# Patient Record
Sex: Female | Born: 1935 | Race: White | Hispanic: No | Smoking: Former smoker
Health system: Southern US, Community
[De-identification: ages and names within clinical notes are randomized; demographics above are authoritative.]

## PROBLEM LIST (undated history)

## (undated) DIAGNOSIS — E785 Hyperlipidemia, unspecified: Secondary | ICD-10-CM

## (undated) DIAGNOSIS — H269 Unspecified cataract: Secondary | ICD-10-CM

## (undated) DIAGNOSIS — N393 Stress incontinence (female) (male): Secondary | ICD-10-CM

## (undated) DIAGNOSIS — N3020 Other chronic cystitis without hematuria: Secondary | ICD-10-CM

## (undated) DIAGNOSIS — I4891 Unspecified atrial fibrillation: Secondary | ICD-10-CM

## (undated) DIAGNOSIS — K921 Melena: Secondary | ICD-10-CM

## (undated) DIAGNOSIS — I2510 Atherosclerotic heart disease of native coronary artery without angina pectoris: Secondary | ICD-10-CM

## (undated) DIAGNOSIS — N183 Chronic kidney disease, stage 3 unspecified: Secondary | ICD-10-CM

## (undated) DIAGNOSIS — K219 Gastro-esophageal reflux disease without esophagitis: Secondary | ICD-10-CM

## (undated) DIAGNOSIS — T8859XA Other complications of anesthesia, initial encounter: Secondary | ICD-10-CM

## (undated) DIAGNOSIS — I10 Essential (primary) hypertension: Secondary | ICD-10-CM

## (undated) DIAGNOSIS — I739 Peripheral vascular disease, unspecified: Secondary | ICD-10-CM

## (undated) DIAGNOSIS — K5520 Angiodysplasia of colon without hemorrhage: Secondary | ICD-10-CM

## (undated) DIAGNOSIS — D500 Iron deficiency anemia secondary to blood loss (chronic): Secondary | ICD-10-CM

## (undated) DIAGNOSIS — F329 Major depressive disorder, single episode, unspecified: Secondary | ICD-10-CM

## (undated) DIAGNOSIS — D509 Iron deficiency anemia, unspecified: Secondary | ICD-10-CM

## (undated) DIAGNOSIS — I779 Disorder of arteries and arterioles, unspecified: Secondary | ICD-10-CM

## (undated) DIAGNOSIS — E119 Type 2 diabetes mellitus without complications: Secondary | ICD-10-CM

## (undated) DIAGNOSIS — Z972 Presence of dental prosthetic device (complete) (partial): Secondary | ICD-10-CM

## (undated) DIAGNOSIS — F32A Depression, unspecified: Secondary | ICD-10-CM

## (undated) DIAGNOSIS — M1990 Unspecified osteoarthritis, unspecified site: Secondary | ICD-10-CM

## (undated) DIAGNOSIS — T4145XA Adverse effect of unspecified anesthetic, initial encounter: Secondary | ICD-10-CM

## (undated) HISTORY — DX: Hyperlipidemia, unspecified: E78.5

## (undated) HISTORY — DX: Angiodysplasia of colon without hemorrhage: K55.20

## (undated) HISTORY — DX: Disorder of arteries and arterioles, unspecified: I77.9

## (undated) HISTORY — DX: Essential (primary) hypertension: I10

## (undated) HISTORY — DX: Iron deficiency anemia, unspecified: D50.9

## (undated) HISTORY — DX: Melena: K92.1

## (undated) HISTORY — DX: Unspecified cataract: H26.9

## (undated) HISTORY — DX: Gastro-esophageal reflux disease without esophagitis: K21.9

## (undated) HISTORY — DX: Stress incontinence (female) (male): N39.3

## (undated) HISTORY — PX: CARPAL TUNNEL RELEASE: SHX101

## (undated) HISTORY — DX: Iron deficiency anemia secondary to blood loss (chronic): D50.0

## (undated) HISTORY — DX: Other chronic cystitis without hematuria: N30.20

## (undated) HISTORY — DX: Depression, unspecified: F32.A

## (undated) HISTORY — DX: Major depressive disorder, single episode, unspecified: F32.9

## (undated) HISTORY — DX: Type 2 diabetes mellitus without complications: E11.9

## (undated) HISTORY — DX: Unspecified osteoarthritis, unspecified site: M19.90

## (undated) HISTORY — DX: Atherosclerotic heart disease of native coronary artery without angina pectoris: I25.10

## (undated) HISTORY — DX: Peripheral vascular disease, unspecified: I73.9

## (undated) MED FILL — Ferumoxytol Inj 510 MG/17ML (30 MG/ML) (Elemental Fe): INTRAVENOUS | Qty: 17 | Status: AC

---
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
## 1970-11-19 HISTORY — PX: ABDOMINAL HYSTERECTOMY: SHX81

## 1990-11-19 HISTORY — PX: APPENDECTOMY: SHX54

## 1991-11-20 HISTORY — PX: CHOLECYSTECTOMY: SHX55

## 2001-02-17 HISTORY — PX: CARDIAC CATHETERIZATION: SHX172

## 2002-09-19 HISTORY — PX: CAROTID ARTERY ANGIOPLASTY: SHX1300

## 2004-12-13 ENCOUNTER — Ambulatory Visit: Payer: Self-pay | Admitting: Specialist

## 2005-01-10 ENCOUNTER — Ambulatory Visit: Payer: Self-pay | Admitting: Internal Medicine

## 2005-01-17 ENCOUNTER — Ambulatory Visit: Payer: Self-pay | Admitting: Internal Medicine

## 2005-02-17 ENCOUNTER — Ambulatory Visit: Payer: Self-pay | Admitting: Internal Medicine

## 2005-03-19 ENCOUNTER — Ambulatory Visit: Payer: Self-pay | Admitting: Internal Medicine

## 2005-06-18 ENCOUNTER — Emergency Department: Payer: Self-pay | Admitting: Emergency Medicine

## 2005-06-18 ENCOUNTER — Other Ambulatory Visit: Payer: Self-pay

## 2005-06-20 ENCOUNTER — Inpatient Hospital Stay: Payer: Self-pay | Admitting: Internal Medicine

## 2005-07-26 ENCOUNTER — Ambulatory Visit: Payer: Self-pay | Admitting: Internal Medicine

## 2005-08-03 ENCOUNTER — Other Ambulatory Visit: Payer: Self-pay

## 2005-08-04 ENCOUNTER — Inpatient Hospital Stay: Payer: Self-pay | Admitting: Internal Medicine

## 2005-09-20 ENCOUNTER — Ambulatory Visit: Payer: Self-pay | Admitting: Internal Medicine

## 2005-10-25 ENCOUNTER — Ambulatory Visit: Payer: Self-pay | Admitting: Internal Medicine

## 2005-10-31 ENCOUNTER — Ambulatory Visit: Payer: Self-pay | Admitting: Unknown Physician Specialty

## 2005-11-19 ENCOUNTER — Ambulatory Visit: Payer: Self-pay | Admitting: Internal Medicine

## 2005-12-27 ENCOUNTER — Ambulatory Visit: Payer: Self-pay | Admitting: Internal Medicine

## 2006-01-01 ENCOUNTER — Ambulatory Visit: Payer: Self-pay | Admitting: Internal Medicine

## 2006-01-17 ENCOUNTER — Ambulatory Visit: Payer: Self-pay | Admitting: Internal Medicine

## 2006-09-16 ENCOUNTER — Other Ambulatory Visit: Payer: Self-pay

## 2006-09-26 ENCOUNTER — Other Ambulatory Visit: Payer: Self-pay

## 2006-09-26 ENCOUNTER — Inpatient Hospital Stay: Payer: Self-pay | Admitting: Unknown Physician Specialty

## 2006-09-27 ENCOUNTER — Other Ambulatory Visit: Payer: Self-pay

## 2007-01-29 ENCOUNTER — Ambulatory Visit: Payer: Self-pay | Admitting: Internal Medicine

## 2007-06-20 HISTORY — PX: FOOT SURGERY: SHX648

## 2007-07-09 ENCOUNTER — Ambulatory Visit: Payer: Self-pay | Admitting: Podiatry

## 2008-04-08 ENCOUNTER — Ambulatory Visit: Payer: Self-pay | Admitting: Unknown Physician Specialty

## 2008-04-21 ENCOUNTER — Ambulatory Visit: Payer: Self-pay | Admitting: Internal Medicine

## 2008-05-12 ENCOUNTER — Ambulatory Visit: Payer: Self-pay | Admitting: Pain Medicine

## 2008-05-27 ENCOUNTER — Ambulatory Visit: Payer: Self-pay | Admitting: Pain Medicine

## 2008-06-10 ENCOUNTER — Ambulatory Visit: Payer: Self-pay | Admitting: Physician Assistant

## 2008-06-19 LAB — HM DIABETES FOOT EXAM

## 2008-07-29 ENCOUNTER — Ambulatory Visit: Payer: Self-pay | Admitting: Unknown Physician Specialty

## 2008-09-10 ENCOUNTER — Ambulatory Visit: Payer: Self-pay | Admitting: Unknown Physician Specialty

## 2008-09-20 ENCOUNTER — Ambulatory Visit: Payer: Self-pay | Admitting: Pain Medicine

## 2008-09-23 ENCOUNTER — Ambulatory Visit: Payer: Self-pay | Admitting: Pain Medicine

## 2008-10-20 ENCOUNTER — Ambulatory Visit: Payer: Self-pay | Admitting: Physician Assistant

## 2008-11-24 ENCOUNTER — Ambulatory Visit: Payer: Self-pay | Admitting: Internal Medicine

## 2008-12-16 ENCOUNTER — Ambulatory Visit: Payer: Self-pay | Admitting: Internal Medicine

## 2008-12-20 ENCOUNTER — Ambulatory Visit: Payer: Self-pay | Admitting: Internal Medicine

## 2009-01-11 ENCOUNTER — Ambulatory Visit: Payer: Self-pay | Admitting: Internal Medicine

## 2009-01-17 ENCOUNTER — Ambulatory Visit: Payer: Self-pay | Admitting: Internal Medicine

## 2009-02-22 ENCOUNTER — Ambulatory Visit: Payer: Self-pay | Admitting: Internal Medicine

## 2009-03-19 ENCOUNTER — Ambulatory Visit: Payer: Self-pay | Admitting: Internal Medicine

## 2009-04-19 ENCOUNTER — Ambulatory Visit: Payer: Self-pay | Admitting: Internal Medicine

## 2009-04-26 ENCOUNTER — Ambulatory Visit: Payer: Self-pay | Admitting: Urology

## 2009-05-05 ENCOUNTER — Ambulatory Visit: Payer: Self-pay | Admitting: Urology

## 2009-05-19 ENCOUNTER — Ambulatory Visit: Payer: Self-pay | Admitting: Urology

## 2009-06-09 ENCOUNTER — Ambulatory Visit: Payer: Self-pay | Admitting: Internal Medicine

## 2009-06-14 ENCOUNTER — Ambulatory Visit: Payer: Self-pay | Admitting: Pain Medicine

## 2009-06-17 ENCOUNTER — Ambulatory Visit: Payer: Self-pay | Admitting: Internal Medicine

## 2009-06-19 ENCOUNTER — Ambulatory Visit: Payer: Self-pay | Admitting: Internal Medicine

## 2009-06-28 ENCOUNTER — Ambulatory Visit: Payer: Self-pay | Admitting: Physician Assistant

## 2009-06-29 ENCOUNTER — Ambulatory Visit: Payer: Self-pay | Admitting: Surgery

## 2009-07-04 ENCOUNTER — Ambulatory Visit: Payer: Self-pay | Admitting: Internal Medicine

## 2009-07-20 ENCOUNTER — Ambulatory Visit: Payer: Self-pay | Admitting: Internal Medicine

## 2009-09-19 ENCOUNTER — Ambulatory Visit: Payer: Self-pay | Admitting: Internal Medicine

## 2009-10-04 ENCOUNTER — Ambulatory Visit: Payer: Self-pay | Admitting: Internal Medicine

## 2009-10-19 ENCOUNTER — Ambulatory Visit: Payer: Self-pay | Admitting: Internal Medicine

## 2009-11-22 ENCOUNTER — Ambulatory Visit: Payer: Self-pay | Admitting: Pain Medicine

## 2009-12-05 ENCOUNTER — Ambulatory Visit: Payer: Self-pay | Admitting: Pain Medicine

## 2009-12-20 ENCOUNTER — Ambulatory Visit: Payer: Self-pay | Admitting: Internal Medicine

## 2010-01-04 ENCOUNTER — Ambulatory Visit: Payer: Self-pay | Admitting: Internal Medicine

## 2010-01-17 ENCOUNTER — Ambulatory Visit: Payer: Self-pay | Admitting: Internal Medicine

## 2010-02-17 ENCOUNTER — Ambulatory Visit: Payer: Self-pay | Admitting: Internal Medicine

## 2010-03-07 ENCOUNTER — Ambulatory Visit: Payer: Self-pay | Admitting: Pain Medicine

## 2010-03-19 ENCOUNTER — Ambulatory Visit: Payer: Self-pay | Admitting: Internal Medicine

## 2010-04-06 ENCOUNTER — Ambulatory Visit: Payer: Self-pay | Admitting: Pain Medicine

## 2010-04-13 ENCOUNTER — Ambulatory Visit: Payer: Self-pay | Admitting: Pain Medicine

## 2010-04-20 ENCOUNTER — Ambulatory Visit: Payer: Self-pay | Admitting: Pain Medicine

## 2010-05-31 ENCOUNTER — Ambulatory Visit: Payer: Self-pay | Admitting: Unknown Physician Specialty

## 2010-06-01 ENCOUNTER — Ambulatory Visit: Payer: Self-pay | Admitting: Internal Medicine

## 2010-06-06 ENCOUNTER — Ambulatory Visit: Payer: Self-pay | Admitting: Unknown Physician Specialty

## 2010-06-08 ENCOUNTER — Inpatient Hospital Stay: Payer: Self-pay | Admitting: Unknown Physician Specialty

## 2010-06-08 HISTORY — PX: BACK SURGERY: SHX140

## 2010-07-28 ENCOUNTER — Ambulatory Visit: Payer: Self-pay | Admitting: Internal Medicine

## 2010-08-19 ENCOUNTER — Ambulatory Visit: Payer: Self-pay | Admitting: Internal Medicine

## 2010-09-28 ENCOUNTER — Ambulatory Visit: Payer: Self-pay | Admitting: Internal Medicine

## 2010-11-14 ENCOUNTER — Ambulatory Visit: Payer: Self-pay | Admitting: Internal Medicine

## 2010-11-19 ENCOUNTER — Ambulatory Visit: Payer: Self-pay | Admitting: Internal Medicine

## 2010-12-20 ENCOUNTER — Ambulatory Visit: Payer: Self-pay | Admitting: Internal Medicine

## 2010-12-29 ENCOUNTER — Ambulatory Visit: Payer: Self-pay | Admitting: Unknown Physician Specialty

## 2011-01-10 ENCOUNTER — Ambulatory Visit: Payer: Self-pay | Admitting: Internal Medicine

## 2011-01-18 ENCOUNTER — Ambulatory Visit: Payer: Self-pay | Admitting: Internal Medicine

## 2011-01-22 ENCOUNTER — Ambulatory Visit: Payer: Self-pay | Admitting: Unknown Physician Specialty

## 2011-01-22 LAB — HM COLONOSCOPY

## 2011-03-16 ENCOUNTER — Ambulatory Visit: Payer: Self-pay | Admitting: Internal Medicine

## 2011-03-20 ENCOUNTER — Ambulatory Visit: Payer: Self-pay | Admitting: Internal Medicine

## 2011-04-23 ENCOUNTER — Ambulatory Visit: Payer: Self-pay | Admitting: Internal Medicine

## 2011-04-26 ENCOUNTER — Ambulatory Visit: Payer: Self-pay | Admitting: Unknown Physician Specialty

## 2011-05-03 ENCOUNTER — Inpatient Hospital Stay: Payer: Self-pay | Admitting: Unknown Physician Specialty

## 2011-05-03 HISTORY — PX: TOTAL HIP ARTHROPLASTY: SHX124

## 2011-05-04 LAB — PATHOLOGY REPORT

## 2011-06-08 ENCOUNTER — Ambulatory Visit: Payer: Self-pay | Admitting: Internal Medicine

## 2011-06-20 ENCOUNTER — Ambulatory Visit: Payer: Self-pay | Admitting: Internal Medicine

## 2011-07-21 ENCOUNTER — Ambulatory Visit: Payer: Self-pay | Admitting: Internal Medicine

## 2011-08-31 ENCOUNTER — Ambulatory Visit: Payer: Self-pay | Admitting: Internal Medicine

## 2011-09-17 ENCOUNTER — Ambulatory Visit: Payer: Self-pay | Admitting: Internal Medicine

## 2011-09-20 ENCOUNTER — Ambulatory Visit: Payer: Self-pay | Admitting: Internal Medicine

## 2011-10-09 ENCOUNTER — Other Ambulatory Visit: Payer: Self-pay | Admitting: Vascular Surgery

## 2011-11-23 ENCOUNTER — Ambulatory Visit: Payer: Self-pay | Admitting: Internal Medicine

## 2011-11-23 LAB — CANCER CENTER HEMOGLOBIN: HGB: 11.7 g/dL — ABNORMAL LOW (ref 12.0–16.0)

## 2011-11-23 LAB — IRON AND TIBC
Iron Bind.Cap.(Total): 427 ug/dL (ref 250–450)
Iron Saturation: 13 %
Unbound Iron-Bind.Cap.: 372 ug/dL

## 2011-12-20 ENCOUNTER — Emergency Department: Payer: Self-pay | Admitting: Internal Medicine

## 2011-12-20 LAB — COMPREHENSIVE METABOLIC PANEL
Albumin: 3.9 g/dL (ref 3.4–5.0)
Anion Gap: 10 (ref 7–16)
BUN: 22 mg/dL — ABNORMAL HIGH (ref 7–18)
Chloride: 104 mmol/L (ref 98–107)
Creatinine: 1.05 mg/dL (ref 0.60–1.30)
EGFR (African American): >60
EGFR (Non-African Amer.): 54 — ABNORMAL LOW
Glucose: 130 mg/dL — ABNORMAL HIGH (ref 65–99)
Potassium: 4.1 mmol/L (ref 3.5–5.1)
Sodium: 143 mmol/L (ref 136–145)
Total Protein: 7.2 g/dL (ref 6.4–8.2)

## 2011-12-20 LAB — CBC
HCT: 37.1 % (ref 35.0–47.0)
HGB: 12.4 g/dL (ref 12.0–16.0)
Platelet: 259 10*3/uL (ref 150–440)
RDW: 16.7 % — ABNORMAL HIGH (ref 11.5–14.5)
WBC: 11 10*3/uL (ref 3.6–11.0)

## 2011-12-21 ENCOUNTER — Ambulatory Visit: Payer: Self-pay | Admitting: Internal Medicine

## 2012-02-20 ENCOUNTER — Ambulatory Visit: Payer: Self-pay | Admitting: Internal Medicine

## 2012-02-20 LAB — CBC CANCER CENTER
Basophil #: 0.1 x10 3/mm (ref 0.0–0.1)
Basophil %: 1 %
Eosinophil #: 0.3 x10 3/mm (ref 0.0–0.7)
Eosinophil %: 4.7 %
HCT: 33.2 % — ABNORMAL LOW (ref 35.0–47.0)
HGB: 11.3 g/dL — ABNORMAL LOW (ref 12.0–16.0)
Lymphocyte #: 2.1 x10 3/mm (ref 1.0–3.6)
MCH: 30.7 pg (ref 26.0–34.0)
MCHC: 34.1 g/dL (ref 32.0–36.0)
MCV: 90 fL (ref 80–100)
Monocyte %: 8.2 %
Neutrophil #: 3.2 x10 3/mm (ref 1.4–6.5)
Platelet: 214 x10 3/mm (ref 150–440)
RBC: 3.68 10*6/uL — ABNORMAL LOW (ref 3.80–5.20)
WBC: 6.2 x10 3/mm (ref 3.6–11.0)

## 2012-02-20 LAB — IRON AND TIBC
Iron Bind.Cap.(Total): 407 ug/dL (ref 250–450)
Iron Saturation: 12 %

## 2012-03-19 ENCOUNTER — Ambulatory Visit: Payer: Self-pay | Admitting: Internal Medicine

## 2012-04-19 ENCOUNTER — Ambulatory Visit: Payer: Self-pay | Admitting: Internal Medicine

## 2012-05-06 ENCOUNTER — Ambulatory Visit: Payer: Self-pay | Admitting: Vascular Surgery

## 2012-05-09 ENCOUNTER — Ambulatory Visit: Payer: Self-pay | Admitting: Internal Medicine

## 2012-05-23 ENCOUNTER — Ambulatory Visit: Payer: Self-pay | Admitting: Internal Medicine

## 2012-05-23 LAB — IRON AND TIBC: Iron: 89 ug/dL (ref 50–170)

## 2012-06-19 ENCOUNTER — Ambulatory Visit: Payer: Self-pay | Admitting: Internal Medicine

## 2012-08-15 ENCOUNTER — Ambulatory Visit: Payer: Self-pay | Admitting: Internal Medicine

## 2012-08-15 LAB — CBC CANCER CENTER
Basophil #: 0.1 x10 3/mm (ref 0.0–0.1)
Basophil %: 1.2 %
Eosinophil #: 0.3 x10 3/mm (ref 0.0–0.7)
HCT: 36.9 % (ref 35.0–47.0)
HGB: 12.1 g/dL (ref 12.0–16.0)
Lymphocyte %: 28.7 %
MCHC: 32.7 g/dL (ref 32.0–36.0)
Monocyte #: 0.5 x10 3/mm (ref 0.2–0.9)
Monocyte %: 6.1 %
Neutrophil #: 5.3 x10 3/mm (ref 1.4–6.5)
Neutrophil %: 61 %
RDW: 13 % (ref 11.5–14.5)
WBC: 8.6 x10 3/mm (ref 3.6–11.0)

## 2012-08-15 LAB — IRON AND TIBC
Iron Bind.Cap.(Total): 392 ug/dL (ref 250–450)
Iron: 65 ug/dL (ref 50–170)
Unbound Iron-Bind.Cap.: 327 ug/dL

## 2012-08-19 ENCOUNTER — Ambulatory Visit: Payer: Self-pay | Admitting: Internal Medicine

## 2012-08-21 LAB — HM DIABETES EYE EXAM

## 2012-09-04 ENCOUNTER — Encounter: Payer: Self-pay | Admitting: Internal Medicine

## 2012-09-04 ENCOUNTER — Ambulatory Visit (INDEPENDENT_AMBULATORY_CARE_PROVIDER_SITE_OTHER): Payer: 59 | Admitting: Internal Medicine

## 2012-09-04 VITALS — BP 140/80 | HR 73 | Temp 98.1°F | Resp 15 | Ht 60.0 in | Wt 142.0 lb

## 2012-09-04 DIAGNOSIS — I2510 Atherosclerotic heart disease of native coronary artery without angina pectoris: Secondary | ICD-10-CM

## 2012-09-04 DIAGNOSIS — I779 Disorder of arteries and arterioles, unspecified: Secondary | ICD-10-CM

## 2012-09-04 DIAGNOSIS — I10 Essential (primary) hypertension: Secondary | ICD-10-CM

## 2012-09-04 DIAGNOSIS — E7800 Pure hypercholesterolemia, unspecified: Secondary | ICD-10-CM

## 2012-09-04 DIAGNOSIS — D649 Anemia, unspecified: Secondary | ICD-10-CM

## 2012-09-04 DIAGNOSIS — E119 Type 2 diabetes mellitus without complications: Secondary | ICD-10-CM

## 2012-09-04 MED ORDER — RAMIPRIL 10 MG PO CAPS: 10.0000 mg | ORAL_CAPSULE | Freq: Every day | ORAL | Status: DC

## 2012-09-04 MED ORDER — ATORVASTATIN CALCIUM 40 MG PO TABS: 40.0000 mg | ORAL_TABLET | Freq: Every day | ORAL | Status: DC

## 2012-09-04 MED ORDER — HYDROCHLOROTHIAZIDE 12.5 MG PO CAPS: 12.5000 mg | ORAL_CAPSULE | Freq: Every day | ORAL | Status: DC

## 2012-09-04 NOTE — Patient Instructions (Signed)
It was nice seeing you today.  I am glad you are doing well.  We will schedule your labs and your follow up appt  - today.

## 2012-09-05 ENCOUNTER — Encounter: Payer: Self-pay | Admitting: Internal Medicine

## 2012-09-05 DIAGNOSIS — I25119 Atherosclerotic heart disease of native coronary artery with unspecified angina pectoris: Secondary | ICD-10-CM | POA: Insufficient documentation

## 2012-09-05 DIAGNOSIS — E119 Type 2 diabetes mellitus without complications: Secondary | ICD-10-CM | POA: Insufficient documentation

## 2012-09-05 DIAGNOSIS — E7800 Pure hypercholesterolemia, unspecified: Secondary | ICD-10-CM | POA: Insufficient documentation

## 2012-09-05 DIAGNOSIS — D649 Anemia, unspecified: Secondary | ICD-10-CM | POA: Insufficient documentation

## 2012-09-05 DIAGNOSIS — I2510 Atherosclerotic heart disease of native coronary artery without angina pectoris: Secondary | ICD-10-CM | POA: Insufficient documentation

## 2012-09-05 DIAGNOSIS — I10 Essential (primary) hypertension: Secondary | ICD-10-CM | POA: Insufficient documentation

## 2012-09-05 DIAGNOSIS — I779 Disorder of arteries and arterioles, unspecified: Secondary | ICD-10-CM | POA: Insufficient documentation

## 2012-09-05 MED ORDER — HYDROCHLOROTHIAZIDE 12.5 MG PO CAPS: 12.5000 mg | ORAL_CAPSULE | Freq: Every day | ORAL | Status: DC

## 2012-09-05 NOTE — Progress Notes (Signed)
  Subjective:    Patient ID: Jocelyn Elliott, female    DOB: 1932-09-18, 78 y.o.   MRN: [id number]  HPI 78 year old female with past history of CAD, carotid disease s/p carotid endarterectomy, hypertension, hypercholesterolemia and diabetes.  She comes in today for a scheduled follow up.  States she feels good and has been doing well.  She is seeing Dr Ma Hillock every three months.  He is following her hgb.  Receive IV iron every three months.  Due follow up 11/07/12.  She is also seeing Dr Ronalee Belts for her carotid disease.  He is following the right carotid now.  Has follow up planned 12/13. Back and hip are doing well.  Left hip may bother her a little when she is getting in and out of the car, but overall is doing well.  No significant pain.  Saw Dr Saralyn Pilar two months ago.  Doing well.  Her blood pressures have been averaging 120-130s/60.  Sugars (am) - 80s-120s.    Past Medical History  Diagnosis Date  . Arthritis   . Depression   . Diabetes mellitus   . Hypertension   . Hyperlipidemia   . Blood in stool   . CAD (coronary artery disease)   . Carotid arterial disease     Review of Systems Patient denies any headache, lightheadedness or dizziness.  No significant allergy or sinus symptoms.  No chest pain, tightness or palpatations.  No increased shortness of breath, cough or congestion.  No acid reflux, dysphagia or odynophagia.  No nausea or vomiting.  No abdominal pain or cramping.  No bowel change, such as diarrhea, constipation, BRBPR or melana.  No urine change.  Overall feels she is doing well.        Objective:   Physical Exam Filed Vitals:   09/04/12 1045  BP: 140/80  Pulse: 73  Temp: 98.1 F (36.7 C)  Resp: 15   Blood pressure recheck:  14/46  78 year old female in no acute distress.   HEENT:  Nares - clear.  OP- without lesions or erythema.  NECK:  Supple, nontender.  Right carotid bruit.    HEART:  Appears to be regular. LUNGS:  Without crackles or wheezing audible.   Respirations even and unlabored.   RADIAL PULSE:  Equal bilaterally.  ABDOMEN:  Soft, nontender.  No audible abdominal bruit.   EXTREMITIES:  No increased edema to be present.                    Assessment & Plan:  GI.  No further rectal bleeding.  Had a colonoscopy within the last couple of years.  Sees Dr Tiffany Kocher.  On Prilosec and doing well.    HEALTH MAINTENANCE.  Last physical 04/11/12.  Had a colonoscopy within the last two years.  (Dr Tiffany Kocher).  Per report - up to date with her mammogram.  Has already had her flu shot.

## 2012-09-05 NOTE — Assessment & Plan Note (Signed)
Blood pressure is under good control.  Same meds.  Check metabolic panel.     

## 2012-09-05 NOTE — Assessment & Plan Note (Signed)
Continue on Lipitor.  Low cholesterol diet and exercise.  Follow lipid panel and liver function.

## 2012-09-05 NOTE — Assessment & Plan Note (Signed)
Currently doing well.  Sees Dr Saralyn Pilar.  Continue risk factor modification.

## 2012-09-05 NOTE — Assessment & Plan Note (Signed)
Sugars have been under good control.  Low carb diet.  Continues on Metformin.  Check met b and a1c with next fasting labs.

## 2012-09-05 NOTE — Assessment & Plan Note (Signed)
Seeing Dr Delana Meyer.  Continue daily aspirin.  Continue risk factor modification.

## 2012-09-05 NOTE — Assessment & Plan Note (Signed)
Has a history of iron deficiency.  Sees Dr Ma Hillock.  Receives IV iron q 3 months.  He is following her hgb.

## 2012-09-18 ENCOUNTER — Other Ambulatory Visit (INDEPENDENT_AMBULATORY_CARE_PROVIDER_SITE_OTHER): Payer: 59

## 2012-09-18 DIAGNOSIS — I10 Essential (primary) hypertension: Secondary | ICD-10-CM

## 2012-09-18 DIAGNOSIS — E7800 Pure hypercholesterolemia, unspecified: Secondary | ICD-10-CM

## 2012-09-18 DIAGNOSIS — E119 Type 2 diabetes mellitus without complications: Secondary | ICD-10-CM

## 2012-09-18 LAB — HEPATIC FUNCTION PANEL
ALT: 28 U/L (ref 0–35)
Bilirubin, Direct: 0.1 mg/dL (ref 0.0–0.3)
Total Bilirubin: 0.3 mg/dL (ref 0.3–1.2)

## 2012-09-18 LAB — LIPID PANEL
Triglycerides: 211 mg/dL — ABNORMAL HIGH (ref 0.0–149.0)
VLDL: 42.2 mg/dL — ABNORMAL HIGH (ref 0.0–40.0)

## 2012-09-19 LAB — BASIC METABOLIC PANEL WITH GFR
Chloride: 106 mEq/L (ref 96–112)
GFR, Est African American: 54 mL/min — ABNORMAL LOW
GFR, Est Non African American: 47 mL/min — ABNORMAL LOW
Glucose, Bld: 130 mg/dL — ABNORMAL HIGH (ref 70–99)
Potassium: 4.5 mEq/L (ref 3.5–5.3)
Sodium: 139 mEq/L (ref 135–145)

## 2012-09-23 ENCOUNTER — Encounter: Payer: Self-pay | Admitting: *Deleted

## 2012-09-23 NOTE — Progress Notes (Signed)
Result letter mailed to patient's home address.

## 2012-10-21 ENCOUNTER — Ambulatory Visit: Payer: Self-pay | Admitting: Internal Medicine

## 2012-10-21 LAB — IRON AND TIBC
Iron Bind.Cap.(Total): 413 ug/dL (ref 250–450)
Iron: 37 ug/dL — ABNORMAL LOW (ref 50–170)
Unbound Iron-Bind.Cap.: 376 ug/dL

## 2012-10-21 LAB — CANCER CENTER HEMOGLOBIN: HGB: 10 g/dL — ABNORMAL LOW (ref 12.0–16.0)

## 2012-11-03 ENCOUNTER — Telehealth: Payer: Self-pay | Admitting: Internal Medicine

## 2012-11-03 NOTE — Telephone Encounter (Signed)
Refill - Pharmacy -  [facility] [address] [place] Fax # [phone] Drug Name-  Hydrochlorothiazide 12.5 mg Directions- take 1 capsule by mouth daily Quantity- 30

## 2012-11-04 ENCOUNTER — Other Ambulatory Visit: Payer: Self-pay | Admitting: *Deleted

## 2012-11-04 MED ORDER — HYDROCHLOROTHIAZIDE 12.5 MG PO CAPS: 12.5000 mg | ORAL_CAPSULE | Freq: Every day | ORAL | Status: DC

## 2012-11-04 NOTE — Progress Notes (Signed)
Filled script for microdize

## 2012-11-17 LAB — CANCER CENTER HEMOGLOBIN: HGB: 12.3 g/dL (ref 12.0–16.0)

## 2012-11-19 ENCOUNTER — Ambulatory Visit: Payer: Self-pay | Admitting: Internal Medicine

## 2012-11-25 ENCOUNTER — Ambulatory Visit: Payer: Self-pay | Admitting: Vascular Surgery

## 2012-12-16 ENCOUNTER — Telehealth: Payer: Self-pay | Admitting: Internal Medicine

## 2012-12-16 NOTE — Telephone Encounter (Signed)
Called patient back to find out how she takes her metformin.

## 2012-12-16 NOTE — Telephone Encounter (Signed)
Pt returning call from [place].  Pt states she will be home all day [phone], please call.

## 2012-12-31 ENCOUNTER — Ambulatory Visit: Payer: Self-pay | Admitting: Vascular Surgery

## 2012-12-31 LAB — BASIC METABOLIC PANEL
Anion Gap: 5 — ABNORMAL LOW (ref 7–16)
Calcium, Total: 9.4 mg/dL (ref 8.5–10.1)
Co2: 30 mmol/L (ref 21–32)
Creatinine: 1.03 mg/dL (ref 0.60–1.30)
Glucose: 100 mg/dL — ABNORMAL HIGH (ref 65–99)
Osmolality: 286 (ref 275–301)
Potassium: 3.9 mmol/L (ref 3.5–5.1)
Sodium: 139 mmol/L (ref 136–145)

## 2012-12-31 LAB — CBC
HCT: 36.1 % (ref 35.0–47.0)
HGB: 12.3 g/dL (ref 12.0–16.0)
MCH: 32.4 pg (ref 26.0–34.0)
MCHC: 34.2 g/dL (ref 32.0–36.0)
RDW: 16.8 % — ABNORMAL HIGH (ref 11.5–14.5)
WBC: 7.2 10*3/uL (ref 3.6–11.0)

## 2013-01-05 ENCOUNTER — Encounter: Payer: Self-pay | Admitting: Internal Medicine

## 2013-01-05 ENCOUNTER — Ambulatory Visit (INDEPENDENT_AMBULATORY_CARE_PROVIDER_SITE_OTHER): Payer: 59 | Admitting: Internal Medicine

## 2013-01-05 VITALS — BP 130/60 | HR 72 | Temp 97.5°F | Ht 60.0 in | Wt 144.5 lb

## 2013-01-05 DIAGNOSIS — E7800 Pure hypercholesterolemia, unspecified: Secondary | ICD-10-CM

## 2013-01-05 DIAGNOSIS — I779 Disorder of arteries and arterioles, unspecified: Secondary | ICD-10-CM

## 2013-01-05 DIAGNOSIS — I10 Essential (primary) hypertension: Secondary | ICD-10-CM

## 2013-01-05 DIAGNOSIS — E119 Type 2 diabetes mellitus without complications: Secondary | ICD-10-CM

## 2013-01-05 DIAGNOSIS — D649 Anemia, unspecified: Secondary | ICD-10-CM

## 2013-01-05 DIAGNOSIS — I2510 Atherosclerotic heart disease of native coronary artery without angina pectoris: Secondary | ICD-10-CM

## 2013-01-06 ENCOUNTER — Encounter: Payer: Self-pay | Admitting: Internal Medicine

## 2013-01-06 NOTE — Assessment & Plan Note (Signed)
Blood pressure under good control.  Continue same medication regimen.  Obtain recent labs.  Stay hydrated.  Will need close intra op and post op monitoring of her heart rate and blood pressure to avoid extremes.

## 2013-01-06 NOTE — Assessment & Plan Note (Signed)
Obtain recent labs to confirm stable (and at a good level) prior to surgery.

## 2013-01-06 NOTE — Assessment & Plan Note (Signed)
Seeing Dr Delana Meyer.  Planning for carotid surgery at the end of this week.

## 2013-01-06 NOTE — Assessment & Plan Note (Signed)
States she recently saw Dr Saralyn Pilar.  Will need to refer to his note for cardiac recommendations and clearance prior to surgery.

## 2013-01-06 NOTE — Progress Notes (Signed)
Subjective:    Patient ID: Jocelyn Elliott, female    DOB: 12-24-1931, 79 y.o.   MRN: [id number]  HPI 79 year old female with past history of CAD, carotid disease s/p carotid endarterectomy, hypertension, hypercholesterolemia and diabetes.  She comes in today for a scheduled follow up.  States she feels good and has been doing well.  She is seeing Dr Ma Hillock every three months.  He is following her hgb.  Receives IV iron.  States in November her hgb was 9.  Received four treatments.  Just had her blood count checked last week.  Unsure of the results.  She is also seeing Dr Ronalee Belts for her carotid disease.  Is planning to have carotid surgery 01/09/13.  States she had her pre op last week.  Blood drawn.  She also saw Dr Saralyn Pilar.  States he saw her for cardiology clearance.  Overall she feels she is doing well.  Her blood pressures have been doing well.  Sugars (am)  - 90s-120s.    Past Medical History  Diagnosis Date  . Arthritis   . Depression   . Diabetes mellitus   . Hypertension   . Hyperlipidemia   . Blood in stool   . CAD (coronary artery disease)   . Carotid arterial disease     Current Outpatient Prescriptions on File Prior to Visit  Medication Sig Dispense Refill  . atorvastatin (LIPITOR) 40 MG tablet Take 1 tablet (40 mg total) by mouth daily. 1 tablet and then alternate next day with 20mg  (1/2 tablet)  90 tablet  3  . calcium carbonate (OS-CAL) 600 MG TABS Take 600 mg by mouth daily.      . cetirizine (ZYRTEC) 10 MG tablet Take 10 mg by mouth daily.      . hydrochlorothiazide (MICROZIDE) 12.5 MG capsule Take 1 capsule (12.5 mg total) by mouth daily.  90 capsule  3  . metFORMIN (GLUCOPHAGE-XR) 500 MG 24 hr tablet Take 500 mg by mouth daily.       . Omega-3 Fatty Acids (FISH OIL) 1200 MG CAPS Take 1 capsule by mouth daily.      Marland Kitchen omeprazole (PRILOSEC) 20 MG capsule Take 20 mg by mouth daily.      . ramipril (ALTACE) 10 MG capsule Take 1 capsule (10 mg total) by mouth daily.  90  capsule  3  . TOPROL XL 100 MG 24 hr tablet Take 100 mg by mouth 2 (two) times daily.       . vitamin C (ASCORBIC ACID) 500 MG tablet Take 500 mg by mouth daily.      Marland Kitchen aspirin 325 MG tablet Take 325 mg by mouth daily.       No current facility-administered medications on file prior to visit.    Review of Systems Patient denies any headache, lightheadedness or dizziness.  No significant allergy or sinus symptoms.  No chest pain, tightness or palpitations.  No increased shortness of breath, cough or congestion.  No acid reflux, dysphagia or odynophagia.  No nausea or vomiting.  No abdominal pain or cramping.  No bowel change, such as diarrhea, constipation, BRBPR or melana.  No urine change.  Overall feels she is doing well.        Objective:   Physical Exam  Filed Vitals:   01/05/13 1005  BP: 130/60  Pulse: 72  Temp: 97.5 F (36.4 C)   Blood pressure recheck:  138/68, pulse 71  79 year old female in no acute distress.  HEENT:  Nares - clear.  OP- without lesions or erythema.  NECK:  Supple, nontender.  Right carotid bruit.    HEART:  Appears to be regular. LUNGS:  Without crackles or wheezing audible.  Respirations even and unlabored.   RADIAL PULSE:  Equal bilaterally.  ABDOMEN:  Soft, nontender.  No audible abdominal bruit.   EXTREMITIES:  No increased edema to be present.                    Assessment & Plan:  PRE OP.  Is planning for carotid surgery at the end of the week. Went for her pre op and had labs.  Obtain results to confirm hgb stable prior to surgery.  She reports she saw Dr Saralyn Pilar.  Review his notes for cardiac clearance.  Will need close intra op and post op monitoring of her heart rate and blood pressure to avoid extremes.  Will have her hold her metformin.    GI.  No further rectal bleeding.  Had a colonoscopy within the last couple of years.  Sees Dr Tiffany Kocher.  On Prilosec and doing well.    HEALTH MAINTENANCE.  Last physical 04/11/12.  Had a colonoscopy  within the last two years.  (Dr Tiffany Kocher).  Mammogram 05/09/12 - BiRADS II.   Had her flu shot.

## 2013-01-06 NOTE — Assessment & Plan Note (Signed)
Sugar under good control.  Will hold metformin for the procedure.  Stay hydrated.

## 2013-01-08 ENCOUNTER — Telehealth: Payer: Self-pay | Admitting: Internal Medicine

## 2013-01-08 NOTE — Telephone Encounter (Signed)
Put labs in folder on counter

## 2013-01-08 NOTE — Telephone Encounter (Signed)
Labs from [facility] in box

## 2013-01-09 ENCOUNTER — Inpatient Hospital Stay: Payer: Self-pay | Admitting: Vascular Surgery

## 2013-01-09 ENCOUNTER — Telehealth: Payer: Self-pay | Admitting: Internal Medicine

## 2013-01-09 HISTORY — PX: OTHER SURGICAL HISTORY: SHX169

## 2013-01-09 NOTE — Telephone Encounter (Signed)
Calling to let Dr. Nicki Reaper know that the pt had carotid artery surgery today and is doing well, breathing good.  Doctors do not think she will have any issues.  They will keep her at least one night in the CCU.

## 2013-01-09 NOTE — Telephone Encounter (Signed)
Thanks.  Keep [place] posted

## 2013-01-10 LAB — BASIC METABOLIC PANEL
Calcium, Total: 8.1 mg/dL — ABNORMAL LOW (ref 8.5–10.1)
Chloride: 109 mmol/L — ABNORMAL HIGH (ref 98–107)
Creatinine: 1.03 mg/dL (ref 0.60–1.30)
EGFR (African American): >60
EGFR (Non-African Amer.): 52 — ABNORMAL LOW
Osmolality: 287 (ref 275–301)
Sodium: 143 mmol/L (ref 136–145)

## 2013-01-10 LAB — CBC WITH DIFFERENTIAL/PLATELET
HCT: 34.7 % — ABNORMAL LOW (ref 35.0–47.0)
MCH: 31.7 pg (ref 26.0–34.0)
MCHC: 33 g/dL (ref 32.0–36.0)
Monocyte #: 0.5 x10 3/mm (ref 0.2–0.9)
Neutrophil #: 6.7 10*3/uL — ABNORMAL HIGH (ref 1.4–6.5)
Platelet: 150 10*3/uL (ref 150–440)
RDW: 15.7 % — ABNORMAL HIGH (ref 11.5–14.5)
WBC: 9.1 10*3/uL (ref 3.6–11.0)

## 2013-01-10 LAB — PROTIME-INR: INR: 1

## 2013-01-10 LAB — APTT: Activated PTT: 26.9 secs (ref 23.6–35.9)

## 2013-01-17 ENCOUNTER — Ambulatory Visit: Payer: Self-pay | Admitting: Internal Medicine

## 2013-01-30 LAB — CBC CANCER CENTER
Basophil %: 1.2 %
Eosinophil #: 0.3 x10 3/mm (ref 0.0–0.7)
Eosinophil %: 4.3 %
HCT: 35.9 % (ref 35.0–47.0)
HGB: 12.1 g/dL (ref 12.0–16.0)
Lymphocyte #: 2.4 x10 3/mm (ref 1.0–3.6)
Lymphocyte %: 31.1 %
MCH: 31.9 pg (ref 26.0–34.0)
Monocyte #: 0.5 x10 3/mm (ref 0.2–0.9)
Monocyte %: 6.3 %
Neutrophil %: 57.1 %
Platelet: 212 x10 3/mm (ref 150–440)

## 2013-01-30 LAB — IRON AND TIBC
Iron Saturation: 15 %
Iron: 58 ug/dL (ref 50–170)

## 2013-01-30 LAB — FERRITIN: Ferritin (ARMC): 35 ng/mL (ref 8–388)

## 2013-02-17 ENCOUNTER — Ambulatory Visit: Payer: Self-pay | Admitting: Internal Medicine

## 2013-03-20 ENCOUNTER — Other Ambulatory Visit: Payer: Self-pay | Admitting: *Deleted

## 2013-03-20 MED ORDER — METFORMIN HCL ER 500 MG PO TB24: 500.0000 mg | ORAL_TABLET | Freq: Every day | ORAL | Status: DC

## 2013-03-30 ENCOUNTER — Telehealth: Payer: Self-pay | Admitting: Internal Medicine

## 2013-04-06 ENCOUNTER — Ambulatory Visit (INDEPENDENT_AMBULATORY_CARE_PROVIDER_SITE_OTHER): Payer: 59 | Admitting: Internal Medicine

## 2013-04-06 ENCOUNTER — Ambulatory Visit: Payer: Self-pay | Admitting: Internal Medicine

## 2013-04-06 ENCOUNTER — Encounter: Payer: Self-pay | Admitting: Internal Medicine

## 2013-04-06 VITALS — BP 90/50 | HR 77 | Temp 98.6°F | Ht 60.0 in | Wt 146.5 lb

## 2013-04-06 DIAGNOSIS — R079 Chest pain, unspecified: Secondary | ICD-10-CM

## 2013-04-06 DIAGNOSIS — R209 Unspecified disturbances of skin sensation: Secondary | ICD-10-CM

## 2013-04-06 DIAGNOSIS — D649 Anemia, unspecified: Secondary | ICD-10-CM

## 2013-04-06 DIAGNOSIS — I10 Essential (primary) hypertension: Secondary | ICD-10-CM

## 2013-04-06 DIAGNOSIS — I2510 Atherosclerotic heart disease of native coronary artery without angina pectoris: Secondary | ICD-10-CM

## 2013-04-06 DIAGNOSIS — E7800 Pure hypercholesterolemia, unspecified: Secondary | ICD-10-CM

## 2013-04-06 DIAGNOSIS — I779 Disorder of arteries and arterioles, unspecified: Secondary | ICD-10-CM

## 2013-04-06 DIAGNOSIS — R200 Anesthesia of skin: Secondary | ICD-10-CM

## 2013-04-06 DIAGNOSIS — E119 Type 2 diabetes mellitus without complications: Secondary | ICD-10-CM

## 2013-04-06 LAB — CBC CANCER CENTER
Basophil #: 0.1 x10 3/mm (ref 0.0–0.1)
Basophil %: 1.3 %
Eosinophil #: 0.3 x10 3/mm (ref 0.0–0.7)
Eosinophil %: 4.5 %
HCT: 24.7 % — ABNORMAL LOW (ref 35.0–47.0)
Lymphocyte %: 27.5 %
MCH: 29.9 pg (ref 26.0–34.0)
Monocyte #: 0.5 x10 3/mm (ref 0.2–0.9)
RBC: 2.71 10*6/uL — ABNORMAL LOW (ref 3.80–5.20)
RDW: 16 % — ABNORMAL HIGH (ref 11.5–14.5)

## 2013-04-06 LAB — IRON AND TIBC
Iron Bind.Cap.(Total): 397 ug/dL (ref 250–450)
Unbound Iron-Bind.Cap.: 366 ug/dL

## 2013-04-06 LAB — FERRITIN: Ferritin (ARMC): 17 ng/mL (ref 8–388)

## 2013-04-06 NOTE — Patient Instructions (Signed)
You have an appt with Dr Ma Hillock at 11:15 tomorrow, 2:00 with Dr Ma Hillock.  Hold the hctz for now.  Monitor blood pressure.  We will add back as your blood pressure tolerates.

## 2013-04-07 ENCOUNTER — Encounter: Payer: Self-pay | Admitting: Internal Medicine

## 2013-04-07 ENCOUNTER — Telehealth: Payer: Self-pay | Admitting: Internal Medicine

## 2013-04-07 DIAGNOSIS — R200 Anesthesia of skin: Secondary | ICD-10-CM | POA: Insufficient documentation

## 2013-04-07 NOTE — Telephone Encounter (Signed)
I really need to recheck her before the weekend.  This would be a recheck/f/u appt from her visit yesterday.  I want to see her to determine how to adjust her meds.  Needs to come in Friday at 11:00 - (30 min).  I will not be here Thursday.  Thanks.

## 2013-04-07 NOTE — Telephone Encounter (Signed)
Please schedule pt a return appt on Friday 04/10/13 - 11:00 (block 66min).  Thanks.

## 2013-04-07 NOTE — Telephone Encounter (Signed)
Pt stated she could not come in Friday her grandson is getting married and she would be to busy on friday

## 2013-04-07 NOTE — Assessment & Plan Note (Signed)
Continue on Lipitor.  Low cholesterol diet and exercise.  Follow lipid panel and liver function.

## 2013-04-07 NOTE — Assessment & Plan Note (Signed)
Seeing Dr Delana Meyer.  S/p right carotid surgery.  Needs the left (per pt).  Follow.  Continue aspirin and plavix.

## 2013-04-07 NOTE — Assessment & Plan Note (Addendum)
Had the episode of acute leg numbness as outlined.  Lasted approximately five minutes.  Resolved.  No other neurological changes.  No reoccurrence.  Ortho evaluated and did not feel this was related to her back.  Possible TIA.  With the hgb being low, blood pressure lower than her normal and taking her blood pressure medications - consider the possibility of low flow state.   She also has the carotid artery disease.  Is s/p right carotid surgery.  Needs left.  I am going to continue her plavix and aspirin for now.  Plans to see GI and Dr Ma Hillock as outlined.  Will need to follow for increased bleeding on the aspirin and plavix.  Will hold on making any changes in her antiplatelet therapy.  She was informed that if she had any reoccurring symptoms or problems, she was to be evaluated immediately.  She declines admission.

## 2013-04-07 NOTE — Assessment & Plan Note (Signed)
Sugars have been under good control.

## 2013-04-07 NOTE — Progress Notes (Addendum)
Subjective:    Patient ID: Jocelyn Elliott, female    DOB: June 04, 1932, 79 y.o.   MRN: [id number]  HPI 79 year old female with past history of CAD, carotid disease s/p carotid endarterectomy, hypertension, hypercholesterolemia and diabetes.  She comes in today as a work in with concerns regarding some acute left leg numbness that occurred last week.  She reports that (last week) she was driving in her car.  Noticed her right leg went numb.  She got out of the car and walked around for a brief period.  The episode lasted approximately five minutes.  She got back in her car and drove home.  No other neurological changes.  Had some nausea.  No chest pain then.  She is also seeing Dr Ronalee Belts for her carotid disease.  Had carotid surgery 01/09/13 - on the right.  Needs surgery on the left (per pt).  Has had no other numbness and no weakness.  She reports that over the last few weeks, she has noticed being more fatigued.  Notices chest pain if she hurries or with increased exertion.  States she is eating and drinking well.  Increased fatigue.  Had her blood drawn at [facility] this am.  Called Dr Ma Hillock - Hgb now 8.1.  (was 12 - 3/14).  She also saw Reche Dixon this am and he is the one that referred her over here for evaluation of the leg numbness - to confirm not TIA.   She is taking Plavix and 81mg  aspirin.    Past Medical History  Diagnosis Date  . Arthritis   . Depression   . Diabetes mellitus   . Hypertension   . Hyperlipidemia   . Blood in stool   . CAD (coronary artery disease)   . Carotid arterial disease     Current Outpatient Prescriptions on File Prior to Visit  Medication Sig Dispense Refill  . aspirin 81 MG tablet Take 81 mg by mouth daily.      Marland Kitchen atorvastatin (LIPITOR) 40 MG tablet Take 1 tablet (40 mg total) by mouth daily. 1 tablet and then alternate next day with 20mg  (1/2 tablet)  90 tablet  3  . calcium carbonate (OS-CAL) 600 MG TABS Take 600 mg by mouth daily.      .  cetirizine (ZYRTEC) 10 MG tablet Take 10 mg by mouth daily.      . hydrochlorothiazide (MICROZIDE) 12.5 MG capsule Take 1 capsule (12.5 mg total) by mouth daily.  90 capsule  3  . metFORMIN (GLUCOPHAGE-XR) 500 MG 24 hr tablet Take 1 tablet (500 mg total) by mouth daily.  90 tablet  1  . Omega-3 Fatty Acids (FISH OIL) 1200 MG CAPS Take 1 capsule by mouth daily.      Marland Kitchen omeprazole (PRILOSEC) 20 MG capsule Take 20 mg by mouth daily.      . ramipril (ALTACE) 10 MG capsule Take 1 capsule (10 mg total) by mouth daily.  90 capsule  3  . TOPROL XL 100 MG 24 hr tablet Take 100 mg by mouth 2 (two) times daily.       . vitamin C (ASCORBIC ACID) 500 MG tablet Take 500 mg by mouth daily.      Marland Kitchen aspirin 325 MG tablet Take 325 mg by mouth daily.       No current facility-administered medications on file prior to visit.    Review of Systems Patient denies any headache, lightheadedness or dizziness.  No significant allergy or sinus  symptoms.  Does report chest pain with increased exertion.  Started 2-3 weeks ago and has been persistent.  Is relieved if she stops and rest.   No increased shortness of breath, cough or congestion.  No acid reflux, dysphagia or odynophagia.  Some previus nausea.  No vomiting.    No abdominal pain or cramping.  She did notice some black stool a couple of weeks ago.  States her stool now is back to its normal color.  No urine change.  Increased fatigue.  Leg numbness as outlined.  Lasted approximately five minutes.  Resolved.  No other neurological changes.  Has not reoccurred.        Objective:   Physical Exam  Filed Vitals:   04/06/13 1448  BP: 90/50  Pulse: 77  Temp: 98.6 F (37 C)   Blood pressure recheck:  132/64 standing, lying 41-53/81  79 year old female in no acute distress.   HEENT:  Nares - clear.  OP- without lesions or erythema.  NECK:  Supple, nontender.  Right carotid bruit.    HEART:  Appears to be regular. LUNGS:  Without crackles or wheezing audible.   Respirations even and unlabored.   RADIAL PULSE:  Equal bilaterally.  ABDOMEN:  Soft, nontender.  No audible abdominal bruit.   EXTREMITIES:  No increased edema to be present.  RECTAL:  Heme positive.  Stool brown.  No gross blood.  NEURO: no focal neuro deficits noted.                    Assessment & Plan:   GI.  Melena as outlined.  Now with heme positive stool.   Sees Dr Tiffany Kocher.  On Prilosec.  Upper symptoms controlled.  Hematology referring her back for evaluation.    HEALTH MAINTENANCE.  Last physical 04/11/12.  Had a colonoscopy within the last two years.  (Dr Tiffany Kocher).  Mammogram 05/09/12 - BiRADS II.     All of the above discussed in detail with the patient.  I discussed admission, especially given multiple issues - ie, previous melena, heme positive stool, decreasing hgb, chest pain and acute left leg numbness.  She declines at this time.  Instructed her that if she has any reoccurring symptoms or problems - she needs to be evaluated.    I spent over 45 minutes with the patient and more than 50% of the time was spent in consultation regarding the above.

## 2013-04-07 NOTE — Assessment & Plan Note (Signed)
Has known disease.  Now experiencing chest pain with increased activity.  New symptoms as of 2-3 weeks ago.  Pain resolves with rest.  EKG obtained and revealed SR with no acute ischemic changes.  Refer back to Dr Saralyn Pilar for evaluation.  Appointment scheduled for tomorrow.  Also, plans to see Dr Ma Hillock to start back on her iron treatments.  This should also improve perfusion to her heart.

## 2013-04-07 NOTE — Telephone Encounter (Signed)
Left message for pt to call office

## 2013-04-07 NOTE — Assessment & Plan Note (Signed)
Blood pressure a little lower than she normally runs.  Will hold the HCTZ for now.  Have her spot check her pressure.  Call in readings.  Will add back the HCTZ as her blood pressure allows.

## 2013-04-07 NOTE — Telephone Encounter (Signed)
Pt aware of appointment 5/23 @ 11

## 2013-04-07 NOTE — Assessment & Plan Note (Signed)
Sees Dr Ma Hillock.  I called his office.  Per their report, Hgb 12.1 in 3/14.  Now hgb 8.1.  Had the previous melena.  With heme positive stool now.  Dr Ma Hillock to see her tomorrow - to start iron treatments.  They are also planning to contact GI for referral back for evaluation.

## 2013-04-08 ENCOUNTER — Telehealth: Payer: Self-pay | Admitting: Internal Medicine

## 2013-04-08 NOTE — Telephone Encounter (Signed)
Since she had just walked across the room and then took her bp, have her recheck her blood pressure a little later and confirm not continuing to increase.  Have her sit and relax a few minutes before checking.  Let us know if a problem.  I agree with her calling in more readings on Friday - since we adjusted some of her medication.

## 2013-04-08 NOTE — Telephone Encounter (Signed)
Pt called stated her bp today left 146/64 pulse 85   Right arm  160/78  Pulse 76 Pt stated she just walked across the house and then took bp Yesterday at cancer center 112/56 Pt stated she is feeling some better  Pt stated she would not be able to come in friday. She has a wedding out of town and shes riding with someone else and can not be here Pt will call about her bp on Friday

## 2013-04-10 ENCOUNTER — Telehealth: Payer: Self-pay | Admitting: Internal Medicine

## 2013-04-10 ENCOUNTER — Ambulatory Visit: Payer: 59 | Admitting: Internal Medicine

## 2013-04-10 NOTE — Telephone Encounter (Signed)
Left detailed message & asked pt to return my call.

## 2013-04-10 NOTE — Telephone Encounter (Signed)
She will need to remain off the hctz.  Continue to monitor blood pressure.  Monitor for any bleeding.  Given the concern regarding the possible recent TIA and her recent carotid surgery - would not hold plavix unless instructed by Dr Tiffany Kocher or Dr Ma Hillock.  Make sure she kept cardiology appt the other day.  Did they discuss doing any further testing for her chest pain.  I will not be in the office tomorrow (Saturday or this weekend).  We will return to the office on Tuesday.  If feeling bad/worse - she can go to acute care/er over the weekend.

## 2013-04-10 NOTE — Telephone Encounter (Signed)
Pt called to give you her bp readings Pt is leaving today @ 12 to go to a wedding Pt stated that you spoke to her earlier in the week about going to the hospital she stated if you still want her to go she will go tomorrow to [facility].  Pt stated she is still feeling bad bp 108/48  Pulse 85 left arm    110/55 right arm  Pulse 80 Pt had carotid artery surgery in feb 2014 she is on plavix and Asprin  Pt stop her plavix.  Pt went to dr Tiffany Kocher wed.  He told her not to take plavix and aspirin at same time.  Pt stopped taken plavix wed on her own. Pt stated her hemoglobin dropped from 12.1 to 8.1 in 2 months time after she started taking plavix. Pt had iron infusion Tuesday.  Pt stated when she walks across room is she very tired Pt stated she could not miss wedding today. Please advise pt what you want her to do Best number to get a hold pt  [phone] until 11.  Cell phone [phone]

## 2013-04-14 ENCOUNTER — Telehealth: Payer: Self-pay | Admitting: Internal Medicine

## 2013-04-14 LAB — CBC CANCER CENTER
Eosinophil #: 0.2 x10 3/mm (ref 0.0–0.7)
HCT: 26.6 % — ABNORMAL LOW (ref 35.0–47.0)
HGB: 8.6 g/dL — ABNORMAL LOW (ref 12.0–16.0)
MCHC: 32.3 g/dL (ref 32.0–36.0)
Monocyte %: 7.2 %
Neutrophil %: 62.5 %
Platelet: 216 x10 3/mm (ref 150–440)
RBC: 2.81 10*6/uL — ABNORMAL LOW (ref 3.80–5.20)
RDW: 19.8 % — ABNORMAL HIGH (ref 11.5–14.5)
WBC: 6.5 x10 3/mm (ref 3.6–11.0)

## 2013-04-14 NOTE — Telephone Encounter (Signed)
I agree with evaluation today.  Sounds like she is going to the cancer center.  Keep [place] posted.  See other message

## 2013-04-14 NOTE — Telephone Encounter (Signed)
Pt called back stated she has appointment today with the cancer center

## 2013-04-14 NOTE — Telephone Encounter (Signed)
Patient Information:  Caller Name: [facility]  Phone: [phone]  Patient: Jocelyn Elliott, Jocelyn Elliott  Gender: Female  DOB: Feb 15, 1932  Age: 79 Years  PCP: Einar Pheasant  Office Follow Up:  Does the office need to follow up with this patient?: Yes  Instructions For The Office: Disposition ER/office now - no appts available, pt refuses ER, please call pt ASAP with advice.  Pt has also called cancer center with sxs and awaiting callback.   Symptoms  Reason For Call & Symptoms: On 04/06/13 cancer center stated hgb dropped from 12.1 to 8.1.   Received Iron treatment at cancer center on 04/07/13.   04/14/13 feels weak, must walk slower, bad headache, chest pain comes and goes with walking only. light headed at times.  Seen at cardiologist last week and ekg and workup was normal,  BS this am 93, 156/74 pulse 78.  Pt states she knows she just needs blood.  Advised pt if no call back from Dr Nicki Reaper within 30 minutes to go to ER, pt states she will not go to ER due to can't afford more medical bills.   Pt will call 911 with any constant chest pain, unable to stand to do weakness or lightheadedness.  Reviewed Health History In EMR: Yes  Reviewed Medications In EMR: Yes  Reviewed Allergies In EMR: Yes  Reviewed Surgeries / Procedures: Yes  Date of Onset of Symptoms: 04/14/2013  Guideline(s) Used:  Weakness (Generalized) and Fatigue  Chest Pain  Disposition Per Guideline:   Go to ED Now (or to Office with PCP Approval)  Reason For Disposition Reached:   Dizziness or lightheadedness  Advice Given:  N/A  Patient Refused Recommendation:  Patient Refused Care Advice  Pt refuses ER, Requests call back from Dr Nicki Reaper

## 2013-04-14 NOTE — Telephone Encounter (Signed)
Spoke with pt on 5/21 & informed her of Dr. Bary Leriche recommendations

## 2013-04-14 NOTE — Telephone Encounter (Signed)
Needs evaluation today.  See other message.  Pt apparently going to cancer center today.  Needs to keep [place] posted.

## 2013-04-19 ENCOUNTER — Ambulatory Visit: Payer: Self-pay | Admitting: Internal Medicine

## 2013-04-22 LAB — HEMOGLOBIN: HGB: 9.8 g/dL — ABNORMAL LOW (ref 12.0–16.0)

## 2013-05-07 ENCOUNTER — Encounter: Payer: 59 | Admitting: Internal Medicine

## 2013-05-19 ENCOUNTER — Ambulatory Visit: Payer: Self-pay | Admitting: Internal Medicine

## 2013-05-29 ENCOUNTER — Encounter: Payer: Self-pay | Admitting: Internal Medicine

## 2013-05-29 ENCOUNTER — Ambulatory Visit (INDEPENDENT_AMBULATORY_CARE_PROVIDER_SITE_OTHER): Payer: 59 | Admitting: Internal Medicine

## 2013-05-29 VITALS — BP 150/70 | HR 75 | Temp 98.0°F | Ht 58.5 in | Wt 147.5 lb

## 2013-05-29 DIAGNOSIS — R200 Anesthesia of skin: Secondary | ICD-10-CM

## 2013-05-29 DIAGNOSIS — I10 Essential (primary) hypertension: Secondary | ICD-10-CM

## 2013-05-29 DIAGNOSIS — E7800 Pure hypercholesterolemia, unspecified: Secondary | ICD-10-CM

## 2013-05-29 DIAGNOSIS — I2510 Atherosclerotic heart disease of native coronary artery without angina pectoris: Secondary | ICD-10-CM

## 2013-05-29 DIAGNOSIS — D649 Anemia, unspecified: Secondary | ICD-10-CM

## 2013-05-29 DIAGNOSIS — I779 Disorder of arteries and arterioles, unspecified: Secondary | ICD-10-CM

## 2013-05-29 DIAGNOSIS — E119 Type 2 diabetes mellitus without complications: Secondary | ICD-10-CM

## 2013-05-29 DIAGNOSIS — R209 Unspecified disturbances of skin sensation: Secondary | ICD-10-CM

## 2013-05-31 ENCOUNTER — Encounter: Payer: Self-pay | Admitting: Internal Medicine

## 2013-05-31 NOTE — Assessment & Plan Note (Signed)
Sugars have been under good control.  Follow metabolic panel and [id number].

## 2013-05-31 NOTE — Assessment & Plan Note (Signed)
Sees Dr Ma Hillock.  Receiving iron treatments.  Last documented hgb 11.6.  Improved.  Pt declines further GI w/up currently.  Follow.

## 2013-05-31 NOTE — Assessment & Plan Note (Signed)
Seeing Dr Delana Meyer.  S/p right carotid surgery.  Needs the left (per pt).  Follow.  Continue aspirin.  Plavix was stopped.

## 2013-05-31 NOTE — Assessment & Plan Note (Signed)
No reoccurrence.  Continues on aspirin daily. Plavix was stopped secondary to bleeding.  Desires not further testing currently.

## 2013-05-31 NOTE — Assessment & Plan Note (Signed)
Has known disease.  Sees Dr Saralyn Pilar.  Wanted to do chemical stress test.  Pt declines.  Currently doing well and desires no further testing.  Follow.

## 2013-05-31 NOTE — Assessment & Plan Note (Signed)
Blood pressure has been running high.  See her attached list.  Will add back hctz 12.5mg  q day.  Follow pressures.  Stay hydrated.  Get her back in soon to reassess.

## 2013-05-31 NOTE — Assessment & Plan Note (Signed)
Continue on Lipitor.  Low cholesterol diet and exercise.  Follow lipid panel and liver function.

## 2013-05-31 NOTE — Progress Notes (Signed)
Subjective:    Patient ID: Jocelyn Elliott, female    DOB: 29-Jan-1932, 79 y.o.   MRN: [id number]  HPI 79 year old female with past history of CAD, carotid disease s/p carotid endarterectomy, hypertension, hypercholesterolemia and diabetes.  She comes in today to follow up on these issues as well as for a complete physical exam.  Last visit she was having some chest discomfort and sob with exertion.  See last note for details.  She was found to have a hgb of 8.1.  Has been receiving IV iron treatments at Dr Candelaria Stagers.  Hgb back up to 11.6 now.  Feels better.  Feels back to her baseline.  Saw Dr Saralyn Pilar.  Wanted to do a chemical stress test.  Now that she is feeling better, she declines to have a chemical stress test.  GI wanted to do an EGD, but wanted her to have a stress test first.  She is going to hold on any further testing at this time.  Feels good.  Blood pressure has been elevated since stopping the HCTZ.  See attached list for details.     Past Medical History  Diagnosis Date  . Arthritis   . Depression   . Diabetes mellitus   . Hypertension   . Hyperlipidemia   . Blood in stool   . CAD (coronary artery disease)   . Carotid arterial disease     Current Outpatient Prescriptions on File Prior to Visit  Medication Sig Dispense Refill  . aspirin 81 MG tablet Take 81 mg by mouth daily.      Marland Kitchen atorvastatin (LIPITOR) 40 MG tablet Take 1 tablet (40 mg total) by mouth daily. 1 tablet and then alternate next day with 20mg  (1/2 tablet)  90 tablet  3  . calcium carbonate (OS-CAL) 600 MG TABS Take 600 mg by mouth daily.      . cetirizine (ZYRTEC) 10 MG tablet Take 10 mg by mouth daily.      . metFORMIN (GLUCOPHAGE-XR) 500 MG 24 hr tablet Take 1 tablet (500 mg total) by mouth daily.  90 tablet  1  . Omega-3 Fatty Acids (FISH OIL) 1200 MG CAPS Take 1 capsule by mouth daily.      Marland Kitchen omeprazole (PRILOSEC) 20 MG capsule Take 20 mg by mouth daily.      . ramipril (ALTACE) 10 MG capsule Take 1 capsule  (10 mg total) by mouth daily.  90 capsule  3  . TOPROL XL 100 MG 24 hr tablet Take 100 mg by mouth 2 (two) times daily.       . vitamin C (ASCORBIC ACID) 500 MG tablet Take 500 mg by mouth daily.       No current facility-administered medications on file prior to visit.    Review of Systems Patient denies any headache, lightheadedness or dizziness.  No sinus or allergy symptoms.  No chest pain, tightness or palpitations.  No increased shortness of breath, cough or congestion.  No nausea or vomiting.  No acid reflux.  No abdominal pain or cramping.  No bowel change, such as diarrhea, constipation, BRBPR or melana.  No urine change.   Feels back to her baseline.  Energy is better.         Objective:   Physical Exam  Filed Vitals:   05/29/13 1457  BP: 150/70  Pulse: 75  Temp: 98 F (36.7 C)   Blood pressure recheck:  132/68, pulse 66  79 year old female in no acute distress.  HEENT:  Nares - clear.  OP- without lesions or erythema.  NECK:  Supple, nontender.  Right carotid bruit.    HEART:  Appears to be regular. LUNGS:  Without crackles or wheezing audible.  Respirations even and unlabored.   RADIAL PULSE:  Equal bilaterally.  BREASTS:  No nipple discharge or nipple retraction present.  Could not appreciate any distinct nodules or axillary adenopathy to be present.    ABDOMEN:  Soft, nontender.  No audible abdominal bruit.   EXTREMITIES:  No increased edema to be present.  GU:  She deferred.   RECTAL:  She deferred.    NEURO: no focal neuro deficits noted.                    Assessment & Plan:   GI.   Sees Dr Tiffany Kocher.  On Prilosec.  Upper symptoms controlled.  GI wanted to do an EGD.  Wanted stress test first.  She declines stress test.  Wants to monitor for now.    HEALTH MAINTENANCE.  Physical today.  Had a colonoscopy within the last two years.  (Dr Tiffany Kocher).  Mammogram 05/09/12 - BiRADS II.  Schedule a follow up mammogram.

## 2013-06-03 ENCOUNTER — Telehealth: Payer: Self-pay | Admitting: Internal Medicine

## 2013-06-03 MED ORDER — METOPROLOL SUCCINATE ER 100 MG PO TB24: 100.0000 mg | ORAL_TABLET | Freq: Two times a day (BID) | ORAL | Status: DC

## 2013-06-03 NOTE — Telephone Encounter (Signed)
Pt called stated her pharmacy faxed order for refill on her toprol on 7/8.  Pt called her pharmcy and they have a ? Before they can refill her med   [phone]   [facility]  Pt is out of her med she would like 30 supply sent to [facility]   Pt has been out of her meds for 3 days Please advise

## 2013-06-03 NOTE — Telephone Encounter (Signed)
Contacted pharmacy-they stated that they received a fax back stating that provider is no longer at that office. I sent in a Rx electronically so that they would have all of Dr. Bary Leriche updated information. Pt notified also

## 2013-06-05 ENCOUNTER — Other Ambulatory Visit (INDEPENDENT_AMBULATORY_CARE_PROVIDER_SITE_OTHER): Payer: 59

## 2013-06-05 DIAGNOSIS — E119 Type 2 diabetes mellitus without complications: Secondary | ICD-10-CM

## 2013-06-05 DIAGNOSIS — E7800 Pure hypercholesterolemia, unspecified: Secondary | ICD-10-CM

## 2013-06-05 LAB — LIPID PANEL
Cholesterol: 144 mg/dL (ref 0–200)
HDL: 41 mg/dL
LDL Cholesterol: 72 mg/dL (ref 0–99)
Total CHOL/HDL Ratio: 4
Triglycerides: 156 mg/dL — ABNORMAL HIGH (ref 0.0–149.0)
VLDL: 31.2 mg/dL (ref 0.0–40.0)

## 2013-06-05 LAB — HEPATIC FUNCTION PANEL
Bilirubin, Direct: 0 mg/dL (ref 0.0–0.3)
Total Bilirubin: 0.5 mg/dL (ref 0.3–1.2)
Total Protein: 6.5 g/dL (ref 6.0–8.3)

## 2013-06-05 LAB — BASIC METABOLIC PANEL WITH GFR
BUN: 21 mg/dL (ref 6–23)
CO2: 27 meq/L (ref 19–32)
Calcium: 9.3 mg/dL (ref 8.4–10.5)
Chloride: 100 meq/L (ref 96–112)
Creatinine, Ser: 1.1 mg/dL (ref 0.4–1.2)
GFR: 53.15 mL/min — ABNORMAL LOW
Glucose, Bld: 132 mg/dL — ABNORMAL HIGH (ref 70–99)
Potassium: 4.2 meq/L (ref 3.5–5.1)
Sodium: 137 meq/L (ref 135–145)

## 2013-06-05 LAB — MICROALBUMIN / CREATININE URINE RATIO
Microalb Creat Ratio: 0.7 mg/g (ref 0.0–30.0)
Microalb, Ur: 0.5 mg/dL (ref 0.0–1.9)

## 2013-06-08 ENCOUNTER — Encounter: Payer: Self-pay | Admitting: *Deleted

## 2013-06-17 ENCOUNTER — Ambulatory Visit: Payer: Self-pay | Admitting: Internal Medicine

## 2013-06-22 ENCOUNTER — Ambulatory Visit: Payer: Self-pay | Admitting: Internal Medicine

## 2013-06-29 ENCOUNTER — Encounter: Payer: Self-pay | Admitting: Internal Medicine

## 2013-07-09 ENCOUNTER — Encounter: Payer: Self-pay | Admitting: Internal Medicine

## 2013-07-10 ENCOUNTER — Ambulatory Visit: Payer: 59 | Admitting: Internal Medicine

## 2013-07-15 ENCOUNTER — Encounter: Payer: Self-pay | Admitting: Internal Medicine

## 2013-07-15 ENCOUNTER — Ambulatory Visit (INDEPENDENT_AMBULATORY_CARE_PROVIDER_SITE_OTHER): Payer: 59 | Admitting: Internal Medicine

## 2013-07-15 VITALS — BP 130/70 | HR 78 | Temp 98.1°F | Ht 58.5 in | Wt 147.2 lb

## 2013-07-15 DIAGNOSIS — D649 Anemia, unspecified: Secondary | ICD-10-CM

## 2013-07-15 DIAGNOSIS — E7800 Pure hypercholesterolemia, unspecified: Secondary | ICD-10-CM

## 2013-07-15 DIAGNOSIS — I2510 Atherosclerotic heart disease of native coronary artery without angina pectoris: Secondary | ICD-10-CM

## 2013-07-15 DIAGNOSIS — I779 Disorder of arteries and arterioles, unspecified: Secondary | ICD-10-CM

## 2013-07-15 DIAGNOSIS — R200 Anesthesia of skin: Secondary | ICD-10-CM

## 2013-07-15 DIAGNOSIS — M25559 Pain in unspecified hip: Secondary | ICD-10-CM

## 2013-07-15 DIAGNOSIS — R209 Unspecified disturbances of skin sensation: Secondary | ICD-10-CM

## 2013-07-15 DIAGNOSIS — M25552 Pain in left hip: Secondary | ICD-10-CM

## 2013-07-15 DIAGNOSIS — M779 Enthesopathy, unspecified: Secondary | ICD-10-CM

## 2013-07-15 DIAGNOSIS — E119 Type 2 diabetes mellitus without complications: Secondary | ICD-10-CM

## 2013-07-15 DIAGNOSIS — I10 Essential (primary) hypertension: Secondary | ICD-10-CM

## 2013-07-19 ENCOUNTER — Encounter: Payer: Self-pay | Admitting: Internal Medicine

## 2013-07-19 DIAGNOSIS — M779 Enthesopathy, unspecified: Secondary | ICD-10-CM | POA: Insufficient documentation

## 2013-07-19 NOTE — Assessment & Plan Note (Signed)
Sugars have been under good control.  Follow metabolic panel and A999333.  Recent a1c 6.1.

## 2013-07-19 NOTE — Assessment & Plan Note (Signed)
Seeing Dr Delana Meyer.  S/p right carotid surgery.  Needs the left (per pt).  Follow.  Continue aspirin.  Plavix was stopped.

## 2013-07-19 NOTE — Assessment & Plan Note (Addendum)
No reoccurrence.  Continues on aspirin daily. Plavix was stopped secondary to bleeding.  Desires no further testing.

## 2013-07-19 NOTE — Assessment & Plan Note (Signed)
Sees Dr Ma Hillock.  Receiving iron treatments.  Hgb improved.  Pt declines further GI w/up currently.  Follow.

## 2013-07-19 NOTE — Assessment & Plan Note (Signed)
Has known disease.  Sees Dr Saralyn Pilar.  Wanted to do chemical stress test.  Pt declines.  Currently doing well and desires no further testing.  Follow.

## 2013-07-19 NOTE — Assessment & Plan Note (Signed)
Due to follow up with Dr Elvina Mattes today.  Follow.

## 2013-07-19 NOTE — Assessment & Plan Note (Signed)
Continue on Lipitor.  Low cholesterol diet and exercise.  Follow lipid panel and liver function.

## 2013-07-19 NOTE — Progress Notes (Signed)
Subjective:    Patient ID: Jocelyn Elliott, female    DOB: 1932-04-23, 79 y.o.   MRN: [id number]  HPI 79 year old female with past history of CAD, carotid disease s/p carotid endarterectomy, hypertension, hypercholesterolemia and diabetes.  She comes in today for a scheduled follow up. Has been receiving IV iron treatments at Dr Candelaria Stagers.  Hgb back up now.  Feels better.  Feels back to her baseline.  Saw Dr Saralyn Pilar.  Wanted to do a chemical stress test.  Now that she is feeling better, she declines to have a chemical stress test.  GI wanted to do an EGD, but wanted her to have a stress test first.  She is going to hold on any further testing at this time.  Feels good. Blood pressure was elevated.  We started her back on her HCTZ last visit.  Tolerating.  Blood pressures better.  Had tendonitis.  Saw Dr Elvina Mattes.  Was placed on prednisone taper.  Did not tolerate.  Blood pressure went up on prednisone.  As soon as she stopped it, blood pressure normalized.  Has follow up with Dr Elvina Mattes today.  Blood sugars averaging 120s(am).  Overall feels good.     Past Medical History  Diagnosis Date  . Arthritis   . Depression   . Diabetes mellitus   . Hypertension   . Hyperlipidemia   . Blood in stool   . CAD (coronary artery disease)   . Carotid arterial disease     Current Outpatient Prescriptions on File Prior to Visit  Medication Sig Dispense Refill  . aspirin 81 MG tablet Take 81 mg by mouth daily.      Marland Kitchen atorvastatin (LIPITOR) 40 MG tablet Take 1 tablet (40 mg total) by mouth daily. 1 tablet and then alternate next day with 20mg  (1/2 tablet)  90 tablet  3  . calcium carbonate (OS-CAL) 600 MG TABS Take 600 mg by mouth daily.      . cetirizine (ZYRTEC) 10 MG tablet Take 10 mg by mouth daily.      . metFORMIN (GLUCOPHAGE-XR) 500 MG 24 hr tablet Take 1 tablet (500 mg total) by mouth daily.  90 tablet  1  . metoprolol succinate (TOPROL XL) 100 MG 24 hr tablet Take 1 tablet (100 mg total) by mouth 2  (two) times daily.  180 tablet  1  . Omega-3 Fatty Acids (FISH OIL) 1200 MG CAPS Take 1 capsule by mouth daily.      Marland Kitchen omeprazole (PRILOSEC) 20 MG capsule Take 20 mg by mouth daily.      . ramipril (ALTACE) 10 MG capsule Take 1 capsule (10 mg total) by mouth daily.  90 capsule  3  . vitamin C (ASCORBIC ACID) 500 MG tablet Take 500 mg by mouth daily.       No current facility-administered medications on file prior to visit.    Review of Systems Patient denies any headache, lightheadedness or dizziness.  No sinus or allergy symptoms.  No chest pain, tightness or palpitations.  No increased shortness of breath, cough or congestion.  No nausea or vomiting.  No acid reflux.  No abdominal pain or cramping.  No bowel change, such as diarrhea, constipation, BRBPR or melana.  No urine change.   Feels back to her baseline.  Energy is better.   Sugars as outlined.  Blood pressures doing better.  Does report some persistent hip pain.  Request to see Dr Marry Guan.        Objective:  Physical Exam  Filed Vitals:   07/15/13 1011  BP: 130/70  Pulse: 78  Temp: 98.1 F (36.7 C)   Blood pressure recheck:  42/3  79 year old female in no acute distress.   HEENT:  Nares - clear.  OP- without lesions or erythema.  NECK:  Supple, nontender.  Right carotid bruit.    HEART:  Appears to be regular. LUNGS:  Without crackles or wheezing audible.  Respirations even and unlabored.   RADIAL PULSE:  Equal bilaterally.  ABDOMEN:  Soft, nontender.  No audible abdominal bruit.   EXTREMITIES:  No increased edema to be present.     NEURO: no focal neuro deficits noted.              FEET:  No lesions.         Assessment & Plan:  HIP PAIN.  Refer to Dr Marry Guan for evaluation.    GI.   Sees Dr Tiffany Kocher.  On Prilosec.  Upper symptoms controlled.  GI wanted to do an EGD.  Wanted stress test first.  She declines stress test.  Wants to monitor for now.    HEALTH MAINTENANCE.  Physical last visit.  Had a colonoscopy  within the last two years.  (Dr Tiffany Kocher).  Mammogram 05/09/12 - BiRADS II.  Mammogram 06/17/13 recommended f/u views.  Follow up views 06/22/13 - Birads II.

## 2013-07-19 NOTE — Assessment & Plan Note (Signed)
Back on hctz 12.5mg  q day.  Follow pressures.  Doing better.  Follow metabolic panel.

## 2013-07-25 ENCOUNTER — Other Ambulatory Visit: Payer: Self-pay | Admitting: Internal Medicine

## 2013-08-14 ENCOUNTER — Ambulatory Visit: Payer: Self-pay | Admitting: Internal Medicine

## 2013-08-14 LAB — IRON AND TIBC: Unbound Iron-Bind.Cap.: 445 ug/dL

## 2013-08-14 LAB — CBC CANCER CENTER
Eosinophil #: 0.3 x10 3/mm (ref 0.0–0.7)
Eosinophil %: 4.1 %
HCT: 28.4 % — ABNORMAL LOW (ref 35.0–47.0)
Lymphocyte #: 2.2 x10 3/mm (ref 1.0–3.6)
Lymphocyte %: 29.7 %
MCV: 84 fL (ref 80–100)
Monocyte %: 6.8 %
Neutrophil %: 58.8 %
Platelet: 271 x10 3/mm (ref 150–440)
RBC: 3.4 10*6/uL — ABNORMAL LOW (ref 3.80–5.20)
WBC: 7.6 x10 3/mm (ref 3.6–11.0)

## 2013-08-19 ENCOUNTER — Ambulatory Visit: Payer: Self-pay | Admitting: Internal Medicine

## 2013-09-16 ENCOUNTER — Other Ambulatory Visit: Payer: Self-pay | Admitting: Internal Medicine

## 2013-09-19 ENCOUNTER — Ambulatory Visit: Payer: Self-pay | Admitting: Internal Medicine

## 2013-10-06 LAB — HM DIABETES FOOT EXAM

## 2013-10-07 ENCOUNTER — Ambulatory Visit (INDEPENDENT_AMBULATORY_CARE_PROVIDER_SITE_OTHER): Payer: Medicare Other | Admitting: Internal Medicine

## 2013-10-07 ENCOUNTER — Encounter: Payer: Self-pay | Admitting: Internal Medicine

## 2013-10-07 VITALS — BP 120/70 | HR 75 | Temp 97.8°F | Ht 58.5 in | Wt 146.5 lb

## 2013-10-07 DIAGNOSIS — E7800 Pure hypercholesterolemia, unspecified: Secondary | ICD-10-CM

## 2013-10-07 DIAGNOSIS — M25552 Pain in left hip: Secondary | ICD-10-CM

## 2013-10-07 DIAGNOSIS — I779 Disorder of arteries and arterioles, unspecified: Secondary | ICD-10-CM

## 2013-10-07 DIAGNOSIS — E119 Type 2 diabetes mellitus without complications: Secondary | ICD-10-CM

## 2013-10-07 DIAGNOSIS — M25559 Pain in unspecified hip: Secondary | ICD-10-CM

## 2013-10-07 DIAGNOSIS — M779 Enthesopathy, unspecified: Secondary | ICD-10-CM

## 2013-10-07 DIAGNOSIS — I10 Essential (primary) hypertension: Secondary | ICD-10-CM

## 2013-10-07 DIAGNOSIS — Z23 Encounter for immunization: Secondary | ICD-10-CM

## 2013-10-07 DIAGNOSIS — I2510 Atherosclerotic heart disease of native coronary artery without angina pectoris: Secondary | ICD-10-CM

## 2013-10-07 DIAGNOSIS — D649 Anemia, unspecified: Secondary | ICD-10-CM

## 2013-10-07 NOTE — Progress Notes (Signed)
Subjective:    Patient ID: Jocelyn Elliott, female    DOB: Mar 30, 1932, 79 y.o.   MRN: [id number]  HPI 79 year old female with past history of CAD, carotid disease s/p carotid endarterectomy, hypertension, hypercholesterolemia and diabetes.  She comes in today for a scheduled follow up. Has been receiving IV iron treatments at Dr Candelaria Stagers.  Hgb back up now.  Feels better.  Feels back to her baseline.  Saw Dr Saralyn Pilar.  Wanted to do a chemical stress test.  Since feeling better, she declined to have a chemical stress test.  GI wanted to do an EGD, but wanted her to have a stress test first.  She wanted to hold on any further testing.  Feels good. Blood pressure was elevated.  We started her back on her HCTZ. She is tolerating.  Blood pressures better.  Had tendonitis.  Saw Dr Elvina Mattes.  Had a cast for two weeks.  Now doing well with tennis shoes.  She saw Dr Marry Guan for left hip pain.  He recommended therapy.  She is doing exercises at home. Xray ok.  States her blood pressure has been doing well.  Blood pressure averaging 116-130/60s.      Past Medical History  Diagnosis Date  . Arthritis   . Depression   . Diabetes mellitus   . Hypertension   . Hyperlipidemia   . Blood in stool   . CAD (coronary artery disease)   . Carotid arterial disease     Current Outpatient Prescriptions on File Prior to Visit  Medication Sig Dispense Refill  . aspirin 81 MG tablet Take 81 mg by mouth daily.      Marland Kitchen atorvastatin (LIPITOR) 40 MG tablet Take 1 tablet (40 mg total) by mouth daily. 1 tablet and then alternate next day with 20mg  (1/2 tablet)  90 tablet  3  . calcium carbonate (OS-CAL) 600 MG TABS Take 600 mg by mouth daily.      . cetirizine (ZYRTEC) 10 MG tablet Take 10 mg by mouth daily.      . metFORMIN (GLUCOPHAGE-XR) 500 MG 24 hr tablet TAKE 1 TABLET DAILY  90 tablet  1  . metoprolol succinate (TOPROL XL) 100 MG 24 hr tablet Take 1 tablet (100 mg total) by mouth 2 (two) times daily.  180 tablet  1  .  Omega-3 Fatty Acids (FISH OIL) 1200 MG CAPS Take 1 capsule by mouth daily.      Marland Kitchen omeprazole (PRILOSEC) 20 MG capsule Take 20 mg by mouth daily.      . ramipril (ALTACE) 10 MG capsule Take 1 capsule (10 mg total) by mouth daily.  90 capsule  3  . vitamin C (ASCORBIC ACID) 500 MG tablet Take 500 mg by mouth daily.       No current facility-administered medications on file prior to visit.    Review of Systems Patient denies any headache, lightheadedness or dizziness.  No sinus or allergy symptoms.  No chest pain, tightness or palpitations.  No increased shortness of breath, cough or congestion.  No nausea or vomiting.  No acid reflux.  No abdominal pain or cramping.  No bowel change, such as diarrhea, constipation, BRBPR or melana.  No urine change.   Feels back to her baseline.  Energy is better.   Sugars attached.   Blood pressures as outlined.  Hip doing better.  Seeing Dr Marry Guan.  Doing her exercises.  Improved.        Objective:   Physical Exam  Filed  Vitals:   10/07/13 0956  BP: 120/70  Pulse: 75  Temp: 97.8 F (36.6 C)   Blood pressure recheck:  130/78, pulse 25  79 year old female in no acute distress.   HEENT:  Nares - clear.  OP- without lesions or erythema.  NECK:  Supple, nontender.  Right carotid bruit.    HEART:  Appears to be regular. LUNGS:  Without crackles or wheezing audible.  Respirations even and unlabored.   RADIAL PULSE:  Equal bilaterally.  ABDOMEN:  Soft, nontender.  No audible abdominal bruit.   EXTREMITIES:  No increased edema to be present.              FEET:  No lesions.         Assessment & Plan:  GI.   Sees Dr Tiffany Kocher.  On Prilosec.  Upper symptoms controlled.  GI wanted to do an EGD.  Wanted stress test first.  She declines stress test.  Wants to monitor for now.    HEALTH MAINTENANCE.  Physical 05/29/13.   Had a colonoscopy within the last two years.  (Dr Tiffany Kocher).  Mammogram 05/09/12 - BiRADS II.  Mammogram 06/17/13 recommended f/u views.  Follow up  views 06/22/13 - Birads II.

## 2013-10-07 NOTE — Progress Notes (Signed)
Pre-visit discussion using our clinic review tool. No additional management support is needed unless otherwise documented below in the visit note.  

## 2013-10-11 ENCOUNTER — Encounter: Payer: Self-pay | Admitting: Internal Medicine

## 2013-10-11 DIAGNOSIS — M25559 Pain in unspecified hip: Secondary | ICD-10-CM | POA: Insufficient documentation

## 2013-10-11 NOTE — Assessment & Plan Note (Signed)
Sugars have been under good control.  Follow metabolic panel and A999333.  Last a1c 6.1.

## 2013-10-11 NOTE — Assessment & Plan Note (Signed)
Has known disease.  Sees Dr Saralyn Pilar.  Wanted to do chemical stress test.  Pt declines.  Currently doing well and desires no further testing.  Follow.

## 2013-10-11 NOTE — Assessment & Plan Note (Signed)
Doing well in her tennis shoes now.  Follow.

## 2013-10-11 NOTE — Assessment & Plan Note (Signed)
Saw Dr Marry Guan.  Xray ok.  Doing exercises at home.  Better.  Follow.

## 2013-10-11 NOTE — Assessment & Plan Note (Signed)
Continue on Lipitor.  Low cholesterol diet and exercise.  Follow lipid panel and liver function.

## 2013-10-11 NOTE — Assessment & Plan Note (Addendum)
Seeing Dr Delana Meyer.  S/p right carotid surgery.  Needs the left (per pt).  Follow.  Continue aspirin.  Plavix was stopped.

## 2013-10-11 NOTE — Assessment & Plan Note (Signed)
Sees Dr Ma Hillock.  Receiving iron treatments.  Hgb improved.  Pt declines further GI w/up currently.  Follow.

## 2013-10-11 NOTE — Assessment & Plan Note (Signed)
Back on hctz 12.5mg  q day.  Pressures doing well.  Follow metabolic panel.

## 2013-10-21 ENCOUNTER — Ambulatory Visit: Payer: Self-pay | Admitting: Internal Medicine

## 2013-10-21 LAB — IRON AND TIBC
Iron Bind.Cap.(Total): 368 ug/dL (ref 250–450)
Iron: 67 ug/dL (ref 50–170)
Unbound Iron-Bind.Cap.: 301 ug/dL

## 2013-10-21 LAB — CBC CANCER CENTER
Eosinophil #: 0.2 x10 3/mm (ref 0.0–0.7)
Eosinophil %: 3.1 %
HGB: 13 g/dL (ref 12.0–16.0)
Lymphocyte #: 2.7 x10 3/mm (ref 1.0–3.6)
Lymphocyte %: 33.5 %
MCH: 31.2 pg (ref 26.0–34.0)
MCV: 94 fL (ref 80–100)
Monocyte %: 6.5 %
Neutrophil #: 4.4 x10 3/mm (ref 1.4–6.5)
Platelet: 211 x10 3/mm (ref 150–440)
RBC: 4.16 10*6/uL (ref 3.80–5.20)
RDW: 18.1 % — ABNORMAL HIGH (ref 11.5–14.5)

## 2013-10-27 ENCOUNTER — Other Ambulatory Visit (INDEPENDENT_AMBULATORY_CARE_PROVIDER_SITE_OTHER): Payer: 59

## 2013-10-27 DIAGNOSIS — I10 Essential (primary) hypertension: Secondary | ICD-10-CM

## 2013-10-27 DIAGNOSIS — D649 Anemia, unspecified: Secondary | ICD-10-CM

## 2013-10-27 DIAGNOSIS — E7800 Pure hypercholesterolemia, unspecified: Secondary | ICD-10-CM

## 2013-10-27 DIAGNOSIS — E119 Type 2 diabetes mellitus without complications: Secondary | ICD-10-CM

## 2013-10-27 LAB — BASIC METABOLIC PANEL
CO2: 28 mEq/L (ref 19–32)
Chloride: 99 mEq/L (ref 96–112)
Glucose, Bld: 153 mg/dL — ABNORMAL HIGH (ref 70–99)
Potassium: 4.1 mEq/L (ref 3.5–5.1)
Sodium: 134 mEq/L — ABNORMAL LOW (ref 135–145)

## 2013-10-27 LAB — LIPID PANEL
HDL: 42.8 mg/dL (ref >39.00)
Total CHOL/HDL Ratio: 4
Triglycerides: 205 mg/dL — ABNORMAL HIGH (ref 0.0–149.0)
VLDL: 41 mg/dL — ABNORMAL HIGH (ref 0.0–40.0)

## 2013-10-27 LAB — HEMOGLOBIN A1C: Hgb A1c MFr Bld: 7.1 % — ABNORMAL HIGH (ref 4.6–6.5)

## 2013-10-27 LAB — LDL CHOLESTEROL, DIRECT: Direct LDL: 107.7 mg/dL

## 2013-10-27 LAB — TSH: TSH: 1.7 u[IU]/mL (ref 0.35–5.50)

## 2013-10-27 LAB — HEPATIC FUNCTION PANEL: Total Bilirubin: 0.6 mg/dL (ref 0.3–1.2)

## 2013-10-28 ENCOUNTER — Other Ambulatory Visit: Payer: Self-pay | Admitting: Internal Medicine

## 2013-10-28 DIAGNOSIS — R945 Abnormal results of liver function studies: Secondary | ICD-10-CM

## 2013-10-28 DIAGNOSIS — E871 Hypo-osmolality and hyponatremia: Secondary | ICD-10-CM

## 2013-10-28 NOTE — Progress Notes (Signed)
Orders placed for f/u labs.  

## 2013-11-19 ENCOUNTER — Ambulatory Visit: Payer: Self-pay | Admitting: Internal Medicine

## 2013-11-21 ENCOUNTER — Other Ambulatory Visit: Payer: Self-pay | Admitting: Internal Medicine

## 2013-11-22 ENCOUNTER — Other Ambulatory Visit: Payer: Self-pay | Admitting: Internal Medicine

## 2013-12-30 ENCOUNTER — Other Ambulatory Visit: Payer: Self-pay | Admitting: Internal Medicine

## 2014-01-02 ENCOUNTER — Emergency Department: Payer: Self-pay | Admitting: Emergency Medicine

## 2014-01-04 ENCOUNTER — Other Ambulatory Visit (INDEPENDENT_AMBULATORY_CARE_PROVIDER_SITE_OTHER): Payer: 59

## 2014-01-04 DIAGNOSIS — R7989 Other specified abnormal findings of blood chemistry: Secondary | ICD-10-CM

## 2014-01-04 DIAGNOSIS — R945 Abnormal results of liver function studies: Secondary | ICD-10-CM

## 2014-01-04 DIAGNOSIS — E871 Hypo-osmolality and hyponatremia: Secondary | ICD-10-CM

## 2014-01-04 LAB — HEPATIC FUNCTION PANEL
ALBUMIN: 3.7 g/dL (ref 3.5–5.2)
ALK PHOS: 59 U/L (ref 39–117)
ALT: 33 U/L (ref 0–35)
AST: 28 U/L (ref 0–37)
Bilirubin, Direct: 0.1 mg/dL (ref 0.0–0.3)
TOTAL PROTEIN: 7 g/dL (ref 6.0–8.3)
Total Bilirubin: 0.6 mg/dL (ref 0.3–1.2)

## 2014-01-04 LAB — SODIUM: Sodium: 138 mEq/L (ref 135–145)

## 2014-01-06 ENCOUNTER — Encounter: Payer: Self-pay | Admitting: *Deleted

## 2014-02-08 ENCOUNTER — Encounter: Payer: Self-pay | Admitting: Internal Medicine

## 2014-02-08 ENCOUNTER — Ambulatory Visit (INDEPENDENT_AMBULATORY_CARE_PROVIDER_SITE_OTHER): Payer: 59 | Admitting: Internal Medicine

## 2014-02-08 VITALS — BP 130/60 | HR 68 | Temp 98.1°F | Ht 58.5 in | Wt 143.0 lb

## 2014-02-08 DIAGNOSIS — E119 Type 2 diabetes mellitus without complications: Secondary | ICD-10-CM

## 2014-02-08 DIAGNOSIS — I10 Essential (primary) hypertension: Secondary | ICD-10-CM

## 2014-02-08 DIAGNOSIS — E7800 Pure hypercholesterolemia, unspecified: Secondary | ICD-10-CM

## 2014-02-08 DIAGNOSIS — I739 Peripheral vascular disease, unspecified: Secondary | ICD-10-CM

## 2014-02-08 DIAGNOSIS — I779 Disorder of arteries and arterioles, unspecified: Secondary | ICD-10-CM

## 2014-02-08 DIAGNOSIS — I2510 Atherosclerotic heart disease of native coronary artery without angina pectoris: Secondary | ICD-10-CM

## 2014-02-08 DIAGNOSIS — D649 Anemia, unspecified: Secondary | ICD-10-CM

## 2014-02-08 DIAGNOSIS — M25559 Pain in unspecified hip: Secondary | ICD-10-CM

## 2014-02-08 NOTE — Assessment & Plan Note (Signed)
Sugars have been under good control.  Follow metabolic panel and A999333.  Last a1c 7.1 (10/27/13).  Low carb diet.

## 2014-02-08 NOTE — Progress Notes (Signed)
Pre-visit discussion using our clinic review tool. No additional management support is needed unless otherwise documented below in the visit note.  

## 2014-02-08 NOTE — Assessment & Plan Note (Signed)
Back on hctz 12.5mg  q day.  Pressures doing well.  Follow metabolic panel.

## 2014-02-08 NOTE — Assessment & Plan Note (Signed)
Has known disease.  Sees Dr Saralyn Pilar.  Wanted to do chemical stress test.  Pt declines.  Currently doing well and desires no further testing.  Follow.

## 2014-02-08 NOTE — Assessment & Plan Note (Signed)
Continue on Lipitor.  Low cholesterol diet and exercise.  Follow lipid panel and liver function.

## 2014-02-08 NOTE — Assessment & Plan Note (Signed)
Sees Dr Ma Hillock.  Receiving iron treatments.  Hgb improved.  Pt declines further GI w/up currently.  Follow.   Last hgb 13 - 10/21/13.   Due f/u next week.

## 2014-02-08 NOTE — Progress Notes (Signed)
Subjective:    Patient ID: Jocelyn Elliott, female    DOB: 02-Jul-1932, 80 y.o.   MRN: [id number]  HPI 80 year old female with past history of CAD, carotid disease s/p carotid endarterectomy, hypertension, hypercholesterolemia and diabetes.  She comes in today for a scheduled follow up. Has been receiving IV iron treatments at Dr Candelaria Stagers.  Hgb back up now.  Feels better.  Feels back to her baseline.  Saw Dr Saralyn Pilar.  Wanted to do a chemical stress test.  Since feeling better, she declined to have a chemical stress test.  GI wanted to do an EGD, but wanted her to have a stress test first.  She wanted to hold on any further testing.  Feels good. Blood pressure was elevated.  We started her back on her HCTZ. She is tolerating.  Blood pressures better.  Had tendonitis.  Saw Dr Elvina Mattes.  Had a cast for two weeks.  Now doing well with tennis shoes.  She saw Dr Marry Guan for left hip pain.  He recommended therapy.  She is doing exercises at home. Xray ok.  States her blood pressure has been doing well.  Blood sugars averaging 116-140 fasting.  Increased stress taking care of a female friend.  Feels she is handling things relatively well.  Some fatigue.  Continues to f/u with Dr Ma Hillock regularly.  Last hgb 10/21/13 - 13.  Due f/u next week.     Past Medical History  Diagnosis Date  . Arthritis   . Depression   . Diabetes mellitus   . Hypertension   . Hyperlipidemia   . Blood in stool   . CAD (coronary artery disease)   . Carotid arterial disease     Current Outpatient Prescriptions on File Prior to Visit  Medication Sig Dispense Refill  . aspirin 81 MG tablet Take 81 mg by mouth daily.      Marland Kitchen atorvastatin (LIPITOR) 40 MG tablet TAKE 1 TABLET (40 MG) DAILY AND THEN ALTERNATE NEXT DAY WITH ONE-HALF (1/2) TABLET (20 MG)  90 tablet  2  . cetirizine (ZYRTEC) 10 MG tablet Take 10 mg by mouth daily.      . hydrochlorothiazide (MICROZIDE) 12.5 MG capsule TAKE 1 CAPSULE DAILY  90 capsule  2  . metFORMIN  (GLUCOPHAGE-XR) 500 MG 24 hr tablet TAKE 1 TABLET DAILY  90 tablet  1  . metoprolol succinate (TOPROL-XL) 100 MG 24 hr tablet TAKE 1 TABLET TWICE A DAY  180 tablet  1  . Omega-3 Fatty Acids (FISH OIL) 1200 MG CAPS Take 1 capsule by mouth daily.      Marland Kitchen omeprazole (PRILOSEC) 20 MG capsule Take 20 mg by mouth daily.      . ramipril (ALTACE) 10 MG capsule Take 1 capsule (10 mg total) by mouth daily.  90 capsule  3   No current facility-administered medications on file prior to visit.    Review of Systems Patient denies any headache, lightheadedness or dizziness.  No sinus or allergy symptoms.  No chest pain, tightness or palpitations.  No increased shortness of breath, cough or congestion.  No nausea or vomiting.  No acid reflux.  No abdominal pain or cramping.  No bowel change, such as diarrhea, constipation, BRBPR or melana.  No urine change.   Sugars as outlined.  Blood pressures doing well.  Still having some issues with her left hip.  Sees Dr Marry Guan.  Recommended continuing exercises.  Doing home exercises.      Objective:   Physical Exam  Filed Vitals:   02/08/14 1000  BP: 130/60  Pulse: 68  Temp: 98.1 F (36.7 C)   Blood pressure recheck:  43/74  80 year old female in no acute distress.   HEENT:  Nares - clear.  OP- without lesions or erythema.  NECK:  Supple, nontender.  Right carotid bruit.    HEART:  Appears to be regular - I-II/VI systolic murmur.   LUNGS:  Without crackles or wheezing audible.  Respirations even and unlabored.   RADIAL PULSE:  Equal bilaterally.  ABDOMEN:  Soft, nontender.  No audible abdominal bruit.   EXTREMITIES:  No increased edema to be present.              FEET:  No lesions.         Assessment & Plan:  GI.   Sees Dr Tiffany Kocher.  On Prilosec.  Upper symptoms controlled.  GI wanted to do an EGD.  Wanted stress test first.  She declines stress test.  Wants to monitor for now.    HEALTH MAINTENANCE.  Physical 05/29/13.   Had a colonoscopy within the last  two years.  (Dr Tiffany Kocher).  Mammogram 05/09/12 - BiRADS II.  Mammogram 06/17/13 recommended f/u views.  Follow up views 06/22/13 - Birads II.

## 2014-02-08 NOTE — Assessment & Plan Note (Signed)
Saw Dr Marry Guan.  Xray ok.  Doing exercises at home.  Follow.

## 2014-02-08 NOTE — Assessment & Plan Note (Signed)
Seeing Dr Delana Meyer.  S/p right carotid surgery.  Needs the left (per pt).  Follow.  Continue aspirin.  Plavix was stopped.  Just evaluated 12/15/13 - recommended 6 month f/u.

## 2014-02-18 ENCOUNTER — Ambulatory Visit: Payer: Self-pay | Admitting: Internal Medicine

## 2014-02-18 ENCOUNTER — Other Ambulatory Visit: Payer: 59

## 2014-02-19 LAB — IRON AND TIBC
IRON BIND. CAP.(TOTAL): 386 ug/dL (ref 250–450)
Iron Saturation: 11 %
Iron: 42 ug/dL — ABNORMAL LOW (ref 50–170)
Unbound Iron-Bind.Cap.: 344 ug/dL

## 2014-02-19 LAB — CANCER CENTER HEMOGLOBIN: HGB: 11.2 g/dL — ABNORMAL LOW (ref 12.0–16.0)

## 2014-02-19 LAB — FERRITIN: FERRITIN (ARMC): 12 ng/mL (ref 8–388)

## 2014-02-21 ENCOUNTER — Other Ambulatory Visit: Payer: Self-pay | Admitting: Internal Medicine

## 2014-02-24 ENCOUNTER — Other Ambulatory Visit (INDEPENDENT_AMBULATORY_CARE_PROVIDER_SITE_OTHER): Payer: 59

## 2014-02-24 DIAGNOSIS — E119 Type 2 diabetes mellitus without complications: Secondary | ICD-10-CM

## 2014-02-24 DIAGNOSIS — E7800 Pure hypercholesterolemia, unspecified: Secondary | ICD-10-CM

## 2014-02-24 DIAGNOSIS — I10 Essential (primary) hypertension: Secondary | ICD-10-CM

## 2014-02-24 LAB — HEPATIC FUNCTION PANEL
ALT: 26 U/L (ref 0–35)
AST: 31 U/L (ref 0–37)
Albumin: 3.5 g/dL (ref 3.5–5.2)
Alkaline Phosphatase: 48 U/L (ref 39–117)
BILIRUBIN TOTAL: 0.5 mg/dL (ref 0.3–1.2)
Bilirubin, Direct: 0 mg/dL (ref 0.0–0.3)
Total Protein: 6.3 g/dL (ref 6.0–8.3)

## 2014-02-24 LAB — LIPID PANEL
Cholesterol: 149 mg/dL (ref 0–200)
HDL: 38.4 mg/dL — AB (ref >39.00)
LDL Cholesterol: 69 mg/dL (ref 0–99)
Total CHOL/HDL Ratio: 4
Triglycerides: 207 mg/dL — ABNORMAL HIGH (ref 0.0–149.0)
VLDL: 41.4 mg/dL — AB (ref 0.0–40.0)

## 2014-02-24 LAB — BASIC METABOLIC PANEL
BUN: 25 mg/dL — ABNORMAL HIGH (ref 6–23)
CO2: 25 meq/L (ref 19–32)
Calcium: 9 mg/dL (ref 8.4–10.5)
Chloride: 102 mEq/L (ref 96–112)
Creatinine, Ser: 1 mg/dL (ref 0.4–1.2)
GFR: 55.46 mL/min — ABNORMAL LOW (ref >60.00)
GLUCOSE: 182 mg/dL — AB (ref 70–99)
POTASSIUM: 4.4 meq/L (ref 3.5–5.1)
SODIUM: 138 meq/L (ref 135–145)

## 2014-02-24 LAB — HEMOGLOBIN A1C: Hgb A1c MFr Bld: 7.4 % — ABNORMAL HIGH (ref 4.6–6.5)

## 2014-03-01 ENCOUNTER — Encounter: Payer: Self-pay | Admitting: *Deleted

## 2014-03-19 ENCOUNTER — Ambulatory Visit: Payer: Self-pay | Admitting: Internal Medicine

## 2014-05-19 ENCOUNTER — Encounter: Payer: Self-pay | Admitting: Internal Medicine

## 2014-05-19 ENCOUNTER — Ambulatory Visit (INDEPENDENT_AMBULATORY_CARE_PROVIDER_SITE_OTHER): Payer: Medicare Other | Admitting: Internal Medicine

## 2014-05-19 VITALS — BP 140/70 | HR 73 | Temp 98.0°F | Wt 144.5 lb

## 2014-05-19 DIAGNOSIS — D649 Anemia, unspecified: Secondary | ICD-10-CM

## 2014-05-19 DIAGNOSIS — I10 Essential (primary) hypertension: Secondary | ICD-10-CM

## 2014-05-19 DIAGNOSIS — R3915 Urgency of urination: Secondary | ICD-10-CM

## 2014-05-19 DIAGNOSIS — N3000 Acute cystitis without hematuria: Secondary | ICD-10-CM

## 2014-05-19 LAB — POCT URINALYSIS DIPSTICK
Bilirubin, UA: NEGATIVE
GLUCOSE UA: NEGATIVE
KETONES UA: NEGATIVE
Nitrite, UA: NEGATIVE
Protein, UA: NEGATIVE
SPEC GRAV UA: 1.01
Urobilinogen, UA: 0.2
pH, UA: 6.5

## 2014-05-19 MED ORDER — AMOXICILLIN 875 MG PO TABS: 875.0000 mg | ORAL_TABLET | Freq: Two times a day (BID) | ORAL | Status: DC

## 2014-05-19 MED ORDER — PENCICLOVIR 1 % EX CREA: 1.0000 "application " | TOPICAL_CREAM | CUTANEOUS | Status: DC

## 2014-05-19 NOTE — Progress Notes (Signed)
Pre visit review using our clinic review tool, if applicable. No additional management support is needed unless otherwise documented below in the visit note. 

## 2014-05-22 ENCOUNTER — Encounter: Payer: Self-pay | Admitting: Internal Medicine

## 2014-05-22 DIAGNOSIS — N390 Urinary tract infection, site not specified: Secondary | ICD-10-CM | POA: Insufficient documentation

## 2014-05-22 NOTE — Assessment & Plan Note (Signed)
Sees Dr Ma Hillock.  Receiving iron treatments.  Hgb improved.  Pt declines further GI w/up currently.  Follow.

## 2014-05-22 NOTE — Assessment & Plan Note (Signed)
Back on hctz 12.5mg  q day.  Pressures doing well.  Follow metabolic panel.

## 2014-05-22 NOTE — Assessment & Plan Note (Signed)
Had some intolerance to macrobid.  Reviewed culture results at [facility].  Urine dip c/w uti.  Send for culture.  Treat with amoxicillin 875mg  as directed.  Follow.  Notify me or be reevaluated if persistent symptoms.

## 2014-05-22 NOTE — Progress Notes (Signed)
Subjective:    Patient ID: Jocelyn Elliott, female    DOB: [birth year], 80 y.o.   MRN: [id number]  Urinary Tract Infection   80 year old female with past history of CAD, carotid disease s/p carotid endarterectomy, hypertension, hypercholesterolemia and diabetes.  She comes in today as a work in with concerns regarding persistent symptoms c/w uti.  States on 05/02/14 she was sick.  Had diarrhea.  Subsequently developed urinary urgency.  Increased fluid intake.  No fever.  Went to acute care.  Was diagnosed with a uti and given macrobid.  Finished this on 05/12/14.  Felt better.  States macrobid caused headache.  Headache resolved once she completed the macrobid.  Feels symptoms are returning now. No diarrhea.  No abdominal pain or cramping.  No back pain.  Eating and drinking well.     Past Medical History  Diagnosis Date  . Arthritis   . Depression   . Diabetes mellitus   . Hypertension   . Hyperlipidemia   . Blood in stool   . CAD (coronary artery disease)   . Carotid arterial disease     Current Outpatient Prescriptions on File Prior to Visit  Medication Sig Dispense Refill  . aspirin 81 MG tablet Take 81 mg by mouth daily.      Marland Kitchen atorvastatin (LIPITOR) 40 MG tablet TAKE 1 TABLET (40 MG) DAILY AND THEN ALTERNATE NEXT DAY WITH ONE-HALF (1/2) TABLET (20 MG)  90 tablet  2  . cetirizine (ZYRTEC) 10 MG tablet Take 10 mg by mouth daily.      . hydrochlorothiazide (MICROZIDE) 12.5 MG capsule TAKE 1 CAPSULE DAILY  90 capsule  2  . metFORMIN (GLUCOPHAGE-XR) 500 MG 24 hr tablet TAKE 1 TABLET DAILY  90 tablet  1  . metoprolol succinate (TOPROL-XL) 100 MG 24 hr tablet TAKE 1 TABLET TWICE A DAY  180 tablet  1  . Omega-3 Fatty Acids (FISH OIL) 1200 MG CAPS Take 1 capsule by mouth daily.      Marland Kitchen omeprazole (PRILOSEC) 20 MG capsule Take 20 mg by mouth daily.      . ramipril (ALTACE) 10 MG capsule Take 1 capsule (10 mg total) by mouth daily.  90 capsule  3   No current facility-administered medications  on file prior to visit.    Review of Systems Patient denies any headache, lightheadedness or dizziness.  No fever.  Eating and drinking well.  No nausea or vomiting.  No bowel change.  No further diarrhea.  Urinary urgency as outlined.  Some dysuria.       Objective:   Physical Exam  Filed Vitals:   05/19/14 0809  BP: 140/70  Pulse: 73  Temp: 98 F (50.55 C)   80 year old female in no acute distress.   HEART:  Appears to be regular - I-II/VI systolic murmur.   LUNGS:  Without crackles or wheezing audible.  Respirations even and unlabored.  ABDOMEN:  Soft, nontender.  BACK:  No CVA tenderness.  Non tender.        Assessment & Plan:  GI.   Sees Dr Tiffany Kocher.  On Prilosec.  Upper symptoms controlled.  GI wanted to do an EGD.  Wanted stress test first.  She declines stress test.  Wants to monitor for now.    HEALTH MAINTENANCE.  Physical 05/29/13.   Had a colonoscopy within the last two years.  (Dr Tiffany Kocher).  Mammogram 05/09/12 - BiRADS II.  Mammogram 06/17/13 recommended f/u views.  Follow up views 06/22/13 -  Birads II.

## 2014-06-08 ENCOUNTER — Encounter: Payer: Self-pay | Admitting: Internal Medicine

## 2014-06-08 ENCOUNTER — Ambulatory Visit (INDEPENDENT_AMBULATORY_CARE_PROVIDER_SITE_OTHER): Payer: Medicare Other | Admitting: Internal Medicine

## 2014-06-08 VITALS — BP 162/82 | HR 78 | Temp 97.6°F | Ht 58.5 in | Wt 143.5 lb

## 2014-06-08 DIAGNOSIS — E119 Type 2 diabetes mellitus without complications: Secondary | ICD-10-CM

## 2014-06-08 DIAGNOSIS — N390 Urinary tract infection, site not specified: Secondary | ICD-10-CM

## 2014-06-08 DIAGNOSIS — I779 Disorder of arteries and arterioles, unspecified: Secondary | ICD-10-CM

## 2014-06-08 DIAGNOSIS — I2510 Atherosclerotic heart disease of native coronary artery without angina pectoris: Secondary | ICD-10-CM

## 2014-06-08 DIAGNOSIS — N3000 Acute cystitis without hematuria: Secondary | ICD-10-CM

## 2014-06-08 DIAGNOSIS — Z1239 Encounter for other screening for malignant neoplasm of breast: Secondary | ICD-10-CM

## 2014-06-08 DIAGNOSIS — I10 Essential (primary) hypertension: Secondary | ICD-10-CM

## 2014-06-08 DIAGNOSIS — D649 Anemia, unspecified: Secondary | ICD-10-CM

## 2014-06-08 DIAGNOSIS — E7800 Pure hypercholesterolemia, unspecified: Secondary | ICD-10-CM

## 2014-06-08 DIAGNOSIS — I739 Peripheral vascular disease, unspecified: Secondary | ICD-10-CM

## 2014-06-08 LAB — URINALYSIS, ROUTINE W REFLEX MICROSCOPIC
Bilirubin Urine: NEGATIVE
Hgb urine dipstick: NEGATIVE
Ketones, ur: NEGATIVE
NITRITE: NEGATIVE
PH: 6.5 (ref 5.0–8.0)
Specific Gravity, Urine: <=1.005 — AB (ref 1.000–1.030)
TOTAL PROTEIN, URINE-UPE24: NEGATIVE
Urine Glucose: NEGATIVE
Urobilinogen, UA: 0.2 (ref 0.0–1.0)

## 2014-06-08 NOTE — Progress Notes (Signed)
Pre visit review using our clinic review tool, if applicable. No additional management support is needed unless otherwise documented below in the visit note. 

## 2014-06-10 LAB — CULTURE, URINE COMPREHENSIVE: Colony Count: 40000

## 2014-06-13 ENCOUNTER — Encounter: Payer: Self-pay | Admitting: Internal Medicine

## 2014-06-13 NOTE — Assessment & Plan Note (Signed)
Continue on Lipitor.  Low cholesterol diet and exercise.  Follow lipid panel and liver function.

## 2014-06-13 NOTE — Assessment & Plan Note (Signed)
Seeing Dr Delana Meyer.  S/p right carotid surgery.  Needs the left (per pt).  Follow.  Continue aspirin.  Plavix was stopped.  Last evaluated 12/15/13 - recommended 6 month f/u.  Is scheduled for f/u tomorrow.

## 2014-06-13 NOTE — Assessment & Plan Note (Signed)
Sees Dr Ma Hillock.  Receiving iron treatments.  Hgb improved.  Pt declines further GI w/up currently.  Follow.  Has f/u with Dr Ma Hillock 06/21/14.

## 2014-06-13 NOTE — Assessment & Plan Note (Signed)
Still with some urgency.  Will check urinalysis and culture and treat if needed.

## 2014-06-13 NOTE — Assessment & Plan Note (Addendum)
On hctz 12.5mg  q day.  Pressures as outlined.  Follow metabolic panel.  Overall blood pressure ok.  Treat uti.  See if improves with treatment.  Follow.  Will get her back in soon to follow up regarding her blood pressure.

## 2014-06-13 NOTE — Assessment & Plan Note (Signed)
Sugars have been under good control.  Follow metabolic panel and A999333.  Low carb diet.

## 2014-06-13 NOTE — Assessment & Plan Note (Signed)
Has known disease.  Sees Dr Saralyn Pilar.  Wanted to do chemical stress test.  Pt declines.  Currently doing well and desires no further testing.  Follow.

## 2014-06-13 NOTE — Progress Notes (Signed)
Subjective:    Patient ID: Jocelyn Elliott, female    DOB: 27-Aug-1932, 80 y.o.   MRN: [id number]  HPI 80 year old female with past history of CAD, carotid disease s/p carotid endarterectomy, hypertension, hypercholesterolemia and diabetes.  She comes in today to follow up on these issues as well as for a complete physical exam.  Has been receiving IV iron treatments at Dr Candelaria Stagers.  Hgb back up now.  Feels better.  Feels back to her baseline.  Saw Dr Saralyn Pilar.  Wanted to do a chemical stress test.  Since feeling better, she declined to have a chemical stress test.  GI wanted to do an EGD, but wanted her to have a stress test first.  She wanted to hold on any further testing.  Feels good.  Blood pressure previously elevated.  We started her back on her HCTZ.   She is tolerating.  Blood pressures better, but recently up some.  Feels related her recent UTI.  Was evaluated here recently and diagnosed with uti.  Treated.  Comes in today and states feels better.  Still with some urgency.    She saw Dr Marry Guan for left hip pain.  He recommended therapy.  She is doing exercises at home. Xray ok.  States her blood pressures are averaging [id number] systolic readings.  Blood sugars averaging 120-150.   Continues to f/u with Dr Ma Hillock regularly.  Due f/u 06/21/14.  Seeing Dr Delana Meyer tomorrow. Has noticed no blood in her stool.      Past Medical History  Diagnosis Date  . Arthritis   . Depression   . Diabetes mellitus   . Hypertension   . Hyperlipidemia   . Blood in stool   . CAD (coronary artery disease)   . Carotid arterial disease     Current Outpatient Prescriptions on File Prior to Visit  Medication Sig Dispense Refill  . aspirin 81 MG tablet Take 81 mg by mouth daily.      Marland Kitchen atorvastatin (LIPITOR) 40 MG tablet TAKE 1 TABLET (40 MG) DAILY AND THEN ALTERNATE NEXT DAY WITH ONE-HALF (1/2) TABLET (20 MG)  90 tablet  2  . cetirizine (ZYRTEC) 10 MG tablet Take 10 mg by mouth daily.      . hydrochlorothiazide  (MICROZIDE) 12.5 MG capsule TAKE 1 CAPSULE DAILY  90 capsule  2  . metFORMIN (GLUCOPHAGE-XR) 500 MG 24 hr tablet TAKE 1 TABLET DAILY  90 tablet  1  . metoprolol succinate (TOPROL-XL) 100 MG 24 hr tablet TAKE 1 TABLET TWICE A DAY  180 tablet  1  . Omega-3 Fatty Acids (FISH OIL) 1200 MG CAPS Take 1 capsule by mouth daily.      Marland Kitchen omeprazole (PRILOSEC) 20 MG capsule Take 20 mg by mouth daily.      Marland Kitchen penciclovir (DENAVIR) 1 % cream Apply 1 application topically every 2 (two) hours.  1.5 g  0  . ramipril (ALTACE) 10 MG capsule Take 1 capsule (10 mg total) by mouth daily.  90 capsule  3   No current facility-administered medications on file prior to visit.    Review of Systems Patient denies any headache, lightheadedness or dizziness.  No sinus or allergy symptoms.  No chest pain, tightness or palpitations.  No increased shortness of breath, cough or congestion.  No nausea or vomiting.  No acid reflux.  No abdominal pain or cramping.  No bowel change, such as diarrhea, constipation, BRBPR or melana.  Urinary urgency as outlined.  Sugars as outlined.  Blood pressures as outlined.  Due to f/u with Dr Ronalee Belts tomorrow.       Objective:   Physical Exam  Filed Vitals:   06/08/14 1330  BP: 162/82  Pulse: 78  Temp: 97.6 F (36.4 C)   Blood pressure recheck:  74/47  80 year old female in no acute distress.   HEENT:  Nares- clear.  Oropharynx - without lesions. NECK:  Supple.  Nontender.  No audible bruit.  HEART:  Appears to be regular. LUNGS:  No crackles or wheezing audible.  Respirations even and unlabored.  RADIAL PULSE:  Equal bilaterally.    BREASTS:  No nipple discharge or nipple retraction present.  Could not appreciate any distinct nodules or axillary adenopathy.  ABDOMEN:  Soft, nontender.  Bowel sounds present and normal.  No audible abdominal bruit.  GU:  Not performed.     EXTREMITIES:  No increased edema present.  DP pulses palpable and equal bilaterally.      SKIN:  No lesions.           Assessment & Plan:  GI.   Sees Dr Tiffany Kocher.  On Prilosec.  Upper symptoms controlled.  GI wanted to do an EGD.  Wanted stress test first.  She declines stress test.  Wants to monitor for now.    HEALTH MAINTENANCE.  Physical today.   Had a colonoscopy within the last two years.  (Dr Tiffany Kocher).  Mammogram 05/09/12 - BiRADS II.  Mammogram 06/17/13 recommended f/u views.  Follow up views 06/22/13 - Birads II.  Schedule f/u mammogram.    I spent 25 minutes with patient and more than 50% of the time was spent in consultation regarding the above.

## 2014-06-21 ENCOUNTER — Ambulatory Visit: Payer: Self-pay | Admitting: Internal Medicine

## 2014-06-21 LAB — IRON AND TIBC
IRON SATURATION: 22 %
Iron Bind.Cap.(Total): 371 ug/dL (ref 250–450)
Iron: 80 ug/dL (ref 50–170)
Unbound Iron-Bind.Cap.: 291 ug/dL

## 2014-06-21 LAB — FERRITIN: Ferritin (ARMC): 23 ng/mL (ref 8–388)

## 2014-06-21 LAB — CANCER CENTER HEMOGLOBIN: HGB: 12.3 g/dL (ref 12.0–16.0)

## 2014-06-28 ENCOUNTER — Other Ambulatory Visit: Payer: Self-pay | Admitting: Internal Medicine

## 2014-07-02 ENCOUNTER — Other Ambulatory Visit (INDEPENDENT_AMBULATORY_CARE_PROVIDER_SITE_OTHER): Payer: Medicare Other

## 2014-07-02 DIAGNOSIS — E7800 Pure hypercholesterolemia, unspecified: Secondary | ICD-10-CM

## 2014-07-02 DIAGNOSIS — E119 Type 2 diabetes mellitus without complications: Secondary | ICD-10-CM

## 2014-07-02 LAB — HEPATIC FUNCTION PANEL
ALT: 35 U/L (ref 0–35)
AST: 35 U/L (ref 0–37)
Albumin: 3.7 g/dL (ref 3.5–5.2)
Alkaline Phosphatase: 56 U/L (ref 39–117)
Bilirubin, Direct: 0.1 mg/dL (ref 0.0–0.3)
Total Bilirubin: 0.8 mg/dL (ref 0.2–1.2)
Total Protein: 6.6 g/dL (ref 6.0–8.3)

## 2014-07-02 LAB — BASIC METABOLIC PANEL
BUN: 21 mg/dL (ref 6–23)
CALCIUM: 10 mg/dL (ref 8.4–10.5)
CO2: 27 meq/L (ref 19–32)
CREATININE: 0.9 mg/dL (ref 0.4–1.2)
Chloride: 101 mEq/L (ref 96–112)
GFR: 61.65 mL/min (ref >60.00)
Glucose, Bld: 126 mg/dL — ABNORMAL HIGH (ref 70–99)
Potassium: 3.9 mEq/L (ref 3.5–5.1)
SODIUM: 136 meq/L (ref 135–145)

## 2014-07-02 LAB — LIPID PANEL
Cholesterol: 177 mg/dL (ref 0–200)
HDL: 44 mg/dL (ref >39.00)
LDL Cholesterol: 102 mg/dL — ABNORMAL HIGH (ref 0–99)
NonHDL: 133
Total CHOL/HDL Ratio: 4
Triglycerides: 156 mg/dL — ABNORMAL HIGH (ref 0.0–149.0)
VLDL: 31.2 mg/dL (ref 0.0–40.0)

## 2014-07-02 LAB — HEMOGLOBIN A1C: HEMOGLOBIN A1C: 7.6 % — AB (ref 4.6–6.5)

## 2014-07-05 ENCOUNTER — Encounter: Payer: Self-pay | Admitting: *Deleted

## 2014-07-12 ENCOUNTER — Telehealth: Payer: Self-pay | Admitting: *Deleted

## 2014-07-12 NOTE — Telephone Encounter (Signed)
Pt called states she has had diarrhea since Thursday, states she is taking Probiotics and liquid Imodium with little relief.  Please advise

## 2014-07-12 NOTE — Telephone Encounter (Signed)
With her GI history and persistent loose stool, she needs evaluation.  Acute care today - has history of GI bleed and has been unable to tell in the past if losing iron.  They can check cbc, etc and get her f/u with Dr Tiffany Kocher if needed.

## 2014-07-12 NOTE — Telephone Encounter (Signed)
Pt notified & states that she will head over to acute care now

## 2014-07-19 ENCOUNTER — Encounter: Payer: Medicare Other | Admitting: Adult Health

## 2014-07-20 ENCOUNTER — Ambulatory Visit: Payer: Self-pay | Admitting: Internal Medicine

## 2014-07-20 ENCOUNTER — Ambulatory Visit: Payer: Medicare Other | Admitting: Internal Medicine

## 2014-07-28 ENCOUNTER — Other Ambulatory Visit: Payer: Self-pay | Admitting: Internal Medicine

## 2014-07-29 ENCOUNTER — Ambulatory Visit (INDEPENDENT_AMBULATORY_CARE_PROVIDER_SITE_OTHER): Payer: Medicare Other | Admitting: Internal Medicine

## 2014-07-29 ENCOUNTER — Encounter: Payer: Self-pay | Admitting: Internal Medicine

## 2014-07-29 ENCOUNTER — Other Ambulatory Visit: Payer: Self-pay | Admitting: Internal Medicine

## 2014-07-29 VITALS — BP 142/80 | HR 78 | Temp 98.1°F | Ht 58.5 in | Wt 143.0 lb

## 2014-07-29 DIAGNOSIS — I2510 Atherosclerotic heart disease of native coronary artery without angina pectoris: Secondary | ICD-10-CM

## 2014-07-29 DIAGNOSIS — D649 Anemia, unspecified: Secondary | ICD-10-CM

## 2014-07-29 DIAGNOSIS — I779 Disorder of arteries and arterioles, unspecified: Secondary | ICD-10-CM

## 2014-07-29 DIAGNOSIS — I739 Peripheral vascular disease, unspecified: Secondary | ICD-10-CM

## 2014-07-29 DIAGNOSIS — I10 Essential (primary) hypertension: Secondary | ICD-10-CM

## 2014-07-29 DIAGNOSIS — E7800 Pure hypercholesterolemia, unspecified: Secondary | ICD-10-CM

## 2014-07-29 DIAGNOSIS — E119 Type 2 diabetes mellitus without complications: Secondary | ICD-10-CM

## 2014-07-29 DIAGNOSIS — M25552 Pain in left hip: Secondary | ICD-10-CM

## 2014-07-29 DIAGNOSIS — M25559 Pain in unspecified hip: Secondary | ICD-10-CM

## 2014-07-29 NOTE — Progress Notes (Signed)
Pre visit review using our clinic review tool, if applicable. No additional management support is needed unless otherwise documented below in the visit note. 

## 2014-08-01 ENCOUNTER — Encounter: Payer: Self-pay | Admitting: Internal Medicine

## 2014-08-01 NOTE — Assessment & Plan Note (Signed)
Sees Dr Ma Hillock.  Receiving iron treatments.  Hgb improved.  Pt declines further GI w/up.  Follow.

## 2014-08-01 NOTE — Assessment & Plan Note (Signed)
Continue on Lipitor.  Low cholesterol diet and exercise.  Follow lipid panel and liver function.

## 2014-08-01 NOTE — Assessment & Plan Note (Signed)
On hctz 12.5mg  q day.  Pressures as outlined.  Follow metabolic panel.

## 2014-08-01 NOTE — Assessment & Plan Note (Signed)
Has known disease.  Sees Dr Saralyn Pilar.  Wanted to do chemical stress test.  Pt declines.  Currently doing well and desires no further testing.  Follow.

## 2014-08-01 NOTE — Assessment & Plan Note (Signed)
Sugars have been under reasonable control.  Follow metabolic panel and [id number].  Low carb diet.  A1c 7.6 - 07/02/14.

## 2014-08-01 NOTE — Assessment & Plan Note (Signed)
Saw Dr Marry Guan.  Xray ok.  Doing exercises at home.  Follow.

## 2014-08-01 NOTE — Assessment & Plan Note (Signed)
Seeing Dr Delana Meyer.  S/p right carotid surgery.  Needs the left (per pt).  Follow.  Continue aspirin.  Plavix was stopped.  Last evaluated 06/09/14 - carotid artery duplex shows patent right ICA and 50-69% left carotid endarterectomy.  No significant change from 11/2013.

## 2014-08-01 NOTE — Progress Notes (Signed)
Subjective:    Patient ID: Jocelyn Elliott, female    DOB: 1932-04-16, 80 y.o.   MRN: [id number]  HPI 80 year old female with past history of CAD, carotid disease s/p carotid endarterectomy, hypertension, hypercholesterolemia and diabetes.  She comes in today for a scheduled follow up. Has been receiving IV iron treatments at Dr Candelaria Stagers.  Hgb back up now.  Feels better.  Feels back to her baseline.  Saw Dr Saralyn Pilar.  Wanted to do a chemical stress test.  Since feeling better, she declined to have a chemical stress test.  GI wanted to do an EGD, but wanted her to have a stress test first.  She wanted to hold on any further testing.  Feels good.  Blood pressure previously elevated. We started her back on her HCTZ.   Blood pressures better.   Was evaluated here recently and diagnosed with uti.  Treated. Has had a few courses of antibiotics recently.  Feels better now.   She saw Dr Marry Guan for left hip pain.  He recommended therapy.  She is doing exercises at home. Xray ok.  Blood sugars averaging 120-140 am.  A1c last checked 07/02/14 - 7.6.    Continues to f/u with Dr Ma Hillock regularly.       Past Medical History  Diagnosis Date  . Arthritis   . Depression   . Diabetes mellitus   . Hypertension   . Hyperlipidemia   . Blood in stool   . CAD (coronary artery disease)   . Carotid arterial disease     Current Outpatient Prescriptions on File Prior to Visit  Medication Sig Dispense Refill  . aspirin 81 MG tablet Take 81 mg by mouth daily.      Marland Kitchen atorvastatin (LIPITOR) 40 MG tablet TAKE 1 TABLET (40 MG) DAILY AND THEN ALTERNATE NEXT DAY WITH ONE-HALF (1/2) TABLET (20 MG)  90 tablet  1  . cetirizine (ZYRTEC) 10 MG tablet Take 10 mg by mouth daily.      . hydrochlorothiazide (MICROZIDE) 12.5 MG capsule TAKE 1 CAPSULE DAILY  90 capsule  1  . metoprolol succinate (TOPROL-XL) 100 MG 24 hr tablet TAKE 1 TABLET TWICE A DAY  180 tablet  1  . Omega-3 Fatty Acids (FISH OIL) 1200 MG CAPS Take 1 capsule by mouth  daily.      Marland Kitchen omeprazole (PRILOSEC) 20 MG capsule Take 20 mg by mouth daily.      . ramipril (ALTACE) 10 MG capsule Take 1 capsule (10 mg total) by mouth daily.  90 capsule  3   No current facility-administered medications on file prior to visit.    Review of Systems Patient denies any headache, lightheadedness or dizziness.  No sinus or allergy symptoms.  No chest pain, tightness or palpitations.  No increased shortness of breath, cough or congestion.  No nausea or vomiting.  No acid reflux.  No abdominal pain or cramping.  No bowel change, such as diarrhea, constipation, BRBPR or melana.  No urinary issues now.  Sugars as outlined.        Objective:   Physical Exam  Filed Vitals:   07/29/14 1114  BP: 142/80  Pulse: 78  Temp: 98.1 F (36.7 C)   Blood pressure recheck:  31/63  81 year old female in no acute distress.   HEENT:  Nares- clear.  Oropharynx - without lesions. NECK:  Supple.  Nontender.  No audible bruit.  HEART:  Appears to be regular. LUNGS:  No crackles or wheezing audible.  Respirations even and unlabored.  RADIAL PULSE:  Equal bilaterally.  ABDOMEN:  Soft, nontender.  Bowel sounds present and normal.  No audible abdominal bruit.     EXTREMITIES:  No increased edema present.  DP pulses palpable and equal bilaterally.      SKIN:  No lesions.          Assessment & Plan:  GI.   Sees Dr Tiffany Kocher.  On Prilosec.  Upper symptoms controlled.  GI wanted to do an EGD.  Wanted stress test first.  She declines stress test.  Wants to monitor for now.    HEALTH MAINTENANCE.  Physical 06/08/14.   Had a colonoscopy within the last two years.  (Dr Tiffany Kocher).  Mammogram 05/09/12 - BiRADS II.  Mammogram 06/17/13 recommended f/u views.  Follow up views 06/22/13 - Birads II.  Has mammogram scheduled.

## 2014-10-20 ENCOUNTER — Ambulatory Visit: Payer: Self-pay | Admitting: Internal Medicine

## 2014-10-20 LAB — CBC CANCER CENTER
Basophil #: 0.1 x10 3/mm (ref 0.0–0.1)
Basophil %: 1.3 %
EOS ABS: 0.2 x10 3/mm (ref 0.0–0.7)
EOS PCT: 3 %
HCT: 31 % — AB (ref 35.0–47.0)
HGB: 9.7 g/dL — AB (ref 12.0–16.0)
LYMPHS ABS: 2.1 x10 3/mm (ref 1.0–3.6)
Lymphocyte %: 26.5 %
MCH: 25.9 pg — ABNORMAL LOW (ref 26.0–34.0)
MCHC: 31.3 g/dL — AB (ref 32.0–36.0)
MCV: 83 fL (ref 80–100)
MONO ABS: 0.5 x10 3/mm (ref 0.2–0.9)
Monocyte %: 5.9 %
Neutrophil #: 5 x10 3/mm (ref 1.4–6.5)
Neutrophil %: 63.3 %
PLATELETS: 286 x10 3/mm (ref 150–440)
RBC: 3.75 10*6/uL — ABNORMAL LOW (ref 3.80–5.20)
RDW: 15.3 % — AB (ref 11.5–14.5)
WBC: 7.9 x10 3/mm (ref 3.6–11.0)

## 2014-10-20 LAB — IRON AND TIBC
IRON BIND. CAP.(TOTAL): 427 ug/dL (ref 250–450)
IRON: 31 ug/dL — AB (ref 50–170)
Iron Saturation: 7 %
Unbound Iron-Bind.Cap.: 396 ug/dL

## 2014-10-20 LAB — FERRITIN: FERRITIN (ARMC): 8 ng/mL (ref 8–388)

## 2014-10-25 ENCOUNTER — Ambulatory Visit: Payer: Self-pay | Admitting: Internal Medicine

## 2014-10-25 LAB — HM MAMMOGRAPHY: HM Mammogram: NEGATIVE

## 2014-10-26 ENCOUNTER — Encounter: Payer: Self-pay | Admitting: *Deleted

## 2014-10-27 ENCOUNTER — Other Ambulatory Visit: Payer: Self-pay | Admitting: Internal Medicine

## 2014-11-08 ENCOUNTER — Encounter: Payer: Self-pay | Admitting: Internal Medicine

## 2014-11-19 ENCOUNTER — Ambulatory Visit: Payer: Self-pay | Admitting: Internal Medicine

## 2014-12-02 ENCOUNTER — Other Ambulatory Visit: Payer: Self-pay | Admitting: Internal Medicine

## 2015-01-02 ENCOUNTER — Other Ambulatory Visit: Payer: Self-pay | Admitting: Internal Medicine

## 2015-01-14 ENCOUNTER — Ambulatory Visit: Payer: Self-pay | Admitting: Internal Medicine

## 2015-01-18 ENCOUNTER — Ambulatory Visit
Admit: 2015-01-18 | Disposition: A | Discharge status: Home / Self Care | Payer: Self-pay | Attending: Internal Medicine | Admitting: Internal Medicine

## 2015-01-23 ENCOUNTER — Other Ambulatory Visit: Payer: Self-pay | Admitting: Internal Medicine

## 2015-02-14 ENCOUNTER — Telehealth: Payer: Self-pay | Admitting: Internal Medicine

## 2015-02-14 NOTE — Telephone Encounter (Signed)
Left msg for pt to call the office toschedule appt 3/28.msn

## 2015-03-11 NOTE — Op Note (Signed)
PATIENT NAME:  Jocelyn Elliott, Jocelyn Elliott MR#:  [id number] DATE OF BIRTH:  07/25/1932  DATE OF PROCEDURE:  01/09/2013  PREOPERATIVE DIAGNOSES:  1.  Atherosclerotic occlusive disease bilateral carotid arteries.  2.  Right carotid artery kink.  3.  Hypercholesterolemia.  4.  Hypertension.   POSTOPERATIVE DIAGNOSES: 1.  Atherosclerotic occlusive disease bilateral carotid arteries.  2.  Right carotid artery kink.  3.  Hypercholesterolemia.  4.  Hypertension.   PROCEDURES PERFORMED: 1. Right carotid endarterectomy with xenograft CorMatrix patch angioplasty.  2. Repair of arterial defect with CorMatrix patch.  3. Repair of internal carotid artery kink with plication.   SURGEON: Hortencia Pilar, MD  FIRST ASSISTANT: Ms. Gillie Manners  ANESTHESIA: General by endotracheal intubation.   FLUIDS: Per anesthesia record.   ESTIMATED BLOOD LOSS: 75 mL.   SPECIMEN: Carotid plaque to pathology for permanent section.   INDICATIONS: Ms. Umfress is a 81 year old woman who presented to the office with known atherosclerotic occlusive disease of the carotid arteries. She had been followed with serial ultrasound exams and had demonstrated progression to the point of a critical stenosis. CT angiography confirmed this and also demonstrated a greater than 90 degree kink of the internal carotid artery. Given the anatomy of her arch, which was type III, as well as the kink within the internal, she was not a stent candidate and therefore she has been prepared for surgery. Risks and benefits as well as alternative therapies were reviewed, all questions answered, and the patient agrees to proceed.   DESCRIPTION OF PROCEDURE: The patient is taken to the operating room and placed in the supine position. After adequate general anesthesia is induced, appropriate invasive monitors placed, she is positioned with her neck extended slightly rotated to the left and then prepped and draped in a sterile fashion. Appropriate timeout is  called.   A curvilinear incision is created along the anterior margin of the sternocleidomastoid muscle and carried down through the soft tissues, platysma is transected. The sternocleidomastoid muscle is then reflected posterolaterally, common carotid artery is identified and dissected along its anterior margin, in a craniad direction. External carotid artery is looped with a silastic vessel loop as is the superior thyroid and the internal carotid artery is dissected to several centimeters above the kink to allow for straightening and plication. The digastric muscle is transected to allow for adequate visualization, in the superior margin.   7000 units of heparin is given and allowed to circulate for 4 minutes. Common followed by the external followed by internal carotid artery is clamped, arteriotomy is made with an 11 blade and extended with Potts scissors, and an indwelling Sundt shunt is placed without difficulty. Flow is reestablished to the brain.   Endarterectomy is then performed under direct visualization for the common bulb and internal carotid artery. External carotid artery is treated with eversion technique. It is then flushed with heparinized saline and resecured.   Using interrupted 7-0 Prolene, the internal carotid artery is plicated to reduce the kink. After straightening of the internal carotid artery is completed, approximately 6 interrupted sutures were utilized, CorMatrix patch is delivered onto the field, it is beveled to an appropriate shape and then applied to repair the arterial defect using running 6-0 Prolene in a four-quadrant technique. Appropriate flushing maneuvers are performed, the shunt is removed and the suture line is completed. Flow is then reestablished first to the external carotid artery and then the internal carotid artery to prevent distal embolization. Suture line is then inspected for hemostasis, a  single interrupted 6-0 Prolene suture is used to secure 1 site,  hemostasis is achieved, and the wound is then closed using 3-0 Vicryl, in a running fashion for platysma, followed by 4-0 Monocryl for     the skin, in a subcuticular, and then Dermabond. The patient tolerated the procedure well. There were no complications. She will be awakened in the operating room and then brought to the recovery room after moving all extremities and obeying simple commands.  ____________________________ Katha Cabal, MD ggs:sb D: 01/09/2013 09:53:35 ET T: 01/09/2013 13:31:22 ET JOB#: [id number]  cc: Katha Cabal, MD, <Dictator> Rodena Goldmann III, MD Einar Pheasant, MD Katha Cabal MD ELECTRONICALLY SIGNED 01/13/2013 11:24

## 2015-03-11 NOTE — Discharge Summary (Signed)
PATIENT NAME:  Jocelyn Elliott, Jocelyn Elliott MR#:  [id number] DATE OF BIRTH:  1931-11-30  DATE OF ADMISSION:  01/09/2013 DATE OF DISCHARGE:  01/10/2013  DISCHARGE DIAGNOSIS:  Critical stenosis of the right internal carotid artery associated with a greater than 90 degree kink of the internal carotid artery.   SECONDARY DIAGNOSES:  Diabetes, hypertension, hypercholesterolemia.   SURGICAL PROCEDURES:  Right carotid endarterectomy with plication and repair using CorMatrix patch, date 01/09/2013.   CONSULTATIONS:  None.   HISTORY OF PRESENT ILLNESS:  The patient has been followed in the office and has progressed to critical stenosis of the right internal carotid artery.  She is therefore undergoing repair.  Risks and benefits were reviewed.  All questions answered and the patient has agreed to proceed.   HOSPITAL COURSE:  On day of admission, the patient underwent successful repair of her critical stenosis of the right internal carotid artery.  She had a normal postoperative course and is discharged on postoperative day #1.    DISCHARGE INSTRUCTIONS AND MEDICATIONS:  As per the [facility] discharge note.     ____________________________ Katha Cabal, MD ggs:ea D: 01/10/2013 11:32:28 ET T: 01/11/2013 00:35:17 ET JOB#: [id number]  cc: Katha Cabal, MD, <Dictator> Katha Cabal MD ELECTRONICALLY SIGNED 01/13/2013 11:24

## 2015-04-01 ENCOUNTER — Other Ambulatory Visit: Payer: Self-pay | Admitting: Internal Medicine

## 2015-04-06 ENCOUNTER — Ambulatory Visit (INDEPENDENT_AMBULATORY_CARE_PROVIDER_SITE_OTHER): Payer: Medicare Other | Admitting: Nurse Practitioner

## 2015-04-06 ENCOUNTER — Encounter: Payer: Self-pay | Admitting: Nurse Practitioner

## 2015-04-06 ENCOUNTER — Telehealth: Payer: Self-pay | Admitting: Internal Medicine

## 2015-04-06 VITALS — BP 164/82 | HR 65 | Temp 97.9°F | Resp 12 | Wt 143.8 lb

## 2015-04-06 DIAGNOSIS — F411 Generalized anxiety disorder: Secondary | ICD-10-CM | POA: Diagnosis not present

## 2015-04-06 DIAGNOSIS — R002 Palpitations: Secondary | ICD-10-CM

## 2015-04-06 MED ORDER — ALPRAZOLAM 0.25 MG PO TABS: 0.2500 mg | ORAL_TABLET | Freq: Two times a day (BID) | ORAL | Status: DC | PRN

## 2015-04-06 NOTE — Progress Notes (Signed)
Pre visit review using our clinic review tool, if applicable. No additional management support is needed unless otherwise documented below in the visit note. 

## 2015-04-06 NOTE — Telephone Encounter (Signed)
Jocelyn Elliott this is your 430 appt. Today. Please advise.

## 2015-04-06 NOTE — Telephone Encounter (Signed)
[facility] Patient Name: Jocelyn Elliott DOB: July 13, 1932 Initial Comment Caller states bp high this morning, 201/90 left, 211/89 right, chest been hurting, feels very jittery Nurse Assessment Nurse: Markus Daft, RN, [place] Date/Time (Eastern Time): 04/06/2015 10:49:33 AM Confirm and document reason for call. If symptomatic, describe symptoms. ---Caller states BP high this morning, 201/90 left, 211/89 right as checked at 10:30 am. c/o chest feels jittery. Arms feel fine. Her boyfriend was buried on Monday after fight with [place]. She has been really said. She feels a little anxious and upset today. Denies HA, or SOB. Has the patient traveled out of the country within the last 30 days? ---Not Applicable Does the patient require triage? ---Yes Related visit to physician within the last 2 weeks? ---No Appt with Dr. Nicki Reaper scheduled in June. Does the PT have any chronic conditions? (i.e. diabetes, asthma, etc.) ---Yes List chronic conditions. ---HTN, NIDDM Guidelines Guideline Title Affirmed Question Affirmed Notes High Blood Pressure [1] BP # 200/120 AND [2] having NO cardiac or neurologic symptoms jittery feeling in last 2 days off/on; and now feels a little better after crying Final Disposition User See Physician within 4 Hours (or PCP triage) Markus Daft, RN, [place] Comments: Appt made with Diamantina Monks NP today at 4:30 pm (her PCP was not in office this afternoon).

## 2015-04-06 NOTE — Patient Instructions (Signed)
Take 2 as needed for anxiety. Follow up with Dr. Nicki Reaper in June.   Try at bedtime first to see how drowsy it will make you. Sorry for your loss.

## 2015-04-06 NOTE — Progress Notes (Signed)
Subjective:    Patient ID: Jocelyn Elliott, female    DOB: 1932-03-10, 81 y.o.   MRN: [id number]  HPI  [name] is a 81 yo female with a CC of BP, feels jittery, anxious and upset today. TH scheduled appointment.   1) 201/90 with pulse of 72 approx this morning on left arm,  211/89 on right with p 71   186/85 66 L later  Pt reports chest feels jittery, denies arm or jaw complaints, she just lost her boyfriend of several years on Monday. She is sad, anxious, and more upset today.   Denies trying anything to help with this. Has good support system of friends/family.   Review of Systems  Constitutional: Negative for fever, chills, diaphoresis and fatigue.  Eyes: Negative for visual disturbance.  Respiratory: Negative for chest tightness, shortness of breath and wheezing.   Cardiovascular: Positive for palpitations. Negative for chest pain and leg swelling.  Gastrointestinal: Negative for nausea, vomiting and diarrhea.  Skin: Negative for rash.  Neurological: Negative for dizziness, weakness, numbness and headaches.  Psychiatric/Behavioral: The patient is nervous/anxious.    Past Medical History  Diagnosis Date  . Arthritis   . Depression   . Diabetes mellitus   . Hypertension   . Hyperlipidemia   . Blood in stool   . CAD (coronary artery disease)   . Carotid arterial disease     History   Social History  . Marital Status: Widowed    Spouse Name: N/A  . Number of Children: 1  . Years of Education: 13   Occupational History  . Retired    Social History Main Topics  . Smoking status: Former Smoker    Quit date: 11/19/1989  . Smokeless tobacco: Never Used  . Alcohol Use: No  . Drug Use: No  . Sexual Activity: Not on file   Other Topics Concern  . Not on file   Social History Narrative   Regular exercise-no   Caffeine Use-yes    Past Surgical History  Procedure Laterality Date  . Cholecystectomy  1993  . Appendectomy  1992  . Abdominal hysterectomy   1972    ovaries left in place  . Total hip arthroplasty  05/03/2011    left 12, right 11/07  . Back surgery  06/08/2010    L3, L4, L5  . Foot surgery  06/2007    Left  . Carpal tunnel release  1991, 92    right then left   . Carotid artery angioplasty  11/03  . Right carotid artery surgery  01/09/13    Dr. Delana Meyer @ [facility]    Family History  Problem Relation Age of Onset  . Arthritis Other     parent  . Hyperlipidemia Father   . Heart disease Father     myocardial infarction  . Hypertension Father     Parent  . Diabetes Other     nephew  . Cervical cancer Sister   . Breast cancer Neg Hx     Allergies  Allergen Reactions  . Ciprofloxacin   . Levaquin [Levofloxacin]   . Tequin [Gatifloxacin] Hives and Rash    Current Outpatient Prescriptions on File Prior to Visit  Medication Sig Dispense Refill  . aspirin 81 MG tablet Take 81 mg by mouth daily.    Marland Kitchen atorvastatin (LIPITOR) 40 MG tablet TAKE 1 TABLET DAILY AND THEN ALTERNATE NEXT DAY WITH ONE-HALF (1/2) TABLET (20 MG) 90 tablet 0  . cetirizine (ZYRTEC) 10 MG tablet  Take 10 mg by mouth daily.    . hydrochlorothiazide (MICROZIDE) 12.5 MG capsule TAKE 1 CAPSULE DAILY 90 capsule 1  . metoprolol succinate (TOPROL-XL) 100 MG 24 hr tablet TAKE 1 TABLET TWICE A DAY 180 tablet 1  . Omega-3 Fatty Acids (FISH OIL) 1200 MG CAPS Take 1 capsule by mouth daily.    Marland Kitchen omeprazole (PRILOSEC) 20 MG capsule Take 20 mg by mouth daily.    . ramipril (ALTACE) 10 MG capsule Take 1 capsule (10 mg total) by mouth daily. 90 capsule 3   No current facility-administered medications on file prior to visit.       Objective:   Physical Exam  Constitutional: She is oriented to person, place, and time. She appears well-developed and well-nourished. No distress.  BP 164/82 mmHg  Pulse 65  Temp(Src) 97.9 F (36.6 C) (Oral)  Resp 12  Wt 143 lb 12.8 oz (65.227 kg)  SpO2 97%   HENT:  Head: Normocephalic and atraumatic.  Right Ear: External ear  normal.  Left Ear: External ear normal.  Cardiovascular: Normal rate, regular rhythm and normal heart sounds.  Exam reveals no gallop and no friction rub.   No murmur heard. Pulmonary/Chest: Effort normal and breath sounds normal. No respiratory distress. She has no wheezes. She has no rales. She exhibits no tenderness.  Neurological: She is alert and oriented to person, place, and time. No cranial nerve deficit. She exhibits normal muscle tone. Coordination normal.  Skin: Skin is warm and dry. No rash noted. She is not diaphoretic.  Psychiatric: She has a normal mood and affect. Her behavior is normal. Judgment and thought content normal.  Tearful when talking about recent loss of friend.       Assessment & Plan:

## 2015-04-22 ENCOUNTER — Other Ambulatory Visit: Payer: Self-pay | Admitting: Internal Medicine

## 2015-04-22 NOTE — Telephone Encounter (Signed)
Has up coming appt on 6.13.16

## 2015-04-23 DIAGNOSIS — F411 Generalized anxiety disorder: Secondary | ICD-10-CM | POA: Insufficient documentation

## 2015-04-23 NOTE — Assessment & Plan Note (Addendum)
Anxiety due to grief from recent loss. EKG showed no changes in T waves. Pt is visibly sad. Will try xanax 0.25 mg at night to help with jittery, anxious, and racing thoughts. Will follow up in 1 month with Dr. Nicki Reaper

## 2015-04-30 ENCOUNTER — Other Ambulatory Visit: Payer: Self-pay | Admitting: Nurse Practitioner

## 2015-05-02 ENCOUNTER — Encounter: Payer: Self-pay | Admitting: Internal Medicine

## 2015-05-02 ENCOUNTER — Ambulatory Visit (INDEPENDENT_AMBULATORY_CARE_PROVIDER_SITE_OTHER): Payer: Medicare Other | Admitting: Internal Medicine

## 2015-05-02 VITALS — BP 111/60 | HR 70 | Temp 97.7°F | Resp 18 | Ht 58.5 in | Wt 142.4 lb

## 2015-05-02 DIAGNOSIS — I10 Essential (primary) hypertension: Secondary | ICD-10-CM | POA: Diagnosis not present

## 2015-05-02 DIAGNOSIS — E7800 Pure hypercholesterolemia, unspecified: Secondary | ICD-10-CM

## 2015-05-02 DIAGNOSIS — I779 Disorder of arteries and arterioles, unspecified: Secondary | ICD-10-CM | POA: Diagnosis not present

## 2015-05-02 DIAGNOSIS — E119 Type 2 diabetes mellitus without complications: Secondary | ICD-10-CM

## 2015-05-02 DIAGNOSIS — I2510 Atherosclerotic heart disease of native coronary artery without angina pectoris: Secondary | ICD-10-CM | POA: Diagnosis not present

## 2015-05-02 DIAGNOSIS — I739 Peripheral vascular disease, unspecified: Secondary | ICD-10-CM

## 2015-05-02 DIAGNOSIS — D649 Anemia, unspecified: Secondary | ICD-10-CM

## 2015-05-02 DIAGNOSIS — F411 Generalized anxiety disorder: Secondary | ICD-10-CM

## 2015-05-02 MED ORDER — HYDROCHLOROTHIAZIDE 12.5 MG PO CAPS: 12.5000 mg | ORAL_CAPSULE | Freq: Every day | ORAL | Status: DC

## 2015-05-02 MED ORDER — ALPRAZOLAM 0.25 MG PO TABS: 0.2500 mg | ORAL_TABLET | Freq: Every day | ORAL | Status: DC | PRN

## 2015-05-02 NOTE — Progress Notes (Signed)
Pre visit review using our clinic review tool, if applicable. No additional management support is needed unless otherwise documented below in the visit note. 

## 2015-05-02 NOTE — Progress Notes (Signed)
Patient ID: Jocelyn Elliott, female   DOB: [birth year], 81 y.o.   MRN: [id number]   Subjective:    Patient ID: Jocelyn Elliott, female    DOB: [birth year], 81 y.o.   MRN: [id number]  HPI  Patient here for a scheduled follow up.  Increased stress recently.  Her female friend just passed away.  Has been taking xanax.  Saw Morey Hummingbird recently.  Blood pressure elevated.  She prescribed xanax.  Doing well with this.  Feels some better now.  Still needing the xanax and request a refill.  Breathing stable.  Eating and drinking well.  Bowels stable.  No blood.  Blood pressures have been averaging 118-122/60s.     Past Medical History  Diagnosis Date  . Arthritis   . Depression   . Diabetes mellitus   . Hypertension   . Hyperlipidemia   . Blood in stool   . CAD (coronary artery disease)   . Carotid arterial disease     Current Outpatient Prescriptions on File Prior to Visit  Medication Sig Dispense Refill  . aspirin 81 MG tablet Take 81 mg by mouth daily.    Marland Kitchen atorvastatin (LIPITOR) 40 MG tablet TAKE 1 TABLET DAILY AND THEN ALTERNATE NEXT DAY WITH ONE-HALF (1/2) TABLET (20 MG) 90 tablet 0  . cetirizine (ZYRTEC) 10 MG tablet Take 10 mg by mouth daily.    . metFORMIN (GLUCOPHAGE-XR) 500 MG 24 hr tablet TAKE 1 TABLET DAILY 90 tablet 0  . metoprolol succinate (TOPROL-XL) 100 MG 24 hr tablet TAKE 1 TABLET TWICE A DAY 180 tablet 1  . Omega-3 Fatty Acids (FISH OIL) 1200 MG CAPS Take 1 capsule by mouth daily.    Marland Kitchen omeprazole (PRILOSEC) 20 MG capsule Take 20 mg by mouth daily.    . ramipril (ALTACE) 10 MG capsule Take 1 capsule (10 mg total) by mouth daily. 90 capsule 3   No current facility-administered medications on file prior to visit.    Review of Systems  Constitutional: Negative for appetite change and unexpected weight change.  HENT: Negative for congestion and sinus pressure.   Respiratory: Negative for cough, chest tightness and shortness of breath.   Cardiovascular: Negative for chest pain,  palpitations and leg swelling.  Gastrointestinal: Negative for nausea, vomiting, abdominal pain and diarrhea.  Skin: Negative for color change and rash.  Neurological: Negative for dizziness, light-headedness and headaches.  Psychiatric/Behavioral:       Increased stress as outlined.         Objective:     Physical Exam  Constitutional: She appears well-developed and well-nourished. No distress.  HENT:  Nose: Nose normal.  Mouth/Throat: Oropharynx is clear and moist.  Neck: Neck supple. No thyromegaly present.  Cardiovascular: Normal rate and regular rhythm.   Pulmonary/Chest: Breath sounds normal. No respiratory distress. She has no wheezes.  Abdominal: Soft. Bowel sounds are normal. There is no tenderness.  Musculoskeletal: She exhibits no edema or tenderness.  Lymphadenopathy:    She has no cervical adenopathy.  Skin: No rash noted. No erythema.  Psychiatric: She has a normal mood and affect. Her behavior is normal.    BP 111/60 mmHg  Pulse 70  Temp(Src) 97.7 F (36.5 C) (Oral)  Resp 18  Ht 4' 10.5" (1.486 m)  Wt 142 lb 6 oz (64.581 kg)  BMI 29.25 kg/m2  SpO2 93% Wt Readings from Last 3 Encounters:  05/02/15 142 lb 6 oz (64.581 kg)  04/06/15 143 lb 12.8 oz (65.227 kg)  07/29/14 143 lb (  64.864 kg)     Lab Results  Component Value Date   WBC 7.9 10/20/2014   HGB 9.7* 10/20/2014   HCT 31.0* 10/20/2014   PLT 286 10/20/2014   GLUCOSE 126* 07/02/2014   CHOL 177 07/02/2014   TRIG 156.0* 07/02/2014   HDL 44.00 07/02/2014   LDLDIRECT 107.7 10/27/2013   LDLCALC 102* 07/02/2014   ALT 35 07/02/2014   AST 35 07/02/2014   NA 136 07/02/2014   K 3.9 07/02/2014   CL 101 07/02/2014   CREATININE 0.9 07/02/2014   BUN 21 07/02/2014   CO2 27 07/02/2014   TSH 1.70 10/27/2013   INR 1.0 01/10/2013   HGBA1C 7.6* 07/02/2014   MICROALBUR 0.5 06/05/2013       Assessment & Plan:   Problem List Items Addressed This Visit    Anemia    Followed by hematology.       CAD  (coronary artery disease) - Primary    Continue risk factor modification.  Does not want to pursue further cardiac testing.  See previous note.        Relevant Medications   hydrochlorothiazide (MICROZIDE) 12.5 MG capsule   Carotid arterial disease    Continue aspirin.  Seeing Dr Delana Meyer.        Relevant Medications   hydrochlorothiazide (MICROZIDE) 12.5 MG capsule   Diabetes mellitus    Sugars have been doing well.  Low carb diet.  Follow.  Check met b and a1c.  Due to see Dr Thomasene Ripple 05/17/15.        Generalized anxiety disorder    On xanax.  This is working well for her.  Refill.  Follow.        Hypercholesteremia    Low cholesterol diet and exercise.  On lipitor.  Follow lipid panel and liver function tests.       Relevant Medications   hydrochlorothiazide (MICROZIDE) 12.5 MG capsule   Hypertension    Blood pressure doing well now.  Follow pressures.  Follow metabolic panel.        Relevant Medications   hydrochlorothiazide (MICROZIDE) 12.5 MG capsule     I spent 25 minutes with the patient and more than 50% of the time was spent in consultation regarding the above.     Einar Pheasant, MD

## 2015-05-09 ENCOUNTER — Encounter: Payer: Self-pay | Admitting: Internal Medicine

## 2015-05-09 NOTE — Assessment & Plan Note (Signed)
Followed by hematology 

## 2015-05-09 NOTE — Assessment & Plan Note (Signed)
Continue aspirin.  Seeing Dr Delana Meyer.

## 2015-05-09 NOTE — Assessment & Plan Note (Signed)
On xanax.  This is working well for her.  Refill.  Follow.

## 2015-05-09 NOTE — Assessment & Plan Note (Signed)
Continue risk factor modification.  Does not want to pursue further cardiac testing.  See previous note.

## 2015-05-09 NOTE — Assessment & Plan Note (Signed)
Low cholesterol diet and exercise.  On lipitor.  Follow lipid panel and liver function tests.   

## 2015-05-09 NOTE — Assessment & Plan Note (Signed)
Blood pressure doing well now.  Follow pressures.  Follow metabolic panel.

## 2015-05-09 NOTE — Assessment & Plan Note (Signed)
Sugars have been doing well.  Low carb diet.  Follow.  Check met b and a1c.  Due to see Dr Thomasene Ripple 05/17/15.

## 2015-05-16 ENCOUNTER — Other Ambulatory Visit: Payer: Self-pay

## 2015-05-16 DIAGNOSIS — D509 Iron deficiency anemia, unspecified: Secondary | ICD-10-CM

## 2015-05-17 ENCOUNTER — Inpatient Hospital Stay: Payer: Medicare Other | Attending: Internal Medicine

## 2015-05-17 DIAGNOSIS — D649 Anemia, unspecified: Secondary | ICD-10-CM | POA: Insufficient documentation

## 2015-05-17 DIAGNOSIS — D509 Iron deficiency anemia, unspecified: Secondary | ICD-10-CM

## 2015-05-17 LAB — IRON AND TIBC
IRON: 85 ug/dL (ref 28–170)
Saturation Ratios: 21 % (ref 10.4–31.8)
TIBC: 409 ug/dL (ref 250–450)
UIBC: 324 ug/dL

## 2015-05-17 LAB — HEMOGLOBIN: Hemoglobin: 13.2 g/dL (ref 12.0–16.0)

## 2015-05-17 LAB — FERRITIN: Ferritin: 31 ng/mL (ref 11–307)

## 2015-05-18 ENCOUNTER — Other Ambulatory Visit: Payer: Self-pay | Admitting: Internal Medicine

## 2015-05-18 ENCOUNTER — Other Ambulatory Visit (INDEPENDENT_AMBULATORY_CARE_PROVIDER_SITE_OTHER): Payer: Medicare Other

## 2015-05-18 DIAGNOSIS — I10 Essential (primary) hypertension: Secondary | ICD-10-CM

## 2015-05-18 DIAGNOSIS — R7989 Other specified abnormal findings of blood chemistry: Secondary | ICD-10-CM

## 2015-05-18 DIAGNOSIS — E119 Type 2 diabetes mellitus without complications: Secondary | ICD-10-CM

## 2015-05-18 DIAGNOSIS — E7800 Pure hypercholesterolemia, unspecified: Secondary | ICD-10-CM

## 2015-05-18 DIAGNOSIS — R945 Abnormal results of liver function studies: Secondary | ICD-10-CM

## 2015-05-18 LAB — LIPID PANEL
Cholesterol: 173 mg/dL (ref 0–200)
HDL: 42 mg/dL (ref >39.00)
LDL Cholesterol: 91 mg/dL (ref 0–99)
NonHDL: 131
Total CHOL/HDL Ratio: 4
Triglycerides: 198 mg/dL — ABNORMAL HIGH (ref 0.0–149.0)
VLDL: 39.6 mg/dL (ref 0.0–40.0)

## 2015-05-18 LAB — MICROALBUMIN / CREATININE URINE RATIO
CREATININE, U: 37.1 mg/dL
MICROALB/CREAT RATIO: 1.9 mg/g (ref 0.0–30.0)
Microalb, Ur: <0.7 mg/dL (ref 0.0–1.9)

## 2015-05-18 LAB — HEMOGLOBIN A1C: Hgb A1c MFr Bld: 8 % — ABNORMAL HIGH (ref 4.6–6.5)

## 2015-05-18 LAB — HEPATIC FUNCTION PANEL
ALK PHOS: 61 U/L (ref 39–117)
ALT: 44 U/L — ABNORMAL HIGH (ref 0–35)
AST: 44 U/L — ABNORMAL HIGH (ref 0–37)
Albumin: 4.1 g/dL (ref 3.5–5.2)
BILIRUBIN DIRECT: 0.1 mg/dL (ref 0.0–0.3)
TOTAL PROTEIN: 7.1 g/dL (ref 6.0–8.3)
Total Bilirubin: 0.6 mg/dL (ref 0.2–1.2)

## 2015-05-18 LAB — BASIC METABOLIC PANEL
BUN: 17 mg/dL (ref 6–23)
CHLORIDE: 98 meq/L (ref 96–112)
CO2: 30 mEq/L (ref 19–32)
CREATININE: 1.04 mg/dL (ref 0.40–1.20)
Calcium: 9.6 mg/dL (ref 8.4–10.5)
GFR: 54.06 mL/min — AB (ref >60.00)
Glucose, Bld: 170 mg/dL — ABNORMAL HIGH (ref 70–99)
POTASSIUM: 4.2 meq/L (ref 3.5–5.1)
Sodium: 137 mEq/L (ref 135–145)

## 2015-05-18 LAB — TSH: TSH: 2.39 u[IU]/mL (ref 0.35–4.50)

## 2015-05-18 NOTE — Progress Notes (Signed)
Order placed for f/u liver panel.  

## 2015-05-19 ENCOUNTER — Encounter: Payer: Self-pay | Admitting: *Deleted

## 2015-05-26 ENCOUNTER — Other Ambulatory Visit (INDEPENDENT_AMBULATORY_CARE_PROVIDER_SITE_OTHER): Payer: Medicare Other

## 2015-05-26 DIAGNOSIS — R7989 Other specified abnormal findings of blood chemistry: Secondary | ICD-10-CM

## 2015-05-26 DIAGNOSIS — R945 Abnormal results of liver function studies: Secondary | ICD-10-CM

## 2015-05-26 LAB — HEPATIC FUNCTION PANEL
ALK PHOS: 55 U/L (ref 39–117)
ALT: 37 U/L — ABNORMAL HIGH (ref 0–35)
AST: 34 U/L (ref 0–37)
Albumin: 3.8 g/dL (ref 3.5–5.2)
BILIRUBIN TOTAL: 0.6 mg/dL (ref 0.2–1.2)
Bilirubin, Direct: 0.1 mg/dL (ref 0.0–0.3)
TOTAL PROTEIN: 6.8 g/dL (ref 6.0–8.3)

## 2015-05-27 ENCOUNTER — Other Ambulatory Visit: Payer: Self-pay | Admitting: Internal Medicine

## 2015-05-27 DIAGNOSIS — R7989 Other specified abnormal findings of blood chemistry: Secondary | ICD-10-CM

## 2015-05-27 DIAGNOSIS — R945 Abnormal results of liver function studies: Secondary | ICD-10-CM

## 2015-05-27 NOTE — Progress Notes (Signed)
Order placed for f/u liver test 

## 2015-05-29 ENCOUNTER — Other Ambulatory Visit: Payer: Self-pay | Admitting: Internal Medicine

## 2015-06-01 ENCOUNTER — Other Ambulatory Visit: Payer: Self-pay | Admitting: Internal Medicine

## 2015-06-01 NOTE — Telephone Encounter (Signed)
Last OV [phone], next OV 8,[phone]. Please advise refill

## 2015-06-01 NOTE — Telephone Encounter (Signed)
ok'd refill for xanax #30 with no refills.

## 2015-06-03 ENCOUNTER — Other Ambulatory Visit: Payer: Self-pay | Admitting: Internal Medicine

## 2015-06-06 NOTE — Telephone Encounter (Signed)
Rx faxed

## 2015-06-12 ENCOUNTER — Other Ambulatory Visit: Payer: Self-pay

## 2015-06-12 ENCOUNTER — Emergency Department: Payer: Medicare Other

## 2015-06-12 ENCOUNTER — Emergency Department
Admission: EM | Admit: 2015-06-12 | Discharge: 2015-06-12 | Disposition: A | Discharge status: Home / Self Care | Payer: Medicare Other | Attending: Emergency Medicine | Admitting: Emergency Medicine

## 2015-06-12 ENCOUNTER — Encounter: Payer: Self-pay | Admitting: Emergency Medicine

## 2015-06-12 DIAGNOSIS — N12 Tubulo-interstitial nephritis, not specified as acute or chronic: Secondary | ICD-10-CM | POA: Insufficient documentation

## 2015-06-12 DIAGNOSIS — Z7982 Long term (current) use of aspirin: Secondary | ICD-10-CM | POA: Insufficient documentation

## 2015-06-12 DIAGNOSIS — E119 Type 2 diabetes mellitus without complications: Secondary | ICD-10-CM | POA: Insufficient documentation

## 2015-06-12 DIAGNOSIS — I10 Essential (primary) hypertension: Secondary | ICD-10-CM | POA: Diagnosis not present

## 2015-06-12 DIAGNOSIS — Z79899 Other long term (current) drug therapy: Secondary | ICD-10-CM | POA: Diagnosis not present

## 2015-06-12 DIAGNOSIS — R109 Unspecified abdominal pain: Secondary | ICD-10-CM | POA: Diagnosis present

## 2015-06-12 DIAGNOSIS — R52 Pain, unspecified: Secondary | ICD-10-CM

## 2015-06-12 DIAGNOSIS — Z87891 Personal history of nicotine dependence: Secondary | ICD-10-CM | POA: Diagnosis not present

## 2015-06-12 LAB — CBC WITH DIFFERENTIAL/PLATELET
BASOS PCT: 1 %
Basophils Absolute: 0.1 10*3/uL (ref 0–0.1)
Eosinophils Absolute: 0.2 10*3/uL (ref 0–0.7)
Eosinophils Relative: 3 %
HEMATOCRIT: 38.3 % (ref 35.0–47.0)
Hemoglobin: 12.7 g/dL (ref 12.0–16.0)
LYMPHS PCT: 29 %
Lymphs Abs: 1.7 10*3/uL (ref 1.0–3.6)
MCH: 30.4 pg (ref 26.0–34.0)
MCHC: 33.3 g/dL (ref 32.0–36.0)
MCV: 91.5 fL (ref 80.0–100.0)
MONO ABS: 0.3 10*3/uL (ref 0.2–0.9)
Monocytes Relative: 5 %
NEUTROS ABS: 3.7 10*3/uL (ref 1.4–6.5)
NEUTROS PCT: 62 %
Platelets: 189 10*3/uL (ref 150–440)
RBC: 4.18 MIL/uL (ref 3.80–5.20)
RDW: 13.3 % (ref 11.5–14.5)
WBC: 6 10*3/uL (ref 3.6–11.0)

## 2015-06-12 LAB — URINALYSIS COMPLETE WITH MICROSCOPIC (ARMC ONLY)
Bilirubin Urine: NEGATIVE
Glucose, UA: NEGATIVE mg/dL
HGB URINE DIPSTICK: NEGATIVE
Ketones, ur: NEGATIVE mg/dL
Nitrite: NEGATIVE
PROTEIN: NEGATIVE mg/dL
SPECIFIC GRAVITY, URINE: 1.006 (ref 1.005–1.030)
SQUAMOUS EPITHELIAL / LPF: NONE SEEN
pH: 7 (ref 5.0–8.0)

## 2015-06-12 LAB — BASIC METABOLIC PANEL
Anion gap: 9 (ref 5–15)
BUN: 12 mg/dL (ref 6–20)
CALCIUM: 9.3 mg/dL (ref 8.9–10.3)
CO2: 26 mmol/L (ref 22–32)
Chloride: 102 mmol/L (ref 101–111)
Creatinine, Ser: 0.98 mg/dL (ref 0.44–1.00)
GFR, EST NON AFRICAN AMERICAN: 53 mL/min — AB (ref >60)
Glucose, Bld: 172 mg/dL — ABNORMAL HIGH (ref 65–99)
Potassium: 4.6 mmol/L (ref 3.5–5.1)
Sodium: 137 mmol/L (ref 135–145)

## 2015-06-12 MED ORDER — CEPHALEXIN 500 MG PO CAPS
1000.0000 mg | ORAL_CAPSULE | Freq: Once | ORAL | Status: AC
  Administered 2015-06-12: 1000 mg via ORAL
  Filled 2015-06-12: qty 2

## 2015-06-12 MED ORDER — ONDANSETRON HCL 4 MG/2ML IJ SOLN
4.0000 mg | Freq: Once | INTRAMUSCULAR | Status: AC
  Administered 2015-06-12: 4 mg via INTRAVENOUS
  Filled 2015-06-12: qty 2

## 2015-06-12 MED ORDER — MORPHINE SULFATE 2 MG/ML IJ SOLN: 2.0000 mg | Freq: Once | INTRAMUSCULAR | Status: DC

## 2015-06-12 MED ORDER — CEPHALEXIN 500 MG PO CAPS: 500.0000 mg | ORAL_CAPSULE | Freq: Four times a day (QID) | ORAL | Status: AC

## 2015-06-12 MED ORDER — OXYCODONE-ACETAMINOPHEN 5-325 MG PO TABS: 1.0000 | ORAL_TABLET | Freq: Four times a day (QID) | ORAL | Status: DC | PRN

## 2015-06-12 MED ORDER — MORPHINE SULFATE 2 MG/ML IJ SOLN
2.0000 mg | Freq: Once | INTRAMUSCULAR | Status: AC
  Administered 2015-06-12: 2 mg via INTRAVENOUS
  Filled 2015-06-12: qty 1

## 2015-06-12 NOTE — ED Notes (Signed)
Pt c/o R flank pain since Friday. She denies pain with urination. States she thinks its a kidney stone, though no history of this. Pt denes fevers.

## 2015-06-12 NOTE — ED Provider Notes (Signed)
[facility] Emergency Department Provider Note  ____________________________________________  Time seen: Approximately 1:19 PM  I have reviewed the triage vital signs and the nursing notes.   HISTORY  Chief Complaint Flank Pain    HPI Jocelyn Elliott is a 81 y.o. female patient reports for the last several days she's been having right sided CVA pain which has spread down towards the groin as a renal stone wouldn't up into the back a little bit as well. She has become somewhat nauseated but has not vomited. She is not having any diarrhea. She is not running a fever having any coughing or chills. Patient feels she might have a kidney stone although she has never had one in the past. The pain has been getting worse every day. Nothing really seems to make it much better or worse. Been drinking a lot of water in an attempt to make the "stone" past.   Past Medical History  Diagnosis Date  . Arthritis   . Depression   . Diabetes mellitus   . Hypertension   . Hyperlipidemia   . Blood in stool   . CAD (coronary artery disease)   . Carotid arterial disease     Patient Active Problem List   Diagnosis Date Noted  . Generalized anxiety disorder 04/23/2015  . UTI (urinary tract infection) 05/22/2014  . Hip pain 10/11/2013  . Tendonitis 07/19/2013  . Leg numbness 04/07/2013  . CAD (coronary artery disease) 09/05/2012  . Hypertension 09/05/2012  . Hypercholesteremia 09/05/2012  . Diabetes mellitus 09/05/2012  . Carotid arterial disease 09/05/2012  . Anemia 09/05/2012    Past Surgical History  Procedure Laterality Date  . Cholecystectomy  1993  . Appendectomy  1992  . Abdominal hysterectomy  1972    ovaries left in place  . Total hip arthroplasty  05/03/2011    left 12, right 11/07  . Back surgery  06/08/2010    L3, L4, L5  . Foot surgery  06/2007    Left  . Carpal tunnel release  1991, 92    right then left   . Carotid artery angioplasty  11/03  .  Right carotid artery surgery  01/09/13    Dr. Delana Meyer @ [facility]    Current Outpatient Rx  Name  Route  Sig  Dispense  Refill  . ALPRAZolam (XANAX) 0.25 MG tablet      TAKE 1 TABLET BY MOUTH EVERY DAY AS NEEDED FOR ANXIETY   30 tablet   0   . aspirin 81 MG tablet   Oral   Take 81 mg by mouth daily.         Marland Kitchen atorvastatin (LIPITOR) 40 MG tablet      TAKE 1 TABLET DAILY AND THEN ALTERNATE NEXT DAY WITH ONE-HALF (1/2) TABLET (20 MG)   90 tablet   0     Needs office visit for further refills.   . cephALEXin (KEFLEX) 500 MG capsule   Oral   Take 1 capsule (500 mg total) by mouth 4 (four) times daily.   40 capsule   0   . cetirizine (ZYRTEC) 10 MG tablet   Oral   Take 10 mg by mouth daily.         . hydrochlorothiazide (MICROZIDE) 12.5 MG capsule   Oral   Take 1 capsule (12.5 mg total) by mouth daily.   90 capsule   3   . metFORMIN (GLUCOPHAGE-XR) 500 MG 24 hr tablet      TAKE 1  TABLET DAILY   90 tablet   0   . metoprolol succinate (TOPROL-XL) 100 MG 24 hr tablet      TAKE 1 TABLET TWICE A DAY   180 tablet   1   . Omega-3 Fatty Acids (FISH OIL) 1200 MG CAPS   Oral   Take 1 capsule by mouth daily.         Marland Kitchen omeprazole (PRILOSEC) 20 MG capsule   Oral   Take 20 mg by mouth daily.         Marland Kitchen oxyCODONE-acetaminophen (ROXICET) 5-325 MG per tablet   Oral   Take 1 tablet by mouth every 6 (six) hours as needed for severe pain.   5 tablet   0   . ramipril (ALTACE) 10 MG capsule   Oral   Take 1 capsule (10 mg total) by mouth daily.   90 capsule   3     Allergies Ciprofloxacin; Levaquin; and Tequin  Family History  Problem Relation Age of Onset  . Arthritis Other     parent  . Hyperlipidemia Father   . Heart disease Father     myocardial infarction  . Hypertension Father     Parent  . Diabetes Other     nephew  . Cervical cancer Sister   . Breast cancer Neg Hx     Social History History  Substance Use Topics  . Smoking status: Former  Smoker    Quit date: 11/19/1989  . Smokeless tobacco: Never Used  . Alcohol Use: No    Review of Systems Constitutional: No fever/chills Eyes: No visual changes. ENT: No sore throat. Cardiovascular: Denies chest pain. Respiratory: Denies shortness of breath. Gastrointestinal: No abdominal pain.  No nausea, no vomiting.  No diarrhea.  No constipation. Genitourinary: Negative for dysuria. Musculoskeletal: Negative for back pain. Skin: Negative for rash. Neurological: Negative for headaches, focal weakness or numbness.  10-point ROS otherwise negative.  ____________________________________________   PHYSICAL EXAM:  VITAL SIGNS: ED Triage Vitals  Enc Vitals Group     BP 06/12/15 1033 167/93 mmHg     Pulse Rate 06/12/15 1033 63     Resp 06/12/15 1033 18     Temp 06/12/15 1033 98.2 F (36.8 C)     Temp Source 06/12/15 1033 Oral     SpO2 06/12/15 1033 94 %     Weight 06/12/15 1033 141 lb (63.957 kg)     Height 06/12/15 1033 4\' 11"  (1.499 m)     Head Cir --      Peak Flow --      Pain Score 06/12/15 1034 8     Pain Loc --      Pain Edu? --      Excl. in [place]? --    Constitutional: Alert and oriented. Well appearing and in no acute distress. Eyes: Conjunctivae are normal. PERRL. EOMI. Head: Atraumatic. Nose: No congestion/rhinnorhea. Mouth/Throat: Mucous membranes are moist.  Oropharynx non-erythematous. Neck: No stridor. Cardiovascular: Normal rate, regular rhythm. Grossly normal heart sounds.  Good peripheral circulation. Respiratory: Normal respiratory effort.  No retractions. Lungs CTAB. Gastrointestinal: Soft and nontender. No distention. No abdominal bruits. No CVA tenderness. Musculoskeletal: No lower extremity tenderness nor edema.  No joint effusions. Patient does complain of some pain in the just below the right CVA area there is little bit of tenderness to percussion there. Straight leg raise is negative. Neurologic:  Normal speech and language. No gross focal  neurologic deficits are appreciated. No gait instability. Skin:  Skin is warm, dry and intact. No rash noted. Psychiatric: Mood and affect are normal. Speech and behavior are normal.  ____________________________________________   LABS (all labs ordered are listed, but only abnormal results are displayed)  Labs Reviewed  URINALYSIS COMPLETEWITH MICROSCOPIC ([place]) - Abnormal; Notable for the following:    Color, Urine YELLOW (*)    APPearance CLEAR (*)    Leukocytes, UA 3+ (*)    Bacteria, UA MANY (*)    All other components within normal limits  BASIC METABOLIC PANEL - Abnormal; Notable for the following:    Glucose, Bld 172 (*)    GFR calc non Af Amer 53 (*)    All other components within normal limits  URINE CULTURE  CBC WITH DIFFERENTIAL/PLATELET  HEPATIC FUNCTION PANEL   ____________________________________________  EKG  EKG read and interpreted by me shows shows sinus bradycardia at a rate of 54 left axis. No acute changes. ____________________________________________  RADIOLOGY  CT shows no stone or hydronephrosis. ____________________________________________   PROCEDURES    ____________________________________________   INITIAL IMPRESSION / ASSESSMENT AND PLAN / ED COURSE  Pertinent labs & imaging results that were available during my care of the patient were reviewed by me and considered in my medical decision making (see chart for details).   ____________________________________________   FINAL CLINICAL IMPRESSION(S) / ED DIAGNOSES  Final diagnoses:  Pain  Pyelonephritis      Nena Polio, MD 06/12/15 1323

## 2015-06-14 LAB — URINE CULTURE
Culture: >=100000
Special Requests: NORMAL

## 2015-06-17 ENCOUNTER — Encounter: Payer: Self-pay | Admitting: *Deleted

## 2015-06-22 ENCOUNTER — Other Ambulatory Visit: Payer: Self-pay | Admitting: Internal Medicine

## 2015-06-22 ENCOUNTER — Other Ambulatory Visit (INDEPENDENT_AMBULATORY_CARE_PROVIDER_SITE_OTHER): Payer: Medicare Other

## 2015-06-22 ENCOUNTER — Encounter: Payer: Self-pay | Admitting: *Deleted

## 2015-06-22 DIAGNOSIS — N3000 Acute cystitis without hematuria: Secondary | ICD-10-CM

## 2015-06-22 DIAGNOSIS — R945 Abnormal results of liver function studies: Secondary | ICD-10-CM

## 2015-06-22 DIAGNOSIS — R7989 Other specified abnormal findings of blood chemistry: Secondary | ICD-10-CM | POA: Diagnosis not present

## 2015-06-22 DIAGNOSIS — Z1239 Encounter for other screening for malignant neoplasm of breast: Secondary | ICD-10-CM

## 2015-06-22 LAB — URINALYSIS, ROUTINE W REFLEX MICROSCOPIC
Bilirubin Urine: NEGATIVE
Hgb urine dipstick: NEGATIVE
KETONES UR: NEGATIVE
Leukocytes, UA: NEGATIVE
NITRITE: NEGATIVE
RBC / HPF: NONE SEEN (ref 0–?)
Specific Gravity, Urine: <=1.005 — AB (ref 1.000–1.030)
Total Protein, Urine: NEGATIVE
URINE GLUCOSE: NEGATIVE
Urobilinogen, UA: 0.2 (ref 0.0–1.0)
pH: 5.5 (ref 5.0–8.0)

## 2015-06-22 LAB — HEPATIC FUNCTION PANEL
ALBUMIN: 4.1 g/dL (ref 3.5–5.2)
ALT: 29 U/L (ref 0–35)
AST: 34 U/L (ref 0–37)
Alkaline Phosphatase: 58 U/L (ref 39–117)
BILIRUBIN DIRECT: 0.1 mg/dL (ref 0.0–0.3)
TOTAL PROTEIN: 6.5 g/dL (ref 6.0–8.3)
Total Bilirubin: 0.4 mg/dL (ref 0.2–1.2)

## 2015-06-22 NOTE — Progress Notes (Signed)
Order placed for f/u urine and culture.  

## 2015-06-23 LAB — CULTURE, URINE COMPREHENSIVE
Colony Count: NO GROWTH
ORGANISM ID, BACTERIA: NO GROWTH

## 2015-06-29 ENCOUNTER — Other Ambulatory Visit: Payer: Self-pay | Admitting: Internal Medicine

## 2015-07-18 ENCOUNTER — Ambulatory Visit (INDEPENDENT_AMBULATORY_CARE_PROVIDER_SITE_OTHER): Payer: Medicare Other | Admitting: Internal Medicine

## 2015-07-18 ENCOUNTER — Encounter: Payer: Self-pay | Admitting: Internal Medicine

## 2015-07-18 DIAGNOSIS — I10 Essential (primary) hypertension: Secondary | ICD-10-CM

## 2015-07-18 DIAGNOSIS — I2510 Atherosclerotic heart disease of native coronary artery without angina pectoris: Secondary | ICD-10-CM | POA: Diagnosis not present

## 2015-07-18 DIAGNOSIS — I739 Peripheral vascular disease, unspecified: Secondary | ICD-10-CM

## 2015-07-18 DIAGNOSIS — R160 Hepatomegaly, not elsewhere classified: Secondary | ICD-10-CM

## 2015-07-18 DIAGNOSIS — F411 Generalized anxiety disorder: Secondary | ICD-10-CM

## 2015-07-18 DIAGNOSIS — I779 Disorder of arteries and arterioles, unspecified: Secondary | ICD-10-CM | POA: Diagnosis not present

## 2015-07-18 DIAGNOSIS — D649 Anemia, unspecified: Secondary | ICD-10-CM

## 2015-07-18 DIAGNOSIS — Z23 Encounter for immunization: Secondary | ICD-10-CM

## 2015-07-18 DIAGNOSIS — Z1239 Encounter for other screening for malignant neoplasm of breast: Secondary | ICD-10-CM

## 2015-07-18 DIAGNOSIS — G479 Sleep disorder, unspecified: Secondary | ICD-10-CM

## 2015-07-18 DIAGNOSIS — E119 Type 2 diabetes mellitus without complications: Secondary | ICD-10-CM | POA: Diagnosis not present

## 2015-07-18 DIAGNOSIS — E7800 Pure hypercholesterolemia, unspecified: Secondary | ICD-10-CM

## 2015-07-18 MED ORDER — TRAZODONE HCL 50 MG PO TABS: 25.0000 mg | ORAL_TABLET | Freq: Every evening | ORAL | Status: DC | PRN

## 2015-07-18 NOTE — Progress Notes (Signed)
Patient ID: Jocelyn Elliott, female   DOB: 1932/08/30, 81 y.o.   MRN: [id number]   Subjective:    Patient ID: Jocelyn Elliott, female    DOB: 10/11/1932, 81 y.o.   MRN: [id number]  HPI  Patient here for a scheduled follow up.   She is still having problems sleeping.  Has been taking some xanax prn.  This helped to calm her down.  She feels she is doing better.  Just needs something to help her sleep.  Tries to stay active.  No cardiac symptoms with increased activity or exertion.  No sob.  Eating and drinking well.  Bowels stable.  Blood pressures and sugars reviewed.     Past Medical History  Diagnosis Date  . Arthritis   . Depression   . Diabetes mellitus   . Hypertension   . Hyperlipidemia   . Blood in stool   . CAD (coronary artery disease)   . Carotid arterial disease    Past Surgical History  Procedure Laterality Date  . Cholecystectomy  1993  . Appendectomy  1992  . Abdominal hysterectomy  1972    ovaries left in place  . Total hip arthroplasty  05/03/2011    left 12, right 11/07  . Back surgery  06/08/2010    L3, L4, L5  . Foot surgery  06/2007    Left  . Carpal tunnel release  1991, 92    right then left   . Carotid artery angioplasty  11/03  . Right carotid artery surgery  01/09/13    Dr. Delana Meyer @ [facility]   Family History  Problem Relation Age of Onset  . Arthritis Other     parent  . Hyperlipidemia Father   . Heart disease Father     myocardial infarction  . Hypertension Father     Parent  . Diabetes Other     nephew  . Cervical cancer Sister   . Breast cancer Neg Hx    Social History   Social History  . Marital Status: Widowed    Spouse Name: N/A  . Number of Children: 1  . Years of Education: 13   Occupational History  . Retired    Social History Main Topics  . Smoking status: Former Smoker    Quit date: 11/19/1989  . Smokeless tobacco: Never Used  . Alcohol Use: No  . Drug Use: No  . Sexual Activity: Not Asked   Other Topics Concern    . None   Social History Narrative   Regular exercise-no   Caffeine Use-yes    Outpatient Encounter Prescriptions as of 07/18/2015  Medication Sig  . ALPRAZolam (XANAX) 0.25 MG tablet TAKE 1 TABLET BY MOUTH EVERY DAY AS NEEDED FOR ANXIETY  . aspirin 81 MG tablet Take 81 mg by mouth daily.  Marland Kitchen atorvastatin (LIPITOR) 40 MG tablet TAKE 1 TABLET DAILY AND THEN ALTERNATE NEXT DAY WITH ONE-HALF (1/2) TABLET (NEED OFFICE VISIT FOR FURTHER REFILLS)  . cetirizine (ZYRTEC) 10 MG tablet Take 10 mg by mouth daily.  . hydrochlorothiazide (MICROZIDE) 12.5 MG capsule Take 1 capsule (12.5 mg total) by mouth daily.  . metoprolol succinate (TOPROL-XL) 100 MG 24 hr tablet TAKE 1 TABLET TWICE A DAY  . Omega-3 Fatty Acids (FISH OIL) 1200 MG CAPS Take 1 capsule by mouth daily.  Marland Kitchen omeprazole (PRILOSEC) 20 MG capsule Take 20 mg by mouth daily.  . ramipril (ALTACE) 10 MG capsule Take 1 capsule (10 mg total) by mouth daily.  . [  DISCONTINUED] metFORMIN (GLUCOPHAGE-XR) 500 MG 24 hr tablet TAKE 1 TABLET DAILY  . traZODone (DESYREL) 50 MG tablet Take 0.5-1 tablets (25-50 mg total) by mouth at bedtime as needed for sleep.  . [DISCONTINUED] oxyCODONE-acetaminophen (ROXICET) 5-325 MG per tablet Take 1 tablet by mouth every 6 (six) hours as needed for severe pain.   No facility-administered encounter medications on file as of 07/18/2015.    Review of Systems  Constitutional: Negative for activity change and unexpected weight change.  HENT: Negative for congestion and sinus pressure.   Eyes: Negative for discharge and visual disturbance.  Respiratory: Negative for cough, chest tightness and shortness of breath.   Cardiovascular: Negative for chest pain, palpitations and leg swelling.  Gastrointestinal: Negative for nausea, vomiting, abdominal pain and diarrhea.  Genitourinary: Negative for dysuria and difficulty urinating.  Skin: Negative for color change and rash.  Neurological: Negative for dizziness,  light-headedness and headaches.  Psychiatric/Behavioral: Positive for sleep disturbance. Negative for dysphoric mood and agitation.       Objective:    Physical Exam  Constitutional: She appears well-developed and well-nourished. No distress.  HENT:  Nose: Nose normal.  Mouth/Throat: Oropharynx is clear and moist.  Eyes: Conjunctivae are normal. Right eye exhibits no discharge. Left eye exhibits no discharge.  Neck: Neck supple. No thyromegaly present.  Cardiovascular: Normal rate and regular rhythm.   Pulmonary/Chest: Breath sounds normal. No respiratory distress. She has no wheezes.  Abdominal: Soft. Bowel sounds are normal. There is no tenderness.  Musculoskeletal: She exhibits no edema or tenderness.  Lymphadenopathy:    She has no cervical adenopathy.  Skin: No rash noted. No erythema.  Psychiatric: She has a normal mood and affect. Her behavior is normal.    BP 128/70 mmHg  Pulse 67  Temp(Src) 97.9 F (36.6 C) (Oral)  Ht 4' 10.5" (1.486 m)  Wt 141 lb 8 oz (64.184 kg)  BMI 29.07 kg/m2  SpO2 96% Wt Readings from Last 3 Encounters:  07/18/15 141 lb 8 oz (64.184 kg)  06/12/15 141 lb (63.957 kg)  05/02/15 142 lb 6 oz (64.581 kg)     Lab Results  Component Value Date   WBC 6.0 06/12/2015   HGB 12.7 06/12/2015   HCT 38.3 06/12/2015   PLT 189 06/12/2015   GLUCOSE 172* 06/12/2015   CHOL 173 05/18/2015   TRIG 198.0* 05/18/2015   HDL 42.00 05/18/2015   LDLDIRECT 107.7 10/27/2013   LDLCALC 91 05/18/2015   ALT 29 06/22/2015   AST 34 06/22/2015   NA 137 06/12/2015   K 4.6 06/12/2015   CL 102 06/12/2015   CREATININE 0.98 06/12/2015   BUN 12 06/12/2015   CO2 26 06/12/2015   TSH 2.39 05/18/2015   INR 1.0 01/10/2013   HGBA1C 8.0* 05/18/2015   MICROALBUR <0.7 05/18/2015    Ct Renal Stone Study  06/12/2015   CLINICAL DATA:  Right-sided flank pain.  EXAM: CT ABDOMEN AND PELVIS WITHOUT CONTRAST  TECHNIQUE: Multidetector CT imaging of the abdomen and pelvis was  performed following the standard protocol without IV contrast.  COMPARISON:  05/19/2009  FINDINGS: There is no evidence of urinary tract calculi or hydronephrosis. The bladder is decompressed. The liver shows increased steatosis since the prior study as well as overall more prominent enlargement. No overt evidence of cirrhosis. No focal lesions are identified by unenhanced CT.  The gallbladder has been removed. No biliary ductal dilatation is seen. The unenhanced appearance of the pancreas, spleen, adrenal glands and bowel are unremarkable. No abnormal  fluid collections identified. No masses or enlarged lymph nodes are seen. Diffuse calcified plaque is again noted throughout the abdominal aorta and iliac arteries without evidence of aneurysm. No hernias are identified. Artifact in the pelvis again demonstrated due to bilateral hip  Interval left hip arthroplasty and lumbar fusion at the L4-5 and L5-S1 levels. Gross is stable appearance of right hip arthroplasty. Visualized lung bases are unremarkable.  IMPRESSION: 1. No acute findings. No evidence of urinary tract calculi or hydronephrosis. 2. Progressive hepatic steatosis and hepatomegaly since the prior CT in 2010 without overt cirrhotic changes by unenhanced CT.   Electronically Signed   By: Aletta Edouard M.D.   On: 06/12/2015 12:43       Assessment & Plan:   Problem List Items Addressed This Visit    Anemia    Followed by hematology.       CAD (coronary artery disease)    Continue risk factor modification.  Desires no further cardiac testing.  Follow.        Carotid arterial disease    Sees Dr Delana Meyer.  Continue aspirin.  He follows with serial ultrasounds.        Diabetes mellitus    Low carb diet and exercise.  Follow met b and a1c.  Attached sugars reviewed.  Saw Dr Thomasene Ripple 04/2015.        Relevant Orders   Hemoglobin A1c   Generalized anxiety disorder    Xanax has been working well for her.  Takes prn.  Not sleeping.  Trial of  trazodone.  Follow.       Hepatomegaly    CT reviewed.  Question of need for further w/up.  Will d/w pt if desires further w/up.  Follow.  Check liver panel with next labs.  Recent liver panel wnl.        Hypercholesteremia    On lipitor.  Low cholesterol diet and exercise.  Follow lipid panel and liver function tests.       Relevant Orders   Lipid panel   Hepatic function panel   Hypertension    Blood pressure overall controlled as outlined.  Same medication regimen.  Follow pressures.  Follow metabolic panel.       Relevant Orders   Basic metabolic panel   Sleeping difficulty    Trial of trazodone.  Follow.        Other Visit Diagnoses    Screening breast examination        Relevant Orders    MM DIGITAL SCREENING BILATERAL    Encounter for immunization            Einar Pheasant, MD

## 2015-07-18 NOTE — Progress Notes (Signed)
Pre-visit discussion using our clinic review tool. No additional management support is needed unless otherwise documented below in the visit note.  

## 2015-07-22 ENCOUNTER — Other Ambulatory Visit: Payer: Self-pay | Admitting: Internal Medicine

## 2015-07-24 ENCOUNTER — Encounter: Payer: Self-pay | Admitting: Internal Medicine

## 2015-07-24 DIAGNOSIS — G479 Sleep disorder, unspecified: Secondary | ICD-10-CM | POA: Insufficient documentation

## 2015-07-24 DIAGNOSIS — R160 Hepatomegaly, not elsewhere classified: Secondary | ICD-10-CM | POA: Insufficient documentation

## 2015-07-24 NOTE — Assessment & Plan Note (Signed)
Blood pressure overall controlled as outlined.  Same medication regimen.  Follow pressures.  Follow metabolic panel.

## 2015-07-24 NOTE — Assessment & Plan Note (Signed)
Sees Dr Delana Meyer.  Continue aspirin.  He follows with serial ultrasounds.

## 2015-07-24 NOTE — Assessment & Plan Note (Addendum)
CT reviewed.  Question of need for further w/up.  Will d/w pt if desires further w/up.  Follow.  Check liver panel with next labs.  Recent liver panel wnl.

## 2015-07-24 NOTE — Assessment & Plan Note (Signed)
Low carb diet and exercise.  Follow met b and a1c.  Attached sugars reviewed.  Saw Dr Thomasene Ripple 04/2015.

## 2015-07-24 NOTE — Assessment & Plan Note (Signed)
On lipitor.  Low cholesterol diet and exercise.  Follow lipid panel and liver function tests.   

## 2015-07-24 NOTE — Assessment & Plan Note (Signed)
Trial of trazodone.  Follow.

## 2015-07-24 NOTE — Assessment & Plan Note (Signed)
Xanax has been working well for her.  Takes prn.  Not sleeping.  Trial of trazodone.  Follow.

## 2015-07-24 NOTE — Assessment & Plan Note (Signed)
Followed by hematology 

## 2015-07-24 NOTE — Assessment & Plan Note (Signed)
Continue risk factor modification.  Desires no further cardiac testing.  Follow.

## 2015-09-13 ENCOUNTER — Inpatient Hospital Stay: Payer: Medicare Other | Attending: Internal Medicine | Admitting: *Deleted

## 2015-09-13 DIAGNOSIS — D509 Iron deficiency anemia, unspecified: Secondary | ICD-10-CM | POA: Diagnosis not present

## 2015-09-13 DIAGNOSIS — Z79899 Other long term (current) drug therapy: Secondary | ICD-10-CM | POA: Insufficient documentation

## 2015-09-13 LAB — CBC WITH DIFFERENTIAL/PLATELET
BASOS PCT: 1 %
Basophils Absolute: 0.1 10*3/uL (ref 0–0.1)
Eosinophils Absolute: 0.2 10*3/uL (ref 0–0.7)
Eosinophils Relative: 3 %
HEMATOCRIT: 33 % — AB (ref 35.0–47.0)
Hemoglobin: 10.7 g/dL — ABNORMAL LOW (ref 12.0–16.0)
LYMPHS ABS: 1.9 10*3/uL (ref 1.0–3.6)
Lymphocytes Relative: 29 %
MCH: 27.1 pg (ref 26.0–34.0)
MCHC: 32.4 g/dL (ref 32.0–36.0)
MCV: 83.6 fL (ref 80.0–100.0)
MONO ABS: 0.4 10*3/uL (ref 0.2–0.9)
Monocytes Relative: 6 %
NEUTROS ABS: 3.9 10*3/uL (ref 1.4–6.5)
Neutrophils Relative %: 61 %
Platelets: 219 10*3/uL (ref 150–440)
RBC: 3.95 MIL/uL (ref 3.80–5.20)
RDW: 14.7 % — ABNORMAL HIGH (ref 11.5–14.5)
WBC: 6.4 10*3/uL (ref 3.6–11.0)

## 2015-09-13 LAB — IRON AND TIBC
Iron: 33 ug/dL (ref 28–170)
SATURATION RATIOS: 8 % — AB (ref 10.4–31.8)
TIBC: 433 ug/dL (ref 250–450)
UIBC: 400 ug/dL

## 2015-09-13 LAB — FERRITIN: Ferritin: 12 ng/mL (ref 11–307)

## 2015-09-15 ENCOUNTER — Other Ambulatory Visit (INDEPENDENT_AMBULATORY_CARE_PROVIDER_SITE_OTHER): Payer: Medicare Other

## 2015-09-15 DIAGNOSIS — E7800 Pure hypercholesterolemia, unspecified: Secondary | ICD-10-CM

## 2015-09-15 DIAGNOSIS — I10 Essential (primary) hypertension: Secondary | ICD-10-CM

## 2015-09-15 DIAGNOSIS — E119 Type 2 diabetes mellitus without complications: Secondary | ICD-10-CM

## 2015-09-15 LAB — BASIC METABOLIC PANEL WITH GFR
BUN: 16 mg/dL (ref 6–23)
CO2: 32 meq/L (ref 19–32)
Calcium: 9.7 mg/dL (ref 8.4–10.5)
Chloride: 101 meq/L (ref 96–112)
Creatinine, Ser: 1.03 mg/dL (ref 0.40–1.20)
GFR: 54.63 mL/min — ABNORMAL LOW
Glucose, Bld: 165 mg/dL — ABNORMAL HIGH (ref 70–99)
Potassium: 4.1 meq/L (ref 3.5–5.1)
Sodium: 140 meq/L (ref 135–145)

## 2015-09-15 LAB — LIPID PANEL
Cholesterol: 161 mg/dL (ref 0–200)
HDL: 41.2 mg/dL (ref >39.00)
LDL Cholesterol: 85 mg/dL (ref 0–99)
NONHDL: 120.24
TRIGLYCERIDES: 177 mg/dL — AB (ref 0.0–149.0)
Total CHOL/HDL Ratio: 4
VLDL: 35.4 mg/dL (ref 0.0–40.0)

## 2015-09-15 LAB — HEPATIC FUNCTION PANEL
ALT: 25 U/L (ref 0–35)
AST: 22 U/L (ref 0–37)
Albumin: 3.9 g/dL (ref 3.5–5.2)
Alkaline Phosphatase: 62 U/L (ref 39–117)
Bilirubin, Direct: 0.1 mg/dL (ref 0.0–0.3)
Total Bilirubin: 0.5 mg/dL (ref 0.2–1.2)
Total Protein: 6.8 g/dL (ref 6.0–8.3)

## 2015-09-15 LAB — HEMOGLOBIN A1C: Hgb A1c MFr Bld: 8 % — ABNORMAL HIGH (ref 4.6–6.5)

## 2015-09-20 ENCOUNTER — Encounter: Payer: Self-pay | Admitting: Internal Medicine

## 2015-09-20 ENCOUNTER — Ambulatory Visit (INDEPENDENT_AMBULATORY_CARE_PROVIDER_SITE_OTHER): Payer: Medicare Other | Admitting: Internal Medicine

## 2015-09-20 VITALS — BP 130/70 | HR 81 | Temp 97.8°F | Resp 17 | Ht <= 58 in | Wt 144.0 lb

## 2015-09-20 DIAGNOSIS — F411 Generalized anxiety disorder: Secondary | ICD-10-CM

## 2015-09-20 DIAGNOSIS — I739 Peripheral vascular disease, unspecified: Secondary | ICD-10-CM

## 2015-09-20 DIAGNOSIS — M25561 Pain in right knee: Secondary | ICD-10-CM

## 2015-09-20 DIAGNOSIS — E119 Type 2 diabetes mellitus without complications: Secondary | ICD-10-CM

## 2015-09-20 DIAGNOSIS — I779 Disorder of arteries and arterioles, unspecified: Secondary | ICD-10-CM | POA: Diagnosis not present

## 2015-09-20 DIAGNOSIS — I10 Essential (primary) hypertension: Secondary | ICD-10-CM

## 2015-09-20 DIAGNOSIS — G479 Sleep disorder, unspecified: Secondary | ICD-10-CM

## 2015-09-20 DIAGNOSIS — M25562 Pain in left knee: Secondary | ICD-10-CM

## 2015-09-20 DIAGNOSIS — R0602 Shortness of breath: Secondary | ICD-10-CM | POA: Diagnosis not present

## 2015-09-20 DIAGNOSIS — D649 Anemia, unspecified: Secondary | ICD-10-CM

## 2015-09-20 DIAGNOSIS — I2510 Atherosclerotic heart disease of native coronary artery without angina pectoris: Secondary | ICD-10-CM | POA: Diagnosis not present

## 2015-09-20 DIAGNOSIS — E7800 Pure hypercholesterolemia, unspecified: Secondary | ICD-10-CM

## 2015-09-20 MED ORDER — METFORMIN HCL ER 500 MG PO TB24: 500.0000 mg | ORAL_TABLET | Freq: Two times a day (BID) | ORAL | Status: DC

## 2015-09-20 MED ORDER — TRAZODONE HCL 50 MG PO TABS: 25.0000 mg | ORAL_TABLET | Freq: Every evening | ORAL | Status: DC | PRN

## 2015-09-20 NOTE — Progress Notes (Signed)
Pre-visit discussion using our clinic review tool. No additional management support is needed unless otherwise documented below in the visit note.  

## 2015-09-20 NOTE — Progress Notes (Signed)
Patient ID: Jocelyn Elliott, female   DOB: 02/06/1932, 81 y.o.   MRN: [id number]   Subjective:    Patient ID: Jocelyn Elliott, female    DOB: 06/02/1932, 81 y.o.   MRN: [id number]  HPI  Patient with past history of depression, diabetes, CAD, hypertension and hypercholesterolemia.  She comes in today to follow up on these issues as well as for a complete physical exam.  She is under increased stress.  Her sister's boyfriend just passed away last night.  She feels she is handling things well.  We discussed her sugars.  Reviewed her list of sugar readings.  We discussed diet and exercise. She saw hematology last week.  States her hgb was down to 10.7 (13.2 in 04/2015).  Some sob with exertion.  She relates to decreased hgb.  She request f/u with Dr Saralyn Pilar.  Desires not to have EKG today.   No increased cough and congestion.  No acid reflux.  No abdominal pain or cramping.  No blood in her stool.  Increased pain in her posterior knees and down leg.  Request f/u with Dr Marry Guan.     Past Medical History  Diagnosis Date  . Arthritis   . Depression   . Diabetes mellitus ([place])   . Hypertension   . Hyperlipidemia   . Blood in stool   . CAD (coronary artery disease)   . Carotid arterial disease [facility])    Past Surgical History  Procedure Laterality Date  . Cholecystectomy  1993  . Appendectomy  1992  . Abdominal hysterectomy  1972    ovaries left in place  . Total hip arthroplasty  05/03/2011    left 12, right 11/07  . Back surgery  06/08/2010    L3, L4, L5  . Foot surgery  06/2007    Left  . Carpal tunnel release  1991, 92    right then left   . Carotid artery angioplasty  11/03  . Right carotid artery surgery  01/09/13    Dr. Delana Meyer @ [facility]   Family History  Problem Relation Age of Onset  . Arthritis Other     parent  . Hyperlipidemia Father   . Heart disease Father     myocardial infarction  . Hypertension Father     Parent  . Diabetes Other     nephew  . Cervical cancer  Sister   . Breast cancer Neg Hx    Social History   Social History  . Marital Status: Widowed    Spouse Name: N/A  . Number of Children: 1  . Years of Education: 13   Occupational History  . Retired    Social History Main Topics  . Smoking status: Former Smoker    Quit date: 11/19/1989  . Smokeless tobacco: Never Used  . Alcohol Use: No  . Drug Use: No  . Sexual Activity: Not Asked   Other Topics Concern  . None   Social History Narrative   Regular exercise-no   Caffeine Use-yes    Outpatient Encounter Prescriptions as of 09/20/2015  Medication Sig  . aspirin 81 MG tablet Take 81 mg by mouth daily.  Marland Kitchen atorvastatin (LIPITOR) 40 MG tablet TAKE 1 TABLET DAILY AND THEN ALTERNATE NEXT DAY WITH ONE-HALF (1/2) TABLET (NEED OFFICE VISIT FOR FURTHER REFILLS)  . cetirizine (ZYRTEC) 10 MG tablet Take 10 mg by mouth daily.  . hydrochlorothiazide (MICROZIDE) 12.5 MG capsule Take 1 capsule (12.5 mg total) by mouth daily.  . metFORMIN (GLUCOPHAGE-XR)  500 MG 24 hr tablet Take 1 tablet (500 mg total) by mouth 2 (two) times daily.  . metoprolol succinate (TOPROL-XL) 100 MG 24 hr tablet TAKE 1 TABLET TWICE A DAY  . Omega-3 Fatty Acids (FISH OIL) 1200 MG CAPS Take 1 capsule by mouth daily.  Marland Kitchen omeprazole (PRILOSEC) 20 MG capsule Take 20 mg by mouth daily.  . ramipril (ALTACE) 10 MG capsule Take 1 capsule (10 mg total) by mouth daily.  . traZODone (DESYREL) 50 MG tablet Take 0.5-1 tablets (25-50 mg total) by mouth at bedtime as needed for sleep.  . [DISCONTINUED] metFORMIN (GLUCOPHAGE-XR) 500 MG 24 hr tablet TAKE 1 TABLET DAILY  . [DISCONTINUED] traZODone (DESYREL) 50 MG tablet Take 0.5-1 tablets (25-50 mg total) by mouth at bedtime as needed for sleep.  . [DISCONTINUED] ALPRAZolam (XANAX) 0.25 MG tablet TAKE 1 TABLET BY MOUTH EVERY DAY AS NEEDED FOR ANXIETY   No facility-administered encounter medications on file as of 09/20/2015.    Review of Systems  Constitutional: Negative for appetite  change and unexpected weight change.  HENT: Negative for congestion and sinus pressure.   Eyes: Negative for pain and visual disturbance.  Respiratory: Negative for cough and chest tightness.        SOB with exertion.    Cardiovascular: Negative for chest pain, palpitations and leg swelling.  Gastrointestinal: Negative for nausea, vomiting, abdominal pain and diarrhea.  Genitourinary: Negative for dysuria and difficulty urinating.  Musculoskeletal: Negative for back pain and joint swelling.  Skin: Negative for color change and rash.  Neurological: Negative for dizziness, light-headedness and headaches.  Hematological: Negative for adenopathy. Does not bruise/bleed easily.  Psychiatric/Behavioral: Negative for dysphoric mood and agitation.       Objective:     Blood pressure rechecked by me:  120/68  Physical Exam  Constitutional: She is oriented to person, place, and time. She appears well-developed and well-nourished. No distress.  HENT:  Nose: Nose normal.  Mouth/Throat: Oropharynx is clear and moist.  Eyes: Right eye exhibits no discharge. Left eye exhibits no discharge. No scleral icterus.  Neck: Neck supple. No thyromegaly present.  Cardiovascular: Normal rate and regular rhythm.   Pulmonary/Chest: Breath sounds normal. No accessory muscle usage. No tachypnea. No respiratory distress. She has no decreased breath sounds. She has no wheezes. She has no rhonchi. Right breast exhibits no inverted nipple, no mass, no nipple discharge and no tenderness (no axillary adenopathy). Left breast exhibits no inverted nipple, no mass, no nipple discharge and no tenderness (no axilarry adenopathy).  Abdominal: Soft. Bowel sounds are normal. There is no tenderness.  Musculoskeletal: She exhibits no edema or tenderness.  Lymphadenopathy:    She has no cervical adenopathy.  Neurological: She is alert and oriented to person, place, and time.  Skin: Skin is warm. No rash noted. No erythema.    Psychiatric: She has a normal mood and affect. Her behavior is normal.    BP 130/70 mmHg  Pulse 81  Temp(Src) 97.8 F (36.6 C) (Oral)  Resp 17  Ht 4' 10"  (1.473 m)  Wt 144 lb (65.318 kg)  BMI 30.10 kg/m2  SpO2 94% Wt Readings from Last 3 Encounters:  09/20/15 144 lb (65.318 kg)  07/18/15 141 lb 8 oz (64.184 kg)  06/12/15 141 lb (63.957 kg)     Lab Results  Component Value Date   WBC 6.4 09/13/2015   HGB 10.7* 09/13/2015   HCT 33.0* 09/13/2015   PLT 219 09/13/2015   GLUCOSE 165* 09/15/2015   CHOL  161 09/15/2015   TRIG 177.0* 09/15/2015   HDL 41.20 09/15/2015   LDLDIRECT 107.7 10/27/2013   LDLCALC 85 09/15/2015   ALT 25 09/15/2015   AST 22 09/15/2015   NA 140 09/15/2015   K 4.1 09/15/2015   CL 101 09/15/2015   CREATININE 1.03 09/15/2015   BUN 16 09/15/2015   CO2 32 09/15/2015   TSH 2.39 05/18/2015   INR 1.0 01/10/2013   HGBA1C 8.0* 09/15/2015   MICROALBUR <0.7 05/18/2015    Ct Renal Stone Study  06/12/2015  CLINICAL DATA:  Right-sided flank pain. EXAM: CT ABDOMEN AND PELVIS WITHOUT CONTRAST TECHNIQUE: Multidetector CT imaging of the abdomen and pelvis was performed following the standard protocol without IV contrast. COMPARISON:  05/19/2009 FINDINGS: There is no evidence of urinary tract calculi or hydronephrosis. The bladder is decompressed. The liver shows increased steatosis since the prior study as well as overall more prominent enlargement. No overt evidence of cirrhosis. No focal lesions are identified by unenhanced CT. The gallbladder has been removed. No biliary ductal dilatation is seen. The unenhanced appearance of the pancreas, spleen, adrenal glands and bowel are unremarkable. No abnormal fluid collections identified. No masses or enlarged lymph nodes are seen. Diffuse calcified plaque is again noted throughout the abdominal aorta and iliac arteries without evidence of aneurysm. No hernias are identified. Artifact in the pelvis again demonstrated due to  bilateral hip Interval left hip arthroplasty and lumbar fusion at the L4-5 and L5-S1 levels. Gross is stable appearance of right hip arthroplasty. Visualized lung bases are unremarkable. IMPRESSION: 1. No acute findings. No evidence of urinary tract calculi or hydronephrosis. 2. Progressive hepatic steatosis and hepatomegaly since the prior CT in 2010 without overt cirrhotic changes by unenhanced CT. Electronically Signed   By: Aletta Edouard M.D.   On: 06/12/2015 12:43       Assessment & Plan:   Problem List Items Addressed This Visit    Anemia    hgb decreased recently as outlined.  Followed by hematology.  She plans to f/u with them soon for recheck.       CAD (coronary artery disease)    Continue risk factor modification.  Does have the sob with exertion.  Just had hgb checked last week.  10.6.  May be contributing.  Offered EKG.  She declined.  Requested f/u with Dr Saralyn Pilar.  Order placed for referral.  She has declined further w/up previously.        Relevant Orders   Ambulatory referral to Cardiology   Carotid arterial disease ([place])    Followed by vascular surgery.  States recently evaluated.  Recommended f/u in one year.        Diabetes mellitus ([place])    Sugars reviewed and attached.  Low carb diet and exercise.  Follow met b and a1c.  Some elevation.  Will increase metformin to bid.  Follow.        Relevant Medications   metFORMIN (GLUCOPHAGE-XR) 500 MG 24 hr tablet   Other Relevant Orders   Hemoglobin A1c   Generalized anxiety disorder    Takes xanax prn.  On trazodone to help her sleep.  Working well.  Follow.        Hypercholesteremia    On lipitor.  Low cholesterol diet and exercise.  Follow lipid panel and liver function tests.   Lab Results  Component Value Date   CHOL 161 09/15/2015   HDL 41.20 09/15/2015   LDLCALC 85 09/15/2015   LDLDIRECT 107.7 10/27/2013  TRIG 177.0* 09/15/2015   CHOLHDL 4 09/15/2015        Relevant Orders   Hepatic function panel    Lipid panel   Hypertension    Blood pressure as outlined.  Follow pressures.  Follow metabolic panel.        Relevant Orders   Basic metabolic panel   Knee pain, bilateral    Pain in posterior knees and down legs as outlined.  Request referral to Dr Marry Guan.  Order placed for referral.       Relevant Orders   Ambulatory referral to Orthopedic Surgery   Sleeping difficulty    On trazodone and doing well.  Follow.        SOB (shortness of breath) on exertion - Primary    May be related to the decreased hgb.  Plans to f/up with hematology.  Refer to cardiology as outlined.  She declines EKG today.       Relevant Orders   Ambulatory referral to Cardiology       Einar Pheasant, MD

## 2015-09-22 DIAGNOSIS — I2510 Atherosclerotic heart disease of native coronary artery without angina pectoris: Secondary | ICD-10-CM | POA: Insufficient documentation

## 2015-09-23 ENCOUNTER — Other Ambulatory Visit: Payer: Self-pay | Admitting: Internal Medicine

## 2015-09-25 ENCOUNTER — Other Ambulatory Visit: Payer: Self-pay | Admitting: *Deleted

## 2015-09-25 ENCOUNTER — Encounter: Payer: Self-pay | Admitting: Internal Medicine

## 2015-09-25 DIAGNOSIS — D649 Anemia, unspecified: Secondary | ICD-10-CM

## 2015-09-25 NOTE — Assessment & Plan Note (Signed)
hgb decreased recently as outlined.  Followed by hematology.  She plans to f/u with them soon for recheck.

## 2015-09-25 NOTE — Assessment & Plan Note (Signed)
Sugars reviewed and attached.  Low carb diet and exercise.  Follow met b and a1c.  Some elevation.  Will increase metformin to bid.  Follow.

## 2015-09-25 NOTE — Assessment & Plan Note (Signed)
On trazodone and doing well.  Follow.

## 2015-09-25 NOTE — Assessment & Plan Note (Signed)
On lipitor.  Low cholesterol diet and exercise.  Follow lipid panel and liver function tests.   Lab Results  Component Value Date   CHOL 161 09/15/2015   HDL 41.20 09/15/2015   LDLCALC 85 09/15/2015   LDLDIRECT 107.7 10/27/2013   TRIG 177.0* 09/15/2015   CHOLHDL 4 09/15/2015

## 2015-09-25 NOTE — Assessment & Plan Note (Signed)
Blood pressure as outlined.  Follow pressures.  Follow metabolic panel.   

## 2015-09-25 NOTE — Assessment & Plan Note (Signed)
Takes xanax prn.  On trazodone to help her sleep.  Working well.  Follow.

## 2015-09-25 NOTE — Assessment & Plan Note (Signed)
Followed by vascular surgery.  States recently evaluated.  Recommended f/u in one year.

## 2015-09-25 NOTE — Assessment & Plan Note (Signed)
Continue risk factor modification.  Does have the sob with exertion.  Just had hgb checked last week.  10.6.  May be contributing.  Offered EKG.  She declined.  Requested f/u with Dr Saralyn Pilar.  Order placed for referral.  She has declined further w/up previously.

## 2015-09-25 NOTE — Assessment & Plan Note (Signed)
May be related to the decreased hgb.  Plans to f/up with hematology.  Refer to cardiology as outlined.  She declines EKG today.

## 2015-09-25 NOTE — Assessment & Plan Note (Signed)
Pain in posterior knees and down legs as outlined.  Request referral to Dr Marry Guan.  Order placed for referral.

## 2015-10-09 ENCOUNTER — Other Ambulatory Visit
Admission: RE | Admit: 2015-10-09 | Discharge: 2015-10-09 | Disposition: A | Discharge status: Home / Self Care | Payer: Medicare Other | Source: Ambulatory Visit | Attending: Nurse Practitioner | Admitting: Nurse Practitioner

## 2015-10-09 DIAGNOSIS — R197 Diarrhea, unspecified: Secondary | ICD-10-CM | POA: Insufficient documentation

## 2015-10-09 LAB — C DIFFICILE QUICK SCREEN W PCR REFLEX
C Diff antigen: NEGATIVE
C Diff interpretation: NEGATIVE
C Diff toxin: NEGATIVE

## 2015-10-11 ENCOUNTER — Inpatient Hospital Stay: Payer: Medicare Other | Attending: Internal Medicine

## 2015-10-11 VITALS — BP 100/61 | HR 77 | Temp 98.2°F

## 2015-10-11 DIAGNOSIS — D508 Other iron deficiency anemias: Secondary | ICD-10-CM

## 2015-10-11 DIAGNOSIS — Z79899 Other long term (current) drug therapy: Secondary | ICD-10-CM | POA: Diagnosis not present

## 2015-10-11 DIAGNOSIS — D509 Iron deficiency anemia, unspecified: Secondary | ICD-10-CM | POA: Insufficient documentation

## 2015-10-11 LAB — GIARDIA, EIA; OVA/PARASITE: Giardia Ag, Stl: NEGATIVE

## 2015-10-11 LAB — O&P RESULT

## 2015-10-11 MED ORDER — SODIUM CHLORIDE 0.9 % IV SOLN
510.0000 mg | Freq: Once | INTRAVENOUS | Status: AC
  Administered 2015-10-11: 510 mg via INTRAVENOUS
  Filled 2015-10-11: qty 17

## 2015-10-11 MED ORDER — SODIUM CHLORIDE 0.9 % IV SOLN
Freq: Once | INTRAVENOUS | Status: AC
  Administered 2015-10-11: 14:00:00 via INTRAVENOUS
  Filled 2015-10-11: qty 1000

## 2015-10-12 LAB — STOOL CULTURE

## 2015-10-18 ENCOUNTER — Inpatient Hospital Stay: Payer: Medicare Other

## 2015-10-18 VITALS — BP 143/74 | HR 63 | Temp 98.0°F

## 2015-10-18 DIAGNOSIS — D508 Other iron deficiency anemias: Secondary | ICD-10-CM

## 2015-10-18 DIAGNOSIS — D509 Iron deficiency anemia, unspecified: Secondary | ICD-10-CM | POA: Diagnosis not present

## 2015-10-18 MED ORDER — SODIUM CHLORIDE 0.9 % IV SOLN
510.0000 mg | Freq: Once | INTRAVENOUS | Status: AC
  Administered 2015-10-18: 510 mg via INTRAVENOUS
  Filled 2015-10-18: qty 17

## 2015-10-18 MED ORDER — SODIUM CHLORIDE 0.9 % IV SOLN
Freq: Once | INTRAVENOUS | Status: AC
Start: 2015-10-18 — End: 2015-10-18
  Administered 2015-10-18: 14:00:00 via INTRAVENOUS
  Filled 2015-10-18: qty 1000

## 2015-10-27 ENCOUNTER — Ambulatory Visit
Admission: RE | Admit: 2015-10-27 | Discharge: 2015-10-27 | Disposition: A | Discharge status: Home / Self Care | Payer: Medicare Other | Source: Ambulatory Visit | Attending: Internal Medicine | Admitting: Internal Medicine

## 2015-10-27 ENCOUNTER — Other Ambulatory Visit: Payer: Self-pay | Admitting: Internal Medicine

## 2015-10-27 DIAGNOSIS — Z1231 Encounter for screening mammogram for malignant neoplasm of breast: Secondary | ICD-10-CM | POA: Diagnosis present

## 2015-10-27 DIAGNOSIS — Z1239 Encounter for other screening for malignant neoplasm of breast: Secondary | ICD-10-CM

## 2015-10-28 ENCOUNTER — Encounter: Payer: Self-pay | Admitting: Internal Medicine

## 2015-10-28 DIAGNOSIS — Z0000 Encounter for general adult medical examination without abnormal findings: Secondary | ICD-10-CM | POA: Insufficient documentation

## 2015-10-28 LAB — HM DIABETES EYE EXAM

## 2015-11-02 ENCOUNTER — Encounter: Payer: Self-pay | Admitting: Internal Medicine

## 2015-11-24 ENCOUNTER — Other Ambulatory Visit: Payer: Self-pay | Admitting: Internal Medicine

## 2015-12-21 ENCOUNTER — Emergency Department
Admission: EM | Admit: 2015-12-21 | Discharge: 2015-12-21 | Disposition: A | Discharge status: Home / Self Care | Payer: Medicare Other | Attending: Emergency Medicine | Admitting: Emergency Medicine

## 2015-12-21 ENCOUNTER — Emergency Department: Payer: Medicare Other

## 2015-12-21 ENCOUNTER — Encounter: Payer: Self-pay | Admitting: Emergency Medicine

## 2015-12-21 DIAGNOSIS — I10 Essential (primary) hypertension: Secondary | ICD-10-CM | POA: Diagnosis not present

## 2015-12-21 DIAGNOSIS — Z87891 Personal history of nicotine dependence: Secondary | ICD-10-CM | POA: Insufficient documentation

## 2015-12-21 DIAGNOSIS — R197 Diarrhea, unspecified: Secondary | ICD-10-CM | POA: Diagnosis not present

## 2015-12-21 DIAGNOSIS — E119 Type 2 diabetes mellitus without complications: Secondary | ICD-10-CM | POA: Diagnosis not present

## 2015-12-21 LAB — CBC
HEMATOCRIT: 40.1 % (ref 35.0–47.0)
HEMOGLOBIN: 13.5 g/dL (ref 12.0–16.0)
MCH: 30.2 pg (ref 26.0–34.0)
MCHC: 33.8 g/dL (ref 32.0–36.0)
MCV: 89.4 fL (ref 80.0–100.0)
Platelets: 162 10*3/uL (ref 150–440)
RBC: 4.49 MIL/uL (ref 3.80–5.20)
RDW: 18.9 % — AB (ref 11.5–14.5)
WBC: 7.2 10*3/uL (ref 3.6–11.0)

## 2015-12-21 LAB — BASIC METABOLIC PANEL
Anion gap: 10 (ref 5–15)
BUN: 14 mg/dL (ref 6–20)
CHLORIDE: 104 mmol/L (ref 101–111)
CO2: 26 mmol/L (ref 22–32)
Calcium: 9.4 mg/dL (ref 8.9–10.3)
Creatinine, Ser: 0.9 mg/dL (ref 0.44–1.00)
GFR, EST NON AFRICAN AMERICAN: 58 mL/min — AB (ref >60)
Glucose, Bld: 149 mg/dL — ABNORMAL HIGH (ref 65–99)
POTASSIUM: 3.8 mmol/L (ref 3.5–5.1)
SODIUM: 140 mmol/L (ref 135–145)

## 2015-12-21 LAB — TROPONIN I

## 2015-12-21 MED ORDER — DIPHENOXYLATE-ATROPINE 2.5-0.025 MG PO TABS: 1.0000 | ORAL_TABLET | Freq: Four times a day (QID) | ORAL | Status: DC | PRN

## 2015-12-21 MED ORDER — HYDROCHLOROTHIAZIDE 25 MG PO TABS: 25.0000 mg | ORAL_TABLET | Freq: Every day | ORAL | Status: DC

## 2015-12-21 NOTE — Discharge Instructions (Signed)
Hypertension Hypertension, commonly called high blood pressure, is when the force of blood pumping through your arteries is too strong. Your arteries are the blood vessels that carry blood from your heart throughout your body. A blood pressure reading consists of a higher number over a lower number, such as 110/72. The higher number (systolic) is the pressure inside your arteries when your heart pumps. The lower number (diastolic) is the pressure inside your arteries when your heart relaxes. Ideally you want your blood pressure below 120/80. Hypertension forces your heart to work harder to pump blood. Your arteries may become narrow or stiff. Having untreated or uncontrolled hypertension can cause heart attack, stroke, kidney disease, and other problems. RISK FACTORS Some risk factors for high blood pressure are controllable. Others are not.  Risk factors you cannot control include:   Race. You may be at higher risk if you are African American.  Age. Risk increases with age.  Gender. Men are at higher risk than women before age 45 years. After age 65, women are at higher risk than men. Risk factors you can control include:  Not getting enough exercise or physical activity.  Being overweight.  Getting too much fat, sugar, calories, or salt in your diet.  Drinking too much alcohol. SIGNS AND SYMPTOMS Hypertension does not usually cause signs or symptoms. Extremely high blood pressure (hypertensive crisis) may cause headache, anxiety, shortness of breath, and nosebleed. DIAGNOSIS To check if you have hypertension, your health care provider will measure your blood pressure while you are seated, with your arm held at the level of your heart. It should be measured at least twice using the same arm. Certain conditions can cause a difference in blood pressure between your right and left arms. A blood pressure reading that is higher than normal on one occasion does not mean that you need treatment. If  it is not clear whether you have high blood pressure, you may be asked to return on a different day to have your blood pressure checked again. Or, you may be asked to monitor your blood pressure at home for 1 or more weeks. TREATMENT Treating high blood pressure includes making lifestyle changes and possibly taking medicine. Living a healthy lifestyle can help lower high blood pressure. You may need to change some of your habits. Lifestyle changes may include:  Following the DASH diet. This diet is high in fruits, vegetables, and whole grains. It is low in salt, red meat, and added sugars.  Keep your sodium intake below 2,300 mg per day.  Getting at least 30-45 minutes of aerobic exercise at least 4 times per week.  Losing weight if necessary.  Not smoking.  Limiting alcoholic beverages.  Learning ways to reduce stress. Your health care provider may prescribe medicine if lifestyle changes are not enough to get your blood pressure under control, and if one of the following is true:  You are 18-59 years of age and your systolic blood pressure is above 140.  You are 60 years of age or older, and your systolic blood pressure is above 150.  Your diastolic blood pressure is above 90.  You have diabetes, and your systolic blood pressure is over 140 or your diastolic blood pressure is over 90.  You have kidney disease and your blood pressure is above 140/90.  You have heart disease and your blood pressure is above 140/90. Your personal target blood pressure may vary depending on your medical conditions, your age, and other factors. HOME CARE INSTRUCTIONS    Have your blood pressure rechecked as directed by your health care provider.   Take medicines only as directed by your health care provider. Follow the directions carefully. Blood pressure medicines must be taken as prescribed. The medicine does not work as well when you skip doses. Skipping doses also puts you at risk for  problems.  Do not smoke.   Monitor your blood pressure at home as directed by your health care provider. SEEK MEDICAL CARE IF:   You think you are having a reaction to medicines taken.  You have recurrent headaches or feel dizzy.  You have swelling in your ankles.  You have trouble with your vision. SEEK IMMEDIATE MEDICAL CARE IF:  You develop a severe headache or confusion.  You have unusual weakness, numbness, or feel faint.  You have severe chest or abdominal pain.  You vomit repeatedly.  You have trouble breathing. MAKE SURE YOU:   Understand these instructions.  Will watch your condition.  Will get help right away if you are not doing well or get worse.   This information is not intended to replace advice given to you by your health care provider. Make sure you discuss any questions you have with your health care provider.   Document Released: 11/05/2005 Document Revised: 03/22/2015 Document Reviewed: 08/28/2013 Elsevier Interactive Patient Education 2016 [employer]  

## 2015-12-21 NOTE — ED Notes (Addendum)
Pt to ed with c/o HTN that started last night.  Pt reports "I felt jerky in my chest and I felt weak"  Pt states her blood pressure 234/101.  Pt denies chest pain. Does report last night she felt sob and weak and nervous.  Pt does not appear to be anxious at triage. Pt alert and oriented and skin warm and dry.

## 2015-12-21 NOTE — ED Provider Notes (Signed)
[facility] Emergency Department Provider Note     Time seen: ----------------------------------------- 12:50 PM on 12/21/2015 -----------------------------------------    I have reviewed the triage vital signs and the nursing notes.   HISTORY  Chief Complaint Hypertension    HPI Jocelyn Elliott is a 82 y.o. female who presents ER for high blood pressure. Patient had a headache but otherwise denied any symptoms. Patient states she monitors her blood pressure at home her blood pressures been elevated well into the 123456 systolic. She took her metoprolol early today, was concerned she may have a stroke. She denies any fevers, chills, chest pain, shortness of breath but has had recently some diarrhea.   Past Medical History  Diagnosis Date  . Arthritis   . Depression   . Diabetes mellitus ([place])   . Hypertension   . Hyperlipidemia   . Blood in stool   . CAD (coronary artery disease)   . Carotid arterial disease [facility])     Patient Active Problem List   Diagnosis Date Noted  . Health care maintenance 10/28/2015  . SOB (shortness of breath) on exertion 09/20/2015  . Knee pain, bilateral 09/20/2015  . Sleeping difficulty 07/24/2015  . Hepatomegaly 07/24/2015  . Generalized anxiety disorder 04/23/2015  . UTI (urinary tract infection) 05/22/2014  . Hip pain 10/11/2013  . Tendonitis 07/19/2013  . Leg numbness 04/07/2013  . CAD (coronary artery disease) 09/05/2012  . Hypertension 09/05/2012  . Hypercholesteremia 09/05/2012  . Diabetes mellitus ([place]) 09/05/2012  . Carotid arterial disease ([place]) 09/05/2012  . Anemia 09/05/2012    Past Surgical History  Procedure Laterality Date  . Cholecystectomy  1993  . Appendectomy  1992  . Abdominal hysterectomy  1972    ovaries left in place  . Total hip arthroplasty  05/03/2011    left 12, right 11/07  . Back surgery  06/08/2010    L3, L4, L5  . Foot surgery  06/2007    Left  . Carpal tunnel release   1991, 92    right then left   . Carotid artery angioplasty  11/03  . Right carotid artery surgery  01/09/13    Dr. Delana Meyer @ [facility]    Allergies Ciprofloxacin; Levaquin; and Tequin  Social History Social History  Substance Use Topics  . Smoking status: Former Smoker    Quit date: 11/19/1989  . Smokeless tobacco: Never Used  . Alcohol Use: No    Review of Systems Constitutional: Negative for fever. Eyes: Negative for visual changes. ENT: Negative for sore throat. Cardiovascular: Negative for chest pain. Respiratory: Negative for shortness of breath. Gastrointestinal: Negative for abdominal pain, vomiting. Positive for diarrhea Genitourinary: Negative for dysuria. Musculoskeletal: Negative for back pain. Skin: Negative for rash. Neurological: Negative for headaches, focal weakness or numbness.  10-point ROS otherwise negative.  ____________________________________________   PHYSICAL EXAM:  VITAL SIGNS: ED Triage Vitals  Enc Vitals Group     BP 12/21/15 0947 176/83 mmHg     Pulse Rate 12/21/15 0947 71     Resp 12/21/15 0947 18     Temp 12/21/15 0947 97.6 F (36.4 C)     Temp Source 12/21/15 0947 Oral     SpO2 12/21/15 0947 96 %     Weight 12/21/15 0947 140 lb (63.504 kg)     Height 12/21/15 0947 4\' 11"  (1.499 m)     Head Cir --      Peak Flow --      Pain Score 12/21/15 0950 0  Pain Loc --      Pain Edu? --      Excl. in [place]? --     Constitutional: Alert and oriented. Well appearing and in no distress. Eyes: Conjunctivae are normal. PERRL. Normal extraocular movements. ENT   Head: Normocephalic and atraumatic.   Nose: No congestion/rhinnorhea.   Mouth/Throat: Mucous membranes are moist.   Neck: No stridor. Cardiovascular: Normal rate, regular rhythm. Normal and symmetric distal pulses are present in all extremities. No murmurs, rubs, or gallops. Respiratory: Normal respiratory effort without tachypnea nor retractions. Breath sounds are clear  and equal bilaterally. No wheezes/rales/rhonchi. Gastrointestinal: Soft and nontender. No distention. No abdominal bruits.  Musculoskeletal: Nontender with normal range of motion in all extremities. No joint effusions.  No lower extremity tenderness nor edema. Neurologic:  Normal speech and language. No gross focal neurologic deficits are appreciated. Speech is normal. No gait instability. Skin:  Skin is warm, dry and intact. No rash noted. Psychiatric: Mood and affect are normal. Speech and behavior are normal. Patient exhibits appropriate insight and judgment. ____________________________________________  EKG: Interpreted by me. Normal sinus rhythm rate of 64 bpm, normal PR interval, normal QRS, normal QT interval. Normal axis.  ____________________________________________  ED COURSE:  Pertinent labs & imaging results that were available during my care of the patient were reviewed by me and considered in my medical decision making (see chart for details). Patient is in no acute distress, essentially a symptomatically hypertension. I may increase her HCTZ from 12.5 to 25 mg. I will check basic labs. ____________________________________________    LABS (pertinent positives/negatives)  Labs Reviewed  BASIC METABOLIC PANEL - Abnormal; Notable for the following:    Glucose, Bld 149 (*)    GFR calc non Af Amer 58 (*)    All other components within normal limits  CBC - Abnormal; Notable for the following:    RDW 18.9 (*)    All other components within normal limits  TROPONIN I   ____________________________________________  FINAL ASSESSMENT AND PLAN  Hypertension  Plan: Patient with labs and imaging as dictated above. Patient with unremarkable labs and workup here. EKG is unchanged since last year. I will increase her HCTZ and have her follow-up with her doctor in a week. She has also had diarrhea, I'll prescribe Lomotil. Should it continue she can follow-up with  gastroenterology.   Earleen Newport, MD   Earleen Newport, MD 12/21/15 [phone]

## 2015-12-21 NOTE — ED Notes (Signed)
Pt states she monitors her blood pressure at home. States her blood pressure has been elevated since last night. Pt states took metoprolol at 0330 today.

## 2015-12-24 ENCOUNTER — Other Ambulatory Visit: Payer: Self-pay | Admitting: Internal Medicine

## 2015-12-26 ENCOUNTER — Ambulatory Visit (INDEPENDENT_AMBULATORY_CARE_PROVIDER_SITE_OTHER): Payer: Medicare Other | Admitting: Internal Medicine

## 2015-12-26 ENCOUNTER — Encounter: Payer: Self-pay | Admitting: Internal Medicine

## 2015-12-26 VITALS — BP 130/70 | HR 71 | Temp 97.8°F | Resp 18 | Ht <= 58 in | Wt 140.4 lb

## 2015-12-26 DIAGNOSIS — E119 Type 2 diabetes mellitus without complications: Secondary | ICD-10-CM | POA: Diagnosis not present

## 2015-12-26 DIAGNOSIS — I2510 Atherosclerotic heart disease of native coronary artery without angina pectoris: Secondary | ICD-10-CM

## 2015-12-26 DIAGNOSIS — I779 Disorder of arteries and arterioles, unspecified: Secondary | ICD-10-CM | POA: Diagnosis not present

## 2015-12-26 DIAGNOSIS — I10 Essential (primary) hypertension: Secondary | ICD-10-CM

## 2015-12-26 DIAGNOSIS — F411 Generalized anxiety disorder: Secondary | ICD-10-CM

## 2015-12-26 DIAGNOSIS — D649 Anemia, unspecified: Secondary | ICD-10-CM

## 2015-12-26 DIAGNOSIS — I739 Peripheral vascular disease, unspecified: Secondary | ICD-10-CM

## 2015-12-26 NOTE — Progress Notes (Signed)
Patient ID: Jocelyn Elliott, female   DOB: 12-22-1931, 82 y.o.   MRN: [id number]   Subjective:    Patient ID: Jocelyn Elliott, female    DOB: 12-27-1931, 82 y.o.   MRN: [id number]  HPI  Patient with past history of hypercholesterolemia, diabetes, CAD and hypertension.  She comes in today for an ER follow up.  Was seen in ER 12/21/15 for elevated blood pressure.  HCTZ was increased to 57m q day.  Since her discharge from ER, she does feel better.  Blood pressures overall have been doing better.  She brought her cuff.  Appears to be reading 30+ points more than our cuffs.  States she feels jittery inside.  This occurs intermittently.  No chest pain.  Breathing overall stable.  No headache now. No dizziness.  No abdominal pain or cramping.  Bowels stable now.  No blood in her stool.  Had been having diarrhea.  Saw GI.  Concerned about metformin contributing.  She is only taking one metformin now.  Decreased her dose on 10/08/16.  Reports no diarrhea now.  Sugars reviewed and attached.  Overall ok.     Past Medical History  Diagnosis Date  . Arthritis   . Depression   . Diabetes mellitus ([place]   . Hypertension   . Hyperlipidemia   . Blood in stool   . CAD (coronary artery disease)   . Carotid arterial disease ([facility]    Past Surgical History  Procedure Laterality Date  . Cholecystectomy  1993  . Appendectomy  1992  . Abdominal hysterectomy  1972    ovaries left in place  . Total hip arthroplasty  05/03/2011    left 12, right 11/07  . Back surgery  06/08/2010    L3, L4, L5  . Foot surgery  06/2007    Left  . Carpal tunnel release  1991, 92    right then left   . Carotid artery angioplasty  11/03  . Right carotid artery surgery  01/09/13    Dr. SDelana Meyer@ [facility]   Family History  Problem Relation Age of Onset  . Arthritis Other     parent  . Diabetes Other     nephew  . Hyperlipidemia Father   . Heart disease Father     myocardial infarction  . Hypertension Father     Parent  .  Cervical cancer Sister   . Breast cancer Neg Hx    Social History   Social History  . Marital Status: Widowed    Spouse Name: N/A  . Number of Children: 1  . Years of Education: 13   Occupational History  . Retired    Social History Main Topics  . Smoking status: Former Smoker    Quit date: 11/19/1989  . Smokeless tobacco: Never Used  . Alcohol Use: No  . Drug Use: No  . Sexual Activity: Not Asked   Other Topics Concern  . None   Social History Narrative   Regular exercise-no   Caffeine Use-yes    Outpatient Encounter Prescriptions as of 12/26/2015  Medication Sig  . aspirin 81 MG tablet Take 81 mg by mouth daily.  . Biotin 5000 MCG CAPS Take 1 capsule by mouth daily.  . cetirizine (ZYRTEC) 10 MG tablet Take 10 mg by mouth daily.  . hydrochlorothiazide (HYDRODIURIL) 25 MG tablet Take 1 tablet (25 mg total) by mouth daily.  . metFORMIN (GLUCOPHAGE-XR) 500 MG 24 hr tablet Take 1 tablet (500 mg total) by mouth  2 (two) times daily. (Patient taking differently: Take 500 mg by mouth daily with breakfast. )  . metoprolol succinate (TOPROL-XL) 100 MG 24 hr tablet TAKE 1 TABLET TWICE A DAY  . Omega-3 Fatty Acids (FISH OIL) 1200 MG CAPS Take 1 capsule by mouth daily.  Marland Kitchen omeprazole (PRILOSEC) 20 MG capsule Take 20 mg by mouth daily.  . ramipril (ALTACE) 10 MG capsule Take 1 capsule (10 mg total) by mouth daily.  . traZODone (DESYREL) 50 MG tablet Take 0.5-1 tablets (25-50 mg total) by mouth at bedtime as needed for sleep.  . [DISCONTINUED] atorvastatin (LIPITOR) 40 MG tablet TAKE 1 TABLET DAILY AND THEN ALTERNATE NEXT DAY WITH ONE-HALF (1/2) TABLET (NEED OFFICE VISIT FOR FURTHER REFILLS)  . [DISCONTINUED] diphenoxylate-atropine (LOMOTIL) 2.5-0.025 MG tablet Take 1 tablet by mouth 4 (four) times daily as needed for diarrhea or loose stools.   No facility-administered encounter medications on file as of 12/26/2015.    Review of Systems  Constitutional: Negative for appetite change and  unexpected weight change.  HENT: Negative for congestion and sinus pressure.   Respiratory: Negative for cough, chest tightness and shortness of breath.   Cardiovascular: Negative for chest pain, palpitations and leg swelling.  Gastrointestinal: Positive for diarrhea (resolved now. ). Negative for nausea, vomiting and abdominal pain.  Genitourinary: Negative for dysuria and difficulty urinating.  Musculoskeletal: Negative for back pain and joint swelling.  Skin: Negative for color change and rash.  Neurological: Negative for dizziness, light-headedness and headaches.  Psychiatric/Behavioral: Negative for dysphoric mood and agitation.       Objective:    Physical Exam  Constitutional: She appears well-developed and well-nourished. No distress.  HENT:  Nose: Nose normal.  Mouth/Throat: Oropharynx is clear and moist.  Eyes: Conjunctivae are normal. Right eye exhibits no discharge. Left eye exhibits no discharge.  Neck: Neck supple. No thyromegaly present.  Cardiovascular: Normal rate and regular rhythm.   Pulmonary/Chest: Breath sounds normal. No respiratory distress. She has no wheezes.  Abdominal: Soft. Bowel sounds are normal. There is no tenderness.  Musculoskeletal: She exhibits no edema or tenderness.  Lymphadenopathy:    She has no cervical adenopathy.  Skin: No rash noted. No erythema.  Psychiatric: She has a normal mood and affect. Her behavior is normal.    BP 130/70 mmHg  Pulse 71  Temp(Src) 97.8 F (36.6 C) (Oral)  Resp 18  Ht 4' 10" (1.473 m)  Wt 140 lb 6 oz (63.674 kg)  BMI 29.35 kg/m2  SpO2 95% Wt Readings from Last 3 Encounters:  12/26/15 140 lb 6 oz (63.674 kg)  12/21/15 140 lb (63.504 kg)  09/20/15 144 lb (65.318 kg)     Lab Results  Component Value Date   WBC 7.2 12/21/2015   HGB 13.5 12/21/2015   HCT 40.1 12/21/2015   PLT 162 12/21/2015   GLUCOSE 149* 12/21/2015   CHOL 161 09/15/2015   TRIG 177.0* 09/15/2015   HDL 41.20 09/15/2015   LDLDIRECT  107.7 10/27/2013   LDLCALC 85 09/15/2015   ALT 25 09/15/2015   AST 22 09/15/2015   NA 140 12/21/2015   K 3.8 12/21/2015   CL 104 12/21/2015   CREATININE 0.90 12/21/2015   BUN 14 12/21/2015   CO2 26 12/21/2015   TSH 2.39 05/18/2015   INR 1.0 01/10/2013   HGBA1C 8.0* 09/15/2015   MICROALBUR <0.7 05/18/2015    Dg Chest 2 View  12/21/2015  CLINICAL DATA:  Headache since last night, elevated blood pressure this morning upon waking,  history diabetes mellitus, hypertension, coronary artery disease, former smoker EXAM: CHEST  2 VIEW COMPARISON:  01/02/2014 FINDINGS: Borderline enlargement of cardiac silhouette. Atherosclerotic calcification aorta. Mediastinal contours and pulmonary vascularity normal. Chronic peribronchial thickening. No infiltrate, pleural effusion or pneumothorax. Eventration RIGHT diaphragm again seen. Bones demineralized. Prior lumbar fusion. IMPRESSION: Bronchitic changes without infiltrate. Electronically Signed   By: Lavonia Dana M.D.   On: 12/21/2015 11:49       Assessment & Plan:   Problem List Items Addressed This Visit    Anemia    Hgb in ER wnl.  Follow.  No bleeding.        CAD (coronary artery disease) - Primary    Has known CAD.  Overdue f/u with cardiology.  Recently evaluated in ER for elevated blood pressure.  Has intermittent episodes where feel jittery.  Blood pressure better.  Schedule f/u with her cardiologist to determine no further cardiac w/up warranted.        Relevant Orders   Ambulatory referral to Cardiology   Carotid arterial disease ([place])    Followed by vascular surgery.  Recommended f/u in one year from last visit.        Diabetes mellitus ([place])    Only on one metformin daily now.  Sugars attached.  Continue low carb diet.  Follow met b and a1c.       Generalized anxiety disorder    On trazodone to help her sleep.  Follow.        Hypertension    Blood pressure elevated - to ER.  See ER note.  HCTZ increased.  Blood pressure  currently doing well.  Follow metabolic panel.  Her machine reads high.  Hold on making any further adjustments.            Einar Pheasant, MD

## 2015-12-26 NOTE — Progress Notes (Signed)
Pre-visit discussion using our clinic review tool. No additional management support is needed unless otherwise documented below in the visit note.  

## 2015-12-27 ENCOUNTER — Encounter: Payer: Self-pay | Admitting: Internal Medicine

## 2015-12-27 NOTE — Assessment & Plan Note (Signed)
Hgb in ER wnl.  Follow.  No bleeding.

## 2015-12-27 NOTE — Assessment & Plan Note (Signed)
Blood pressure elevated - to ER.  See ER note.  HCTZ increased.  Blood pressure currently doing well.  Follow metabolic panel.  Her machine reads high.  Hold on making any further adjustments.

## 2015-12-27 NOTE — Assessment & Plan Note (Addendum)
Followed by vascular surgery.  Recommended f/u in one year from last visit.

## 2015-12-27 NOTE — Assessment & Plan Note (Signed)
On trazodone to help her sleep.  Follow.

## 2015-12-27 NOTE — Assessment & Plan Note (Signed)
Has known CAD.  Overdue f/u with cardiology.  Recently evaluated in ER for elevated blood pressure.  Has intermittent episodes where feel jittery.  Blood pressure better.  Schedule f/u with her cardiologist to determine no further cardiac w/up warranted.

## 2015-12-27 NOTE — Assessment & Plan Note (Signed)
Only on one metformin daily now.  Sugars attached.  Continue low carb diet.  Follow met b and a1c.

## 2016-01-16 ENCOUNTER — Encounter: Payer: Self-pay | Admitting: *Deleted

## 2016-01-16 ENCOUNTER — Other Ambulatory Visit (INDEPENDENT_AMBULATORY_CARE_PROVIDER_SITE_OTHER): Payer: Medicare Other

## 2016-01-16 DIAGNOSIS — E7800 Pure hypercholesterolemia, unspecified: Secondary | ICD-10-CM

## 2016-01-16 DIAGNOSIS — I10 Essential (primary) hypertension: Secondary | ICD-10-CM | POA: Diagnosis not present

## 2016-01-16 DIAGNOSIS — E119 Type 2 diabetes mellitus without complications: Secondary | ICD-10-CM

## 2016-01-16 LAB — BASIC METABOLIC PANEL
BUN: 21 mg/dL (ref 6–23)
CHLORIDE: 98 meq/L (ref 96–112)
CO2: 29 meq/L (ref 19–32)
CREATININE: 0.93 mg/dL (ref 0.40–1.20)
Calcium: 9.7 mg/dL (ref 8.4–10.5)
GFR: 61.41 mL/min (ref >60.00)
Glucose, Bld: 174 mg/dL — ABNORMAL HIGH (ref 70–99)
POTASSIUM: 3.8 meq/L (ref 3.5–5.1)
SODIUM: 138 meq/L (ref 135–145)

## 2016-01-16 LAB — HEPATIC FUNCTION PANEL
ALBUMIN: 4.1 g/dL (ref 3.5–5.2)
ALT: 34 U/L (ref 0–35)
AST: 25 U/L (ref 0–37)
Alkaline Phosphatase: 57 U/L (ref 39–117)
BILIRUBIN TOTAL: 0.6 mg/dL (ref 0.2–1.2)
Bilirubin, Direct: 0.1 mg/dL (ref 0.0–0.3)
TOTAL PROTEIN: 7 g/dL (ref 6.0–8.3)

## 2016-01-16 LAB — LIPID PANEL
CHOLESTEROL: 190 mg/dL (ref 0–200)
HDL: 45.6 mg/dL (ref >39.00)
NonHDL: 144.16
TRIGLYCERIDES: 229 mg/dL — AB (ref 0.0–149.0)
Total CHOL/HDL Ratio: 4
VLDL: 45.8 mg/dL — ABNORMAL HIGH (ref 0.0–40.0)

## 2016-01-16 LAB — LDL CHOLESTEROL, DIRECT: Direct LDL: 107 mg/dL

## 2016-01-16 LAB — HEMOGLOBIN A1C: HEMOGLOBIN A1C: 7.9 % — AB (ref 4.6–6.5)

## 2016-01-17 ENCOUNTER — Other Ambulatory Visit: Payer: Self-pay

## 2016-01-17 ENCOUNTER — Ambulatory Visit: Payer: Self-pay

## 2016-01-17 ENCOUNTER — Ambulatory Visit: Payer: Self-pay | Admitting: Internal Medicine

## 2016-01-18 ENCOUNTER — Ambulatory Visit (INDEPENDENT_AMBULATORY_CARE_PROVIDER_SITE_OTHER): Payer: Medicare Other | Admitting: Internal Medicine

## 2016-01-18 ENCOUNTER — Inpatient Hospital Stay: Payer: Medicare Other | Attending: Internal Medicine

## 2016-01-18 ENCOUNTER — Inpatient Hospital Stay (HOSPITAL_BASED_OUTPATIENT_CLINIC_OR_DEPARTMENT_OTHER): Payer: Medicare Other | Admitting: Internal Medicine

## 2016-01-18 ENCOUNTER — Encounter: Payer: Self-pay | Admitting: Internal Medicine

## 2016-01-18 VITALS — BP 136/74 | HR 67 | Temp 98.4°F | Resp 18 | Ht <= 58 in | Wt 141.3 lb

## 2016-01-18 VITALS — BP 120/70 | HR 67 | Temp 98.1°F | Resp 18 | Ht <= 58 in | Wt 141.2 lb

## 2016-01-18 DIAGNOSIS — Z7982 Long term (current) use of aspirin: Secondary | ICD-10-CM | POA: Insufficient documentation

## 2016-01-18 DIAGNOSIS — Z7984 Long term (current) use of oral hypoglycemic drugs: Secondary | ICD-10-CM | POA: Diagnosis not present

## 2016-01-18 DIAGNOSIS — I2510 Atherosclerotic heart disease of native coronary artery without angina pectoris: Secondary | ICD-10-CM

## 2016-01-18 DIAGNOSIS — D649 Anemia, unspecified: Secondary | ICD-10-CM

## 2016-01-18 DIAGNOSIS — Z79899 Other long term (current) drug therapy: Secondary | ICD-10-CM | POA: Diagnosis not present

## 2016-01-18 DIAGNOSIS — F329 Major depressive disorder, single episode, unspecified: Secondary | ICD-10-CM | POA: Insufficient documentation

## 2016-01-18 DIAGNOSIS — I10 Essential (primary) hypertension: Secondary | ICD-10-CM | POA: Insufficient documentation

## 2016-01-18 DIAGNOSIS — D509 Iron deficiency anemia, unspecified: Secondary | ICD-10-CM | POA: Insufficient documentation

## 2016-01-18 DIAGNOSIS — E119 Type 2 diabetes mellitus without complications: Secondary | ICD-10-CM

## 2016-01-18 DIAGNOSIS — M1990 Unspecified osteoarthritis, unspecified site: Secondary | ICD-10-CM | POA: Insufficient documentation

## 2016-01-18 DIAGNOSIS — K219 Gastro-esophageal reflux disease without esophagitis: Secondary | ICD-10-CM | POA: Insufficient documentation

## 2016-01-18 DIAGNOSIS — E785 Hyperlipidemia, unspecified: Secondary | ICD-10-CM | POA: Diagnosis not present

## 2016-01-18 DIAGNOSIS — I779 Disorder of arteries and arterioles, unspecified: Secondary | ICD-10-CM | POA: Diagnosis not present

## 2016-01-18 DIAGNOSIS — Z87891 Personal history of nicotine dependence: Secondary | ICD-10-CM | POA: Diagnosis not present

## 2016-01-18 DIAGNOSIS — F411 Generalized anxiety disorder: Secondary | ICD-10-CM

## 2016-01-18 DIAGNOSIS — E7800 Pure hypercholesterolemia, unspecified: Secondary | ICD-10-CM

## 2016-01-18 DIAGNOSIS — I739 Peripheral vascular disease, unspecified: Secondary | ICD-10-CM

## 2016-01-18 LAB — CBC WITH DIFFERENTIAL/PLATELET
BASOS ABS: 0 10*3/uL (ref 0–0.1)
BASOS PCT: 0 %
EOS ABS: 0.2 10*3/uL (ref 0–0.7)
EOS PCT: 3 %
HCT: 41.9 % (ref 35.0–47.0)
Hemoglobin: 14.5 g/dL (ref 12.0–16.0)
LYMPHS PCT: 33 %
Lymphs Abs: 2.8 10*3/uL (ref 1.0–3.6)
MCH: 31.4 pg (ref 26.0–34.0)
MCHC: 34.6 g/dL (ref 32.0–36.0)
MCV: 90.9 fL (ref 80.0–100.0)
MONO ABS: 0.5 10*3/uL (ref 0.2–0.9)
Monocytes Relative: 6 %
Neutro Abs: 4.9 10*3/uL (ref 1.4–6.5)
Neutrophils Relative %: 58 %
PLATELETS: 180 10*3/uL (ref 150–440)
RBC: 4.61 MIL/uL (ref 3.80–5.20)
RDW: 14.2 % (ref 11.5–14.5)
WBC: 8.4 10*3/uL (ref 3.6–11.0)

## 2016-01-18 LAB — COMPREHENSIVE METABOLIC PANEL
ALBUMIN: 4.1 g/dL (ref 3.5–5.0)
ALT: 46 U/L (ref 14–54)
AST: 44 U/L — AB (ref 15–41)
Alkaline Phosphatase: 64 U/L (ref 38–126)
Anion gap: 7 (ref 5–15)
BUN: 20 mg/dL (ref 6–20)
CHLORIDE: 100 mmol/L — AB (ref 101–111)
CO2: 29 mmol/L (ref 22–32)
CREATININE: 1.02 mg/dL — AB (ref 0.44–1.00)
Calcium: 9.3 mg/dL (ref 8.9–10.3)
GFR calc Af Amer: 58 mL/min — ABNORMAL LOW (ref >60)
GFR, EST NON AFRICAN AMERICAN: 50 mL/min — AB (ref >60)
Glucose, Bld: 109 mg/dL — ABNORMAL HIGH (ref 65–99)
POTASSIUM: 3.7 mmol/L (ref 3.5–5.1)
SODIUM: 136 mmol/L (ref 135–145)
Total Bilirubin: 0.5 mg/dL (ref 0.3–1.2)
Total Protein: 7.6 g/dL (ref 6.5–8.1)

## 2016-01-18 LAB — IRON AND TIBC
Iron: 84 ug/dL (ref 28–170)
SATURATION RATIOS: 22 % (ref 10.4–31.8)
TIBC: 380 ug/dL (ref 250–450)
UIBC: 296 ug/dL

## 2016-01-18 LAB — FERRITIN: FERRITIN: 71 ng/mL (ref 11–307)

## 2016-01-18 MED ORDER — ROSUVASTATIN CALCIUM 20 MG PO TABS: 20.0000 mg | ORAL_TABLET | Freq: Every day | ORAL | Status: DC

## 2016-01-18 MED ORDER — TRAZODONE HCL 50 MG PO TABS: 25.0000 mg | ORAL_TABLET | Freq: Every evening | ORAL | Status: DC | PRN

## 2016-01-18 MED ORDER — HYDROCHLOROTHIAZIDE 25 MG PO TABS: 25.0000 mg | ORAL_TABLET | Freq: Every day | ORAL | Status: DC

## 2016-01-18 NOTE — Progress Notes (Signed)
Pre-visit discussion using our clinic review tool. No additional management support is needed unless otherwise documented below in the visit note.  

## 2016-01-18 NOTE — Progress Notes (Signed)
Pt doing good, no fatigue today. No blood in urine or stool.  Eating and drinking well.

## 2016-01-18 NOTE — Progress Notes (Signed)
[place] OFFICE PROGRESS NOTE  Patient Care Team: Einar Pheasant, MD as PCP - General (Internal Medicine) Einar Pheasant, MD (Internal Medicine) Leia Alf, MD as Referring Physician (Internal Medicine) Isaias Cowman, MD (Internal Medicine) Albesa Seen, MD (Unknown Physician Specialty) Philis Kendall, MD (Unknown Physician Specialty) Brendolyn Patty, MD (Specialist) Katha Cabal, MD (Vascular Surgery) Leia Alf, MD as Referring Physician (Internal Medicine) Isaias Cowman, MD (Internal Medicine) Albesa Seen, MD (Unknown Physician Specialty) Philis Kendall, MD (Unknown Physician Specialty) Brendolyn Patty, MD (Specialist) Katha Cabal, MD (Vascular Surgery)   SUMMARY OF HEMATOLOGIC HISTORY:  # IRON DEFICIENCY ANEMIA- recurrent [? GI blood loss; Dr.Elliot]; IV Ferrahem q 29m   INTERVAL HISTORY:  This is my first interaction with the patient since I joined the practice September 2016. I reviewed the patient's prior charts/pertinent labs in detail; findings are summarized above.    82 year old female patient with above history of recurrent iron deficiency anemia needing IV iron;  Last September 2015 is here for follow-up.  Patient's last IV iron was in November 2016.  Patient denies any blood in stools black colored stools. No weight loss. No chest pain or shortness of breath or cough. No swelling in the legs. Patient has poor tolerance to by mouth iron/nausea.   REVIEW OF SYSTEMS:  A complete 10 point review of system is done which is negative except mentioned above/history of present illness.   PAST MEDICAL HISTORY :  Past Medical History  Diagnosis Date  . Arthritis   . Depression   . Diabetes mellitus ([place])   . Hypertension   . Hyperlipidemia   . Blood in stool   . CAD (coronary artery disease)   . Carotid arterial disease ([place])   . IDA (iron deficiency anemia)   . Cataracts, bilateral   . Chronic cystitis   . Stress  incontinence   . GERD (gastroesophageal reflux disease)   . AV malformation of gastrointestinal tract   . Chronic blood loss anemia     PAST SURGICAL HISTORY :   Past Surgical History  Procedure Laterality Date  . Cholecystectomy  1993  . Appendectomy  1992  . Abdominal hysterectomy  1972    ovaries left in place  . Total hip arthroplasty  05/03/2011    left 12, right 11/07  . Back surgery  06/08/2010    L3, L4, L5  . Foot surgery  06/2007    Left  . Carpal tunnel release  1991, 92    right then left   . Carotid artery angioplasty  11/03  . Right carotid artery surgery  01/09/13    Dr. Delana Meyer @ [facility]    FAMILY HISTORY :   Family History  Problem Relation Age of Onset  . Arthritis Other     parent  . Diabetes Other     nephew  . Hyperlipidemia Father   . Heart disease Father     myocardial infarction  . Hypertension Father     Parent  . Cervical cancer Sister   . Breast cancer Neg Hx   . Rectal cancer Sister     SOCIAL HISTORY:   Social History  Substance Use Topics  . Smoking status: Former Smoker    Types: Cigarettes    Quit date: 11/19/1989  . Smokeless tobacco: Never Used  . Alcohol Use: No    ALLERGIES:  is allergic to ciprofloxacin; levaquin; and tequin.  MEDICATIONS:  Current Outpatient Prescriptions  Medication Sig Dispense Refill  . Biotin 5000  MCG CAPS Take 1 capsule by mouth daily.    . cetirizine (ZYRTEC) 10 MG tablet Take 10 mg by mouth daily.    . hydrochlorothiazide (HYDRODIURIL) 25 MG tablet Take 1 tablet (25 mg total) by mouth daily. 30 tablet 11  . metFORMIN (GLUCOPHAGE-XR) 500 MG 24 hr tablet Take 1 tablet (500 mg total) by mouth 2 (two) times daily. (Patient taking differently: Take 500 mg by mouth daily with breakfast. ) 180 tablet 1  . metoprolol succinate (TOPROL-XL) 100 MG 24 hr tablet TAKE 1 TABLET TWICE A DAY 180 tablet 0  . omeprazole (PRILOSEC) 20 MG capsule Take 20 mg by mouth daily.    . ramipril (ALTACE) 10 MG capsule Take  1 capsule (10 mg total) by mouth daily. 90 capsule 3  . rosuvastatin (CRESTOR) 20 MG tablet Take 1 tablet (20 mg total) by mouth daily. 30 tablet 1  . traZODone (DESYREL) 50 MG tablet Take 0.5-1 tablets (25-50 mg total) by mouth at bedtime as needed for sleep. 30 tablet 1  . aspirin 81 MG tablet Take 81 mg by mouth daily.    . Omega-3 Fatty Acids (FISH OIL) 1200 MG CAPS Take 1 capsule by mouth daily.     No current facility-administered medications for this visit.    PHYSICAL EXAMINATION:   BP 136/74 mmHg  Pulse 67  Temp(Src) 98.4 F (36.9 C) (Tympanic)  Resp 18  Ht 4\' 10"  (1.473 m)  Wt 141 lb 5 oz (64.1 kg)  BMI 29.54 kg/m2  Filed Weights   01/18/16 1448  Weight: 141 lb 5 oz (64.1 kg)    GENERAL: Well-nourished well-developed; Alert, no distress and comfortable.  Alone.  EYES: no pallor or icterus OROPHARYNX: no thrush or ulceration; good dentition  NECK: supple, no masses felt LYMPH:  no palpable lymphadenopathy in the cervical, axillary or inguinal regions LUNGS: clear to auscultation and  No wheeze or crackles HEART/CVS: regular rate & rhythm and no murmurs; No lower extremity edema ABDOMEN:abdomen soft, non-tender and normal bowel sounds Musculoskeletal:no cyanosis of digits and no clubbing  PSYCH: alert & oriented x 3 with fluent speech NEURO: no focal motor/sensory deficits SKIN:  no rashes or significant lesions  LABORATORY DATA:  I have reviewed the data as listed    Component Value Date/Time   NA 136 01/18/2016 1358   NA 143 01/10/2013 0314   K 3.7 01/18/2016 1358   K 4.0 01/10/2013 0314   CL 100* 01/18/2016 1358   CL 109* 01/10/2013 0314   CO2 29 01/18/2016 1358   CO2 27 01/10/2013 0314   GLUCOSE 109* 01/18/2016 1358   GLUCOSE 119* 01/10/2013 0314   BUN 20 01/18/2016 1358   BUN 15 01/10/2013 0314   CREATININE 1.02* 01/18/2016 1358   CREATININE 1.03 01/10/2013 0314   CREATININE 1.13* 09/18/2012 0848   CALCIUM 9.3 01/18/2016 1358   CALCIUM 8.1*  01/10/2013 0314   PROT 7.6 01/18/2016 1358   PROT 7.2 12/20/2011 1521   ALBUMIN 4.1 01/18/2016 1358   ALBUMIN 3.9 12/20/2011 1521   AST 44* 01/18/2016 1358   AST 31 12/20/2011 1521   ALT 46 01/18/2016 1358   ALT 37 12/20/2011 1521   ALKPHOS 64 01/18/2016 1358   ALKPHOS 57 12/20/2011 1521   BILITOT 0.5 01/18/2016 1358   BILITOT 0.5 12/20/2011 1521   GFRNONAA 50* 01/18/2016 1358   GFRNONAA 52* 01/10/2013 0314   GFRNONAA 47* 09/18/2012 0848   GFRNONAA 54* 12/20/2011 1521   GFRAA 58* 01/18/2016 1358  GFRAA >60 01/10/2013 0314   GFRAA 54* 09/18/2012 0848   GFRAA >60 12/20/2011 1521    No results found for: SPEP, UPEP  Lab Results  Component Value Date   WBC 8.4 01/18/2016   NEUTROABS 4.9 01/18/2016   HGB 14.5 01/18/2016   HCT 41.9 01/18/2016   MCV 90.9 01/18/2016   PLT 180 01/18/2016      Chemistry      Component Value Date/Time   NA 136 01/18/2016 1358   NA 143 01/10/2013 0314   K 3.7 01/18/2016 1358   K 4.0 01/10/2013 0314   CL 100* 01/18/2016 1358   CL 109* 01/10/2013 0314   CO2 29 01/18/2016 1358   CO2 27 01/10/2013 0314   BUN 20 01/18/2016 1358   BUN 15 01/10/2013 0314   CREATININE 1.02* 01/18/2016 1358   CREATININE 1.03 01/10/2013 0314   CREATININE 1.13* 09/18/2012 0848      Component Value Date/Time   CALCIUM 9.3 01/18/2016 1358   CALCIUM 8.1* 01/10/2013 0314   ALKPHOS 64 01/18/2016 1358   ALKPHOS 57 12/20/2011 1521   AST 44* 01/18/2016 1358   AST 31 12/20/2011 1521   ALT 46 01/18/2016 1358   ALT 37 12/20/2011 1521   BILITOT 0.5 01/18/2016 1358   BILITOT 0.5 12/20/2011 1521         ASSESSMENT & PLAN:   # - recurrent iron deficiency anemia- patient last received IV iron Feraheme end of November 2016.  Today hemoglobin is 13.5 MCV is 89. Ferritin iron studies pending. Clinically I do not think patient will need IV iron this time.  # patient will follow-up with labs/possible IV iron in 4 months; and again with me with labs possible IV iron in 8  months.  # 15 minutes face-to-face with the patient discussing the above plan of care; more than 50% of time spent on  counseling and coordination.      Cammie Sickle, MD 01/18/2016 2:58 PM

## 2016-01-18 NOTE — Progress Notes (Signed)
Patient ID: Jocelyn Elliott, female   DOB: [birth year], 82 y.o.   MRN: [id number]   Subjective:    Patient ID: Jocelyn Elliott, female    DOB: Jul 22, 1932, 82 y.o.   MRN: [id number]  HPI  Patient with past history of hypercholesterolemia, diabetes, CAD, carotid artery disease and hypertension.  She comes in today to follow up on these issues.  She feels she is doing well.  Increased sugar.  a1c 7.9.  States had some loose stool with increased metformin previously.  Now on extended release form.  No bowel issues now.  Not watching her diet.  Plans to get more serious with diet and exercise.  Cholesterol is still elevated above goal.  LDL 107. Has known CAD.  Discussed with her today.  She is in agreement to change to crestor.  Blood pressure has been doing better on the whole hctz.  No chest pain.  No tightness.  Breathing stable.  No nausea or vomiting.  Bowels stable.     Past Medical History  Diagnosis Date  . Arthritis   . Depression   . Diabetes mellitus ([place])   . Hypertension   . Hyperlipidemia   . Blood in stool   . CAD (coronary artery disease)   . Carotid arterial disease ([place])   . IDA (iron deficiency anemia)   . Cataracts, bilateral   . Chronic cystitis   . Stress incontinence   . GERD (gastroesophageal reflux disease)   . AV malformation of gastrointestinal tract   . Chronic blood loss anemia    Past Surgical History  Procedure Laterality Date  . Cholecystectomy  1993  . Appendectomy  1992  . Abdominal hysterectomy  1972    ovaries left in place  . Total hip arthroplasty  05/03/2011    left 12, right 11/07  . Back surgery  06/08/2010    L3, L4, L5  . Foot surgery  06/2007    Left  . Carpal tunnel release  1991, 92    right then left   . Carotid artery angioplasty  11/03  . Right carotid artery surgery  01/09/13    Dr. Delana Meyer @ [facility]   Family History  Problem Relation Age of Onset  . Arthritis Other     parent  . Diabetes Other     nephew  . Hyperlipidemia  Father   . Heart disease Father     myocardial infarction  . Hypertension Father     Parent  . Cervical cancer Sister   . Breast cancer Neg Hx   . Rectal cancer Sister    Social History   Social History  . Marital Status: Widowed    Spouse Name: N/A  . Number of Children: 1  . Years of Education: 13   Occupational History  . Retired    Social History Main Topics  . Smoking status: Former Smoker    Types: Cigarettes    Quit date: 11/19/1989  . Smokeless tobacco: Never Used  . Alcohol Use: No  . Drug Use: No  . Sexual Activity: Not Asked   Other Topics Concern  . None   Social History Narrative   Regular exercise-no   Caffeine Use-yes    Outpatient Encounter Prescriptions as of 01/18/2016  Medication Sig  . aspirin 81 MG tablet Take 81 mg by mouth daily.  . Biotin 5000 MCG CAPS Take 1 capsule by mouth daily.  . cetirizine (ZYRTEC) 10 MG tablet Take 10 mg by mouth daily.  Marland Kitchen  hydrochlorothiazide (HYDRODIURIL) 25 MG tablet Take 1 tablet (25 mg total) by mouth daily.  . metFORMIN (GLUCOPHAGE-XR) 500 MG 24 hr tablet Take 1 tablet (500 mg total) by mouth 2 (two) times daily. (Patient taking differently: Take 500 mg by mouth daily with breakfast. )  . metoprolol succinate (TOPROL-XL) 100 MG 24 hr tablet TAKE 1 TABLET TWICE A DAY  . Omega-3 Fatty Acids (FISH OIL) 1200 MG CAPS Take 1 capsule by mouth daily.  Marland Kitchen omeprazole (PRILOSEC) 20 MG capsule Take 20 mg by mouth daily.  . ramipril (ALTACE) 10 MG capsule Take 1 capsule (10 mg total) by mouth daily.  . traZODone (DESYREL) 50 MG tablet Take 0.5-1 tablets (25-50 mg total) by mouth at bedtime as needed for sleep.  . [DISCONTINUED] atorvastatin (LIPITOR) 40 MG tablet TAKE 1 TABLET DAILY AND THEN ALTERNATE NEXT DAY WITH ONE-HALF (1/2) TABLET (NEED OFFICE VISIT FOR FURTHER REFILLS)  . [DISCONTINUED] hydrochlorothiazide (HYDRODIURIL) 25 MG tablet Take 1 tablet (25 mg total) by mouth daily.  . [DISCONTINUED] traZODone (DESYREL) 50 MG  tablet Take 0.5-1 tablets (25-50 mg total) by mouth at bedtime as needed for sleep.  . rosuvastatin (CRESTOR) 20 MG tablet Take 1 tablet (20 mg total) by mouth daily.   No facility-administered encounter medications on file as of 01/18/2016.     Review of Systems  Constitutional: Negative for appetite change and unexpected weight change.  HENT: Negative for congestion and sinus pressure.   Respiratory: Negative for cough, chest tightness and shortness of breath.   Cardiovascular: Negative for chest pain, palpitations and leg swelling.  Gastrointestinal: Negative for nausea, vomiting, abdominal pain and diarrhea.  Genitourinary: Negative for dysuria and difficulty urinating.  Musculoskeletal: Negative for myalgias and joint swelling.  Skin: Negative for color change and rash.  Neurological: Negative for dizziness, light-headedness and headaches.  Psychiatric/Behavioral: Negative for dysphoric mood and agitation.       Objective:    Physical Exam  Constitutional: She appears well-developed and well-nourished. No distress.  HENT:  Nose: Nose normal.  Mouth/Throat: Oropharynx is clear and moist.  Neck: Neck supple. No thyromegaly present.  Cardiovascular: Normal rate and regular rhythm.   Pulmonary/Chest: Breath sounds normal. No respiratory distress. She has no wheezes.  Abdominal: Soft. Bowel sounds are normal. There is no tenderness.  Musculoskeletal: She exhibits no edema or tenderness.  Lymphadenopathy:    She has no cervical adenopathy.  Skin: No rash noted. No erythema.  Psychiatric: She has a normal mood and affect. Her behavior is normal.    BP 120/70 mmHg  Pulse 67  Temp(Src) 98.1 F (36.7 C) (Oral)  Resp 18  Ht 4\' 10"  (1.473 m)  Wt 141 lb 4 oz (64.071 kg)  BMI 29.53 kg/m2  SpO2 95% Wt Readings from Last 3 Encounters:  01/18/16 141 lb 5 oz (64.1 kg)  01/18/16 141 lb 4 oz (64.071 kg)  12/26/15 140 lb 6 oz (63.674 kg)     Lab Results  Component Value Date    WBC 8.4 01/18/2016   HGB 14.5 01/18/2016   HCT 41.9 01/18/2016   PLT 180 01/18/2016   GLUCOSE 109* 01/18/2016   CHOL 190 01/16/2016   TRIG 229.0* 01/16/2016   HDL 45.60 01/16/2016   LDLDIRECT 107.0 01/16/2016   LDLCALC 85 09/15/2015   ALT 46 01/18/2016   AST 44* 01/18/2016   NA 136 01/18/2016   K 3.7 01/18/2016   CL 100* 01/18/2016   CREATININE 1.02* 01/18/2016   BUN 20 01/18/2016   CO2  29 01/18/2016   TSH 2.39 05/18/2015   INR 1.0 01/10/2013   HGBA1C 7.9* 01/16/2016   MICROALBUR <0.7 05/18/2015    Dg Chest 2 View  12/21/2015  CLINICAL DATA:  Headache since last night, elevated blood pressure this morning upon waking, history diabetes mellitus, hypertension, coronary artery disease, former smoker EXAM: CHEST  2 VIEW COMPARISON:  01/02/2014 FINDINGS: Borderline enlargement of cardiac silhouette. Atherosclerotic calcification aorta. Mediastinal contours and pulmonary vascularity normal. Chronic peribronchial thickening. No infiltrate, pleural effusion or pneumothorax. Eventration RIGHT diaphragm again seen. Bones demineralized. Prior lumbar fusion. IMPRESSION: Bronchitic changes without infiltrate. Electronically Signed   By: Lavonia Dana M.D.   On: 12/21/2015 11:49       Assessment & Plan:   Problem List Items Addressed This Visit    Anemia    Followed by hematology.        CAD (coronary artery disease)    Saw cardiology.  Stable.        Relevant Medications   hydrochlorothiazide (HYDRODIURIL) 25 MG tablet   rosuvastatin (CRESTOR) 20 MG tablet   Carotid arterial disease ([facility])    Followed by vascular surgery.  Have her on a yearly schedule now.  Continue risk factor modification.        Relevant Medications   hydrochlorothiazide (HYDRODIURIL) 25 MG tablet   rosuvastatin (CRESTOR) 20 MG tablet   Diabetes mellitus ([place]) - Primary    a1c recently checked 7.9.  Wants to work on diet and exercise.  Hold on changing medication.  Follow.  Low carb diet discussed.         Relevant Medications   rosuvastatin (CRESTOR) 20 MG tablet   Generalized anxiety disorder    Doing better.  Follow.        Hypercholesteremia    Low cholesterol diet and exercise.  LDL 107.  Elevated above goal.  Change to crestor 20mg  q day.  Follow lipid panel and liver function tests.        Relevant Medications   hydrochlorothiazide (HYDRODIURIL) 25 MG tablet   rosuvastatin (CRESTOR) 20 MG tablet   Other Relevant Orders   Hepatic function panel   Hypertension    Blood pressure is doing much better.  Continue same medication regiment.  Follow pressures.  Follow metabolic panel.        Relevant Medications   hydrochlorothiazide (HYDRODIURIL) 25 MG tablet   rosuvastatin (CRESTOR) 20 MG tablet       Einar Pheasant, MD

## 2016-01-18 NOTE — Patient Instructions (Signed)
Stop lipitor.   Start crestor 20mg  per day.

## 2016-01-20 ENCOUNTER — Telehealth: Payer: Self-pay | Admitting: *Deleted

## 2016-01-20 NOTE — Telephone Encounter (Signed)
-----   Message from Cammie Sickle, MD sent at 01/19/2016  5:09 PM EST ----- Please inform pt that she does not need IV iron now; follow up as scheduled. Thx

## 2016-01-20 NOTE — Telephone Encounter (Signed)
Pt informed of lab results. I explained that no iv iron is needed at this time.  She will keep her f/u as scheduled. She expressed gratitude for the nursing call.

## 2016-01-23 ENCOUNTER — Encounter: Payer: Self-pay | Admitting: Internal Medicine

## 2016-01-23 NOTE — Assessment & Plan Note (Signed)
Saw cardiology.  Stable.

## 2016-01-23 NOTE — Assessment & Plan Note (Signed)
Low cholesterol diet and exercise.  LDL 107.  Elevated above goal.  Change to crestor 20mg  q day.  Follow lipid panel and liver function tests.

## 2016-01-23 NOTE — Assessment & Plan Note (Signed)
Blood pressure is doing much better.  Continue same medication regiment.  Follow pressures.  Follow metabolic panel.

## 2016-01-23 NOTE — Assessment & Plan Note (Signed)
Followed by vascular surgery.  Have her on a yearly schedule now.  Continue risk factor modification.

## 2016-01-23 NOTE — Assessment & Plan Note (Signed)
a1c recently checked 7.9.  Wants to work on diet and exercise.  Hold on changing medication.  Follow.  Low carb diet discussed.

## 2016-01-23 NOTE — Assessment & Plan Note (Signed)
Followed by hematology 

## 2016-01-23 NOTE — Assessment & Plan Note (Signed)
Doing better.  Follow.   

## 2016-02-20 ENCOUNTER — Other Ambulatory Visit: Payer: Self-pay | Admitting: Internal Medicine

## 2016-02-27 ENCOUNTER — Other Ambulatory Visit: Payer: Self-pay | Admitting: Internal Medicine

## 2016-02-29 ENCOUNTER — Other Ambulatory Visit (INDEPENDENT_AMBULATORY_CARE_PROVIDER_SITE_OTHER): Payer: Medicare Other

## 2016-02-29 DIAGNOSIS — E7800 Pure hypercholesterolemia, unspecified: Secondary | ICD-10-CM | POA: Diagnosis not present

## 2016-02-29 LAB — HEPATIC FUNCTION PANEL
ALBUMIN: 4.1 g/dL (ref 3.5–5.2)
ALK PHOS: 50 U/L (ref 39–117)
ALT: 37 U/L — ABNORMAL HIGH (ref 0–35)
AST: 29 U/L (ref 0–37)
BILIRUBIN TOTAL: 0.7 mg/dL (ref 0.2–1.2)
Bilirubin, Direct: 0.1 mg/dL (ref 0.0–0.3)
Total Protein: 7 g/dL (ref 6.0–8.3)

## 2016-03-01 ENCOUNTER — Encounter: Payer: Self-pay | Admitting: *Deleted

## 2016-03-01 ENCOUNTER — Other Ambulatory Visit: Payer: Self-pay | Admitting: Internal Medicine

## 2016-03-01 DIAGNOSIS — E7800 Pure hypercholesterolemia, unspecified: Secondary | ICD-10-CM

## 2016-03-01 DIAGNOSIS — I10 Essential (primary) hypertension: Secondary | ICD-10-CM

## 2016-03-01 DIAGNOSIS — E119 Type 2 diabetes mellitus without complications: Secondary | ICD-10-CM

## 2016-03-01 NOTE — Progress Notes (Signed)
Order placed for f/u labs.  

## 2016-03-12 ENCOUNTER — Telehealth: Payer: Self-pay | Admitting: *Deleted

## 2016-03-12 NOTE — Telephone Encounter (Signed)
Patient will need a prior authorization for her Crestor and she also questioned if she should take 25 mg or the 12.5 mg of hydrochlorothiazide. She would also like to know if the [facility]  Pt Contact [phone]

## 2016-03-14 ENCOUNTER — Other Ambulatory Visit: Payer: Self-pay

## 2016-03-14 MED ORDER — ROSUVASTATIN CALCIUM 20 MG PO TABS: 20.0000 mg | ORAL_TABLET | Freq: Every day | ORAL | Status: DC

## 2016-03-14 MED ORDER — HYDROCHLOROTHIAZIDE 25 MG PO TABS: 25.0000 mg | ORAL_TABLET | Freq: Every day | ORAL | Status: DC

## 2016-03-14 NOTE — Telephone Encounter (Signed)
Spoke with the patient, she needs refills on the crestor and HCTZ.  She will need one month of [facility] to [facility] and then a 90day supply to [facility] with a [facility].  Thanks

## 2016-03-15 ENCOUNTER — Other Ambulatory Visit: Payer: Self-pay | Admitting: Internal Medicine

## 2016-03-23 ENCOUNTER — Other Ambulatory Visit: Payer: Self-pay | Admitting: Internal Medicine

## 2016-04-05 ENCOUNTER — Telehealth: Payer: Self-pay | Admitting: Internal Medicine

## 2016-04-05 NOTE — Telephone Encounter (Signed)
That is fine to schedule

## 2016-04-05 NOTE — Telephone Encounter (Signed)
Pt called not feeling well no avail appt to sch pt on her pcp sch. Pt  Throat and body ache/started last friday/coughing/snezzing/congestion/ Can I sch pt on one of your same day appt? Call pt @ [phone]. Thank you!

## 2016-04-05 NOTE — Telephone Encounter (Signed)
Thank you so much pt was called and scheduled.

## 2016-04-05 NOTE — Telephone Encounter (Signed)
Disregard Tonya.

## 2016-04-06 ENCOUNTER — Encounter: Payer: Self-pay | Admitting: Family Medicine

## 2016-04-06 ENCOUNTER — Emergency Department
Admission: EM | Admit: 2016-04-06 | Discharge: 2016-04-06 | Disposition: A | Discharge status: Home / Self Care | Payer: Medicare Other | Attending: Emergency Medicine | Admitting: Emergency Medicine

## 2016-04-06 ENCOUNTER — Encounter: Payer: Self-pay | Admitting: Emergency Medicine

## 2016-04-06 ENCOUNTER — Emergency Department: Payer: Medicare Other

## 2016-04-06 ENCOUNTER — Ambulatory Visit (INDEPENDENT_AMBULATORY_CARE_PROVIDER_SITE_OTHER): Payer: Medicare Other | Admitting: Family Medicine

## 2016-04-06 VITALS — BP 84/48 | HR 69 | Temp 98.6°F | Ht <= 58 in | Wt 135.8 lb

## 2016-04-06 DIAGNOSIS — I2510 Atherosclerotic heart disease of native coronary artery without angina pectoris: Secondary | ICD-10-CM | POA: Insufficient documentation

## 2016-04-06 DIAGNOSIS — M1990 Unspecified osteoarthritis, unspecified site: Secondary | ICD-10-CM | POA: Diagnosis not present

## 2016-04-06 DIAGNOSIS — I952 Hypotension due to drugs: Secondary | ICD-10-CM | POA: Diagnosis not present

## 2016-04-06 DIAGNOSIS — Z7982 Long term (current) use of aspirin: Secondary | ICD-10-CM | POA: Insufficient documentation

## 2016-04-06 DIAGNOSIS — J988 Other specified respiratory disorders: Secondary | ICD-10-CM | POA: Diagnosis not present

## 2016-04-06 DIAGNOSIS — R55 Syncope and collapse: Secondary | ICD-10-CM | POA: Insufficient documentation

## 2016-04-06 DIAGNOSIS — Z7984 Long term (current) use of oral hypoglycemic drugs: Secondary | ICD-10-CM | POA: Insufficient documentation

## 2016-04-06 DIAGNOSIS — Z79899 Other long term (current) drug therapy: Secondary | ICD-10-CM | POA: Diagnosis not present

## 2016-04-06 DIAGNOSIS — I10 Essential (primary) hypertension: Secondary | ICD-10-CM | POA: Insufficient documentation

## 2016-04-06 DIAGNOSIS — E785 Hyperlipidemia, unspecified: Secondary | ICD-10-CM | POA: Diagnosis not present

## 2016-04-06 DIAGNOSIS — E119 Type 2 diabetes mellitus without complications: Secondary | ICD-10-CM | POA: Diagnosis not present

## 2016-04-06 DIAGNOSIS — E860 Dehydration: Secondary | ICD-10-CM | POA: Diagnosis not present

## 2016-04-06 DIAGNOSIS — Z87891 Personal history of nicotine dependence: Secondary | ICD-10-CM | POA: Insufficient documentation

## 2016-04-06 DIAGNOSIS — J40 Bronchitis, not specified as acute or chronic: Secondary | ICD-10-CM | POA: Diagnosis not present

## 2016-04-06 DIAGNOSIS — F329 Major depressive disorder, single episode, unspecified: Secondary | ICD-10-CM | POA: Diagnosis not present

## 2016-04-06 DIAGNOSIS — I959 Hypotension, unspecified: Secondary | ICD-10-CM | POA: Insufficient documentation

## 2016-04-06 LAB — CBC WITH DIFFERENTIAL/PLATELET
BASOS ABS: 0.1 10*3/uL (ref 0–0.1)
Eosinophils Absolute: 0.1 10*3/uL (ref 0–0.7)
Eosinophils Relative: 1 %
HEMATOCRIT: 35.6 % (ref 35.0–47.0)
HEMOGLOBIN: 12 g/dL (ref 12.0–16.0)
Lymphocytes Relative: 14 %
Lymphs Abs: 1.6 10*3/uL (ref 1.0–3.6)
MCH: 31.4 pg (ref 26.0–34.0)
MCHC: 33.8 g/dL (ref 32.0–36.0)
MCV: 93 fL (ref 80.0–100.0)
MONO ABS: 0.8 10*3/uL (ref 0.2–0.9)
Monocytes Relative: 7 %
NEUTROS ABS: 9.4 10*3/uL — AB (ref 1.4–6.5)
Platelets: 254 10*3/uL (ref 150–440)
RBC: 3.83 MIL/uL (ref 3.80–5.20)
RDW: 13.3 % (ref 11.5–14.5)
WBC: 12 10*3/uL — ABNORMAL HIGH (ref 3.6–11.0)

## 2016-04-06 LAB — COMPREHENSIVE METABOLIC PANEL
ALBUMIN: 3.1 g/dL — AB (ref 3.5–5.0)
ALK PHOS: 49 U/L (ref 38–126)
ALT: 40 U/L (ref 14–54)
AST: 67 U/L — ABNORMAL HIGH (ref 15–41)
Anion gap: 13 (ref 5–15)
BILIRUBIN TOTAL: 0.6 mg/dL (ref 0.3–1.2)
BUN: 34 mg/dL — AB (ref 6–20)
CALCIUM: 8.8 mg/dL — AB (ref 8.9–10.3)
CO2: 23 mmol/L (ref 22–32)
CREATININE: 1.8 mg/dL — AB (ref 0.44–1.00)
Chloride: 97 mmol/L — ABNORMAL LOW (ref 101–111)
GFR calc Af Amer: 29 mL/min — ABNORMAL LOW (ref >60)
GFR calc non Af Amer: 25 mL/min — ABNORMAL LOW (ref >60)
GLUCOSE: 136 mg/dL — AB (ref 65–99)
Potassium: 3.1 mmol/L — ABNORMAL LOW (ref 3.5–5.1)
SODIUM: 133 mmol/L — AB (ref 135–145)
Total Protein: 6.6 g/dL (ref 6.5–8.1)

## 2016-04-06 LAB — BASIC METABOLIC PANEL
Anion gap: 15 (ref 5–15)
BUN: 33 mg/dL — AB (ref 6–20)
CHLORIDE: 100 mmol/L — AB (ref 101–111)
CO2: 18 mmol/L — AB (ref 22–32)
CREATININE: 1.67 mg/dL — AB (ref 0.44–1.00)
Calcium: 8.5 mg/dL — ABNORMAL LOW (ref 8.9–10.3)
GFR calc Af Amer: 32 mL/min — ABNORMAL LOW (ref >60)
GFR calc non Af Amer: 28 mL/min — ABNORMAL LOW (ref >60)
GLUCOSE: 144 mg/dL — AB (ref 65–99)
Potassium: 3.8 mmol/L (ref 3.5–5.1)
Sodium: 133 mmol/L — ABNORMAL LOW (ref 135–145)

## 2016-04-06 LAB — TROPONIN I: Troponin I: 0.03 ng/mL (ref <0.031)

## 2016-04-06 LAB — BRAIN NATRIURETIC PEPTIDE: B NATRIURETIC PEPTIDE 5: 63 pg/mL (ref 0.0–100.0)

## 2016-04-06 MED ORDER — IPRATROPIUM-ALBUTEROL 0.5-2.5 (3) MG/3ML IN SOLN
3.0000 mL | Freq: Once | RESPIRATORY_TRACT | Status: AC
  Administered 2016-04-06: 3 mL via RESPIRATORY_TRACT
  Filled 2016-04-06: qty 3

## 2016-04-06 MED ORDER — HYDROCOD POLST-CPM POLST ER 10-8 MG/5ML PO SUER: 5.0000 mL | Freq: Two times a day (BID) | ORAL | Status: DC

## 2016-04-06 MED ORDER — SODIUM CHLORIDE 0.9 % IV SOLN
Freq: Once | INTRAVENOUS | Status: AC
  Administered 2016-04-06: 13:00:00 via INTRAVENOUS

## 2016-04-06 MED ORDER — AZITHROMYCIN 250 MG PO TABS: ORAL_TABLET | ORAL | Status: DC

## 2016-04-06 NOTE — ED Notes (Signed)
Patient ambulated to room commode with one-person assist.

## 2016-04-06 NOTE — Progress Notes (Signed)
Subjective:  Patient ID: Jocelyn Elliott, female    DOB: 1932-01-19  Age: 82 y.o. MRN: [id number]  CC: Cough/URI symptoms  HPI:  82 year old female with a past medical history of CAD, DM 2, hypertension, hyperlipidemia presents with the above complaint.  Patient states that she's not been feeling well for the past week. She states that she's been running a low-grade fever (100.2). She's been experiencing severe cough. Cough is productive. She's also had some drainage from her nose. Additionally, she's had sore throat. She reports decreased appetite and 5 and weight loss over this period of time. She has tried over-the-counter Mucinex and Delsym with no real improvement. No known exacerbating factors. She does have some associated shortness of breath but this is been noted in the problem was previously.  Additionally, her daughter notes that she's not her normal self. She's quite fatigued.  Social Hx   Social History   Social History  . Marital Status: Widowed    Spouse Name: N/A  . Number of Children: 1  . Years of Education: 13   Occupational History  . Retired    Social History Main Topics  . Smoking status: Former Smoker    Types: Cigarettes    Quit date: 11/19/1989  . Smokeless tobacco: Never Used  . Alcohol Use: No  . Drug Use: No  . Sexual Activity: Not Asked   Other Topics Concern  . None   Social History Narrative   Regular exercise-no   Caffeine Use-yes   Review of Systems  Constitutional: Positive for fever and fatigue.  HENT: Positive for sore throat.   Respiratory: Positive for cough.    Objective:  BP 84/48 mmHg  Pulse 69  Temp(Src) 98.6 F (37 C) (Oral)  Ht 4\' 10"  (1.473 m)  Wt 135 lb 12.8 oz (61.598 kg)  BMI 28.39 kg/m2  SpO2 93%  BP/Weight 04/06/2016 [id number]  Systolic BP 92 84 [id number]  Diastolic BP 47 48 74  Wt. (Lbs) 135 135.8 141.32  BMI 27.25 28.39 29.54   Physical Exam  Constitutional: She appears well-developed.  Appears  lethargic. She frequently close her eyes and drifts in the seat during history and exam.  HENT:  Mouth/Throat: Oropharynx is clear and moist.  Cardiovascular: Normal rate and regular rhythm.   2/6 systolic murmur.  Pulmonary/Chest: Effort normal and breath sounds normal. She has no wheezes. She has no rales.  Neurological:  Patient answers questions appropriately but is very lethargic.  Vitals reviewed.  Lab Results  Component Value Date   WBC 8.4 01/18/2016   HGB 14.5 01/18/2016   HCT 41.9 01/18/2016   PLT 180 01/18/2016   GLUCOSE 109* 01/18/2016   CHOL 190 01/16/2016   TRIG 229.0* 01/16/2016   HDL 45.60 01/16/2016   LDLDIRECT 107.0 01/16/2016   LDLCALC 85 09/15/2015   ALT 37* 02/29/2016   AST 29 02/29/2016   NA 136 01/18/2016   K 3.7 01/18/2016   CL 100* 01/18/2016   CREATININE 1.02* 01/18/2016   BUN 20 01/18/2016   CO2 29 01/18/2016   TSH 2.39 05/18/2015   INR 1.0 01/10/2013   HGBA1C 7.9* 01/16/2016   MICROALBUR <0.7 05/18/2015    Assessment & Plan:   Problem List Items Addressed This Visit    Respiratory infection    New problem. O2 sat 93 % on Room air. Lungs clear. Patient lethargic and hypotensive. Sent to ED.       Hypotension - Primary    New problem. Patient lethargic  and not well. Found to be hypotensive with a BP of 84/48.  Per the daughter her blood pressure medication was recently increased. Advised to stop HCTZ. Given the above, I recommended that she be evaluated in the ED as she needs laboratory studies & fluids. Patient and daughter in agreement. Travel by private vehicle was attempted but patient nearly fainted. EMS was called and patient was transported to the hospital.        Follow-up: PRN  [place]

## 2016-04-06 NOTE — Assessment & Plan Note (Addendum)
New problem. Patient lethargic and not well. Found to be hypotensive with a BP of 84/48.  Per the daughter her blood pressure medication was recently increased. Advised to stop HCTZ. Given the above, I recommended that she be evaluated in the ED as she needs laboratory studies & fluids. Patient and daughter in agreement. Travel by private vehicle was attempted but patient nearly fainted. EMS was called and patient was transported to the hospital.

## 2016-04-06 NOTE — Discharge Instructions (Signed)
Dehydration Dehydration is when you lose more fluids from the body than you take in. Vital organs such as the kidneys, brain, and heart cannot function without a proper amount of fluids and salt. Any loss of fluids from the body can cause dehydration.  Older adults are at a higher risk of dehydration than younger adults. As we age, our bodies are less able to conserve water and do not respond to temperature changes as well. Also, older adults do not become thirsty as easily or quickly. Because of this, older adults often do not realize they need to increase fluids to avoid dehydration.  CAUSES   Vomiting.  Diarrhea.  Excessive sweating.  Excessive urination.  Fever.  Certain medicines, such as blood pressure medicines called diuretics.  Poorly controlled blood sugars. SIGNS AND SYMPTOMS  Mild dehydration:  Thirst.  Dry lips.  Slightly dry mouth. Moderate dehydration:  Very dry mouth.  Sunken eyes.  Skin does not bounce back quickly when lightly pinched and released.  Dark urine and decreased urine production.  Decreased tear production.  Headache. Severe dehydration:  Very dry mouth.  Extreme thirst.  Rapid, weak pulse (more than 100 beats per minute at rest).  Cold hands and feet.  Not able to sweat in spite of heat.  Rapid breathing.  Blue lips.  Confusion and lethargy.  Difficulty being awakened.  Minimal urine production.  No tears. DIAGNOSIS  Your health care provider will diagnose dehydration based on your symptoms and your exam. Blood and urine tests will help confirm the diagnosis. The diagnostic evaluation should also identify the cause of dehydration. TREATMENT  Treatment of mild or moderate dehydration can often be done at home by increasing the amount of fluids that you drink. It is best to drink small amounts of fluid more often. Drinking too much at one time can make vomiting worse. Severe dehydration needs to be treated at the hospital.  You may be given IV fluids that contain water and electrolytes. HOME CARE INSTRUCTIONS   Ask your health care provider about specific rehydration instructions.  Drink enough fluids to keep your urine clear or pale yellow.  Drink small amounts frequently if you have nausea and vomiting.  Eat as you normally do.  Avoid:  Foods or drinks high in sugar.  Carbonated drinks.  Juice.  Extremely hot or cold fluids.  Drinks with caffeine.  Fatty, greasy foods.  Alcohol.  Tobacco.  Overeating.  Gelatin desserts.  Wash your hands well to avoid spreading bacteria and viruses.  Only take over-the-counter or prescription medicines for pain, discomfort, or fever as directed by your health care provider.  Ask your health care provider if you should continue all prescribed and over-the-counter medicines.  Keep all follow-up appointments with your health care provider. SEEK MEDICAL CARE IF:  You have abdominal pain, and it increases or stays in one area (localizes).  You have a rash, stiff neck, or severe headache.  You are irritable, sleepy, or difficult to awaken.  You are weak, dizzy, or extremely thirsty.  You have a fever. SEEK IMMEDIATE MEDICAL CARE IF:   You are unable to keep fluids down, or you get worse despite treatment.  You have frequent episodes of vomiting or diarrhea.  You have blood or green matter (bile) in your vomit.  You have blood in your stool, or your stool looks black and tarry.  You have not urinated in 6-8 hours, or you have only urinated a small amount of very dark urine.  You faint. MAKE SURE YOU:   Understand these instructions.  Will watch your condition.  Will get help right away if you are not doing well or get worse.   This information is not intended to replace advice given to you by your health care provider. Make sure you discuss any questions you have with your health care provider.   Document Released: 01/26/2004 Document  Revised: 11/10/2013 Document Reviewed: 07/13/2013 Elsevier Interactive Patient Education 2016 [employer].  Near-Syncope Near-syncope (commonly known as near fainting) is sudden weakness, dizziness, or feeling like you might pass out. During an episode of near-syncope, you may also develop pale skin, have tunnel vision, or feel sick to your stomach (nauseous). Near-syncope may occur when getting up after sitting or while standing for a long time. It is caused by a sudden decrease in blood flow to the brain. This decrease can result from various causes or triggers, most of which are not serious. However, because near-syncope can sometimes be a sign of something serious, a medical evaluation is required. The specific cause is often not determined. HOME CARE INSTRUCTIONS  Monitor your condition for any changes. The following actions may help to alleviate any discomfort you are experiencing:  Have someone stay with you until you feel stable.  Lie down right away and prop your feet up if you start feeling like you might faint. Breathe deeply and steadily. Wait until all the symptoms have passed. Most of these episodes last only a few minutes. You may feel tired for several hours.   Drink enough fluids to keep your urine clear or pale yellow.   If you are taking blood pressure or heart medicine, get up slowly when seated or lying down. Take several minutes to sit and then stand. This can reduce dizziness.  Follow up with your health care provider as directed. SEEK IMMEDIATE MEDICAL CARE IF:   You have a severe headache.   You have unusual pain in the chest, abdomen, or back.   You are bleeding from the mouth or rectum, or you have black or tarry stool.   You have an irregular or very fast heartbeat.   You have repeated fainting or have seizure-like jerking during an episode.   You faint when sitting or lying down.   You have confusion.   You have difficulty walking.   You  have severe weakness.   You have vision problems.  MAKE SURE YOU:   Understand these instructions.  Will watch your condition.  Will get help right away if you are not doing well or get worse.   This information is not intended to replace advice given to you by your health care provider. Make sure you discuss any questions you have with your health care provider.   Document Released: 11/05/2005 Document Revised: 11/10/2013 Document Reviewed: 04/10/2013 Elsevier Interactive Patient Education 2016 Elsevier Inc[employer] Acute Bronchitis Bronchitis is inflammation of the airways that extend from the windpipe into the lungs (bronchi). The inflammation often causes mucus to develop. This leads to a cough, which is the most common symptom of bronchitis.  In acute bronchitis, the condition usually develops suddenly and goes away over time, usually in a couple weeks. Smoking, allergies, and asthma can make bronchitis worse. Repeated episodes of bronchitis may cause further lung problems.  CAUSES Acute bronchitis is most often caused by the same virus that causes a cold. The virus can spread from person to person (contagious) through coughing, sneezing, and touching contaminated objects. SIGNS AND SYMPTOMS  Cough.   Fever.   Coughing up mucus.   Body aches.   Chest congestion.   Chills.   Shortness of breath.   Sore throat.  DIAGNOSIS  Acute bronchitis is usually diagnosed through a physical exam. Your health care provider will also ask you questions about your medical history. Tests, such as chest X-rays, are sometimes done to rule out other conditions.  TREATMENT  Acute bronchitis usually goes away in a couple weeks. Oftentimes, no medical treatment is necessary. Medicines are sometimes given for relief of fever or cough. Antibiotic medicines are usually not needed but may be prescribed in certain situations. In some cases, an inhaler may be recommended to help reduce shortness of  breath and control the cough. A cool mist vaporizer may also be used to help thin bronchial secretions and make it easier to clear the chest.  HOME CARE INSTRUCTIONS  Get plenty of rest.   Drink enough fluids to keep your urine clear or pale yellow (unless you have a medical condition that requires fluid restriction). Increasing fluids may help thin your respiratory secretions (sputum) and reduce chest congestion, and it will prevent dehydration.   Take medicines only as directed by your health care provider.  If you were prescribed an antibiotic medicine, finish it all even if you start to feel better.  Avoid smoking and secondhand smoke. Exposure to cigarette smoke or irritating chemicals will make bronchitis worse. If you are a smoker, consider using nicotine gum or skin patches to help control withdrawal symptoms. Quitting smoking will help your lungs heal faster.   Reduce the chances of another bout of acute bronchitis by washing your hands frequently, avoiding people with cold symptoms, and trying not to touch your hands to your mouth, nose, or eyes.   Keep all follow-up visits as directed by your health care provider.  SEEK MEDICAL CARE IF: Your symptoms do not improve after 1 week of treatment.  SEEK IMMEDIATE MEDICAL CARE IF:  You develop an increased fever or chills.   You have chest pain.   You have severe shortness of breath.  You have bloody sputum.   You develop dehydration.  You faint or repeatedly feel like you are going to pass out.  You develop repeated vomiting.  You develop a severe headache. MAKE SURE YOU:   Understand these instructions.  Will watch your condition.  Will get help right away if you are not doing well or get worse.   This information is not intended to replace advice given to you by your health care provider. Make sure you discuss any questions you have with your health care provider.   Document Released: 12/13/2004 Document  Revised: 11/26/2014 Document Reviewed: 04/28/2013 Elsevier Interactive Patient Education [employer].

## 2016-04-06 NOTE — Assessment & Plan Note (Signed)
New problem. O2 sat 93 % on Room air. Lungs clear. Patient lethargic and hypotensive. Sent to ED.

## 2016-04-06 NOTE — ED Provider Notes (Addendum)
[facility] Emergency Department Provider Note        Time seen: ----------------------------------------- 11:54 AM on 04/06/2016 -----------------------------------------    I have reviewed the triage vital signs and the nursing notes.   HISTORY  Chief Complaint Near Syncope and Weakness    HPI Jocelyn Elliott is a 82 y.o. female who presents to ER after a near syncopal event at her doctor's office. Patient states she has been sick since Friday with an upper respiratory infection. On arrival EMS was told that her blood pressure medicine was recently increased. She was hypotensive on EMS arrival around 80 systolic. Patient states she still feels weak.   Past Medical History  Diagnosis Date  . Arthritis   . Depression   . Diabetes mellitus ([place])   . Hypertension   . Hyperlipidemia   . Blood in stool   . CAD (coronary artery disease)   . Carotid arterial disease ([place])   . IDA (iron deficiency anemia)   . Cataracts, bilateral   . Chronic cystitis   . Stress incontinence   . GERD (gastroesophageal reflux disease)   . AV malformation of gastrointestinal tract   . Chronic blood loss anemia     Patient Active Problem List   Diagnosis Date Noted  . Health care maintenance 10/28/2015  . SOB (shortness of breath) on exertion 09/20/2015  . Knee pain, bilateral 09/20/2015  . Sleeping difficulty 07/24/2015  . Hepatomegaly 07/24/2015  . Generalized anxiety disorder 04/23/2015  . UTI (urinary tract infection) 05/22/2014  . Hip pain 10/11/2013  . Tendonitis 07/19/2013  . Leg numbness 04/07/2013  . CAD (coronary artery disease) 09/05/2012  . Hypertension 09/05/2012  . Hypercholesteremia 09/05/2012  . Diabetes mellitus ([place]) 09/05/2012  . Carotid arterial disease ([place]) 09/05/2012  . Anemia 09/05/2012    Past Surgical History  Procedure Laterality Date  . Cholecystectomy  1993  . Appendectomy  1992  . Abdominal hysterectomy  1972    ovaries  left in place  . Total hip arthroplasty  05/03/2011    left 12, right 11/07  . Back surgery  06/08/2010    L3, L4, L5  . Foot surgery  06/2007    Left  . Carpal tunnel release  1991, 92    right then left   . Carotid artery angioplasty  11/03  . Right carotid artery surgery  01/09/13    Dr. Delana Meyer @ [facility]    Allergies Ciprofloxacin; Levaquin; and Tequin  Social History Social History  Substance Use Topics  . Smoking status: Former Smoker    Types: Cigarettes    Quit date: 11/19/1989  . Smokeless tobacco: Never Used  . Alcohol Use: No    Review of Systems Constitutional: Negative for fever. Eyes: Negative for visual changes. ENT: Negative for sore throat. Cardiovascular: Negative for chest pain.Negative for palpitations Respiratory: Positive shortness of breath and cough Gastrointestinal: Negative for abdominal pain, vomiting and diarrhea. Genitourinary: Negative for dysuria. Musculoskeletal: Negative for back pain. Skin: Negative for rash. Neurological: Negative for headaches, Positive for weakness  10-point ROS otherwise negative.  ____________________________________________   PHYSICAL EXAM:  VITAL SIGNS: ED Triage Vitals  Enc Vitals Group     BP 04/06/16 1149 92/47 mmHg     Pulse Rate 04/06/16 1149 55     Resp 04/06/16 1149 15     Temp 04/06/16 1149 97.8 F (36.6 C)     Temp Source 04/06/16 1149 Oral     SpO2 04/06/16 1149 93 %  Weight 04/06/16 1138 135 lb (61.236 kg)     Height 04/06/16 1138 4\' 11"  (1.499 m)     Head Cir --      Peak Flow --      Pain Score 04/06/16 1139 0     Pain Loc --      Pain Edu? --      Excl. in [place]? --     Constitutional: Alert and oriented. Well appearing and in no distress. Eyes: Conjunctivae are normal. PERRL. Normal extraocular movements. ENT   Head: Normocephalic and atraumatic.   Nose: No congestion/rhinnorhea.   Mouth/Throat: Mucous membranes are moist.   Neck: No stridor. Cardiovascular:  Normal rate, regular rhythm. No murmurs, rubs, or gallops. Respiratory: Normal respiratory effort without tachypnea nor retractions. Breath sounds are clear and equal bilaterally. No wheezes/rales/rhonchi. Gastrointestinal: Soft and nontender. Normal bowel sounds Musculoskeletal: Nontender with normal range of motion in all extremities. No lower extremity tenderness nor edema. Neurologic:  Normal speech and language. No gross focal neurologic deficits are appreciated.  Skin:  Skin is warm, dry and intact. No rash noted. Psychiatric: Mood and affect are normal. Speech and behavior are normal.  ____________________________________________  EKG: Interpreted by me. Sinus rhythm with a rate of 59 bpm, normal PR interval, normal QRS, normal QT and will. Normal axis. No evidence acute infarction  ____________________________________________  ED COURSE:  Pertinent labs & imaging results that were available during my care of the patient were reviewed by me and considered in my medical decision making (see chart for details). Patient presents to ER with a near syncopal event. She'll receive IV fluids and we will assess for pneumonia. ____________________________________________    LABS (pertinent positives/negatives)  Labs Reviewed  CBC WITH DIFFERENTIAL/PLATELET - Abnormal; Notable for the following:    WBC 12.0 (*)    Neutro Abs 9.4 (*)    All other components within normal limits  COMPREHENSIVE METABOLIC PANEL - Abnormal; Notable for the following:    Sodium 133 (*)    Potassium 3.1 (*)    Chloride 97 (*)    Glucose, Bld 136 (*)    BUN 34 (*)    Creatinine, Ser 1.80 (*)    Calcium 8.8 (*)    Albumin 3.1 (*)    AST 67 (*)    GFR calc non Af Amer 25 (*)    GFR calc Af Amer 29 (*)    All other components within normal limits  BASIC METABOLIC PANEL - Abnormal; Notable for the following:    Sodium 133 (*)    Chloride 100 (*)    CO2 18 (*)    Glucose, Bld 144 (*)    BUN 33 (*)     Creatinine, Ser 1.67 (*)    Calcium 8.5 (*)    GFR calc non Af Amer 28 (*)    GFR calc Af Amer 32 (*)    All other components within normal limits  BRAIN NATRIURETIC PEPTIDE  TROPONIN I  CBC WITH DIFFERENTIAL/PLATELET    RADIOLOGY  Chest x-ray IMPRESSION: Emphysematous and bronchitic changes question COPD.  No acute abnormalities.  ____________________________________________  FINAL ASSESSMENT AND PLAN  Near syncope, bronchitis, dehydration  Plan: Patient with labs and imaging as dictated above. Patient presents to ER with near syncope and bronchitis symptoms. X-ray does not reveal any pneumonia. Her labs reflect dehydration and she was given a liter and a half of saline. Currently she stood up in the room without feeling dizzy. We have rechecked her kidney  function to ensure some improvement and does look somewhat better. Advised holding her HCTZ and continuing to increase her by mouth intake. She would prefer to go home at this time.   Earleen Newport, MD   Note: This dictation was prepared with Dragon dictation. Any transcriptional errors that result from this process are unintentional   Earleen Newport, MD 04/06/16 [place], MD 04/06/16 [phone]

## 2016-04-06 NOTE — ED Notes (Signed)
Pt arrived by EMS after having a near syncopal episode in the parking lot of [facility]. Pt states she has been "sick since last Friday with an upper respiratory infections". Upon arrival EMS was told that the pt's hydrochlorothyazide dose was recently increased. EMS states that the pt was hypotensive upon arrival with "80s/50s" BP. EMS states CBG taken at [facility] this morning and staff stated it was 130.

## 2016-04-06 NOTE — ED Notes (Signed)
Pt verbalized understanding of discharge instructions. NAD at this time. 

## 2016-04-06 NOTE — Progress Notes (Signed)
Pre visit review using our clinic review tool, if applicable. No additional management support is needed unless otherwise documented below in the visit note. 

## 2016-04-10 ENCOUNTER — Telehealth: Payer: Self-pay | Admitting: Internal Medicine

## 2016-04-10 NOTE — Telephone Encounter (Signed)
Have her remain off hctz and come in and see me at 11:00 on Thursday 04/12/16.  Thanks.

## 2016-04-10 NOTE — Telephone Encounter (Signed)
Patient is aware and will come to the appointment 04/12/16. Could you please create a 30 minute slot?

## 2016-04-10 NOTE — Telephone Encounter (Signed)
Need more information.  How is she feeling now?  How is her blood pressure doing?  Is she eating and drinking?

## 2016-04-10 NOTE — Telephone Encounter (Signed)
Patient states that she is feeling better, but she is tired.  She hasn't yet had a chance to have her blood pressure checked.  Her appetite is slowly coming back, but she has been eating and drinking.  She is not yet driving.  She denies any dizziness, lightheadedness, headaches, or pain.  Dr Jimmye Norman at the ER advised her to restart her HCTZ 04/13/16.  She would like to try HCTZ 12.5 mg instead of HCTZ 25 mg and then come the following week for a follow up visit.  Please advise.

## 2016-04-10 NOTE — Telephone Encounter (Signed)
Pt called to sch a follow up appt from the ED on 04/06/16. No appt sooner avail pt is sch for 04/19/16. Can pt wait for that appt and wait to start the HCTZ medication on 04/19/16? Call pt @ [phone]. Thank you!

## 2016-04-12 ENCOUNTER — Ambulatory Visit (INDEPENDENT_AMBULATORY_CARE_PROVIDER_SITE_OTHER): Payer: Medicare Other | Admitting: Internal Medicine

## 2016-04-12 ENCOUNTER — Encounter: Payer: Self-pay | Admitting: Internal Medicine

## 2016-04-12 VITALS — BP 120/62 | HR 74 | Temp 97.8°F | Ht 59.0 in | Wt 138.8 lb

## 2016-04-12 DIAGNOSIS — R7989 Other specified abnormal findings of blood chemistry: Secondary | ICD-10-CM

## 2016-04-12 DIAGNOSIS — I952 Hypotension due to drugs: Secondary | ICD-10-CM

## 2016-04-12 DIAGNOSIS — D649 Anemia, unspecified: Secondary | ICD-10-CM

## 2016-04-12 DIAGNOSIS — D72829 Elevated white blood cell count, unspecified: Secondary | ICD-10-CM

## 2016-04-12 DIAGNOSIS — J988 Other specified respiratory disorders: Secondary | ICD-10-CM | POA: Diagnosis not present

## 2016-04-12 DIAGNOSIS — R748 Abnormal levels of other serum enzymes: Secondary | ICD-10-CM

## 2016-04-12 LAB — BASIC METABOLIC PANEL
BUN: 21 mg/dL (ref 6–23)
CHLORIDE: 102 meq/L (ref 96–112)
CO2: 33 meq/L — AB (ref 19–32)
Calcium: 9.4 mg/dL (ref 8.4–10.5)
Creatinine, Ser: 1.02 mg/dL (ref 0.40–1.20)
GFR: 55.17 mL/min — ABNORMAL LOW (ref >60.00)
GLUCOSE: 111 mg/dL — AB (ref 70–99)
POTASSIUM: 4.5 meq/L (ref 3.5–5.1)
Sodium: 140 mEq/L (ref 135–145)

## 2016-04-12 LAB — CBC WITH DIFFERENTIAL/PLATELET
BASOS ABS: 0.1 10*3/uL (ref 0.0–0.1)
Basophils Relative: 0.6 % (ref 0.0–3.0)
EOS ABS: 0.2 10*3/uL (ref 0.0–0.7)
Eosinophils Relative: 2.5 % (ref 0.0–5.0)
HEMATOCRIT: 36 % (ref 36.0–46.0)
HEMOGLOBIN: 12 g/dL (ref 12.0–15.0)
LYMPHS PCT: 25.9 % (ref 12.0–46.0)
Lymphs Abs: 2.3 10*3/uL (ref 0.7–4.0)
MCHC: 33.4 g/dL (ref 30.0–36.0)
MCV: 92.5 fl (ref 78.0–100.0)
MONOS PCT: 5.8 % (ref 3.0–12.0)
Monocytes Absolute: 0.5 10*3/uL (ref 0.1–1.0)
Neutro Abs: 5.7 10*3/uL (ref 1.4–7.7)
Neutrophils Relative %: 65.2 % (ref 43.0–77.0)
Platelets: 278 10*3/uL (ref 150.0–400.0)
RBC: 3.89 Mil/uL (ref 3.87–5.11)
RDW: 14 % (ref 11.5–15.5)
WBC: 8.8 10*3/uL (ref 4.0–10.5)

## 2016-04-12 NOTE — Progress Notes (Signed)
Patient ID: Jocelyn Elliott, female   DOB: Nov 03, 1932, 82 y.o.   MRN: [id number]   Subjective:    Patient ID: Jocelyn Elliott, female    DOB: [birth year], 82 y.o.   MRN: [id number]  HPI  Patient here for an ER follow up.  She was seen in the ER on 04/06/16 for near syncopal episode.  She had been sick with URI.  Came here for evaluation.  See Dr Jonathon Jordan note.  Blood pressure low.  EMS called.  Blood pressure - systolic 80.  See ER note.  Was given fluids and oral abx.  Was told to hold her hctz.  She remains off the hctz.  Is eating better.  No nausea or vomiting.  Bowels stable.  Energy gradually improving.  Decreased cough and congestion now.     Past Medical History  Diagnosis Date  . Arthritis   . Depression   . Diabetes mellitus ([place])   . Hypertension   . Hyperlipidemia   . Blood in stool   . CAD (coronary artery disease)   . Carotid arterial disease ([place])   . IDA (iron deficiency anemia)   . Cataracts, bilateral   . Chronic cystitis   . Stress incontinence   . GERD (gastroesophageal reflux disease)   . AV malformation of gastrointestinal tract   . Chronic blood loss anemia    Past Surgical History  Procedure Laterality Date  . Cholecystectomy  1993  . Appendectomy  1992  . Abdominal hysterectomy  1972    ovaries left in place  . Total hip arthroplasty  05/03/2011    left 12, right 11/07  . Back surgery  06/08/2010    L3, L4, L5  . Foot surgery  06/2007    Left  . Carpal tunnel release  1991, 92    right then left   . Carotid artery angioplasty  11/03  . Right carotid artery surgery  01/09/13    Dr. Delana Meyer @ [facility]   Family History  Problem Relation Age of Onset  . Arthritis Other     parent  . Diabetes Other     nephew  . Hyperlipidemia Father   . Heart disease Father     myocardial infarction  . Hypertension Father     Parent  . Cervical cancer Sister   . Breast cancer Neg Hx   . Rectal cancer Sister    Social History   Social History  . Marital  Status: Widowed    Spouse Name: N/A  . Number of Children: 1  . Years of Education: 13   Occupational History  . Retired    Social History Main Topics  . Smoking status: Former Smoker    Types: Cigarettes    Quit date: 11/19/1989  . Smokeless tobacco: Never Used  . Alcohol Use: No  . Drug Use: No  . Sexual Activity: Not Asked   Other Topics Concern  . None   Social History Narrative   Regular exercise-no   Caffeine Use-yes    Outpatient Encounter Prescriptions as of 04/12/2016  Medication Sig  . aspirin 81 MG tablet Take 81 mg by mouth daily.  . Biotin 5000 MCG CAPS Take 1 capsule by mouth daily.  . cetirizine (ZYRTEC) 10 MG tablet Take 10 mg by mouth daily.  . metFORMIN (GLUCOPHAGE-XR) 500 MG 24 hr tablet TAKE 1 TABLET TWICE A DAY (Patient taking differently: TAKE 1 TABLET QD)  . metoprolol succinate (TOPROL-XL) 100 MG 24 hr tablet TAKE 1  TABLET TWICE A DAY  . Omega-3 Fatty Acids (FISH OIL) 1200 MG CAPS Take 1 capsule by mouth daily.  Marland Kitchen omeprazole (PRILOSEC) 20 MG capsule Take 20 mg by mouth daily.  . ramipril (ALTACE) 10 MG capsule Take 1 capsule (10 mg total) by mouth daily.  . rosuvastatin (CRESTOR) 20 MG tablet Take 1 tablet (20 mg total) by mouth daily.  . traZODone (DESYREL) 50 MG tablet Take 0.5-1 tablets (25-50 mg total) by mouth at bedtime as needed for sleep.  . chlorpheniramine-HYDROcodone (TUSSIONEX PENNKINETIC ER) 10-8 MG/5ML SUER Take 5 mLs by mouth 2 (two) times daily. (Patient not taking: Reported on 04/12/2016)  . hydrochlorothiazide (HYDRODIURIL) 25 MG tablet Take 1 tablet (25 mg total) by mouth daily. (Patient not taking: Reported on 04/12/2016)  . [DISCONTINUED] azithromycin (ZITHROMAX Z-PAK) 250 MG tablet Take 2 tablets (500 mg) on  Day 1,  followed by 1 tablet (250 mg) once daily on Days 2 through 5.   No facility-administered encounter medications on file as of 04/12/2016.    Review of Systems  Constitutional: Negative for fever.       Decreased  appetite.    HENT: Positive for congestion. Negative for sinus pressure.   Respiratory: Negative for cough (better now. ), chest tightness and shortness of breath.   Cardiovascular: Negative for chest pain, palpitations and leg swelling.  Gastrointestinal: Negative for nausea, vomiting, abdominal pain and diarrhea.  Genitourinary: Negative for dysuria and difficulty urinating.  Musculoskeletal: Negative for myalgias and joint swelling.  Skin: Negative for color change and rash.  Neurological: Positive for weakness. Negative for dizziness, light-headedness and headaches.  Hematological: Positive for adenopathy.  Psychiatric/Behavioral: Negative for dysphoric mood and agitation.       Objective:     Blood pressure rechecked by me:  128/72  Physical Exam  Constitutional: She appears well-developed and well-nourished. No distress.  HENT:  Nose: Nose normal.  Mouth/Throat: Oropharynx is clear and moist.  Neck: Neck supple. No thyromegaly present.  Cardiovascular: Normal rate and regular rhythm.   Pulmonary/Chest: Breath sounds normal. No respiratory distress. She has no wheezes.  Abdominal: Soft. Bowel sounds are normal. There is no tenderness.  Musculoskeletal: She exhibits no edema or tenderness.  Lymphadenopathy:    She has no cervical adenopathy.  Skin: No rash noted. No erythema.  Psychiatric: She has a normal mood and affect. Her behavior is normal.    BP 120/62 mmHg  Pulse 74  Temp(Src) 97.8 F (36.6 C) (Oral)  Ht 4\' 11"  (1.499 m)  Wt 138 lb 12.8 oz (62.959 kg)  BMI 28.02 kg/m2  SpO2 95% Wt Readings from Last 3 Encounters:  04/12/16 138 lb 12.8 oz (62.959 kg)  04/06/16 135 lb (61.236 kg)  04/06/16 135 lb 12.8 oz (61.598 kg)     Lab Results  Component Value Date   WBC 8.8 04/12/2016   HGB 12.0 04/12/2016   HCT 36.0 04/12/2016   PLT 278.0 04/12/2016   GLUCOSE 111* 04/12/2016   CHOL 190 01/16/2016   TRIG 229.0* 01/16/2016   HDL 45.60 01/16/2016   LDLDIRECT  107.0 01/16/2016   LDLCALC 85 09/15/2015   ALT 40 04/06/2016   AST 67* 04/06/2016   NA 140 04/12/2016   K 4.5 04/12/2016   CL 102 04/12/2016   CREATININE 1.02 04/12/2016   BUN 21 04/12/2016   CO2 33* 04/12/2016   TSH 2.39 05/18/2015   INR 1.0 01/10/2013   HGBA1C 7.9* 01/16/2016   MICROALBUR <0.7 05/18/2015    Dg Chest  2 View  04/06/2016  CLINICAL DATA:  Productive cough with green sputum, fever, nausea and weakness today, treated for URI last week, diabetes mellitus, hypertension, coronary artery disease, former smoker EXAM: CHEST  2 VIEW COMPARISON:  12/21/2015 FINDINGS: Normal heart size, mediastinal contours, and pulmonary vascularity. Atherosclerotic calcification aorta. Bronchitic and emphysematous changes question representing COPD. Lungs clear. No definite infiltrate, pleural effusion or pneumothorax. Bones demineralized. IMPRESSION: Emphysematous and bronchitic changes question COPD. No acute abnormalities. Electronically Signed   By: Lavonia Dana M.D.   On: 04/06/2016 12:18       Assessment & Plan:   Problem List Items Addressed This Visit    Anemia    Recheck cbc today.       Hypotension    Blood pressure low previously.  Better now.  Eating and drinking better now.  Remain off hctz.  Blood pressure doing well.  Follow.  Recheck metabolic panel.        Respiratory infection    Evaluated in ER.  CXR as outlined.  Was treated with zpak.  Better.         Other Visit Diagnoses    Leukocytosis    -  Primary    Relevant Orders    CBC with Differential/Platelet (Completed)    Elevated serum creatinine        Relevant Orders    Basic metabolic panel (Completed)        Einar Pheasant, MD

## 2016-04-12 NOTE — Progress Notes (Signed)
Pre visit review using our clinic review tool, if applicable. No additional management support is needed unless otherwise documented below in the visit note. 

## 2016-04-13 ENCOUNTER — Encounter: Payer: Self-pay | Admitting: *Deleted

## 2016-04-13 ENCOUNTER — Encounter: Payer: Self-pay | Admitting: Internal Medicine

## 2016-04-13 ENCOUNTER — Other Ambulatory Visit: Payer: Medicare Other

## 2016-04-13 NOTE — Assessment & Plan Note (Signed)
Recheck cbc today.   

## 2016-04-13 NOTE — Assessment & Plan Note (Signed)
Blood pressure low previously.  Better now.  Eating and drinking better now.  Remain off hctz.  Blood pressure doing well.  Follow.  Recheck metabolic panel.

## 2016-04-13 NOTE — Assessment & Plan Note (Signed)
Evaluated in ER.  CXR as outlined.  Was treated with zpak.  Better.

## 2016-04-19 ENCOUNTER — Encounter: Payer: Self-pay | Admitting: Internal Medicine

## 2016-04-19 ENCOUNTER — Ambulatory Visit (INDEPENDENT_AMBULATORY_CARE_PROVIDER_SITE_OTHER): Payer: Medicare Other | Admitting: Internal Medicine

## 2016-04-19 VITALS — BP 132/60 | HR 75 | Temp 97.8°F | Resp 17 | Ht 59.0 in | Wt 141.0 lb

## 2016-04-19 DIAGNOSIS — E119 Type 2 diabetes mellitus without complications: Secondary | ICD-10-CM

## 2016-04-19 DIAGNOSIS — I10 Essential (primary) hypertension: Secondary | ICD-10-CM

## 2016-04-19 DIAGNOSIS — D649 Anemia, unspecified: Secondary | ICD-10-CM

## 2016-04-19 DIAGNOSIS — I779 Disorder of arteries and arterioles, unspecified: Secondary | ICD-10-CM

## 2016-04-19 DIAGNOSIS — E7800 Pure hypercholesterolemia, unspecified: Secondary | ICD-10-CM

## 2016-04-19 DIAGNOSIS — I739 Peripheral vascular disease, unspecified: Secondary | ICD-10-CM

## 2016-04-19 NOTE — Assessment & Plan Note (Signed)
Low cholesterol diet and exercise.  On crestor.  Follow lipid panel and liver function tests.  

## 2016-04-19 NOTE — Assessment & Plan Note (Signed)
Sugars as outlined and attached.  She is adjusting her diet.  Follow met b and a1c.

## 2016-04-19 NOTE — Assessment & Plan Note (Signed)
Recent cbc wnl.  

## 2016-04-19 NOTE — Assessment & Plan Note (Signed)
Due to f/u with vascular surgery in July.  Continue risk factor modification.  Continue daily aspirin.

## 2016-04-19 NOTE — Assessment & Plan Note (Signed)
Blood pressure doing well off hctz.  Remain off.  Recent sodium wnl.  Kidney function better.  Follow.

## 2016-04-19 NOTE — Progress Notes (Signed)
Pre-visit discussion using our clinic review tool. No additional management support is needed unless otherwise documented below in the visit note.  

## 2016-04-19 NOTE — Progress Notes (Signed)
Patient ID: Jocelyn Elliott, female   DOB: 07-Dec-1931, 82 y.o.   MRN: [id number]   Subjective:    Patient ID: Jocelyn Elliott, female    DOB: 1932/08/02, 82 y.o.   MRN: [id number]  HPI  Patient here for scheduled follow up.  Was seen in ER 04/06/16 for near syncopal episode.  See last note for details.  Off hctz.  Blood pressure doing better.  Sodium better.  Kidney function better.  She is feeling better.  Eating and drinking.  Sugars doing well.  Recent fasting sugars averaging 120-140.  No chest pain.  Breathing doing well.  No abdominal pain or cramping.  Bowels stable.  Energy better.     Past Medical History  Diagnosis Date  . Arthritis   . Depression   . Diabetes mellitus ([place])   . Hypertension   . Hyperlipidemia   . Blood in stool   . CAD (coronary artery disease)   . Carotid arterial disease ([place])   . IDA (iron deficiency anemia)   . Cataracts, bilateral   . Chronic cystitis   . Stress incontinence   . GERD (gastroesophageal reflux disease)   . AV malformation of gastrointestinal tract   . Chronic blood loss anemia    Past Surgical History  Procedure Laterality Date  . Cholecystectomy  1993  . Appendectomy  1992  . Abdominal hysterectomy  1972    ovaries left in place  . Total hip arthroplasty  05/03/2011    left 12, right 11/07  . Back surgery  06/08/2010    L3, L4, L5  . Foot surgery  06/2007    Left  . Carpal tunnel release  1991, 92    right then left   . Carotid artery angioplasty  11/03  . Right carotid artery surgery  01/09/13    Dr. Delana Meyer @ [facility]   Family History  Problem Relation Age of Onset  . Arthritis Other     parent  . Diabetes Other     nephew  . Hyperlipidemia Father   . Heart disease Father     myocardial infarction  . Hypertension Father     Parent  . Cervical cancer Sister   . Breast cancer Neg Hx   . Rectal cancer Sister    Social History   Social History  . Marital Status: Widowed    Spouse Name: N/A  . Number of  Children: 1  . Years of Education: 13   Occupational History  . Retired    Social History Main Topics  . Smoking status: Former Smoker    Types: Cigarettes    Quit date: 11/19/1989  . Smokeless tobacco: Never Used  . Alcohol Use: No  . Drug Use: No  . Sexual Activity: Not Asked   Other Topics Concern  . None   Social History Narrative   Regular exercise-no   Caffeine Use-yes    Outpatient Encounter Prescriptions as of 04/19/2016  Medication Sig  . aspirin 81 MG tablet Take 81 mg by mouth daily.  . Biotin 5000 MCG CAPS Take 1 capsule by mouth daily.  . cetirizine (ZYRTEC) 10 MG tablet Take 10 mg by mouth daily.  . metFORMIN (GLUCOPHAGE-XR) 500 MG 24 hr tablet TAKE 1 TABLET TWICE A DAY (Patient taking differently: TAKE 1 TABLET QD)  . metoprolol succinate (TOPROL-XL) 100 MG 24 hr tablet TAKE 1 TABLET TWICE A DAY  . Omega-3 Fatty Acids (FISH OIL) 1200 MG CAPS Take 1 capsule by mouth daily.  Marland Kitchen  omeprazole (PRILOSEC) 20 MG capsule Take 20 mg by mouth daily.  . ramipril (ALTACE) 10 MG capsule Take 1 capsule (10 mg total) by mouth daily.  . rosuvastatin (CRESTOR) 20 MG tablet Take 1 tablet (20 mg total) by mouth daily.  . traZODone (DESYREL) 50 MG tablet Take 0.5-1 tablets (25-50 mg total) by mouth at bedtime as needed for sleep.  . [DISCONTINUED] chlorpheniramine-HYDROcodone (TUSSIONEX PENNKINETIC ER) 10-8 MG/5ML SUER Take 5 mLs by mouth 2 (two) times daily. (Patient not taking: Reported on 04/12/2016)  . [DISCONTINUED] hydrochlorothiazide (HYDRODIURIL) 25 MG tablet Take 1 tablet (25 mg total) by mouth daily. (Patient not taking: Reported on 04/12/2016)   No facility-administered encounter medications on file as of 04/19/2016.    Review of Systems  Constitutional: Negative for appetite change and unexpected weight change.  HENT: Negative for congestion and sinus pressure.   Respiratory: Negative for cough, chest tightness and shortness of breath.   Cardiovascular: Negative for chest  pain, palpitations and leg swelling.  Gastrointestinal: Negative for nausea, vomiting, abdominal pain and diarrhea.  Genitourinary: Negative for dysuria and difficulty urinating.  Musculoskeletal: Negative for myalgias and joint swelling.  Skin: Negative for color change and rash.  Neurological: Negative for dizziness, light-headedness and headaches.  Psychiatric/Behavioral: Negative for dysphoric mood and agitation.       Objective:     Blood pressure rechecked by me:  130/68  Physical Exam  Constitutional: She appears well-developed and well-nourished. No distress.  HENT:  Nose: Nose normal.  Mouth/Throat: Oropharynx is clear and moist.  Neck: Neck supple. No thyromegaly present.  Cardiovascular: Normal rate and regular rhythm.   2/6 systolic murmur.  Bilateral carotid bruit.   Pulmonary/Chest: Breath sounds normal. No respiratory distress. She has no wheezes.  Abdominal: Soft. Bowel sounds are normal. There is no tenderness.  Musculoskeletal: She exhibits no edema or tenderness.  Lymphadenopathy:    She has no cervical adenopathy.  Skin: No rash noted. No erythema.  Psychiatric: She has a normal mood and affect. Her behavior is normal.    BP 132/60 mmHg  Pulse 75  Temp(Src) 97.8 F (36.6 C) (Oral)  Resp 17  Ht _0  (1.499 m)  Wt 141 lb (63.957 kg)  BMI 28.46 kg/m2  SpO2 93% Wt Readings from Last 3 Encounters:  04/19/16 141 lb (63.957 kg)  04/12/16 138 lb 12.8 oz (62.959 kg)  04/06/16 135 lb (61.236 kg)     Lab Results  Component Value Date   WBC 8.8 04/12/2016   HGB 12.0 04/12/2016   HCT 36.0 04/12/2016   PLT 278.0 04/12/2016   GLUCOSE 111* 04/12/2016   CHOL 190 01/16/2016   TRIG 229.0* 01/16/2016   HDL 45.60 01/16/2016   LDLDIRECT 107.0 01/16/2016   LDLCALC 85 09/15/2015   ALT 40 04/06/2016   AST 67* 04/06/2016   NA 140 04/12/2016   K 4.5 04/12/2016   CL 102 04/12/2016   CREATININE 1.02 04/12/2016   BUN 21 04/12/2016   CO2 33* 04/12/2016   TSH  2.39 05/18/2015   INR 1.0 01/10/2013   HGBA1C 7.9* 01/16/2016   MICROALBUR <0.7 05/18/2015    Dg Chest 2 View  04/06/2016  CLINICAL DATA:  Productive cough with green sputum, fever, nausea and weakness today, treated for URI last week, diabetes mellitus, hypertension, coronary artery disease, former smoker EXAM: CHEST  2 VIEW COMPARISON:  12/21/2015 FINDINGS: Normal heart size, mediastinal contours, and pulmonary vascularity. Atherosclerotic calcification aorta. Bronchitic and emphysematous changes question representing COPD. Lungs clear. No  definite infiltrate, pleural effusion or pneumothorax. Bones demineralized. IMPRESSION: Emphysematous and bronchitic changes question COPD. No acute abnormalities. Electronically Signed   By: Lavonia Dana M.D.   On: 04/06/2016 12:18       Assessment & Plan:   Problem List Items Addressed This Visit    Anemia - Primary    Recent cbc wnl.        Carotid arterial disease ([facility])    Due to f/u with vascular surgery in July.  Continue risk factor modification.  Continue daily aspirin.       Diabetes mellitus ([place])    Sugars as outlined and attached.  She is adjusting her diet.  Follow met b and a1c.       Relevant Orders   Hemoglobin K2V   Basic metabolic panel   Microalbumin / creatinine urine ratio   Hypercholesteremia    Low cholesterol diet and exercise.  On crestor.  Follow lipid panel and liver function tests.        Relevant Orders   Lipid panel   Hepatic function panel   Hypertension    Blood pressure doing well off hctz.  Remain off.  Recent sodium wnl.  Kidney function better.  Follow.       Relevant Orders   TSH       Einar Pheasant, MD

## 2016-05-23 ENCOUNTER — Telehealth: Payer: Self-pay

## 2016-05-23 ENCOUNTER — Inpatient Hospital Stay: Payer: Medicare Other | Attending: Internal Medicine

## 2016-05-23 DIAGNOSIS — D509 Iron deficiency anemia, unspecified: Secondary | ICD-10-CM | POA: Diagnosis not present

## 2016-05-23 DIAGNOSIS — Z79899 Other long term (current) drug therapy: Secondary | ICD-10-CM | POA: Insufficient documentation

## 2016-05-23 LAB — CBC WITH DIFFERENTIAL/PLATELET
BASOS ABS: 0.1 10*3/uL (ref 0–0.1)
Basophils Relative: 1 %
EOS ABS: 0.2 10*3/uL (ref 0–0.7)
Eosinophils Relative: 4 %
HCT: 37.8 % (ref 35.0–47.0)
HEMOGLOBIN: 12.8 g/dL (ref 12.0–16.0)
LYMPHS ABS: 1.7 10*3/uL (ref 1.0–3.6)
LYMPHS PCT: 32 %
MCH: 30.8 pg (ref 26.0–34.0)
MCHC: 33.8 g/dL (ref 32.0–36.0)
MCV: 91 fL (ref 80.0–100.0)
Monocytes Absolute: 0.3 10*3/uL (ref 0.2–0.9)
Monocytes Relative: 5 %
NEUTROS PCT: 58 %
Neutro Abs: 3.2 10*3/uL (ref 1.4–6.5)
Platelets: 171 10*3/uL (ref 150–440)
RBC: 4.16 MIL/uL (ref 3.80–5.20)
RDW: 13.8 % (ref 11.5–14.5)
WBC: 5.5 10*3/uL (ref 3.6–11.0)

## 2016-05-23 LAB — IRON AND TIBC
Iron: 53 ug/dL (ref 28–170)
SATURATION RATIOS: 13 % (ref 10.4–31.8)
TIBC: 413 ug/dL (ref 250–450)
UIBC: 360 ug/dL

## 2016-05-23 LAB — FERRITIN: FERRITIN: 18 ng/mL (ref 11–307)

## 2016-05-23 NOTE — Telephone Encounter (Signed)
Patient called to inquire whether she would need her iron infusion for 05/24/16.  Dr. Rogue Bussing wants to proceed with infusion.  Patient notified and provided her results.

## 2016-05-24 ENCOUNTER — Inpatient Hospital Stay: Payer: Medicare Other

## 2016-05-24 VITALS — BP 120/70 | HR 61 | Temp 96.0°F | Resp 18

## 2016-05-24 DIAGNOSIS — D508 Other iron deficiency anemias: Secondary | ICD-10-CM

## 2016-05-24 DIAGNOSIS — D509 Iron deficiency anemia, unspecified: Secondary | ICD-10-CM | POA: Diagnosis not present

## 2016-05-24 MED ORDER — SODIUM CHLORIDE 0.9 % IV SOLN
Freq: Once | INTRAVENOUS | Status: AC
  Administered 2016-05-24: 14:00:00 via INTRAVENOUS
  Filled 2016-05-24: qty 1000

## 2016-05-24 MED ORDER — SODIUM CHLORIDE 0.9 % IV SOLN
510.0000 mg | Freq: Once | INTRAVENOUS | Status: AC
  Administered 2016-05-24: 510 mg via INTRAVENOUS
  Filled 2016-05-24: qty 17

## 2016-05-25 NOTE — Telephone Encounter (Signed)
error 

## 2016-06-17 ENCOUNTER — Other Ambulatory Visit: Payer: Self-pay | Admitting: Internal Medicine

## 2016-06-18 NOTE — Telephone Encounter (Signed)
Received refill request electronically Last refill 01/18/16 #30/1 Last office visit 04/19/16 Okay to refill?

## 2016-06-25 ENCOUNTER — Other Ambulatory Visit (INDEPENDENT_AMBULATORY_CARE_PROVIDER_SITE_OTHER): Payer: Medicare Other

## 2016-06-25 DIAGNOSIS — E119 Type 2 diabetes mellitus without complications: Secondary | ICD-10-CM

## 2016-06-25 DIAGNOSIS — I10 Essential (primary) hypertension: Secondary | ICD-10-CM | POA: Diagnosis not present

## 2016-06-25 DIAGNOSIS — E7800 Pure hypercholesterolemia, unspecified: Secondary | ICD-10-CM

## 2016-06-25 LAB — HEPATIC FUNCTION PANEL
ALT: 31 U/L (ref 0–35)
AST: 30 U/L (ref 0–37)
Albumin: 4.1 g/dL (ref 3.5–5.2)
Alkaline Phosphatase: 49 U/L (ref 39–117)
BILIRUBIN TOTAL: 0.5 mg/dL (ref 0.2–1.2)
Bilirubin, Direct: 0.1 mg/dL (ref 0.0–0.3)
TOTAL PROTEIN: 6.9 g/dL (ref 6.0–8.3)

## 2016-06-25 LAB — BASIC METABOLIC PANEL
BUN: 16 mg/dL (ref 6–23)
CALCIUM: 9.7 mg/dL (ref 8.4–10.5)
CO2: 29 meq/L (ref 19–32)
CREATININE: 0.97 mg/dL (ref 0.40–1.20)
Chloride: 102 mEq/L (ref 96–112)
GFR: 58.43 mL/min — AB (ref >60.00)
Glucose, Bld: 143 mg/dL — ABNORMAL HIGH (ref 70–99)
Potassium: 4.3 mEq/L (ref 3.5–5.1)
Sodium: 140 mEq/L (ref 135–145)

## 2016-06-25 LAB — LIPID PANEL
CHOLESTEROL: 177 mg/dL (ref 0–200)
HDL: 50.8 mg/dL (ref >39.00)
NonHDL: 126.29
TRIGLYCERIDES: 227 mg/dL — AB (ref 0.0–149.0)
Total CHOL/HDL Ratio: 3
VLDL: 45.4 mg/dL — ABNORMAL HIGH (ref 0.0–40.0)

## 2016-06-25 LAB — LDL CHOLESTEROL, DIRECT: Direct LDL: 94 mg/dL

## 2016-06-25 LAB — MICROALBUMIN / CREATININE URINE RATIO
Creatinine,U: 52.2 mg/dL
MICROALB UR: 5.7 mg/dL — AB (ref 0.0–1.9)
Microalb Creat Ratio: 10.9 mg/g (ref 0.0–30.0)

## 2016-06-25 LAB — HEMOGLOBIN A1C: HEMOGLOBIN A1C: 6.7 % — AB (ref 4.6–6.5)

## 2016-06-25 LAB — TSH: TSH: 2.05 u[IU]/mL (ref 0.35–4.50)

## 2016-06-28 ENCOUNTER — Ambulatory Visit (INDEPENDENT_AMBULATORY_CARE_PROVIDER_SITE_OTHER): Payer: Medicare Other | Admitting: Internal Medicine

## 2016-06-28 ENCOUNTER — Encounter: Payer: Self-pay | Admitting: Internal Medicine

## 2016-06-28 DIAGNOSIS — I10 Essential (primary) hypertension: Secondary | ICD-10-CM

## 2016-06-28 DIAGNOSIS — I739 Peripheral vascular disease, unspecified: Secondary | ICD-10-CM

## 2016-06-28 DIAGNOSIS — F411 Generalized anxiety disorder: Secondary | ICD-10-CM

## 2016-06-28 DIAGNOSIS — E7800 Pure hypercholesterolemia, unspecified: Secondary | ICD-10-CM

## 2016-06-28 DIAGNOSIS — I779 Disorder of arteries and arterioles, unspecified: Secondary | ICD-10-CM | POA: Diagnosis not present

## 2016-06-28 DIAGNOSIS — E119 Type 2 diabetes mellitus without complications: Secondary | ICD-10-CM | POA: Diagnosis not present

## 2016-06-28 DIAGNOSIS — D649 Anemia, unspecified: Secondary | ICD-10-CM

## 2016-06-28 DIAGNOSIS — I2510 Atherosclerotic heart disease of native coronary artery without angina pectoris: Secondary | ICD-10-CM

## 2016-06-28 NOTE — Progress Notes (Signed)
Patient ID: Jocelyn Elliott, female   DOB: 1931/11/24, 82 y.o.   MRN: [id number]   Subjective:    Patient ID: Jocelyn Elliott, female    DOB: 1932-07-24, 82 y.o.   MRN: [id number]  HPI  Patient here for a scheduled follow up.  She is doing well.  Feels good.  Her sugars are doing better.  Brought in outside readings and her sugars are mostly averaging 120-160s.  No chest pain.  No sob.  No acid reflux.  No abdominal pain or cramping.  Bowels stable.  Unable to check her blood pressure.  Her machine broke.  Just saw Dr Elvina Mattes.  See note.  Doing well.  Overall feels good.    Past Medical History:  Diagnosis Date  . Arthritis   . AV malformation of gastrointestinal tract   . Blood in stool   . CAD (coronary artery disease)   . Carotid arterial disease ([place])   . Cataracts, bilateral   . Chronic blood loss anemia   . Chronic cystitis   . Depression   . Diabetes mellitus ([place])   . GERD (gastroesophageal reflux disease)   . Hyperlipidemia   . Hypertension   . IDA (iron deficiency anemia)   . Stress incontinence    Past Surgical History:  Procedure Laterality Date  . ABDOMINAL HYSTERECTOMY  1972   ovaries left in place  . APPENDECTOMY  1992  . BACK SURGERY  06/08/2010   L3, L4, L5  . CAROTID ARTERY ANGIOPLASTY  11/03  . [place], 92   right then left   . CHOLECYSTECTOMY  1993  . FOOT SURGERY  06/2007   Left  . right carotid artery surgery  01/09/13   Dr. Delana Meyer @ [facility]  . TOTAL HIP ARTHROPLASTY  05/03/2011   left 12, right 11/07   Family History  Problem Relation Age of Onset  . Hyperlipidemia Father   . Heart disease Father     myocardial infarction  . Hypertension Father     Parent  . Arthritis Other     parent  . Diabetes Other     nephew  . Cervical cancer Sister   . Rectal cancer Sister   . Breast cancer Neg Hx    Social History   Social History  . Marital status: Widowed    Spouse name: N/A  . Number of children: 1  . Years of  education: 7   Occupational History  . Retired    Social History Main Topics  . Smoking status: Former Smoker    Types: Cigarettes    Quit date: 11/19/1989  . Smokeless tobacco: Never Used  . Alcohol use No  . Drug use: No  . Sexual activity: Not Asked   Other Topics Concern  . None   Social History Narrative   Regular exercise-no   Caffeine Use-yes    Outpatient Encounter Prescriptions as of 06/28/2016  Medication Sig  . aspirin 81 MG tablet Take 81 mg by mouth daily.  . Biotin 5000 MCG CAPS Take 1 capsule by mouth daily.  . cetirizine (ZYRTEC) 10 MG tablet Take 10 mg by mouth daily.  . metFORMIN (GLUCOPHAGE-XR) 500 MG 24 hr tablet TAKE 1 TABLET TWICE A DAY (Patient taking differently: TAKE 1 TABLET QD)  . metoprolol succinate (TOPROL-XL) 100 MG 24 hr tablet TAKE 1 TABLET TWICE A DAY  . Omega-3 Fatty Acids (FISH OIL) 1200 MG CAPS Take 1 capsule by mouth daily.  Marland Kitchen omeprazole (PRILOSEC)  20 MG capsule Take 20 mg by mouth daily.  . ramipril (ALTACE) 10 MG capsule Take 1 capsule (10 mg total) by mouth daily.  . rosuvastatin (CRESTOR) 20 MG tablet Take 1 tablet (20 mg total) by mouth daily.  . traZODone (DESYREL) 50 MG tablet TAKE 0.5-1 TABLETS (25-50 MG TOTAL) BY MOUTH AT BEDTIME AS NEEDED FOR SLEEP.   No facility-administered encounter medications on file as of 06/28/2016.     Review of Systems  Constitutional: Negative for appetite change and unexpected weight change.  HENT: Negative for congestion and sinus pressure.   Respiratory: Negative for cough, chest tightness and shortness of breath.   Cardiovascular: Negative for chest pain, palpitations and leg swelling.  Gastrointestinal: Negative for abdominal pain, diarrhea, nausea and vomiting.  Genitourinary: Negative for difficulty urinating and dysuria.  Musculoskeletal: Negative for back pain and joint swelling.  Skin: Negative for color change and rash.  Neurological: Negative for dizziness, light-headedness and  headaches.  Psychiatric/Behavioral: Negative for agitation and dysphoric mood.       Objective:     Blood pressure rechecked by me:  136/78  Physical Exam  Constitutional: She appears well-developed and well-nourished. No distress.  HENT:  Nose: Nose normal.  Mouth/Throat: Oropharynx is clear and moist.  Neck: Neck supple. No thyromegaly present.  Cardiovascular: Normal rate and regular rhythm.   Pulmonary/Chest: Breath sounds normal. No respiratory distress. She has no wheezes.  Abdominal: Soft. Bowel sounds are normal. There is no tenderness.  Musculoskeletal: She exhibits no edema or tenderness.  Lymphadenopathy:    She has no cervical adenopathy.  Skin: No rash noted. No erythema.  Psychiatric: She has a normal mood and affect. Her behavior is normal.    BP 136/78   Pulse 70   Temp 98.4 F (36.9 C) (Oral)   Ht _0  (1.499 m)   Wt 139 lb 3.2 oz (63.1 kg)   SpO2 94%   BMI 28.11 kg/m  Wt Readings from Last 3 Encounters:  06/28/16 139 lb 3.2 oz (63.1 kg)  04/19/16 141 lb (64 kg)  04/12/16 138 lb 12.8 oz (63 kg)     Lab Results  Component Value Date   WBC 5.5 05/23/2016   HGB 12.8 05/23/2016   HCT 37.8 05/23/2016   PLT 171 05/23/2016   GLUCOSE 143 (H) 06/25/2016   CHOL 177 06/25/2016   TRIG 227.0 (H) 06/25/2016   HDL 50.80 06/25/2016   LDLDIRECT 94.0 06/25/2016   LDLCALC 85 09/15/2015   ALT 31 06/25/2016   AST 30 06/25/2016   NA 140 06/25/2016   K 4.3 06/25/2016   CL 102 06/25/2016   CREATININE 0.97 06/25/2016   BUN 16 06/25/2016   CO2 29 06/25/2016   TSH 2.05 06/25/2016   INR 1.0 01/10/2013   HGBA1C 6.7 (H) 06/25/2016   MICROALBUR 5.7 (H) 06/25/2016    Dg Chest 2 View  Result Date: 04/06/2016 CLINICAL DATA:  Productive cough with green sputum, fever, nausea and weakness today, treated for URI last week, diabetes mellitus, hypertension, coronary artery disease, former smoker EXAM: CHEST  2 VIEW COMPARISON:  12/21/2015 FINDINGS: Normal heart size,  mediastinal contours, and pulmonary vascularity. Atherosclerotic calcification aorta. Bronchitic and emphysematous changes question representing COPD. Lungs clear. No definite infiltrate, pleural effusion or pneumothorax. Bones demineralized. IMPRESSION: Emphysematous and bronchitic changes question COPD. No acute abnormalities. Electronically Signed   By: Lavonia Dana M.D.   On: 04/06/2016 12:18       Assessment & Plan:   Problem List  Items Addressed This Visit    Anemia    Followed by hematology.  Last hgb 12.8.        CAD (coronary artery disease)    Followed by cardiology.  Stable.  Continue risk factor modification.       Carotid arterial disease ([place])    Sees vascular surgery.  Continue risk factor modification.  Continue daily aspirin.       Diabetes mellitus ([place])    Sugars as outlined.  Doing better.  Continue current medication regimen.  Follow sugars.  Low carb diet.  Follow met b and a1c.       Relevant Orders   Hemoglobin X8P   Basic metabolic panel   Generalized anxiety disorder    Doing well.  Follow.       Hypercholesteremia    On crestor.  Low cholesterol diet and exercise.  Follow lipid panel and liver function tests.   Lab Results  Component Value Date   CHOL 177 06/25/2016   HDL 50.80 06/25/2016   LDLCALC 85 09/15/2015   LDLDIRECT 94.0 06/25/2016   TRIG 227.0 (H) 06/25/2016   CHOLHDL 3 06/25/2016        Relevant Orders   Lipid panel   Hepatic function panel   Hypertension    Blood pressure improved on my check.  Has been doing well.  Follow pressures.  Continue same medication regimen.  Follow.        Other Visit Diagnoses   None.      Einar Pheasant, MD

## 2016-06-28 NOTE — Progress Notes (Signed)
Pre-visit discussion using our clinic review tool. No additional management support is needed unless otherwise documented below in the visit note.  

## 2016-07-01 ENCOUNTER — Encounter: Payer: Self-pay | Admitting: Internal Medicine

## 2016-07-01 NOTE — Assessment & Plan Note (Signed)
On crestor.  Low cholesterol diet and exercise.  Follow lipid panel and liver function tests.   Lab Results  Component Value Date   CHOL 177 06/25/2016   HDL 50.80 06/25/2016   LDLCALC 85 09/15/2015   LDLDIRECT 94.0 06/25/2016   TRIG 227.0 (H) 06/25/2016   CHOLHDL 3 06/25/2016

## 2016-07-01 NOTE — Assessment & Plan Note (Signed)
Doing well.  Follow.  

## 2016-07-01 NOTE — Assessment & Plan Note (Signed)
Blood pressure improved on my check.  Has been doing well.  Follow pressures.  Continue same medication regimen.  Follow.

## 2016-07-01 NOTE — Assessment & Plan Note (Signed)
Followed by cardiology.  Stable.  Continue risk factor modification.   

## 2016-07-01 NOTE — Assessment & Plan Note (Signed)
Followed by hematology.  Last hgb 12.8.

## 2016-07-01 NOTE — Assessment & Plan Note (Signed)
Sugars as outlined.  Doing better.  Continue current medication regimen.  Follow sugars.  Low carb diet.  Follow met b and a1c.

## 2016-07-01 NOTE — Assessment & Plan Note (Signed)
Sees vascular surgery.  Continue risk factor modification.  Continue daily aspirin.

## 2016-07-02 IMAGING — MG MM DIGITAL SCREENING BILAT W/ TOMO W/ CAD
9 of 16 series · 9 of 36 positions shown · non-contrast
Comparison: Previous exam(s).

CLINICAL DATA: Screening.

EXAM:
DIGITAL SCREENING BILATERAL MAMMOGRAM WITH 3D TOMO WITH CAD

[R MLO]
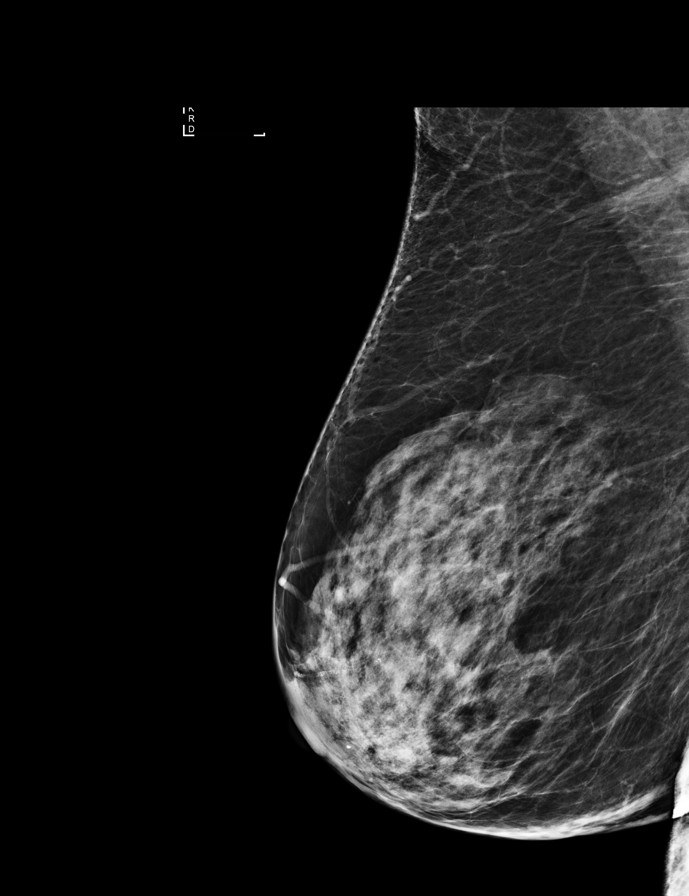

[R XCCL]
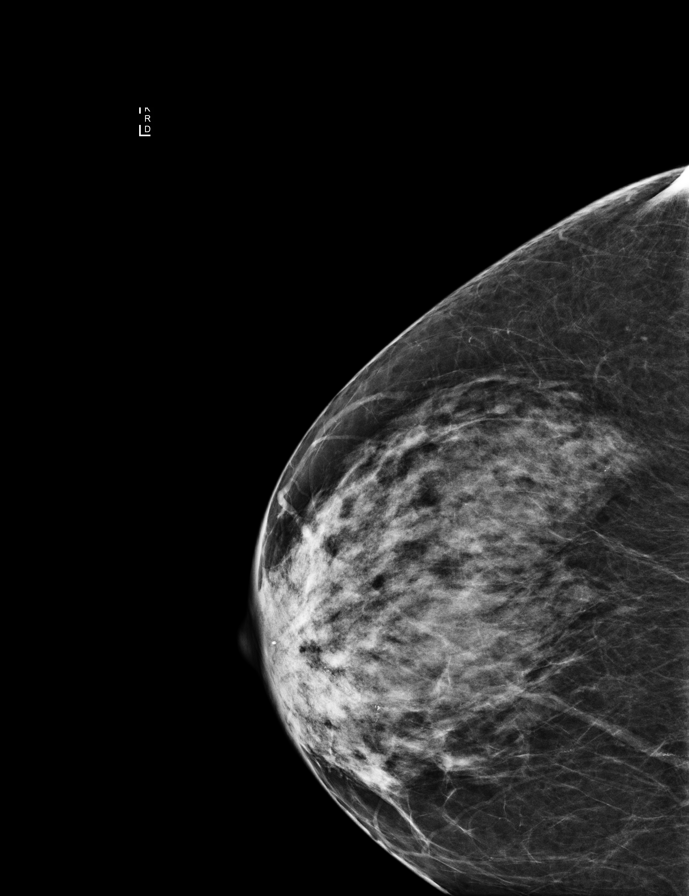

[L CC]
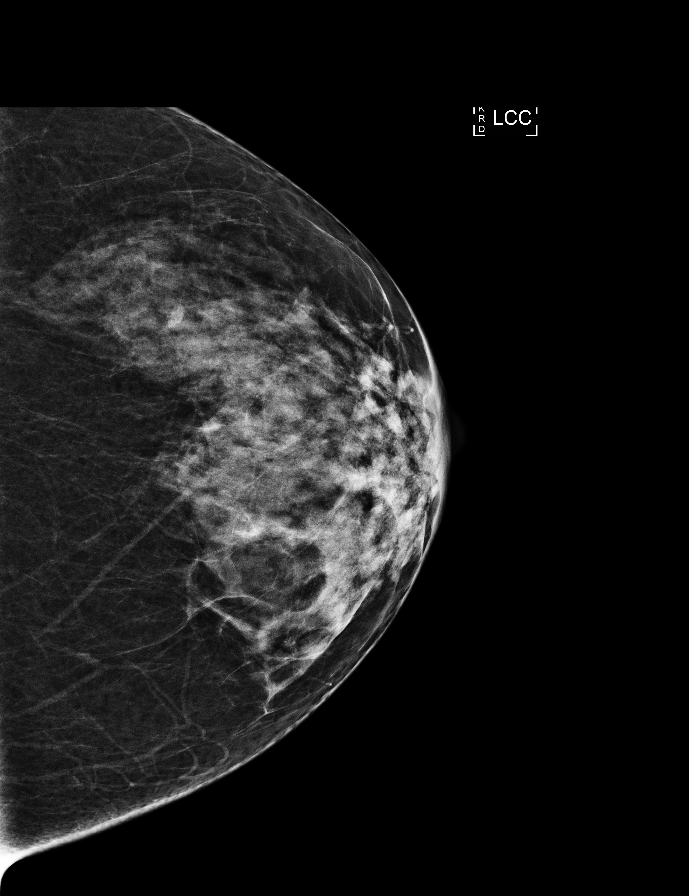

[R CC]
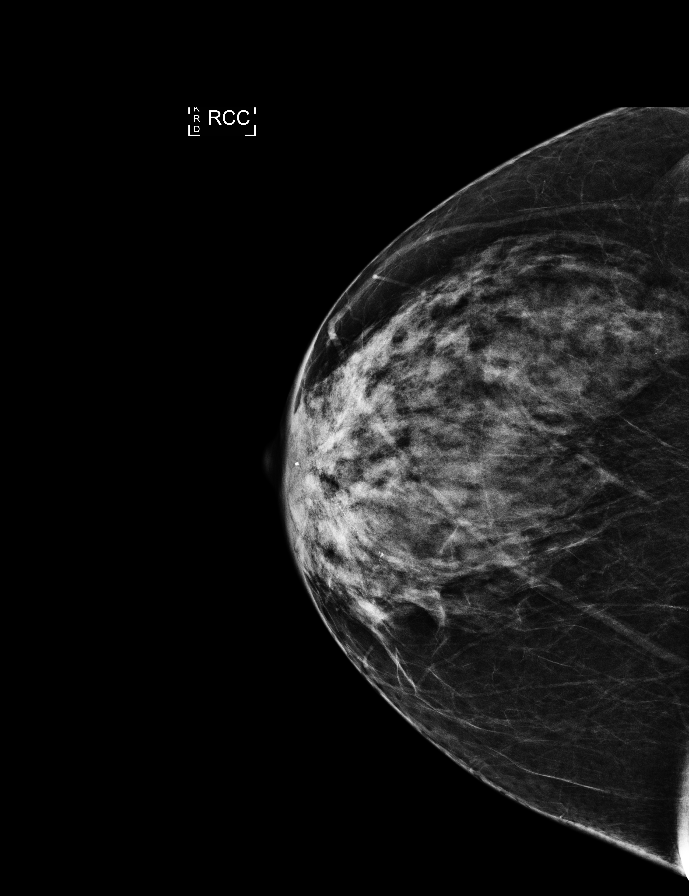

[R MLO synth-2D]
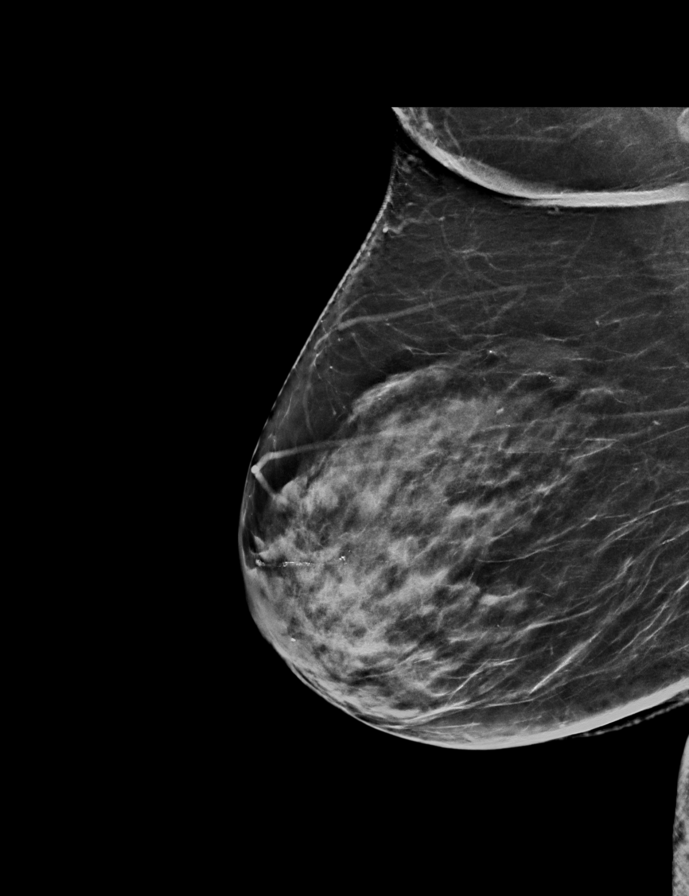

[R XCCL synth-2D]
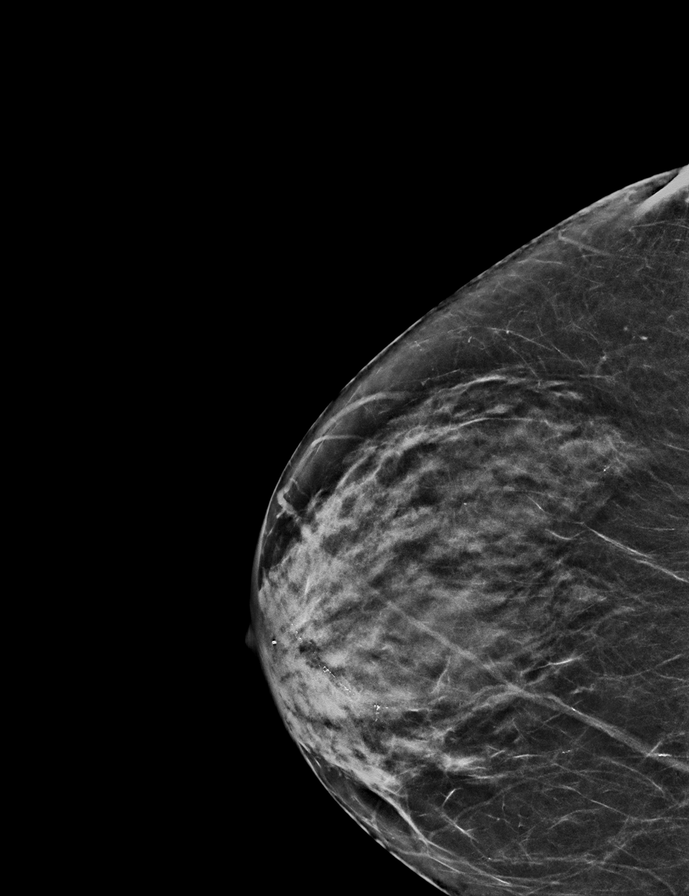

[R CC synth-2D]
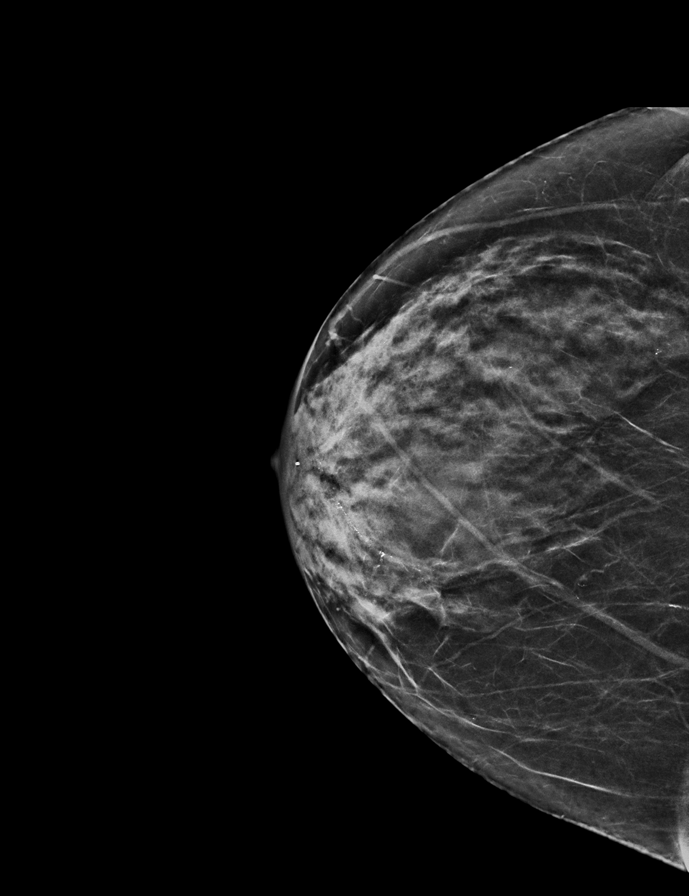

[L MLO synth-2D]
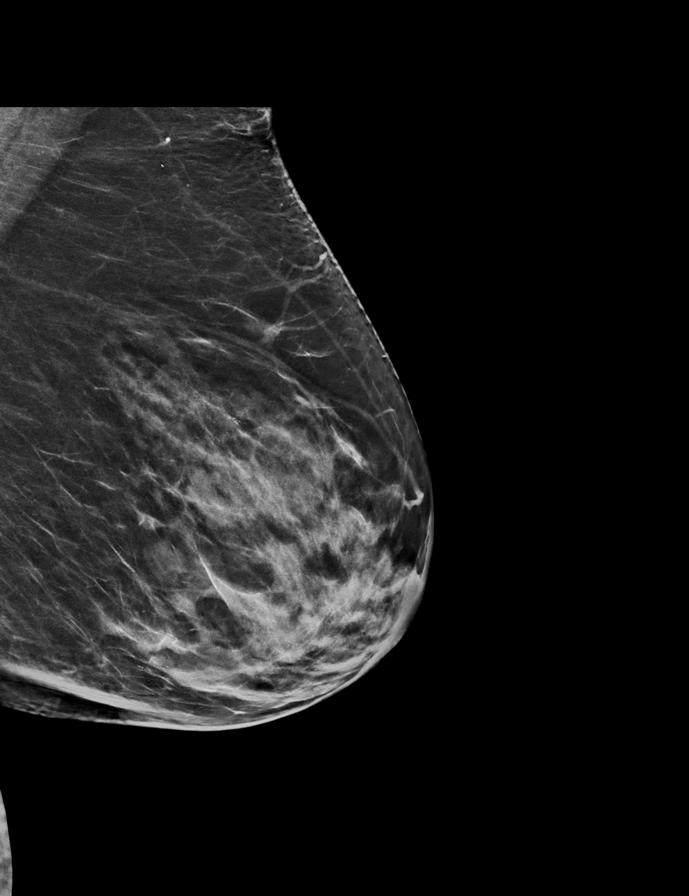

[L CC synth-2D]
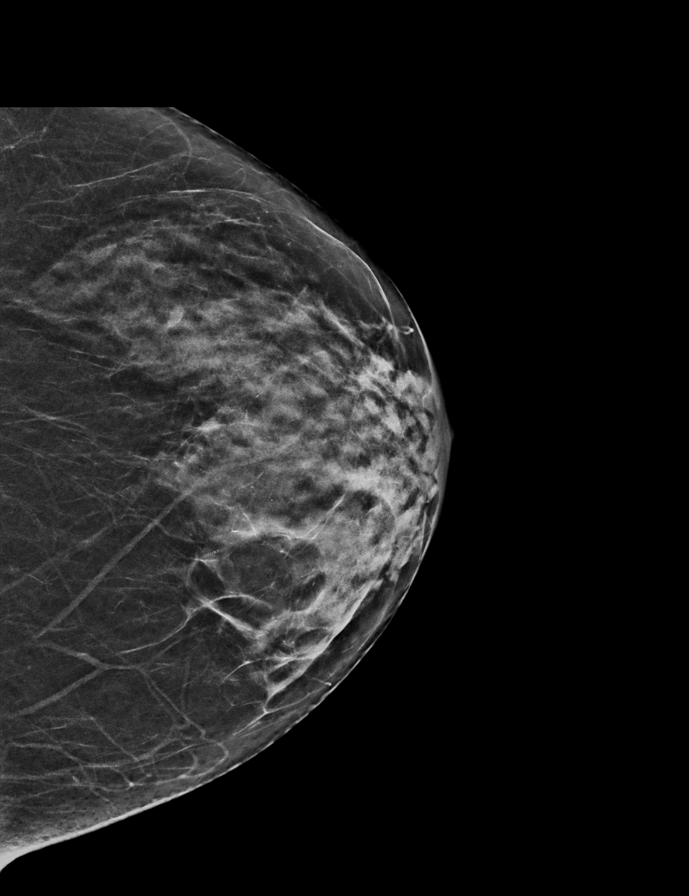

[9 of 36 positions shown; findings below may reference images not displayed]

ACR Breast Density Category c: The breast tissue is heterogeneously
dense, which may obscure small masses.
FINDINGS: There are no findings suspicious for malignancy. Images were
processed with CAD.
IMPRESSION: No mammographic evidence of malignancy. A result letter of this
screening mammogram will be mailed directly to the patient.

RECOMMENDATION:
Screening mammogram in one year. (Code:[id number])

BI-RADS CATEGORY  1: Negative.

## 2016-07-27 ENCOUNTER — Telehealth: Payer: Self-pay | Admitting: Internal Medicine

## 2016-07-27 NOTE — Telephone Encounter (Signed)
Form placed in blue folder.

## 2016-07-27 NOTE — Telephone Encounter (Signed)
Pt dropped off handicapp renewal for to be completed by Dr. Nicki Reaper. Paper is up front in colored folder.

## 2016-07-30 NOTE — Telephone Encounter (Signed)
Patient notified form is ready

## 2016-08-21 ENCOUNTER — Other Ambulatory Visit: Payer: Self-pay

## 2016-08-21 MED ORDER — TRAZODONE HCL 50 MG PO TABS: 25.0000 mg | ORAL_TABLET | Freq: Every evening | ORAL | 0 refills | Status: DC | PRN

## 2016-08-21 NOTE — Progress Notes (Signed)
Rx request for 90 days, approved

## 2016-09-11 ENCOUNTER — Other Ambulatory Visit: Payer: Self-pay | Admitting: Internal Medicine

## 2016-09-19 ENCOUNTER — Inpatient Hospital Stay: Payer: Medicare Other

## 2016-09-19 DIAGNOSIS — I10 Essential (primary) hypertension: Secondary | ICD-10-CM | POA: Diagnosis not present

## 2016-09-19 DIAGNOSIS — Z79899 Other long term (current) drug therapy: Secondary | ICD-10-CM | POA: Diagnosis not present

## 2016-09-19 DIAGNOSIS — K921 Melena: Secondary | ICD-10-CM | POA: Diagnosis not present

## 2016-09-19 DIAGNOSIS — E119 Type 2 diabetes mellitus without complications: Secondary | ICD-10-CM | POA: Diagnosis not present

## 2016-09-19 DIAGNOSIS — I2510 Atherosclerotic heart disease of native coronary artery without angina pectoris: Secondary | ICD-10-CM | POA: Insufficient documentation

## 2016-09-19 DIAGNOSIS — K219 Gastro-esophageal reflux disease without esophagitis: Secondary | ICD-10-CM | POA: Diagnosis not present

## 2016-09-19 DIAGNOSIS — M1990 Unspecified osteoarthritis, unspecified site: Secondary | ICD-10-CM | POA: Diagnosis not present

## 2016-09-19 DIAGNOSIS — Q2733 Arteriovenous malformation of digestive system vessel: Secondary | ICD-10-CM | POA: Insufficient documentation

## 2016-09-19 DIAGNOSIS — D509 Iron deficiency anemia, unspecified: Secondary | ICD-10-CM

## 2016-09-19 DIAGNOSIS — D500 Iron deficiency anemia secondary to blood loss (chronic): Secondary | ICD-10-CM | POA: Diagnosis present

## 2016-09-19 DIAGNOSIS — Z87891 Personal history of nicotine dependence: Secondary | ICD-10-CM | POA: Insufficient documentation

## 2016-09-19 LAB — CBC WITH DIFFERENTIAL/PLATELET
Basophils Absolute: 0.1 10*3/uL (ref 0–0.1)
Basophils Relative: 1 %
EOS ABS: 0.2 10*3/uL (ref 0–0.7)
EOS PCT: 4 %
HCT: 38.4 % (ref 35.0–47.0)
Hemoglobin: 13 g/dL (ref 12.0–16.0)
LYMPHS ABS: 1.9 10*3/uL (ref 1.0–3.6)
LYMPHS PCT: 31 %
MCH: 31 pg (ref 26.0–34.0)
MCHC: 33.8 g/dL (ref 32.0–36.0)
MCV: 91.7 fL (ref 80.0–100.0)
MONOS PCT: 5 %
Monocytes Absolute: 0.3 10*3/uL (ref 0.2–0.9)
Neutro Abs: 3.6 10*3/uL (ref 1.4–6.5)
Neutrophils Relative %: 59 %
PLATELETS: 167 10*3/uL (ref 150–440)
RBC: 4.19 MIL/uL (ref 3.80–5.20)
RDW: 13.5 % (ref 11.5–14.5)
WBC: 6.1 10*3/uL (ref 3.6–11.0)

## 2016-09-19 LAB — FERRITIN: FERRITIN: 46 ng/mL (ref 11–307)

## 2016-09-19 LAB — IRON AND TIBC
Iron: 62 ug/dL (ref 28–170)
Saturation Ratios: 17 % (ref 10.4–31.8)
TIBC: 363 ug/dL (ref 250–450)
UIBC: 301 ug/dL

## 2016-09-20 ENCOUNTER — Inpatient Hospital Stay: Payer: Medicare Other

## 2016-09-20 ENCOUNTER — Inpatient Hospital Stay: Payer: Medicare Other | Attending: Internal Medicine | Admitting: Internal Medicine

## 2016-09-20 DIAGNOSIS — D500 Iron deficiency anemia secondary to blood loss (chronic): Secondary | ICD-10-CM | POA: Diagnosis not present

## 2016-09-20 DIAGNOSIS — Z79899 Other long term (current) drug therapy: Secondary | ICD-10-CM

## 2016-09-20 DIAGNOSIS — Q2733 Arteriovenous malformation of digestive system vessel: Secondary | ICD-10-CM | POA: Diagnosis not present

## 2016-09-20 DIAGNOSIS — K921 Melena: Secondary | ICD-10-CM

## 2016-09-20 DIAGNOSIS — I10 Essential (primary) hypertension: Secondary | ICD-10-CM

## 2016-09-20 DIAGNOSIS — D509 Iron deficiency anemia, unspecified: Secondary | ICD-10-CM | POA: Insufficient documentation

## 2016-09-20 NOTE — Progress Notes (Signed)
Patient is here for follow up, she has no complaints

## 2016-09-20 NOTE — Progress Notes (Signed)
[facility] OFFICE PROGRESS NOTE  Patient Care Team: Einar Pheasant, MD as PCP - General (Internal Medicine) Einar Pheasant, MD (Internal Medicine) Leia Alf, MD (Inactive) as Referring Physician (Internal Medicine) Isaias Cowman, MD (Internal Medicine) Albesa Seen, MD (Unknown Physician Specialty) Philis Kendall, MD (Unknown Physician Specialty) Brendolyn Patty, MD (Specialist) Katha Cabal, MD (Vascular Surgery) Leia Alf, MD (Inactive) as Referring Physician (Internal Medicine) Isaias Cowman, MD (Internal Medicine) Albesa Seen, MD (Unknown Physician Specialty) Philis Kendall, MD (Unknown Physician Specialty) Brendolyn Patty, MD (Specialist) Katha Cabal, MD (Vascular Surgery)   SUMMARY OF HEMATOLOGIC HISTORY:  # IRON DEFICIENCY ANEMIA- recurrent [? GI blood loss; Dr.Elliot]; IV Ferrahem q 57m   INTERVAL HISTORY:   82 year old female patient with above history of recurrent iron deficiency anemia needing IV iron;  Last September 2015 is here for follow-up.  Patient's last IV iron was in July 2017.   Denies any significant fatigue. Admits to elevated blood pressure the last few weeks. Patient denies any blood in stools black colored stools. No weight loss. No chest pain or shortness of breath or cough. No swelling in the legs. Patient has poor tolerance to by mouth iron/nausea.   REVIEW OF SYSTEMS:  A complete 10 point review of system is done which is negative except mentioned above/history of present illness.   PAST MEDICAL HISTORY :  Past Medical History:  Diagnosis Date  . Arthritis   . AV malformation of gastrointestinal tract   . Blood in stool   . CAD (coronary artery disease)   . Carotid arterial disease ([place])   . Cataracts, bilateral   . Chronic blood loss anemia   . Chronic cystitis   . Depression   . Diabetes mellitus ([place])   . GERD (gastroesophageal reflux disease)   . Hyperlipidemia   . Hypertension   . IDA (iron  deficiency anemia)   . Stress incontinence     PAST SURGICAL HISTORY :   Past Surgical History:  Procedure Laterality Date  . ABDOMINAL HYSTERECTOMY  1972   ovaries left in place  . APPENDECTOMY  1992  . BACK SURGERY  06/08/2010   L3, L4, L5  . CAROTID ARTERY ANGIOPLASTY  11/03  . [place], 92   right then left   . CHOLECYSTECTOMY  1993  . FOOT SURGERY  06/2007   Left  . right carotid artery surgery  01/09/13   Dr. Delana Meyer @ [facility]  . TOTAL HIP ARTHROPLASTY  05/03/2011   left 12, right 11/07    FAMILY HISTORY :   Family History  Problem Relation Age of Onset  . Hyperlipidemia Father   . Heart disease Father     myocardial infarction  . Hypertension Father     Parent  . Arthritis Other     parent  . Diabetes Other     nephew  . Cervical cancer Sister   . Rectal cancer Sister   . Breast cancer Neg Hx     SOCIAL HISTORY:   Social History  Substance Use Topics  . Smoking status: Former Smoker    Types: Cigarettes    Quit date: 11/19/1989  . Smokeless tobacco: Never Used  . Alcohol use No    ALLERGIES:  is allergic to ciprofloxacin; levaquin [levofloxacin]; and tequin [gatifloxacin].  MEDICATIONS:  Current Outpatient Prescriptions  Medication Sig Dispense Refill  . aspirin 81 MG tablet Take 81 mg by mouth daily.    . Biotin 5000 MCG CAPS Take 1  capsule by mouth daily.    . cetirizine (ZYRTEC) 10 MG tablet Take 10 mg by mouth daily.    . metFORMIN (GLUCOPHAGE-XR) 500 MG 24 hr tablet TAKE 1 TABLET TWICE A DAY (Patient taking differently: TAKE 1 TABLET QD) 180 tablet 3  . metoprolol succinate (TOPROL-XL) 100 MG 24 hr tablet TAKE 1 TABLET TWICE A DAY 180 tablet 3  . Omega-3 Fatty Acids (FISH OIL) 1200 MG CAPS Take 1 capsule by mouth daily.    Marland Kitchen omeprazole (PRILOSEC) 20 MG capsule Take 20 mg by mouth daily.    . ramipril (ALTACE) 10 MG capsule Take 1 capsule (10 mg total) by mouth daily. 90 capsule 3  . rosuvastatin (CRESTOR) 20 MG tablet TAKE 1  TABLET (20 MG TOTAL) BY MOUTH DAILY. 90 tablet 1  . traZODone (DESYREL) 50 MG tablet Take 0.5-1 tablets (25-50 mg total) by mouth at bedtime as needed for sleep. 90 tablet 0   No current facility-administered medications for this visit.     PHYSICAL EXAMINATION:   BP (!) 175/77 (BP Location: Left Arm, Patient Position: Sitting)   Pulse 71   Temp (!) 96.4 F (35.8 C) (Tympanic)   Resp 18   Wt 140 lb (63.5 kg)   BMI 28.28 kg/m   Filed Weights   09/20/16 1419  Weight: 140 lb (63.5 kg)    GENERAL: Well-nourished well-developed; Alert, no distress and comfortable.  Alone.  EYES: no pallor or icterus OROPHARYNX: no thrush or ulceration; good dentition  NECK: supple, no masses felt LYMPH:  no palpable lymphadenopathy in the cervical, axillary or inguinal regions LUNGS: clear to auscultation and  No wheeze or crackles HEART/CVS: regular rate & rhythm and no murmurs; No lower extremity edema ABDOMEN:abdomen soft, non-tender and normal bowel sounds Musculoskeletal:no cyanosis of digits and no clubbing  PSYCH: alert & oriented x 3 with fluent speech NEURO: no focal motor/sensory deficits SKIN:  no rashes or significant lesions  LABORATORY DATA:  I have reviewed the data as listed    Component Value Date/Time   NA 140 06/25/2016 0945   NA 143 01/10/2013 0314   K 4.3 06/25/2016 0945   K 4.0 01/10/2013 0314   CL 102 06/25/2016 0945   CL 109 (H) 01/10/2013 0314   CO2 29 06/25/2016 0945   CO2 27 01/10/2013 0314   GLUCOSE 143 (H) 06/25/2016 0945   GLUCOSE 119 (H) 01/10/2013 0314   BUN 16 06/25/2016 0945   BUN 15 01/10/2013 0314   CREATININE 0.97 06/25/2016 0945   CREATININE 1.03 01/10/2013 0314   CREATININE 1.13 (H) 09/18/2012 0848   CALCIUM 9.7 06/25/2016 0945   CALCIUM 8.1 (L) 01/10/2013 0314   PROT 6.9 06/25/2016 0945   PROT 7.2 12/20/2011 1521   ALBUMIN 4.1 06/25/2016 0945   ALBUMIN 3.9 12/20/2011 1521   AST 30 06/25/2016 0945   AST 31 12/20/2011 1521   ALT 31  06/25/2016 0945   ALT 37 12/20/2011 1521   ALKPHOS 49 06/25/2016 0945   ALKPHOS 57 12/20/2011 1521   BILITOT 0.5 06/25/2016 0945   BILITOT 0.5 12/20/2011 1521   GFRNONAA 28 (L) 04/06/2016 1526   GFRNONAA 52 (L) 01/10/2013 0314   GFRNONAA 47 (L) 09/18/2012 0848   GFRAA 32 (L) 04/06/2016 1526   GFRAA >60 01/10/2013 0314   GFRAA 54 (L) 09/18/2012 0848    No results found for: SPEP, UPEP  Lab Results  Component Value Date   WBC 6.1 09/19/2016   NEUTROABS 3.6 09/19/2016   HGB  13.0 09/19/2016   HCT 38.4 09/19/2016   MCV 91.7 09/19/2016   PLT 167 09/19/2016      Chemistry      Component Value Date/Time   NA 140 06/25/2016 0945   NA 143 01/10/2013 0314   K 4.3 06/25/2016 0945   K 4.0 01/10/2013 0314   CL 102 06/25/2016 0945   CL 109 (H) 01/10/2013 0314   CO2 29 06/25/2016 0945   CO2 27 01/10/2013 0314   BUN 16 06/25/2016 0945   BUN 15 01/10/2013 0314   CREATININE 0.97 06/25/2016 0945   CREATININE 1.03 01/10/2013 0314   CREATININE 1.13 (H) 09/18/2012 0848      Component Value Date/Time   CALCIUM 9.7 06/25/2016 0945   CALCIUM 8.1 (L) 01/10/2013 0314   ALKPHOS 49 06/25/2016 0945   ALKPHOS 57 12/20/2011 1521   AST 30 06/25/2016 0945   AST 31 12/20/2011 1521   ALT 31 06/25/2016 0945   ALT 37 12/20/2011 1521   BILITOT 0.5 06/25/2016 0945   BILITOT 0.5 12/20/2011 1521         ASSESSMENT & PLAN:   Iron deficiency anemia due to chronic blood loss # -Recurrent iron deficiency anemia- patient last received IV iron Feraheme end of July 2017. Today hemoglobin is 13.5 MCV is 89. Ferritin iron studies- Normal.  # Elevated Blood pressure- recommend checking blood pressure at home; and then report to PCP if elevated.   # patient will follow-up with labs/possible IV iron in 6 months; and again with me with labs possible IV iron 6 months.      Cammie Sickle, MD 09/20/2016 5:29 PM

## 2016-09-20 NOTE — Assessment & Plan Note (Addendum)
# -  Recurrent iron deficiency anemia- patient last received IV iron Feraheme end of July 2017. Today hemoglobin is 13.5 MCV is 89. Ferritin iron studies- Normal.  # Elevated Blood pressure- recommend checking blood pressure at home; and then report to PCP if elevated.   # patient will follow-up with labs/possible IV iron in 6 months; and again with me with labs possible IV iron 6 months.

## 2016-09-25 ENCOUNTER — Other Ambulatory Visit: Payer: Self-pay | Admitting: Internal Medicine

## 2016-09-25 DIAGNOSIS — Z1231 Encounter for screening mammogram for malignant neoplasm of breast: Secondary | ICD-10-CM

## 2016-10-29 ENCOUNTER — Ambulatory Visit
Admission: RE | Admit: 2016-10-29 | Discharge: 2016-10-29 | Disposition: A | Discharge status: Home / Self Care | Payer: Medicare Other | Source: Ambulatory Visit | Attending: Internal Medicine | Admitting: Internal Medicine

## 2016-10-29 DIAGNOSIS — Z1231 Encounter for screening mammogram for malignant neoplasm of breast: Secondary | ICD-10-CM

## 2016-11-05 ENCOUNTER — Other Ambulatory Visit (INDEPENDENT_AMBULATORY_CARE_PROVIDER_SITE_OTHER): Payer: Medicare Other

## 2016-11-05 DIAGNOSIS — E119 Type 2 diabetes mellitus without complications: Secondary | ICD-10-CM | POA: Diagnosis not present

## 2016-11-05 DIAGNOSIS — E7800 Pure hypercholesterolemia, unspecified: Secondary | ICD-10-CM

## 2016-11-05 LAB — HEMOGLOBIN A1C: Hgb A1c MFr Bld: 7.3 % — ABNORMAL HIGH (ref 4.6–6.5)

## 2016-11-05 LAB — BASIC METABOLIC PANEL
BUN: 17 mg/dL (ref 6–23)
CALCIUM: 9.5 mg/dL (ref 8.4–10.5)
CO2: 31 mEq/L (ref 19–32)
Chloride: 103 mEq/L (ref 96–112)
Creatinine, Ser: 0.95 mg/dL (ref 0.40–1.20)
GFR: 59.8 mL/min — AB (ref >60.00)
GLUCOSE: 141 mg/dL — AB (ref 70–99)
POTASSIUM: 4.5 meq/L (ref 3.5–5.1)
SODIUM: 141 meq/L (ref 135–145)

## 2016-11-05 LAB — HEPATIC FUNCTION PANEL
ALBUMIN: 4.1 g/dL (ref 3.5–5.2)
ALK PHOS: 62 U/L (ref 39–117)
ALT: 31 U/L (ref 0–35)
AST: 30 U/L (ref 0–37)
BILIRUBIN DIRECT: 0.1 mg/dL (ref 0.0–0.3)
TOTAL PROTEIN: 6.6 g/dL (ref 6.0–8.3)
Total Bilirubin: 0.6 mg/dL (ref 0.2–1.2)

## 2016-11-05 LAB — LDL CHOLESTEROL, DIRECT: LDL DIRECT: 90 mg/dL

## 2016-11-05 LAB — LIPID PANEL
CHOL/HDL RATIO: 4
CHOLESTEROL: 172 mg/dL (ref 0–200)
HDL: 49.1 mg/dL (ref >39.00)
NonHDL: 123.15
TRIGLYCERIDES: 209 mg/dL — AB (ref 0.0–149.0)
VLDL: 41.8 mg/dL — AB (ref 0.0–40.0)

## 2016-11-08 ENCOUNTER — Encounter: Payer: Self-pay | Admitting: Internal Medicine

## 2016-11-08 ENCOUNTER — Ambulatory Visit (INDEPENDENT_AMBULATORY_CARE_PROVIDER_SITE_OTHER): Payer: Medicare Other | Admitting: Internal Medicine

## 2016-11-08 ENCOUNTER — Ambulatory Visit: Payer: Medicare Other

## 2016-11-08 VITALS — BP 122/78 | HR 83 | Temp 97.5°F | Ht <= 58 in | Wt 141.2 lb

## 2016-11-08 DIAGNOSIS — I779 Disorder of arteries and arterioles, unspecified: Secondary | ICD-10-CM

## 2016-11-08 DIAGNOSIS — I10 Essential (primary) hypertension: Secondary | ICD-10-CM

## 2016-11-08 DIAGNOSIS — Z0000 Encounter for general adult medical examination without abnormal findings: Secondary | ICD-10-CM

## 2016-11-08 DIAGNOSIS — E7800 Pure hypercholesterolemia, unspecified: Secondary | ICD-10-CM

## 2016-11-08 DIAGNOSIS — E119 Type 2 diabetes mellitus without complications: Secondary | ICD-10-CM

## 2016-11-08 DIAGNOSIS — Z23 Encounter for immunization: Secondary | ICD-10-CM

## 2016-11-08 DIAGNOSIS — I2510 Atherosclerotic heart disease of native coronary artery without angina pectoris: Secondary | ICD-10-CM

## 2016-11-08 DIAGNOSIS — I739 Peripheral vascular disease, unspecified: Secondary | ICD-10-CM

## 2016-11-08 DIAGNOSIS — D500 Iron deficiency anemia secondary to blood loss (chronic): Secondary | ICD-10-CM

## 2016-11-08 NOTE — Patient Instructions (Addendum)
  Ms. Joos , Thank you for taking time to come for your Medicare Wellness Visit. I appreciate your ongoing commitment to your health goals. Please review the following plan we discussed and let me know if I can assist you in the future.   Merry Christmas and Happy New Year!  These are the goals we discussed: Goals    . Increase physical activity       This is a list of the screening recommended for you and due dates:  Health Maintenance  Topic Date Due  . Tetanus Vaccine  06/12/1951  . Shingles Vaccine  06/12/1994  . DEXA scan (bone density measurement)  06/13/1999  . Pneumonia vaccines (2 of 2 - PCV13) 09/23/2008  . Complete foot exam   10/06/2014  . Eye exam for diabetics  10/27/2016  . Hemoglobin A1C  05/06/2017  . Mammogram  10/29/2017  . Flu Shot  Completed

## 2016-11-08 NOTE — Progress Notes (Signed)
Pre visit review using our clinic review tool, if applicable. No additional management support is needed unless otherwise documented below in the visit note. 

## 2016-11-08 NOTE — Progress Notes (Addendum)
Subjective:   Jocelyn Elliott is a 82 y.o. female who presents for an Initial Medicare Annual Wellness Visit.  Review of Systems    No ROS.  Medicare Wellness Visit.  Cardiac Risk Factors include: advanced age (>58men, >18 women);obesity (BMI >30kg/m2);hypertension;diabetes mellitus     Objective:    Today's Vitals   11/08/16 1031  BP: (!) 154/82  Pulse: 83  Temp: 97.5 F (36.4 C)  TempSrc: Oral  SpO2: 94%  Weight: 141 lb 3.2 oz (64 kg)  Height: 4' 9.5" (1.461 m)   Body mass index is 30.03 kg/m.   Current Medications (verified) Outpatient Encounter Prescriptions as of 11/08/2016  Medication Sig  . aspirin 81 MG tablet Take 81 mg by mouth daily.  . Biotin 5000 MCG CAPS Take 1 capsule by mouth daily.  . cetirizine (ZYRTEC) 10 MG tablet Take 10 mg by mouth daily.  . metFORMIN (GLUCOPHAGE-XR) 500 MG 24 hr tablet TAKE 1 TABLET TWICE A DAY (Patient taking differently: TAKE 1 TABLET QD)  . metoprolol succinate (TOPROL-XL) 100 MG 24 hr tablet TAKE 1 TABLET TWICE A DAY  . Omega-3 Fatty Acids (FISH OIL) 1200 MG CAPS Take 1 capsule by mouth daily.  Marland Kitchen omeprazole (PRILOSEC) 20 MG capsule Take 20 mg by mouth daily.  . ramipril (ALTACE) 10 MG capsule Take 1 capsule (10 mg total) by mouth daily.  . rosuvastatin (CRESTOR) 20 MG tablet TAKE 1 TABLET (20 MG TOTAL) BY MOUTH DAILY.  . traZODone (DESYREL) 50 MG tablet Take 0.5-1 tablets (25-50 mg total) by mouth at bedtime as needed for sleep.   No facility-administered encounter medications on file as of 11/08/2016.     Allergies (verified) Ciprofloxacin; Levaquin [levofloxacin]; and Tequin [gatifloxacin]   History: Past Medical History:  Diagnosis Date  . Arthritis   . AV malformation of gastrointestinal tract   . Blood in stool   . CAD (coronary artery disease)   . Carotid arterial disease ([place])   . Cataracts, bilateral   . Chronic blood loss anemia   . Chronic cystitis   . Depression   . Diabetes mellitus ([place])   . GERD  (gastroesophageal reflux disease)   . Hyperlipidemia   . Hypertension   . IDA (iron deficiency anemia)   . Stress incontinence    Past Surgical History:  Procedure Laterality Date  . ABDOMINAL HYSTERECTOMY  1972   ovaries left in place  . APPENDECTOMY  1992  . BACK SURGERY  06/08/2010   L3, L4, L5  . CAROTID ARTERY ANGIOPLASTY  11/03  . [name], 92   right then left   . CHOLECYSTECTOMY  1993  . FOOT SURGERY  06/2007   Left  . right carotid artery surgery  01/09/13   Dr. Delana Meyer @ [facility]  . TOTAL HIP ARTHROPLASTY  05/03/2011   left 12, right 11/07   Family History  Problem Relation Age of Onset  . Hyperlipidemia Father   . Heart disease Father     myocardial infarction  . Hypertension Father     Parent  . Arthritis Other     parent  . Diabetes Other     nephew  . Cervical cancer Sister   . Rectal cancer Sister   . Breast cancer Neg Hx    Social History   Occupational History  . Retired    Social History Main Topics  . Smoking status: Former Smoker    Types: Cigarettes    Quit date: 11/19/1989  .  Smokeless tobacco: Never Used  . Alcohol use No  . Drug use: No  . Sexual activity: Not on file    Tobacco Counseling Counseling given: Not Answered   Activities of Daily Living In your present state of health, do you have any difficulty performing the following activities: 11/08/2016  Hearing? N  Vision? N  Difficulty concentrating or making decisions? N  Walking or climbing stairs? Y  Dressing or bathing? N  Doing errands, shopping? N  Preparing Food and eating ? N  Using the Toilet? N  In the past six months, have you accidently leaked urine? Y  Do you have problems with loss of bowel control? N  Managing your Medications? N  Managing your Finances? N  Housekeeping or managing your Housekeeping? N  Some recent data might be hidden    Immunizations and Health Maintenance Immunization History  Administered Date(s) Administered  .  Influenza, High Dose Seasonal PF 11/08/2016  . Influenza,inj,Quad PF,36+ Mos 10/07/2013, 07/18/2015  . Pneumococcal Polysaccharide-23 09/24/2007   Health Maintenance Due  Topic Date Due  . TETANUS/TDAP  06/12/1951  . ZOSTAVAX  06/12/1994  . DEXA SCAN  06/13/1999  . PNA vac Low Risk Adult (2 of 2 - PCV13) 09/23/2008  . FOOT EXAM  10/06/2014  . OPHTHALMOLOGY EXAM  10/27/2016    Patient Care Team: Einar Pheasant, MD as PCP - General (Internal Medicine) Einar Pheasant, MD (Internal Medicine) Leia Alf, MD (Inactive) as Referring Physician (Internal Medicine) Isaias Cowman, MD (Internal Medicine) Albesa Seen, MD (Unknown Physician Specialty) Philis Kendall, MD (Unknown Physician Specialty) Brendolyn Patty, MD (Specialist) Katha Cabal, MD (Vascular Surgery) Leia Alf, MD (Inactive) as Referring Physician (Internal Medicine) Isaias Cowman, MD (Internal Medicine) Albesa Seen, MD (Unknown Physician Specialty) Philis Kendall, MD (Unknown Physician Specialty) Brendolyn Patty, MD (Specialist) Katha Cabal, MD (Vascular Surgery)  Indicate any recent Medical Services you may have received from other than [facility] providers in the past year (date may be approximate).     Assessment:   This is a routine wellness examination for [place]. The goal of the wellness visit is to assist the patient how to close the gaps in care and create a preventative care plan for the patient.   Osteoporosis risk reviewed.  Medications reviewed; taking without issues or barriers.  Safety issues reviewed; lives alone.  Smoke detectors in the home. No firearms in the home. Wears seatbelts when driving or riding with others. No violence in the home.  No identified risk were noted; The patient was oriented x 3; appropriate in dress and manner and no objective failures at ADL's or IADL's.   BMI; discussed the importance of a healthy diet, water intake and exercise. Educational  material provided.  HTN; followed by PCP.  High dose influenza administered L deltoid, tolerated well.  Educational material provided.  Patient Concerns: None at this time. Follow up with PCP as needed.  Hearing/Vision screen Hearing Screening Comments: Passes the whisper test Vision Screening Comments: Follow[facility]  Wears glasses Last OV 04/2016 No retinopathy reported  Dietary issues and exercise activities discussed: Current Exercise Habits: Home exercise routine (Chair exercises), Type of exercise: stretching, Frequency (Times/Week): 7, Intensity: Mild  Goals    . Increase physical activity      Depression Screen PHQ 2/9 Scores 11/08/2016 09/20/2015 06/08/2014 05/31/2013  PHQ - 2 Score 0 0 0 0    Fall Risk Fall Risk  11/08/2016 09/20/2015 06/08/2014  Falls in the past year? No No  No    Cognitive Function:     6CIT Screen 11/08/2016  What Year? 0 points  What month? 0 points  What time? 0 points  Count back from 20 0 points  Months in reverse 0 points    Screening Tests Health Maintenance  Topic Date Due  . TETANUS/TDAP  06/12/1951  . ZOSTAVAX  06/12/1994  . DEXA SCAN  06/13/1999  . PNA vac Low Risk Adult (2 of 2 - PCV13) 09/23/2008  . FOOT EXAM  10/06/2014  . OPHTHALMOLOGY EXAM  10/27/2016  . HEMOGLOBIN A1C  05/06/2017  . MAMMOGRAM  10/29/2017  . INFLUENZA VACCINE  Completed      Plan:    End of life planning; Advance aging; Advanced directives discussed. Copy of current HCPOA/Living Will requested.  Medicare Attestation I have personally reviewed: The patient's medical and social history Their use of alcohol, tobacco or illicit drugs Their current medications and supplements The patient's functional ability including ADLs,fall risks, home safety risks, cognitive, and hearing and visual impairment Diet and physical activities Evidence for depression   The patient's weight, height, BMI, and visual acuity have been recorded in  the chart.  I have made referrals and provided education to the patient based on review of the above and I have provided the patient with a written personalized care plan for preventive services.    During the course of the visit, Abri was educated and counseled about the following appropriate screening and preventive services:   Vaccines to include Pneumoccal, Influenza, Hepatitis B, Td, Zostavax, HCV  Electrocardiogram  Cardiovascular disease screening  Colorectal cancer screening  Bone density screening  Diabetes screening  Glaucoma screening  Mammography/PAP  Nutrition counseling  Smoking cessation counseling  Patient Instructions (the written plan) were given to the patient.    Varney Biles, LPN   21/22/4825    Reviewed above information.  Agree with plan.  Dr Nicki Reaper

## 2016-11-08 NOTE — Progress Notes (Signed)
Patient ID: Jocelyn Elliott, female   DOB: [birth year], 82 y.o.   MRN: [id number]   Subjective:    Patient ID: Jocelyn Elliott, female    DOB: [birth year], 82 y.o.   MRN: [id number]  HPI  Patient here for her physical exam.  She is followed by hematology for her anemia.  Receives IV iron.  Last hgb 13.5.  Recommended f/u in 6 months.  She is also followed by vascular surgery (Dr Delana Meyer) and cardiology.  States she is doing well.  Tries to stay active.  Discussed diet and exercise.  No chest pain.  No sob.  No acid reflux. No abdominal pain or cramping.  No rectal bleeding.  Just had eyes checked.  New glasses.     Past Medical History:  Diagnosis Date  . Arthritis   . AV malformation of gastrointestinal tract   . Blood in stool   . CAD (coronary artery disease)   . Carotid arterial disease ([place])   . Cataracts, bilateral   . Chronic blood loss anemia   . Chronic cystitis   . Depression   . Diabetes mellitus ([place])   . GERD (gastroesophageal reflux disease)   . Hyperlipidemia   . Hypertension   . IDA (iron deficiency anemia)   . Stress incontinence    Past Surgical History:  Procedure Laterality Date  . ABDOMINAL HYSTERECTOMY  1972   ovaries left in place  . APPENDECTOMY  1992  . BACK SURGERY  06/08/2010   L3, L4, L5  . CAROTID ARTERY ANGIOPLASTY  11/03  . [place], 92   right then left   . CHOLECYSTECTOMY  1993  . FOOT SURGERY  06/2007   Left  . right carotid artery surgery  01/09/13   Dr. Delana Meyer @ [facility]  . TOTAL HIP ARTHROPLASTY  05/03/2011   left 12, right 11/07   Family History  Problem Relation Age of Onset  . Hyperlipidemia Father   . Heart disease Father     myocardial infarction  . Hypertension Father     Parent  . Arthritis Other     parent  . Diabetes Other     nephew  . Cervical cancer Sister   . Rectal cancer Sister   . Breast cancer Neg Hx    Social History   Social History  . Marital status: Widowed    Spouse name: N/A  .  Number of children: 1  . Years of education: 57   Occupational History  . Retired    Social History Main Topics  . Smoking status: Former Smoker    Types: Cigarettes    Quit date: 11/19/1989  . Smokeless tobacco: Never Used  . Alcohol use No  . Drug use: No  . Sexual activity: Not Asked   Other Topics Concern  . None   Social History Narrative   Regular exercise-no   Caffeine Use-yes    Outpatient Encounter Prescriptions as of 11/08/2016  Medication Sig  . aspirin 81 MG tablet Take 81 mg by mouth daily.  . Biotin 5000 MCG CAPS Take 1 capsule by mouth daily.  . cetirizine (ZYRTEC) 10 MG tablet Take 10 mg by mouth daily.  . metFORMIN (GLUCOPHAGE-XR) 500 MG 24 hr tablet TAKE 1 TABLET TWICE A DAY (Patient taking differently: TAKE 1 TABLET QD)  . metoprolol succinate (TOPROL-XL) 100 MG 24 hr tablet TAKE 1 TABLET TWICE A DAY  . Omega-3 Fatty Acids (FISH OIL) 1200 MG CAPS Take 1 capsule  by mouth daily.  Marland Kitchen omeprazole (PRILOSEC) 20 MG capsule Take 20 mg by mouth daily.  . ramipril (ALTACE) 10 MG capsule Take 1 capsule (10 mg total) by mouth daily.  . rosuvastatin (CRESTOR) 20 MG tablet TAKE 1 TABLET (20 MG TOTAL) BY MOUTH DAILY.  . traZODone (DESYREL) 50 MG tablet Take 0.5-1 tablets (25-50 mg total) by mouth at bedtime as needed for sleep.   No facility-administered encounter medications on file as of 11/08/2016.     Review of Systems  Constitutional: Negative for appetite change and unexpected weight change.  HENT: Negative for congestion, sinus pressure and sore throat.   Eyes: Negative for pain and visual disturbance.  Respiratory: Negative for cough, chest tightness and shortness of breath.   Cardiovascular: Negative for chest pain, palpitations and leg swelling.  Gastrointestinal: Negative for abdominal pain, diarrhea, nausea and vomiting.  Genitourinary: Negative for difficulty urinating and dysuria.  Musculoskeletal: Negative for back pain and joint swelling.  Skin:  Negative for color change and rash.  Neurological: Negative for dizziness, light-headedness and headaches.  Hematological: Negative for adenopathy. Does not bruise/bleed easily.  Psychiatric/Behavioral: Negative for agitation and dysphoric mood.       Objective:     Blood pressure rechecked by me:  122/78  Physical Exam  Constitutional: She is oriented to person, place, and time. She appears well-developed and well-nourished. No distress.  HENT:  Nose: Nose normal.  Mouth/Throat: Oropharynx is clear and moist.  Eyes: Right eye exhibits no discharge. Left eye exhibits no discharge. No scleral icterus.  Neck: Neck supple. No thyromegaly present.  Cardiovascular: Normal rate and regular rhythm.   Pulmonary/Chest: Breath sounds normal. No accessory muscle usage. No tachypnea. No respiratory distress. She has no decreased breath sounds. She has no wheezes. She has no rhonchi. Right breast exhibits no inverted nipple, no mass, no nipple discharge and no tenderness (no axillary adenopathy). Left breast exhibits no inverted nipple, no mass, no nipple discharge and no tenderness (no axilarry adenopathy).  Abdominal: Soft. Bowel sounds are normal. There is no tenderness.  Musculoskeletal: She exhibits no edema or tenderness.  Lymphadenopathy:    She has no cervical adenopathy.  Neurological: She is alert and oriented to person, place, and time.  Skin: Skin is warm. No rash noted. No erythema.  Psychiatric: She has a normal mood and affect. Her behavior is normal.    BP 122/78   Pulse 83   Temp 97.5 F (36.4 C) (Oral)   Ht 4' 9.5" (1.461 m)   Wt 141 lb 3.2 oz (64 kg)   SpO2 94%   BMI 30.03 kg/m  Wt Readings from Last 3 Encounters:  11/08/16 141 lb 3.2 oz (64 kg)  09/20/16 140 lb (63.5 kg)  06/28/16 139 lb 3.2 oz (63.1 kg)     Lab Results  Component Value Date   WBC 6.1 09/19/2016   HGB 13.0 09/19/2016   HCT 38.4 09/19/2016   PLT 167 09/19/2016   GLUCOSE 141 (H) 11/05/2016    CHOL 172 11/05/2016   TRIG 209.0 (H) 11/05/2016   HDL 49.10 11/05/2016   LDLDIRECT 90.0 11/05/2016   LDLCALC 85 09/15/2015   ALT 31 11/05/2016   AST 30 11/05/2016   NA 141 11/05/2016   K 4.5 11/05/2016   CL 103 11/05/2016   CREATININE 0.95 11/05/2016   BUN 17 11/05/2016   CO2 31 11/05/2016   TSH 2.05 06/25/2016   INR 1.0 01/10/2013   HGBA1C 7.3 (H) 11/05/2016   MICROALBUR 5.7 (  H) 06/25/2016    Mm Screening Breast Tomo Bilateral  Result Date: 10/30/2016 CLINICAL DATA:  Screening. EXAM: 2D DIGITAL SCREENING BILATERAL MAMMOGRAM WITH CAD AND ADJUNCT TOMO COMPARISON:  Previous exam(s). ACR Breast Density Category d: The breast tissue is extremely dense, which lowers the sensitivity of mammography. FINDINGS: There are no findings suspicious for malignancy. Images were processed with CAD. IMPRESSION: No mammographic evidence of malignancy. A result letter of this screening mammogram will be mailed directly to the patient. RECOMMENDATION: Screening mammogram in one year. (Code:SM-B-01Y) BI-RADS CATEGORY  1: Negative. Electronically Signed   By: Ammie Ferrier M.D.   On: 10/30/2016 10:04       Assessment & Plan:   Problem List Items Addressed This Visit    CAD (coronary artery disease)    Followed by cardiology.  Stable.       Carotid arterial disease ([place])    Sees vascular surgery.  Has f/u planned.  Continue risk factor modification.       Diabetes mellitus ([place])    Discussed recently lab results.  Slight increased a1c when compared to previous.  Low carb diet and exercise.  Just had her eyes checked.  Follow met b and a1c.        Health care maintenance    Physical today 11/08/16.  Mammogram 10/30/16 - Birads I.        Hypercholesteremia    On crestor.  Low cholesterol diet and exercise.  Follow  Lipid panel and liver function tests.        Hypertension    Blood pressure on recheck improved.  Same medication regimen.  Follow pressures.        Iron deficiency anemia  due to chronic blood loss    Recurrent iron deficiency anemia - receiving IV iron.  Last hgb 13.5.         Other Visit Diagnoses    Encounter for Medicare annual wellness exam    -  Primary   Need for prophylactic vaccination and inoculation against influenza       Relevant Orders   Flu vaccine HIGH DOSE PF (Fluzone High dose) (Completed)       Einar Pheasant, MD

## 2016-11-09 ENCOUNTER — Encounter: Payer: Self-pay | Admitting: Internal Medicine

## 2016-11-09 NOTE — Assessment & Plan Note (Signed)
Discussed recently lab results.  Slight increased a1c when compared to previous.  Low carb diet and exercise.  Just had her eyes checked.  Follow met b and a1c.

## 2016-11-09 NOTE — Assessment & Plan Note (Signed)
On crestor.  Low cholesterol diet and exercise.  Follow  Lipid panel and liver function tests.   

## 2016-11-09 NOTE — Assessment & Plan Note (Signed)
Physical today 11/08/16.  Mammogram 10/30/16 - Birads I.

## 2016-11-09 NOTE — Assessment & Plan Note (Signed)
Blood pressure on recheck improved.  Same medication regimen.  Follow pressures.

## 2016-11-09 NOTE — Assessment & Plan Note (Signed)
Followed by cardiology. Stable.   

## 2016-11-09 NOTE — Assessment & Plan Note (Signed)
Recurrent iron deficiency anemia - receiving IV iron.  Last hgb 13.5.

## 2016-11-09 NOTE — Assessment & Plan Note (Signed)
Sees vascular surgery.  Has f/u planned.  Continue risk factor modification.

## 2016-11-18 ENCOUNTER — Ambulatory Visit
Admission: EM | Admit: 2016-11-18 | Discharge: 2016-11-18 | Disposition: A | Discharge status: Home / Self Care | Payer: Medicare Other | Attending: Family Medicine | Admitting: Family Medicine

## 2016-11-18 ENCOUNTER — Encounter: Payer: Self-pay | Admitting: Emergency Medicine

## 2016-11-18 DIAGNOSIS — J209 Acute bronchitis, unspecified: Secondary | ICD-10-CM | POA: Diagnosis not present

## 2016-11-18 MED ORDER — AZITHROMYCIN 250 MG PO TABS: ORAL_TABLET | ORAL | 0 refills | Status: DC

## 2016-11-18 MED ORDER — HYDROCOD POLST-CPM POLST ER 10-8 MG/5ML PO SUER: 2.5000 mL | Freq: Two times a day (BID) | ORAL | 0 refills | Status: DC | PRN

## 2016-11-18 NOTE — ED Provider Notes (Signed)
[facility]    CSN: [id number] Arrival date & time: 11/18/16  1143     History   Chief Complaint Chief Complaint  Patient presents with  . Cough  . Nasal Congestion    HPI Jocelyn Elliott is a 82 y.o. female.   Patient is an 82 year old white female history of coughing since 2 days before Christmas or now to go. She reports that she'll get was bronchitis she was trying over-the-counter medications for the last 9 days and not doing better. Last night she had to sleep in recliner so she finally came in to be seen to be evaluated. She reports coughing nasal significant nasal congestion but she feels a little bit lightheaded as well. Not coughing and congestion. She had a flu shot about 2 weeks ago. She does not think his flu she is not running any fever she states. Past medical history is extensive she's had coronary artery disease cataract removal chronic blood loss depression diabetes GERD hyperlipidemia and hypertension. She is allergic to the quinolones and Tequin. She denies any significant wheezing during the day and she is a former smoker. No stiff and past family medical history relevant to today's visit   The history is provided by the patient.  Cough  Cough characteristics:  Productive Sputum characteristics:  Yellow Onset quality:  Unable to specify Duration:  9 days Timing:  Constant Chronicity:  New Smoker: Former smoker.   Context: upper respiratory infection   Context: not smoke exposure   Relieved by:  Nothing Worsened by:  Nothing Ineffective treatments:  None tried Associated symptoms: shortness of breath     Past Medical History:  Diagnosis Date  . Arthritis   . AV malformation of gastrointestinal tract   . Blood in stool   . CAD (coronary artery disease)   . Carotid arterial disease ([place])   . Cataracts, bilateral   . Chronic blood loss anemia   . Chronic cystitis   . Depression   . Diabetes mellitus ([place])   . GERD (gastroesophageal  reflux disease)   . Hyperlipidemia   . Hypertension   . IDA (iron deficiency anemia)   . Stress incontinence     Patient Active Problem List   Diagnosis Date Noted  . Iron deficiency anemia due to chronic blood loss 09/20/2016  . Hypotension 04/06/2016  . Respiratory infection 04/06/2016  . Health care maintenance 10/28/2015  . SOB (shortness of breath) on exertion 09/20/2015  . Knee pain, bilateral 09/20/2015  . Sleeping difficulty 07/24/2015  . Hepatomegaly 07/24/2015  . Generalized anxiety disorder 04/23/2015  . UTI (urinary tract infection) 05/22/2014  . Hip pain 10/11/2013  . Tendonitis 07/19/2013  . Leg numbness 04/07/2013  . CAD (coronary artery disease) 09/05/2012  . Hypertension 09/05/2012  . Hypercholesteremia 09/05/2012  . Diabetes mellitus ([place]) 09/05/2012  . Carotid arterial disease ([place]) 09/05/2012  . Anemia 09/05/2012    Past Surgical History:  Procedure Laterality Date  . ABDOMINAL HYSTERECTOMY  1972   ovaries left in place  . APPENDECTOMY  1992  . BACK SURGERY  06/08/2010   L3, L4, L5  . CAROTID ARTERY ANGIOPLASTY  11/03  . [place], 92   right then left   . CHOLECYSTECTOMY  1993  . FOOT SURGERY  06/2007   Left  . right carotid artery surgery  01/09/13   Dr. Delana Meyer @ [facility]  . TOTAL HIP ARTHROPLASTY  05/03/2011   left 12, right 11/07    OB History  No data available       Home Medications    Prior to Admission medications   Medication Sig Start Date End Date Taking? Authorizing Provider  aspirin 81 MG tablet Take 81 mg by mouth daily.   Yes Historical Provider, MD  Biotin 5000 MCG CAPS Take 1 capsule by mouth daily.   Yes Historical Provider, MD  cetirizine (ZYRTEC) 10 MG tablet Take 10 mg by mouth daily.   Yes Historical Provider, MD  metFORMIN (GLUCOPHAGE-XR) 500 MG 24 hr tablet TAKE 1 TABLET TWICE A DAY Patient taking differently: TAKE 1 TABLET QD 02/27/16  Yes Einar Pheasant, MD  metoprolol succinate (TOPROL-XL)  100 MG 24 hr tablet TAKE 1 TABLET TWICE A DAY 02/20/16  Yes Einar Pheasant, MD  Omega-3 Fatty Acids (FISH OIL) 1200 MG CAPS Take 1 capsule by mouth daily.   Yes Historical Provider, MD  omeprazole (PRILOSEC) 20 MG capsule Take 20 mg by mouth daily.   Yes Historical Provider, MD  ramipril (ALTACE) 10 MG capsule Take 1 capsule (10 mg total) by mouth daily. 09/04/12  Yes Einar Pheasant, MD  rosuvastatin (CRESTOR) 20 MG tablet TAKE 1 TABLET (20 MG TOTAL) BY MOUTH DAILY. 09/11/16  Yes Einar Pheasant, MD  traZODone (DESYREL) 50 MG tablet Take 0.5-1 tablets (25-50 mg total) by mouth at bedtime as needed for sleep. 08/21/16  Yes Einar Pheasant, MD  azithromycin (ZITHROMAX Z-PAK) 250 MG tablet Take 2 tablets first day and then 1 po a day for 4 days 11/18/16   Frederich Cha, MD  chlorpheniramine-HYDROcodone [facility]) 10-8 MG/5ML SUER Take 2.5-5 mLs by mouth every 12 (twelve) hours as needed for cough. 11/18/16   Frederich Cha, MD    Family History Family History  Problem Relation Age of Onset  . Hyperlipidemia Father   . Heart disease Father     myocardial infarction  . Hypertension Father     Parent  . Arthritis Other     parent  . Diabetes Other     nephew  . Cervical cancer Sister   . Rectal cancer Sister   . Breast cancer Neg Hx     Social History Social History  Substance Use Topics  . Smoking status: Former Smoker    Types: Cigarettes    Quit date: 11/19/1989  . Smokeless tobacco: Never Used  . Alcohol use No     Allergies   Ciprofloxacin; Levaquin [levofloxacin]; and Tequin [gatifloxacin]   Review of Systems Review of Systems  Respiratory: Positive for cough and shortness of breath.   All other systems reviewed and are negative.    Physical Exam Triage Vital Signs ED Triage Vitals  Enc Vitals Group     BP 11/18/16 1228 (!) 122/55     Pulse Rate 11/18/16 1228 77     Resp --      Temp 11/18/16 1228 97.9 F (36.6 C)     Temp Source 11/18/16 1228 Tympanic      SpO2 11/18/16 1228 95 %     Weight 11/18/16 1231 141 lb (64 kg)     Height 11/18/16 1231 4' 9.5" (1.461 m)     Head Circumference --      Peak Flow --      Pain Score --      Pain Loc --      Pain Edu? --      Excl. in [place]? --    No data found.   Updated Vital Signs BP (!) 122/55 (BP Location:  Left Arm)   Pulse 77   Temp 97.9 F (36.6 C) (Tympanic)   Ht 4' 9.5" (1.461 m)   Wt 141 lb (64 kg)   SpO2 95%   BMI 29.98 kg/m   Visual Acuity Right Eye Distance:   Left Eye Distance:   Bilateral Distance:    Right Eye Near:   Left Eye Near:    Bilateral Near:     Physical Exam  Constitutional: She is oriented to person, place, and time. Vital signs are normal. She appears well-developed and well-nourished.  Elderly white female  HENT:  Head: Normocephalic and atraumatic.  Right Ear: External ear normal.  Left Ear: External ear normal.  Mouth/Throat: Oropharynx is clear and moist.  Eyes: Pupils are equal, round, and reactive to light.  Neck: Normal range of motion. Neck supple.  Cardiovascular: Normal rate and regular rhythm.   Pulmonary/Chest: Effort normal and breath sounds normal.  Musculoskeletal: Normal range of motion.  Neurological: She is alert and oriented to person, place, and time.  Skin: Skin is warm.  Psychiatric: She has a normal mood and affect.  Vitals reviewed.    UC Treatments / Results  Labs (all labs ordered are listed, but only abnormal results are displayed) Labs Reviewed - No data to display  EKG  EKG Interpretation None       Radiology No results found.  Procedures Procedures (including critical care time)  Medications Ordered in UC Medications - No data to display   Initial Impression / Assessment and Plan / UC Course  I have reviewed the triage vital signs and the nursing notes.  Pertinent labs & imaging results that were available during my care of the patient were reviewed by me and considered in my medical decision  making (see chart for details).  Clinical Course     Zithromax worked well for her before we'll place on Zithromax because the bowel coughing she is doing will go with Tussionex because of her age recommending only half a teaspoon once a twice a day just suppress cough if that does not work she go to a full teaspoon but be careful with test next visit can cause sedation and respiratory does have a time release narcotic past. Follow-up with Dr. Nicki Reaper not better in about 3-5 days.  Final Clinical Impressions(s) / UC Diagnoses   Final diagnoses:  Acute bronchitis, unspecified organism    New Prescriptions Discharge Medication List as of 11/18/2016  1:22 PM    START taking these medications   Details  chlorpheniramine-HYDROcodone (TUSSIONEX PENNKINETIC ER) 10-8 MG/5ML SUER Take 2.5-5 mLs by mouth every 12 (twelve) hours as needed for cough., Starting Sun 11/18/2016, Normal    azithromycin (ZITHROMAX Z-PAK) 250 MG tablet Take 2 tablets first day and then 1 po a day for 4 days, Normal         Note: This dictation was prepared with Dragon dictation along with smaller phrase technology. Any transcriptional errors that result from this process are unintentional.   Frederich Cha, MD 11/18/16 1415

## 2016-11-18 NOTE — ED Triage Notes (Signed)
Patient stated she has cough, congestion and weakness for 1 week. Has taken Mucinex,, Delsym and Tylenol with no releif

## 2016-12-10 ENCOUNTER — Telehealth: Payer: Self-pay | Admitting: *Deleted

## 2016-12-10 NOTE — Telephone Encounter (Signed)
Pt requested to have her [facility]

## 2016-12-12 NOTE — Telephone Encounter (Signed)
Pt called back. Requesting her rosuvastatin to be sent to [facility] and not to [facility]. Please cb pt once it has been sent to them. [phone]

## 2016-12-13 ENCOUNTER — Other Ambulatory Visit: Payer: Self-pay

## 2016-12-13 MED ORDER — ROSUVASTATIN CALCIUM 20 MG PO TABS: 20.0000 mg | ORAL_TABLET | Freq: Every day | ORAL | 2 refills | Status: DC

## 2016-12-13 NOTE — Telephone Encounter (Signed)
Patient advised script sent

## 2017-02-07 ENCOUNTER — Ambulatory Visit: Payer: Medicare Other | Admitting: Internal Medicine

## 2017-02-14 ENCOUNTER — Other Ambulatory Visit: Payer: Self-pay | Admitting: Internal Medicine

## 2017-03-20 ENCOUNTER — Inpatient Hospital Stay: Payer: Medicare Other | Attending: Internal Medicine

## 2017-03-20 DIAGNOSIS — I10 Essential (primary) hypertension: Secondary | ICD-10-CM | POA: Diagnosis not present

## 2017-03-20 DIAGNOSIS — Z8719 Personal history of other diseases of the digestive system: Secondary | ICD-10-CM | POA: Diagnosis not present

## 2017-03-20 DIAGNOSIS — D500 Iron deficiency anemia secondary to blood loss (chronic): Secondary | ICD-10-CM | POA: Diagnosis not present

## 2017-03-20 DIAGNOSIS — Z7984 Long term (current) use of oral hypoglycemic drugs: Secondary | ICD-10-CM | POA: Insufficient documentation

## 2017-03-20 DIAGNOSIS — I2510 Atherosclerotic heart disease of native coronary artery without angina pectoris: Secondary | ICD-10-CM | POA: Insufficient documentation

## 2017-03-20 DIAGNOSIS — K219 Gastro-esophageal reflux disease without esophagitis: Secondary | ICD-10-CM | POA: Diagnosis not present

## 2017-03-20 DIAGNOSIS — K5900 Constipation, unspecified: Secondary | ICD-10-CM | POA: Insufficient documentation

## 2017-03-20 DIAGNOSIS — Z7982 Long term (current) use of aspirin: Secondary | ICD-10-CM | POA: Insufficient documentation

## 2017-03-20 DIAGNOSIS — E785 Hyperlipidemia, unspecified: Secondary | ICD-10-CM | POA: Diagnosis not present

## 2017-03-20 DIAGNOSIS — Z87891 Personal history of nicotine dependence: Secondary | ICD-10-CM | POA: Insufficient documentation

## 2017-03-20 DIAGNOSIS — E119 Type 2 diabetes mellitus without complications: Secondary | ICD-10-CM | POA: Diagnosis not present

## 2017-03-20 LAB — CBC WITH DIFFERENTIAL/PLATELET
Basophils Absolute: 0.2 10*3/uL — ABNORMAL HIGH (ref 0–0.1)
Basophils Relative: 2 %
EOS ABS: 0.2 10*3/uL (ref 0–0.7)
Eosinophils Relative: 3 %
HCT: 32.4 % — ABNORMAL LOW (ref 35.0–47.0)
Hemoglobin: 10.6 g/dL — ABNORMAL LOW (ref 12.0–16.0)
LYMPHS ABS: 2.2 10*3/uL (ref 1.0–3.6)
LYMPHS PCT: 32 %
MCH: 27 pg (ref 26.0–34.0)
MCHC: 32.7 g/dL (ref 32.0–36.0)
MCV: 82.5 fL (ref 80.0–100.0)
MONO ABS: 0.4 10*3/uL (ref 0.2–0.9)
MONOS PCT: 7 %
NEUTROS ABS: 3.9 10*3/uL (ref 1.4–6.5)
Neutrophils Relative %: 56 %
PLATELETS: 237 10*3/uL (ref 150–440)
RBC: 3.93 MIL/uL (ref 3.80–5.20)
RDW: 15.4 % — AB (ref 11.5–14.5)
WBC: 6.9 10*3/uL (ref 3.6–11.0)

## 2017-03-20 LAB — BASIC METABOLIC PANEL
Anion gap: 7 (ref 5–15)
BUN: 19 mg/dL (ref 6–20)
CALCIUM: 9.2 mg/dL (ref 8.9–10.3)
CO2: 25 mmol/L (ref 22–32)
CREATININE: 1.01 mg/dL — AB (ref 0.44–1.00)
Chloride: 104 mmol/L (ref 101–111)
GFR, EST AFRICAN AMERICAN: 58 mL/min — AB (ref >60)
GFR, EST NON AFRICAN AMERICAN: 50 mL/min — AB (ref >60)
Glucose, Bld: 170 mg/dL — ABNORMAL HIGH (ref 65–99)
Potassium: 4.1 mmol/L (ref 3.5–5.1)
SODIUM: 136 mmol/L (ref 135–145)

## 2017-03-20 LAB — IRON AND TIBC
IRON: 28 ug/dL (ref 28–170)
Saturation Ratios: 6 % — ABNORMAL LOW (ref 10.4–31.8)
TIBC: 436 ug/dL (ref 250–450)
UIBC: 408 ug/dL

## 2017-03-20 LAB — FERRITIN: Ferritin: 10 ng/mL — ABNORMAL LOW (ref 11–307)

## 2017-03-27 ENCOUNTER — Inpatient Hospital Stay: Payer: Medicare Other

## 2017-03-27 ENCOUNTER — Inpatient Hospital Stay (HOSPITAL_BASED_OUTPATIENT_CLINIC_OR_DEPARTMENT_OTHER): Payer: Medicare Other | Admitting: Internal Medicine

## 2017-03-27 VITALS — BP 124/65 | HR 69 | Temp 97.2°F | Resp 16 | Wt 140.2 lb

## 2017-03-27 DIAGNOSIS — Z79899 Other long term (current) drug therapy: Secondary | ICD-10-CM | POA: Diagnosis not present

## 2017-03-27 DIAGNOSIS — D500 Iron deficiency anemia secondary to blood loss (chronic): Secondary | ICD-10-CM | POA: Diagnosis not present

## 2017-03-27 DIAGNOSIS — Z8719 Personal history of other diseases of the digestive system: Secondary | ICD-10-CM

## 2017-03-27 DIAGNOSIS — D508 Other iron deficiency anemias: Secondary | ICD-10-CM

## 2017-03-27 MED ORDER — SODIUM CHLORIDE 0.9 % IV SOLN
510.0000 mg | Freq: Once | INTRAVENOUS | Status: AC
  Administered 2017-03-27: 510 mg via INTRAVENOUS
  Filled 2017-03-27: qty 17

## 2017-03-27 MED ORDER — SODIUM CHLORIDE 0.9 % IV SOLN
Freq: Once | INTRAVENOUS | Status: AC
  Administered 2017-03-27: 14:00:00 via INTRAVENOUS
  Filled 2017-03-27: qty 1000

## 2017-03-27 NOTE — Progress Notes (Signed)
Patient here today for follow up.  Patient states no new concerns today  

## 2017-03-27 NOTE — Progress Notes (Signed)
[facility] OFFICE PROGRESS NOTE  Patient Care Team: Einar Pheasant, MD as PCP - General (Internal Medicine) Einar Pheasant, MD (Internal Medicine) Leia Alf, MD (Inactive) as Referring Physician (Internal Medicine) Isaias Cowman, MD (Internal Medicine) Albesa Seen, MD (Unknown Physician Specialty) Philis Kendall, MD (Unknown Physician Specialty) Brendolyn Patty, MD (Specialist) Schnier, Dolores Lory, MD (Vascular Surgery) Leia Alf, MD (Inactive) as Referring Physician (Internal Medicine) Isaias Cowman, MD (Internal Medicine) Albesa Seen, MD (Unknown Physician Specialty) Philis Kendall, MD (Unknown Physician Specialty) Brendolyn Patty, MD (Specialist) Schnier, Dolores Lory, MD (Vascular Surgery)   SUMMARY OF HEMATOLOGIC HISTORY:  # IRON DEFICIENCY ANEMIA- recurrent [? GI blood loss; Dr.Elliot]; IV Ferrahem q 89m   INTERVAL HISTORY:   83 year old female patient with above history of recurrent iron deficiency anemia needing IV iron; Patient's last IV iron was in July 2017.   Patient complains of fatigue especially exertion. Patient denies any blood in stools black colored stools. No weight loss. No chest pain or shortness of breath or cough. No swelling in the legs. Patient has poor tolerance to by mouth iron/nausea/constipation.   REVIEW OF SYSTEMS:  A complete 10 point review of system is done which is negative except mentioned above/history of present illness.   PAST MEDICAL HISTORY :  Past Medical History:  Diagnosis Date  . Arthritis   . AV malformation of gastrointestinal tract   . Blood in stool   . CAD (coronary artery disease)   . Carotid arterial disease ([place])   . Cataracts, bilateral   . Chronic blood loss anemia   . Chronic cystitis   . Depression   . Diabetes mellitus ([place])   . GERD (gastroesophageal reflux disease)   . Hyperlipidemia   . Hypertension   . IDA (iron deficiency anemia)   . Stress incontinence     PAST  SURGICAL HISTORY :   Past Surgical History:  Procedure Laterality Date  . ABDOMINAL HYSTERECTOMY  1972   ovaries left in place  . APPENDECTOMY  1992  . BACK SURGERY  06/08/2010   L3, L4, L5  . CAROTID ARTERY ANGIOPLASTY  11/03  . [place], 92   right then left   . CHOLECYSTECTOMY  1993  . FOOT SURGERY  06/2007   Left  . right carotid artery surgery  01/09/13   Dr. Delana Meyer @ [facility]  . TOTAL HIP ARTHROPLASTY  05/03/2011   left 12, right 11/07    FAMILY HISTORY :   Family History  Problem Relation Age of Onset  . Hyperlipidemia Father   . Heart disease Father     myocardial infarction  . Hypertension Father     Parent  . Arthritis Other     parent  . Diabetes Other     nephew  . Cervical cancer Sister   . Rectal cancer Sister   . Breast cancer Neg Hx     SOCIAL HISTORY:   Social History  Substance Use Topics  . Smoking status: Former Smoker    Types: Cigarettes    Quit date: 11/19/1989  . Smokeless tobacco: Never Used  . Alcohol use No    ALLERGIES:  is allergic to ciprofloxacin; levaquin [levofloxacin]; and tequin [gatifloxacin].  MEDICATIONS:  Current Outpatient Prescriptions  Medication Sig Dispense Refill  . aspirin 81 MG tablet Take 81 mg by mouth daily.    . Biotin 5000 MCG CAPS Take 1 capsule by mouth daily.    . cetirizine (ZYRTEC) 10 MG tablet Take 10 mg  by mouth daily.    . metFORMIN (GLUCOPHAGE-XR) 500 MG 24 hr tablet TAKE 1 TABLET TWICE A DAY (Patient taking differently: TAKE 1 TABLET QD) 180 tablet 3  . metoprolol succinate (TOPROL-XL) 100 MG 24 hr tablet TAKE 1 TABLET TWICE A DAY 180 tablet 1  . Omega-3 Fatty Acids (FISH OIL) 1200 MG CAPS Take 1 capsule by mouth daily.    Marland Kitchen omeprazole (PRILOSEC) 20 MG capsule Take 20 mg by mouth daily.    . ramipril (ALTACE) 10 MG capsule Take 1 capsule (10 mg total) by mouth daily. 90 capsule 3  . rosuvastatin (CRESTOR) 20 MG tablet Take 1 tablet (20 mg total) by mouth daily. 90 tablet 2  .  traZODone (DESYREL) 50 MG tablet Take 0.5-1 tablets (25-50 mg total) by mouth at bedtime as needed for sleep. 90 tablet 0   No current facility-administered medications for this visit.     PHYSICAL EXAMINATION:   BP 124/65 (BP Location: Right Arm, Patient Position: Sitting)   Pulse 69   Temp 97.2 F (36.2 C) (Tympanic)   Resp 16   Wt 140 lb 4 oz (63.6 kg)   BMI 29.82 kg/m   Filed Weights   03/27/17 1353  Weight: 140 lb 4 oz (63.6 kg)    GENERAL: Well-nourished well-developed; Alert, no distress and comfortable.  Alone.  EYES: no pallor or icterus OROPHARYNX: no thrush or ulceration; good dentition  NECK: supple, no masses felt LYMPH:  no palpable lymphadenopathy in the cervical, axillary or inguinal regions LUNGS: clear to auscultation and  No wheeze or crackles HEART/CVS: regular rate & rhythm and Positive for murmurs; No lower extremity edema ABDOMEN:abdomen soft, non-tender and normal bowel sounds Musculoskeletal:no cyanosis of digits and no clubbing  PSYCH: alert & oriented x 3 with fluent speech NEURO: no focal motor/sensory deficits SKIN:  no rashes or significant lesions  LABORATORY DATA:  I have reviewed the data as listed    Component Value Date/Time   NA 136 03/20/2017 1350   NA 143 01/10/2013 0314   K 4.1 03/20/2017 1350   K 4.0 01/10/2013 0314   CL 104 03/20/2017 1350   CL 109 (H) 01/10/2013 0314   CO2 25 03/20/2017 1350   CO2 27 01/10/2013 0314   GLUCOSE 170 (H) 03/20/2017 1350   GLUCOSE 119 (H) 01/10/2013 0314   BUN 19 03/20/2017 1350   BUN 15 01/10/2013 0314   CREATININE 1.01 (H) 03/20/2017 1350   CREATININE 1.03 01/10/2013 0314   CREATININE 1.13 (H) 09/18/2012 0848   CALCIUM 9.2 03/20/2017 1350   CALCIUM 8.1 (L) 01/10/2013 0314   PROT 6.6 11/05/2016 0818   PROT 7.2 12/20/2011 1521   ALBUMIN 4.1 11/05/2016 0818   ALBUMIN 3.9 12/20/2011 1521   AST 30 11/05/2016 0818   AST 31 12/20/2011 1521   ALT 31 11/05/2016 0818   ALT 37 12/20/2011 1521    ALKPHOS 62 11/05/2016 0818   ALKPHOS 57 12/20/2011 1521   BILITOT 0.6 11/05/2016 0818   BILITOT 0.5 12/20/2011 1521   GFRNONAA 50 (L) 03/20/2017 1350   GFRNONAA 52 (L) 01/10/2013 0314   GFRNONAA 47 (L) 09/18/2012 0848   GFRAA 58 (L) 03/20/2017 1350   GFRAA >60 01/10/2013 0314   GFRAA 54 (L) 09/18/2012 0848    No results found for: SPEP, UPEP  Lab Results  Component Value Date   WBC 6.9 03/20/2017   NEUTROABS 3.9 03/20/2017   HGB 10.6 (L) 03/20/2017   HCT 32.4 (L) 03/20/2017   MCV  82.5 03/20/2017   PLT 237 03/20/2017      Chemistry      Component Value Date/Time   NA 136 03/20/2017 1350   NA 143 01/10/2013 0314   K 4.1 03/20/2017 1350   K 4.0 01/10/2013 0314   CL 104 03/20/2017 1350   CL 109 (H) 01/10/2013 0314   CO2 25 03/20/2017 1350   CO2 27 01/10/2013 0314   BUN 19 03/20/2017 1350   BUN 15 01/10/2013 0314   CREATININE 1.01 (H) 03/20/2017 1350   CREATININE 1.03 01/10/2013 0314   CREATININE 1.13 (H) 09/18/2012 0848      Component Value Date/Time   CALCIUM 9.2 03/20/2017 1350   CALCIUM 8.1 (L) 01/10/2013 0314   ALKPHOS 62 11/05/2016 0818   ALKPHOS 57 12/20/2011 1521   AST 30 11/05/2016 0818   AST 31 12/20/2011 1521   ALT 31 11/05/2016 0818   ALT 37 12/20/2011 1521   BILITOT 0.6 11/05/2016 0818   BILITOT 0.5 12/20/2011 1521         ASSESSMENT & PLAN:   Iron deficiency anemia due to chronic blood loss # -Recurrent iron deficiency anemia- patient last received IV iron Feraheme end of July 2017. Today hemoglobin is 10.6  MCV is 89. Ferritin-10%; sat 6%. Recommend IV ferrahem today.   # patient will follow-up with labs/possible IV iron in 6 months; and again with me with labs possible IV iron 6 months.      Cammie Sickle, MD 03/27/2017 2:13 PM

## 2017-03-27 NOTE — Assessment & Plan Note (Addendum)
# -  Recurrent iron deficiency anemia- patient last received IV iron Feraheme end of July 2017. Today hemoglobin is 10.6  MCV is 89. Ferritin-10%; sat 6%. Recommend IV ferrahem today.   # patient will follow-up with labs/possible IV iron in 6 months; and again with me with labs possible IV iron 6 months.

## 2017-04-12 ENCOUNTER — Other Ambulatory Visit: Payer: Self-pay | Admitting: Internal Medicine

## 2017-05-17 DIAGNOSIS — M7062 Trochanteric bursitis, left hip: Secondary | ICD-10-CM | POA: Insufficient documentation

## 2017-05-17 DIAGNOSIS — Z96642 Presence of left artificial hip joint: Secondary | ICD-10-CM | POA: Insufficient documentation

## 2017-05-20 ENCOUNTER — Ambulatory Visit (INDEPENDENT_AMBULATORY_CARE_PROVIDER_SITE_OTHER): Payer: Medicare Other | Admitting: Internal Medicine

## 2017-05-20 ENCOUNTER — Encounter: Payer: Self-pay | Admitting: Internal Medicine

## 2017-05-20 VITALS — BP 144/76 | HR 66 | Temp 98.6°F | Resp 12 | Ht <= 58 in | Wt 138.4 lb

## 2017-05-20 DIAGNOSIS — I779 Disorder of arteries and arterioles, unspecified: Secondary | ICD-10-CM | POA: Diagnosis not present

## 2017-05-20 DIAGNOSIS — E7800 Pure hypercholesterolemia, unspecified: Secondary | ICD-10-CM | POA: Diagnosis not present

## 2017-05-20 DIAGNOSIS — E119 Type 2 diabetes mellitus without complications: Secondary | ICD-10-CM | POA: Diagnosis not present

## 2017-05-20 DIAGNOSIS — R42 Dizziness and giddiness: Secondary | ICD-10-CM

## 2017-05-20 DIAGNOSIS — I2510 Atherosclerotic heart disease of native coronary artery without angina pectoris: Secondary | ICD-10-CM | POA: Diagnosis not present

## 2017-05-20 DIAGNOSIS — D649 Anemia, unspecified: Secondary | ICD-10-CM | POA: Diagnosis not present

## 2017-05-20 DIAGNOSIS — I10 Essential (primary) hypertension: Secondary | ICD-10-CM

## 2017-05-20 DIAGNOSIS — I739 Peripheral vascular disease, unspecified: Secondary | ICD-10-CM

## 2017-05-20 LAB — CBC WITH DIFFERENTIAL/PLATELET
Basophils Absolute: 0.1 10*3/uL (ref 0.0–0.1)
Basophils Relative: 1 % (ref 0.0–3.0)
EOS ABS: 0.2 10*3/uL (ref 0.0–0.7)
EOS PCT: 2.1 % (ref 0.0–5.0)
HCT: 41.2 % (ref 36.0–46.0)
HEMOGLOBIN: 13.5 g/dL (ref 12.0–15.0)
Lymphocytes Relative: 27.3 % (ref 12.0–46.0)
Lymphs Abs: 2.6 10*3/uL (ref 0.7–4.0)
MCHC: 32.9 g/dL (ref 30.0–36.0)
MCV: 89.4 fl (ref 78.0–100.0)
MONO ABS: 0.5 10*3/uL (ref 0.1–1.0)
Monocytes Relative: 5.1 % (ref 3.0–12.0)
Neutro Abs: 6.1 10*3/uL (ref 1.4–7.7)
Neutrophils Relative %: 64.5 % (ref 43.0–77.0)
Platelets: 215 10*3/uL (ref 150.0–400.0)
RBC: 4.6 Mil/uL (ref 3.87–5.11)
RDW: 20.8 % — ABNORMAL HIGH (ref 11.5–15.5)
WBC: 9.5 10*3/uL (ref 4.0–10.5)

## 2017-05-20 LAB — HEPATIC FUNCTION PANEL
ALK PHOS: 65 U/L (ref 39–117)
ALT: 42 U/L — ABNORMAL HIGH (ref 0–35)
AST: 42 U/L — AB (ref 0–37)
Albumin: 4.5 g/dL (ref 3.5–5.2)
BILIRUBIN DIRECT: 0.1 mg/dL (ref 0.0–0.3)
BILIRUBIN TOTAL: 0.5 mg/dL (ref 0.2–1.2)
Total Protein: 7.7 g/dL (ref 6.0–8.3)

## 2017-05-20 LAB — BASIC METABOLIC PANEL
BUN: 23 mg/dL (ref 6–23)
CALCIUM: 10.2 mg/dL (ref 8.4–10.5)
CO2: 31 meq/L (ref 19–32)
Chloride: 100 mEq/L (ref 96–112)
Creatinine, Ser: 1.08 mg/dL (ref 0.40–1.20)
GFR: 51.5 mL/min — ABNORMAL LOW (ref >60.00)
GLUCOSE: 102 mg/dL — AB (ref 70–99)
Potassium: 4.4 mEq/L (ref 3.5–5.1)
Sodium: 138 mEq/L (ref 135–145)

## 2017-05-20 LAB — FERRITIN: FERRITIN: 32.8 ng/mL (ref 10.0–291.0)

## 2017-05-20 LAB — HEMOGLOBIN A1C: HEMOGLOBIN A1C: 7.5 % — AB (ref 4.6–6.5)

## 2017-05-20 MED ORDER — METFORMIN HCL 500 MG PO TABS: 500.0000 mg | ORAL_TABLET | Freq: Two times a day (BID) | ORAL | 3 refills | Status: DC

## 2017-05-20 MED ORDER — PANTOPRAZOLE SODIUM 40 MG PO TBEC: 40.0000 mg | DELAYED_RELEASE_TABLET | Freq: Two times a day (BID) | ORAL | 2 refills | Status: DC

## 2017-05-20 MED ORDER — METFORMIN HCL 500 MG PO TABS: 500.0000 mg | ORAL_TABLET | Freq: Every day | ORAL | 2 refills | Status: DC

## 2017-05-20 NOTE — Progress Notes (Signed)
Patient ID: Jocelyn Elliott, female   DOB: [birth year], 83 y.o.   MRN: [id number]   Subjective:    Patient ID: Jocelyn Elliott, female    DOB: [birth year], 83 y.o.   MRN: [id number]  HPI  Patient here for a scheduled follow up.  She is seeing Dr Rogue Bussing.  Receiving IV iron infusions.  Last hgb was decreased.  She reports she has noticed some dizziness recently.  Intermittent.  Started drinking more fluids.  Does feel better.  Some previous minimal headache.  No severe headache.  No vision change.  No chest pain.  Breathing stable.  No acid reflux.  Eating.  No nausea or vomiting.  Bowels  Moving. Has noticed her stool is dark one time per week.  States this has been going on for years.  No change.  Declined rectal exam today.  States blood pressure has been up some recently.  Noticed a couple of readings 329/92 and 426 systolic.     Past Medical History:  Diagnosis Date  . Arthritis   . AV malformation of gastrointestinal tract   . Blood in stool   . CAD (coronary artery disease)   . Carotid arterial disease ([place])   . Cataracts, bilateral   . Chronic blood loss anemia   . Chronic cystitis   . Depression   . Diabetes mellitus ([place])   . GERD (gastroesophageal reflux disease)   . Hyperlipidemia   . Hypertension   . IDA (iron deficiency anemia)   . Stress incontinence    Past Surgical History:  Procedure Laterality Date  . ABDOMINAL HYSTERECTOMY  1972   ovaries left in place  . APPENDECTOMY  1992  . BACK SURGERY  06/08/2010   L3, L4, L5  . CAROTID ARTERY ANGIOPLASTY  11/03  . [place], 92   right then left   . CHOLECYSTECTOMY  1993  . FOOT SURGERY  06/2007   Left  . right carotid artery surgery  01/09/13   Dr. Delana Meyer @ [facility]  . TOTAL HIP ARTHROPLASTY  05/03/2011   left 12, right 11/07   Family History  Problem Relation Age of Onset  . Hyperlipidemia Father   . Heart disease Father        myocardial infarction  . Hypertension Father        Parent  .  Arthritis Other        parent  . Diabetes Other        nephew  . Cervical cancer Sister   . Rectal cancer Sister   . Breast cancer Neg Hx    Social History   Social History  . Marital status: Widowed    Spouse name: N/A  . Number of children: 1  . Years of education: 52   Occupational History  . Retired    Social History Main Topics  . Smoking status: Former Smoker    Types: Cigarettes    Quit date: 11/19/1989  . Smokeless tobacco: Never Used  . Alcohol use No  . Drug use: No  . Sexual activity: Not Asked   Other Topics Concern  . None   Social History Narrative   Regular exercise-no   Caffeine Use-yes    Outpatient Encounter Prescriptions as of 05/20/2017  Medication Sig  . aspirin 81 MG tablet Take 81 mg by mouth daily.  . Biotin 5000 MCG CAPS Take 1 capsule by mouth daily.  . cetirizine (ZYRTEC) 10 MG tablet Take 10 mg by mouth daily.  Marland Kitchen  metFORMIN (GLUCOPHAGE-XR) 500 MG 24 hr tablet TAKE 1 TABLET TWICE A DAY (Patient taking differently: TAKE 1 TABLET QD)  . metoprolol succinate (TOPROL-XL) 100 MG 24 hr tablet TAKE 1 TABLET TWICE A DAY  . Omega-3 Fatty Acids (FISH OIL) 1200 MG CAPS Take 1 capsule by mouth daily.  Marland Kitchen omeprazole (PRILOSEC) 20 MG capsule Take 20 mg by mouth daily.  . ramipril (ALTACE) 10 MG capsule Take 1 capsule (10 mg total) by mouth daily.  . rosuvastatin (CRESTOR) 20 MG tablet Take 1 tablet (20 mg total) by mouth daily.  . traZODone (DESYREL) 50 MG tablet TAKE 0.5-1 TABLETS (25-50 MG TOTAL) BY MOUTH AT BEDTIME AS NEEDED FOR SLEEP.  . metFORMIN (GLUCOPHAGE) 500 MG tablet Take 1 tablet (500 mg total) by mouth daily with breakfast.  . metFORMIN (GLUCOPHAGE) 500 MG tablet Take 1 tablet (500 mg total) by mouth 2 (two) times daily with a meal.  . [DISCONTINUED] pantoprazole (PROTONIX) 40 MG tablet Take 1 tablet (40 mg total) by mouth 2 (two) times daily before a meal.   No facility-administered encounter medications on file as of 05/20/2017.     Review of  Systems  Constitutional: Negative for appetite change and unexpected weight change.  HENT: Negative for congestion and sinus pressure.   Respiratory: Negative for cough, chest tightness and shortness of breath.   Cardiovascular: Negative for chest pain, palpitations and leg swelling.  Gastrointestinal: Negative for abdominal pain, diarrhea, nausea and vomiting.  Genitourinary: Negative for difficulty urinating and dysuria.  Musculoskeletal: Negative for joint swelling and myalgias.  Skin: Negative for color change and rash.  Neurological: Positive for dizziness and light-headedness. Negative for headaches.  Psychiatric/Behavioral: Negative for agitation and dysphoric mood.       Objective:    Physical Exam  Constitutional: She appears well-developed and well-nourished. No distress.  HENT:  Nose: Nose normal.  Mouth/Throat: Oropharynx is clear and moist.  Neck: Neck supple. No thyromegaly present.  Cardiovascular: Normal rate and regular rhythm.   Pulmonary/Chest: Breath sounds normal. No respiratory distress. She has no wheezes.  Abdominal: Soft. Bowel sounds are normal. There is no tenderness.  Musculoskeletal: She exhibits no edema or tenderness.  Lymphadenopathy:    She has no cervical adenopathy.  Skin: No rash noted. No erythema.  Psychiatric: She has a normal mood and affect. Her behavior is normal.    BP (!) 144/76 (BP Location: Left Arm, Patient Position: Sitting, Cuff Size: Normal)   Pulse 66   Temp 98.6 F (37 C) (Oral)   Resp 12   Ht 4' 10"  (1.473 m)   Wt 138 lb 6.4 oz (62.8 kg)   SpO2 95%   BMI 28.93 kg/m  Wt Readings from Last 3 Encounters:  05/20/17 138 lb 6.4 oz (62.8 kg)  03/27/17 140 lb 4 oz (63.6 kg)  11/18/16 141 lb (64 kg)     Lab Results  Component Value Date   WBC 9.5 05/20/2017   HGB 13.5 05/20/2017   HCT 41.2 05/20/2017   PLT 215.0 05/20/2017   GLUCOSE 102 (H) 05/20/2017   CHOL 172 11/05/2016   TRIG 209.0 (H) 11/05/2016   HDL 49.10  11/05/2016   LDLDIRECT 90.0 11/05/2016   LDLCALC 85 09/15/2015   ALT 42 (H) 05/20/2017   AST 42 (H) 05/20/2017   NA 138 05/20/2017   K 4.4 05/20/2017   CL 100 05/20/2017   CREATININE 1.08 05/20/2017   BUN 23 05/20/2017   CO2 31 05/20/2017   TSH 2.05 06/25/2016  INR 1.0 01/10/2013   HGBA1C 7.5 (H) 05/20/2017   MICROALBUR 5.7 (H) 06/25/2016       Assessment & Plan:   Problem List Items Addressed This Visit    Anemia    Most recent check revealed decreased hgb.  Followed by hematology.  Receiving IV iron infusions.  Recheck cbc today.        Relevant Orders   Ferritin (Completed)   CBC with Differential/Platelet (Completed)   CAD (coronary artery disease)    Followed by cardiology.  Stable.        Carotid arterial disease ([place])    Followed by vascular surgery.  Has been stable.        Diabetes mellitus ([place])    Low carb diet and exercise.  Sugars reviewed.  Sugars averaging 120-140s.  Follow met b and a1c.        Relevant Medications   metFORMIN (GLUCOPHAGE) 500 MG tablet   metFORMIN (GLUCOPHAGE) 500 MG tablet   Other Relevant Orders   Hemoglobin A1c (Completed)   Dizziness    Recent dizziness.  Is better with drinking more fluids.  Blood pressures on checks today as outlined.  Follow pressures.  Follow metabolic panel.  Check cbc and electrolytes.  Hold on further w/up.  Follow.  No chest pain or tightness.        Hypercholesteremia    On crestor.  Low cholesterol diet and exercise.  Follow lipid panel and liver function tests.        Relevant Orders   Hepatic function panel (Completed)   Hypertension - Primary    Blood pressures on checks today 786V systolic.  Hold on making changes in her medications.  Follow pressures.  Follow metabolic panel.        Relevant Orders   Basic metabolic panel (Completed)       Einar Pheasant, MD

## 2017-05-20 NOTE — Progress Notes (Signed)
Pre-visit discussion using our clinic review tool. No additional management support is needed unless otherwise documented below in the visit note.  

## 2017-05-21 ENCOUNTER — Other Ambulatory Visit: Payer: Self-pay | Admitting: Internal Medicine

## 2017-05-21 DIAGNOSIS — R945 Abnormal results of liver function studies: Secondary | ICD-10-CM

## 2017-05-21 DIAGNOSIS — E7800 Pure hypercholesterolemia, unspecified: Secondary | ICD-10-CM

## 2017-05-21 DIAGNOSIS — R7989 Other specified abnormal findings of blood chemistry: Secondary | ICD-10-CM

## 2017-05-21 NOTE — Progress Notes (Signed)
F/u labs ordered.   

## 2017-05-22 ENCOUNTER — Encounter: Payer: Self-pay | Admitting: Internal Medicine

## 2017-05-22 DIAGNOSIS — R42 Dizziness and giddiness: Secondary | ICD-10-CM | POA: Insufficient documentation

## 2017-05-22 NOTE — Assessment & Plan Note (Signed)
Followed by cardiology. Stable.   

## 2017-05-22 NOTE — Assessment & Plan Note (Signed)
On crestor.  Low cholesterol diet and exercise.  Follow lipid panel and liver function tests.   

## 2017-05-22 NOTE — Assessment & Plan Note (Signed)
Blood pressures on checks today 248L systolic.  Hold on making changes in her medications.  Follow pressures.  Follow metabolic panel.

## 2017-05-22 NOTE — Assessment & Plan Note (Signed)
Recent dizziness.  Is better with drinking more fluids.  Blood pressures on checks today as outlined.  Follow pressures.  Follow metabolic panel.  Check cbc and electrolytes.  Hold on further w/up.  Follow.  No chest pain or tightness.

## 2017-05-22 NOTE — Assessment & Plan Note (Signed)
Low carb diet and exercise.  Sugars reviewed.  Sugars averaging 120-140s.  Follow met b and a1c.

## 2017-05-22 NOTE — Assessment & Plan Note (Signed)
Followed by vascular surgery.  Has been stable.

## 2017-05-22 NOTE — Assessment & Plan Note (Signed)
Most recent check revealed decreased hgb.  Followed by hematology.  Receiving IV iron infusions.  Recheck cbc today.

## 2017-05-27 ENCOUNTER — Other Ambulatory Visit: Payer: Self-pay

## 2017-05-28 ENCOUNTER — Other Ambulatory Visit (INDEPENDENT_AMBULATORY_CARE_PROVIDER_SITE_OTHER): Payer: Medicare Other

## 2017-05-28 ENCOUNTER — Telehealth: Payer: Self-pay | Admitting: Internal Medicine

## 2017-05-28 DIAGNOSIS — R7989 Other specified abnormal findings of blood chemistry: Secondary | ICD-10-CM

## 2017-05-28 DIAGNOSIS — E7800 Pure hypercholesterolemia, unspecified: Secondary | ICD-10-CM | POA: Diagnosis not present

## 2017-05-28 DIAGNOSIS — R945 Abnormal results of liver function studies: Secondary | ICD-10-CM

## 2017-05-28 LAB — HEPATIC FUNCTION PANEL
ALT: 30 U/L (ref 0–35)
AST: 25 U/L (ref 0–37)
Albumin: 4.3 g/dL (ref 3.5–5.2)
Alkaline Phosphatase: 68 U/L (ref 39–117)
BILIRUBIN DIRECT: 0.1 mg/dL (ref 0.0–0.3)
BILIRUBIN TOTAL: 0.5 mg/dL (ref 0.2–1.2)
Total Protein: 7.3 g/dL (ref 6.0–8.3)

## 2017-05-28 LAB — LIPID PANEL
CHOL/HDL RATIO: 3
CHOLESTEROL: 168 mg/dL (ref 0–200)
HDL: 67 mg/dL (ref >39.00)
LDL CALC: 72 mg/dL (ref 0–99)
NonHDL: 100.8
TRIGLYCERIDES: 143 mg/dL (ref 0.0–149.0)
VLDL: 28.6 mg/dL (ref 0.0–40.0)

## 2017-05-28 NOTE — Telephone Encounter (Signed)
I have ppw in my yellow folder l/m to call office back need to verify how patient is taking Metformin.

## 2017-05-28 NOTE — Telephone Encounter (Signed)
[facility] has a couple question about pt metFORMIN (GLUCOPHAGE) 500 MG tablet refill. Please advise

## 2017-05-29 NOTE — Telephone Encounter (Signed)
Signed and placed in box.   

## 2017-05-29 NOTE — Telephone Encounter (Signed)
Faxed

## 2017-05-29 NOTE — Telephone Encounter (Signed)
Was able to reach patient she is taking one tab at breakfast only form in you blue folder.

## 2017-06-25 ENCOUNTER — Encounter: Payer: Self-pay | Admitting: Internal Medicine

## 2017-06-25 ENCOUNTER — Ambulatory Visit (INDEPENDENT_AMBULATORY_CARE_PROVIDER_SITE_OTHER): Payer: Medicare Other | Admitting: Internal Medicine

## 2017-06-25 DIAGNOSIS — I739 Peripheral vascular disease, unspecified: Secondary | ICD-10-CM

## 2017-06-25 DIAGNOSIS — I779 Disorder of arteries and arterioles, unspecified: Secondary | ICD-10-CM

## 2017-06-25 DIAGNOSIS — I10 Essential (primary) hypertension: Secondary | ICD-10-CM

## 2017-06-25 DIAGNOSIS — E7800 Pure hypercholesterolemia, unspecified: Secondary | ICD-10-CM

## 2017-06-25 DIAGNOSIS — I2510 Atherosclerotic heart disease of native coronary artery without angina pectoris: Secondary | ICD-10-CM | POA: Diagnosis not present

## 2017-06-25 DIAGNOSIS — M25552 Pain in left hip: Secondary | ICD-10-CM

## 2017-06-25 DIAGNOSIS — G479 Sleep disorder, unspecified: Secondary | ICD-10-CM | POA: Diagnosis not present

## 2017-06-25 DIAGNOSIS — E119 Type 2 diabetes mellitus without complications: Secondary | ICD-10-CM | POA: Diagnosis not present

## 2017-06-25 DIAGNOSIS — D500 Iron deficiency anemia secondary to blood loss (chronic): Secondary | ICD-10-CM | POA: Diagnosis not present

## 2017-06-25 NOTE — Progress Notes (Signed)
Patient ID: Jocelyn Elliott, female   DOB: 1932/07/04, 83 y.o.   MRN: [id number]   Subjective:    Patient ID: Jocelyn Elliott, female    DOB: [birth year], 83 y.o.   MRN: [id number]  HPI  Patient here for a scheduled follow up.  She brings in her blood sugar readings.  AM blood sugars averaging 110-130s.   She feels better.  Saw Dr Marry Guan for hip pain.  S/p injection.  Going to therapy.  Hip is better.  Increased rom.  No chest pain.  Breathing stable.  No headache or dizziness.  Last hgb was wnl.  Has f/u with Dr Rogue Bussing 09/2017.  Has f/u with vascular surgery next month.  Blood pressure has been doing well.     Past Medical History:  Diagnosis Date  . Arthritis   . AV malformation of gastrointestinal tract   . Blood in stool   . CAD (coronary artery disease)   . Carotid arterial disease ([place])   . Cataracts, bilateral   . Chronic blood loss anemia   . Chronic cystitis   . Depression   . Diabetes mellitus ([place])   . GERD (gastroesophageal reflux disease)   . Hyperlipidemia   . Hypertension   . IDA (iron deficiency anemia)   . Stress incontinence    Past Surgical History:  Procedure Laterality Date  . ABDOMINAL HYSTERECTOMY  1972   ovaries left in place  . APPENDECTOMY  1992  . BACK SURGERY  06/08/2010   L3, L4, L5  . CAROTID ARTERY ANGIOPLASTY  11/03  . [place], 89   right then left   . CHOLECYSTECTOMY  1993  . FOOT SURGERY  06/2007   Left  . right carotid artery surgery  01/09/13   Dr. Delana Meyer @ [facility]  . TOTAL HIP ARTHROPLASTY  05/03/2011   left 12, right 11/07   Family History  Problem Relation Age of Onset  . Hyperlipidemia Father   . Heart disease Father        myocardial infarction  . Hypertension Father        Parent  . Arthritis Other        parent  . Diabetes Other        nephew  . Cervical cancer Sister   . Rectal cancer Sister   . Breast cancer Neg Hx    Social History   Social History  . Marital status: Widowed    Spouse  name: N/A  . Number of children: 1  . Years of education: 78   Occupational History  . Retired    Social History Main Topics  . Smoking status: Former Smoker    Types: Cigarettes    Quit date: 11/19/1989  . Smokeless tobacco: Never Used  . Alcohol use No  . Drug use: No  . Sexual activity: Not Asked   Other Topics Concern  . None   Social History Narrative   Regular exercise-no   Caffeine Use-yes    Outpatient Encounter Prescriptions as of 06/25/2017  Medication Sig  . aspirin 81 MG tablet Take 81 mg by mouth daily.  . Biotin 5000 MCG CAPS Take 1 capsule by mouth daily.  . cetirizine (ZYRTEC) 10 MG tablet Take 10 mg by mouth daily.  . metFORMIN (GLUCOPHAGE-XR) 500 MG 24 hr tablet TAKE 1 TABLET TWICE A DAY  . metoprolol succinate (TOPROL-XL) 100 MG 24 hr tablet TAKE 1 TABLET TWICE A DAY  . Omega-3 Fatty Acids (FISH OIL)  1200 MG CAPS Take 1 capsule by mouth daily.  Marland Kitchen omeprazole (PRILOSEC) 20 MG capsule Take 20 mg by mouth daily.  . ramipril (ALTACE) 10 MG capsule Take 1 capsule (10 mg total) by mouth daily.  . rosuvastatin (CRESTOR) 20 MG tablet Take 1 tablet (20 mg total) by mouth daily.  . traZODone (DESYREL) 50 MG tablet TAKE 0.5-1 TABLETS (25-50 MG TOTAL) BY MOUTH AT BEDTIME AS NEEDED FOR SLEEP.  . [DISCONTINUED] metFORMIN (GLUCOPHAGE) 500 MG tablet Take 1 tablet (500 mg total) by mouth daily with breakfast.  . [DISCONTINUED] metFORMIN (GLUCOPHAGE) 500 MG tablet Take 1 tablet (500 mg total) by mouth 2 (two) times daily with a meal.   No facility-administered encounter medications on file as of 06/25/2017.     Review of Systems  Constitutional: Negative for appetite change and unexpected weight change.  HENT: Negative for congestion and sinus pressure.   Respiratory: Negative for cough, chest tightness and shortness of breath.   Cardiovascular: Negative for chest pain, palpitations and leg swelling.  Gastrointestinal: Negative for abdominal pain, diarrhea, nausea and  vomiting.  Genitourinary: Negative for difficulty urinating and dysuria.  Musculoskeletal: Negative for myalgias.       Hip is better.    Skin: Negative for color change and rash.  Neurological: Negative for dizziness, light-headedness and headaches.  Psychiatric/Behavioral: Negative for agitation and dysphoric mood.       Objective:     Blood pressure rechecked by me:  132/68  Physical Exam  Constitutional: She appears well-developed and well-nourished. No distress.  HENT:  Nose: Nose normal.  Mouth/Throat: Oropharynx is clear and moist.  Neck: Neck supple. No thyromegaly present.  Cardiovascular: Normal rate and regular rhythm.   Pulmonary/Chest: Breath sounds normal. No respiratory distress. She has no wheezes.  Abdominal: Soft. Bowel sounds are normal. There is no tenderness.  Musculoskeletal: She exhibits no edema or tenderness.  Lymphadenopathy:    She has no cervical adenopathy.  Skin: No rash noted. No erythema.  Psychiatric: She has a normal mood and affect. Her behavior is normal.    BP 120/72 (BP Location: Left Arm, Patient Position: Sitting, Cuff Size: Normal)   Pulse 72   Temp 98.5 F (36.9 C) (Oral)   Resp 12   Ht _0  (1.473 m)   Wt 140 lb 3.2 oz (63.6 kg)   SpO2 95%   BMI 29.30 kg/m  Wt Readings from Last 3 Encounters:  06/25/17 140 lb 3.2 oz (63.6 kg)  05/20/17 138 lb 6.4 oz (62.8 kg)  03/27/17 140 lb 4 oz (63.6 kg)     Lab Results  Component Value Date   WBC 9.5 05/20/2017   HGB 13.5 05/20/2017   HCT 41.2 05/20/2017   PLT 215.0 05/20/2017   GLUCOSE 102 (H) 05/20/2017   CHOL 168 05/28/2017   TRIG 143.0 05/28/2017   HDL 67.00 05/28/2017   LDLDIRECT 90.0 11/05/2016   LDLCALC 72 05/28/2017   ALT 30 05/28/2017   AST 25 05/28/2017   NA 138 05/20/2017   K 4.4 05/20/2017   CL 100 05/20/2017   CREATININE 1.08 05/20/2017   BUN 23 05/20/2017   CO2 31 05/20/2017   TSH 2.05 06/25/2016   INR 1.0 01/10/2013   HGBA1C 7.5 (H) 05/20/2017    MICROALBUR 5.7 (H) 06/25/2016       Assessment & Plan:   Problem List Items Addressed This Visit    CAD (coronary artery disease)    Followed by cardiology.  Stable.  Carotid arterial disease ([place])    Followed by vascular surgery.  Has been stable.  Has f/u planned next month.        Diabetes mellitus ([place])    Low carb diet and exercise.  Sugars improved.  Follow met b and a1c.        Relevant Orders   Hemoglobin A1c   Microalbumin / creatinine urine ratio   Hip pain    Saw Dr Marry Guan.  S/p injection.  Physical therapy.  Doing better.  Follow.       Hypercholesteremia    On crestor.  Low cholesterol diet and exercise.  Follow lipid panel and liver function tests.        Relevant Orders   Hepatic function panel   Lipid panel   Hypertension    Blood pressure under good control.  Continue same medication regimen.  Follow pressures.  Follow metabolic panel.        Relevant Orders   Basic metabolic panel   TSH   Iron deficiency anemia due to chronic blood loss    Followed by Dr Rogue Bussing.  Last hgb 13.5.  Doing well.  Feels better.  Has f/u planned in 09/2017.        Sleeping difficulty    Trazodone working well.  Follow.            Einar Pheasant, MD

## 2017-06-26 ENCOUNTER — Encounter: Payer: Self-pay | Admitting: Internal Medicine

## 2017-06-26 NOTE — Assessment & Plan Note (Signed)
Followed by vascular surgery.  Has been stable.  Has f/u planned next month.

## 2017-06-26 NOTE — Assessment & Plan Note (Signed)
On crestor.  Low cholesterol diet and exercise.  Follow lipid panel and liver function tests.   

## 2017-06-26 NOTE — Assessment & Plan Note (Signed)
Saw Dr Marry Guan.  S/p injection.  Physical therapy.  Doing better.  Follow.

## 2017-06-26 NOTE — Assessment & Plan Note (Signed)
Trazodone working well.  Follow.

## 2017-06-26 NOTE — Assessment & Plan Note (Signed)
Followed by cardiology. Stable.   

## 2017-06-26 NOTE — Assessment & Plan Note (Signed)
Low carb diet and exercise.  Sugars improved.  Follow met b and a1c.

## 2017-06-26 NOTE — Assessment & Plan Note (Signed)
Followed by Dr Rogue Bussing.  Last hgb 13.5.  Doing well.  Feels better.  Has f/u planned in 09/2017.

## 2017-06-26 NOTE — Assessment & Plan Note (Signed)
Blood pressure under good control.  Continue same medication regimen.  Follow pressures.  Follow metabolic panel.   

## 2017-07-23 ENCOUNTER — Other Ambulatory Visit (INDEPENDENT_AMBULATORY_CARE_PROVIDER_SITE_OTHER): Payer: Self-pay | Admitting: Vascular Surgery

## 2017-07-23 DIAGNOSIS — I739 Peripheral vascular disease, unspecified: Principal | ICD-10-CM

## 2017-07-23 DIAGNOSIS — I779 Disorder of arteries and arterioles, unspecified: Secondary | ICD-10-CM

## 2017-07-25 ENCOUNTER — Ambulatory Visit (INDEPENDENT_AMBULATORY_CARE_PROVIDER_SITE_OTHER): Payer: Medicare Other

## 2017-07-25 ENCOUNTER — Ambulatory Visit (INDEPENDENT_AMBULATORY_CARE_PROVIDER_SITE_OTHER): Payer: Self-pay | Admitting: Vascular Surgery

## 2017-07-25 DIAGNOSIS — I739 Peripheral vascular disease, unspecified: Principal | ICD-10-CM

## 2017-07-25 DIAGNOSIS — I779 Disorder of arteries and arterioles, unspecified: Secondary | ICD-10-CM

## 2017-07-26 ENCOUNTER — Encounter (INDEPENDENT_AMBULATORY_CARE_PROVIDER_SITE_OTHER): Payer: Self-pay | Admitting: Vascular Surgery

## 2017-08-13 ENCOUNTER — Other Ambulatory Visit: Payer: Self-pay | Admitting: Internal Medicine

## 2017-09-05 LAB — HM DIABETES EYE EXAM

## 2017-09-08 ENCOUNTER — Other Ambulatory Visit: Payer: Self-pay | Admitting: Internal Medicine

## 2017-09-19 ENCOUNTER — Other Ambulatory Visit: Payer: Self-pay | Admitting: Internal Medicine

## 2017-09-19 DIAGNOSIS — Z1231 Encounter for screening mammogram for malignant neoplasm of breast: Secondary | ICD-10-CM

## 2017-09-23 ENCOUNTER — Other Ambulatory Visit (INDEPENDENT_AMBULATORY_CARE_PROVIDER_SITE_OTHER): Payer: Medicare Other

## 2017-09-23 DIAGNOSIS — E119 Type 2 diabetes mellitus without complications: Secondary | ICD-10-CM

## 2017-09-23 DIAGNOSIS — I10 Essential (primary) hypertension: Secondary | ICD-10-CM | POA: Diagnosis not present

## 2017-09-23 DIAGNOSIS — E7800 Pure hypercholesterolemia, unspecified: Secondary | ICD-10-CM

## 2017-09-23 LAB — LIPID PANEL
CHOL/HDL RATIO: 3
Cholesterol: 157 mg/dL (ref 0–200)
HDL: 52 mg/dL (ref >39.00)
NONHDL: 104.5
Triglycerides: 202 mg/dL — ABNORMAL HIGH (ref 0.0–149.0)
VLDL: 40.4 mg/dL — ABNORMAL HIGH (ref 0.0–40.0)

## 2017-09-23 LAB — MICROALBUMIN / CREATININE URINE RATIO
Creatinine,U: 30.7 mg/dL
MICROALB UR: 9.2 mg/dL — AB (ref 0.0–1.9)
Microalb Creat Ratio: 30 mg/g (ref 0.0–30.0)

## 2017-09-23 LAB — BASIC METABOLIC PANEL
BUN: 19 mg/dL (ref 6–23)
CHLORIDE: 101 meq/L (ref 96–112)
CO2: 29 mEq/L (ref 19–32)
CREATININE: 1.02 mg/dL (ref 0.40–1.20)
Calcium: 9.7 mg/dL (ref 8.4–10.5)
GFR: 54.97 mL/min — ABNORMAL LOW (ref >60.00)
GLUCOSE: 165 mg/dL — AB (ref 70–99)
Potassium: 4.6 mEq/L (ref 3.5–5.1)
SODIUM: 139 meq/L (ref 135–145)

## 2017-09-23 LAB — LDL CHOLESTEROL, DIRECT: Direct LDL: 80 mg/dL

## 2017-09-23 LAB — HEPATIC FUNCTION PANEL
ALK PHOS: 61 U/L (ref 39–117)
ALT: 21 U/L (ref 0–35)
AST: 20 U/L (ref 0–37)
Albumin: 4 g/dL (ref 3.5–5.2)
BILIRUBIN DIRECT: 0.1 mg/dL (ref 0.0–0.3)
BILIRUBIN TOTAL: 0.5 mg/dL (ref 0.2–1.2)
TOTAL PROTEIN: 6.4 g/dL (ref 6.0–8.3)

## 2017-09-23 LAB — HEMOGLOBIN A1C: Hgb A1c MFr Bld: 7.5 % — ABNORMAL HIGH (ref 4.6–6.5)

## 2017-09-23 LAB — TSH: TSH: 2.25 u[IU]/mL (ref 0.35–4.50)

## 2017-09-25 ENCOUNTER — Inpatient Hospital Stay: Payer: Medicare Other | Attending: Internal Medicine

## 2017-09-25 DIAGNOSIS — K219 Gastro-esophageal reflux disease without esophagitis: Secondary | ICD-10-CM | POA: Insufficient documentation

## 2017-09-25 DIAGNOSIS — I2510 Atherosclerotic heart disease of native coronary artery without angina pectoris: Secondary | ICD-10-CM | POA: Diagnosis not present

## 2017-09-25 DIAGNOSIS — E785 Hyperlipidemia, unspecified: Secondary | ICD-10-CM | POA: Insufficient documentation

## 2017-09-25 DIAGNOSIS — D500 Iron deficiency anemia secondary to blood loss (chronic): Secondary | ICD-10-CM | POA: Insufficient documentation

## 2017-09-25 DIAGNOSIS — Z87891 Personal history of nicotine dependence: Secondary | ICD-10-CM | POA: Insufficient documentation

## 2017-09-25 DIAGNOSIS — F329 Major depressive disorder, single episode, unspecified: Secondary | ICD-10-CM | POA: Diagnosis not present

## 2017-09-25 DIAGNOSIS — Z7984 Long term (current) use of oral hypoglycemic drugs: Secondary | ICD-10-CM | POA: Diagnosis not present

## 2017-09-25 DIAGNOSIS — I10 Essential (primary) hypertension: Secondary | ICD-10-CM | POA: Insufficient documentation

## 2017-09-25 DIAGNOSIS — E119 Type 2 diabetes mellitus without complications: Secondary | ICD-10-CM | POA: Insufficient documentation

## 2017-09-25 DIAGNOSIS — K922 Gastrointestinal hemorrhage, unspecified: Secondary | ICD-10-CM | POA: Insufficient documentation

## 2017-09-25 DIAGNOSIS — Z79899 Other long term (current) drug therapy: Secondary | ICD-10-CM | POA: Diagnosis not present

## 2017-09-25 DIAGNOSIS — Z7982 Long term (current) use of aspirin: Secondary | ICD-10-CM | POA: Insufficient documentation

## 2017-09-25 LAB — CBC WITH DIFFERENTIAL/PLATELET
BASOS ABS: 0.1 10*3/uL (ref 0–0.1)
BASOS PCT: 1 %
Eosinophils Absolute: 0.3 10*3/uL (ref 0–0.7)
Eosinophils Relative: 3 %
HEMATOCRIT: 35.1 % (ref 35.0–47.0)
Hemoglobin: 11.6 g/dL — ABNORMAL LOW (ref 12.0–16.0)
LYMPHS PCT: 29 %
Lymphs Abs: 2.3 10*3/uL (ref 1.0–3.6)
MCH: 29.4 pg (ref 26.0–34.0)
MCHC: 32.9 g/dL (ref 32.0–36.0)
MCV: 89.2 fL (ref 80.0–100.0)
MONO ABS: 0.5 10*3/uL (ref 0.2–0.9)
Monocytes Relative: 7 %
NEUTROS ABS: 4.8 10*3/uL (ref 1.4–6.5)
NEUTROS PCT: 60 %
Platelets: 216 10*3/uL (ref 150–440)
RBC: 3.94 MIL/uL (ref 3.80–5.20)
RDW: 14.5 % (ref 11.5–14.5)
WBC: 8 10*3/uL (ref 3.6–11.0)

## 2017-09-25 LAB — BASIC METABOLIC PANEL
Anion gap: 9 (ref 5–15)
BUN: 22 mg/dL — AB (ref 6–20)
CALCIUM: 9.2 mg/dL (ref 8.9–10.3)
CO2: 24 mmol/L (ref 22–32)
CREATININE: 1.35 mg/dL — AB (ref 0.44–1.00)
Chloride: 104 mmol/L (ref 101–111)
GFR calc Af Amer: 41 mL/min — ABNORMAL LOW (ref >60)
GFR calc non Af Amer: 35 mL/min — ABNORMAL LOW (ref >60)
GLUCOSE: 109 mg/dL — AB (ref 65–99)
Potassium: 4.2 mmol/L (ref 3.5–5.1)
Sodium: 137 mmol/L (ref 135–145)

## 2017-09-25 LAB — IRON AND TIBC
IRON: 48 ug/dL (ref 28–170)
SATURATION RATIOS: 12 % (ref 10.4–31.8)
TIBC: 409 ug/dL (ref 250–450)
UIBC: 361 ug/dL

## 2017-09-25 LAB — FERRITIN: Ferritin: 12 ng/mL (ref 11–307)

## 2017-09-26 ENCOUNTER — Ambulatory Visit: Payer: Medicare Other | Admitting: Internal Medicine

## 2017-09-26 ENCOUNTER — Encounter: Payer: Self-pay | Admitting: Internal Medicine

## 2017-09-26 DIAGNOSIS — E119 Type 2 diabetes mellitus without complications: Secondary | ICD-10-CM

## 2017-09-26 DIAGNOSIS — I2510 Atherosclerotic heart disease of native coronary artery without angina pectoris: Secondary | ICD-10-CM | POA: Diagnosis not present

## 2017-09-26 DIAGNOSIS — I779 Disorder of arteries and arterioles, unspecified: Secondary | ICD-10-CM

## 2017-09-26 DIAGNOSIS — E7800 Pure hypercholesterolemia, unspecified: Secondary | ICD-10-CM

## 2017-09-26 DIAGNOSIS — I10 Essential (primary) hypertension: Secondary | ICD-10-CM

## 2017-09-26 DIAGNOSIS — Z23 Encounter for immunization: Secondary | ICD-10-CM

## 2017-09-26 DIAGNOSIS — I739 Peripheral vascular disease, unspecified: Secondary | ICD-10-CM

## 2017-09-26 DIAGNOSIS — D500 Iron deficiency anemia secondary to blood loss (chronic): Secondary | ICD-10-CM

## 2017-09-26 LAB — HM DIABETES FOOT EXAM

## 2017-09-26 NOTE — Progress Notes (Signed)
Patient ID: Jocelyn Elliott, female   DOB: Nov 06, 1932, 83 y.o.   MRN: [id number]   Subjective:    Patient ID: Jocelyn Elliott, female    DOB: 01/14/1932, 83 y.o.   MRN: [id number]  HPI  Patient here for a scheduled follow up.  She reports she is doing well.  Feels good.  Tries to stay active.  No chest pain.  No sob.  No acid reflux.  No abdominal pain. No GI bleeding.  Sees hematology for iron deficient anemia.  S/p fall in April.  Went to therapy.  Improved.  Walking without significant pain.  Overall feels good.     Past Medical History:  Diagnosis Date  . Arthritis   . AV malformation of gastrointestinal tract   . Blood in stool   . CAD (coronary artery disease)   . Carotid arterial disease ([place])   . Cataracts, bilateral   . Chronic blood loss anemia   . Chronic cystitis   . Depression   . Diabetes mellitus ([place])   . GERD (gastroesophageal reflux disease)   . Hyperlipidemia   . Hypertension   . IDA (iron deficiency anemia)   . Stress incontinence    Past Surgical History:  Procedure Laterality Date  . ABDOMINAL HYSTERECTOMY  1972   ovaries left in place  . APPENDECTOMY  1992  . BACK SURGERY  06/08/2010   L3, L4, L5  . CAROTID ARTERY ANGIOPLASTY  11/03  . [place], 92   right then left   . CHOLECYSTECTOMY  1993  . FOOT SURGERY  06/2007   Left  . right carotid artery surgery  01/09/13   Dr. Delana Meyer @ [facility]  . TOTAL HIP ARTHROPLASTY  05/03/2011   left 12, right 11/07   Family History  Problem Relation Age of Onset  . Hyperlipidemia Father   . Heart disease Father        myocardial infarction  . Hypertension Father        Parent  . Arthritis Other        parent  . Diabetes Other        nephew  . Cervical cancer Sister   . Rectal cancer Sister   . Breast cancer Neg Hx    Social History   Socioeconomic History  . Marital status: Widowed    Spouse name: None  . Number of children: 1  . Years of education: 36  . Highest education  level: None  Social Needs  . Financial resource strain: None  . Food insecurity - worry: None  . Food insecurity - inability: None  . Transportation needs - medical: None  . Transportation needs - non-medical: None  Occupational History  . Occupation: Retired  Tobacco Use  . Smoking status: Former Smoker    Types: Cigarettes    Last attempt to quit: 11/19/1989    Years since quitting: 27.8  . Smokeless tobacco: Never Used  Substance and Sexual Activity  . Alcohol use: No    Alcohol/week: 0.0 oz  . Drug use: No  . Sexual activity: None  Other Topics Concern  . None  Social History Narrative   Regular exercise-no   Caffeine Use-yes    Outpatient Encounter Medications as of 09/26/2017  Medication Sig  . aspirin 81 MG tablet Take 81 mg by mouth daily.  . Biotin 5000 MCG CAPS Take 1 capsule by mouth daily.  . cetirizine (ZYRTEC) 10 MG tablet Take 10 mg by mouth daily.  Marland Kitchen  metFORMIN (GLUCOPHAGE-XR) 500 MG 24 hr tablet TAKE 1 TABLET TWICE A DAY (Patient taking differently: TAKE 1 TABLET DAILY)  . metoprolol succinate (TOPROL-XL) 100 MG 24 hr tablet TAKE 1 TABLET TWICE A DAY  . Omega-3 Fatty Acids (FISH OIL) 1200 MG CAPS Take 1 capsule by mouth daily.  Marland Kitchen omeprazole (PRILOSEC) 20 MG capsule Take 20 mg by mouth daily.  . ramipril (ALTACE) 10 MG capsule Take 1 capsule (10 mg total) by mouth daily.  . rosuvastatin (CRESTOR) 20 MG tablet TAKE 1 TABLET DAILY  . traZODone (DESYREL) 50 MG tablet TAKE 0.5-1 TABLETS (25-50 MG TOTAL) BY MOUTH AT BEDTIME AS NEEDED FOR SLEEP.   No facility-administered encounter medications on file as of 09/26/2017.     Review of Systems  Constitutional: Negative for appetite change and unexpected weight change.  HENT: Negative for congestion and sinus pressure.   Respiratory: Negative for cough, chest tightness and shortness of breath.   Cardiovascular: Negative for chest pain, palpitations and leg swelling.  Gastrointestinal: Negative for abdominal pain,  diarrhea, nausea and vomiting.  Genitourinary: Negative for difficulty urinating and dysuria.  Musculoskeletal: Negative for joint swelling and myalgias.  Skin: Negative for color change and rash.  Neurological: Negative for dizziness, light-headedness and headaches.  Psychiatric/Behavioral: Negative for agitation and dysphoric mood.       Objective:     Blood pressure rechecked by me:  130/68  Physical Exam  Constitutional: She appears well-developed and well-nourished. No distress.  HENT:  Nose: Nose normal.  Mouth/Throat: Oropharynx is clear and moist.  Neck: Neck supple. No thyromegaly present.  Cardiovascular: Normal rate and regular rhythm.  Pulmonary/Chest: Breath sounds normal. No respiratory distress. She has no wheezes.  Abdominal: Soft. Bowel sounds are normal. There is no tenderness.  Musculoskeletal: She exhibits no edema or tenderness.  Feet:  Able to sense light touch.  Some increased sensation to pin prick - starting at ankle.    Lymphadenopathy:    She has no cervical adenopathy.  Skin: No rash noted. No erythema.  Psychiatric: She has a normal mood and affect. Her behavior is normal.    BP 130/68   Pulse 71   Temp (!) 97.5 F (36.4 C) (Oral)   Resp 17   Ht 4' 9.87" (1.47 m)   Wt 142 lb (64.4 kg)   SpO2 95%   BMI 29.81 kg/m  Wt Readings from Last 3 Encounters:  09/27/17 141 lb (64 kg)  09/26/17 142 lb (64.4 kg)  06/25/17 140 lb 3.2 oz (63.6 kg)     Lab Results  Component Value Date   WBC 8.0 09/25/2017   HGB 11.6 (L) 09/25/2017   HCT 35.1 09/25/2017   PLT 216 09/25/2017   GLUCOSE 109 (H) 09/25/2017   CHOL 157 09/23/2017   TRIG 202.0 (H) 09/23/2017   HDL 52.00 09/23/2017   LDLDIRECT 80.0 09/23/2017   LDLCALC 72 05/28/2017   ALT 21 09/23/2017   AST 20 09/23/2017   NA 137 09/25/2017   K 4.2 09/25/2017   CL 104 09/25/2017   CREATININE 1.35 (H) 09/25/2017   BUN 22 (H) 09/25/2017   CO2 24 09/25/2017   TSH 2.25 09/23/2017   INR 1.0  01/10/2013   HGBA1C 7.5 (H) 09/23/2017   MICROALBUR 9.2 (H) 09/23/2017       Assessment & Plan:   Problem List Items Addressed This Visit    CAD (coronary artery disease)    Followed by cardiology.  Stable.  Carotid arterial disease ([place])    Followed by vascular surgery.        Diabetes mellitus ([place])    Low carb diet and exercise.  Follow met b and a1c.  Sugars attached.  Overall doing well.        Relevant Orders   Hemoglobin M0N   Basic metabolic panel   Hypercholesteremia    On crestor.  Low cholesterol diet and exercise.  Follow lipid panel and liver function tests.        Relevant Orders   Hepatic function panel   Lipid panel   Hypertension    Blood pressure under good control.  Continue same medication regimen.  Follow pressures.  Follow metabolic panel.        Iron deficiency anemia due to chronic blood loss    Seeing hematology.  Receives intermittent IV iron infusion.         Other Visit Diagnoses    Encounter for immunization       Relevant Orders   Flu vaccine HIGH DOSE PF (Completed)       Einar Pheasant, MD

## 2017-09-27 ENCOUNTER — Inpatient Hospital Stay (HOSPITAL_BASED_OUTPATIENT_CLINIC_OR_DEPARTMENT_OTHER): Payer: Medicare Other | Admitting: Internal Medicine

## 2017-09-27 ENCOUNTER — Other Ambulatory Visit: Payer: Self-pay

## 2017-09-27 ENCOUNTER — Inpatient Hospital Stay: Payer: Medicare Other

## 2017-09-27 VITALS — BP 136/75 | HR 68 | Temp 97.8°F | Resp 20 | Ht <= 58 in | Wt 141.0 lb

## 2017-09-27 DIAGNOSIS — D500 Iron deficiency anemia secondary to blood loss (chronic): Secondary | ICD-10-CM

## 2017-09-27 DIAGNOSIS — Z79899 Other long term (current) drug therapy: Secondary | ICD-10-CM | POA: Diagnosis not present

## 2017-09-27 DIAGNOSIS — D508 Other iron deficiency anemias: Secondary | ICD-10-CM

## 2017-09-27 MED ORDER — SODIUM CHLORIDE 0.9 % IV SOLN
Freq: Once | INTRAVENOUS | Status: AC
  Administered 2017-09-27: 15:00:00 via INTRAVENOUS
  Filled 2017-09-27: qty 1000

## 2017-09-27 MED ORDER — SODIUM CHLORIDE 0.9 % IV SOLN
510.0000 mg | Freq: Once | INTRAVENOUS | Status: AC
  Administered 2017-09-27: 510 mg via INTRAVENOUS
  Filled 2017-09-27: qty 17

## 2017-09-27 NOTE — Progress Notes (Signed)
[facility] OFFICE PROGRESS NOTE  Patient Care Team: Einar Pheasant, MD as PCP - General (Internal Medicine) Einar Pheasant, MD (Internal Medicine) Leia Alf, MD (Inactive) as Referring Physician (Internal Medicine) Isaias Cowman, MD (Internal Medicine) Albesa Seen, MD (Unknown Physician Specialty) Philis Kendall, MD (Unknown Physician Specialty) Brendolyn Patty, MD (Specialist) Schnier, Dolores Lory, MD (Vascular Surgery) Leia Alf, MD (Inactive) as Referring Physician (Internal Medicine) Isaias Cowman, MD (Internal Medicine) Albesa Seen, MD (Unknown Physician Specialty) Philis Kendall, MD (Unknown Physician Specialty) Brendolyn Patty, MD (Specialist) Schnier, Dolores Lory, MD (Vascular Surgery)   SUMMARY OF HEMATOLOGIC HISTORY:  # IRON DEFICIENCY ANEMIA- recurrent [? GI blood loss; Dr.Elliot]; IV Ferrahem q 74m   INTERVAL HISTORY:   83 -year-old female patient with above history of recurrent iron deficiency anemia needing IV iron.   Patient complains of fatigue especially exertion. Patient denies any blood in stools black colored stools. No weight loss. No chest pain or shortness of breath or cough. No swelling in the legs.  Poor tolerance to p.o. iron.  REVIEW OF SYSTEMS:  A complete 10 point review of system is done which is negative except mentioned above/history of present illness.   PAST MEDICAL HISTORY :  Past Medical History:  Diagnosis Date  . Arthritis   . AV malformation of gastrointestinal tract   . Blood in stool   . CAD (coronary artery disease)   . Carotid arterial disease ([place])   . Cataracts, bilateral   . Chronic blood loss anemia   . Chronic cystitis   . Depression   . Diabetes mellitus ([place])   . GERD (gastroesophageal reflux disease)   . Hyperlipidemia   . Hypertension   . IDA (iron deficiency anemia)   . Stress incontinence     PAST SURGICAL HISTORY :   Past Surgical History:  Procedure Laterality Date  .  ABDOMINAL HYSTERECTOMY  1972   ovaries left in place  . APPENDECTOMY  1992  . BACK SURGERY  06/08/2010   L3, L4, L5  . CAROTID ARTERY ANGIOPLASTY  11/03  . [place], 92   right then left   . CHOLECYSTECTOMY  1993  . FOOT SURGERY  06/2007   Left  . right carotid artery surgery  01/09/13   Dr. Delana Meyer @ [facility]  . TOTAL HIP ARTHROPLASTY  05/03/2011   left 12, right 11/07    FAMILY HISTORY :   Family History  Problem Relation Age of Onset  . Hyperlipidemia Father   . Heart disease Father        myocardial infarction  . Hypertension Father        Parent  . Arthritis Other        parent  . Diabetes Other        nephew  . Cervical cancer Sister   . Rectal cancer Sister   . Breast cancer Neg Hx     SOCIAL HISTORY:   Social History   Tobacco Use  . Smoking status: Former Smoker    Types: Cigarettes    Last attempt to quit: 11/19/1989    Years since quitting: 27.8  . Smokeless tobacco: Never Used  Substance Use Topics  . Alcohol use: No    Alcohol/week: 0.0 oz  . Drug use: No    ALLERGIES:  is allergic to ciprofloxacin; levaquin [levofloxacin]; and tequin [gatifloxacin].  MEDICATIONS:  Current Outpatient Medications  Medication Sig Dispense Refill  . aspirin 81 MG tablet Take 81 mg by mouth daily.    Marland Kitchen  Biotin 5000 MCG CAPS Take 1 capsule by mouth daily.    . cetirizine (ZYRTEC) 10 MG tablet Take 10 mg by mouth daily.    . metFORMIN (GLUCOPHAGE-XR) 500 MG 24 hr tablet TAKE 1 TABLET TWICE A DAY (Patient taking differently: TAKE 1 TABLET DAILY) 180 tablet 3  . metoprolol succinate (TOPROL-XL) 100 MG 24 hr tablet TAKE 1 TABLET TWICE A DAY 180 tablet 1  . Omega-3 Fatty Acids (FISH OIL) 1200 MG CAPS Take 1 capsule by mouth daily.    Marland Kitchen omeprazole (PRILOSEC) 20 MG capsule Take 20 mg by mouth daily.    . ramipril (ALTACE) 10 MG capsule Take 1 capsule (10 mg total) by mouth daily. 90 capsule 3  . rosuvastatin (CRESTOR) 20 MG tablet TAKE 1 TABLET DAILY 90  tablet 2  . traZODone (DESYREL) 50 MG tablet TAKE 0.5-1 TABLETS (25-50 MG TOTAL) BY MOUTH AT BEDTIME AS NEEDED FOR SLEEP. 90 tablet 1   No current facility-administered medications for this visit.     PHYSICAL EXAMINATION:   BP 136/75 (BP Location: Right Arm, Patient Position: Sitting)   Pulse 68   Temp 97.8 F (36.6 C) (Tympanic)   Resp 20   Ht 4' 9.87" (1.47 m)   Wt 141 lb (64 kg)   BMI 29.60 kg/m   Filed Weights   09/27/17 1356  Weight: 141 lb (64 kg)    GENERAL: Well-nourished well-developed; Alert, no distress and comfortable.  Alone.  EYES: no pallor or icterus OROPHARYNX: no thrush or ulceration; good dentition  NECK: supple, no masses felt LYMPH:  no palpable lymphadenopathy in the cervical, axillary or inguinal regions LUNGS: clear to auscultation and  No wheeze or crackles HEART/CVS: regular rate & rhythm and Positive for murmurs; No lower extremity edema ABDOMEN:abdomen soft, non-tender and normal bowel sounds Musculoskeletal:no cyanosis of digits and no clubbing  PSYCH: alert & oriented x 3 with fluent speech NEURO: no focal motor/sensory deficits SKIN:  no rashes or significant lesions  LABORATORY DATA:  I have reviewed the data as listed    Component Value Date/Time   NA 137 09/25/2017 1410   NA 143 01/10/2013 0314   K 4.2 09/25/2017 1410   K 4.0 01/10/2013 0314   CL 104 09/25/2017 1410   CL 109 (H) 01/10/2013 0314   CO2 24 09/25/2017 1410   CO2 27 01/10/2013 0314   GLUCOSE 109 (H) 09/25/2017 1410   GLUCOSE 119 (H) 01/10/2013 0314   BUN 22 (H) 09/25/2017 1410   BUN 15 01/10/2013 0314   CREATININE 1.35 (H) 09/25/2017 1410   CREATININE 1.03 01/10/2013 0314   CREATININE 1.13 (H) 09/18/2012 0848   CALCIUM 9.2 09/25/2017 1410   CALCIUM 8.1 (L) 01/10/2013 0314   PROT 6.4 09/23/2017 0925   PROT 7.2 12/20/2011 1521   ALBUMIN 4.0 09/23/2017 0925   ALBUMIN 3.9 12/20/2011 1521   AST 20 09/23/2017 0925   AST 31 12/20/2011 1521   ALT 21 09/23/2017 0925    ALT 37 12/20/2011 1521   ALKPHOS 61 09/23/2017 0925   ALKPHOS 57 12/20/2011 1521   BILITOT 0.5 09/23/2017 0925   BILITOT 0.5 12/20/2011 1521   GFRNONAA 35 (L) 09/25/2017 1410   GFRNONAA 52 (L) 01/10/2013 0314   GFRNONAA 47 (L) 09/18/2012 0848   GFRAA 41 (L) 09/25/2017 1410   GFRAA >60 01/10/2013 0314   GFRAA 54 (L) 09/18/2012 0848    No results found for: SPEP, UPEP  Lab Results  Component Value Date   WBC 8.0  09/25/2017   NEUTROABS 4.8 09/25/2017   HGB 11.6 (L) 09/25/2017   HCT 35.1 09/25/2017   MCV 89.2 09/25/2017   PLT 216 09/25/2017      Chemistry      Component Value Date/Time   NA 137 09/25/2017 1410   NA 143 01/10/2013 0314   K 4.2 09/25/2017 1410   K 4.0 01/10/2013 0314   CL 104 09/25/2017 1410   CL 109 (H) 01/10/2013 0314   CO2 24 09/25/2017 1410   CO2 27 01/10/2013 0314   BUN 22 (H) 09/25/2017 1410   BUN 15 01/10/2013 0314   CREATININE 1.35 (H) 09/25/2017 1410   CREATININE 1.03 01/10/2013 0314   CREATININE 1.13 (H) 09/18/2012 0848      Component Value Date/Time   CALCIUM 9.2 09/25/2017 1410   CALCIUM 8.1 (L) 01/10/2013 0314   ALKPHOS 61 09/23/2017 0925   ALKPHOS 57 12/20/2011 1521   AST 20 09/23/2017 0925   AST 31 12/20/2011 1521   ALT 21 09/23/2017 0925   ALT 37 12/20/2011 1521   BILITOT 0.5 09/23/2017 0925   BILITOT 0.5 12/20/2011 1521         ASSESSMENT & PLAN:   Iron deficiency anemia due to chronic blood loss # -Recurrent iron deficiency anemia- Today hemoglobin is 11.6  MCV is 89. Sat-12%. Recommend IV ferrahem today.   # patient will follow-up with labs/possible IV iron in 6 months; and again with me with labs possible IV iron 6 months.      Cammie Sickle, MD 10/01/2017 6:59 PM

## 2017-09-27 NOTE — Assessment & Plan Note (Signed)
# -  Recurrent iron deficiency anemia- Today hemoglobin is 11.6  MCV is 89. Sat-12%. Recommend IV ferrahem today.   # patient will follow-up with labs/possible IV iron in 6 months; and again with me with labs possible IV iron 6 months.

## 2017-09-29 ENCOUNTER — Encounter: Payer: Self-pay | Admitting: Internal Medicine

## 2017-09-29 NOTE — Assessment & Plan Note (Signed)
Seeing hematology.  Receives intermittent IV iron infusion.

## 2017-09-29 NOTE — Assessment & Plan Note (Addendum)
Low carb diet and exercise.  Follow met b and a1c.  Sugars attached.  Overall doing well.

## 2017-09-29 NOTE — Assessment & Plan Note (Signed)
Followed by vascular surgery. 

## 2017-09-29 NOTE — Assessment & Plan Note (Signed)
Followed by cardiology. Stable.   

## 2017-09-29 NOTE — Assessment & Plan Note (Signed)
On crestor.  Low cholesterol diet and exercise.  Follow lipid panel and liver function tests.   

## 2017-09-29 NOTE — Assessment & Plan Note (Signed)
Blood pressure under good control.  Continue same medication regimen.  Follow pressures.  Follow metabolic panel.   

## 2017-10-04 ENCOUNTER — Other Ambulatory Visit: Payer: Self-pay | Admitting: Internal Medicine

## 2017-11-08 ENCOUNTER — Ambulatory Visit: Payer: Medicare Other

## 2018-01-07 DIAGNOSIS — M533 Sacrococcygeal disorders, not elsewhere classified: Secondary | ICD-10-CM | POA: Insufficient documentation

## 2018-01-27 ENCOUNTER — Other Ambulatory Visit (INDEPENDENT_AMBULATORY_CARE_PROVIDER_SITE_OTHER): Payer: Medicare Other

## 2018-01-27 DIAGNOSIS — E7800 Pure hypercholesterolemia, unspecified: Secondary | ICD-10-CM | POA: Diagnosis not present

## 2018-01-27 DIAGNOSIS — E119 Type 2 diabetes mellitus without complications: Secondary | ICD-10-CM | POA: Diagnosis not present

## 2018-01-27 LAB — LIPID PANEL
CHOL/HDL RATIO: 3
CHOLESTEROL: 146 mg/dL (ref 0–200)
HDL: 43.9 mg/dL (ref >39.00)
NonHDL: 102.54
Triglycerides: 225 mg/dL — ABNORMAL HIGH (ref 0.0–149.0)
VLDL: 45 mg/dL — ABNORMAL HIGH (ref 0.0–40.0)

## 2018-01-27 LAB — BASIC METABOLIC PANEL
BUN: 22 mg/dL (ref 6–23)
CHLORIDE: 102 meq/L (ref 96–112)
CO2: 28 mEq/L (ref 19–32)
CREATININE: 1.08 mg/dL (ref 0.40–1.20)
Calcium: 9.7 mg/dL (ref 8.4–10.5)
GFR: 51.42 mL/min — ABNORMAL LOW (ref >60.00)
GLUCOSE: 153 mg/dL — AB (ref 70–99)
POTASSIUM: 4.3 meq/L (ref 3.5–5.1)
Sodium: 138 mEq/L (ref 135–145)

## 2018-01-27 LAB — LDL CHOLESTEROL, DIRECT: LDL DIRECT: 70 mg/dL

## 2018-01-27 LAB — HEPATIC FUNCTION PANEL
ALBUMIN: 4 g/dL (ref 3.5–5.2)
ALT: 46 U/L — AB (ref 0–35)
AST: 39 U/L — AB (ref 0–37)
Alkaline Phosphatase: 60 U/L (ref 39–117)
BILIRUBIN TOTAL: 0.5 mg/dL (ref 0.2–1.2)
Bilirubin, Direct: 0.1 mg/dL (ref 0.0–0.3)
Total Protein: 7.1 g/dL (ref 6.0–8.3)

## 2018-01-27 LAB — HEMOGLOBIN A1C: HEMOGLOBIN A1C: 8.3 % — AB (ref 4.6–6.5)

## 2018-01-29 ENCOUNTER — Ambulatory Visit: Payer: Medicare Other | Admitting: Internal Medicine

## 2018-01-29 ENCOUNTER — Encounter: Payer: Self-pay | Admitting: Internal Medicine

## 2018-01-29 VITALS — BP 132/70 | HR 73 | Temp 97.5°F | Resp 20 | Ht <= 58 in | Wt 143.6 lb

## 2018-01-29 DIAGNOSIS — I779 Disorder of arteries and arterioles, unspecified: Secondary | ICD-10-CM | POA: Diagnosis not present

## 2018-01-29 DIAGNOSIS — M25552 Pain in left hip: Secondary | ICD-10-CM | POA: Diagnosis not present

## 2018-01-29 DIAGNOSIS — I739 Peripheral vascular disease, unspecified: Secondary | ICD-10-CM

## 2018-01-29 DIAGNOSIS — I10 Essential (primary) hypertension: Secondary | ICD-10-CM

## 2018-01-29 DIAGNOSIS — E7800 Pure hypercholesterolemia, unspecified: Secondary | ICD-10-CM

## 2018-01-29 DIAGNOSIS — E119 Type 2 diabetes mellitus without complications: Secondary | ICD-10-CM | POA: Diagnosis not present

## 2018-01-29 DIAGNOSIS — R945 Abnormal results of liver function studies: Secondary | ICD-10-CM | POA: Diagnosis not present

## 2018-01-29 DIAGNOSIS — I2510 Atherosclerotic heart disease of native coronary artery without angina pectoris: Secondary | ICD-10-CM | POA: Diagnosis not present

## 2018-01-29 DIAGNOSIS — Z0000 Encounter for general adult medical examination without abnormal findings: Secondary | ICD-10-CM

## 2018-01-29 DIAGNOSIS — D649 Anemia, unspecified: Secondary | ICD-10-CM | POA: Diagnosis not present

## 2018-01-29 DIAGNOSIS — R7989 Other specified abnormal findings of blood chemistry: Secondary | ICD-10-CM

## 2018-01-29 LAB — HEPATIC FUNCTION PANEL
ALBUMIN: 4.1 g/dL (ref 3.5–5.2)
ALT: 40 U/L — ABNORMAL HIGH (ref 0–35)
AST: 35 U/L (ref 0–37)
Alkaline Phosphatase: 64 U/L (ref 39–117)
BILIRUBIN DIRECT: 0.1 mg/dL (ref 0.0–0.3)
Total Bilirubin: 0.5 mg/dL (ref 0.2–1.2)
Total Protein: 7 g/dL (ref 6.0–8.3)

## 2018-01-29 MED ORDER — NYSTATIN 100000 UNIT/GM EX CREA: 1.0000 "application " | TOPICAL_CREAM | Freq: Two times a day (BID) | CUTANEOUS | 0 refills | Status: DC

## 2018-01-29 NOTE — Assessment & Plan Note (Signed)
Physical today 01/29/18.  Schedule mammogram.  Missed her appt in 10/2017.

## 2018-01-29 NOTE — Progress Notes (Signed)
Patient ID: Jocelyn Elliott, female   DOB: [birth year], 84 y.o.   MRN: [id number]   Subjective:    Patient ID: Jocelyn Elliott, female    DOB: 1932/06/06, 84 y.o.   MRN: [id number]  HPI  Patient here for her physical exam.  She had been having problems with left hip pain.  Saw orthoh.  Was given meloxicam. Doing better now.  Not taking meloxicam.  No chest pain.  No sob.  No acid reflux.  No abdominal pain.  Bowels moving.  Saw cardiology 10/2017.  Stable.  Recommended f/u in 4 months.  Receives IV iron prn.  Discussed recent labs.  Discussed diet and exercise.  States she has been eating more sweets.     Past Medical History:  Diagnosis Date  . Arthritis   . AV malformation of gastrointestinal tract   . Blood in stool   . CAD (coronary artery disease)   . Carotid arterial disease ([place])   . Cataracts, bilateral   . Chronic blood loss anemia   . Chronic cystitis   . Depression   . Diabetes mellitus ([place])   . GERD (gastroesophageal reflux disease)   . Hyperlipidemia   . Hypertension   . IDA (iron deficiency anemia)   . Stress incontinence    Past Surgical History:  Procedure Laterality Date  . ABDOMINAL HYSTERECTOMY  1972   ovaries left in place  . APPENDECTOMY  1992  . BACK SURGERY  06/08/2010   L3, L4, L5  . CAROTID ARTERY ANGIOPLASTY  11/03  . [place], 92   right then left   . CHOLECYSTECTOMY  1993  . FOOT SURGERY  06/2007   Left  . right carotid artery surgery  01/09/13   Dr. Delana Meyer @ [facility]  . TOTAL HIP ARTHROPLASTY  05/03/2011   left 12, right 11/07   Family History  Problem Relation Age of Onset  . Hyperlipidemia Father   . Heart disease Father        myocardial infarction  . Hypertension Father        Parent  . Arthritis Other        parent  . Diabetes Other        nephew  . Cervical cancer Sister   . Rectal cancer Sister   . Breast cancer Neg Hx    Social History   Socioeconomic History  . Marital status: Widowed    Spouse  name: None  . Number of children: 1  . Years of education: 27  . Highest education level: None  Social Needs  . Financial resource strain: None  . Food insecurity - worry: None  . Food insecurity - inability: None  . Transportation needs - medical: None  . Transportation needs - non-medical: None  Occupational History  . Occupation: Retired  Tobacco Use  . Smoking status: Former Smoker    Types: Cigarettes    Last attempt to quit: 11/19/1989    Years since quitting: 28.2  . Smokeless tobacco: Never Used  Substance and Sexual Activity  . Alcohol use: No    Alcohol/week: 0.0 oz  . Drug use: No  . Sexual activity: None  Other Topics Concern  . None  Social History Narrative   Regular exercise-no   Caffeine Use-yes    Outpatient Encounter Medications as of 01/29/2018  Medication Sig  . aspirin 81 MG tablet Take 81 mg by mouth daily.  . Biotin 5000 MCG CAPS Take 1 capsule by mouth  daily.  . cetirizine (ZYRTEC) 10 MG tablet Take 10 mg by mouth daily.  . metFORMIN (GLUCOPHAGE-XR) 500 MG 24 hr tablet TAKE 1 TABLET TWICE A DAY (Patient taking differently: TAKE 1 TABLET DAILY)  . metoprolol succinate (TOPROL-XL) 100 MG 24 hr tablet TAKE 1 TABLET TWICE A DAY  . nystatin cream (MYCOSTATIN) Apply 1 application topically 2 (two) times daily.  . Omega-3 Fatty Acids (FISH OIL) 1200 MG CAPS Take 1 capsule by mouth daily.  Marland Kitchen omeprazole (PRILOSEC) 20 MG capsule Take 20 mg by mouth daily.  . ramipril (ALTACE) 10 MG capsule Take 1 capsule (10 mg total) by mouth daily.  . rosuvastatin (CRESTOR) 20 MG tablet TAKE 1 TABLET DAILY  . traZODone (DESYREL) 50 MG tablet TAKE 0.5-1 TABLETS (25-50 MG TOTAL) BY MOUTH AT BEDTIME AS NEEDED FOR SLEEP.   No facility-administered encounter medications on file as of 01/29/2018.     Review of Systems  Constitutional: Negative for appetite change and unexpected weight change.  HENT: Negative for congestion and sinus pressure.   Eyes: Negative for pain and  visual disturbance.  Respiratory: Negative for cough, chest tightness and shortness of breath.   Cardiovascular: Negative for chest pain, palpitations and leg swelling.  Gastrointestinal: Negative for abdominal pain, diarrhea, nausea and vomiting.  Genitourinary: Negative for difficulty urinating and dysuria.  Musculoskeletal: Negative for joint swelling and myalgias.  Skin: Negative for color change and rash.  Neurological: Negative for dizziness, light-headedness and headaches.  Hematological: Negative for adenopathy. Does not bruise/bleed easily.  Psychiatric/Behavioral: Negative for agitation and dysphoric mood.       Objective:     Blood pressure rechecked by me:  132/70  Physical Exam  Constitutional: She is oriented to person, place, and time. She appears well-developed and well-nourished. No distress.  HENT:  Nose: Nose normal.  Mouth/Throat: Oropharynx is clear and moist.  Eyes: Right eye exhibits no discharge. Left eye exhibits no discharge. No scleral icterus.  Neck: Neck supple. No thyromegaly present.  Cardiovascular: Normal rate and regular rhythm.  Pulmonary/Chest: Breath sounds normal. No accessory muscle usage. No tachypnea. No respiratory distress. She has no decreased breath sounds. She has no wheezes. She has no rhonchi. Right breast exhibits no inverted nipple, no mass, no nipple discharge and no tenderness (no axillary adenopathy). Left breast exhibits no inverted nipple, no mass, no nipple discharge and no tenderness (no axilarry adenopathy).  Abdominal: Soft. Bowel sounds are normal. There is no tenderness.  Musculoskeletal: She exhibits no edema or tenderness.  Lymphadenopathy:    She has no cervical adenopathy.  Neurological: She is alert and oriented to person, place, and time.  Skin: Skin is warm. No rash noted. No erythema.  Psychiatric: She has a normal mood and affect. Her behavior is normal.    BP 132/70   Pulse 73   Temp (!) 97.5 F (36.4 C)  (Oral)   Resp 20   Ht 4\' 10"  (1.473 m)   Wt 143 lb 9.6 oz (65.1 kg)   SpO2 95%   BMI 30.01 kg/m  Wt Readings from Last 3 Encounters:  01/29/18 143 lb 9.6 oz (65.1 kg)  09/27/17 141 lb (64 kg)  09/26/17 142 lb (64.4 kg)     Lab Results  Component Value Date   WBC 8.0 09/25/2017   HGB 11.6 (L) 09/25/2017   HCT 35.1 09/25/2017   PLT 216 09/25/2017   GLUCOSE 153 (H) 01/27/2018   CHOL 146 01/27/2018   TRIG 225.0 (H) 01/27/2018  HDL 43.90 01/27/2018   LDLDIRECT 70.0 01/27/2018   LDLCALC 72 05/28/2017   ALT 40 (H) 01/29/2018   AST 35 01/29/2018   NA 138 01/27/2018   K 4.3 01/27/2018   CL 102 01/27/2018   CREATININE 1.08 01/27/2018   BUN 22 01/27/2018   CO2 28 01/27/2018   TSH 2.25 09/23/2017   INR 1.0 01/10/2013   HGBA1C 8.3 (H) 01/27/2018   MICROALBUR 9.2 (H) 09/23/2017       Assessment & Plan:   Problem List Items Addressed This Visit    Anemia    Followed by hematology.  They are following her cbc.       CAD (coronary artery disease)    Followed by cardiology.  Stable.        Carotid arterial disease ([place])    Followed by vascular surgery.        Diabetes mellitus ([place])    Low carb diet and exercise.  Follow.        Health care maintenance    Physical today 01/29/18.  Schedule mammogram.  Missed her appt in 10/2017.        Hip pain    Saw ortho.  Doing better.  Follow.        Hypercholesteremia    On crestor.   Low cholesterol diet and exercise.  Follow lipid panel and liver function tests.        Hypertension    Blood pressure on recheck looks good.  Continue same medications.  Follow.         Other Visit Diagnoses    Routine general medical examination at a health care facility    -  Primary   Abnormal liver function tests       Relevant Orders   Hepatic function panel (Completed)       Einar Pheasant, MD

## 2018-01-30 ENCOUNTER — Other Ambulatory Visit: Payer: Self-pay | Admitting: Internal Medicine

## 2018-01-30 DIAGNOSIS — R945 Abnormal results of liver function studies: Secondary | ICD-10-CM

## 2018-01-30 DIAGNOSIS — R7989 Other specified abnormal findings of blood chemistry: Secondary | ICD-10-CM

## 2018-01-30 NOTE — Progress Notes (Signed)
Order placed for f/u liver panel.  

## 2018-02-01 ENCOUNTER — Encounter: Payer: Self-pay | Admitting: Internal Medicine

## 2018-02-01 NOTE — Assessment & Plan Note (Signed)
Blood pressure on recheck looks good.  Continue same medications.  Follow.

## 2018-02-01 NOTE — Assessment & Plan Note (Signed)
On crestor.  Low cholesterol diet and exercise.  Follow lipid panel and liver function tests.   

## 2018-02-01 NOTE — Assessment & Plan Note (Signed)
Followed by hematology.  They are following her cbc.

## 2018-02-01 NOTE — Assessment & Plan Note (Signed)
Low carb diet and exercise.  Follow.  

## 2018-02-01 NOTE — Assessment & Plan Note (Signed)
Followed by vascular surgery. 

## 2018-02-01 NOTE — Assessment & Plan Note (Signed)
Saw ortho.  Doing better.  Follow.

## 2018-02-01 NOTE — Assessment & Plan Note (Signed)
Followed by cardiology. Stable.   

## 2018-02-03 ENCOUNTER — Encounter: Payer: Self-pay | Admitting: *Deleted

## 2018-02-09 ENCOUNTER — Other Ambulatory Visit: Payer: Self-pay | Admitting: Internal Medicine

## 2018-02-14 ENCOUNTER — Ambulatory Visit
Admission: RE | Admit: 2018-02-14 | Discharge: 2018-02-14 | Disposition: A | Discharge status: Home / Self Care | Payer: Medicare Other | Source: Ambulatory Visit | Attending: Internal Medicine | Admitting: Internal Medicine

## 2018-02-14 DIAGNOSIS — Z1231 Encounter for screening mammogram for malignant neoplasm of breast: Secondary | ICD-10-CM | POA: Insufficient documentation

## 2018-03-05 ENCOUNTER — Other Ambulatory Visit (INDEPENDENT_AMBULATORY_CARE_PROVIDER_SITE_OTHER): Payer: Medicare Other

## 2018-03-05 DIAGNOSIS — R945 Abnormal results of liver function studies: Secondary | ICD-10-CM

## 2018-03-05 DIAGNOSIS — R7989 Other specified abnormal findings of blood chemistry: Secondary | ICD-10-CM

## 2018-03-05 LAB — HEPATIC FUNCTION PANEL
ALT: 28 U/L (ref 0–35)
AST: 29 U/L (ref 0–37)
Albumin: 3.9 g/dL (ref 3.5–5.2)
Alkaline Phosphatase: 60 U/L (ref 39–117)
BILIRUBIN TOTAL: 0.5 mg/dL (ref 0.2–1.2)
Bilirubin, Direct: 0.1 mg/dL (ref 0.0–0.3)
TOTAL PROTEIN: 6.9 g/dL (ref 6.0–8.3)

## 2018-03-26 ENCOUNTER — Inpatient Hospital Stay: Payer: Medicare Other | Attending: Internal Medicine

## 2018-03-26 DIAGNOSIS — I10 Essential (primary) hypertension: Secondary | ICD-10-CM | POA: Diagnosis not present

## 2018-03-26 DIAGNOSIS — D500 Iron deficiency anemia secondary to blood loss (chronic): Secondary | ICD-10-CM

## 2018-03-26 DIAGNOSIS — E119 Type 2 diabetes mellitus without complications: Secondary | ICD-10-CM | POA: Diagnosis not present

## 2018-03-26 DIAGNOSIS — D509 Iron deficiency anemia, unspecified: Secondary | ICD-10-CM | POA: Insufficient documentation

## 2018-03-26 LAB — CBC WITH DIFFERENTIAL/PLATELET
BASOS ABS: 0.1 10*3/uL (ref 0–0.1)
BASOS PCT: 1 %
EOS ABS: 0.2 10*3/uL (ref 0–0.7)
EOS PCT: 3 %
HCT: 33.1 % — ABNORMAL LOW (ref 35.0–47.0)
Hemoglobin: 10.9 g/dL — ABNORMAL LOW (ref 12.0–16.0)
Lymphocytes Relative: 28 %
Lymphs Abs: 2.2 10*3/uL (ref 1.0–3.6)
MCH: 28.7 pg (ref 26.0–34.0)
MCHC: 33 g/dL (ref 32.0–36.0)
MCV: 86.9 fL (ref 80.0–100.0)
MONOS PCT: 6 %
Monocytes Absolute: 0.5 10*3/uL (ref 0.2–0.9)
Neutro Abs: 5.1 10*3/uL (ref 1.4–6.5)
Neutrophils Relative %: 62 %
PLATELETS: 268 10*3/uL (ref 150–440)
RBC: 3.81 MIL/uL (ref 3.80–5.20)
RDW: 14.7 % — AB (ref 11.5–14.5)
WBC: 8.1 10*3/uL (ref 3.6–11.0)

## 2018-03-26 LAB — BASIC METABOLIC PANEL
Anion gap: 9 (ref 5–15)
BUN: 24 mg/dL — ABNORMAL HIGH (ref 6–20)
CO2: 24 mmol/L (ref 22–32)
Calcium: 9.2 mg/dL (ref 8.9–10.3)
Chloride: 105 mmol/L (ref 101–111)
Creatinine, Ser: 1.22 mg/dL — ABNORMAL HIGH (ref 0.44–1.00)
GFR calc Af Amer: 46 mL/min — ABNORMAL LOW (ref >60)
GFR calc non Af Amer: 40 mL/min — ABNORMAL LOW (ref >60)
Glucose, Bld: 145 mg/dL — ABNORMAL HIGH (ref 65–99)
Potassium: 4.6 mmol/L (ref 3.5–5.1)
Sodium: 138 mmol/L (ref 135–145)

## 2018-03-26 LAB — IRON AND TIBC
Iron: 31 ug/dL (ref 28–170)
SATURATION RATIOS: 7 % — AB (ref 10.4–31.8)
TIBC: 456 ug/dL — AB (ref 250–450)
UIBC: 425 ug/dL

## 2018-03-26 LAB — FERRITIN: FERRITIN: 8 ng/mL — AB (ref 11–307)

## 2018-03-28 ENCOUNTER — Inpatient Hospital Stay (HOSPITAL_BASED_OUTPATIENT_CLINIC_OR_DEPARTMENT_OTHER): Payer: Medicare Other | Admitting: Internal Medicine

## 2018-03-28 ENCOUNTER — Inpatient Hospital Stay: Payer: Medicare Other

## 2018-03-28 VITALS — BP 132/68 | HR 64 | Temp 97.8°F | Resp 16 | Wt 141.6 lb

## 2018-03-28 VITALS — BP 147/71 | HR 67 | Temp 96.5°F | Resp 17

## 2018-03-28 DIAGNOSIS — I10 Essential (primary) hypertension: Secondary | ICD-10-CM | POA: Diagnosis not present

## 2018-03-28 DIAGNOSIS — D509 Iron deficiency anemia, unspecified: Secondary | ICD-10-CM

## 2018-03-28 DIAGNOSIS — D500 Iron deficiency anemia secondary to blood loss (chronic): Secondary | ICD-10-CM

## 2018-03-28 DIAGNOSIS — E119 Type 2 diabetes mellitus without complications: Secondary | ICD-10-CM

## 2018-03-28 DIAGNOSIS — D508 Other iron deficiency anemias: Secondary | ICD-10-CM

## 2018-03-28 MED ORDER — SODIUM CHLORIDE 0.9 % IV SOLN
Freq: Once | INTRAVENOUS | Status: AC
  Administered 2018-03-28: 12:00:00 via INTRAVENOUS
  Filled 2018-03-28: qty 1000

## 2018-03-28 MED ORDER — SODIUM CHLORIDE 0.9 % IV SOLN
510.0000 mg | Freq: Once | INTRAVENOUS | Status: AC
  Administered 2018-03-28: 510 mg via INTRAVENOUS
  Filled 2018-03-28: qty 17

## 2018-03-28 NOTE — Progress Notes (Signed)
[facility] OFFICE PROGRESS NOTE  Patient Care Team: Einar Pheasant, MD as PCP - General (Internal Medicine) Einar Pheasant, MD (Internal Medicine) Leia Alf, MD (Inactive) as Referring Physician (Internal Medicine) Isaias Cowman, MD (Internal Medicine) Albesa Seen, MD (Unknown Physician Specialty) Philis Kendall, MD (Unknown Physician Specialty) Brendolyn Patty, MD (Specialist) Schnier, Dolores Lory, MD (Vascular Surgery) Leia Alf, MD (Inactive) as Referring Physician (Internal Medicine) Isaias Cowman, MD (Internal Medicine) Albesa Seen, MD (Unknown Physician Specialty) Philis Kendall, MD (Unknown Physician Specialty) Brendolyn Patty, MD (Specialist) Schnier, Dolores Lory, MD (Vascular Surgery)   SUMMARY OF HEMATOLOGIC HISTORY:  # IRON DEFICIENCY ANEMIA- recurrent [? GI blood loss; Dr.Elliot; AVM-capsule]; IV Ferrahem q 62m   INTERVAL HISTORY:   84 -year-old female patient with above history of recurrent iron deficiency anemia needing IV iron.   Continues to complain of chronic fatigue.  Complains of shortness of breath on exertion.  No cough.  No swelling in the legs. Patient is not on iron secondary tolerance.  Review of Systems  Constitutional: Positive for malaise/fatigue. Negative for chills, diaphoresis, fever and weight loss.  HENT: Negative for nosebleeds and sore throat.   Eyes: Negative for double vision.  Respiratory: Positive for shortness of breath. Negative for cough, hemoptysis, sputum production and wheezing.   Cardiovascular: Negative for chest pain, palpitations, orthopnea and leg swelling.  Gastrointestinal: Negative for abdominal pain, blood in stool, constipation, diarrhea, heartburn, melena, nausea and vomiting.  Genitourinary: Negative for dysuria, frequency and urgency.  Musculoskeletal: Negative for back pain and joint pain.  Skin: Negative.  Negative for itching and rash.  Neurological: Negative for dizziness,  tingling, focal weakness, weakness and headaches.  Endo/Heme/Allergies: Does not bruise/bleed easily.  Psychiatric/Behavioral: Negative for depression. The patient is not nervous/anxious and does not have insomnia.      PAST MEDICAL HISTORY :  Past Medical History:  Diagnosis Date  . Arthritis   . AV malformation of gastrointestinal tract   . Blood in stool   . CAD (coronary artery disease)   . Carotid arterial disease ([place])   . Cataracts, bilateral   . Chronic blood loss anemia   . Chronic cystitis   . Depression   . Diabetes mellitus ([place])   . GERD (gastroesophageal reflux disease)   . Hyperlipidemia   . Hypertension   . IDA (iron deficiency anemia)   . Stress incontinence     PAST SURGICAL HISTORY :   Past Surgical History:  Procedure Laterality Date  . ABDOMINAL HYSTERECTOMY  1972   ovaries left in place  . APPENDECTOMY  1992  . BACK SURGERY  06/08/2010   L3, L4, L5  . CAROTID ARTERY ANGIOPLASTY  11/03  . [place], 92   right then left   . CHOLECYSTECTOMY  1993  . FOOT SURGERY  06/2007   Left  . right carotid artery surgery  01/09/13   Dr. Delana Meyer @ [facility]  . TOTAL HIP ARTHROPLASTY  05/03/2011   left 12, right 11/07    FAMILY HISTORY :   Family History  Problem Relation Age of Onset  . Hyperlipidemia Father   . Heart disease Father        myocardial infarction  . Hypertension Father        Parent  . Arthritis Other        parent  . Diabetes Other        nephew  . Cervical cancer Sister   . Rectal cancer Sister   . Breast  cancer Neg Hx     SOCIAL HISTORY:   Social History   Tobacco Use  . Smoking status: Former Smoker    Types: Cigarettes    Last attempt to quit: 11/19/1989    Years since quitting: 28.3  . Smokeless tobacco: Never Used  Substance Use Topics  . Alcohol use: No    Alcohol/week: 0.0 oz  . Drug use: No    ALLERGIES:  is allergic to ciprofloxacin; levaquin [levofloxacin]; and tequin  [gatifloxacin].  MEDICATIONS:  Current Outpatient Medications  Medication Sig Dispense Refill  . aspirin 81 MG tablet Take 81 mg by mouth daily.    . Biotin 5000 MCG CAPS Take 1 capsule by mouth daily.    . cetirizine (ZYRTEC) 10 MG tablet Take 10 mg by mouth daily.    . metFORMIN (GLUCOPHAGE-XR) 500 MG 24 hr tablet TAKE 1 TABLET TWICE A DAY (Patient taking differently: TAKE 1 TABLET DAILY) 180 tablet 3  . metoprolol succinate (TOPROL-XL) 100 MG 24 hr tablet TAKE 1 TABLET TWICE A DAY 180 tablet 1  . nystatin cream (MYCOSTATIN) Apply 1 application topically 2 (two) times daily. 30 g 0  . Omega-3 Fatty Acids (FISH OIL) 1200 MG CAPS Take 1 capsule by mouth daily.    Marland Kitchen omeprazole (PRILOSEC) 20 MG capsule Take 20 mg by mouth daily.    . ramipril (ALTACE) 10 MG capsule Take 1 capsule (10 mg total) by mouth daily. 90 capsule 3  . rosuvastatin (CRESTOR) 20 MG tablet TAKE 1 TABLET DAILY 90 tablet 2  . traZODone (DESYREL) 50 MG tablet TAKE 0.5-1 TABLETS (25-50 MG TOTAL) BY MOUTH AT BEDTIME AS NEEDED FOR SLEEP. 90 tablet 1   No current facility-administered medications for this visit.     PHYSICAL EXAMINATION:   BP 132/68 (BP Location: Left Arm, Patient Position: Sitting)   Pulse 64   Temp 97.8 F (36.6 C) (Tympanic)   Resp 16   Wt 141 lb 9.6 oz (64.2 kg)   BMI 29.59 kg/m   Filed Weights   03/28/18 1111 03/28/18 1112  Weight: 141 lb 9.6 oz (64.2 kg) 141 lb 9.6 oz (64.2 kg)    GENERAL: Well-nourished well-developed; Alert, no distress and comfortable.  Alone.  EYES: no pallor or icterus OROPHARYNX: no thrush or ulceration; good dentition  NECK: supple, no masses felt LYMPH:  no palpable lymphadenopathy in the cervical, axillary or inguinal regions LUNGS: clear to auscultation and  No wheeze or crackles HEART/CVS: regular rate & rhythm and Positive for murmurs; No lower extremity edema ABDOMEN:abdomen soft, non-tender and normal bowel sounds Musculoskeletal:no cyanosis of digits and  no clubbing  PSYCH: alert & oriented x 3 with fluent speech NEURO: no focal motor/sensory deficits SKIN:  no rashes or significant lesions  LABORATORY DATA:  I have reviewed the data as listed    Component Value Date/Time   NA 138 03/26/2018 1318   NA 143 01/10/2013 0314   K 4.6 03/26/2018 1318   K 4.0 01/10/2013 0314   CL 105 03/26/2018 1318   CL 109 (H) 01/10/2013 0314   CO2 24 03/26/2018 1318   CO2 27 01/10/2013 0314   GLUCOSE 145 (H) 03/26/2018 1318   GLUCOSE 119 (H) 01/10/2013 0314   BUN 24 (H) 03/26/2018 1318   BUN 15 01/10/2013 0314   CREATININE 1.22 (H) 03/26/2018 1318   CREATININE 1.03 01/10/2013 0314   CREATININE 1.13 (H) 09/18/2012 0848   CALCIUM 9.2 03/26/2018 1318   CALCIUM 8.1 (L) 01/10/2013 8466  PROT 6.9 03/05/2018 1044   PROT 7.2 12/20/2011 1521   ALBUMIN 3.9 03/05/2018 1044   ALBUMIN 3.9 12/20/2011 1521   AST 29 03/05/2018 1044   AST 31 12/20/2011 1521   ALT 28 03/05/2018 1044   ALT 37 12/20/2011 1521   ALKPHOS 60 03/05/2018 1044   ALKPHOS 57 12/20/2011 1521   BILITOT 0.5 03/05/2018 1044   BILITOT 0.5 12/20/2011 1521   GFRNONAA 40 (L) 03/26/2018 1318   GFRNONAA 52 (L) 01/10/2013 0314   GFRNONAA 47 (L) 09/18/2012 0848   GFRAA 46 (L) 03/26/2018 1318   GFRAA >60 01/10/2013 0314   GFRAA 54 (L) 09/18/2012 0848    No results found for: SPEP, UPEP  Lab Results  Component Value Date   WBC 8.1 03/26/2018   NEUTROABS 5.1 03/26/2018   HGB 10.9 (L) 03/26/2018   HCT 33.1 (L) 03/26/2018   MCV 86.9 03/26/2018   PLT 268 03/26/2018      Chemistry      Component Value Date/Time   NA 138 03/26/2018 1318   NA 143 01/10/2013 0314   K 4.6 03/26/2018 1318   K 4.0 01/10/2013 0314   CL 105 03/26/2018 1318   CL 109 (H) 01/10/2013 0314   CO2 24 03/26/2018 1318   CO2 27 01/10/2013 0314   BUN 24 (H) 03/26/2018 1318   BUN 15 01/10/2013 0314   CREATININE 1.22 (H) 03/26/2018 1318   CREATININE 1.03 01/10/2013 0314   CREATININE 1.13 (H) 09/18/2012 0848       Component Value Date/Time   CALCIUM 9.2 03/26/2018 1318   CALCIUM 8.1 (L) 01/10/2013 0314   ALKPHOS 60 03/05/2018 1044   ALKPHOS 57 12/20/2011 1521   AST 29 03/05/2018 1044   AST 31 12/20/2011 1521   ALT 28 03/05/2018 1044   ALT 37 12/20/2011 1521   BILITOT 0.5 03/05/2018 1044   BILITOT 0.5 12/20/2011 1521         ASSESSMENT & PLAN:   Iron deficiency anemia due to chronic blood loss # Recurrent iron deficiency anemia/question AVM-CKD: Today hemoglobin is 10.9/ferritin 8 saturation 7%.  Proceed with IV Feraheme.  #Long discussion regarding taking p.o. iron every other day; also recommend iron rich foods.  # patient will follow-up with labs/possible IV iron in 6 months; and again with me with labs possible IV iron 6 months.      Cammie Sickle, MD 03/28/2018 7:57 PM

## 2018-03-28 NOTE — Progress Notes (Signed)
Pt and VS stable at discharge. 

## 2018-03-28 NOTE — Assessment & Plan Note (Addendum)
#   Recurrent iron deficiency anemia/question AVM-CKD: Today hemoglobin is 10.9/ferritin 8 saturation 7%.  Proceed with IV Feraheme.  #Long discussion regarding taking p.o. iron every other day; also recommend iron rich foods.  # patient will follow-up with labs/possible IV iron in 6 months; and again with me with labs possible IV iron 6 months.

## 2018-04-03 ENCOUNTER — Telehealth: Payer: Self-pay | Admitting: Internal Medicine

## 2018-04-03 ENCOUNTER — Other Ambulatory Visit: Payer: Self-pay

## 2018-04-03 MED ORDER — TRAZODONE HCL 50 MG PO TABS: 25.0000 mg | ORAL_TABLET | Freq: Every evening | ORAL | 1 refills | Status: DC | PRN

## 2018-04-03 NOTE — Telephone Encounter (Signed)
rx sent in and left message for patient.

## 2018-04-03 NOTE — Telephone Encounter (Signed)
Copied from [place] [id number]. Topic: Quick Communication - Rx Refill/Question >> Apr 03, 2018 10:24 AM Robina Ade, Helene Kelp D wrote: Medication: traZODone (DESYREL) 50 MG tablet Has the patient contacted their pharmacy? Yes (Agent: If no, request that the patient contact the pharmacy for the refill.) Preferred Pharmacy (with phone number or street name): [id number] - [place], [address] Agent: Please be advised that RX refills may take up to 3 business days. We ask that you follow-up with your pharmacy.

## 2018-04-03 NOTE — Telephone Encounter (Signed)
Routing to correct office

## 2018-04-03 NOTE — Telephone Encounter (Signed)
Request for sleeping med, Trazodone 50 MG LR 10/04/17 for  #90 tabs and 1 refill  LOV  01/29/18 NOV  06/12/18   [id number]  Phillip Heal, [place]

## 2018-04-03 NOTE — Telephone Encounter (Signed)
Please advise 

## 2018-04-07 ENCOUNTER — Other Ambulatory Visit: Payer: Self-pay | Admitting: Internal Medicine

## 2018-06-06 ENCOUNTER — Telehealth: Payer: Self-pay | Admitting: Radiology

## 2018-06-06 DIAGNOSIS — E119 Type 2 diabetes mellitus without complications: Secondary | ICD-10-CM

## 2018-06-06 DIAGNOSIS — E7800 Pure hypercholesterolemia, unspecified: Secondary | ICD-10-CM

## 2018-06-06 DIAGNOSIS — I10 Essential (primary) hypertension: Secondary | ICD-10-CM

## 2018-06-06 NOTE — Telephone Encounter (Signed)
Pt coming in for labs Monday, please place future orders. Thank you 

## 2018-06-06 NOTE — Telephone Encounter (Signed)
Orders placed for f/u labs.  

## 2018-06-08 ENCOUNTER — Other Ambulatory Visit: Payer: Self-pay | Admitting: Internal Medicine

## 2018-06-09 ENCOUNTER — Other Ambulatory Visit (INDEPENDENT_AMBULATORY_CARE_PROVIDER_SITE_OTHER): Payer: Medicare Other

## 2018-06-09 DIAGNOSIS — E7800 Pure hypercholesterolemia, unspecified: Secondary | ICD-10-CM

## 2018-06-09 DIAGNOSIS — E119 Type 2 diabetes mellitus without complications: Secondary | ICD-10-CM

## 2018-06-09 LAB — HEPATIC FUNCTION PANEL
ALT: 29 U/L (ref 0–35)
AST: 35 U/L (ref 0–37)
Albumin: 3.9 g/dL (ref 3.5–5.2)
Alkaline Phosphatase: 57 U/L (ref 39–117)
BILIRUBIN DIRECT: 0.1 mg/dL (ref 0.0–0.3)
Total Bilirubin: 0.4 mg/dL (ref 0.2–1.2)
Total Protein: 7 g/dL (ref 6.0–8.3)

## 2018-06-09 LAB — LIPID PANEL
CHOL/HDL RATIO: 4
Cholesterol: 179 mg/dL (ref 0–200)
HDL: 48 mg/dL (ref >39.00)
NonHDL: 130.58
TRIGLYCERIDES: 214 mg/dL — AB (ref 0.0–149.0)
VLDL: 42.8 mg/dL — ABNORMAL HIGH (ref 0.0–40.0)

## 2018-06-09 LAB — HEMOGLOBIN A1C: Hgb A1c MFr Bld: 7.7 % — ABNORMAL HIGH (ref 4.6–6.5)

## 2018-06-09 LAB — BASIC METABOLIC PANEL
BUN: 23 mg/dL (ref 6–23)
CHLORIDE: 103 meq/L (ref 96–112)
CO2: 27 meq/L (ref 19–32)
Calcium: 9.2 mg/dL (ref 8.4–10.5)
Creatinine, Ser: 1.18 mg/dL (ref 0.40–1.20)
GFR: 46.38 mL/min — ABNORMAL LOW (ref >60.00)
Glucose, Bld: 137 mg/dL — ABNORMAL HIGH (ref 70–99)
Potassium: 4.3 mEq/L (ref 3.5–5.1)
Sodium: 139 mEq/L (ref 135–145)

## 2018-06-09 LAB — LDL CHOLESTEROL, DIRECT: Direct LDL: 104 mg/dL

## 2018-06-12 ENCOUNTER — Encounter: Payer: Self-pay | Admitting: Internal Medicine

## 2018-06-12 ENCOUNTER — Ambulatory Visit: Payer: Medicare Other | Admitting: Internal Medicine

## 2018-06-12 DIAGNOSIS — I779 Disorder of arteries and arterioles, unspecified: Secondary | ICD-10-CM | POA: Diagnosis not present

## 2018-06-12 DIAGNOSIS — R21 Rash and other nonspecific skin eruption: Secondary | ICD-10-CM

## 2018-06-12 DIAGNOSIS — I739 Peripheral vascular disease, unspecified: Secondary | ICD-10-CM

## 2018-06-12 DIAGNOSIS — I10 Essential (primary) hypertension: Secondary | ICD-10-CM

## 2018-06-12 DIAGNOSIS — E119 Type 2 diabetes mellitus without complications: Secondary | ICD-10-CM | POA: Diagnosis not present

## 2018-06-12 DIAGNOSIS — D649 Anemia, unspecified: Secondary | ICD-10-CM | POA: Diagnosis not present

## 2018-06-12 DIAGNOSIS — I2510 Atherosclerotic heart disease of native coronary artery without angina pectoris: Secondary | ICD-10-CM

## 2018-06-12 DIAGNOSIS — M25552 Pain in left hip: Secondary | ICD-10-CM

## 2018-06-12 DIAGNOSIS — E7800 Pure hypercholesterolemia, unspecified: Secondary | ICD-10-CM

## 2018-06-12 MED ORDER — ROSUVASTATIN CALCIUM 40 MG PO TABS: 40.0000 mg | ORAL_TABLET | Freq: Every day | ORAL | 3 refills | Status: DC

## 2018-06-12 NOTE — Progress Notes (Signed)
Patient ID: Jocelyn Elliott, female   DOB: July 15, 1932, 84 y.o.   MRN: [id number]   Subjective:    Patient ID: Jocelyn Elliott, female    DOB: Aug 28, 1932, 84 y.o.   MRN: [id number]  HPI  Patient here for a scheduled follow up.  Is followed by Dr Rogue Bussing for iron deficient anemia.  Last seen 03/28/18.  Receives IV iron infusions prn.  Recommended f/u in 6 months.  She reports she is doing well.  Feels good.  Stays active.  No chest pain.  Breathing stable.  No acid reflux.  No abdominal pain. Bowels moving.  No blood in her stool.  Still with some hip issues.  She felt therapy made it worse. Overall she feels is stable and desires no further intervention at this time.  Persistent rash - lower abdomen/groin.  Has been using nystatin cream and powder and trying to keep dry.  Still a persistent problem.  Discussed labs.  Discussed increasing cholesterol medication.    Past Medical History:  Diagnosis Date  . Arthritis   . AV malformation of gastrointestinal tract   . Blood in stool   . CAD (coronary artery disease)   . Carotid arterial disease ([place])   . Cataracts, bilateral   . Chronic blood loss anemia   . Chronic cystitis   . Depression   . Diabetes mellitus ([place])   . GERD (gastroesophageal reflux disease)   . Hyperlipidemia   . Hypertension   . IDA (iron deficiency anemia)   . Stress incontinence    Past Surgical History:  Procedure Laterality Date  . ABDOMINAL HYSTERECTOMY  1972   ovaries left in place  . APPENDECTOMY  1992  . BACK SURGERY  06/08/2010   L3, L4, L5  . CAROTID ARTERY ANGIOPLASTY  11/03  . [place], 92   right then left   . CHOLECYSTECTOMY  1993  . FOOT SURGERY  06/2007   Left  . right carotid artery surgery  01/09/13   Dr. Delana Meyer @ [facility]  . TOTAL HIP ARTHROPLASTY  05/03/2011   left 12, right 11/07   Family History  Problem Relation Age of Onset  . Hyperlipidemia Father   . Heart disease Father        myocardial infarction  .  Hypertension Father        Parent  . Arthritis Other        parent  . Diabetes Other        nephew  . Cervical cancer Sister   . Rectal cancer Sister   . Breast cancer Neg Hx    Social History   Socioeconomic History  . Marital status: Widowed    Spouse name: Not on file  . Number of children: 1  . Years of education: 84  . Highest education level: Not on file  Occupational History  . Occupation: Retired  Scientific laboratory technician  . Financial resource strain: Not on file  . Food insecurity:    Worry: Not on file    Inability: Not on file  . Transportation needs:    Medical: Not on file    Non-medical: Not on file  Tobacco Use  . Smoking status: Former Smoker    Types: Cigarettes    Last attempt to quit: 11/19/1989    Years since quitting: 28.5  . Smokeless tobacco: Never Used  Substance and Sexual Activity  . Alcohol use: No    Alcohol/week: 0.0 oz  . Drug use: No  .  Sexual activity: Not on file  Lifestyle  . Physical activity:    Days per week: Not on file    Minutes per session: Not on file  . Stress: Not on file  Relationships  . Social connections:    Talks on phone: Not on file    Gets together: Not on file    Attends religious service: Not on file    Active member of club or organization: Not on file    Attends meetings of clubs or organizations: Not on file    Relationship status: Not on file  Other Topics Concern  . Not on file  Social History Narrative   Regular exercise-no   Caffeine Use-yes    Outpatient Encounter Medications as of 06/12/2018  Medication Sig  . aspirin 81 MG tablet Take 81 mg by mouth daily.  . Biotin 5000 MCG CAPS Take 1 capsule by mouth daily.  . cetirizine (ZYRTEC) 10 MG tablet Take 10 mg by mouth daily.  . metFORMIN (GLUCOPHAGE) 500 MG tablet TAKE 1 TABLET DAILY WITH BREAKFAST  . metoprolol succinate (TOPROL-XL) 100 MG 24 hr tablet TAKE 1 TABLET TWICE A DAY  . nystatin cream (MYCOSTATIN) Apply 1 application topically 2 (two) times  daily.  . Omega-3 Fatty Acids (FISH OIL) 1200 MG CAPS Take 1 capsule by mouth daily.  Marland Kitchen omeprazole (PRILOSEC) 20 MG capsule Take 20 mg by mouth daily.  . ramipril (ALTACE) 10 MG capsule Take 1 capsule (10 mg total) by mouth daily.  . rosuvastatin (CRESTOR) 40 MG tablet Take 1 tablet (40 mg total) by mouth daily.  . traZODone (DESYREL) 50 MG tablet Take 0.5-1 tablets (25-50 mg total) by mouth at bedtime as needed for sleep.  . [DISCONTINUED] metFORMIN (GLUCOPHAGE-XR) 500 MG 24 hr tablet TAKE 1 TABLET TWICE A DAY (Patient taking differently: TAKE 1 TABLET DAILY)  . [DISCONTINUED] rosuvastatin (CRESTOR) 20 MG tablet TAKE 1 TABLET DAILY   No facility-administered encounter medications on file as of 06/12/2018.     Review of Systems  Constitutional: Negative for appetite change and unexpected weight change.  HENT: Negative for congestion and sinus pressure.   Respiratory: Negative for cough, chest tightness and shortness of breath.   Cardiovascular: Negative for chest pain, palpitations and leg swelling.  Gastrointestinal: Negative for abdominal pain, diarrhea, nausea and vomiting.  Genitourinary: Negative for difficulty urinating and dysuria.  Musculoskeletal: Negative for joint swelling and myalgias.       Hip discomfort as outlined.    Skin: Negative for color change and rash.  Neurological: Negative for dizziness, light-headedness and headaches.  Psychiatric/Behavioral: Negative for agitation and dysphoric mood.       Objective:    Physical Exam  Constitutional: She appears well-developed and well-nourished. No distress.  HENT:  Nose: Nose normal.  Mouth/Throat: Oropharynx is clear and moist.  Neck: Neck supple. No thyromegaly present.  Cardiovascular: Normal rate and regular rhythm.  Pulmonary/Chest: Breath sounds normal. No respiratory distress. She has no wheezes.  Abdominal: Soft. Bowel sounds are normal. There is no tenderness.  Musculoskeletal: She exhibits no edema or  tenderness.  Lymphadenopathy:    She has no cervical adenopathy.  Skin: No rash noted. No erythema.  Psychiatric: She has a normal mood and affect. Her behavior is normal.    BP 138/80 (BP Location: Left Arm, Patient Position: Sitting, Cuff Size: Normal)   Pulse 70   Temp 97.8 F (36.6 C) (Oral)   Resp 16   Wt 142 lb 9.6 oz (64.7 kg)  SpO2 96%   BMI 29.80 kg/m  Wt Readings from Last 3 Encounters:  06/12/18 142 lb 9.6 oz (64.7 kg)  03/28/18 141 lb 9.6 oz (64.2 kg)  01/29/18 143 lb 9.6 oz (65.1 kg)     Lab Results  Component Value Date   WBC 8.1 03/26/2018   HGB 10.9 (L) 03/26/2018   HCT 33.1 (L) 03/26/2018   PLT 268 03/26/2018   GLUCOSE 137 (H) 06/09/2018   CHOL 179 06/09/2018   TRIG 214.0 (H) 06/09/2018   HDL 48.00 06/09/2018   LDLDIRECT 104.0 06/09/2018   LDLCALC 72 05/28/2017   ALT 29 06/09/2018   AST 35 06/09/2018   NA 139 06/09/2018   K 4.3 06/09/2018   CL 103 06/09/2018   CREATININE 1.18 06/09/2018   BUN 23 06/09/2018   CO2 27 06/09/2018   TSH 2.25 09/23/2017   INR 1.0 01/10/2013   HGBA1C 7.7 (H) 06/09/2018   MICROALBUR 9.2 (H) 09/23/2017    Mm Screening Breast Tomo Bilateral  Result Date: 02/14/2018 CLINICAL DATA:  Screening. EXAM: DIGITAL SCREENING BILATERAL MAMMOGRAM WITH TOMO AND CAD COMPARISON:  Previous exam(s). ACR Breast Density Category c: The breast tissue is heterogeneously dense, which may obscure small masses. FINDINGS: There are no findings suspicious for malignancy. Images were processed with CAD. IMPRESSION: No mammographic evidence of malignancy. A result letter of this screening mammogram will be mailed directly to the patient. RECOMMENDATION: Screening mammogram in one year. (Code:SM-B-01Y) BI-RADS CATEGORY  1: Negative. Electronically Signed   By: Lovey Newcomer M.D.   On: 02/14/2018 12:37       Assessment & Plan:   Problem List Items Addressed This Visit    Anemia    Followed by hematology.  Receiving iron infusions prn.  Follow cbc.         CAD (coronary artery disease)    Followed by cardiology.  Stable.        Relevant Medications   rosuvastatin (CRESTOR) 40 MG tablet   Carotid arterial disease ([place])    Followed by vascular surgery.        Relevant Medications   rosuvastatin (CRESTOR) 40 MG tablet   Diabetes mellitus ([facility])    Low carb diet and exercise.  Follow met b and a1c. a1c just checked improved - 7.7.        Relevant Medications   rosuvastatin (CRESTOR) 40 MG tablet   Other Relevant Orders   Hemoglobin X7L   Basic metabolic panel   Microalbumin / creatinine urine ratio   Hip pain    S/p physical therapy.  Desires no further intervention.  Follow.        Hypercholesteremia    On crestor.  Discussed labs.  Increase crestor to '40mg'$  q day.  Follow lipid panel and liver function tests.        Relevant Medications   rosuvastatin (CRESTOR) 40 MG tablet   Other Relevant Orders   Hepatic function panel   Lipid panel   Hypertension    Blood pressure under good control.  Continue same medication regimen.  Follow pressures.  Follow metabolic panel.        Relevant Medications   rosuvastatin (CRESTOR) 40 MG tablet   Other Relevant Orders   TSH   Rash    Persistent rash as outlined. Has tried keeping the area dry and has used nystatin cream and powder.  Persistent.  Refer to dermatology.        Relevant Orders   Ambulatory referral to Dermatology  Einar Pheasant, MD

## 2018-06-14 ENCOUNTER — Encounter: Payer: Self-pay | Admitting: Internal Medicine

## 2018-06-14 DIAGNOSIS — R21 Rash and other nonspecific skin eruption: Secondary | ICD-10-CM | POA: Insufficient documentation

## 2018-06-14 NOTE — Assessment & Plan Note (Signed)
Low carb diet and exercise.  Follow met b and a1c. a1c just checked improved - 7.7.

## 2018-06-14 NOTE — Assessment & Plan Note (Signed)
On crestor.  Discussed labs.  Increase crestor to 40mg  q day.  Follow lipid panel and liver function tests.

## 2018-06-14 NOTE — Assessment & Plan Note (Signed)
Followed by hematology.  Receiving iron infusions prn.  Follow cbc.

## 2018-06-14 NOTE — Assessment & Plan Note (Signed)
Followed by vascular surgery. 

## 2018-06-14 NOTE — Assessment & Plan Note (Signed)
Followed by cardiology. Stable.   

## 2018-06-14 NOTE — Assessment & Plan Note (Signed)
Persistent rash as outlined. Has tried keeping the area dry and has used nystatin cream and powder.  Persistent.  Refer to dermatology.

## 2018-06-14 NOTE — Assessment & Plan Note (Signed)
Blood pressure under good control.  Continue same medication regimen.  Follow pressures.  Follow metabolic panel.   

## 2018-06-14 NOTE — Assessment & Plan Note (Signed)
S/p physical therapy.  Desires no further intervention.  Follow.

## 2018-07-08 ENCOUNTER — Ambulatory Visit: Payer: Self-pay

## 2018-07-08 NOTE — Telephone Encounter (Signed)
FYI patient is seeing you on thursday

## 2018-07-08 NOTE — Telephone Encounter (Signed)
fyi

## 2018-07-08 NOTE — Telephone Encounter (Signed)
Pt. Reports started having diarrhea last Monday - 06/30/18. Has watery stools after eating. 2-3 per day. Some cramping with this - upper abdomen. Has lost 6 pounds.Denies any vomiting. No one else in the family sick. No signs of dehydration other than feels "a little weak." States she has a history of "losing blood through my small intestine." Denies any blood in stool. Appointment made for Thursday, due to transportation. No availability with Dr. Nicki Reaper. Will go to ED if symptoms worsen.  Reason for Disposition . [1] MILD diarrhea (e.g., 1-3 or more stools than normal in past 24 hours) without known cause AND [2] present >  7 days  Answer Assessment - Initial Assessment Questions 1. DIARRHEA SEVERITY: "How bad is the diarrhea?" "How many extra stools have you had in the past 24 hours than normal?"    - NO DIARRHEA (SCALE 0)   - MILD (SCALE 1-3): Few loose or mushy BMs; increase of 1-3 stools over normal daily number of stools; mild increase in ostomy output.   -  MODERATE (SCALE 4-7): Increase of 4-6 stools daily over normal; moderate increase in ostomy output. * SEVERE (SCALE 8-10; OR 'WORST POSSIBLE'): Increase of 7 or more stools daily over normal; moderate increase in ostomy output; incontinence.     After eating. 2-3 times a day 2. ONSET: "When did the diarrhea begin?"      Last Monday 8/12 3. BM CONSISTENCY: "How loose or watery is the diarrhea?"      Watery 4. VOMITING: "Are you also vomiting?" If so, ask: "How many times in the past 24 hours?"      No 5. ABDOMINAL PAIN: "Are you having any abdominal pain?" If yes: "What does it feel like?" (e.g., crampy, dull, intermittent, constant)      Upper stomach 6. ABDOMINAL PAIN SEVERITY: If present, ask: "How bad is the pain?"  (e.g., Scale 1-10; mild, moderate, or severe)   - MILD (1-3): doesn't interfere with normal activities, abdomen soft and not tender to touch    - MODERATE (4-7): interferes with normal activities or awakens from sleep,  tender to touch    - SEVERE (8-10): excruciating pain, doubled over, unable to do any normal activities       No 7. ORAL INTAKE: If vomiting, "Have you been able to drink liquids?" "How much fluids have you had in the past 24 hours?"     Yes 8. HYDRATION: "Any signs of dehydration?" (e.g., dry mouth [not just dry lips], too weak to stand, dizziness, new weight loss) "When did you last urinate?"     Has lost about 6 pounds 9. EXPOSURE: "Have you traveled to a foreign country recently?" "Have you been exposed to anyone with diarrhea?" "Could you have eaten any food that was spoiled?"     No 10. ANTIBIOTIC USE: "Are you taking antibiotics now or have you taken antibiotics in the past 2 months?"       No 11. OTHER SYMPTOMS: "Do you have any other symptoms?" (e.g., fever, blood in stool)       No 12. PREGNANCY: "Is there any chance you are pregnant?" "When was your last menstrual period?"       n/a  Protocols used: DIARRHEA-A-AH

## 2018-07-10 ENCOUNTER — Ambulatory Visit: Payer: Medicare Other | Admitting: Family Medicine

## 2018-07-10 ENCOUNTER — Encounter: Payer: Self-pay | Admitting: Family Medicine

## 2018-07-10 VITALS — BP 122/60 | HR 72 | Temp 97.9°F | Resp 16 | Wt 139.1 lb

## 2018-07-10 DIAGNOSIS — R1010 Upper abdominal pain, unspecified: Secondary | ICD-10-CM

## 2018-07-10 DIAGNOSIS — R110 Nausea: Secondary | ICD-10-CM

## 2018-07-10 DIAGNOSIS — K219 Gastro-esophageal reflux disease without esophagitis: Secondary | ICD-10-CM | POA: Diagnosis not present

## 2018-07-10 DIAGNOSIS — R197 Diarrhea, unspecified: Secondary | ICD-10-CM | POA: Diagnosis not present

## 2018-07-10 MED ORDER — RANITIDINE HCL 150 MG PO CAPS: 150.0000 mg | ORAL_CAPSULE | Freq: Every evening | ORAL | 1 refills | Status: DC

## 2018-07-10 MED ORDER — ONDANSETRON HCL 4 MG PO TABS: 4.0000 mg | ORAL_TABLET | Freq: Three times a day (TID) | ORAL | 0 refills | Status: DC | PRN

## 2018-07-10 NOTE — Progress Notes (Signed)
Subjective:    Patient ID: Jocelyn Elliott, female    DOB: 1932-11-03, 84 y.o.   MRN: [id number]  HPI  Patient presents to clinic due to diarrhea for the past 2 weeks.  States her stomach initially felt off after eating a cheeseburger from [facility].  States now everything she eats seems to run through her like water.  Also has some nausea and upper abdominal fullness.  She has a history of acid reflux for which she takes omeprazole 20 mg daily and also reports a history of GI bleed.  Patient has been drinking water, and one [employer]eps[employer] daily.  She has been eating chicken soup with rice, baked potatoes and had chicken nuggets for dinner last night.  She denies traveling anywhere new or eating any new foods.  Denies any known sick contacts.  No fever or chills.  Patient Active Problem List   Diagnosis Date Noted  . Rash 06/14/2018  . Dizziness 05/22/2017  . Iron deficiency anemia due to chronic blood loss 09/20/2016  . Hypotension 04/06/2016  . Respiratory infection 04/06/2016  . Health care maintenance 10/28/2015  . SOB (shortness of breath) on exertion 09/20/2015  . Knee pain, bilateral 09/20/2015  . Sleeping difficulty 07/24/2015  . Hepatomegaly 07/24/2015  . Generalized anxiety disorder 04/23/2015  . UTI (urinary tract infection) 05/22/2014  . Hip pain 10/11/2013  . Tendonitis 07/19/2013  . Leg numbness 04/07/2013  . CAD (coronary artery disease) 09/05/2012  . Hypertension 09/05/2012  . Hypercholesteremia 09/05/2012  . Diabetes mellitus ([place]) 09/05/2012  . Carotid arterial disease ([place]) 09/05/2012  . Anemia 09/05/2012   Social History   Tobacco Use  . Smoking status: Former Smoker    Types: Cigarettes    Last attempt to quit: 11/19/1989    Years since quitting: 28.6  . Smokeless tobacco: Never Used  Substance Use Topics  . Alcohol use: No    Alcohol/week: 0.0 standard drinks   Review of Systems  Constitutional: Negative for chills, fatigue and fever.  HENT: Negative  for congestion, ear pain, sinus pain and sore throat.   Eyes: Negative.   Respiratory: Negative for cough, shortness of breath and wheezing.   Cardiovascular: Negative for chest pain, palpitations and leg swelling.  Gastrointestinal: Postive for abdominal pain, diarrhea, nausea. No vomiting.  Genitourinary: Negative for dysuria, frequency and urgency.  Musculoskeletal: Negative for arthralgias and myalgias.  Skin: Negative for color change, pallor and rash.  Neurological: Negative for syncope, light-headedness and headaches.  Psychiatric/Behavioral: The patient is not nervous/anxious.    Objective:   Physical Exam  Constitutional: She appears well-developed and well-nourished. No distress.  HENT:  Head: Normocephalic and atraumatic.  Eyes: EOM are normal. No scleral icterus.  Neck: Neck supple. No tracheal deviation present.  Cardiovascular: Normal rate and regular rhythm.  Pulmonary/Chest: Effort normal and breath sounds normal. No respiratory distress.  Abdominal: Soft. Bowel sounds are normal. She exhibits no distension. There is tenderness. There is no rebound and no guarding.    Red circle on diagram indicates area of tenderness. No LLQ or RLQ pain  Skin: Skin is warm and dry. She is not diaphoretic. No pallor.  Psychiatric: She has a normal mood and affect. Her behavior is normal.  Nursing note and vitals reviewed.   Vitals:   07/10/18 1522  BP: 122/60  Pulse: 72  Resp: 16  Temp: 97.9 F (36.6 C)  SpO2: 96%      Assessment & Plan:    Upper abdominal pain/acid reflux - I  wonder if nausea or abdominal pain is related to increased stomach acid.  She will continue omeprazole 20 mg daily and we will add zantac 150 mg as well.  CBC and basic metabolic panel ordered to look for anemia, check electrolytes.  Advised to keep self well-hydrated with things like water, Gatorade or Pedialyte.  Advised to eat bland foods such as plain soups, plain toast, white rice and slowly  advance diet as tolerated  Nausea-  patient will use Zofran as needed to control nausea.    Diarrhea - patient has used a few doses of Imodium at home to control diarrhea.  Patient advised she may use Imodium as needed for loose stools.  Stool sample applies to collect stool sample and bring to lab for testing.   Keep regularly scheduled appt on 10/14/2018

## 2018-07-10 NOTE — Patient Instructions (Signed)
Great to meet you! 

## 2018-07-11 ENCOUNTER — Other Ambulatory Visit
Admission: RE | Admit: 2018-07-11 | Discharge: 2018-07-11 | Disposition: A | Discharge status: Home / Self Care | Payer: Medicare Other | Source: Ambulatory Visit | Attending: Family Medicine | Admitting: Family Medicine

## 2018-07-11 ENCOUNTER — Encounter: Payer: Self-pay | Admitting: Lab

## 2018-07-11 ENCOUNTER — Telehealth: Payer: Self-pay

## 2018-07-11 DIAGNOSIS — R197 Diarrhea, unspecified: Secondary | ICD-10-CM | POA: Diagnosis present

## 2018-07-11 LAB — GASTROINTESTINAL PANEL BY PCR, STOOL (REPLACES STOOL CULTURE)

## 2018-07-11 LAB — C DIFFICILE QUICK SCREEN W PCR REFLEX
C DIFFICILE (CDIFF) TOXIN: NEGATIVE
C Diff antigen: NEGATIVE
C Diff interpretation: NOT DETECTED

## 2018-07-11 LAB — CBC
HCT: 35.2 % — ABNORMAL LOW (ref 36.0–46.0)
Hemoglobin: 11.6 g/dL — ABNORMAL LOW (ref 12.0–15.0)
MCHC: 33 g/dL (ref 30.0–36.0)
MCV: 86.3 fl (ref 78.0–100.0)
PLATELETS: 186 10*3/uL (ref 150.0–400.0)
RBC: 4.08 Mil/uL (ref 3.87–5.11)
RDW: 16.5 % — ABNORMAL HIGH (ref 11.5–15.5)
WBC: 8.8 10*3/uL (ref 4.0–10.5)

## 2018-07-11 LAB — BASIC METABOLIC PANEL
BUN: 13 mg/dL (ref 6–23)
CHLORIDE: 104 meq/L (ref 96–112)
CO2: 26 mEq/L (ref 19–32)
Calcium: 9.3 mg/dL (ref 8.4–10.5)
Creatinine, Ser: 1.54 mg/dL — ABNORMAL HIGH (ref 0.40–1.20)
GFR: 34.1 mL/min — ABNORMAL LOW (ref >60.00)
Glucose, Bld: 110 mg/dL — ABNORMAL HIGH (ref 70–99)
POTASSIUM: 3.7 meq/L (ref 3.5–5.1)
SODIUM: 138 meq/L (ref 135–145)

## 2018-07-11 NOTE — Telephone Encounter (Signed)
Copied from [place] [id number]. Topic: Quick Communication - See Telephone Encounter >> Jul 11, 2018  2:06 PM Gardiner Ramus wrote: CRM for notification. See Telephone encounter for: 07/11/18. Pt called and stated that she received a call from Ali Chukson. She would like jill to call back. Please advise >> Jul 11, 2018  3:20 PM Sheran Luz wrote: Pt called again asking for a call back from [place].

## 2018-07-15 ENCOUNTER — Encounter: Payer: Self-pay | Admitting: Lab

## 2018-07-25 ENCOUNTER — Other Ambulatory Visit: Payer: Self-pay

## 2018-07-25 DIAGNOSIS — R110 Nausea: Secondary | ICD-10-CM

## 2018-07-25 MED ORDER — RANITIDINE HCL 150 MG PO CAPS: 150.0000 mg | ORAL_CAPSULE | Freq: Every evening | ORAL | 1 refills | Status: DC

## 2018-07-28 ENCOUNTER — Other Ambulatory Visit (INDEPENDENT_AMBULATORY_CARE_PROVIDER_SITE_OTHER): Payer: Self-pay | Admitting: Vascular Surgery

## 2018-07-28 ENCOUNTER — Ambulatory Visit (INDEPENDENT_AMBULATORY_CARE_PROVIDER_SITE_OTHER): Payer: Medicare Other | Admitting: Vascular Surgery

## 2018-07-28 ENCOUNTER — Ambulatory Visit (INDEPENDENT_AMBULATORY_CARE_PROVIDER_SITE_OTHER): Payer: Medicare Other

## 2018-07-28 ENCOUNTER — Encounter (INDEPENDENT_AMBULATORY_CARE_PROVIDER_SITE_OTHER): Payer: Self-pay | Admitting: Vascular Surgery

## 2018-07-28 VITALS — BP 152/69 | HR 69 | Resp 12 | Ht <= 58 in | Wt 138.0 lb

## 2018-07-28 DIAGNOSIS — I6523 Occlusion and stenosis of bilateral carotid arteries: Secondary | ICD-10-CM

## 2018-07-28 DIAGNOSIS — E119 Type 2 diabetes mellitus without complications: Secondary | ICD-10-CM | POA: Diagnosis not present

## 2018-07-28 DIAGNOSIS — I2510 Atherosclerotic heart disease of native coronary artery without angina pectoris: Secondary | ICD-10-CM

## 2018-07-28 DIAGNOSIS — I10 Essential (primary) hypertension: Secondary | ICD-10-CM

## 2018-07-28 DIAGNOSIS — I779 Disorder of arteries and arterioles, unspecified: Secondary | ICD-10-CM | POA: Diagnosis not present

## 2018-07-28 DIAGNOSIS — I739 Peripheral vascular disease, unspecified: Principal | ICD-10-CM

## 2018-07-28 NOTE — Progress Notes (Signed)
MRN : [id number]  Jocelyn Elliott is a 84 y.o. (23-Mar-1932) female who presents with chief complaint of No chief complaint on file. Marland Kitchen  History of Present Illness:   The patient is seen for follow up evaluation of carotid stenosis. The carotid stenosis followed by ultrasound.   The patient denies amaurosis fugax. There is no recent history of TIA symptoms or focal motor deficits. There is no prior documented CVA.  The patient is taking enteric-coated aspirin 81 mg daily.  There is no history of migraine headaches. There is no history of seizures.  The patient has a history of coronary artery disease, no recent episodes of angina or shortness of breath. The patient denies PAD or claudication symptoms. There is a history of hyperlipidemia which is being treated with a statin.    Carotid Duplex done today shows patent right CEA and 69% LICA stenosis.  No change compared to last study in 07/25/2017  No outpatient medications have been marked as taking for the 07/28/18 encounter (Appointment) with Delana Meyer, Dolores Lory, MD.    Past Medical History:  Diagnosis Date  . Arthritis   . AV malformation of gastrointestinal tract   . Blood in stool   . CAD (coronary artery disease)   . Carotid arterial disease ([place])   . Cataracts, bilateral   . Chronic blood loss anemia   . Chronic cystitis   . Depression   . Diabetes mellitus ([place])   . GERD (gastroesophageal reflux disease)   . Hyperlipidemia   . Hypertension   . IDA (iron deficiency anemia)   . Stress incontinence     Past Surgical History:  Procedure Laterality Date  . ABDOMINAL HYSTERECTOMY  1972   ovaries left in place  . APPENDECTOMY  1992  . BACK SURGERY  06/08/2010   L3, L4, L5  . CAROTID ARTERY ANGIOPLASTY  11/03  . [place], 92   right then left   . CHOLECYSTECTOMY  1993  . FOOT SURGERY  06/2007   Left  . right carotid artery surgery  01/09/13   Dr. Delana Meyer @ [facility]  . TOTAL HIP ARTHROPLASTY   05/03/2011   left 12, right 11/07    Social History Social History   Tobacco Use  . Smoking status: Former Smoker    Types: Cigarettes    Last attempt to quit: 11/19/1989    Years since quitting: 28.7  . Smokeless tobacco: Never Used  Substance Use Topics  . Alcohol use: No    Alcohol/week: 0.0 standard drinks  . Drug use: No    Family History Family History  Problem Relation Age of Onset  . Hyperlipidemia Father   . Heart disease Father        myocardial infarction  . Hypertension Father        Parent  . Arthritis Other        parent  . Diabetes Other        nephew  . Cervical cancer Sister   . Rectal cancer Sister   . Breast cancer Neg Hx     Allergies  Allergen Reactions  . Ciprofloxacin   . Levaquin [Levofloxacin]   . Tequin [Gatifloxacin] Hives and Rash     REVIEW OF SYSTEMS (Negative unless checked)  Constitutional: [] Weight loss  [] Fever  [] Chills Cardiac: [] Chest pain   [] Chest pressure   [] Palpitations   [] Shortness of breath when laying flat   [] Shortness of breath with exertion. Vascular:  [x] Pain in legs  with walking   [] Pain in legs at rest  [] History of DVT   [] Phlebitis   [] Swelling in legs   [] Varicose veins   [] Non-healing ulcers Pulmonary:   [] Uses home oxygen   [] Productive cough   [] Hemoptysis   [] Wheeze  [] COPD   [] Asthma Neurologic:  [x] Dizziness   [] Seizures   [] History of stroke   [x] History of TIA  [] Aphasia   [] Vissual changes   [] Weakness or numbness in arm   [] Weakness or numbness in leg Musculoskeletal:   [] Joint swelling   [] Joint pain   [] Low back pain Hematologic:  [] Easy bruising  [] Easy bleeding   [] Hypercoagulable state   [] Anemic Gastrointestinal:  [] Diarrhea   [] Vomiting  [] Gastroesophageal reflux/heartburn   [] Difficulty swallowing. Genitourinary:  [] Chronic kidney disease   [] Difficult urination  [] Frequent urination   [] Blood in urine Skin:  [] Rashes   [] Ulcers  Psychological:  [] History of anxiety   []  History of major  depression.  Physical Examination  There were no vitals filed for this visit. There is no height or weight on file to calculate BMI. Gen: WD/WN, NAD Head: [place]/AT, No temporalis wasting.  Ear/Nose/Throat: Hearing grossly intact, nares w/o erythema or drainage Eyes: PER, EOMI, sclera nonicteric.  Neck: Supple, no large masses.   Pulmonary:  Good air movement, no audible wheezing bilaterally, no use of accessory muscles.  Cardiac: RRR, no JVD Vascular: Well-healed right carotid endarterectomy incisional scar; positive left carotid bruit Vessel Right Left  Radial Palpable Palpable  Brachial Palpable Palpable  Carotid Palpable Palpable  PT Not Palpable Not Palpable  DP Not Palpable Not Palpable  Gastrointestinal: Non-distended. No guarding/no peritoneal signs.  Musculoskeletal: M/S 5/5 throughout.  No deformity or atrophy.  Neurologic: CN 2-12 intact. Symmetrical.  Speech is fluent. Motor exam as listed above. Psychiatric: Judgment intact, Mood & affect appropriate for pt's clinical situation. Dermatologic: No rashes or ulcers noted.  No changes consistent with cellulitis. Lymph : No lichenification or skin changes of chronic lymphedema.  CBC Lab Results  Component Value Date   WBC 8.8 07/10/2018   HGB 11.6 (L) 07/10/2018   HCT 35.2 (L) 07/10/2018   MCV 86.3 07/10/2018   PLT 186.0 07/10/2018    BMET    Component Value Date/Time   NA 138 07/10/2018 1555   NA 143 01/10/2013 0314   K 3.7 07/10/2018 1555   K 4.0 01/10/2013 0314   CL 104 07/10/2018 1555   CL 109 (H) 01/10/2013 0314   CO2 26 07/10/2018 1555   CO2 27 01/10/2013 0314   GLUCOSE 110 (H) 07/10/2018 1555   GLUCOSE 119 (H) 01/10/2013 0314   BUN 13 07/10/2018 1555   BUN 15 01/10/2013 0314   CREATININE 1.54 (H) 07/10/2018 1555   CREATININE 1.03 01/10/2013 0314   CREATININE 1.13 (H) 09/18/2012 0848   CALCIUM 9.3 07/10/2018 1555   CALCIUM 8.1 (L) 01/10/2013 0314   GFRNONAA 40 (L) 03/26/2018 1318   GFRNONAA 52 (L)  01/10/2013 0314   GFRNONAA 47 (L) 09/18/2012 0848   GFRAA 46 (L) 03/26/2018 1318   GFRAA >60 01/10/2013 0314   GFRAA 54 (L) 09/18/2012 0848   CrCl cannot be calculated (Unknown ideal weight.).  COAG Lab Results  Component Value Date   INR 1.0 01/10/2013    Radiology No results found.   Assessment/Plan 1. Carotid artery disease, unspecified laterality, unspecified type ([place]) Recommend:  Given the patient's asymptomatic subcritical stenosis no further invasive testing or surgery at this time.  Duplex ultrasound shows <50% stenosis bilaterally.  Continue antiplatelet therapy as prescribed Continue management of CAD, HTN and Hyperlipidemia Healthy heart diet,  encouraged exercise at least 4 times per week  Follow up in 12 months with duplex ultrasound and physical exam   2. Coronary artery disease involving native coronary artery of native heart without angina pectoris Continue cardiac and antihypertensive medications as already ordered and reviewed, no changes at this time.  Continue statin as ordered and reviewed, no changes at this time  Nitrates PRN for chest pain   3. Essential hypertension Continue antihypertensive medications as already ordered, these medications have been reviewed and there are no changes at this time.   4. Type 2 diabetes mellitus without complication, without long-term current use of insulin (HCC) Continue hypoglycemic medications as already ordered, these medications have been reviewed and there are no changes at this time.  Hgb A1C to be monitored as already arranged by primary service     Hortencia Pilar, MD  07/28/2018 12:59 PM

## 2018-08-08 ENCOUNTER — Encounter (INDEPENDENT_AMBULATORY_CARE_PROVIDER_SITE_OTHER): Payer: Self-pay | Admitting: Vascular Surgery

## 2018-08-10 ENCOUNTER — Other Ambulatory Visit: Payer: Self-pay | Admitting: Internal Medicine

## 2018-09-22 ENCOUNTER — Other Ambulatory Visit: Payer: Self-pay | Admitting: Internal Medicine

## 2018-09-22 DIAGNOSIS — D500 Iron deficiency anemia secondary to blood loss (chronic): Secondary | ICD-10-CM

## 2018-09-26 ENCOUNTER — Inpatient Hospital Stay: Payer: Medicare Other | Attending: Internal Medicine

## 2018-09-26 DIAGNOSIS — D500 Iron deficiency anemia secondary to blood loss (chronic): Secondary | ICD-10-CM

## 2018-09-26 DIAGNOSIS — D509 Iron deficiency anemia, unspecified: Secondary | ICD-10-CM | POA: Diagnosis present

## 2018-09-26 DIAGNOSIS — N179 Acute kidney failure, unspecified: Secondary | ICD-10-CM | POA: Diagnosis not present

## 2018-09-26 LAB — CBC WITH DIFFERENTIAL/PLATELET
ABS IMMATURE GRANULOCYTES: 0.02 10*3/uL (ref 0.00–0.07)
BASOS ABS: 0.1 10*3/uL (ref 0.0–0.1)
BASOS PCT: 1 %
Eosinophils Absolute: 0.2 10*3/uL (ref 0.0–0.5)
Eosinophils Relative: 3 %
HCT: 25.8 % — ABNORMAL LOW (ref 36.0–46.0)
HEMOGLOBIN: 7.8 g/dL — AB (ref 12.0–15.0)
Immature Granulocytes: 0 %
LYMPHS PCT: 26 %
Lymphs Abs: 1.9 10*3/uL (ref 0.7–4.0)
MCH: 26.2 pg (ref 26.0–34.0)
MCHC: 30.2 g/dL (ref 30.0–36.0)
MCV: 86.6 fL (ref 80.0–100.0)
MONO ABS: 0.5 10*3/uL (ref 0.1–1.0)
Monocytes Relative: 7 %
NEUTROS ABS: 4.6 10*3/uL (ref 1.7–7.7)
NRBC: 0 % (ref 0.0–0.2)
Neutrophils Relative %: 63 %
PLATELETS: 235 10*3/uL (ref 150–400)
RBC: 2.98 MIL/uL — AB (ref 3.87–5.11)
RDW: 15.9 % — ABNORMAL HIGH (ref 11.5–15.5)
WBC: 7.3 10*3/uL (ref 4.0–10.5)

## 2018-09-26 LAB — COMPREHENSIVE METABOLIC PANEL
ALT: 26 U/L (ref 0–44)
AST: 39 U/L (ref 15–41)
Albumin: 3.5 g/dL (ref 3.5–5.0)
Alkaline Phosphatase: 53 U/L (ref 38–126)
Anion gap: 8 (ref 5–15)
BILIRUBIN TOTAL: 0.4 mg/dL (ref 0.3–1.2)
BUN: 32 mg/dL — ABNORMAL HIGH (ref 8–23)
CHLORIDE: 106 mmol/L (ref 98–111)
CO2: 22 mmol/L (ref 22–32)
Calcium: 9.2 mg/dL (ref 8.9–10.3)
Creatinine, Ser: 1.89 mg/dL — ABNORMAL HIGH (ref 0.44–1.00)
GFR, EST AFRICAN AMERICAN: 27 mL/min — AB (ref >60)
GFR, EST NON AFRICAN AMERICAN: 23 mL/min — AB (ref >60)
Glucose, Bld: 152 mg/dL — ABNORMAL HIGH (ref 70–99)
POTASSIUM: 4.5 mmol/L (ref 3.5–5.1)
Sodium: 136 mmol/L (ref 135–145)
TOTAL PROTEIN: 6.9 g/dL (ref 6.5–8.1)

## 2018-09-26 LAB — IRON AND TIBC
IRON: 30 ug/dL (ref 28–170)
Saturation Ratios: 7 % — ABNORMAL LOW (ref 10.4–31.8)
TIBC: 444 ug/dL (ref 250–450)
UIBC: 414 ug/dL

## 2018-09-26 LAB — FERRITIN: FERRITIN: 8 ng/mL — AB (ref 11–307)

## 2018-09-29 ENCOUNTER — Encounter: Payer: Self-pay | Admitting: Internal Medicine

## 2018-09-29 ENCOUNTER — Inpatient Hospital Stay: Payer: Medicare Other

## 2018-09-29 ENCOUNTER — Other Ambulatory Visit: Payer: Self-pay

## 2018-09-29 ENCOUNTER — Inpatient Hospital Stay (HOSPITAL_BASED_OUTPATIENT_CLINIC_OR_DEPARTMENT_OTHER): Payer: Medicare Other | Admitting: Internal Medicine

## 2018-09-29 ENCOUNTER — Telehealth: Payer: Self-pay | Admitting: Internal Medicine

## 2018-09-29 VITALS — BP 133/69 | HR 73 | Temp 98.2°F | Resp 18 | Ht <= 58 in | Wt 140.0 lb

## 2018-09-29 DIAGNOSIS — D500 Iron deficiency anemia secondary to blood loss (chronic): Secondary | ICD-10-CM

## 2018-09-29 DIAGNOSIS — N179 Acute kidney failure, unspecified: Secondary | ICD-10-CM

## 2018-09-29 DIAGNOSIS — D508 Other iron deficiency anemias: Secondary | ICD-10-CM

## 2018-09-29 DIAGNOSIS — D509 Iron deficiency anemia, unspecified: Secondary | ICD-10-CM | POA: Diagnosis not present

## 2018-09-29 LAB — URINALYSIS, COMPLETE (UACMP) WITH MICROSCOPIC
BILIRUBIN URINE: NEGATIVE
Glucose, UA: NEGATIVE mg/dL
Ketones, ur: NEGATIVE mg/dL
Nitrite: NEGATIVE
PH: 5 (ref 5.0–8.0)
Protein, ur: 100 mg/dL — AB
SPECIFIC GRAVITY, URINE: 1.015 (ref 1.005–1.030)
WBC, UA: >50 WBC/hpf — ABNORMAL HIGH (ref 0–5)

## 2018-09-29 MED ORDER — SODIUM CHLORIDE 0.9 % IV SOLN
510.0000 mg | Freq: Once | INTRAVENOUS | Status: AC
  Administered 2018-09-29: 510 mg via INTRAVENOUS
  Filled 2018-09-29: qty 17

## 2018-09-29 MED ORDER — SODIUM CHLORIDE 0.9 % IV SOLN
Freq: Once | INTRAVENOUS | Status: AC
  Administered 2018-09-29: 15:00:00 via INTRAVENOUS
  Filled 2018-09-29: qty 250

## 2018-09-29 NOTE — Patient Instructions (Signed)
#  Recommend holding Altace/blood pressure medication.  #

## 2018-09-29 NOTE — Progress Notes (Signed)
[facility] OFFICE PROGRESS NOTE  Patient Care Team: Einar Pheasant, MD as PCP - General (Internal Medicine) Einar Pheasant, MD (Internal Medicine) Leia Alf, MD (Inactive) as Referring Physician (Internal Medicine) Isaias Cowman, MD (Internal Medicine) Albesa Seen, MD (Unknown Physician Specialty) Philis Kendall, MD (Unknown Physician Specialty) Brendolyn Patty, MD (Specialist) Schnier, Dolores Lory, MD (Vascular Surgery) Leia Alf, MD (Inactive) as Referring Physician (Internal Medicine) Isaias Cowman, MD (Internal Medicine) Albesa Seen, MD (Unknown Physician Specialty) Philis Kendall, MD (Unknown Physician Specialty) Brendolyn Patty, MD (Specialist) Schnier, Dolores Lory, MD (Vascular Surgery)   SUMMARY OF HEMATOLOGIC HISTORY:  # IRON DEFICIENCY ANEMIA- recurrent [? GI blood loss; Dr.Elliot; AVM-capsule]; IV Ferrahem q 38m; last colo- 2012/march; EGD- [id number].   INTERVAL HISTORY:   84 -year-old female patient with above history of recurrent iron deficiency anemia needing IV iron.   Patient complains of worsening fatigue.  She also complains of worsening shortness of breath with exertion.  No blood in stools.  She is not on iron pills.   Review of Systems  Constitutional: Positive for malaise/fatigue. Negative for chills, diaphoresis, fever and weight loss.  HENT: Negative for nosebleeds and sore throat.   Eyes: Negative for double vision.  Respiratory: Positive for shortness of breath. Negative for cough, hemoptysis, sputum production and wheezing.   Cardiovascular: Negative for chest pain, palpitations, orthopnea and leg swelling.  Gastrointestinal: Negative for abdominal pain, blood in stool, constipation, diarrhea, heartburn, melena, nausea and vomiting.  Genitourinary: Negative for dysuria, frequency and urgency.  Musculoskeletal: Negative for back pain and joint pain.  Skin: Negative.  Negative for itching and rash.  Neurological:  Negative for dizziness, tingling, focal weakness, weakness and headaches.  Endo/Heme/Allergies: Does not bruise/bleed easily.  Psychiatric/Behavioral: Negative for depression. The patient is not nervous/anxious and does not have insomnia.      PAST MEDICAL HISTORY :  Past Medical History:  Diagnosis Date  . Arthritis   . AV malformation of gastrointestinal tract   . Blood in stool   . CAD (coronary artery disease)   . Carotid arterial disease ([place])   . Cataracts, bilateral   . Chronic blood loss anemia   . Chronic cystitis   . Depression   . Diabetes mellitus ([place])   . GERD (gastroesophageal reflux disease)   . Hyperlipidemia   . Hypertension   . IDA (iron deficiency anemia)   . Stress incontinence     PAST SURGICAL HISTORY :   Past Surgical History:  Procedure Laterality Date  . ABDOMINAL HYSTERECTOMY  1972   ovaries left in place  . APPENDECTOMY  1992  . BACK SURGERY  06/08/2010   L3, L4, L5  . CAROTID ARTERY ANGIOPLASTY  11/03  . [place], 92   right then left   . CHOLECYSTECTOMY  1993  . FOOT SURGERY  06/2007   Left  . right carotid artery surgery  01/09/13   Dr. Delana Meyer @ [facility]  . TOTAL HIP ARTHROPLASTY  05/03/2011   left 12, right 11/07    FAMILY HISTORY :   Family History  Problem Relation Age of Onset  . Hyperlipidemia Father   . Heart disease Father        myocardial infarction  . Hypertension Father        Parent  . Arthritis Other        parent  . Diabetes Other        nephew  . Cervical cancer Sister   . Rectal cancer Sister   .  Breast cancer Neg Hx     SOCIAL HISTORY:   Social History   Tobacco Use  . Smoking status: Former Smoker    Types: Cigarettes    Last attempt to quit: 11/19/1989    Years since quitting: 28.8  . Smokeless tobacco: Never Used  Substance Use Topics  . Alcohol use: No    Alcohol/week: 0.0 standard drinks  . Drug use: No    ALLERGIES:  is allergic to ciprofloxacin; levaquin [levofloxacin];  and tequin [gatifloxacin].  MEDICATIONS:  Current Outpatient Medications  Medication Sig Dispense Refill  . aspirin 81 MG tablet Take 81 mg by mouth daily.    . Biotin 5000 MCG CAPS Take 1 capsule by mouth daily.    . cetirizine (ZYRTEC) 10 MG tablet Take 10 mg by mouth daily.    . meloxicam (MOBIC) 15 MG tablet TAKE 1/2 TABLET (7.5 MG TOTAL) BY MOUTH 2 (TWO) TIMES DAILY AFTER MEALS    . metFORMIN (GLUCOPHAGE) 500 MG tablet TAKE 1 TABLET DAILY WITH BREAKFAST 90 tablet 2  . metoprolol succinate (TOPROL-XL) 100 MG 24 hr tablet TAKE 1 TABLET TWICE A DAY 180 tablet 4  . Omega-3 Fatty Acids (FISH OIL) 1200 MG CAPS Take 1 capsule by mouth daily.    Marland Kitchen omeprazole (PRILOSEC) 20 MG capsule Take 20 mg by mouth daily.    . ramipril (ALTACE) 10 MG capsule Take 1 capsule (10 mg total) by mouth daily. 90 capsule 3  . ranitidine (ZANTAC) 150 MG capsule Take 1 capsule (150 mg total) by mouth every evening. 90 capsule 1  . rosuvastatin (CRESTOR) 40 MG tablet Take 1 tablet (40 mg total) by mouth daily. 90 tablet 3  . ondansetron (ZOFRAN) 4 MG tablet Take 1 tablet (4 mg total) by mouth every 8 (eight) hours as needed for nausea or vomiting. (Patient not taking: Reported on 09/29/2018) 20 tablet 0  . traZODone (DESYREL) 50 MG tablet Take 0.5-1 tablets (25-50 mg total) by mouth at bedtime as needed for sleep. (Patient not taking: Reported on 09/29/2018) 90 tablet 1   No current facility-administered medications for this visit.     PHYSICAL EXAMINATION:   BP 133/69 (BP Location: Left Arm, Patient Position: Sitting)   Pulse 73   Temp 98.2 F (36.8 C)   Resp 18   Ht 4\' 10"  (1.473 m)   Wt 140 lb (63.5 kg)   BMI 29.26 kg/m   Filed Weights   09/29/18 1341  Weight: 140 lb (63.5 kg)    Physical Exam  Constitutional: She is oriented to person, place, and time and well-developed, well-nourished, and in no distress.  HENT:  Head: Normocephalic and atraumatic.  Mouth/Throat: Oropharynx is clear and moist.  No oropharyngeal exudate.  Eyes: Pupils are equal, round, and reactive to light.  Positive for pallor.  Neck: Normal range of motion. Neck supple.  Cardiovascular: Normal rate and regular rhythm.  Pulmonary/Chest: No respiratory distress. She has no wheezes.  Abdominal: Soft. Bowel sounds are normal. She exhibits no distension and no mass. There is no tenderness. There is no rebound and no guarding.  Musculoskeletal: Normal range of motion. She exhibits no edema or tenderness.  Neurological: She is alert and oriented to person, place, and time.  Skin: Skin is warm.  Psychiatric: Affect normal.    LABORATORY DATA:  I have reviewed the data as listed    Component Value Date/Time   NA 136 09/26/2018 1147   NA 143 01/10/2013 0314   K 4.5 09/26/2018 1147  K 4.0 01/10/2013 0314   CL 106 09/26/2018 1147   CL 109 (H) 01/10/2013 0314   CO2 22 09/26/2018 1147   CO2 27 01/10/2013 0314   GLUCOSE 152 (H) 09/26/2018 1147   GLUCOSE 119 (H) 01/10/2013 0314   BUN 32 (H) 09/26/2018 1147   BUN 15 01/10/2013 0314   CREATININE 1.89 (H) 09/26/2018 1147   CREATININE 1.03 01/10/2013 0314   CREATININE 1.13 (H) 09/18/2012 0848   CALCIUM 9.2 09/26/2018 1147   CALCIUM 8.1 (L) 01/10/2013 0314   PROT 6.9 09/26/2018 1147   PROT 7.2 12/20/2011 1521   ALBUMIN 3.5 09/26/2018 1147   ALBUMIN 3.9 12/20/2011 1521   AST 39 09/26/2018 1147   AST 31 12/20/2011 1521   ALT 26 09/26/2018 1147   ALT 37 12/20/2011 1521   ALKPHOS 53 09/26/2018 1147   ALKPHOS 57 12/20/2011 1521   BILITOT 0.4 09/26/2018 1147   BILITOT 0.5 12/20/2011 1521   GFRNONAA 23 (L) 09/26/2018 1147   GFRNONAA 52 (L) 01/10/2013 0314   GFRNONAA 47 (L) 09/18/2012 0848   GFRAA 27 (L) 09/26/2018 1147   GFRAA >60 01/10/2013 0314   GFRAA 54 (L) 09/18/2012 0848    No results found for: SPEP, UPEP  Lab Results  Component Value Date   WBC 7.3 09/26/2018   NEUTROABS 4.6 09/26/2018   HGB 7.8 (L) 09/26/2018   HCT 25.8 (L) 09/26/2018   MCV  86.6 09/26/2018   PLT 235 09/26/2018      Chemistry      Component Value Date/Time   NA 136 09/26/2018 1147   NA 143 01/10/2013 0314   K 4.5 09/26/2018 1147   K 4.0 01/10/2013 0314   CL 106 09/26/2018 1147   CL 109 (H) 01/10/2013 0314   CO2 22 09/26/2018 1147   CO2 27 01/10/2013 0314   BUN 32 (H) 09/26/2018 1147   BUN 15 01/10/2013 0314   CREATININE 1.89 (H) 09/26/2018 1147   CREATININE 1.03 01/10/2013 0314   CREATININE 1.13 (H) 09/18/2012 0848      Component Value Date/Time   CALCIUM 9.2 09/26/2018 1147   CALCIUM 8.1 (L) 01/10/2013 0314   ALKPHOS 53 09/26/2018 1147   ALKPHOS 57 12/20/2011 1521   AST 39 09/26/2018 1147   AST 31 12/20/2011 1521   ALT 26 09/26/2018 1147   ALT 37 12/20/2011 1521   BILITOT 0.4 09/26/2018 1147   BILITOT 0.5 12/20/2011 1521         ASSESSMENT & PLAN:   Iron deficiency anemia due to chronic blood loss # Recurrent iron deficiency anemia/question AVM-CKD: Patient quite symptomatic.  Today hemoglobin is 7.8/ ferritin 8 saturation 7%. Proceed with IV Feraheme today.  #Worsening iron deficiency-question chronic versus acute process.  Check stool occult blood.  Would recommend further work-up with GI; patient had last EGD/colonoscopy/capsule about 7 years ago.  Recommend a referral to Dr. Vira Agar.  #Acute renal failure -Elevated Creatinine- 1.89/ ? Etiology; baseline 1.2-1.3.  STOP Ramipril.  Check UA; kidney ultrasound ASAP.  Recommend increased fluid intake.  Patient not on NSAIDs.  # DISPOSITION:  # Ferrahem today; check UA today; Kidney US ASAP; stool card x3.  # Ferraheme/ cbc/bmp-HOLD tube in 1 week. # Follow up with MD/labs-cbc/bmp-possible ferrahme in 2 months-Dr.B  Cc; Dr.Scott.      Cammie Sickle, MD 09/29/2018 5:05 PM

## 2018-09-29 NOTE — Assessment & Plan Note (Addendum)
#   Recurrent iron deficiency anemia/question AVM-CKD: Patient quite symptomatic.  Today hemoglobin is 7.8/ ferritin 8 saturation 7%. Proceed with IV Feraheme today.  #Worsening iron deficiency-question chronic versus acute process.  Check stool occult blood.  Would recommend further work-up with GI; patient had last EGD/colonoscopy/capsule about 7 years ago.  Recommend a referral to Dr. Vira Agar.  #Acute renal failure -Elevated Creatinine- 1.89/ ? Etiology; baseline 1.2-1.3.  STOP Ramipril.  Check UA; kidney ultrasound ASAP.  Recommend increased fluid intake.  Patient not on NSAIDs.  # DISPOSITION:  # Ferrahem today; check UA today; Kidney US ASAP; stool card x3.  # Ferraheme/ cbc/bmp-HOLD tube in 1 week. # Follow up with MD/labs-cbc/bmp-possible ferrahme in 2 months-Dr.B  Cc; Dr.Scott.

## 2018-09-29 NOTE — Telephone Encounter (Signed)
Brooke-please make a referral to GI/Dr. Vira Agar regarding iron deficiency anemia. [Patient has been previous patient of Dr. Elliott].  Also please inform patient of my recommendation. Thanks, Dr.B

## 2018-09-29 NOTE — Progress Notes (Signed)
No new changes noted today 

## 2018-09-30 ENCOUNTER — Telehealth: Payer: Self-pay | Admitting: Internal Medicine

## 2018-09-30 ENCOUNTER — Other Ambulatory Visit: Payer: Self-pay | Admitting: Internal Medicine

## 2018-09-30 DIAGNOSIS — D500 Iron deficiency anemia secondary to blood loss (chronic): Secondary | ICD-10-CM

## 2018-09-30 MED ORDER — NITROFURANTOIN MONOHYD MACRO 100 MG PO CAPS: 100.0000 mg | ORAL_CAPSULE | Freq: Two times a day (BID) | ORAL | 0 refills | Status: DC

## 2018-09-30 NOTE — Telephone Encounter (Signed)
Per Josh in the lab, the urine sample will be too old to send out for a culture now. I notified patient of Dr. Sharmaine Base recommendations, and notified her of the Rx that was sent to the pharmacy, and patient verbalized understanding. I advised patient that if UTI sx do not improve, to call us and let us know. Thanks!

## 2018-09-30 NOTE — Telephone Encounter (Signed)
Referral has been submitted and patient has been notified and verbalized understanding.

## 2018-09-30 NOTE — Telephone Encounter (Signed)
Brooke- Please check with lab- if we can send urine culture of the sample from yesterday.   #Please inform patient that she has a UTI-recommend Macrobid; and I sent prescription to pharmacy.  Thx  Dr.Scott- Juluis Rainier

## 2018-10-03 ENCOUNTER — Ambulatory Visit
Admission: RE | Admit: 2018-10-03 | Discharge: 2018-10-03 | Disposition: A | Discharge status: Home / Self Care | Payer: Medicare Other | Source: Ambulatory Visit | Attending: Internal Medicine | Admitting: Internal Medicine

## 2018-10-03 DIAGNOSIS — D500 Iron deficiency anemia secondary to blood loss (chronic): Secondary | ICD-10-CM | POA: Diagnosis not present

## 2018-10-03 DIAGNOSIS — N179 Acute kidney failure, unspecified: Secondary | ICD-10-CM | POA: Diagnosis not present

## 2018-10-05 DIAGNOSIS — D509 Iron deficiency anemia, unspecified: Secondary | ICD-10-CM | POA: Diagnosis not present

## 2018-10-06 ENCOUNTER — Other Ambulatory Visit: Payer: Self-pay

## 2018-10-06 ENCOUNTER — Inpatient Hospital Stay: Payer: Medicare Other

## 2018-10-06 ENCOUNTER — Other Ambulatory Visit: Payer: Self-pay | Admitting: *Deleted

## 2018-10-06 VITALS — BP 108/76 | HR 64 | Temp 97.7°F | Resp 18

## 2018-10-06 DIAGNOSIS — D508 Other iron deficiency anemias: Secondary | ICD-10-CM

## 2018-10-06 DIAGNOSIS — D509 Iron deficiency anemia, unspecified: Secondary | ICD-10-CM | POA: Diagnosis not present

## 2018-10-06 DIAGNOSIS — D500 Iron deficiency anemia secondary to blood loss (chronic): Secondary | ICD-10-CM

## 2018-10-06 DIAGNOSIS — N179 Acute kidney failure, unspecified: Secondary | ICD-10-CM

## 2018-10-06 LAB — CBC WITH DIFFERENTIAL/PLATELET
ABS IMMATURE GRANULOCYTES: 0.04 10*3/uL (ref 0.00–0.07)
Basophils Absolute: 0.1 10*3/uL (ref 0.0–0.1)
Basophils Relative: 1 %
Eosinophils Absolute: 0.2 10*3/uL (ref 0.0–0.5)
Eosinophils Relative: 2 %
HEMATOCRIT: 27.3 % — AB (ref 36.0–46.0)
HEMOGLOBIN: 8.4 g/dL — AB (ref 12.0–15.0)
IMMATURE GRANULOCYTES: 0 %
LYMPHS ABS: 2.2 10*3/uL (ref 0.7–4.0)
Lymphocytes Relative: 23 %
MCH: 27.7 pg (ref 26.0–34.0)
MCHC: 30.8 g/dL (ref 30.0–36.0)
MCV: 90.1 fL (ref 80.0–100.0)
MONO ABS: 0.6 10*3/uL (ref 0.1–1.0)
MONOS PCT: 6 %
NEUTROS ABS: 6.4 10*3/uL (ref 1.7–7.7)
Neutrophils Relative %: 68 %
Platelets: 238 10*3/uL (ref 150–400)
RBC: 3.03 MIL/uL — ABNORMAL LOW (ref 3.87–5.11)
RDW: 18.3 % — ABNORMAL HIGH (ref 11.5–15.5)
WBC: 9.5 10*3/uL (ref 4.0–10.5)
nRBC: 0 % (ref 0.0–0.2)

## 2018-10-06 LAB — BASIC METABOLIC PANEL
Anion gap: 9 (ref 5–15)
BUN: 27 mg/dL — AB (ref 8–23)
CO2: 24 mmol/L (ref 22–32)
Calcium: 9.4 mg/dL (ref 8.9–10.3)
Chloride: 105 mmol/L (ref 98–111)
Creatinine, Ser: 1.58 mg/dL — ABNORMAL HIGH (ref 0.44–1.00)
GFR calc Af Amer: 34 mL/min — ABNORMAL LOW (ref >60)
GFR calc non Af Amer: 29 mL/min — ABNORMAL LOW (ref >60)
GLUCOSE: 168 mg/dL — AB (ref 70–99)
POTASSIUM: 4.2 mmol/L (ref 3.5–5.1)
Sodium: 138 mmol/L (ref 135–145)

## 2018-10-06 LAB — SAMPLE TO BLOOD BANK

## 2018-10-06 LAB — OCCULT BLOOD X 1 CARD TO LAB, STOOL
FECAL OCCULT BLD: POSITIVE — AB
FECAL OCCULT BLD: NEGATIVE
FECAL OCCULT BLD: NEGATIVE

## 2018-10-06 MED ORDER — SODIUM CHLORIDE 0.9 % IV SOLN
Freq: Once | INTRAVENOUS | Status: AC
  Administered 2018-10-06: 15:00:00 via INTRAVENOUS
  Filled 2018-10-06: qty 250

## 2018-10-06 MED ORDER — SODIUM CHLORIDE 0.9 % IV SOLN
510.0000 mg | Freq: Once | INTRAVENOUS | Status: AC
  Administered 2018-10-06: 510 mg via INTRAVENOUS
  Filled 2018-10-06: qty 17

## 2018-10-07 ENCOUNTER — Other Ambulatory Visit: Payer: Self-pay | Admitting: Internal Medicine

## 2018-10-07 ENCOUNTER — Telehealth: Payer: Self-pay

## 2018-10-07 DIAGNOSIS — D649 Anemia, unspecified: Secondary | ICD-10-CM

## 2018-10-07 NOTE — Telephone Encounter (Signed)
There are fasting labs already ordered.  The labs ordered 06/14/18 need to be drawn.  I have added cbc and ferritin.  Please make sure lab knows that the cbc and ferritin and all the fasting labs from 06/14/18 need to be drawn.

## 2018-10-07 NOTE — Telephone Encounter (Signed)
Copied from [place] [phone]. Topic: General - Other >> Oct 07, 2018  2:26 PM Burchel, Abbi R wrote: Pt requesting to have hemoglobin checked on Monday when she comes in for her labs.

## 2018-10-07 NOTE — Telephone Encounter (Signed)
Copied from [place] [phone]. Topic: Quick Communication - Rx Refill/Question >> Oct 07, 2018  2:26 PM Burchel, Abbi R wrote: Medication: traZODone (DESYREL) 50 MG tablet   Preferred Pharmacy: [id number] - [place], [place] [address] [place] [address] Phone: [phone] Fax: [phone]    Pt was advised that RX refills may take up to 3 business days. We ask that you follow-up with your pharmacy.

## 2018-10-07 NOTE — Progress Notes (Signed)
Order placed for cbc and ferritin.

## 2018-10-07 NOTE — Telephone Encounter (Signed)
Patient had labs done yesterday ordered by oncology and hemoglobin was 8.4. She has a lab appt on Monday and an appt with you on 11/26. Just wanted to be sure that we would be doing another CBC before calling the patient to let her know.

## 2018-10-08 ENCOUNTER — Telehealth: Payer: Self-pay | Admitting: Internal Medicine

## 2018-10-08 MED ORDER — TRAZODONE HCL 50 MG PO TABS: 25.0000 mg | ORAL_TABLET | Freq: Every evening | ORAL | 0 refills | Status: DC | PRN

## 2018-10-08 NOTE — Telephone Encounter (Signed)
Noted on lab schedule as well

## 2018-10-08 NOTE — Telephone Encounter (Signed)
FYI -Dr. Nicki Reaper.    I discussed with the patient at length the issues below. # recent UTI [no cultures done]- s/p bactrim; 5 days-if still symptomatic.  Recommend repeat UA and culture; patient awaiting visit with PCP next week.  Defer to PCP next week  # discussed re: stool positive card; awaiting evaluation with Dr. Vira Agar on December 5  #Creatinine 1.8; repeat 1.5.  Ultrasound kidneys no obstruction.  Recommend increase fluids  #Follow-up with me as planned.

## 2018-10-08 NOTE — Telephone Encounter (Signed)
Patient is aware 

## 2018-10-13 ENCOUNTER — Other Ambulatory Visit (INDEPENDENT_AMBULATORY_CARE_PROVIDER_SITE_OTHER): Payer: Medicare Other

## 2018-10-13 DIAGNOSIS — I10 Essential (primary) hypertension: Secondary | ICD-10-CM

## 2018-10-13 DIAGNOSIS — R8290 Unspecified abnormal findings in urine: Secondary | ICD-10-CM | POA: Diagnosis not present

## 2018-10-13 DIAGNOSIS — E119 Type 2 diabetes mellitus without complications: Secondary | ICD-10-CM | POA: Diagnosis not present

## 2018-10-13 DIAGNOSIS — E7800 Pure hypercholesterolemia, unspecified: Secondary | ICD-10-CM | POA: Diagnosis not present

## 2018-10-13 DIAGNOSIS — D649 Anemia, unspecified: Secondary | ICD-10-CM | POA: Diagnosis not present

## 2018-10-13 LAB — POCT URINALYSIS DIP (MANUAL ENTRY)
BILIRUBIN UA: NEGATIVE
GLUCOSE UA: NEGATIVE mg/dL
Ketones, POC UA: NEGATIVE mg/dL
NITRITE UA: NEGATIVE
PH UA: 5 (ref 5.0–8.0)
Protein Ur, POC: 100 mg/dL — AB
Spec Grav, UA: <=1.005 — AB (ref 1.010–1.025)
Urobilinogen, UA: 0.2 E.U./dL

## 2018-10-13 LAB — BASIC METABOLIC PANEL
BUN: 21 mg/dL (ref 6–23)
CO2: 24 meq/L (ref 19–32)
CREATININE: 1.65 mg/dL — AB (ref 0.40–1.20)
Calcium: 9.1 mg/dL (ref 8.4–10.5)
Chloride: 107 mEq/L (ref 96–112)
GFR: 31.47 mL/min — ABNORMAL LOW (ref >60.00)
Glucose, Bld: 122 mg/dL — ABNORMAL HIGH (ref 70–99)
Potassium: 4.2 mEq/L (ref 3.5–5.1)
Sodium: 140 mEq/L (ref 135–145)

## 2018-10-13 LAB — CBC WITH DIFFERENTIAL/PLATELET
BASOS ABS: 0.1 10*3/uL (ref 0.0–0.1)
BASOS PCT: 1.1 % (ref 0.0–3.0)
EOS ABS: 0.2 10*3/uL (ref 0.0–0.7)
Eosinophils Relative: 3 % (ref 0.0–5.0)
HCT: 28.3 % — ABNORMAL LOW (ref 36.0–46.0)
Hemoglobin: 9.2 g/dL — ABNORMAL LOW (ref 12.0–15.0)
LYMPHS ABS: 1.5 10*3/uL (ref 0.7–4.0)
Lymphocytes Relative: 24.6 % (ref 12.0–46.0)
MCHC: 32.3 g/dL (ref 30.0–36.0)
MCV: 91.1 fl (ref 78.0–100.0)
Monocytes Absolute: 0.3 10*3/uL (ref 0.1–1.0)
Monocytes Relative: 5.2 % (ref 3.0–12.0)
NEUTROS ABS: 4.1 10*3/uL (ref 1.4–7.7)
Neutrophils Relative %: 66.1 % (ref 43.0–77.0)
PLATELETS: 165 10*3/uL (ref 150.0–400.0)
RBC: 3.11 Mil/uL — ABNORMAL LOW (ref 3.87–5.11)
RDW: 25.6 % — AB (ref 11.5–15.5)
WBC: 6.1 10*3/uL (ref 4.0–10.5)

## 2018-10-13 LAB — MICROALBUMIN / CREATININE URINE RATIO
CREATININE, U: 27.1 mg/dL
MICROALB/CREAT RATIO: 34.5 mg/g — AB (ref 0.0–30.0)
Microalb, Ur: 9.3 mg/dL — ABNORMAL HIGH (ref 0.0–1.9)

## 2018-10-13 LAB — LIPID PANEL
Cholesterol: 127 mg/dL (ref 0–200)
HDL: 47 mg/dL (ref >39.00)
LDL Cholesterol: 47 mg/dL (ref 0–99)
NONHDL: 80.43
Total CHOL/HDL Ratio: 3
Triglycerides: 166 mg/dL — ABNORMAL HIGH (ref 0.0–149.0)
VLDL: 33.2 mg/dL (ref 0.0–40.0)

## 2018-10-13 LAB — HEPATIC FUNCTION PANEL
ALBUMIN: 3.8 g/dL (ref 3.5–5.2)
ALK PHOS: 50 U/L (ref 39–117)
ALT: 21 U/L (ref 0–35)
AST: 24 U/L (ref 0–37)
Bilirubin, Direct: 0.1 mg/dL (ref 0.0–0.3)
Total Bilirubin: 0.4 mg/dL (ref 0.2–1.2)
Total Protein: 6.6 g/dL (ref 6.0–8.3)

## 2018-10-13 LAB — URINALYSIS, MICROSCOPIC ONLY

## 2018-10-13 LAB — HEMOGLOBIN A1C: HEMOGLOBIN A1C: 6.2 % (ref 4.6–6.5)

## 2018-10-13 LAB — FERRITIN: Ferritin: 331.1 ng/mL — ABNORMAL HIGH (ref 10.0–291.0)

## 2018-10-13 LAB — TSH: TSH: 1.6 u[IU]/mL (ref 0.35–4.50)

## 2018-10-13 NOTE — Addendum Note (Signed)
Addended by: Leeanne Rio on: 10/13/2018 11:16 AM   Modules accepted: Orders

## 2018-10-14 ENCOUNTER — Ambulatory Visit: Payer: Medicare Other | Admitting: Internal Medicine

## 2018-10-14 DIAGNOSIS — N183 Chronic kidney disease, stage 3 unspecified: Secondary | ICD-10-CM

## 2018-10-14 DIAGNOSIS — I739 Peripheral vascular disease, unspecified: Secondary | ICD-10-CM

## 2018-10-14 DIAGNOSIS — E119 Type 2 diabetes mellitus without complications: Secondary | ICD-10-CM | POA: Diagnosis not present

## 2018-10-14 DIAGNOSIS — Z23 Encounter for immunization: Secondary | ICD-10-CM | POA: Diagnosis not present

## 2018-10-14 DIAGNOSIS — I10 Essential (primary) hypertension: Secondary | ICD-10-CM

## 2018-10-14 DIAGNOSIS — D649 Anemia, unspecified: Secondary | ICD-10-CM

## 2018-10-14 DIAGNOSIS — I779 Disorder of arteries and arterioles, unspecified: Secondary | ICD-10-CM

## 2018-10-14 DIAGNOSIS — I2510 Atherosclerotic heart disease of native coronary artery without angina pectoris: Secondary | ICD-10-CM | POA: Diagnosis not present

## 2018-10-14 DIAGNOSIS — E7800 Pure hypercholesterolemia, unspecified: Secondary | ICD-10-CM

## 2018-10-14 LAB — HM DIABETES FOOT EXAM

## 2018-10-14 NOTE — Patient Instructions (Signed)
Stop metformin 

## 2018-10-14 NOTE — Progress Notes (Signed)
Patient ID: Jocelyn Elliott, female   DOB: Dec 01, 1931, 84 y.o.   MRN: [id number]   Subjective:    Patient ID: Jocelyn Elliott, female    DOB: [birth year], 84 y.o.   MRN: [id number]  HPI  Patient here for a scheduled follow up.  She reports that last month, she was having epigastric pain.  Some loose stool previously.  States this lasted for a few weeks.  Increased fatigue.  Felt bad.  Saw Dr Rogue Bussing.  hgb 8.4.  Heme positive stool test.  Referred back to Dr Tiffany Kocher.  Has appt 10/23/18.  States feeling better.  No epigastric pain now.  No abdominal pain.  Eating.  Energy better.  Breathing stable.  No chest pain.  Bowels stable.  Discussed worsening renal function.  a1c improved.  Will stop metformin.  She is watching her diet.     Past Medical History:  Diagnosis Date  . Arthritis   . AV malformation of gastrointestinal tract   . Blood in stool   . CAD (coronary artery disease)   . Carotid arterial disease ([place])   . Cataracts, bilateral   . Chronic blood loss anemia   . Chronic cystitis   . Depression   . Diabetes mellitus ([place])   . GERD (gastroesophageal reflux disease)   . Hyperlipidemia   . Hypertension   . IDA (iron deficiency anemia)   . Stress incontinence    Past Surgical History:  Procedure Laterality Date  . ABDOMINAL HYSTERECTOMY  1972   ovaries left in place  . APPENDECTOMY  1992  . BACK SURGERY  06/08/2010   L3, L4, L5  . CAROTID ARTERY ANGIOPLASTY  11/03  . [place], 92   right then left   . CHOLECYSTECTOMY  1993  . FOOT SURGERY  06/2007   Left  . right carotid artery surgery  01/09/13   Dr. Delana Meyer @ [facility]  . TOTAL HIP ARTHROPLASTY  05/03/2011   left 12, right 11/07   Family History  Problem Relation Age of Onset  . Hyperlipidemia Father   . Heart disease Father        myocardial infarction  . Hypertension Father        Parent  . Arthritis Other        parent  . Diabetes Other        nephew  . Cervical cancer Sister   . Rectal  cancer Sister   . Breast cancer Neg Hx    Social History   Socioeconomic History  . Marital status: Widowed    Spouse name: Not on file  . Number of children: 1  . Years of education: 70  . Highest education level: Not on file  Occupational History  . Occupation: Retired  Scientific laboratory technician  . Financial resource strain: Not on file  . Food insecurity:    Worry: Not on file    Inability: Not on file  . Transportation needs:    Medical: Not on file    Non-medical: Not on file  Tobacco Use  . Smoking status: Former Smoker    Types: Cigarettes    Last attempt to quit: 11/19/1989    Years since quitting: 28.9  . Smokeless tobacco: Never Used  Substance and Sexual Activity  . Alcohol use: No    Alcohol/week: 0.0 standard drinks  . Drug use: No  . Sexual activity: Not on file  Lifestyle  . Physical activity:    Days per week: Not on  file    Minutes per session: Not on file  . Stress: Not on file  Relationships  . Social connections:    Talks on phone: Not on file    Gets together: Not on file    Attends religious service: Not on file    Active member of club or organization: Not on file    Attends meetings of clubs or organizations: Not on file    Relationship status: Not on file  Other Topics Concern  . Not on file  Social History Narrative   Regular exercise-no   Caffeine Use-yes    Outpatient Encounter Medications as of 10/14/2018  Medication Sig  . aspirin 81 MG tablet Take 81 mg by mouth daily.  . Biotin 5000 MCG CAPS Take 1 capsule by mouth daily.  . cetirizine (ZYRTEC) 10 MG tablet Take 10 mg by mouth daily.  . metoprolol succinate (TOPROL-XL) 100 MG 24 hr tablet TAKE 1 TABLET TWICE A DAY  . Omega-3 Fatty Acids (FISH OIL) 1200 MG CAPS Take 1 capsule by mouth daily.  Marland Kitchen omeprazole (PRILOSEC) 20 MG capsule Take 20 mg by mouth daily.  . ondansetron (ZOFRAN) 4 MG tablet Take 1 tablet (4 mg total) by mouth every 8 (eight) hours as needed for nausea or vomiting. (Patient  not taking: Reported on 09/29/2018)  . ramipril (ALTACE) 10 MG capsule Take 1 capsule (10 mg total) by mouth daily.  . rosuvastatin (CRESTOR) 40 MG tablet Take 1 tablet (40 mg total) by mouth daily.  . traZODone (DESYREL) 50 MG tablet Take 0.5-1 tablets (25-50 mg total) by mouth at bedtime as needed for sleep.  . [DISCONTINUED] meloxicam (MOBIC) 15 MG tablet TAKE 1/2 TABLET (7.5 MG TOTAL) BY MOUTH 2 (TWO) TIMES DAILY AFTER MEALS  . [DISCONTINUED] metFORMIN (GLUCOPHAGE) 500 MG tablet TAKE 1 TABLET DAILY WITH BREAKFAST  . [DISCONTINUED] nitrofurantoin, macrocrystal-monohydrate, (MACROBID) 100 MG capsule Take 1 capsule (100 mg total) by mouth 2 (two) times daily.  . [DISCONTINUED] ranitidine (ZANTAC) 150 MG capsule Take 1 capsule (150 mg total) by mouth every evening.   No facility-administered encounter medications on file as of 10/14/2018.     Review of Systems  Constitutional: Negative for unexpected weight change.       Eating.  Energy better.    HENT: Negative for congestion and sinus pressure.   Respiratory: Negative for cough, chest tightness and shortness of breath.   Cardiovascular: Negative for chest pain, palpitations and leg swelling.  Gastrointestinal: Negative for abdominal pain, nausea and vomiting.       No loose stool now.    Genitourinary: Negative for difficulty urinating and dysuria.  Musculoskeletal: Negative for joint swelling and myalgias.  Skin: Negative for color change and rash.  Neurological: Negative for dizziness, light-headedness and headaches.  Psychiatric/Behavioral: Negative for agitation and dysphoric mood.       Objective:    Physical Exam  Constitutional: She appears well-developed and well-nourished. No distress.  HENT:  Nose: Nose normal.  Mouth/Throat: Oropharynx is clear and moist.  Neck: Neck supple. No thyromegaly present.  Cardiovascular: Normal rate and regular rhythm.  Pulmonary/Chest: Breath sounds normal. No respiratory distress. She  has no wheezes.  Abdominal: Soft. Bowel sounds are normal. There is no tenderness.  Musculoskeletal: She exhibits no edema or tenderness.  Feet:  No lesions.  DP pulses palpable and equal bilaterally.  Intact to light touch and pin prick.    Lymphadenopathy:    She has no cervical adenopathy.  Skin: No rash  noted. No erythema.  Psychiatric: She has a normal mood and affect. Her behavior is normal.    BP 134/72 (BP Location: Left Arm, Patient Position: Sitting, Cuff Size: Normal)   Pulse 81   Temp 98.2 F (36.8 C) (Oral)   Resp 18   Wt 138 lb 6.4 oz (62.8 kg)   SpO2 95%   BMI 28.93 kg/m  Wt Readings from Last 3 Encounters:  10/14/18 138 lb 6.4 oz (62.8 kg)  09/29/18 140 lb (63.5 kg)  07/28/18 138 lb (62.6 kg)     Lab Results  Component Value Date   WBC 6.1 10/13/2018   HGB 9.2 (L) 10/13/2018   HCT 28.3 (L) 10/13/2018   PLT 165.0 10/13/2018   GLUCOSE 122 (H) 10/13/2018   CHOL 127 10/13/2018   TRIG 166.0 (H) 10/13/2018   HDL 47.00 10/13/2018   LDLDIRECT 104.0 06/09/2018   LDLCALC 47 10/13/2018   ALT 21 10/13/2018   AST 24 10/13/2018   NA 140 10/13/2018   K 4.2 10/13/2018   CL 107 10/13/2018   CREATININE 1.65 (H) 10/13/2018   BUN 21 10/13/2018   CO2 24 10/13/2018   TSH 1.60 10/13/2018   INR 1.0 01/10/2013   HGBA1C 6.2 10/13/2018   MICROALBUR 9.3 (H) 10/13/2018    US Renal  Result Date: 10/04/2018 CLINICAL DATA:  Acute renal failure EXAM: RENAL / URINARY TRACT ULTRASOUND COMPLETE COMPARISON:  CT 06/12/2015 FINDINGS: Right Kidney: Renal measurements: 10.1 cm length by 3.7 cm height by 3.3 cm wide = volume: 63.8 mL . Echogenicity within normal limits. No mass or hydronephrosis visualized. Left Kidney: Renal measurements: 9.4 cm length by 4.1 cm height by 3.5 cm wide = volume: 71 mL. Echogenicity within normal limits. No mass or hydronephrosis visualized. Bladder: Appears normal for degree of bladder distention. IMPRESSION: Negative renal ultrasound Electronically  Signed   By: Donavan Foil M.D.   On: 10/04/2018 19:53       Assessment & Plan:   Problem List Items Addressed This Visit    Anemia    Has been followed by hematology.  Received recent iron infusions.  hgb decreased to 8.4.  Recent epigastric pain and black stool.  No pain now.  Bowels back to her baseline.  Has f/u with GI next week.  Continue f/u with hematology.  Recheck cbc today.        CAD (coronary artery disease)    Followed by cardiology.  Stable.        Carotid arterial disease ([place])    Followed by vascular surgery.        CKD (chronic kidney disease) stage 3, GFR 30-59 ml/min (HCC)    Recent decrease GFR.  Avoid antiinflammatories.  Stop metformin.        Diabetes mellitus ([place])    Low carb diet and exercise.  Recent a1c improved.  Stop metformin.  Follow met b and a1c.       Relevant Orders   Hemoglobin P5K   Basic metabolic panel   Hypercholesteremia    On crestor.  Follow lipid panel and liver function tests.        Relevant Orders   Lipid panel   Hepatic function panel   Hypertension    Blood pressure under good control.  Continue same medication regimen.  Follow pressures.  Follow metabolic panel.         Other Visit Diagnoses    Encounter for immunization       Relevant Orders   Flu  vaccine HIGH DOSE PF (Completed)       Einar Pheasant, MD

## 2018-10-15 ENCOUNTER — Ambulatory Visit: Payer: Self-pay | Admitting: *Deleted

## 2018-10-15 ENCOUNTER — Other Ambulatory Visit: Payer: Self-pay

## 2018-10-15 LAB — URINE CULTURE
MICRO NUMBER:: 91418265
SPECIMEN QUALITY: ADEQUATE

## 2018-10-15 MED ORDER — CEFDINIR 300 MG PO CAPS: 300.0000 mg | ORAL_CAPSULE | Freq: Two times a day (BID) | ORAL | 0 refills | Status: DC

## 2018-10-15 NOTE — Telephone Encounter (Signed)
Pt called with wanting to know how to take a pro biotic. Per my reference, she should try to take it 20 mins after eating, or first thing in the morning or at night before bedtime. So the gut will have time to absorb it. Suggested over the counter probiotic or Activa yogurt. Pt voiced understanding

## 2018-10-18 ENCOUNTER — Encounter: Payer: Self-pay | Admitting: Internal Medicine

## 2018-10-18 DIAGNOSIS — N1832 Chronic kidney disease, stage 3b: Secondary | ICD-10-CM | POA: Insufficient documentation

## 2018-10-18 DIAGNOSIS — N183 Chronic kidney disease, stage 3 unspecified: Secondary | ICD-10-CM | POA: Insufficient documentation

## 2018-10-18 NOTE — Assessment & Plan Note (Signed)
On crestor.  Follow lipid panel and liver function tests.   

## 2018-10-18 NOTE — Assessment & Plan Note (Signed)
Followed by vascular surgery. 

## 2018-10-18 NOTE — Assessment & Plan Note (Signed)
Low carb diet and exercise.  Recent a1c improved.  Stop metformin.  Follow met b and a1c.

## 2018-10-18 NOTE — Assessment & Plan Note (Signed)
Blood pressure under good control.  Continue same medication regimen.  Follow pressures.  Follow metabolic panel.   

## 2018-10-18 NOTE — Assessment & Plan Note (Signed)
Has been followed by hematology.  Received recent iron infusions.  hgb decreased to 8.4.  Recent epigastric pain and black stool.  No pain now.  Bowels back to her baseline.  Has f/u with GI next week.  Continue f/u with hematology.  Recheck cbc today.

## 2018-10-18 NOTE — Assessment & Plan Note (Signed)
Followed by cardiology. Stable.   

## 2018-10-18 NOTE — Assessment & Plan Note (Signed)
Recent decrease GFR.  Avoid antiinflammatories.  Stop metformin.

## 2018-10-22 LAB — HM DIABETES EYE EXAM

## 2018-11-28 ENCOUNTER — Other Ambulatory Visit: Payer: Self-pay

## 2018-11-28 DIAGNOSIS — D500 Iron deficiency anemia secondary to blood loss (chronic): Secondary | ICD-10-CM

## 2018-12-01 ENCOUNTER — Ambulatory Visit: Payer: Self-pay

## 2018-12-01 ENCOUNTER — Other Ambulatory Visit: Payer: Self-pay

## 2018-12-01 ENCOUNTER — Telehealth (INDEPENDENT_AMBULATORY_CARE_PROVIDER_SITE_OTHER): Payer: Self-pay

## 2018-12-01 ENCOUNTER — Emergency Department
Admission: EM | Admit: 2018-12-01 | Discharge: 2018-12-01 | Disposition: A | Discharge status: Home / Self Care | Payer: Medicare Other | Attending: Student in an Organized Health Care Education/Training Program | Admitting: Student in an Organized Health Care Education/Training Program

## 2018-12-01 ENCOUNTER — Emergency Department: Payer: Medicare Other

## 2018-12-01 DIAGNOSIS — Z79899 Other long term (current) drug therapy: Secondary | ICD-10-CM | POA: Diagnosis not present

## 2018-12-01 DIAGNOSIS — I2510 Atherosclerotic heart disease of native coronary artery without angina pectoris: Secondary | ICD-10-CM | POA: Insufficient documentation

## 2018-12-01 DIAGNOSIS — R519 Headache, unspecified: Secondary | ICD-10-CM

## 2018-12-01 DIAGNOSIS — E119 Type 2 diabetes mellitus without complications: Secondary | ICD-10-CM | POA: Diagnosis not present

## 2018-12-01 DIAGNOSIS — R51 Headache: Secondary | ICD-10-CM | POA: Insufficient documentation

## 2018-12-01 DIAGNOSIS — N183 Chronic kidney disease, stage 3 (moderate): Secondary | ICD-10-CM | POA: Insufficient documentation

## 2018-12-01 DIAGNOSIS — I129 Hypertensive chronic kidney disease with stage 1 through stage 4 chronic kidney disease, or unspecified chronic kidney disease: Secondary | ICD-10-CM | POA: Diagnosis not present

## 2018-12-01 DIAGNOSIS — Z7982 Long term (current) use of aspirin: Secondary | ICD-10-CM | POA: Diagnosis not present

## 2018-12-01 DIAGNOSIS — Z87891 Personal history of nicotine dependence: Secondary | ICD-10-CM | POA: Insufficient documentation

## 2018-12-01 NOTE — ED Triage Notes (Addendum)
Pt c/o headache to the left side of her head to her ear from the top for the past week. States she has a hx of Bl carotid artery surgery. States she gets relief with tylenol and a warm compress

## 2018-12-01 NOTE — Telephone Encounter (Signed)
Patient called and left a message on the triage line and stated that she feels she needs an ultrasound because she is having pain in her neck near the incision of her surgery done by Dr. Ronalee Belts. She also stated she has a bad headache and she feels it is all related. Please advise

## 2018-12-01 NOTE — ED Provider Notes (Signed)
[facility] Emergency Department Provider Note    First MD Initiated Contact with Patient 12/01/18 1751     (approximate)  I have reviewed the triage vital signs and the nursing notes.   HISTORY  Chief Complaint Headache    HPI Jocelyn Elliott is a 85 y.o. female   the below listed past medical history presents the ER for 1 week of episodic left posterior headache.  States that happens 2-3 times daily.  States that there is one point that is typically tender.  Lasts roughly 15 to 30 minutes.  She can put warm compress on that side and close her eyes and it resolves.  Denies any numbness or tingling.  States that intermittently she will have some pain in the left ear but denies any fevers.  No sore throat.  No blurry vision.  No chest pain or shortness of breath.   Past Medical History:  Diagnosis Date  . Arthritis   . AV malformation of gastrointestinal tract   . Blood in stool   . CAD (coronary artery disease)   . Carotid arterial disease ([place])   . Cataracts, bilateral   . Chronic blood loss anemia   . Chronic cystitis   . Depression   . Diabetes mellitus ([place])   . GERD (gastroesophageal reflux disease)   . Hyperlipidemia   . Hypertension   . IDA (iron deficiency anemia)   . Stress incontinence    Family History  Problem Relation Age of Onset  . Hyperlipidemia Father   . Heart disease Father        myocardial infarction  . Hypertension Father        Parent  . Arthritis Other        parent  . Diabetes Other        nephew  . Cervical cancer Sister   . Rectal cancer Sister   . Breast cancer Neg Hx    Past Surgical History:  Procedure Laterality Date  . ABDOMINAL HYSTERECTOMY  1972   ovaries left in place  . APPENDECTOMY  1992  . BACK SURGERY  06/08/2010   L3, L4, L5  . CAROTID ARTERY ANGIOPLASTY  11/03  . [place], 92   right then left   . CHOLECYSTECTOMY  1993  . FOOT SURGERY  06/2007   Left  . right  carotid artery surgery  01/09/13   Dr. Delana Meyer @ [facility]  . TOTAL HIP ARTHROPLASTY  05/03/2011   left 12, right 11/07   Patient Active Problem List   Diagnosis Date Noted  . CKD (chronic kidney disease) stage 3, GFR 30-59 ml/min (HCC) 10/18/2018  . Rash 06/14/2018  . Dizziness 05/22/2017  . Iron deficiency anemia due to chronic blood loss 09/20/2016  . Hypotension 04/06/2016  . Respiratory infection 04/06/2016  . Health care maintenance 10/28/2015  . SOB (shortness of breath) on exertion 09/20/2015  . Knee pain, bilateral 09/20/2015  . Sleeping difficulty 07/24/2015  . Hepatomegaly 07/24/2015  . Generalized anxiety disorder 04/23/2015  . UTI (urinary tract infection) 05/22/2014  . Hip pain 10/11/2013  . Tendonitis 07/19/2013  . Leg numbness 04/07/2013  . CAD (coronary artery disease) 09/05/2012  . Hypertension 09/05/2012  . Hypercholesteremia 09/05/2012  . Diabetes mellitus ([place]) 09/05/2012  . Carotid arterial disease ([place]) 09/05/2012  . Anemia 09/05/2012      Prior to Admission medications   Medication Sig Start Date End Date Taking? Authorizing Provider  aspirin 81 MG tablet Take  81 mg by mouth daily.    [provider]  Biotin 5000 MCG CAPS Take 1 capsule by mouth daily.    [provider]  cefdinir (OMNICEF) 300 MG capsule Take 1 capsule (300 mg total) by mouth 2 (two) times daily. 10/15/18   Einar Pheasant, MD  cetirizine (ZYRTEC) 10 MG tablet Take 10 mg by mouth daily.    [provider]  metoprolol succinate (TOPROL-XL) 100 MG 24 hr tablet TAKE 1 TABLET TWICE A DAY 08/11/18   Einar Pheasant, MD  Omega-3 Fatty Acids (FISH OIL) 1200 MG CAPS Take 1 capsule by mouth daily.    [provider]  omeprazole (PRILOSEC) 20 MG capsule Take 20 mg by mouth daily.    [provider]  ondansetron (ZOFRAN) 4 MG tablet Take 1 tablet (4 mg total) by mouth every 8 (eight) hours as needed for nausea or vomiting. Patient not taking: Reported on  09/29/2018 07/10/18   Jodelle Green, FNP  ramipril (ALTACE) 10 MG capsule Take 1 capsule (10 mg total) by mouth daily. 09/04/12   Einar Pheasant, MD  rosuvastatin (CRESTOR) 40 MG tablet Take 1 tablet (40 mg total) by mouth daily. 06/12/18   Einar Pheasant, MD  traZODone (DESYREL) 50 MG tablet Take 0.5-1 tablets (25-50 mg total) by mouth at bedtime as needed for sleep. 10/08/18   Einar Pheasant, MD    Allergies Ciprofloxacin; Levaquin [levofloxacin]; and Tequin [gatifloxacin]    Social History Social History   Tobacco Use  . Smoking status: Former Smoker    Types: Cigarettes    Last attempt to quit: 11/19/1989    Years since quitting: 29.0  . Smokeless tobacco: Never Used  Substance Use Topics  . Alcohol use: No    Alcohol/week: 0.0 standard drinks  . Drug use: No    Review of Systems Patient denies headaches, rhinorrhea, blurry vision, numbness, shortness of breath, chest pain, edema, cough, abdominal pain, nausea, vomiting, diarrhea, dysuria, fevers, rashes or hallucinations unless otherwise stated above in HPI. ____________________________________________   PHYSICAL EXAM:  VITAL SIGNS: Vitals:   12/01/18 1628 12/01/18 1753  BP: (!) 112/53 (!) 156/82  Pulse: 63 60  Resp: 17 20  Temp: 98.2 F (36.8 C)   SpO2: 96% 98%    Constitutional: Alert and oriented.  Eyes: Conjunctivae are normal.  Head: Atraumatic. Nose: No congestion/rhinnorhea. Mouth/Throat: Mucous membranes are moist.   Neck: No stridor. Painless ROM.  Cardiovascular: Normal rate, regular rhythm. Grossly normal heart sounds.  Good peripheral circulation. Respiratory: Normal respiratory effort.  No retractions. Lungs CTAB. Gastrointestinal: Soft and nontender. No distention. No abdominal bruits. No CVA tenderness. Genitourinary:  Musculoskeletal: No lower extremity tenderness nor edema.  No joint effusions. Neurologic:  CN- intact.  No facial droop, Normal FNF.  Normal heel to shin.  Sensation intact  bilaterally. Normal speech and language. No gross focal neurologic deficits are appreciated. No gait instability. Skin:  Skin is warm, dry and intact. No rash noted. Psychiatric: Mood and affect are normal. Speech and behavior are normal.  ____________________________________________   LABS (all labs ordered are listed, but only abnormal results are displayed)  No results found for this or any previous visit (from the past 24 hour(s)). ____________________________________________ ____________________________________________  ZJIRCVELF  I personally reviewed all radiographic images ordered to evaluate for the above acute complaints and reviewed radiology reports and findings.  These findings were personally discussed with the patient.  Please see medical record for radiology report.  ____________________________________________   PROCEDURES  Procedure(s)  performed:  Procedures    Critical Care performed: no ____________________________________________   INITIAL IMPRESSION / ASSESSMENT AND PLAN / ED COURSE  Pertinent labs & imaging results that were available during my care of the patient were reviewed by me and considered in my medical decision making (see chart for details).   DDX: tension, mass, sah, iph, neuralgia, gca, mastoiditis, aom  RAYSHAWN VISCONTI is a 85 y.o. who presents to the ED with headache as described above.  Patient is AFVSS in ED. Exam as above. Given current presentation have considered the above differential.  Neuro exam is non focal and without any deficits.  No bruits on neck auscultation.  Ct head shows no aica.  Not consistent with stroke.  temporal arteries nontender no evidence of mastoiditis or AOM.  Patient pain-free.  At this point do believe she stable and appropriate for referral to neurology as an outpatient..        As part of my medical decision making, I reviewed the following data within the [place] notes  reviewed and incorporated, Labs reviewed, notes from prior ED visits.   ____________________________________________   FINAL CLINICAL IMPRESSION(S) / ED DIAGNOSES  Final diagnoses:  Bad headache      NEW MEDICATIONS STARTED DURING THIS VISIT:  New Prescriptions   No medications on file     Note:  This document was prepared using Dragon voice recognition software and may include unintentional dictation errors.    Merlyn Lot, MD 12/01/18 [phone]

## 2018-12-01 NOTE — Telephone Encounter (Signed)
Incoming call from Patient with complaint of a headache on the top of her head radiating down behind her left ear.  To  Her left side of neck.   Pain was rated an 8.  Patient was requesting an appointment with Dr.  Nicki Reaper.  Onset was about a week ago.  Patient states When it occurs that she can not move her head at all,has to put a warm compress on it. Patient  States that pain is constant unless she takes a tylenol  but it comes right back. Rates it moderate to severe.  This is  recurrent  Pain.   Denies Head injury.  occasional eye pain.   Reviewed  Protocol with Patient .  Protocol recommended that Patient  Be evaluate at ED or Urgent Care.  Patient state that she would go to Urgent Care.  Reviewed Care Advice.  Voiced understanding.  Patient states she will wait for her daughter to return home shortly ant will have her daughter  Take her to Urgent care.  Will inform Dr. Nicki Reaper.   Reason for Disposition . [1] MODERATE headache (e.g., interferes with normal activities) AND [2] present > 24 hours AND [3] unexplained  (Exceptions: analgesics not tried, typical migraine, or headache part of viral illness)  Answer Assessment - Initial Assessment Questions 1. LOCATION: "Where does it hurt?"      *No Answer* 2. ONSET: "When did the headache start?" (Minutes, hours or days)      A week ago.  3. PATTERN: "Does the pain come and go, or has it been constant since it started?"     Constant, mostly  Unless I take a tylenol  relieves then comes back 4. SEVERITY: "How bad is the pain?" and "What does it keep you from doing?"  (e.g., Scale 1-10; mild, moderate, or severe)   - MILD (1-3): doesn't interfere with normal activities    - MODERATE (4-7): interferes with normal activities or awakens from sleep    - SEVERE (8-10): excruciating pain, unable to do any normal activities        Moderate  5. RECURRENT SYMPTOM: "Have you ever had headaches before?" If so, ask: "When was the last time?" and "What happened that  time?"      yes 6. CAUSE: "What do you think is causing the headache?"     Unknown caroddid surgery 7. MIGRAINE: "Have you been diagnosed with migraine headaches?" If so, ask: "Is this headache similar?"      Never had a migraine  headed 8. HEAD INJURY: "Has there been any recent injury to the head?"      denies 9. OTHER SYMPTOMS: "Do you have any other symptoms?" (fever, stiff neck, eye pain, sore throat, cold symptoms)     denies 10. PREGNANCY: "Is there any chance you are pregnant?" "When was your last menstrual period?"        na  Protocols used: HEADACHE-A-AH

## 2018-12-01 NOTE — Telephone Encounter (Signed)
Ms. Seeney recently had an ultrasound in 07/2018 with showed her surgical site was doing well. It is unlikely that something has greatly changed in 4 months.  Her surgery was approximately 5 years ago.  Many people have pain and headache in the months immediately following the surgery, not 5 years later.  We would be happy to bring her in for an ultrasound, however there is the strong possibility that the location of the pain is coincidental and we may not find a cause that we can treat as a vascular surgery practice.    Also, has she contacted her PCP regarding this? Her PCP may be able to run different tests and determine whether this is a possible vascular issue or something totally unrelated.

## 2018-12-01 NOTE — Telephone Encounter (Signed)
Called and spoke to patient and relayed to her what Arna Medici, NP stated. She said the pain is not from the surgery 5 years ago but from the surgery in 2004. I again reiterated to her what was stated that the pain is normally months after not multiple years. Advised her to reach out to her primary care because here only vascular issues are treated. I told her if she would still like to schedule here she can but she stated she would contact her primary care and move forward with her recommendations. No other questions or concerns.

## 2018-12-01 NOTE — Discharge Instructions (Addendum)

## 2018-12-02 NOTE — Telephone Encounter (Signed)
Pt was evaluated in ED yesterday

## 2018-12-02 NOTE — Telephone Encounter (Signed)
Please confirm pt doing ok and if f/u with neurology was arranged.

## 2018-12-03 ENCOUNTER — Inpatient Hospital Stay (HOSPITAL_BASED_OUTPATIENT_CLINIC_OR_DEPARTMENT_OTHER): Payer: Medicare Other | Admitting: Internal Medicine

## 2018-12-03 ENCOUNTER — Other Ambulatory Visit: Payer: Self-pay

## 2018-12-03 ENCOUNTER — Encounter: Payer: Self-pay | Admitting: Internal Medicine

## 2018-12-03 ENCOUNTER — Other Ambulatory Visit: Payer: Self-pay | Admitting: *Deleted

## 2018-12-03 ENCOUNTER — Inpatient Hospital Stay: Payer: Medicare Other

## 2018-12-03 ENCOUNTER — Inpatient Hospital Stay: Payer: Medicare Other | Attending: Internal Medicine

## 2018-12-03 VITALS — BP 160/78 | HR 80 | Resp 18

## 2018-12-03 DIAGNOSIS — I129 Hypertensive chronic kidney disease with stage 1 through stage 4 chronic kidney disease, or unspecified chronic kidney disease: Secondary | ICD-10-CM | POA: Diagnosis not present

## 2018-12-03 DIAGNOSIS — N184 Chronic kidney disease, stage 4 (severe): Secondary | ICD-10-CM

## 2018-12-03 DIAGNOSIS — D500 Iron deficiency anemia secondary to blood loss (chronic): Secondary | ICD-10-CM

## 2018-12-03 DIAGNOSIS — D508 Other iron deficiency anemias: Secondary | ICD-10-CM

## 2018-12-03 DIAGNOSIS — D509 Iron deficiency anemia, unspecified: Secondary | ICD-10-CM

## 2018-12-03 DIAGNOSIS — K922 Gastrointestinal hemorrhage, unspecified: Secondary | ICD-10-CM | POA: Insufficient documentation

## 2018-12-03 DIAGNOSIS — D649 Anemia, unspecified: Secondary | ICD-10-CM

## 2018-12-03 LAB — IRON AND TIBC
Iron: 41 ug/dL (ref 28–170)
Saturation Ratios: 12 % (ref 10.4–31.8)
TIBC: 358 ug/dL (ref 250–450)
UIBC: 317 ug/dL

## 2018-12-03 LAB — BASIC METABOLIC PANEL
ANION GAP: 8 (ref 5–15)
BUN: 25 mg/dL — ABNORMAL HIGH (ref 8–23)
CALCIUM: 9.2 mg/dL (ref 8.9–10.3)
CO2: 25 mmol/L (ref 22–32)
Chloride: 105 mmol/L (ref 98–111)
Creatinine, Ser: 1.63 mg/dL — ABNORMAL HIGH (ref 0.44–1.00)
GFR calc Af Amer: 33 mL/min — ABNORMAL LOW (ref >60)
GFR, EST NON AFRICAN AMERICAN: 29 mL/min — AB (ref >60)
Glucose, Bld: 261 mg/dL — ABNORMAL HIGH (ref 70–99)
Potassium: 4.2 mmol/L (ref 3.5–5.1)
Sodium: 138 mmol/L (ref 135–145)

## 2018-12-03 LAB — CBC WITH DIFFERENTIAL/PLATELET
Abs Immature Granulocytes: 0.02 10*3/uL (ref 0.00–0.07)
Basophils Absolute: 0.1 10*3/uL (ref 0.0–0.1)
Basophils Relative: 1 %
EOS ABS: 0.2 10*3/uL (ref 0.0–0.5)
Eosinophils Relative: 3 %
HCT: 28.4 % — ABNORMAL LOW (ref 36.0–46.0)
Hemoglobin: 8.9 g/dL — ABNORMAL LOW (ref 12.0–15.0)
Immature Granulocytes: 0 %
Lymphocytes Relative: 29 %
Lymphs Abs: 2.1 10*3/uL (ref 0.7–4.0)
MCH: 30.8 pg (ref 26.0–34.0)
MCHC: 31.3 g/dL (ref 30.0–36.0)
MCV: 98.3 fL (ref 80.0–100.0)
Monocytes Absolute: 0.4 10*3/uL (ref 0.1–1.0)
Monocytes Relative: 5 %
Neutro Abs: 4.5 10*3/uL (ref 1.7–7.7)
Neutrophils Relative %: 62 %
Platelets: 236 10*3/uL (ref 150–400)
RBC: 2.89 MIL/uL — ABNORMAL LOW (ref 3.87–5.11)
RDW: 15.3 % (ref 11.5–15.5)
WBC: 7.3 10*3/uL (ref 4.0–10.5)
nRBC: 0 % (ref 0.0–0.2)

## 2018-12-03 LAB — FERRITIN: Ferritin: 22 ng/mL (ref 11–307)

## 2018-12-03 MED ORDER — SODIUM CHLORIDE 0.9 % IV SOLN
510.0000 mg | Freq: Once | INTRAVENOUS | Status: AC
  Administered 2018-12-03: 510 mg via INTRAVENOUS
  Filled 2018-12-03: qty 17

## 2018-12-03 MED ORDER — SODIUM CHLORIDE 0.9 % IV SOLN
Freq: Once | INTRAVENOUS | Status: AC
  Administered 2018-12-03: 14:00:00 via INTRAVENOUS
  Filled 2018-12-03: qty 250

## 2018-12-03 NOTE — Progress Notes (Signed)
[place] OFFICE PROGRESS NOTE  Patient Care Team: Einar Pheasant, MD as PCP - General (Internal Medicine) Einar Pheasant, MD (Internal Medicine) Leia Alf, MD (Inactive) as Referring Physician (Internal Medicine) Isaias Cowman, MD (Internal Medicine) Albesa Seen, MD (Unknown Physician Specialty) Philis Kendall, MD (Unknown Physician Specialty) Brendolyn Patty, MD (Specialist) Schnier, Dolores Lory, MD (Vascular Surgery) Leia Alf, MD (Inactive) as Referring Physician (Internal Medicine) Isaias Cowman, MD (Internal Medicine) Albesa Seen, MD (Unknown Physician Specialty) Philis Kendall, MD (Unknown Physician Specialty) Brendolyn Patty, MD (Specialist) Schnier, Dolores Lory, MD (Vascular Surgery)   SUMMARY OF HEMATOLOGIC HISTORY:  # IRON DEFICIENCY ANEMIA- recurrent [? GI blood loss; Dr.Elliot; AVM-capsule]; IV Ferrahem q 14m; last colo- 2012/march; EGD- [id number].   # CKD stage III-IV-  INTERVAL HISTORY:   85 -year-old female patient with above history of recurrent iron deficiency anemia unclear etiology is here for follow-up  Patient was recently evaluated by GI-felt high risk for endoscopic procedures given her age.  She recommended PPI sucralfate.  Patient's energy levels have improved since received IV iron few months ago.  However continues to have shortness of breath on exertion.  No blood in stools.   Review of Systems  Constitutional: Positive for malaise/fatigue. Negative for chills, diaphoresis, fever and weight loss.  HENT: Negative for nosebleeds and sore throat.   Eyes: Negative for double vision.  Respiratory: Positive for shortness of breath. Negative for cough, hemoptysis, sputum production and wheezing.   Cardiovascular: Negative for chest pain, palpitations, orthopnea and leg swelling.  Gastrointestinal: Negative for abdominal pain, blood in stool, constipation, diarrhea, heartburn, melena, nausea and vomiting.   Genitourinary: Negative for dysuria, frequency and urgency.  Musculoskeletal: Negative for back pain and joint pain.  Skin: Negative.  Negative for itching and rash.  Neurological: Negative for dizziness, tingling, focal weakness, weakness and headaches.  Endo/Heme/Allergies: Does not bruise/bleed easily.  Psychiatric/Behavioral: Negative for depression. The patient is not nervous/anxious and does not have insomnia.      PAST MEDICAL HISTORY :  Past Medical History:  Diagnosis Date  . Arthritis   . AV malformation of gastrointestinal tract   . Blood in stool   . CAD (coronary artery disease)   . Carotid arterial disease ([place])   . Cataracts, bilateral   . Chronic blood loss anemia   . Chronic cystitis   . Depression   . Diabetes mellitus ([place])   . GERD (gastroesophageal reflux disease)   . Hyperlipidemia   . Hypertension   . IDA (iron deficiency anemia)   . Stress incontinence     PAST SURGICAL HISTORY :   Past Surgical History:  Procedure Laterality Date  . ABDOMINAL HYSTERECTOMY  1972   ovaries left in place  . APPENDECTOMY  1992  . BACK SURGERY  06/08/2010   L3, L4, L5  . CAROTID ARTERY ANGIOPLASTY  11/03  . [place], 92   right then left   . CHOLECYSTECTOMY  1993  . FOOT SURGERY  06/2007   Left  . right carotid artery surgery  01/09/13   Dr. Delana Meyer @ [facility]  . TOTAL HIP ARTHROPLASTY  05/03/2011   left 12, right 11/07    FAMILY HISTORY :   Family History  Problem Relation Age of Onset  . Hyperlipidemia Father   . Heart disease Father        myocardial infarction  . Hypertension Father        Parent  . Arthritis Other  parent  . Diabetes Other        nephew  . Cervical cancer Sister   . Rectal cancer Sister   . Breast cancer Neg Hx     SOCIAL HISTORY:   Social History   Tobacco Use  . Smoking status: Former Smoker    Types: Cigarettes    Last attempt to quit: 11/19/1989    Years since quitting: 29.0  . Smokeless  tobacco: Never Used  Substance Use Topics  . Alcohol use: No    Alcohol/week: 0.0 standard drinks  . Drug use: No    ALLERGIES:  is allergic to ciprofloxacin; levaquin [levofloxacin]; and tequin [gatifloxacin].  MEDICATIONS:  Current Outpatient Medications  Medication Sig Dispense Refill  . aspirin 81 MG tablet Take 81 mg by mouth daily.    . Biotin 5000 MCG CAPS Take 1 capsule by mouth daily.    . cefdinir (OMNICEF) 300 MG capsule Take 1 capsule (300 mg total) by mouth 2 (two) times daily. 10 capsule 0  . cetirizine (ZYRTEC) 10 MG tablet Take 10 mg by mouth daily.    . metoprolol succinate (TOPROL-XL) 100 MG 24 hr tablet TAKE 1 TABLET TWICE A DAY 180 tablet 4  . Omega-3 Fatty Acids (FISH OIL) 1200 MG CAPS Take 1 capsule by mouth daily.    Marland Kitchen omeprazole (PRILOSEC) 20 MG capsule Take 20 mg by mouth daily.    . ondansetron (ZOFRAN) 4 MG tablet Take 1 tablet (4 mg total) by mouth every 8 (eight) hours as needed for nausea or vomiting. 20 tablet 0  . ramipril (ALTACE) 10 MG capsule Take 1 capsule (10 mg total) by mouth daily. 90 capsule 3  . rosuvastatin (CRESTOR) 40 MG tablet Take 1 tablet (40 mg total) by mouth daily. 90 tablet 3  . traZODone (DESYREL) 50 MG tablet Take 0.5-1 tablets (25-50 mg total) by mouth at bedtime as needed for sleep. 90 tablet 0   No current facility-administered medications for this visit.     PHYSICAL EXAMINATION:   BP 120/69 (BP Location: Left Arm, Patient Position: Sitting, Cuff Size: Normal)   Pulse 76   Resp 16   Wt 139 lb 3.2 oz (63.1 kg)   BMI 28.11 kg/m   Filed Weights   12/03/18 1317  Weight: 139 lb 3.2 oz (63.1 kg)    Physical Exam  Constitutional: She is oriented to person, place, and time and well-developed, well-nourished, and in no distress.  HENT:  Head: Normocephalic and atraumatic.  Mouth/Throat: Oropharynx is clear and moist. No oropharyngeal exudate.  Eyes: Pupils are equal, round, and reactive to light.  Positive for pallor.   Neck: Normal range of motion. Neck supple.  Cardiovascular: Normal rate and regular rhythm.  Pulmonary/Chest: No respiratory distress. She has no wheezes.  Abdominal: Soft. Bowel sounds are normal. She exhibits no distension and no mass. There is no abdominal tenderness. There is no rebound and no guarding.  Musculoskeletal: Normal range of motion.        General: No tenderness or edema.  Neurological: She is alert and oriented to person, place, and time.  Skin: Skin is warm.  Psychiatric: Affect normal.    LABORATORY DATA:  I have reviewed the data as listed    Component Value Date/Time   NA 138 12/03/2018 1300   NA 143 01/10/2013 0314   K 4.2 12/03/2018 1300   K 4.0 01/10/2013 0314   CL 105 12/03/2018 1300   CL 109 (H) 01/10/2013 0314   CO2  25 12/03/2018 1300   CO2 27 01/10/2013 0314   GLUCOSE 261 (H) 12/03/2018 1300   GLUCOSE 119 (H) 01/10/2013 0314   BUN 25 (H) 12/03/2018 1300   BUN 15 01/10/2013 0314   CREATININE 1.63 (H) 12/03/2018 1300   CREATININE 1.03 01/10/2013 0314   CREATININE 1.13 (H) 09/18/2012 0848   CALCIUM 9.2 12/03/2018 1300   CALCIUM 8.1 (L) 01/10/2013 0314   PROT 6.6 10/13/2018 0950   PROT 7.2 12/20/2011 1521   ALBUMIN 3.8 10/13/2018 0950   ALBUMIN 3.9 12/20/2011 1521   AST 24 10/13/2018 0950   AST 31 12/20/2011 1521   ALT 21 10/13/2018 0950   ALT 37 12/20/2011 1521   ALKPHOS 50 10/13/2018 0950   ALKPHOS 57 12/20/2011 1521   BILITOT 0.4 10/13/2018 0950   BILITOT 0.5 12/20/2011 1521   GFRNONAA 29 (L) 12/03/2018 1300   GFRNONAA 52 (L) 01/10/2013 0314   GFRNONAA 47 (L) 09/18/2012 0848   GFRAA 33 (L) 12/03/2018 1300   GFRAA >60 01/10/2013 0314   GFRAA 54 (L) 09/18/2012 0848    No results found for: SPEP, UPEP  Lab Results  Component Value Date   WBC 7.3 12/03/2018   NEUTROABS 4.5 12/03/2018   HGB 8.9 (L) 12/03/2018   HCT 28.4 (L) 12/03/2018   MCV 98.3 12/03/2018   PLT 236 12/03/2018      Chemistry      Component Value Date/Time   NA  138 12/03/2018 1300   NA 143 01/10/2013 0314   K 4.2 12/03/2018 1300   K 4.0 01/10/2013 0314   CL 105 12/03/2018 1300   CL 109 (H) 01/10/2013 0314   CO2 25 12/03/2018 1300   CO2 27 01/10/2013 0314   BUN 25 (H) 12/03/2018 1300   BUN 15 01/10/2013 0314   CREATININE 1.63 (H) 12/03/2018 1300   CREATININE 1.03 01/10/2013 0314   CREATININE 1.13 (H) 09/18/2012 0848      Component Value Date/Time   CALCIUM 9.2 12/03/2018 1300   CALCIUM 8.1 (L) 01/10/2013 0314   ALKPHOS 50 10/13/2018 0950   ALKPHOS 57 12/20/2011 1521   AST 24 10/13/2018 0950   AST 31 12/20/2011 1521   ALT 21 10/13/2018 0950   ALT 37 12/20/2011 1521   BILITOT 0.4 10/13/2018 0950   BILITOT 0.5 12/20/2011 1521         ASSESSMENT & PLAN:   Iron deficiency anemia due to chronic blood loss # Recurrent iron deficiency anemia/question AVM-CKD: Patient quite symptomatic.  Status post IV Feraheme November 2019-today Jan 2020-hemoglobin is 8.9.  Proceed with IV Feraheme today.  #Discussed the role of Aranesp in conjunction with IV iron to help improve her hemoglobin.  Discussed that would not recommend Aranesp with hemoglobin is above 10/also discussed the small risk of strokes and heart attacks with elevated hematocrit.  # CKD-stage III/stage IV-creatinine 1.6/ultrasound negative for any obstruction.  Defer to PCP  # DISPOSITION: add iron studies/ferritin today # Ferrahem today. # Follow up in 4 months with MD/labs-cbc/bmp-possible ferrahme- Dr.B  Cc; Dr.Scott.      Cammie Sickle, MD 12/03/2018 1:41 PM

## 2018-12-03 NOTE — Telephone Encounter (Signed)
Noted.  Did she need earlier f/u with me.   I also sent you a message on this pt regarding her labs from Dr Rogue Bussing.

## 2018-12-03 NOTE — Telephone Encounter (Signed)
LMTCB

## 2018-12-03 NOTE — Telephone Encounter (Signed)
Patient is doing ok, she got another iron infusion today at the cancer center but she does not have a follow up with neurology. She says they saw some fluid behind her ear drums and she should call Dr. Tami Ribas if her ears start bothering her

## 2018-12-03 NOTE — Assessment & Plan Note (Addendum)
#   Recurrent iron deficiency anemia/question AVM-CKD: Patient quite symptomatic.  Status post IV Feraheme November 2019-today Jan 2020-hemoglobin is 8.9.  Proceed with IV Feraheme today.  #Discussed the role of Aranesp in conjunction with IV iron to help improve her hemoglobin.  Discussed that would not recommend Aranesp with hemoglobin is above 10/also discussed the small risk of strokes and heart attacks with elevated hematocrit.  # CKD-stage III/stage IV-creatinine 1.6/ultrasound negative for any obstruction.  Defer to PCP  # DISPOSITION: add iron studies/ferritin today # Ferrahem today. # Follow up in 4 months with MD/labs-cbc/bmp-possible ferrahme- Dr.B  Cc; Dr.Scott.

## 2018-12-05 LAB — SAMPLE TO BLOOD BANK

## 2019-01-02 ENCOUNTER — Other Ambulatory Visit: Payer: Self-pay | Admitting: Internal Medicine

## 2019-01-07 ENCOUNTER — Other Ambulatory Visit: Payer: Self-pay | Admitting: Internal Medicine

## 2019-01-07 DIAGNOSIS — Z1231 Encounter for screening mammogram for malignant neoplasm of breast: Secondary | ICD-10-CM

## 2019-02-02 ENCOUNTER — Encounter: Payer: Self-pay | Admitting: Pulmonary Disease

## 2019-02-02 ENCOUNTER — Encounter
Admission: RE | Disposition: A | Discharge status: Home Health | Payer: Self-pay | Source: Home / Self Care | Attending: Internal Medicine

## 2019-02-02 ENCOUNTER — Ambulatory Visit: Payer: Medicare Other | Admitting: Certified Registered Nurse Anesthetist

## 2019-02-02 ENCOUNTER — Inpatient Hospital Stay
Admission: RE | Admit: 2019-02-02 | Discharge: 2019-02-04 | DRG: 309 | Disposition: A | Discharge status: Home Health | Payer: Medicare Other | Attending: Internal Medicine | Admitting: Internal Medicine

## 2019-02-02 ENCOUNTER — Other Ambulatory Visit: Payer: Self-pay

## 2019-02-02 ENCOUNTER — Encounter: Payer: Self-pay | Admitting: Certified Registered Nurse Anesthetist

## 2019-02-02 DIAGNOSIS — Z7984 Long term (current) use of oral hypoglycemic drugs: Secondary | ICD-10-CM

## 2019-02-02 DIAGNOSIS — Z7982 Long term (current) use of aspirin: Secondary | ICD-10-CM | POA: Diagnosis not present

## 2019-02-02 DIAGNOSIS — Z96643 Presence of artificial hip joint, bilateral: Secondary | ICD-10-CM | POA: Diagnosis present

## 2019-02-02 DIAGNOSIS — Z8349 Family history of other endocrine, nutritional and metabolic diseases: Secondary | ICD-10-CM

## 2019-02-02 DIAGNOSIS — D509 Iron deficiency anemia, unspecified: Secondary | ICD-10-CM | POA: Diagnosis present

## 2019-02-02 DIAGNOSIS — Z9049 Acquired absence of other specified parts of digestive tract: Secondary | ICD-10-CM

## 2019-02-02 DIAGNOSIS — I4891 Unspecified atrial fibrillation: Secondary | ICD-10-CM | POA: Diagnosis present

## 2019-02-02 DIAGNOSIS — I2510 Atherosclerotic heart disease of native coronary artery without angina pectoris: Secondary | ICD-10-CM | POA: Diagnosis present

## 2019-02-02 DIAGNOSIS — I959 Hypotension, unspecified: Secondary | ICD-10-CM | POA: Diagnosis present

## 2019-02-02 DIAGNOSIS — I480 Paroxysmal atrial fibrillation: Secondary | ICD-10-CM | POA: Diagnosis present

## 2019-02-02 DIAGNOSIS — N184 Chronic kidney disease, stage 4 (severe): Secondary | ICD-10-CM | POA: Diagnosis present

## 2019-02-02 DIAGNOSIS — Z87891 Personal history of nicotine dependence: Secondary | ICD-10-CM

## 2019-02-02 DIAGNOSIS — N179 Acute kidney failure, unspecified: Secondary | ICD-10-CM | POA: Diagnosis present

## 2019-02-02 DIAGNOSIS — N3020 Other chronic cystitis without hematuria: Secondary | ICD-10-CM | POA: Diagnosis present

## 2019-02-02 DIAGNOSIS — T447X5A Adverse effect of beta-adrenoreceptor antagonists, initial encounter: Secondary | ICD-10-CM | POA: Diagnosis present

## 2019-02-02 DIAGNOSIS — Z881 Allergy status to other antibiotic agents status: Secondary | ICD-10-CM

## 2019-02-02 DIAGNOSIS — E860 Dehydration: Secondary | ICD-10-CM | POA: Diagnosis present

## 2019-02-02 DIAGNOSIS — I129 Hypertensive chronic kidney disease with stage 1 through stage 4 chronic kidney disease, or unspecified chronic kidney disease: Secondary | ICD-10-CM | POA: Diagnosis present

## 2019-02-02 DIAGNOSIS — E785 Hyperlipidemia, unspecified: Secondary | ICD-10-CM | POA: Diagnosis present

## 2019-02-02 DIAGNOSIS — Z90710 Acquired absence of both cervix and uterus: Secondary | ICD-10-CM

## 2019-02-02 DIAGNOSIS — K219 Gastro-esophageal reflux disease without esophagitis: Secondary | ICD-10-CM | POA: Diagnosis present

## 2019-02-02 DIAGNOSIS — Z8249 Family history of ischemic heart disease and other diseases of the circulatory system: Secondary | ICD-10-CM

## 2019-02-02 DIAGNOSIS — Z833 Family history of diabetes mellitus: Secondary | ICD-10-CM

## 2019-02-02 DIAGNOSIS — Z79899 Other long term (current) drug therapy: Secondary | ICD-10-CM

## 2019-02-02 DIAGNOSIS — E1122 Type 2 diabetes mellitus with diabetic chronic kidney disease: Secondary | ICD-10-CM | POA: Diagnosis present

## 2019-02-02 DIAGNOSIS — I442 Atrioventricular block, complete: Principal | ICD-10-CM | POA: Diagnosis present

## 2019-02-02 DIAGNOSIS — J9600 Acute respiratory failure, unspecified whether with hypoxia or hypercapnia: Secondary | ICD-10-CM

## 2019-02-02 DIAGNOSIS — R001 Bradycardia, unspecified: Secondary | ICD-10-CM | POA: Diagnosis present

## 2019-02-02 DIAGNOSIS — N393 Stress incontinence (female) (male): Secondary | ICD-10-CM | POA: Diagnosis present

## 2019-02-02 DIAGNOSIS — R9431 Abnormal electrocardiogram [ECG] [EKG]: Secondary | ICD-10-CM

## 2019-02-02 HISTORY — PX: COLONOSCOPY WITH PROPOFOL: SHX5780

## 2019-02-02 HISTORY — PX: ESOPHAGOGASTRODUODENOSCOPY: SHX5428

## 2019-02-02 LAB — COMPREHENSIVE METABOLIC PANEL
ALT: 24 U/L (ref 0–44)
AST: 42 U/L — ABNORMAL HIGH (ref 15–41)
Albumin: 3.1 g/dL — ABNORMAL LOW (ref 3.5–5.0)
Alkaline Phosphatase: 46 U/L (ref 38–126)
Anion gap: 10 (ref 5–15)
BUN: 16 mg/dL (ref 8–23)
CO2: 20 mmol/L — ABNORMAL LOW (ref 22–32)
Calcium: 8.7 mg/dL — ABNORMAL LOW (ref 8.9–10.3)
Chloride: 111 mmol/L (ref 98–111)
Creatinine, Ser: 1.79 mg/dL — ABNORMAL HIGH (ref 0.44–1.00)
GFR calc non Af Amer: 26 mL/min — ABNORMAL LOW (ref >60)
GFR, EST AFRICAN AMERICAN: 30 mL/min — AB (ref >60)
Glucose, Bld: 158 mg/dL — ABNORMAL HIGH (ref 70–99)
Potassium: 4.1 mmol/L (ref 3.5–5.1)
Sodium: 141 mmol/L (ref 135–145)
Total Bilirubin: 0.4 mg/dL (ref 0.3–1.2)
Total Protein: 5.7 g/dL — ABNORMAL LOW (ref 6.5–8.1)

## 2019-02-02 LAB — CBC
HCT: 24.2 % — ABNORMAL LOW (ref 36.0–46.0)
Hemoglobin: 7.5 g/dL — ABNORMAL LOW (ref 12.0–15.0)
MCH: 30.1 pg (ref 26.0–34.0)
MCHC: 31 g/dL (ref 30.0–36.0)
MCV: 97.2 fL (ref 80.0–100.0)
Platelets: 191 10*3/uL (ref 150–400)
RBC: 2.49 MIL/uL — ABNORMAL LOW (ref 3.87–5.11)
RDW: 13.9 % (ref 11.5–15.5)
WBC: 7.1 10*3/uL (ref 4.0–10.5)
nRBC: 0 % (ref 0.0–0.2)

## 2019-02-02 LAB — PREPARE RBC (CROSSMATCH)

## 2019-02-02 LAB — ABO/RH: ABO/RH(D): A POS

## 2019-02-02 LAB — GLUCOSE, CAPILLARY
Glucose-Capillary: 130 mg/dL — ABNORMAL HIGH (ref 70–99)
Glucose-Capillary: 124 mg/dL — ABNORMAL HIGH (ref 70–99)

## 2019-02-02 LAB — HEMOGLOBIN AND HEMATOCRIT, BLOOD
HCT: 24.7 % — ABNORMAL LOW (ref 36.0–46.0)
Hemoglobin: 7.5 g/dL — ABNORMAL LOW (ref 12.0–15.0)

## 2019-02-02 LAB — CKMB (ARMC ONLY): CK, MB: 17 ng/mL — ABNORMAL HIGH (ref 0.5–5.0)

## 2019-02-02 LAB — TROPONIN I: Troponin I: 0.67 ng/mL (ref <0.03)

## 2019-02-02 SURGERY — EGD (ESOPHAGOGASTRODUODENOSCOPY)
Anesthesia: General

## 2019-02-02 MED ORDER — VASOPRESSIN 20 UNIT/ML IV SOLN
INTRAVENOUS | Status: DC | PRN
  Administered 2019-02-02: 1 [IU] via INTRAVENOUS
  Administered 2019-02-02: 2 [IU] via INTRAVENOUS
  Administered 2019-02-02 (×4): 1 [IU] via INTRAVENOUS

## 2019-02-02 MED ORDER — IPRATROPIUM-ALBUTEROL 0.5-2.5 (3) MG/3ML IN SOLN
3.0000 mL | RESPIRATORY_TRACT | Status: DC | PRN
  Administered 2019-02-02 – 2019-02-03 (×2): 3 mL via RESPIRATORY_TRACT
  Filled 2019-02-02 (×2): qty 3

## 2019-02-02 MED ORDER — BIOTIN 5000 MCG PO CAPS: 1.0000 | ORAL_CAPSULE | Freq: Every day | ORAL | Status: DC

## 2019-02-02 MED ORDER — SODIUM CHLORIDE 0.9 % IV SOLN
INTRAVENOUS | Status: DC
  Administered 2019-02-02: 1000 mL via INTRAVENOUS

## 2019-02-02 MED ORDER — PROPOFOL 10 MG/ML IV BOLUS
INTRAVENOUS | Status: DC | PRN
  Administered 2019-02-02: 80 mg via INTRAVENOUS

## 2019-02-02 MED ORDER — GLYCOPYRROLATE 0.2 MG/ML IJ SOLN
INTRAMUSCULAR | Status: DC | PRN
  Administered 2019-02-02: 0.2 mg via INTRAVENOUS

## 2019-02-02 MED ORDER — ONDANSETRON HCL 4 MG PO TABS: 4.0000 mg | ORAL_TABLET | Freq: Four times a day (QID) | ORAL | Status: DC | PRN

## 2019-02-02 MED ORDER — ROSUVASTATIN CALCIUM 10 MG PO TABS
40.0000 mg | ORAL_TABLET | Freq: Every day | ORAL | Status: DC
  Administered 2019-02-02: 40 mg via ORAL
  Filled 2019-02-02 (×2): qty 4

## 2019-02-02 MED ORDER — ACETAMINOPHEN 325 MG PO TABS: 650.0000 mg | ORAL_TABLET | Freq: Four times a day (QID) | ORAL | Status: DC | PRN

## 2019-02-02 MED ORDER — SODIUM CHLORIDE 0.9 % IV SOLN
INTRAVENOUS | Status: DC
  Administered 2019-02-02: via INTRAVENOUS

## 2019-02-02 MED ORDER — SODIUM CHLORIDE 0.9 % IV SOLN: INTRAVENOUS | Status: DC

## 2019-02-02 MED ORDER — PIPERACILLIN-TAZOBACTAM 3.375 G IVPB
INTRAVENOUS | Status: AC
  Filled 2019-02-02: qty 50

## 2019-02-02 MED ORDER — IPRATROPIUM-ALBUTEROL 0.5-2.5 (3) MG/3ML IN SOLN
RESPIRATORY_TRACT | Status: AC
  Administered 2019-02-02: 3 mL
  Filled 2019-02-02: qty 3

## 2019-02-02 MED ORDER — PROPOFOL 500 MG/50ML IV EMUL
INTRAVENOUS | Status: AC
  Filled 2019-02-02: qty 50

## 2019-02-02 MED ORDER — VASOPRESSIN 20 UNIT/ML IV SOLN
INTRAVENOUS | Status: AC
  Filled 2019-02-02: qty 1

## 2019-02-02 MED ORDER — PIPERACILLIN-TAZOBACTAM 3.375 G IVPB
3.3750 g | Freq: Once | INTRAVENOUS | Status: AC
  Administered 2019-02-02: 3.375 g via INTRAVENOUS

## 2019-02-02 MED ORDER — SODIUM CHLORIDE 0.9% IV SOLUTION
Freq: Once | INTRAVENOUS | Status: AC
  Administered 2019-02-02: 17:00:00 via INTRAVENOUS

## 2019-02-02 MED ORDER — ORAL CARE MOUTH RINSE: 15.0000 mL | Freq: Two times a day (BID) | OROMUCOSAL | Status: DC

## 2019-02-02 MED ORDER — GLYCOPYRROLATE 0.2 MG/ML IJ SOLN
INTRAMUSCULAR | Status: AC
  Filled 2019-02-02: qty 1

## 2019-02-02 MED ORDER — LIDOCAINE HCL (CARDIAC) PF 100 MG/5ML IV SOSY
PREFILLED_SYRINGE | INTRAVENOUS | Status: DC | PRN
  Administered 2019-02-02: 50 mg via INTRAVENOUS

## 2019-02-02 MED ORDER — PHENYLEPHRINE HCL 10 MG/ML IJ SOLN
INTRAMUSCULAR | Status: DC | PRN
  Administered 2019-02-02 (×2): 200 ug via INTRAVENOUS

## 2019-02-02 MED ORDER — PHENYLEPHRINE HCL 10 MG/ML IJ SOLN
INTRAMUSCULAR | Status: AC
  Filled 2019-02-02: qty 1

## 2019-02-02 MED ORDER — OMEGA-3-ACID ETHYL ESTERS 1 G PO CAPS
1.0000 | ORAL_CAPSULE | Freq: Every day | ORAL | Status: DC
  Administered 2019-02-02 – 2019-02-04 (×3): 1 g via ORAL
  Filled 2019-02-02 (×3): qty 1

## 2019-02-02 MED ORDER — PANTOPRAZOLE SODIUM 40 MG PO TBEC
40.0000 mg | DELAYED_RELEASE_TABLET | Freq: Every day | ORAL | Status: DC
  Administered 2019-02-02 – 2019-02-04 (×3): 40 mg via ORAL
  Filled 2019-02-02 (×3): qty 1

## 2019-02-02 MED ORDER — ACETAMINOPHEN 650 MG RE SUPP
650.0000 mg | Freq: Four times a day (QID) | RECTAL | Status: DC | PRN
  Filled 2019-02-02: qty 1

## 2019-02-02 MED ORDER — ONDANSETRON HCL 4 MG/2ML IJ SOLN: 4.0000 mg | Freq: Four times a day (QID) | INTRAMUSCULAR | Status: DC | PRN

## 2019-02-02 MED ORDER — ALBUTEROL SULFATE (2.5 MG/3ML) 0.083% IN NEBU
2.5000 mg | INHALATION_SOLUTION | Freq: Once | RESPIRATORY_TRACT | Status: DC
  Filled 2019-02-02: qty 3

## 2019-02-02 MED ORDER — LIDOCAINE HCL (PF) 2 % IJ SOLN
INTRAMUSCULAR | Status: AC
  Filled 2019-02-02: qty 10

## 2019-02-02 NOTE — Addendum Note (Signed)
Addendum  created 02/02/19 1222 by Durenda Hurt, MD   Order list changed

## 2019-02-02 NOTE — Consult Note (Signed)
Patient went into complete heart block for several seconds and then came out of it after several seconds.  BP restored.  Procedures were aborted and patient will need to be admitted to hospital with cardiology consult.  Dr. Josefa Half is her cardiologist.

## 2019-02-02 NOTE — Anesthesia Post-op Follow-up Note (Signed)
Anesthesia QCDR form completed.        

## 2019-02-02 NOTE — Anesthesia Postprocedure Evaluation (Signed)
Anesthesia Post Note  Patient: Jocelyn Elliott  Procedure(s) Performed: ESOPHAGOGASTRODUODENOSCOPY (EGD) (N/A ) COLONOSCOPY WITH PROPOFOL (N/A )  Patient location during evaluation: PACU Anesthesia Type: General Level of consciousness: awake and alert Pain management: pain level controlled Vital Signs Assessment: vitals unstable Respiratory status: spontaneous breathing, nonlabored ventilation, respiratory function stable and patient connected to nasal cannula oxygen Postop Assessment: no apparent nausea or vomiting Comments:   After receiving initial propofol bolus, EKG showed long sinus pause associated with hypotension requiring vasopressor treatment.  Rhythm noted to be Afib.  Case canceled, 12-lead EKG obtained, cardiology and admitting hospitalist consulted, labs sent.  Pt experienced two additional sinus pauses in PACU and required several boluses of vasopressor.  Care handed off to cardiologist and hospitalist at bedside.  Pt will be admitted to telemetry.  Defib/pacing pads in place.     Last Vitals:  Vitals:   02/02/19 0932 02/02/19 0942  BP: (!) 93/55 (!) 97/53  Pulse: 70 76  Resp: 11 20  Temp:    SpO2: 100% 100%    Last Pain:  Vitals:   02/02/19 0942  TempSrc:   PainSc: 0-No pain                 Durenda Hurt

## 2019-02-02 NOTE — Progress Notes (Addendum)
Patients blood pressure 80s/40s. Notified Dr. Ola Spurr and she administered Vasopressin. Patients blood pressure responded positively. Dr. Ola Spurr also called the physician taking over her care to discuss the patient going to CCU instead of a telemetry bed. Patient now going to CCU

## 2019-02-02 NOTE — Addendum Note (Signed)
Addendum  created 02/02/19 1235 by Durenda Hurt, MD   Order list changed

## 2019-02-02 NOTE — H&P (Signed)
[facility] NAME: Jocelyn Elliott    MR#:  [id number]  DATE OF BIRTH:  1932/03/29  DATE OF ADMISSION:  02/02/2019  PRIMARY CARE PHYSICIAN: Einar Pheasant, MD   REQUESTING/REFERRING PHYSICIAN: Dr.  Laurel Dimmer COMPLAINT: Sinus pauses  No chief complaint on file.   HISTORY OF PRESENT ILLNESS:  Jocelyn Elliott  is a 85 y.o. female with a known history of depression, diabetes mellitus type 2, chronic blood loss anemia, iron deficiency anemia, has been having black stool, given to have elective endoscopy and colonoscopy but noted to have new onset atrial fibrillation with sinus pauses.  Patient had drug induction with propofol, developed 8-second pause at that time patient felt very dizzy and was unresponsive at that time as per family, registered nurse taking care of the patient. Endoscopy was canceled, patient found to have atrial fibrillation, sinus pauses, we are admitting the patient to telemetry, Dr. Ubaldo Glassing recommended hold the beta-blockers and also monitor on telemetry.  Patient hemoglobin 7.5, will transfuse her 1 unit of packed RBC, we are admitting the patient to telemetry. PAST MEDICAL HISTORY:   Past Medical History:  Diagnosis Date  . Arthritis   . AV malformation of gastrointestinal tract   . Blood in stool   . CAD (coronary artery disease)   . Carotid arterial disease ([place])   . Cataracts, bilateral   . Chronic blood loss anemia   . Chronic cystitis   . Depression   . Diabetes mellitus ([place])   . GERD (gastroesophageal reflux disease)   . Hyperlipidemia   . Hypertension   . IDA (iron deficiency anemia)   . Stress incontinence     PAST SURGICAL HISTOIRY:   Past Surgical History:  Procedure Laterality Date  . ABDOMINAL HYSTERECTOMY  1972   ovaries left in place  . APPENDECTOMY  1992  . BACK SURGERY  06/08/2010   L3, L4, L5  . CAROTID ARTERY ANGIOPLASTY  11/03  . [place], 92   right then  left   . CHOLECYSTECTOMY  1993  . FOOT SURGERY  06/2007   Left  . right carotid artery surgery  01/09/13   Dr. Delana Meyer @ [facility]  . TOTAL HIP ARTHROPLASTY  05/03/2011   left 12, right 11/07    SOCIAL HISTORY:   Social History   Tobacco Use  . Smoking status: Former Smoker    Types: Cigarettes    Last attempt to quit: 11/19/1989    Years since quitting: 29.2  . Smokeless tobacco: Never Used  Substance Use Topics  . Alcohol use: No    Alcohol/week: 0.0 standard drinks    FAMILY HISTORY:   Family History  Problem Relation Age of Onset  . Hyperlipidemia Father   . Heart disease Father        myocardial infarction  . Hypertension Father        Parent  . Arthritis Other        parent  . Diabetes Other        nephew  . Cervical cancer Sister   . Rectal cancer Sister   . Breast cancer Neg Hx     DRUG ALLERGIES:   Allergies  Allergen Reactions  . Ciprofloxacin   . Levaquin [Levofloxacin]   . Tequin [Gatifloxacin] Hives and Rash    REVIEW OF SYSTEMS:  CONSTITUTIONAL: No fever, fatigue or weakness.  Patient has poor appetite, black stools going on for long time \, scheduled to  have elective EGD, colonoscopy. EYES: No blurred or double vision.  EARS, NOSE, AND THROAT: No tinnitus or ear pain.  RESPIRATORY: No cough, shortness of breath, wheezing or hemoptysis.  CARDIOVASCULAR: No chest pain, orthopnea, edema.  GASTROINTESTINAL: No nausea, vomiting, diarrhea or abdominal pain.  GENITOURINARY: No dysuria, hematuria.  ENDOCRINE: No polyuria, nocturia,  HEMATOLOGY: No anemia, easy bruising or bleeding SKIN: No rash or lesion. MUSCULOSKELETAL: No joint pain or arthritis.   NEUROLOGIC: No tingling, numbness, weakness.  PSYCHIATRY: No anxiety or depression.   MEDICATIONS AT HOME:   Prior to Admission medications   Medication Sig Start Date End Date Taking? Authorizing Provider  aspirin 81 MG tablet Take 81 mg by mouth daily.   Yes [provider]  Biotin 5000  MCG CAPS Take 1 capsule by mouth daily.   Yes [provider]  cefdinir (OMNICEF) 300 MG capsule Take 1 capsule (300 mg total) by mouth 2 (two) times daily. 10/15/18  Yes Einar Pheasant, MD  cetirizine (ZYRTEC) 10 MG tablet Take 10 mg by mouth daily.   Yes [provider]  metFORMIN (GLUCOPHAGE) 500 MG tablet TAKE 1 TABLET DAILY WITH BREAKFAST 01/02/19  Yes Einar Pheasant, MD  metoprolol succinate (TOPROL-XL) 100 MG 24 hr tablet TAKE 1 TABLET TWICE A DAY 08/11/18  Yes Einar Pheasant, MD  Omega-3 Fatty Acids (FISH OIL) 1200 MG CAPS Take 1 capsule by mouth daily.   Yes [provider]  omeprazole (PRILOSEC) 20 MG capsule Take 20 mg by mouth daily.   Yes [provider]  ondansetron (ZOFRAN) 4 MG tablet Take 1 tablet (4 mg total) by mouth every 8 (eight) hours as needed for nausea or vomiting. 07/10/18  Yes Guse, Jacquelynn Cree, FNP  ramipril (ALTACE) 10 MG capsule Take 1 capsule (10 mg total) by mouth daily. 09/04/12  Yes Einar Pheasant, MD  rosuvastatin (CRESTOR) 40 MG tablet Take 1 tablet (40 mg total) by mouth daily. 06/12/18  Yes Einar Pheasant, MD  traZODone (DESYREL) 50 MG tablet Take 0.5-1 tablets (25-50 mg total) by mouth at bedtime as needed for sleep. 10/08/18  Yes Einar Pheasant, MD      VITAL SIGNS:  Blood pressure (!) 97/53, pulse 76, temperature 98 F (36.7 C), temperature source Tympanic, resp. rate 20, height 4\' 11"  (1.499 m), weight 62.6 kg, SpO2 100 %.  PHYSICAL EXAMINATION:  GENERAL:  85 y.o.-year-old patient lying in the bed with no acute distress.  Patient appears pale EYES: Pupils equal, round, reactive to light and accommodation. No scleral icterus. Extraocular muscles intact.  HEENT: Head atraumatic, normocephalic. Oropharynx and nasopharynx clear.  NECK:  Supple, no jugular venous distention. No thyroid enlargement, no tenderness.  LUNGS: Normal breath sounds bilaterally, no wheezing, rales,rhonchi or crepitation. No use of accessory  muscles of respiration.  CARDIOVASCULAR: S1, S2 normal. No murmurs, rubs, or gallops.  ABDOMEN: Soft, nontender, nondistended. Bowel sounds present. No organomegaly or mass.  EXTREMITIES: No pedal edema, cyanosis, or clubbing.  NEUROLOGIC: Cranial nerves II through XII are intact. Muscle strength 5/5 in all extremities. Sensation intact. Gait not checked.  PSYCHIATRIC: The patient is alert and oriented x 3.  SKIN: No obvious rash, lesion, or ulcer.   LABORATORY PANEL:   CBC Recent Labs  Lab 02/02/19 0943  WBC 7.1  HGB 7.5*  HCT 24.2*  PLT 191   ------------------------------------------------------------------------------------------------------------------  Chemistries  Recent Labs  Lab 02/02/19 0943  NA 141  K 4.1  CL 111  CO2 20*  GLUCOSE 158*  BUN 16  CREATININE 1.79*  CALCIUM 8.7*  AST 42*  ALT 24  ALKPHOS 46  BILITOT 0.4   ------------------------------------------------------------------------------------------------------------------  Cardiac Enzymes Recent Labs  Lab 02/02/19 0943  TROPONINI 0.67*   ------------------------------------------------------------------------------------------------------------------  RADIOLOGY:  No results found.  EKG:   Orders placed or performed during the hospital encounter of 02/02/19  . EKG 12-Lead  . EKG 12-Lead  EKG done at the time of event showed atrial fibrillation with controlled ventricular response, after that patient found to have 8second pause.  IMPRESSION AND PLAN:   85 year old female patient with history of CAD status post cardiac cath in 2002 comes in to have elective EGD, colonoscopy for black stool and found to have A. fib with sinus pauses. 1.  New onset atrial fibrillation with sinus pauses, patient developed atrial fibrillation followed by 8-second pause and then converted to sinus rhythm, discussed with Dr. Ubaldo Glassing recommended admission to telemetry, discontinue beta-blockers at this time, use of  transcutaneous pacemaker if needed if patient develops prolonged sinus pause again. 2.  Symptomatic anemia with hemoglobin 7.5 transfuse 1 unit of PRBC, discussed with patient daughter, she is agreeable for this. 3.  Hypotension likely due to dehydration, continue gentle hydration 4.  Acute on chronic kidney injury CKD stage III, patient is unaware of kidney problem but looking at her labs her baseline creatinine is around 1.63.  Today it is 1.79 with a BUN of 16. #5. diabetes mellitus type 2, hold metformin secondary to chronic renal insufficiency. #6. history of black stool, going on for a long time as per the patient iron deficiency anemia, patient has CKD stage III-IV, gets iron shots at cancer center.  Continue PPIs. All the records are reviewed and case discussed with ED provider. Management plans discussed with the patient, family and they are in agreement.  CODE STATUS: Full code  TOTAL TIME TAKING CARE OF THIS PATIENT: 55 minutes.  Discussed with Dr. Ubaldo Glassing, Dr. Dr. Ola Spurr from anesthesia, patient's daughter. Epifanio Lesches M.D on 02/02/2019 at 11:18 AM  Between 7am to 6pm - Pager - [phone]  After 6pm go to [email or web address] - password [email or web address] [facility]  Office  [phone]  CC: Primary care physician; Einar Pheasant, MD  Note: This dictation was prepared with Dragon dictation along with smaller phrase technology. Any transcriptional errors that result from this process are unintentional.

## 2019-02-02 NOTE — Progress Notes (Signed)
Patient complained of having a hard time breathing notified Dr. Ola Spurr and she ordered Duoneb. Patient respond quickly. Patient stated she felt better.

## 2019-02-02 NOTE — Consult Note (Addendum)
Cardiology Consultation Note    Patient ID: Jocelyn Elliott, MRN: [id number], DOB/AGE: 1932-01-22 85 y.o. Admit date: 02/02/2019   Date of Consult: 02/02/2019 Primary Physician: Einar Pheasant, MD Primary Cardiologist: Dr. Saralyn Pilar  Chief Complaint: transient chb Reason for Consultation: tansient chb Requesting MD: Dr. Kayleen Memos  HPI: Jocelyn Elliott is a 85 y.o. female with history of coronary artery disease status post cardiac cath in 2002 revealing an occluded RCA with no high-grade disease elsewhere, history of hypertension, hyperlipidemia, diabetes, status post right carotid endarterectomy who had no history of syncope or presyncope.  She presented after prep for colonoscopy and endoscopy.  During induction of propofol patient developed an 8-second pause.  It appears she had on monitor conversion to atrial fibrillation followed by delayed sinus node recovery.  Is of note the patient is on ramipril, rosuvastatin, metoprolol succinate 100 mg twice daily as an outpatient.  She has been compliant with this medication.  Per the patient and her daughter she is again had no syncope or lightheadedness.  She had a brief episode of chest pain on occasion felt to be secondary to anxiety.  Electrocardiogram done at the time of this event showed probable atrial fibrillation with controlled ventricular response.  There are no ischemic changes.  Electrolytes are pending.  Past Medical History:  Diagnosis Date  . Arthritis   . AV malformation of gastrointestinal tract   . Blood in stool   . CAD (coronary artery disease)   . Carotid arterial disease ([place])   . Cataracts, bilateral   . Chronic blood loss anemia   . Chronic cystitis   . Depression   . Diabetes mellitus ([place])   . GERD (gastroesophageal reflux disease)   . Hyperlipidemia   . Hypertension   . IDA (iron deficiency anemia)   . Stress incontinence       Surgical History:  Past Surgical History:  Procedure Laterality Date  .  ABDOMINAL HYSTERECTOMY  1972   ovaries left in place  . APPENDECTOMY  1992  . BACK SURGERY  06/08/2010   L3, L4, L5  . CAROTID ARTERY ANGIOPLASTY  11/03  . [name], 92   right then left   . CHOLECYSTECTOMY  1993  . FOOT SURGERY  06/2007   Left  . right carotid artery surgery  01/09/13   Dr. Delana Meyer @ [facility]  . TOTAL HIP ARTHROPLASTY  05/03/2011   left 12, right 11/07     Home Meds: Prior to Admission medications   Medication Sig Start Date End Date Taking? Authorizing Provider  aspirin 81 MG tablet Take 81 mg by mouth daily.   Yes [provider]  Biotin 5000 MCG CAPS Take 1 capsule by mouth daily.   Yes [provider]  cefdinir (OMNICEF) 300 MG capsule Take 1 capsule (300 mg total) by mouth 2 (two) times daily. 10/15/18  Yes Einar Pheasant, MD  cetirizine (ZYRTEC) 10 MG tablet Take 10 mg by mouth daily.   Yes [provider]  metFORMIN (GLUCOPHAGE) 500 MG tablet TAKE 1 TABLET DAILY WITH BREAKFAST 01/02/19  Yes Einar Pheasant, MD  metoprolol succinate (TOPROL-XL) 100 MG 24 hr tablet TAKE 1 TABLET TWICE A DAY 08/11/18  Yes Einar Pheasant, MD  Omega-3 Fatty Acids (FISH OIL) 1200 MG CAPS Take 1 capsule by mouth daily.   Yes [provider]  omeprazole (PRILOSEC) 20 MG capsule Take 20 mg by mouth daily.   Yes [provider]  ondansetron (ZOFRAN) 4  MG tablet Take 1 tablet (4 mg total) by mouth every 8 (eight) hours as needed for nausea or vomiting. 07/10/18  Yes Guse, Jacquelynn Cree, FNP  ramipril (ALTACE) 10 MG capsule Take 1 capsule (10 mg total) by mouth daily. 09/04/12  Yes Einar Pheasant, MD  rosuvastatin (CRESTOR) 40 MG tablet Take 1 tablet (40 mg total) by mouth daily. 06/12/18  Yes Einar Pheasant, MD  traZODone (DESYREL) 50 MG tablet Take 0.5-1 tablets (25-50 mg total) by mouth at bedtime as needed for sleep. 10/08/18  Yes Einar Pheasant, MD    Inpatient Medications:   . sodium chloride 1,000 mL (02/02/19 0807)  .  sodium chloride    . piperacillin-tazobactam    . piperacillin-tazobactam 3.375 g (02/02/19 0810)    Allergies:  Allergies  Allergen Reactions  . Ciprofloxacin   . Levaquin [Levofloxacin]   . Tequin [Gatifloxacin] Hives and Rash    Social History   Socioeconomic History  . Marital status: Widowed    Spouse name: Not on file  . Number of children: 1  . Years of education: 45  . Highest education level: Not on file  Occupational History  . Occupation: Retired  Scientific laboratory technician  . Financial resource strain: Not on file  . Food insecurity:    Worry: Not on file    Inability: Not on file  . Transportation needs:    Medical: Not on file    Non-medical: Not on file  Tobacco Use  . Smoking status: Former Smoker    Types: Cigarettes    Last attempt to quit: 11/19/1989    Years since quitting: 29.2  . Smokeless tobacco: Never Used  Substance and Sexual Activity  . Alcohol use: No    Alcohol/week: 0.0 standard drinks  . Drug use: No  . Sexual activity: Not on file  Lifestyle  . Physical activity:    Days per week: Not on file    Minutes per session: Not on file  . Stress: Not on file  Relationships  . Social connections:    Talks on phone: Not on file    Gets together: Not on file    Attends religious service: Not on file    Active member of club or organization: Not on file    Attends meetings of clubs or organizations: Not on file    Relationship status: Not on file  . Intimate partner violence:    Fear of current or ex partner: Not on file    Emotionally abused: Not on file    Physically abused: Not on file    Forced sexual activity: Not on file  Other Topics Concern  . Not on file  Social History Narrative   Regular exercise-no   Caffeine Use-yes     Family History  Problem Relation Age of Onset  . Hyperlipidemia Father   . Heart disease Father        myocardial infarction  . Hypertension Father        Parent  . Arthritis Other        parent  . Diabetes  Other        nephew  . Cervical cancer Sister   . Rectal cancer Sister   . Breast cancer Neg Hx      Review of Systems: A 12-system review of systems was performed and is negative except as noted in the HPI.  Labs: No results for input(s): CKTOTAL, CKMB, TROPONINI in the last 72 hours. Lab Results  Component Value Date  WBC 7.1 02/02/2019   HGB 7.5 (L) 02/02/2019   HCT 24.2 (L) 02/02/2019   MCV 97.2 02/02/2019   PLT 191 02/02/2019   No results for input(s): NA, K, CL, CO2, BUN, CREATININE, CALCIUM, PROT, BILITOT, ALKPHOS, ALT, AST, GLUCOSE in the last 168 hours.  Invalid input(s): LABALBU Lab Results  Component Value Date   CHOL 127 10/13/2018   HDL 47.00 10/13/2018   LDLCALC 47 10/13/2018   TRIG 166.0 (H) 10/13/2018   No results found for: DDIMER  Radiology/Studies:  No results found.  Wt Readings from Last 3 Encounters:  02/02/19 62.6 kg  12/03/18 63.1 kg  12/01/18 63 kg    EKG: Atrial fibrillation with variable ventricular response  Physical Exam:  Blood pressure (!) 97/53, pulse 76, temperature 98 F (36.7 C), temperature source Tympanic, resp. rate 20, height 4\' 11"  (1.499 m), weight 62.6 kg, SpO2 100 %. Body mass index is 27.87 kg/m. General: Well developed, well nourished, in no acute distress. Head: Normocephalic, atraumatic, sclera non-icteric, no xanthomas, nares are without discharge.  Neck: Negative for carotid bruits. JVD not elevated. Lungs: Clear bilaterally to auscultation without wheezes, rales, or rhonchi. Breathing is unlabored. Heart: RRR with S1 S2. No murmurs, rubs, or gallops appreciated. Abdomen: Soft, non-tender, non-distended with normoactive bowel sounds. No hepatomegaly. No rebound/guarding. No obvious abdominal masses. Msk:  Strength and tone appear normal for age. Extremities: No clubbing or cyanosis. No edema.  Distal pedal pulses are 2+ and equal bilaterally. Neuro: Alert and oriented X 3. No facial asymmetry. No focal deficit.  Moves all extremities spontaneously. Psych:  Responds to questions appropriately with a normal affect.     Assessment and Plan  Patient is an 85 year old female with history of coronary disease status post cardiac cath in 2002 treated medically.  She has been on beta-blockers for hypertension.  During induction of propofol for endoscopy/colonoscopy today she developed what appeared to be atrial fibrillation followed by an 8-second pause.  She then converted back to sinus rhythm.  She had one other episode of this in the recovery area after waking up from propofol.  Etiology of the event is unclear.  May have been related to the prep/propofol.  Patient also appears to have sick sinus syndrome with long pauses after converting from A. fib back to sinus rhythm.  Will hold metoprolol and follow heart rate.  We will also recommend admission with telemetry to follow for any further events.  If she has further events, consideration for backup pacing could be warranted.  Patient does not appear to require temporary pacemaker at present.  Would agree with placing pads for external pacing but would not turn on the pacemaker.  Laboratories revealed hemoglobin 7.5.  Patient developed further chest pain.  Electrocardiogram revealed sinus rhythm with nonspecific ST-T wave changes.  Patient not a candidate for urgent cardiac cath given melena as well as anemia.  Would transfuse packed red blood cells 1 to 2 units of soon as possible.  Will follow for further arrhythmia or ischemia.  Do to transient episodes of hypotension, requiring intermitent use of pressors, will change admision to stepdown for closer monitoring. Pt will need transfusion of prbcs asap.   Signed, Teodoro Spray MD 02/02/2019, 10:21 AM Pager: [phone] \

## 2019-02-02 NOTE — Progress Notes (Signed)
Patients blood pressure dropped notified CRNA and she administered another dose of  Vasopressin. Patient responded positively again.

## 2019-02-02 NOTE — Anesthesia Preprocedure Evaluation (Addendum)
Anesthesia Evaluation  Patient identified by MRN, date of birth, ID band Patient awake    Reviewed: Allergy & Precautions, H&P , NPO status , Patient's Chart, lab work & pertinent test results  Airway Mallampati: II  TM Distance: >3 FB     Dental  (+) Upper Dentures, Lower Dentures   Pulmonary neg COPD, neg recent URI, former smoker,           Cardiovascular hypertension, (-) angina+ CAD  (-) Past MI, (-) Cardiac Stents and (-) CABG (-) dysrhythmias      Neuro/Psych PSYCHIATRIC DISORDERS Anxiety Depression negative neurological ROS     GI/Hepatic Neg liver ROS, GERD  ,  Endo/Other  diabetes  Renal/GU CRFRenal disease  negative genitourinary   Musculoskeletal   Abdominal   Peds  Hematology  (+) Blood dyscrasia, anemia ,   Anesthesia Other Findings Past Medical History: No date: Arthritis No date: AV malformation of gastrointestinal tract No date: Blood in stool No date: CAD (coronary artery disease) No date: Carotid arterial disease ([facility]) No date: Cataracts, bilateral No date: Chronic blood loss anemia No date: Chronic cystitis No date: Depression No date: Diabetes mellitus ([facility]) No date: GERD (gastroesophageal reflux disease) No date: Hyperlipidemia No date: Hypertension No date: IDA (iron deficiency anemia) No date: Stress incontinence  Past Surgical History: 1972: ABDOMINAL HYSTERECTOMY     Comment:  ovaries left in place 50: APPENDECTOMY 06/08/2010: BACK SURGERY     Comment:  L3, L4, L5 11/03: CAROTID ARTERY ANGIOPLASTY 1991, 92: CARPAL TUNNEL RELEASE     Comment:  right then left  1993: CHOLECYSTECTOMY 06/2007: FOOT SURGERY     Comment:  Left 01/09/13: right carotid artery surgery     Comment:  Dr. Delana Meyer @ [facility] 05/03/2011: TOTAL HIP ARTHROPLASTY     Comment:  left 12, right 11/07  BMI    Body Mass Index:  27.87 kg/m      Reproductive/Obstetrics negative OB ROS                             Anesthesia Physical Anesthesia Plan  ASA: III  Anesthesia Plan: General   Post-op Pain Management:    Induction:   PONV Risk Score and Plan: Propofol infusion and TIVA  Airway Management Planned: Nasal Cannula  Additional Equipment:   Intra-op Plan:   Post-operative Plan:   Informed Consent: I have reviewed the patients History and Physical, chart, labs and discussed the procedure including the risks, benefits and alternatives for the proposed anesthesia with the patient or authorized representative who has indicated his/her understanding and acceptance.     Dental Advisory Given  Plan Discussed with: Anesthesiologist and CRNA  Anesthesia Plan Comments:         Anesthesia Quick Evaluation

## 2019-02-02 NOTE — Progress Notes (Signed)
PHARMACIST - PHYSICIAN ORDER COMMUNICATION  CONCERNING: P&T Medication Policy on Herbal Medications  DESCRIPTION:  This patient's order for:  Biotin  has been noted.  This product(s) is classified as an "herbal" or natural product. Due to a lack of definitive safety studies or FDA approval, nonstandard manufacturing practices, plus the potential risk of unknown drug-drug interactions while on inpatient medications, the Pharmacy and Therapeutics Committee does not permit the use of "herbal" or natural products of this type within [facility].   ACTION TAKEN: The pharmacy department is unable to verify this order at this time.

## 2019-02-02 NOTE — H&P (Signed)
Primary Care Physician:  Einar Pheasant, MD Primary Gastroenterologist:  Dr. Vira Agar  Pre-Procedure History & Physical: HPI:  Jocelyn Elliott is a 85 y.o. female is here for an endoscopy and colonoscopy.   Past Medical History:  Diagnosis Date  . Arthritis   . AV malformation of gastrointestinal tract   . Blood in stool   . CAD (coronary artery disease)   . Carotid arterial disease ([place])   . Cataracts, bilateral   . Chronic blood loss anemia   . Chronic cystitis   . Depression   . Diabetes mellitus ([place])   . GERD (gastroesophageal reflux disease)   . Hyperlipidemia   . Hypertension   . IDA (iron deficiency anemia)   . Stress incontinence     Past Surgical History:  Procedure Laterality Date  . ABDOMINAL HYSTERECTOMY  1972   ovaries left in place  . APPENDECTOMY  1992  . BACK SURGERY  06/08/2010   L3, L4, L5  . CAROTID ARTERY ANGIOPLASTY  11/03  . [place], 92   right then left   . CHOLECYSTECTOMY  1993  . FOOT SURGERY  06/2007   Left  . right carotid artery surgery  01/09/13   Dr. Delana Meyer @ [facility]  . TOTAL HIP ARTHROPLASTY  05/03/2011   left 12, right 11/07    Prior to Admission medications   Medication Sig Start Date End Date Taking? Authorizing Provider  aspirin 81 MG tablet Take 81 mg by mouth daily.   Yes [provider]  Biotin 5000 MCG CAPS Take 1 capsule by mouth daily.   Yes [provider]  cefdinir (OMNICEF) 300 MG capsule Take 1 capsule (300 mg total) by mouth 2 (two) times daily. 10/15/18  Yes Einar Pheasant, MD  cetirizine (ZYRTEC) 10 MG tablet Take 10 mg by mouth daily.   Yes [provider]  metFORMIN (GLUCOPHAGE) 500 MG tablet TAKE 1 TABLET DAILY WITH BREAKFAST 01/02/19  Yes Einar Pheasant, MD  metoprolol succinate (TOPROL-XL) 100 MG 24 hr tablet TAKE 1 TABLET TWICE A DAY 08/11/18  Yes Einar Pheasant, MD  Omega-3 Fatty Acids (FISH OIL) 1200 MG CAPS Take 1 capsule by mouth daily.   Yes [provider]  omeprazole (PRILOSEC) 20 MG capsule Take 20 mg by mouth daily.   Yes [provider]  ondansetron (ZOFRAN) 4 MG tablet Take 1 tablet (4 mg total) by mouth every 8 (eight) hours as needed for nausea or vomiting. 07/10/18  Yes Guse, Jacquelynn Cree, FNP  ramipril (ALTACE) 10 MG capsule Take 1 capsule (10 mg total) by mouth daily. 09/04/12  Yes Einar Pheasant, MD  rosuvastatin (CRESTOR) 40 MG tablet Take 1 tablet (40 mg total) by mouth daily. 06/12/18  Yes Einar Pheasant, MD  traZODone (DESYREL) 50 MG tablet Take 0.5-1 tablets (25-50 mg total) by mouth at bedtime as needed for sleep. 10/08/18  Yes Einar Pheasant, MD    Allergies as of 12/05/2018 - Review Complete 12/03/2018  Allergen Reaction Noted  . Ciprofloxacin  09/04/2012  . Levaquin [levofloxacin]  09/04/2012  . Tequin [gatifloxacin] Hives and Rash 05/29/2013    Family History  Problem Relation Age of Onset  . Hyperlipidemia Father   . Heart disease Father        myocardial infarction  . Hypertension Father        Parent  . Arthritis Other        parent  . Diabetes Other  nephew  . Cervical cancer Sister   . Rectal cancer Sister   . Breast cancer Neg Hx     Social History   Socioeconomic History  . Marital status: Widowed    Spouse name: Not on file  . Number of children: 1  . Years of education: 23  . Highest education level: Not on file  Occupational History  . Occupation: Retired  Scientific laboratory technician  . Financial resource strain: Not on file  . Food insecurity:    Worry: Not on file    Inability: Not on file  . Transportation needs:    Medical: Not on file    Non-medical: Not on file  Tobacco Use  . Smoking status: Former Smoker    Types: Cigarettes    Last attempt to quit: 11/19/1989    Years since quitting: 29.2  . Smokeless tobacco: Never Used  Substance and Sexual Activity  . Alcohol use: No    Alcohol/week: 0.0 standard drinks  . Drug use: No  . Sexual activity: Not on file   Lifestyle  . Physical activity:    Days per week: Not on file    Minutes per session: Not on file  . Stress: Not on file  Relationships  . Social connections:    Talks on phone: Not on file    Gets together: Not on file    Attends religious service: Not on file    Active member of club or organization: Not on file    Attends meetings of clubs or organizations: Not on file    Relationship status: Not on file  . Intimate partner violence:    Fear of current or ex partner: Not on file    Emotionally abused: Not on file    Physically abused: Not on file    Forced sexual activity: Not on file  Other Topics Concern  . Not on file  Social History Narrative   Regular exercise-no   Caffeine Use-yes    Review of Systems: See HPI, otherwise negative ROS  Physical Exam: BP (!) 110/99   Pulse (!) 40   Temp 98.3 F (36.8 C) (Tympanic)   Resp 18   Ht 4\' 11"  (1.499 m)   Wt 62.6 kg   SpO2 98%   BMI 27.87 kg/m  General:   Alert,  pleasant and cooperative in NAD Head:  Normocephalic and atraumatic. Neck:  Supple; no masses or thyromegaly. Lungs:  Clear throughout to auscultation.    Heart:  Regular rate and rhythm.  1/6 SEM. Abdomen:  Soft, nontender and nondistended. Normal bowel sounds, without guarding, and without rebound.   Neurologic:  Alert and  oriented x4;  grossly normal neurologically.  Impression/Plan: Jocelyn Elliott is here for an endoscopy and colonoscopy to be performed for Iron def anemia and heme positive stool  Risks, benefits, limitations, and alternatives regarding  endoscopy and colonoscopy have been reviewed with the patient.  Questions have been answered.  All parties agreeable.   Gaylyn Cheers, MD  02/02/2019, 8:16 AM

## 2019-02-02 NOTE — Transfer of Care (Signed)
Immediate Anesthesia Transfer of Care Note  Patient: WRENLEY SAYED  Procedure(s) Performed: ESOPHAGOGASTRODUODENOSCOPY (EGD) (N/A ) COLONOSCOPY WITH PROPOFOL (N/A )  Patient Location: PACU and Endoscopy Unit  Anesthesia Type:General  Level of Consciousness: awake, alert , oriented and patient cooperative  Airway & Oxygen Therapy: Patient Spontanous Breathing and Patient connected to nasal cannula oxygen  Post-op Assessment: Report given to RN and Post -op Vital signs reviewed and stable  Post vital signs: Reviewed and stable  Last Vitals:  Vitals Value Taken Time  BP 113/57 02/02/2019  8:42 AM  Temp 36.7 C 02/02/2019  8:42 AM  Pulse 56 02/02/2019  8:52 AM  Resp 13 02/02/2019  8:52 AM  SpO2 100 % 02/02/2019  8:52 AM  Vitals shown include unvalidated device data.  Last Pain:  Vitals:   02/02/19 0842  TempSrc: Tympanic  PainSc: 0-No pain         Complications: No apparent anesthesia complications

## 2019-02-03 ENCOUNTER — Inpatient Hospital Stay: Payer: Medicare Other

## 2019-02-03 LAB — CBC
HEMATOCRIT: 36.2 % (ref 36.0–46.0)
Hemoglobin: 11.3 g/dL — ABNORMAL LOW (ref 12.0–15.0)
MCH: 30.2 pg (ref 26.0–34.0)
MCHC: 31.2 g/dL (ref 30.0–36.0)
MCV: 96.8 fL (ref 80.0–100.0)
Platelets: 179 10*3/uL (ref 150–400)
RBC: 3.74 MIL/uL — ABNORMAL LOW (ref 3.87–5.11)
RDW: 14.3 % (ref 11.5–15.5)
WBC: 9.5 10*3/uL (ref 4.0–10.5)
nRBC: 0 % (ref 0.0–0.2)

## 2019-02-03 LAB — GLUCOSE, CAPILLARY
Glucose-Capillary: 143 mg/dL — ABNORMAL HIGH (ref 70–99)
Glucose-Capillary: 117 mg/dL — ABNORMAL HIGH (ref 70–99)
Glucose-Capillary: 174 mg/dL — ABNORMAL HIGH (ref 70–99)

## 2019-02-03 LAB — BRAIN NATRIURETIC PEPTIDE: B Natriuretic Peptide: 727 pg/mL — ABNORMAL HIGH (ref 0.0–100.0)

## 2019-02-03 LAB — BASIC METABOLIC PANEL
Anion gap: 8 (ref 5–15)
BUN: 14 mg/dL (ref 8–23)
CO2: 20 mmol/L — ABNORMAL LOW (ref 22–32)
Calcium: 8.4 mg/dL — ABNORMAL LOW (ref 8.9–10.3)
Chloride: 113 mmol/L — ABNORMAL HIGH (ref 98–111)
Creatinine, Ser: 1.44 mg/dL — ABNORMAL HIGH (ref 0.44–1.00)
GFR calc Af Amer: 39 mL/min — ABNORMAL LOW (ref >60)
GFR calc non Af Amer: 33 mL/min — ABNORMAL LOW (ref >60)
Glucose, Bld: 157 mg/dL — ABNORMAL HIGH (ref 70–99)
Potassium: 3.6 mmol/L (ref 3.5–5.1)
Sodium: 141 mmol/L (ref 135–145)

## 2019-02-03 MED ORDER — ALPRAZOLAM 0.5 MG PO TABS
0.5000 mg | ORAL_TABLET | Freq: Two times a day (BID) | ORAL | Status: DC | PRN
  Administered 2019-02-03: 0.5 mg via ORAL
  Filled 2019-02-03: qty 1

## 2019-02-03 MED ORDER — ROSUVASTATIN CALCIUM 10 MG PO TABS
40.0000 mg | ORAL_TABLET | Freq: Every day | ORAL | Status: DC
  Administered 2019-02-03: 40 mg via ORAL
  Filled 2019-02-03: qty 4

## 2019-02-03 MED ORDER — INSULIN ASPART 100 UNIT/ML ~~LOC~~ SOLN
0.0000 [IU] | Freq: Three times a day (TID) | SUBCUTANEOUS | Status: DC
  Filled 2019-02-03: qty 1

## 2019-02-03 MED ORDER — FUROSEMIDE 10 MG/ML IJ SOLN
40.0000 mg | Freq: Once | INTRAMUSCULAR | Status: AC
  Administered 2019-02-03: 40 mg via INTRAVENOUS
  Filled 2019-02-03: qty 4

## 2019-02-03 NOTE — Evaluation (Signed)
Physical Therapy Evaluation Patient Details Name: Jocelyn Elliott MRN: [id number] DOB: 1932-04-16 Today's Date: 02/03/2019   History of Present Illness  85 y/o female who was to have colonoscopy and had bradycardia issue and was admitted to CCU for cardiac monitoring.   Clinical Impression  Pt eager to work with PT and see how well she can do on a longer walker.  She was able to walk ~200 ft with [facility] w/ 1 small stagger step but ultimately did well.  PT adjusted her cane to more appropriate height with good results, however still recommending that she use a walker initially on going home. She did have some fatigue with the effort with O2 dropping to mid/upper 80s and HR to 110s, but these quickly returned to her pre-ambulation levels on sitting.  Pt is slower, weaker and less steady than baseline and needs HHPT, but should be safe to return home once medically cleared.    Follow Up Recommendations Home health PT    Equipment Recommendations  None recommended by PT    Recommendations for Other Services       Precautions / Restrictions Precautions Precautions: Fall Restrictions Weight Bearing Restrictions: No      Mobility  Bed Mobility               General bed mobility comments: In recliner pre and post session, per nursing she got up w/o assist earlier today  Transfers Overall transfer level: Independent Equipment used: Straight cane             General transfer comment: Pt confident in getting to standing, no assist needed, use of SPC to maintain steady balance  Ambulation/Gait Ambulation/Gait assistance: Min guard Gait Distance (Feet): 200 Feet Assistive device: Straight cane       General Gait Details: Pt able to maintain consistent but slow cadence around the nurses' station.  She had one issues with stagger step with stopping, later stopped and maintained balance w/o issue.  PT did drop SPC 2" and she looked better and reported feeling more stable.  Pt's  O2 dropped from mid 90s to mid/upper 80s on room air, and HR increased from 80s to 110s with some fatigue.    Stairs            Wheelchair Mobility    Modified Rankin (Stroke Patients Only)       Balance Overall balance assessment: Modified Independent                                           Pertinent Vitals/Pain Pain Assessment: No/denies pain    Home Living Family/patient expects to be discharged to:: Private residence Living Arrangements: Alone Available Help at Discharge: Family(daughter and son-in-law live next door)                  Prior Function Level of Independence: Independent with assistive device(s)         Comments: Pt uses SPC outside the home, has RW, at times uses no AD in home     Hand Dominance        Extremity/Trunk Assessment   Upper Extremity Assessment Upper Extremity Assessment: Overall WFL for tasks assessed(age appropriate limitations)    Lower Extremity Assessment Lower Extremity Assessment: Overall WFL for tasks assessed(L hip grossly 3+/5, otherwise age approp limitations only)       Communication   Communication:  No difficulties  Cognition Arousal/Alertness: Awake/alert Behavior During Therapy: WFL for tasks assessed/performed Overall Cognitive Status: Within Functional Limits for tasks assessed                                        General Comments      Exercises     Assessment/Plan    PT Assessment Patient needs continued PT services  PT Problem List Decreased strength;Decreased balance;Decreased range of motion;Decreased activity tolerance;Decreased mobility;Decreased knowledge of use of DME;Decreased safety awareness;Cardiopulmonary status limiting activity       PT Treatment Interventions DME instruction;Gait training;Stair training;Functional mobility training;Therapeutic activities;Therapeutic exercise;Balance training;Neuromuscular re-education;Patient/family  education    PT Goals (Current goals can be found in the Care Plan section)  Acute Rehab PT Goals Patient Stated Goal: go home PT Goal Formulation: With patient/family Time For Goal Achievement: 02/17/19 Potential to Achieve Goals: Good    Frequency Min 2X/week   Barriers to discharge        Co-evaluation               AM-PAC PT "6 Clicks" Mobility  Outcome Measure Help needed turning from your back to your side while in a flat bed without using bedrails?: None Help needed moving from lying on your back to sitting on the side of a flat bed without using bedrails?: None Help needed moving to and from a bed to a chair (including a wheelchair)?: None Help needed standing up from a chair using your arms (e.g., wheelchair or bedside chair)?: None Help needed to walk in hospital room?: None Help needed climbing 3-5 steps with a railing? : None 6 Click Score: 24    End of Session Equipment Utilized During Treatment: Gait belt Activity Tolerance: Patient tolerated treatment well Patient left: in chair;with call bell/phone within reach;with family/visitor present Nurse Communication: Mobility status(vitals during ambulation) PT Visit Diagnosis: Muscle weakness (generalized) (M62.81);Difficulty in walking, not elsewhere classified (R26.2)    Time: 6160-7371 PT Time Calculation (min) (ACUTE ONLY): 25 min   Charges:   PT Evaluation $PT Eval Low Complexity: 1 Low          Kreg Shropshire, DPT 02/03/2019, 5:39 PM

## 2019-02-03 NOTE — TOC Initial Note (Signed)
Transition of Care [facility]) - Initial/Assessment Note    Patient Details  Name: Jocelyn Elliott MRN: [id number] Date of Birth: [birth year]  Transition of Care [facility]) CM/SW Contact:    Shelbie Hutching, RN Phone Number: 02/03/2019, 2:14 PM  Clinical Narrative:                 Patient is admitted for bradycardia.  Patient was scheduled for an EDG and colonoscopy but the procedure has to be postponed after the patient became bradycardic and unresponsive in the operative area.  Patient is currently in the ICU waiting on a telemetry bed.  Patient reports she feels very weak.  Daughter is at the bedside.  Patient is from home and lives alone.  Patient is independent in ADL's and drives, she does use a cane.  Patient has been to short term rehab in the past at [facility], her daughter works in the business office there, and home health services with [facility].  Goal is for patient to be able to go home but if recommended she will go to SNF - wants [facility].  Patient is also okay with home health services being set up if she goes home and would like [facility] or [facility].    Expected Discharge Plan: [place]     Patient Goals and CMS Choice Patient states their goals for this hospitalization and ongoing recovery are:: Find out what is going on causing me to feel so bad  CMS Medicare.gov Compare Post Acute Care list provided to:: Patient Choice offered to / list presented to : Patient, Adult Children  Expected Discharge Plan and Services Expected Discharge Plan: [place] Discharge Planning Services: CM Consult Post Acute Care Choice: [place] arrangements for the past 2 months: Single Family Home                          Prior Living Arrangements/Services Living arrangements for the past 2 months: Single Family Home Lives with:: Self   Do you feel safe going back to the place where you live?: Yes      Need for Family  Participation in Patient Care: Yes (Comment)(daughter) Care giver support system in place?: Yes (comment)(daughter)      Activities of Daily Living      Permission Sought/Granted                  Emotional Assessment Appearance:: Appears stated age Attitude/Demeanor/Rapport: Engaged Affect (typically observed): Accepting Orientation: : Oriented to Self, Oriented to Place, Oriented to  Time, Oriented to Situation Alcohol / Substance Use: Not Applicable Psych Involvement: No (comment)  Admission diagnosis:  Bradycardia [R00.1] Patient Active Problem List   Diagnosis Date Noted  . Bradycardia 02/02/2019  . Afib ([place]) 02/02/2019  . CKD (chronic kidney disease) stage 3, GFR 30-59 ml/min (HCC) 10/18/2018  . Rash 06/14/2018  . Dizziness 05/22/2017  . Iron deficiency anemia due to chronic blood loss 09/20/2016  . Hypotension 04/06/2016  . Respiratory infection 04/06/2016  . Health care maintenance 10/28/2015  . SOB (shortness of breath) on exertion 09/20/2015  . Knee pain, bilateral 09/20/2015  . Sleeping difficulty 07/24/2015  . Hepatomegaly 07/24/2015  . Generalized anxiety disorder 04/23/2015  . UTI (urinary tract infection) 05/22/2014  . Hip pain 10/11/2013  . Tendonitis 07/19/2013  . Leg numbness 04/07/2013  . CAD (coronary artery disease) 09/05/2012  . Hypertension 09/05/2012  . Hypercholesteremia 09/05/2012  .  Diabetes mellitus ([place]) 09/05/2012  . Carotid arterial disease ([place]) 09/05/2012  . Anemia 09/05/2012   PCP:  Einar Pheasant, MD Pharmacy:   [id number] [place], [address] [place] [address] Phone: [phone] Fax: [phone]  [facility] [place], [place] [address] [place] [address] Phone: [phone] Fax: [phone]     Social Determinants of Health (SDOH) Interventions    Readmission Risk Interventions 30 Day Unplanned Readmission Risk Score      Admission (Current) from 02/02/2019 in [facility] ICU/CCU  30 Day Unplanned Readmission Risk Score (%)  13 Filed at 02/03/2019 1200     This score is the patient's risk of an unplanned readmission within 30 days of being discharged (0 -100%). The score is based on dignosis, age, lab data, medications, orders, and past utilization.   Low:  0-14.9   Medium: 15-21.9   High: 22-29.9   Extreme: 30 and above       No flowsheet data found.

## 2019-02-03 NOTE — Progress Notes (Signed)
Patient Name: Jocelyn Elliott Date of Encounter: 02/03/2019  Hospital Problem List     Active Problems:   Bradycardia   Afib [facility])    Patient Profile     Patient with history of anemia and melena admitted after developing atrial fibrillation with intermittent high-grade heart block during GI procedure.  Currently in sinus rhythm and stable with no dysrhythmia.  Asymptomatic post transfusion.  Subjective   Sitting up eating breakfast with no chest pain or shortness of breath.  Inpatient Medications    . insulin aspart  0-9 Units Subcutaneous TID WC  . mouth rinse  15 mL Mouth Rinse BID  . omega-3 acid ethyl esters  1 capsule Oral Daily  . pantoprazole  40 mg Oral Daily  . rosuvastatin  40 mg Oral QHS    Vital Signs    Vitals:   02/03/19 0500 02/03/19 0600 02/03/19 0700 02/03/19 0800  BP: (!) 123/45 (!) 129/47 115/67   Pulse: 91 80 76   Resp: (!) 21 (!) 21 18   Temp:    98 F (36.7 C)  TempSrc:    Oral  SpO2: 94% 94% 93%   Weight:      Height:        Intake/Output Summary (Last 24 hours) at 02/03/2019 1241 Last data filed at 02/03/2019 0959 Gross per 24 hour  Intake 1464.61 ml  Output 1830 ml  Net -365.39 ml   Filed Weights   02/02/19 0750 02/03/19 0416  Weight: 62.6 kg 64.1 kg    Physical Exam    GEN: Well nourished, well developed, in no acute distress.  HEENT: normal.  Neck: Supple, no JVD, carotid bruits, or masses. Cardiac: RRR, no murmurs, rubs, or gallops. No clubbing, cyanosis, edema.  Radials/DP/PT 2+ and equal bilaterally.  Respiratory:  Respirations regular and unlabored, clear to auscultation bilaterally. GI: Soft, nontender, nondistended, BS + x 4. MS: no deformity or atrophy. Skin: warm and dry, no rash. Neuro:  Strength and sensation are intact. Psych: Normal affect.  Labs    CBC Recent Labs    02/02/19 0943 02/02/19 1401 02/03/19 0443  WBC 7.1  --  9.5  HGB 7.5* 7.5* 11.3*  HCT 24.2* 24.7* 36.2  MCV 97.2  --  96.8  PLT 191   --  841   Basic Metabolic Panel Recent Labs    02/02/19 0943 02/03/19 0443  NA 141 141  K 4.1 3.6  CL 111 113*  CO2 20* 20*  GLUCOSE 158* 157*  BUN 16 14  CREATININE 1.79* 1.44*  CALCIUM 8.7* 8.4*   Liver Function Tests Recent Labs    02/02/19 0943  AST 42*  ALT 24  ALKPHOS 46  BILITOT 0.4  PROT 5.7*  ALBUMIN 3.1*   No results for input(s): LIPASE, AMYLASE in the last 72 hours. Cardiac Enzymes Recent Labs    02/02/19 0943  CKMB 17.0*  TROPONINI 0.67*   BNP Recent Labs    02/03/19 0443  BNP 727.0*   D-Dimer No results for input(s): DDIMER in the last 72 hours. Hemoglobin A1C No results for input(s): HGBA1C in the last 72 hours. Fasting Lipid Panel No results for input(s): CHOL, HDL, LDLCALC, TRIG, CHOLHDL, LDLDIRECT in the last 72 hours. Thyroid Function Tests No results for input(s): TSH, T4TOTAL, T3FREE, THYROIDAB in the last 72 hours.  Invalid input(s): FREET3  Telemetry    Currently in sinus rhythm.  May need to resume metoprolol at some point although stable at present.  ECG  Radiology    Dg Chest Port 1 View  Result Date: 02/03/2019 CLINICAL DATA:  Acute respiratory failure EXAM: PORTABLE CHEST 1 VIEW COMPARISON:  04/06/2016 FINDINGS: Diffuse interstitial opacity with Kerley lines. Mild cardiomegaly and trace pleural effusions. IMPRESSION: CHF. Electronically Signed   By: Monte Fantasia M.D.   On: 02/03/2019 04:26    Assessment & Plan    Atrial fibrillation-occurred during propofol and post prep for colonoscopy.  No further dysrhythmia off metoprolol.  We will continue with telemetry and follow but may need to add back metoprolol at a lower dose.  Was on metoprolol succinate 100 mg twice daily.    Hypotension has improved with hydration and blood.  Acute on chronic kidney disease-improved with hydration.  Signed, Javier Docker Jamiracle Avants MD 02/03/2019, 12:41 PM  Pager: [phone]

## 2019-02-03 NOTE — Progress Notes (Signed)
[place] [facility] [place] NAME: Jocelyn Elliott    MR#:  [id number]  DATE OF BIRTH:  1932/08/05  SUBJECTIVE:   patient seen earlier this am No acute issues overnight Heart rate stable No pauses nooted  REVIEW OF SYSTEMS:    Review of Systems  Constitutional: Negative for fever, chills weight loss HENT: Negative for ear pain, nosebleeds, congestion, facial swelling, rhinorrhea, neck pain, neck stiffness and ear discharge.   Respiratory: Negative for cough, shortness of breath, wheezing  Cardiovascular: Negative for chest pain, palpitations and leg swelling.  Gastrointestinal: Negative for heartburn, abdominal pain, vomiting, diarrhea or consitpation Genitourinary: Negative for dysuria, urgency, frequency, hematuria Musculoskeletal: Negative for back pain or joint pain Neurological: Negative for dizziness, seizures, syncope, focal weakness,  numbness and headaches.  Hematological: Does not bruise/bleed easily.  Psychiatric/Behavioral: Negative for hallucinations, confusion, dysphoric mood    Tolerating Diet: yes      DRUG ALLERGIES:   Allergies  Allergen Reactions  . Ciprofloxacin   . Levaquin [Levofloxacin]   . Tequin [Gatifloxacin] Hives and Rash    VITALS:  Blood pressure 115/67, pulse 76, temperature 98 F (36.7 C), temperature source Oral, resp. rate 18, height 4\' 11"  (1.499 m), weight 64.1 kg, SpO2 93 %.  PHYSICAL EXAMINATION:  Constitutional: Appears well-developed and well-nourished. No distress. HENT: Normocephalic. Marland Kitchen Oropharynx is clear and moist.  Eyes: Conjunctivae and EOM are normal. PERRLA, no scleral icterus.  Neck: Normal ROM. Neck supple. No JVD. No tracheal deviation. CVS: RRR, S1/S2 +, no murmurs, no gallops, no carotid bruit.  Pulmonary: Effort and breath sounds normal, no stridor, rhonchi, wheezes, rales.  Abdominal: Soft. BS +,  no distension, tenderness, rebound or guarding.  Musculoskeletal: Normal range  of motion. No edema and no tenderness.  Neuro: Alert. CN 2-12 grossly intact. No focal deficits. Skin: Skin is warm and dry. No rash noted. Psychiatric: Normal mood and affect.      LABORATORY PANEL:   CBC Recent Labs  Lab 02/03/19 0443  WBC 9.5  HGB 11.3*  HCT 36.2  PLT 179   ------------------------------------------------------------------------------------------------------------------  Chemistries  Recent Labs  Lab 02/02/19 0943 02/03/19 0443  NA 141 141  K 4.1 3.6  CL 111 113*  CO2 20* 20*  GLUCOSE 158* 157*  BUN 16 14  CREATININE 1.79* 1.44*  CALCIUM 8.7* 8.4*  AST 42*  --   ALT 24  --   ALKPHOS 46  --   BILITOT 0.4  --    ------------------------------------------------------------------------------------------------------------------  Cardiac Enzymes Recent Labs  Lab 02/02/19 0943  TROPONINI 0.67*   ------------------------------------------------------------------------------------------------------------------  RADIOLOGY:  Dg Chest Port 1 View  Result Date: 02/03/2019 CLINICAL DATA:  Acute respiratory failure EXAM: PORTABLE CHEST 1 VIEW COMPARISON:  04/06/2016 FINDINGS: Diffuse interstitial opacity with Kerley lines. Mild cardiomegaly and trace pleural effusions. IMPRESSION: CHF. Electronically Signed   By: Monte Fantasia M.D.   On: 02/03/2019 04:26     ASSESSMENT AND PLAN:   85 year old female with a history of CAD, hypertension and right carotid endarterectomy who was admitted after colonoscopy due to 8-second pause and atrial fibrillation.   1.  Atrial fibrillation followed by 8-second pause (complete heart block) x2: Patient has been in normal sinus rhythm This may have been related to propofol. Continue to hold metoprolol Continue telemetry Follow-up on cardiology recommendations  2.  Acute on chronic anemia status post 1 unit PRBC: Patient will need to follow-up with GI as an outpatient once cleared by cardiology  3.  Hypotension:  Patient's blood pressure has improved  4.  Acute on chronic kidney injury stage III: Creatinine has improved  5.  Diabetes: Continue sliding scale   Management plans discussed with the patient and family and they are in agreement.  CODE STATUS:  full  TOTAL TIME TAKING CARE OF THIS PATIENT: 30 minutes.     POSSIBLE D/C tomorrow, DEPENDING ON CLINICAL CONDITION.   Bettey Costa M.D on 02/03/2019 at 12:11 PM  Between 7am to 6pm - Pager - [phone] After 6pm go to [email or web address] - password [email or web address] [place] [facility]  Office  [phone]  CC: Primary care physician; Einar Pheasant, MD  Note: This dictation was prepared with Dragon dictation along with smaller phrase technology. Any transcriptional errors that result from this process are unintentional.

## 2019-02-04 ENCOUNTER — Inpatient Hospital Stay: Payer: Medicare Other

## 2019-02-04 LAB — CBC
HCT: 29.1 % — ABNORMAL LOW (ref 36.0–46.0)
Hemoglobin: 9.2 g/dL — ABNORMAL LOW (ref 12.0–15.0)
MCH: 30 pg (ref 26.0–34.0)
MCHC: 31.6 g/dL (ref 30.0–36.0)
MCV: 94.8 fL (ref 80.0–100.0)
NRBC: 0 % (ref 0.0–0.2)
Platelets: 195 10*3/uL (ref 150–400)
RBC: 3.07 MIL/uL — ABNORMAL LOW (ref 3.87–5.11)
RDW: 14.1 % (ref 11.5–15.5)
WBC: 8.8 10*3/uL (ref 4.0–10.5)

## 2019-02-04 LAB — GLUCOSE, CAPILLARY
Glucose-Capillary: 124 mg/dL — ABNORMAL HIGH (ref 70–99)
Glucose-Capillary: 220 mg/dL — ABNORMAL HIGH (ref 70–99)

## 2019-02-04 LAB — BRAIN NATRIURETIC PEPTIDE: B Natriuretic Peptide: 305 pg/mL — ABNORMAL HIGH (ref 0.0–100.0)

## 2019-02-04 NOTE — Progress Notes (Signed)
Patient Name: Jocelyn Elliott Date of Encounter: 02/04/2019  Hospital Problem List     Active Problems:   Bradycardia   Afib [facility])    Patient Profile     Patient admitted after developing A. fib with transient asystole during propofol induction for colonoscopy.  Patient was also at the time severely anemic with a hemoglobin of 7.5.  Patient had no previous history of A. fib.  She was transfused with a hemoglobin of 9.2 this morning.  She is in sinus rhythm.  She was on metoprolol succinate at 100 mg twice daily as an outpatient.  This is been held.  Her blood pressure and heart rate have been controlled and she is asymptomatic at present  Subjective   I feel good.  I would like to go home  Inpatient Medications    . insulin aspart  0-9 Units Subcutaneous TID WC  . mouth rinse  15 mL Mouth Rinse BID  . omega-3 acid ethyl esters  1 capsule Oral Daily  . pantoprazole  40 mg Oral Daily  . rosuvastatin  40 mg Oral QHS    Vital Signs    Vitals:   02/03/19 1645 02/03/19 2013 02/04/19 0421 02/04/19 0741  BP:  (!) 105/55 (!) 101/51 (!) 119/59  Pulse: 86 88 96 90  Resp: (!) 24  20 20   Temp:  99 F (37.2 C) 99.5 F (37.5 C) 99.1 F (37.3 C)  TempSrc:  Oral Oral Oral  SpO2: 93% 91% 93% 90%  Weight:   62.4 kg   Height:        Intake/Output Summary (Last 24 hours) at 02/04/2019 0933 Last data filed at 02/04/2019 0424 Gross per 24 hour  Intake 240 ml  Output 1650 ml  Net -1410 ml   Filed Weights   02/03/19 0416 02/03/19 1638 02/04/19 0421  Weight: 64.1 kg 62.8 kg 62.4 kg    Physical Exam    GEN: Well nourished, well developed, in no acute distress.  HEENT: normal.  Neck: Supple, no JVD, carotid bruits, or masses. Cardiac: RRR, no murmurs, rubs, or gallops. No clubbing, cyanosis, edema.  Radials/DP/PT 2+ and equal bilaterally.  Respiratory:  Respirations regular and unlabored, clear to auscultation bilaterally. GI: Soft, nontender, nondistended, BS + x 4. MS: no  deformity or atrophy. Skin: warm and dry, no rash. Neuro:  Strength and sensation are intact. Psych: Normal affect.  Labs    CBC Recent Labs    02/03/19 0443 02/04/19 0403  WBC 9.5 8.8  HGB 11.3* 9.2*  HCT 36.2 29.1*  MCV 96.8 94.8  PLT 179 846   Basic Metabolic Panel Recent Labs    02/02/19 0943 02/03/19 0443  NA 141 141  K 4.1 3.6  CL 111 113*  CO2 20* 20*  GLUCOSE 158* 157*  BUN 16 14  CREATININE 1.79* 1.44*  CALCIUM 8.7* 8.4*   Liver Function Tests Recent Labs    02/02/19 0943  AST 42*  ALT 24  ALKPHOS 46  BILITOT 0.4  PROT 5.7*  ALBUMIN 3.1*   No results for input(s): LIPASE, AMYLASE in the last 72 hours. Cardiac Enzymes Recent Labs    02/02/19 0943  CKMB 17.0*  TROPONINI 0.67*   BNP Recent Labs    02/03/19 0443 02/04/19 0403  BNP 727.0* 305.0*   D-Dimer No results for input(s): DDIMER in the last 72 hours. Hemoglobin A1C No results for input(s): HGBA1C in the last 72 hours. Fasting Lipid Panel No results for input(s): CHOL,  HDL, LDLCALC, TRIG, CHOLHDL, LDLDIRECT in the last 72 hours. Thyroid Function Tests No results for input(s): TSH, T4TOTAL, T3FREE, THYROIDAB in the last 72 hours.  Invalid input(s): FREET3  Telemetry    Sinus rhythm  ECG    Sinus rhythm with no ischemia  Radiology    Dg Chest Port 1 View  Result Date: 02/04/2019 CLINICAL DATA:  Acute respiratory failure EXAM: PORTABLE CHEST 1 VIEW COMPARISON:  02/03/2019 FINDINGS: Normal heart size and stable mediastinal contours. Chronic hyperinflation with diaphragm flattening. There is a trace left pleural effusion. Resolved interstitial opacity. No air bronchogram or pneumothorax. IMPRESSION: Resolved pulmonary edema. Electronically Signed   By: Monte Fantasia M.D.   On: 02/04/2019 06:03   Dg Chest Port 1 View  Result Date: 02/03/2019 CLINICAL DATA:  Acute respiratory failure EXAM: PORTABLE CHEST 1 VIEW COMPARISON:  04/06/2016 FINDINGS: Diffuse interstitial opacity  with Awanda Mink lines. Mild cardiomegaly and trace pleural effusions. IMPRESSION: CHF. Electronically Signed   By: Monte Fantasia M.D.   On: 02/03/2019 04:26    Assessment & Plan    Patient with history of transient asystole likely occurring due to sick sinus syndrome after developing transient A. fib.  She was transfused for hemoglobin of 7.5.  Colonoscopy was suspended.  Would ambulate today and if hemoglobin remains stable would discharge from a cardiac standpoint.  After discharge if it occurs during business hours, would advise the patient to come to [facility] to have a 72-hour Holter monitor placed.  If not present as an outpatient in the next day or 2 for Holter  Signed, Chrissie Noa A. Skylene Deremer MD 02/04/2019, 9:33 AM  Pager: [phone]

## 2019-02-04 NOTE — Discharge Summary (Signed)
[facility] [place] NAME: Jocelyn Elliott    MR#:  [id number]  DATE OF BIRTH:  Aug 13, 1932  DATE OF ADMISSION:  02/02/2019 ADMITTING PHYSICIAN: Bettey Costa, MD  DATE OF DISCHARGE: 02/04/2019  PRIMARY CARE PHYSICIAN: Einar Pheasant, MD    ADMISSION DIAGNOSIS:  Bradycardia [R00.1]  DISCHARGE DIAGNOSIS:  Active Problems:   Bradycardia   Afib ([place])   SECONDARY DIAGNOSIS:   Past Medical History:  Diagnosis Date  . Arthritis   . AV malformation of gastrointestinal tract   . Blood in stool   . CAD (coronary artery disease)   . Carotid arterial disease ([place])   . Cataracts, bilateral   . Chronic blood loss anemia   . Chronic cystitis   . Depression   . Diabetes mellitus ([place])   . GERD (gastroesophageal reflux disease)   . Hyperlipidemia   . Hypertension   . IDA (iron deficiency anemia)   . Stress incontinence     HOSPITAL COURSE:  85 year old female with a history of CAD, hypertension and right carotid endarterectomy who was admitted after colonoscopy due to 8-second pause and atrial fibrillation.   1.  Atrial fibrillation followed by 8-second pause (complete heart block) x2: Patient has been in normal sinus rhythm Atrial fibrillation and 8-second pause related to metoprolol and propofol.   Metoprolol has been discontinued.  She will follow-up with cardiology today for a Holter monitor.    2.  Acute on chronic anemia status post 1 unit PRBC: Her hemoglobin has remained stable.  Patient can follow-up with GI as an outpatient.  3.  Hypotension: Patient's blood pressure has improved She will continue ACE inhibitor.  She is instructed to monitor her blood pressure every day in the morning at the same time and log this on for her PCP to make adjustments if needed.   4.  Acute on chronic kidney injury stage III: Creatinine has improved  5.  Diabetes: Patient may resume ADA diet   DISCHARGE CONDITIONS AND DIET:   Stable for  discharge on heart healthy diabetic diet  CONSULTS OBTAINED:  Treatment Team:  Teodoro Spray, MD  DRUG ALLERGIES:   Allergies  Allergen Reactions  . Ciprofloxacin   . Levaquin [Levofloxacin]   . Tequin [Gatifloxacin] Hives and Rash    DISCHARGE MEDICATIONS:   Allergies as of 02/04/2019      Reactions   Ciprofloxacin    Levaquin [levofloxacin]    Tequin [gatifloxacin] Hives, Rash      Medication List    STOP taking these medications   metoprolol succinate 100 MG 24 hr tablet Commonly known as:  TOPROL-XL     TAKE these medications   aspirin 81 MG tablet Take 81 mg by mouth daily.   Biotin 5000 MCG Caps Take 1 capsule by mouth daily.   cetirizine 10 MG tablet Commonly known as:  ZYRTEC Take 10 mg by mouth daily.   Fish Oil 1200 MG Caps Take 1 capsule by mouth daily.   omeprazole 20 MG capsule Commonly known as:  PRILOSEC Take 20 mg by mouth 2 (two) times daily before a meal.   ramipril 10 MG capsule Commonly known as:  ALTACE Take 1 capsule (10 mg total) by mouth daily.   rosuvastatin 40 MG tablet Commonly known as:  Crestor Take 1 tablet (40 mg total) by mouth daily.   traZODone 50 MG tablet Commonly known as:  DESYREL Take 0.5-1 tablets (25-50 mg total) by mouth at bedtime as  needed for sleep.         Today   CHIEF COMPLAINT:  Patient is doing well this morning.  Daughter is at bedside.  No acute events on telemetry.   VITAL SIGNS:  Blood pressure (!) 119/59, pulse 90, temperature 99.1 F (37.3 C), temperature source Oral, resp. rate 20, height 4\' 11"  (1.499 m), weight 62.4 kg, SpO2 90 %.   REVIEW OF SYSTEMS:  Review of Systems  Constitutional: Negative.  Negative for chills, fever and malaise/fatigue.  HENT: Negative.  Negative for ear discharge, ear pain, hearing loss, nosebleeds and sore throat.   Eyes: Negative.  Negative for blurred vision and pain.  Respiratory: Negative.  Negative for cough, hemoptysis, shortness of breath and  wheezing.   Cardiovascular: Negative.  Negative for chest pain, palpitations and leg swelling.  Gastrointestinal: Negative.  Negative for abdominal pain, blood in stool, diarrhea, nausea and vomiting.  Genitourinary: Negative.  Negative for dysuria.  Musculoskeletal: Negative.  Negative for back pain.  Skin: Negative.   Neurological: Negative for dizziness, tremors, speech change, focal weakness, seizures and headaches.  Endo/Heme/Allergies: Negative.  Does not bruise/bleed easily.  Psychiatric/Behavioral: Negative.  Negative for depression, hallucinations and suicidal ideas.     PHYSICAL EXAMINATION:  GENERAL:  85 y.o.-year-old patient lying in the bed with no acute distress.  NECK:  Supple, no jugular venous distention. No thyroid enlargement, no tenderness.  LUNGS: Normal breath sounds bilaterally, no wheezing, rales,rhonchi  No use of accessory muscles of respiration.  CARDIOVASCULAR: S1, S2 normal. No murmurs, rubs, or gallops.  ABDOMEN: Soft, non-tender, non-distended. Bowel sounds present. No organomegaly or mass.  EXTREMITIES: No pedal edema, cyanosis, or clubbing.  PSYCHIATRIC: The patient is alert and oriented x 3.  SKIN: No obvious rash, lesion, or ulcer.   DATA REVIEW:   CBC Recent Labs  Lab 02/04/19 0403  WBC 8.8  HGB 9.2*  HCT 29.1*  PLT 195    Chemistries  Recent Labs  Lab 02/02/19 0943 02/03/19 0443  NA 141 141  K 4.1 3.6  CL 111 113*  CO2 20* 20*  GLUCOSE 158* 157*  BUN 16 14  CREATININE 1.79* 1.44*  CALCIUM 8.7* 8.4*  AST 42*  --   ALT 24  --   ALKPHOS 46  --   BILITOT 0.4  --     Cardiac Enzymes Recent Labs  Lab 02/02/19 0943  TROPONINI 0.67*    Microbiology Results  @MICRORSLT48 @  RADIOLOGY:  Dg Chest Port 1 View  Result Date: 02/04/2019 CLINICAL DATA:  Acute respiratory failure EXAM: PORTABLE CHEST 1 VIEW COMPARISON:  02/03/2019 FINDINGS: Normal heart size and stable mediastinal contours. Chronic hyperinflation with diaphragm  flattening. There is a trace left pleural effusion. Resolved interstitial opacity. No air bronchogram or pneumothorax. IMPRESSION: Resolved pulmonary edema. Electronically Signed   By: Monte Fantasia M.D.   On: 02/04/2019 06:03   Dg Chest Port 1 View  Result Date: 02/03/2019 CLINICAL DATA:  Acute respiratory failure EXAM: PORTABLE CHEST 1 VIEW COMPARISON:  04/06/2016 FINDINGS: Diffuse interstitial opacity with Awanda Mink lines. Mild cardiomegaly and trace pleural effusions. IMPRESSION: CHF. Electronically Signed   By: Monte Fantasia M.D.   On: 02/03/2019 04:26      Allergies as of 02/04/2019      Reactions   Ciprofloxacin    Levaquin [levofloxacin]    Tequin [gatifloxacin] Hives, Rash      Medication List    STOP taking these medications   metoprolol succinate 100 MG 24 hr tablet Commonly  known as:  TOPROL-XL     TAKE these medications   aspirin 81 MG tablet Take 81 mg by mouth daily.   Biotin 5000 MCG Caps Take 1 capsule by mouth daily.   cetirizine 10 MG tablet Commonly known as:  ZYRTEC Take 10 mg by mouth daily.   Fish Oil 1200 MG Caps Take 1 capsule by mouth daily.   omeprazole 20 MG capsule Commonly known as:  PRILOSEC Take 20 mg by mouth 2 (two) times daily before a meal.   ramipril 10 MG capsule Commonly known as:  ALTACE Take 1 capsule (10 mg total) by mouth daily.   rosuvastatin 40 MG tablet Commonly known as:  Crestor Take 1 tablet (40 mg total) by mouth daily.   traZODone 50 MG tablet Commonly known as:  DESYREL Take 0.5-1 tablets (25-50 mg total) by mouth at bedtime as needed for sleep.          Management plans discussed with the patient and she is in agreement. Stable for discharge home with [facility]  Patient should follow up with cardiology  CODE STATUS:     Code Status Orders  (From admission, onward)         Start     Ordered   02/02/19 1046  Full code  Continuous     02/02/19 1048        Code Status History    This patient has a  current code status but no historical code status.    Advance Directive Documentation     Most Recent Value  Type of Advance Directive  Living will  Pre-existing out of facility DNR order (yellow form or pink MOST form)  -  "MOST" Form in Place?  -      TOTAL TIME TAKING CARE OF THIS PATIENT: 38 minutes.    Note: This dictation was prepared with Dragon dictation along with smaller phrase technology. Any transcriptional errors that result from this process are unintentional.  Bettey Costa M.D on 02/04/2019 at 8:59 AM  Between 7am to 6pm - Pager - [phone] After 6pm go to [email or web address] - password [email or web address]  Office  [phone]  CC: Primary care physician; Einar Pheasant, MD

## 2019-02-04 NOTE — Progress Notes (Signed)
Pt family member states she found multiple ants on the window sill. This nurse spoke to family and pt about moving to a different room but family declined to move at this time. Will continue to monitor.

## 2019-02-04 NOTE — Progress Notes (Signed)
Patient discharged to home. Tele and IV d/c'd. Patient verbalizes understanding of discharge instructions. 

## 2019-02-04 NOTE — TOC Transition Note (Signed)
Transition of Care [facility]) - CM/SW Discharge Note   Patient Details  Name: Jocelyn Elliott MRN: [id number] Date of Birth: [birth year]  [facility] CM/SW Contact:  Katrina Stack, RN Phone Number: 02/04/2019, 12:26 PM   Clinical Narrative:    For discharge home today.  Daughter will transports home.  Referral to [facility] for RN PT Aide.    Final next level of care: [place] Barriers to Discharge: No Barriers Identified   Patient Goals and CMS Choice Patient states their goals for this hospitalization and ongoing recovery are:: Find out what is going on causing me to feel so bad  CMS Medicare.gov Compare Post Acute Care list provided to:: Patient Choice offered to / list presented to : Patient, Adult Children  Discharge Placement                       Discharge Plan and Services Discharge Planning Services: CM Consult Post Acute Care Choice: Home Health                    Social Determinants of Health (SDOH) Interventions     Readmission Risk Interventions No flowsheet data found.

## 2019-02-05 ENCOUNTER — Telehealth: Payer: Self-pay

## 2019-02-05 LAB — TYPE AND SCREEN
ABO/RH(D): A POS
Antibody Screen: POSITIVE
Donor AG Type: NEGATIVE
PT AG Type: POSITIVE
Unit division: 0
Unit division: 0
Unit division: 0

## 2019-02-05 LAB — BPAM RBC
BLOOD PRODUCT EXPIRATION DATE: 202004122359
Blood Product Expiration Date: 202003222359
Blood Product Expiration Date: 202004122359
ISSUE DATE / TIME: 202003161713
Unit Type and Rh: 5100
Unit Type and Rh: 6200
Unit Type and Rh: 6200

## 2019-02-05 NOTE — Telephone Encounter (Signed)
Transition Care Management Follow-up Telephone Call   Date discharged? 02/04/19   How have you been since you were released from the hospital? Patient states, "I feel little weak but I am feeling better."  Monitoring fluctuating blood pressure via wrist averaging high 136/55, P54 (yesterday) and 109/51-117/66, P93 (today). Drinking plenty of water. Denies chest pain, dizziness, shortness of breath, headache. Food doesn't seem to taste as good.  Fasting blood sugars 134 today. Bowels and bladder are moving appropriately.    Do you understand why you were in the hospital? Yes   Do you understand the discharge instructions? Yes, increase activity as tolerated, drink plenty of water. Follow up with cardiology.    Where were you discharged to? Home.    Items Reviewed:  Medications reviewed: Yes, she is taking scheduled medications as directed at discharge.  Allergies reviewed: Yes, no changes  Dietary changes reviewed: Yes, heart healthy ADA diet  Referrals reviewed:Yes. Appointment scheduled with cardiology.    Functional Questionnaire:   Activities of Daily Living (ADLs):   She states they are independent in the following: Independent in all ADLs.  States they require assistance with the following: Ambulates with walker or cane as needed.    Any transportation issues/concerns?: None. Son in law will drive her to appointments.    Any patient concerns? None at this time.   Confirmed importance and date/time of follow-up visits scheduled Yes, scheduled 02/12/19 at 1030.  Provider Appointment booked with Dr. Nicki Reaper, pcp.   Confirmed with patient if condition begins to worsen call PCP or go to the ER.  Patient was given the office number and encouraged to call back with question or concerns.  : Yes

## 2019-02-12 ENCOUNTER — Encounter: Payer: Self-pay | Admitting: Internal Medicine

## 2019-02-12 ENCOUNTER — Other Ambulatory Visit: Payer: Self-pay

## 2019-02-12 ENCOUNTER — Ambulatory Visit: Payer: Medicare Other | Admitting: Internal Medicine

## 2019-02-12 VITALS — BP 126/68 | HR 98 | Temp 98.0°F | Resp 16 | Wt 138.6 lb

## 2019-02-12 DIAGNOSIS — D649 Anemia, unspecified: Secondary | ICD-10-CM | POA: Diagnosis not present

## 2019-02-12 DIAGNOSIS — I739 Peripheral vascular disease, unspecified: Secondary | ICD-10-CM

## 2019-02-12 DIAGNOSIS — I4891 Unspecified atrial fibrillation: Secondary | ICD-10-CM | POA: Diagnosis not present

## 2019-02-12 DIAGNOSIS — D500 Iron deficiency anemia secondary to blood loss (chronic): Secondary | ICD-10-CM

## 2019-02-12 DIAGNOSIS — I779 Disorder of arteries and arterioles, unspecified: Secondary | ICD-10-CM

## 2019-02-12 DIAGNOSIS — E119 Type 2 diabetes mellitus without complications: Secondary | ICD-10-CM

## 2019-02-12 DIAGNOSIS — N183 Chronic kidney disease, stage 3 unspecified: Secondary | ICD-10-CM

## 2019-02-12 DIAGNOSIS — I10 Essential (primary) hypertension: Secondary | ICD-10-CM

## 2019-02-12 LAB — BASIC METABOLIC PANEL
BUN: 22 mg/dL (ref 6–23)
CO2: 25 mEq/L (ref 19–32)
Calcium: 9.1 mg/dL (ref 8.4–10.5)
Chloride: 104 mEq/L (ref 96–112)
Creatinine, Ser: 1.52 mg/dL — ABNORMAL HIGH (ref 0.40–1.20)
GFR: 32.53 mL/min — ABNORMAL LOW (ref >60.00)
Glucose, Bld: 196 mg/dL — ABNORMAL HIGH (ref 70–99)
Potassium: 3.8 mEq/L (ref 3.5–5.1)
Sodium: 139 mEq/L (ref 135–145)

## 2019-02-12 LAB — CBC WITH DIFFERENTIAL/PLATELET
Basophils Absolute: 0.1 10*3/uL (ref 0.0–0.1)
Basophils Relative: 1 % (ref 0.0–3.0)
EOS PCT: 3.8 % (ref 0.0–5.0)
Eosinophils Absolute: 0.3 10*3/uL (ref 0.0–0.7)
HCT: 29.1 % — ABNORMAL LOW (ref 36.0–46.0)
Hemoglobin: 9.8 g/dL — ABNORMAL LOW (ref 12.0–15.0)
Lymphocytes Relative: 24.5 % (ref 12.0–46.0)
Lymphs Abs: 2 10*3/uL (ref 0.7–4.0)
MCHC: 33.7 g/dL (ref 30.0–36.0)
MCV: 90.4 fl (ref 78.0–100.0)
MONOS PCT: 7.1 % (ref 3.0–12.0)
Monocytes Absolute: 0.6 10*3/uL (ref 0.1–1.0)
Neutro Abs: 5.2 10*3/uL (ref 1.4–7.7)
Neutrophils Relative %: 63.6 % (ref 43.0–77.0)
Platelets: 272 10*3/uL (ref 150.0–400.0)
RBC: 3.22 Mil/uL — AB (ref 3.87–5.11)
RDW: 15 % (ref 11.5–15.5)
WBC: 8.2 10*3/uL (ref 4.0–10.5)

## 2019-02-12 LAB — FERRITIN: Ferritin: 12.6 ng/mL (ref 10.0–291.0)

## 2019-02-12 MED ORDER — TRAZODONE HCL 50 MG PO TABS: 25.0000 mg | ORAL_TABLET | Freq: Every evening | ORAL | 0 refills | Status: DC | PRN

## 2019-02-12 NOTE — Progress Notes (Signed)
Patient ID: Jocelyn Elliott, female   DOB: 06/06/1932, 85 y.o.   MRN: [id number]   Subjective:    Patient ID: Jocelyn Elliott, female    DOB: 01/23/1932, 85 y.o.   MRN: [id number]  HPI  Patient here for hospital follow up.  Was admitted 02/02/19 who was admitted after colonoscopy due to 8 second pause and atrial fib.  Felt to be related to metoprolol and propofol.  Metoprolol was stopped.  Was monitored in hospital.  Noted to have decreased hgb.  Was transfused.  Discharged 02/04/19.  hgb on discharge 9.2.  Since her discharge, she has felt better.  No chest pain.  Breathing stable.  No acid reflux.  No abdominal pain.  Eating.  Stool is black.  States this is unchanged.  Energy is better.  Has f/u planned with Dr Saralyn Pilar 02/16/19.  Per hospital discharge, f/u holter.  Since her discharge, she was having issues with decreased blood pressure.  Ramipril was decreased to 2.'5mg'$  q day.  Blood pressure doing better now.  No headache.  No dizziness or light headedness.     Past Medical History:  Diagnosis Date  . Arthritis   . AV malformation of gastrointestinal tract   . Blood in stool   . CAD (coronary artery disease)   . Carotid arterial disease ([place])   . Cataracts, bilateral   . Chronic blood loss anemia   . Chronic cystitis   . Depression   . Diabetes mellitus ([place])   . GERD (gastroesophageal reflux disease)   . Hyperlipidemia   . Hypertension   . IDA (iron deficiency anemia)   . Stress incontinence    Past Surgical History:  Procedure Laterality Date  . ABDOMINAL HYSTERECTOMY  1972   ovaries left in place  . APPENDECTOMY  1992  . BACK SURGERY  06/08/2010   L3, L4, L5  . CAROTID ARTERY ANGIOPLASTY  11/03  . [place], 89   right then left   . CHOLECYSTECTOMY  1993  . COLONOSCOPY WITH PROPOFOL N/A 02/02/2019   Procedure: COLONOSCOPY WITH PROPOFOL;  Surgeon: Manya Silvas, MD;  Location: [facility] ENDOSCOPY;  Service: Endoscopy;  Laterality: N/A;  .  ESOPHAGOGASTRODUODENOSCOPY N/A 02/02/2019   Procedure: ESOPHAGOGASTRODUODENOSCOPY (EGD);  Surgeon: Manya Silvas, MD;  Location: [facility] ENDOSCOPY;  Service: Endoscopy;  Laterality: N/A;  . FOOT SURGERY  06/2007   Left  . right carotid artery surgery  01/09/13   Dr. Delana Meyer @ [facility]  . TOTAL HIP ARTHROPLASTY  05/03/2011   left 12, right 11/07   Family History  Problem Relation Age of Onset  . Hyperlipidemia Father   . Heart disease Father        myocardial infarction  . Hypertension Father        Parent  . Arthritis Other        parent  . Diabetes Other        nephew  . Cervical cancer Sister   . Rectal cancer Sister   . Breast cancer Neg Hx    Social History   Socioeconomic History  . Marital status: Widowed    Spouse name: Not on file  . Number of children: 1  . Years of education: 67  . Highest education level: Not on file  Occupational History  . Occupation: Retired  Scientific laboratory technician  . Financial resource strain: Not on file  . Food insecurity:    Worry: Not on file    Inability: Not on file  .  Transportation needs:    Medical: Not on file    Non-medical: Not on file  Tobacco Use  . Smoking status: Former Smoker    Types: Cigarettes    Last attempt to quit: 11/19/1989    Years since quitting: 29.2  . Smokeless tobacco: Never Used  Substance and Sexual Activity  . Alcohol use: No    Alcohol/week: 0.0 standard drinks  . Drug use: No  . Sexual activity: Not on file  Lifestyle  . Physical activity:    Days per week: Not on file    Minutes per session: Not on file  . Stress: Not on file  Relationships  . Social connections:    Talks on phone: Not on file    Gets together: Not on file    Attends religious service: Not on file    Active member of club or organization: Not on file    Attends meetings of clubs or organizations: Not on file    Relationship status: Not on file  Other Topics Concern  . Not on file  Social History Narrative   Regular exercise-no    Caffeine Use-yes    Outpatient Encounter Medications as of 02/12/2019  Medication Sig  . aspirin 81 MG tablet Take 81 mg by mouth daily.  . Biotin 5000 MCG CAPS Take 1 capsule by mouth daily.  . cetirizine (ZYRTEC) 10 MG tablet Take 10 mg by mouth daily.  . Omega-3 Fatty Acids (FISH OIL) 1200 MG CAPS Take 1 capsule by mouth daily.  Marland Kitchen omeprazole (PRILOSEC) 20 MG capsule Take 20 mg by mouth 2 (two) times daily before a meal.   . ramipril (ALTACE) 5 MG capsule Take 1 capsule by mouth daily.  . rosuvastatin (CRESTOR) 40 MG tablet Take 1 tablet (40 mg total) by mouth daily.  . traZODone (DESYREL) 50 MG tablet Take 0.5-1 tablets (25-50 mg total) by mouth at bedtime as needed for sleep.  . [DISCONTINUED] ramipril (ALTACE) 10 MG capsule Take 1 capsule (10 mg total) by mouth daily.  . [DISCONTINUED] traZODone (DESYREL) 50 MG tablet Take 0.5-1 tablets (25-50 mg total) by mouth at bedtime as needed for sleep.   No facility-administered encounter medications on file as of 02/12/2019.     Review of Systems  Constitutional: Negative for appetite change and unexpected weight change.  HENT: Negative for congestion and sinus pressure.   Respiratory: Negative for cough, chest tightness and shortness of breath.   Cardiovascular: Negative for chest pain, palpitations and leg swelling.  Gastrointestinal: Negative for abdominal pain, diarrhea, nausea and vomiting.  Genitourinary: Negative for difficulty urinating and dysuria.  Musculoskeletal: Negative for joint swelling and myalgias.  Skin: Negative for color change and rash.  Neurological: Negative for dizziness, light-headedness and headaches.  Psychiatric/Behavioral: Negative for agitation and dysphoric mood.       Objective:    Physical Exam Constitutional:      General: She is not in acute distress.    Appearance: Normal appearance.  HENT:     Nose: Nose normal. No congestion.     Mouth/Throat:     Pharynx: No oropharyngeal exudate or  posterior oropharyngeal erythema.  Neck:     Musculoskeletal: Neck supple. No muscular tenderness.     Thyroid: No thyromegaly.  Cardiovascular:     Rate and Rhythm: Normal rate and regular rhythm.  Pulmonary:     Effort: No respiratory distress.     Breath sounds: Normal breath sounds. No wheezing.  Abdominal:     General:  Bowel sounds are normal.     Palpations: Abdomen is soft.     Tenderness: There is no abdominal tenderness.  Musculoskeletal:        General: No swelling or tenderness.  Lymphadenopathy:     Cervical: No cervical adenopathy.  Skin:    Findings: No erythema or rash.  Neurological:     Mental Status: She is alert.  Psychiatric:        Mood and Affect: Mood normal.        Behavior: Behavior normal.     BP 126/68   Pulse 98   Temp 98 F (36.7 C) (Oral)   Resp 16   Wt 138 lb 9.6 oz (62.9 kg)   SpO2 97%   BMI 27.99 kg/m  Wt Readings from Last 3 Encounters:  02/12/19 138 lb 9.6 oz (62.9 kg)  02/04/19 137 lb 8 oz (62.4 kg)  12/03/18 139 lb 3.2 oz (63.1 kg)     Lab Results  Component Value Date   WBC 8.2 02/12/2019   HGB 9.8 (L) 02/12/2019   HCT 29.1 (L) 02/12/2019   PLT 272.0 02/12/2019   GLUCOSE 196 (H) 02/12/2019   CHOL 127 10/13/2018   TRIG 166.0 (H) 10/13/2018   HDL 47.00 10/13/2018   LDLDIRECT 104.0 06/09/2018   LDLCALC 47 10/13/2018   ALT 24 02/02/2019   AST 42 (H) 02/02/2019   NA 139 02/12/2019   K 3.8 02/12/2019   CL 104 02/12/2019   CREATININE 1.52 (H) 02/12/2019   BUN 22 02/12/2019   CO2 25 02/12/2019   TSH 1.60 10/13/2018   INR 1.0 01/10/2013   HGBA1C 6.2 10/13/2018   MICROALBUR 9.3 (H) 10/13/2018    Dg Chest Port 1 View  Result Date: 02/03/2019 CLINICAL DATA:  Acute respiratory failure EXAM: PORTABLE CHEST 1 VIEW COMPARISON:  04/06/2016 FINDINGS: Diffuse interstitial opacity with Kerley lines. Mild cardiomegaly and trace pleural effusions. IMPRESSION: CHF. Electronically Signed   By: Monte Fantasia M.D.   On: 02/03/2019  04:26       Assessment & Plan:   Problem List Items Addressed This Visit    Afib [facility])    After hospital event and short period of afib, was documented to be in SR.  Has f/u planned with cardiology next week.  Appears to be in SR today.        Relevant Medications   ramipril (ALTACE) 5 MG capsule   Anemia - Primary    Has been followed by hematology.  Has previously been receiving iron infusions.  Was recently hospitalized.  hgb decreased.  Transfused while in hospital.  Discharge hgb 9.2.  Recheck cbc today.        Relevant Orders   CBC with Differential/Platelet (Completed)   Ferritin (Completed)   Carotid arterial disease (HCC)    Followed by [facility].       Relevant Medications   ramipril (ALTACE) 5 MG capsule   CKD (chronic kidney disease) stage 3, GFR 30-59 ml/min (HCC)    Creatinine - 1.79 in hospital.  Improved with transfusion, etc.  Recheck metabolic panel today.        Diabetes mellitus ([place])    Low carb diet and exercise.  Follow met b and a1c.        Relevant Medications   ramipril (ALTACE) 5 MG capsule   Hypertension    Blood pressure doing better on lower dose of ramipril.  Continue current medication regimen.  Follow pressures.  Follow metabolic panel.  Relevant Medications   ramipril (ALTACE) 5 MG capsule   Other Relevant Orders   Basic metabolic panel (Completed)   Iron deficiency anemia due to chronic blood loss    Has been followed by iron infusions.  Recent hgb decreased with admission - 7.5.  Transfused while in the hospital.  Discharge hgb 9.2.  Recheck cbc today.  Discussed f/u with hematology.           Einar Pheasant, MD

## 2019-02-13 ENCOUNTER — Telehealth: Payer: Self-pay | Admitting: Internal Medicine

## 2019-02-13 NOTE — Telephone Encounter (Signed)
See result note.  

## 2019-02-13 NOTE — Telephone Encounter (Signed)
Copied from [place] [phone]. Topic: Quick Communication - See Telephone Encounter >> Feb 13, 2019  3:45 PM Sheran Luz wrote: CRM for notification. See Telephone encounter for: 02/13/19.  Patient calling to check status of results from 3/27. Please advise.

## 2019-02-14 ENCOUNTER — Encounter: Payer: Self-pay | Admitting: Internal Medicine

## 2019-02-14 NOTE — Assessment & Plan Note (Signed)
After hospital event and short period of afib, was documented to be in SR.  Has f/u planned with cardiology next week.  Appears to be in SR today.

## 2019-02-14 NOTE — Assessment & Plan Note (Signed)
Followed by [facility].   

## 2019-02-14 NOTE — Assessment & Plan Note (Signed)
Low carb diet and exercise.  Follow met b and a1c.   

## 2019-02-14 NOTE — Assessment & Plan Note (Signed)
Creatinine - 1.79 in hospital.  Improved with transfusion, etc.  Recheck metabolic panel today.

## 2019-02-14 NOTE — Assessment & Plan Note (Signed)
Blood pressure doing better on lower dose of ramipril.  Continue current medication regimen.  Follow pressures.  Follow metabolic panel.

## 2019-02-14 NOTE — Assessment & Plan Note (Signed)
Has been followed by iron infusions.  Recent hgb decreased with admission - 7.5.  Transfused while in the hospital.  Discharge hgb 9.2.  Recheck cbc today.  Discussed f/u with hematology.

## 2019-02-14 NOTE — Assessment & Plan Note (Signed)
Has been followed by hematology.  Has previously been receiving iron infusions.  Was recently hospitalized.  hgb decreased.  Transfused while in hospital.  Discharge hgb 9.2.  Recheck cbc today.

## 2019-02-16 ENCOUNTER — Telehealth: Payer: Self-pay | Admitting: Internal Medicine

## 2019-02-16 DIAGNOSIS — D500 Iron deficiency anemia secondary to blood loss (chronic): Secondary | ICD-10-CM

## 2019-02-16 NOTE — Addendum Note (Signed)
Addended by: Sandria Bales B on: 02/16/2019 08:55 AM   Modules accepted: Orders

## 2019-02-16 NOTE — Telephone Encounter (Signed)
Colette- please have the patient come sometime this week-for the labs/and IV Feraheme  Please order the following labs-CBC/hold tube/multiple myeloma panel kappa lambda light chain ratio/LDH haptoglobin/BMP.   Schedule tele-visit 1 to 2 days after the labs are drawn.  Thanks GB  Thanks Dr.Scott. FYI-

## 2019-02-18 ENCOUNTER — Other Ambulatory Visit: Payer: Self-pay

## 2019-02-19 ENCOUNTER — Other Ambulatory Visit: Payer: Self-pay

## 2019-02-19 ENCOUNTER — Inpatient Hospital Stay: Payer: Medicare Other

## 2019-02-19 ENCOUNTER — Inpatient Hospital Stay: Payer: Medicare Other | Attending: Internal Medicine

## 2019-02-19 VITALS — BP 110/68 | HR 75 | Temp 97.2°F | Resp 18

## 2019-02-19 DIAGNOSIS — D631 Anemia in chronic kidney disease: Secondary | ICD-10-CM | POA: Diagnosis present

## 2019-02-19 DIAGNOSIS — N183 Chronic kidney disease, stage 3 (moderate): Secondary | ICD-10-CM | POA: Insufficient documentation

## 2019-02-19 DIAGNOSIS — D500 Iron deficiency anemia secondary to blood loss (chronic): Secondary | ICD-10-CM

## 2019-02-19 DIAGNOSIS — E611 Iron deficiency: Secondary | ICD-10-CM | POA: Insufficient documentation

## 2019-02-19 DIAGNOSIS — D508 Other iron deficiency anemias: Secondary | ICD-10-CM

## 2019-02-19 LAB — CBC WITH DIFFERENTIAL/PLATELET
Abs Immature Granulocytes: 0.03 10*3/uL (ref 0.00–0.07)
Basophils Absolute: 0.1 10*3/uL (ref 0.0–0.1)
Basophils Relative: 1 %
Eosinophils Absolute: 0.2 10*3/uL (ref 0.0–0.5)
Eosinophils Relative: 3 %
HCT: 27.8 % — ABNORMAL LOW (ref 36.0–46.0)
Hemoglobin: 8.8 g/dL — ABNORMAL LOW (ref 12.0–15.0)
Immature Granulocytes: 0 %
Lymphocytes Relative: 24 %
Lymphs Abs: 1.7 10*3/uL (ref 0.7–4.0)
MCH: 29.1 pg (ref 26.0–34.0)
MCHC: 31.7 g/dL (ref 30.0–36.0)
MCV: 92.1 fL (ref 80.0–100.0)
Monocytes Absolute: 0.4 10*3/uL (ref 0.1–1.0)
Monocytes Relative: 5 %
Neutro Abs: 4.9 10*3/uL (ref 1.7–7.7)
Neutrophils Relative %: 67 %
Platelets: 324 10*3/uL (ref 150–400)
RBC: 3.02 MIL/uL — ABNORMAL LOW (ref 3.87–5.11)
RDW: 14.6 % (ref 11.5–15.5)
WBC: 7.4 10*3/uL (ref 4.0–10.5)
nRBC: 0 % (ref 0.0–0.2)

## 2019-02-19 LAB — BASIC METABOLIC PANEL
Anion gap: 7 (ref 5–15)
BUN: 19 mg/dL (ref 8–23)
CO2: 21 mmol/L — ABNORMAL LOW (ref 22–32)
Calcium: 8.7 mg/dL — ABNORMAL LOW (ref 8.9–10.3)
Chloride: 108 mmol/L (ref 98–111)
Creatinine, Ser: 1.64 mg/dL — ABNORMAL HIGH (ref 0.44–1.00)
GFR calc Af Amer: 33 mL/min — ABNORMAL LOW (ref >60)
GFR calc non Af Amer: 28 mL/min — ABNORMAL LOW (ref >60)
Glucose, Bld: 198 mg/dL — ABNORMAL HIGH (ref 70–99)
Potassium: 3.6 mmol/L (ref 3.5–5.1)
Sodium: 136 mmol/L (ref 135–145)

## 2019-02-19 LAB — LACTATE DEHYDROGENASE: LDH: 145 U/L (ref 98–192)

## 2019-02-19 LAB — SAMPLE TO BLOOD BANK

## 2019-02-19 MED ORDER — SODIUM CHLORIDE 0.9 % IV SOLN
510.0000 mg | Freq: Once | INTRAVENOUS | Status: AC
  Administered 2019-02-19: 12:00:00 510 mg via INTRAVENOUS
  Filled 2019-02-19: qty 17

## 2019-02-19 MED ORDER — SODIUM CHLORIDE 0.9 % IV SOLN
Freq: Once | INTRAVENOUS | Status: AC
  Administered 2019-02-19: 12:00:00 via INTRAVENOUS
  Filled 2019-02-19: qty 250

## 2019-02-20 LAB — MULTIPLE MYELOMA PANEL, SERUM
Albumin SerPl Elph-Mcnc: 3.4 g/dL (ref 2.9–4.4)
Albumin/Glob SerPl: 1.2 (ref 0.7–1.7)
Alpha 1: 0.3 g/dL (ref 0.0–0.4)
Alpha2 Glob SerPl Elph-Mcnc: 1.1 g/dL — ABNORMAL HIGH (ref 0.4–1.0)
B-Globulin SerPl Elph-Mcnc: 1.1 g/dL (ref 0.7–1.3)
Gamma Glob SerPl Elph-Mcnc: 0.5 g/dL (ref 0.4–1.8)
Globulin, Total: 3 g/dL (ref 2.2–3.9)
IgA: 273 mg/dL (ref 64–422)
IgG (Immunoglobin G), Serum: 765 mg/dL (ref 586–1602)
IgM (Immunoglobulin M), Srm: 53 mg/dL (ref 26–217)
Total Protein ELP: 6.4 g/dL (ref 6.0–8.5)

## 2019-02-20 LAB — KAPPA/LAMBDA LIGHT CHAINS
Kappa free light chain: 46 mg/L — ABNORMAL HIGH (ref 3.3–19.4)
Kappa, lambda light chain ratio: 1.75 — ABNORMAL HIGH (ref 0.26–1.65)
Lambda free light chains: 26.3 mg/L (ref 5.7–26.3)

## 2019-02-20 LAB — HAPTOGLOBIN: Haptoglobin: 285 mg/dL (ref 41–333)

## 2019-02-23 ENCOUNTER — Other Ambulatory Visit: Payer: Medicare Other

## 2019-02-25 ENCOUNTER — Ambulatory Visit: Payer: Medicare Other | Admitting: Internal Medicine

## 2019-03-31 ENCOUNTER — Other Ambulatory Visit: Payer: Self-pay | Admitting: *Deleted

## 2019-03-31 DIAGNOSIS — D500 Iron deficiency anemia secondary to blood loss (chronic): Secondary | ICD-10-CM

## 2019-04-01 ENCOUNTER — Other Ambulatory Visit: Payer: Self-pay

## 2019-04-01 ENCOUNTER — Inpatient Hospital Stay: Payer: Medicare Other

## 2019-04-01 ENCOUNTER — Inpatient Hospital Stay: Payer: Medicare Other | Attending: Internal Medicine | Admitting: Internal Medicine

## 2019-04-01 VITALS — BP 101/54 | HR 79 | Temp 96.0°F | Resp 20

## 2019-04-01 VITALS — BP 126/51 | HR 72 | Temp 96.3°F | Resp 18 | Ht 59.0 in | Wt 137.0 lb

## 2019-04-01 DIAGNOSIS — I129 Hypertensive chronic kidney disease with stage 1 through stage 4 chronic kidney disease, or unspecified chronic kidney disease: Secondary | ICD-10-CM | POA: Diagnosis not present

## 2019-04-01 DIAGNOSIS — Z8261 Family history of arthritis: Secondary | ICD-10-CM | POA: Diagnosis not present

## 2019-04-01 DIAGNOSIS — Z8249 Family history of ischemic heart disease and other diseases of the circulatory system: Secondary | ICD-10-CM | POA: Diagnosis not present

## 2019-04-01 DIAGNOSIS — D631 Anemia in chronic kidney disease: Secondary | ICD-10-CM | POA: Insufficient documentation

## 2019-04-01 DIAGNOSIS — E1122 Type 2 diabetes mellitus with diabetic chronic kidney disease: Secondary | ICD-10-CM

## 2019-04-01 DIAGNOSIS — R0602 Shortness of breath: Secondary | ICD-10-CM | POA: Insufficient documentation

## 2019-04-01 DIAGNOSIS — I2510 Atherosclerotic heart disease of native coronary artery without angina pectoris: Secondary | ICD-10-CM | POA: Insufficient documentation

## 2019-04-01 DIAGNOSIS — N183 Chronic kidney disease, stage 3 (moderate): Secondary | ICD-10-CM | POA: Insufficient documentation

## 2019-04-01 DIAGNOSIS — R0789 Other chest pain: Secondary | ICD-10-CM | POA: Insufficient documentation

## 2019-04-01 DIAGNOSIS — Z833 Family history of diabetes mellitus: Secondary | ICD-10-CM | POA: Diagnosis not present

## 2019-04-01 DIAGNOSIS — Z881 Allergy status to other antibiotic agents status: Secondary | ICD-10-CM | POA: Diagnosis not present

## 2019-04-01 DIAGNOSIS — Z8349 Family history of other endocrine, nutritional and metabolic diseases: Secondary | ICD-10-CM | POA: Diagnosis not present

## 2019-04-01 DIAGNOSIS — Z79899 Other long term (current) drug therapy: Secondary | ICD-10-CM | POA: Diagnosis not present

## 2019-04-01 DIAGNOSIS — M1990 Unspecified osteoarthritis, unspecified site: Secondary | ICD-10-CM | POA: Insufficient documentation

## 2019-04-01 DIAGNOSIS — D508 Other iron deficiency anemias: Secondary | ICD-10-CM

## 2019-04-01 DIAGNOSIS — D500 Iron deficiency anemia secondary to blood loss (chronic): Secondary | ICD-10-CM

## 2019-04-01 DIAGNOSIS — Z87891 Personal history of nicotine dependence: Secondary | ICD-10-CM

## 2019-04-01 DIAGNOSIS — E611 Iron deficiency: Secondary | ICD-10-CM | POA: Insufficient documentation

## 2019-04-01 LAB — CBC WITH DIFFERENTIAL/PLATELET
Abs Immature Granulocytes: 0.01 10*3/uL (ref 0.00–0.07)
Basophils Absolute: 0.1 10*3/uL (ref 0.0–0.1)
Basophils Relative: 1 %
Eosinophils Absolute: 0.2 10*3/uL (ref 0.0–0.5)
Eosinophils Relative: 2 %
HCT: 23.8 % — ABNORMAL LOW (ref 36.0–46.0)
Hemoglobin: 7.3 g/dL — ABNORMAL LOW (ref 12.0–15.0)
Immature Granulocytes: 0 %
Lymphocytes Relative: 26 %
Lymphs Abs: 1.9 10*3/uL (ref 0.7–4.0)
MCH: 29.2 pg (ref 26.0–34.0)
MCHC: 30.7 g/dL (ref 30.0–36.0)
MCV: 95.2 fL (ref 80.0–100.0)
Monocytes Absolute: 0.4 10*3/uL (ref 0.1–1.0)
Monocytes Relative: 6 %
Neutro Abs: 4.9 10*3/uL (ref 1.7–7.7)
Neutrophils Relative %: 65 %
Platelets: 239 10*3/uL (ref 150–400)
RBC: 2.5 MIL/uL — ABNORMAL LOW (ref 3.87–5.11)
RDW: 16.6 % — ABNORMAL HIGH (ref 11.5–15.5)
WBC: 7.4 10*3/uL (ref 4.0–10.5)
nRBC: 0 % (ref 0.0–0.2)

## 2019-04-01 LAB — BASIC METABOLIC PANEL
Anion gap: 7 (ref 5–15)
BUN: 27 mg/dL — ABNORMAL HIGH (ref 8–23)
CO2: 21 mmol/L — ABNORMAL LOW (ref 22–32)
Calcium: 8.9 mg/dL (ref 8.9–10.3)
Chloride: 109 mmol/L (ref 98–111)
Creatinine, Ser: 1.8 mg/dL — ABNORMAL HIGH (ref 0.44–1.00)
GFR calc Af Amer: 29 mL/min — ABNORMAL LOW (ref >60)
GFR calc non Af Amer: 25 mL/min — ABNORMAL LOW (ref >60)
Glucose, Bld: 145 mg/dL — ABNORMAL HIGH (ref 70–99)
Potassium: 3.6 mmol/L (ref 3.5–5.1)
Sodium: 137 mmol/L (ref 135–145)

## 2019-04-01 LAB — LACTATE DEHYDROGENASE: LDH: 122 U/L (ref 98–192)

## 2019-04-01 MED ORDER — SODIUM CHLORIDE 0.9 % IV SOLN
Freq: Once | INTRAVENOUS | Status: AC
  Administered 2019-04-01: 15:00:00 via INTRAVENOUS
  Filled 2019-04-01: qty 250

## 2019-04-01 MED ORDER — SODIUM CHLORIDE 0.9 % IV SOLN
510.0000 mg | Freq: Once | INTRAVENOUS | Status: AC
  Administered 2019-04-01: 510 mg via INTRAVENOUS
  Filled 2019-04-01: qty 17

## 2019-04-01 NOTE — Progress Notes (Signed)
[place] OFFICE PROGRESS NOTE  Patient Care Team: Einar Pheasant, MD as PCP - General (Internal Medicine) Einar Pheasant, MD (Internal Medicine) Leia Alf, MD (Inactive) as Referring Physician (Internal Medicine) Isaias Cowman, MD (Internal Medicine) Albesa Seen, MD (Unknown Physician Specialty) Philis Kendall, MD (Unknown Physician Specialty) Brendolyn Patty, MD (Specialist) Schnier, Dolores Lory, MD (Vascular Surgery) Leia Alf, MD (Inactive) as Referring Physician (Internal Medicine) Isaias Cowman, MD (Internal Medicine) Albesa Seen, MD (Unknown Physician Specialty) Philis Kendall, MD (Unknown Physician Specialty) Brendolyn Patty, MD (Specialist) Schnier, Dolores Lory, MD (Vascular Surgery)   SUMMARY OF HEMATOLOGIC HISTORY:  # IRON DEFICIENCY ANEMIA- recurrent [? GI blood loss; Dr.Elliot; AVM-capsule]; IV Ferrahem q 37m last colo- 2012/march; EGD-- [id number]   #Colonoscopy-April 2020 aborted because of cardiac pause.  # CKD stage III-IV; CAD-Dr. PSaralyn Pilar INTERVAL HISTORY:   85-year-old female patient with above history of recurrent iron deficiency anemia unclear etiology is here for follow-up  Patient had attempted colonoscopy/however had a cardiac pause; and hence the procedure was aborted.  Patient had couple of episodes of chest pain sharp that last for few minutes for the last couple of nights.  She does not have any chest pressure otherwise on exertion.  Complains of worsening fatigue/shortness of breath on exertion.  She complains of worsening fatigue.  Denies any blood in stools or black or stools.  Shortness of breath on exertion  Review of Systems  Constitutional: Positive for malaise/fatigue. Negative for chills, diaphoresis, fever and weight loss.  HENT: Negative for nosebleeds and sore throat.   Eyes: Negative for double vision.  Respiratory: Positive for shortness of breath. Negative for cough, hemoptysis, sputum  production and wheezing.   Cardiovascular: Positive for chest pain. Negative for palpitations, orthopnea and leg swelling.  Gastrointestinal: Negative for abdominal pain, blood in stool, constipation, diarrhea, heartburn, melena, nausea and vomiting.  Genitourinary: Negative for dysuria, frequency and urgency.  Musculoskeletal: Negative for back pain and joint pain.  Skin: Negative.  Negative for itching and rash.  Neurological: Negative for dizziness, tingling, focal weakness, weakness and headaches.  Endo/Heme/Allergies: Does not bruise/bleed easily.  Psychiatric/Behavioral: Negative for depression. The patient is not nervous/anxious and does not have insomnia.      PAST MEDICAL HISTORY :  Past Medical History:  Diagnosis Date  . Arthritis   . AV malformation of gastrointestinal tract   . Blood in stool   . CAD (coronary artery disease)   . Carotid arterial disease ([place]   . Cataracts, bilateral   . Chronic blood loss anemia   . Chronic cystitis   . Depression   . Diabetes mellitus ([place]   . GERD (gastroesophageal reflux disease)   . Hyperlipidemia   . Hypertension   . IDA (iron deficiency anemia)   . Stress incontinence     PAST SURGICAL HISTORY :   Past Surgical History:  Procedure Laterality Date  . ABDOMINAL HYSTERECTOMY  1972   ovaries left in place  . APPENDECTOMY  1992  . BACK SURGERY  06/08/2010   L3, L4, L5  . CAR[place]  [place]  [place]   [place] then [place]   . CHOLECYSTECTOMY  1993  . COLONOSCOPY WITH PROPOFOL N/A 02/02/2019   Procedure: COLONOSCOPY WITH PROPOFOL;  Surgeon: EManya Silvas MD;  [facility];  Service: Endoscopy;  Laterality: N/A;  . ESOPHAGOGASTRODUODENOSCOPY N/A 02/02/2019   Procedure: ESOPHAGOGASTRODUODENOSCOPY (EGD);  Surgeon: EManya Silvas MD;  [facility];  Service:  Endoscopy;  Laterality: N/A;  . FOOT SURGERY  06/2007   Left  . right carotid artery surgery   01/09/13   Dr. Delana Meyer @ [facility]  . TOTAL HIP ARTHROPLASTY  05/03/2011   left 12, right 11/07    FAMILY HISTORY :   Family History  Problem Relation Age of Onset  . Hyperlipidemia Father   . Heart disease Father        myocardial infarction  . Hypertension Father        Parent  . Arthritis Other        parent  . Diabetes Other        nephew  . Cervical cancer Sister   . Rectal cancer Sister   . Breast cancer Neg Hx     SOCIAL HISTORY:   Social History   Tobacco Use  . Smoking status: Former Smoker    Types: Cigarettes    Last attempt to quit: 11/19/1989    Years since quitting: 29.3  . Smokeless tobacco: Never Used  Substance Use Topics  . Alcohol use: No    Alcohol/week: 0.0 standard drinks  . Drug use: No    ALLERGIES:  is allergic to ciprofloxacin; levaquin [levofloxacin]; propofol; and tequin [gatifloxacin].  MEDICATIONS:  Current Outpatient Medications  Medication Sig Dispense Refill  . aspirin 81 MG tablet Take 81 mg by mouth daily.    . Biotin 5000 MCG CAPS Take 1 capsule by mouth daily.    . cetirizine (ZYRTEC) 10 MG tablet Take 10 mg by mouth daily.    . Omega-3 Fatty Acids (FISH OIL) 1200 MG CAPS Take 1 capsule by mouth daily.    . ramipril (ALTACE) 5 MG capsule Take 1 capsule by mouth daily.    . rosuvastatin (CRESTOR) 40 MG tablet Take 1 tablet (40 mg total) by mouth daily. 90 tablet 3  . sucralfate (CARAFATE) 1 g tablet Take 1 tablet by mouth 3 (three) times daily.    . traZODone (DESYREL) 50 MG tablet Take 0.5-1 tablets (25-50 mg total) by mouth at bedtime as needed for sleep. 90 tablet 0   No current facility-administered medications for this visit.     PHYSICAL EXAMINATION:   BP (!) 126/51   Pulse 72   Temp (!) 96.3 F (35.7 C) (Tympanic)   Resp 18   Ht 4' 11"  (1.499 m)   Wt 137 lb (62.1 kg)   BMI 27.67 kg/m   Filed Weights   04/01/19 1345  Weight: 137 lb (62.1 kg)    Physical Exam  Constitutional: She is oriented to person, place,  and time and well-developed, well-nourished, and in no distress.  HENT:  Head: Normocephalic and atraumatic.  Mouth/Throat: Oropharynx is clear and moist. No oropharyngeal exudate.  Eyes: Pupils are equal, round, and reactive to light.  Positive for pallor.  Neck: Normal range of motion. Neck supple.  Cardiovascular: Normal rate and regular rhythm.  Pulmonary/Chest: No respiratory distress. She has no wheezes.  Abdominal: Soft. Bowel sounds are normal. She exhibits no distension and no mass. There is no abdominal tenderness. There is no rebound and no guarding.  Musculoskeletal: Normal range of motion.        General: No tenderness or edema.  Neurological: She is alert and oriented to person, place, and time.  Skin: Skin is warm.  Psychiatric: Affect normal.    LABORATORY DATA:  I have reviewed the data as listed    Component Value Date/Time   NA 137 04/01/2019 1248  NA 143 01/10/2013 0314   K 3.6 04/01/2019 1248   K 4.0 01/10/2013 0314   CL 109 04/01/2019 1248   CL 109 (H) 01/10/2013 0314   CO2 21 (L) 04/01/2019 1248   CO2 27 01/10/2013 0314   GLUCOSE 145 (H) 04/01/2019 1248   GLUCOSE 119 (H) 01/10/2013 0314   BUN 27 (H) 04/01/2019 1248   BUN 15 01/10/2013 0314   CREATININE 1.80 (H) 04/01/2019 1248   CREATININE 1.03 01/10/2013 0314   CREATININE 1.13 (H) 09/18/2012 0848   CALCIUM 8.9 04/01/2019 1248   CALCIUM 8.1 (L) 01/10/2013 0314   PROT 5.7 (L) 02/02/2019 0943   PROT 7.2 12/20/2011 1521   ALBUMIN 3.1 (L) 02/02/2019 0943   ALBUMIN 3.9 12/20/2011 1521   AST 42 (H) 02/02/2019 0943   AST 31 12/20/2011 1521   ALT 24 02/02/2019 0943   ALT 37 12/20/2011 1521   ALKPHOS 46 02/02/2019 0943   ALKPHOS 57 12/20/2011 1521   BILITOT 0.4 02/02/2019 0943   BILITOT 0.5 12/20/2011 1521   GFRNONAA 25 (L) 04/01/2019 1248   GFRNONAA 52 (L) 01/10/2013 0314   GFRNONAA 47 (L) 09/18/2012 0848   GFRAA 29 (L) 04/01/2019 1248   GFRAA >60 01/10/2013 0314   GFRAA 54 (L) 09/18/2012 0848     No results found for: SPEP, UPEP  Lab Results  Component Value Date   WBC 7.4 04/01/2019   NEUTROABS 4.9 04/01/2019   HGB 7.3 (L) 04/01/2019   HCT 23.8 (L) 04/01/2019   MCV 95.2 04/01/2019   PLT 239 04/01/2019      Chemistry      Component Value Date/Time   NA 137 04/01/2019 1248   NA 143 01/10/2013 0314   K 3.6 04/01/2019 1248   K 4.0 01/10/2013 0314   CL 109 04/01/2019 1248   CL 109 (H) 01/10/2013 0314   CO2 21 (L) 04/01/2019 1248   CO2 27 01/10/2013 0314   BUN 27 (H) 04/01/2019 1248   BUN 15 01/10/2013 0314   CREATININE 1.80 (H) 04/01/2019 1248   CREATININE 1.03 01/10/2013 0314   CREATININE 1.13 (H) 09/18/2012 0848      Component Value Date/Time   CALCIUM 8.9 04/01/2019 1248   CALCIUM 8.1 (L) 01/10/2013 0314   ALKPHOS 46 02/02/2019 0943   ALKPHOS 57 12/20/2011 1521   AST 42 (H) 02/02/2019 0943   AST 31 12/20/2011 1521   ALT 24 02/02/2019 0943   ALT 37 12/20/2011 1521   BILITOT 0.4 02/02/2019 0943   BILITOT 0.5 12/20/2011 1521         ASSESSMENT & PLAN:   Iron deficiency anemia due to chronic blood loss # Recurrent iron deficiency anemia/question AVM-CKD: Symptomatic. Hemoglobin is 7.3.  Proceed with Feraheme today.  #Discussed with the daughter that etiology is likely chronic kidney disease.  However, a bone marrow biopsy would be needed to rule out other causes like MDS.  She declines.  #Given the adequate iron supplementation-I would recommend addition of Aranesp every 2 weeks. Discussed use of erythropoietin stimulating agents like Aranesp to stimulate the bone marrow.  Discussed the potential issues with erythropoietin estimating agents-given the risk of stroke thromboembolic events/elevated blood pressure.  However, most of the serious events did not happen when the goal hematocrit is 33/hemoglobin 30.   # CKD stage III-IV- 1.8- Stable.   # Atypical chest pain question etiology/borderline low blood pressure [couple of times a week]; defer to  cardiology. Paraschoes  #Discussed with the patient's daughter in detail.  #  DISPOSITION:  # Ferrahem today. # H&H/hold tube/ Aranesp SQ in 1 w.  # cbc/Ferrahem in 3 weeks. # in week of June 15th- cbc/bmp/hold tube; possible aranesp-Dr.B  Cc; Dr.Scott.      Cammie Sickle, MD 04/01/2019 4:44 PM

## 2019-04-01 NOTE — Assessment & Plan Note (Addendum)
#  Recurrent iron deficiency anemia/question AVM-CKD: Symptomatic. Hemoglobin is 7.3.  Proceed with Feraheme today.  #Discussed with the daughter that etiology is likely chronic kidney disease.  However, a bone marrow biopsy would be needed to rule out other causes like MDS.  She declines.  #Given the adequate iron supplementation-I would recommend addition of Aranesp every 2 weeks. Discussed use of erythropoietin stimulating agents like Aranesp to stimulate the bone marrow.  Discussed the potential issues with erythropoietin estimating agents-given the risk of stroke thromboembolic events/elevated blood pressure.  However, most of the serious events did not happen when the goal hematocrit is 33/hemoglobin 30.   # CKD stage III-IV- 1.8- Stable.   # Atypical chest pain question etiology/borderline low blood pressure [couple of times a week]; defer to cardiology. Paraschoes  #Discussed with the patient's daughter in detail.  # DISPOSITION:  # Ferrahem today. # H&H/hold tube/ Aranesp SQ in 1 w.  # cbc/Ferrahem in 3 weeks. # in week of June 15th- cbc/bmp/hold tube; possible aranesp-Dr.B  Cc; Dr.Scott.

## 2019-04-02 ENCOUNTER — Other Ambulatory Visit: Payer: Self-pay | Admitting: Internal Medicine

## 2019-04-02 LAB — MULTIPLE MYELOMA PANEL, SERUM
Albumin SerPl Elph-Mcnc: 3.6 g/dL (ref 2.9–4.4)
Albumin/Glob SerPl: 1.4 (ref 0.7–1.7)
Alpha 1: 0.2 g/dL (ref 0.0–0.4)
Alpha2 Glob SerPl Elph-Mcnc: 0.9 g/dL (ref 0.4–1.0)
B-Globulin SerPl Elph-Mcnc: 1 g/dL (ref 0.7–1.3)
Gamma Glob SerPl Elph-Mcnc: 0.5 g/dL (ref 0.4–1.8)
Globulin, Total: 2.7 g/dL (ref 2.2–3.9)
IgA: 237 mg/dL (ref 64–422)
IgG (Immunoglobin G), Serum: 667 mg/dL (ref 586–1602)
IgM (Immunoglobulin M), Srm: 45 mg/dL (ref 26–217)
Total Protein ELP: 6.3 g/dL (ref 6.0–8.5)

## 2019-04-02 LAB — KAPPA/LAMBDA LIGHT CHAINS
Kappa free light chain: 41.6 mg/L — ABNORMAL HIGH (ref 3.3–19.4)
Kappa, lambda light chain ratio: 1.9 — ABNORMAL HIGH (ref 0.26–1.65)
Lambda free light chains: 21.9 mg/L (ref 5.7–26.3)

## 2019-04-02 LAB — SAMPLE TO BLOOD BANK

## 2019-04-02 LAB — HAPTOGLOBIN: Haptoglobin: 181 mg/dL (ref 41–333)

## 2019-04-03 ENCOUNTER — Other Ambulatory Visit: Payer: Self-pay

## 2019-04-03 DIAGNOSIS — D500 Iron deficiency anemia secondary to blood loss (chronic): Secondary | ICD-10-CM

## 2019-04-07 ENCOUNTER — Other Ambulatory Visit: Payer: Self-pay

## 2019-04-08 ENCOUNTER — Inpatient Hospital Stay: Payer: Medicare Other

## 2019-04-08 ENCOUNTER — Inpatient Hospital Stay: Payer: Medicare Other | Admitting: *Deleted

## 2019-04-08 ENCOUNTER — Other Ambulatory Visit: Payer: Self-pay

## 2019-04-08 VITALS — BP 116/64 | HR 89

## 2019-04-08 DIAGNOSIS — D631 Anemia in chronic kidney disease: Secondary | ICD-10-CM

## 2019-04-08 DIAGNOSIS — D500 Iron deficiency anemia secondary to blood loss (chronic): Secondary | ICD-10-CM

## 2019-04-08 DIAGNOSIS — I129 Hypertensive chronic kidney disease with stage 1 through stage 4 chronic kidney disease, or unspecified chronic kidney disease: Secondary | ICD-10-CM | POA: Diagnosis not present

## 2019-04-08 LAB — CBC WITH DIFFERENTIAL/PLATELET
Abs Immature Granulocytes: 0.02 10*3/uL (ref 0.00–0.07)
Basophils Absolute: 0.1 10*3/uL (ref 0.0–0.1)
Basophils Relative: 1 %
Eosinophils Absolute: 0.2 10*3/uL (ref 0.0–0.5)
Eosinophils Relative: 2 %
HCT: 24.5 % — ABNORMAL LOW (ref 36.0–46.0)
Hemoglobin: 7.4 g/dL — ABNORMAL LOW (ref 12.0–15.0)
Immature Granulocytes: 0 %
Lymphocytes Relative: 25 %
Lymphs Abs: 1.8 10*3/uL (ref 0.7–4.0)
MCH: 30.1 pg (ref 26.0–34.0)
MCHC: 30.2 g/dL (ref 30.0–36.0)
MCV: 99.6 fL (ref 80.0–100.0)
Monocytes Absolute: 0.4 10*3/uL (ref 0.1–1.0)
Monocytes Relative: 5 %
Neutro Abs: 4.7 10*3/uL (ref 1.7–7.7)
Neutrophils Relative %: 67 %
Platelets: 218 10*3/uL (ref 150–400)
RBC: 2.46 MIL/uL — ABNORMAL LOW (ref 3.87–5.11)
RDW: 20.2 % — ABNORMAL HIGH (ref 11.5–15.5)
WBC: 7.1 10*3/uL (ref 4.0–10.5)
nRBC: 0 % (ref 0.0–0.2)

## 2019-04-08 LAB — FERRITIN: Ferritin: 263 ng/mL (ref 11–307)

## 2019-04-08 LAB — IRON AND TIBC
Iron: 73 ug/dL (ref 28–170)
Saturation Ratios: 19 % (ref 10.4–31.8)
TIBC: 388 ug/dL (ref 250–450)
UIBC: 315 ug/dL

## 2019-04-08 MED ORDER — EPOETIN ALFA-EPBX 10000 UNIT/ML IJ SOLN
20000.0000 [IU] | Freq: Once | INTRAMUSCULAR | Status: AC
  Administered 2019-04-08: 20000 [IU] via SUBCUTANEOUS
  Filled 2019-04-08: qty 2

## 2019-04-14 ENCOUNTER — Other Ambulatory Visit: Payer: Self-pay

## 2019-04-14 ENCOUNTER — Other Ambulatory Visit (INDEPENDENT_AMBULATORY_CARE_PROVIDER_SITE_OTHER): Payer: Medicare Other

## 2019-04-14 DIAGNOSIS — E119 Type 2 diabetes mellitus without complications: Secondary | ICD-10-CM | POA: Diagnosis not present

## 2019-04-14 DIAGNOSIS — E7800 Pure hypercholesterolemia, unspecified: Secondary | ICD-10-CM

## 2019-04-14 LAB — BASIC METABOLIC PANEL
BUN: 17 mg/dL (ref 6–23)
CO2: 24 mEq/L (ref 19–32)
Calcium: 8.9 mg/dL (ref 8.4–10.5)
Chloride: 107 mEq/L (ref 96–112)
Creatinine, Ser: 1.49 mg/dL — ABNORMAL HIGH (ref 0.40–1.20)
GFR: 33.27 mL/min — ABNORMAL LOW (ref >60.00)
Glucose, Bld: 120 mg/dL — ABNORMAL HIGH (ref 70–99)
Potassium: 3.9 mEq/L (ref 3.5–5.1)
Sodium: 140 mEq/L (ref 135–145)

## 2019-04-14 LAB — HEPATIC FUNCTION PANEL
ALT: 13 U/L (ref 0–35)
AST: 18 U/L (ref 0–37)
Albumin: 3.7 g/dL (ref 3.5–5.2)
Alkaline Phosphatase: 61 U/L (ref 39–117)
Bilirubin, Direct: 0.1 mg/dL (ref 0.0–0.3)
Total Bilirubin: 0.4 mg/dL (ref 0.2–1.2)
Total Protein: 5.9 g/dL — ABNORMAL LOW (ref 6.0–8.3)

## 2019-04-14 LAB — LIPID PANEL
Cholesterol: 139 mg/dL (ref 0–200)
HDL: 45.7 mg/dL (ref >39.00)
NonHDL: 93.35
Total CHOL/HDL Ratio: 3
Triglycerides: 203 mg/dL — ABNORMAL HIGH (ref 0.0–149.0)
VLDL: 40.6 mg/dL — ABNORMAL HIGH (ref 0.0–40.0)

## 2019-04-14 LAB — HEMOGLOBIN A1C: Hgb A1c MFr Bld: 5.2 % (ref 4.6–6.5)

## 2019-04-14 LAB — LDL CHOLESTEROL, DIRECT: Direct LDL: 51 mg/dL

## 2019-04-16 ENCOUNTER — Other Ambulatory Visit: Payer: Self-pay

## 2019-04-16 ENCOUNTER — Ambulatory Visit (INDEPENDENT_AMBULATORY_CARE_PROVIDER_SITE_OTHER): Payer: Medicare Other | Admitting: Internal Medicine

## 2019-04-16 DIAGNOSIS — E1159 Type 2 diabetes mellitus with other circulatory complications: Secondary | ICD-10-CM

## 2019-04-16 DIAGNOSIS — N183 Chronic kidney disease, stage 3 unspecified: Secondary | ICD-10-CM

## 2019-04-16 DIAGNOSIS — R079 Chest pain, unspecified: Secondary | ICD-10-CM

## 2019-04-16 DIAGNOSIS — D631 Anemia in chronic kidney disease: Secondary | ICD-10-CM

## 2019-04-16 DIAGNOSIS — R0602 Shortness of breath: Secondary | ICD-10-CM

## 2019-04-16 DIAGNOSIS — I2510 Atherosclerotic heart disease of native coronary artery without angina pectoris: Secondary | ICD-10-CM

## 2019-04-16 DIAGNOSIS — I4891 Unspecified atrial fibrillation: Secondary | ICD-10-CM

## 2019-04-16 DIAGNOSIS — I779 Disorder of arteries and arterioles, unspecified: Secondary | ICD-10-CM

## 2019-04-16 DIAGNOSIS — I739 Peripheral vascular disease, unspecified: Secondary | ICD-10-CM

## 2019-04-16 DIAGNOSIS — I952 Hypotension due to drugs: Secondary | ICD-10-CM

## 2019-04-16 DIAGNOSIS — E7800 Pure hypercholesterolemia, unspecified: Secondary | ICD-10-CM

## 2019-04-16 NOTE — Progress Notes (Signed)
Patient ID: Jocelyn Elliott, female   DOB: September 26, 1932, 85 y.o.   MRN: [id number]   Virtual Visit via telephone Note  This visit type was conducted due to national recommendations for restrictions regarding the COVID-19 pandemic (e.g. social distancing).  This format is felt to be most appropriate for this patient at this time.  All issues noted in this document were discussed and addressed.  No physical exam was performed (except for noted visual exam findings with Video Visits).   I connected with Jocelyn Elliott telephone and verified that I am speaking with the correct person using two identifiers. Location patient: home Location provider: work Persons participating in the telephone visit: patient, provider and pts daughter Jocelyn Elliott.   I discussed the limitations, risks, security and privacy concerns of performing an evaluation and management service by telephone and the availability of in person appointments. The patient expressed understanding and agreed to proceed.   Reason for visit: scheduled follow up  HPI: She is being followed by Dr Rogue Bussing for iron deficient anemia.  Recent hgb 7.4.  Receiving IV Feraheme and 04/08/19 - Retacrit.  Recently attempted colonoscopy, but  Due to 8 second pause and atrial fib, unable to have colonoscopy.  Metoprolol was stopped.  Had holter monitor - predominant sinus rhythm, no episodes of afib or pauses.  Had ECHO 11/2018 - EF 55%, moderate AS and mild to moderate MR.  Saw cardiology 02/16/19.  She reports that she has had intermittent episodes of chest pain.  Last episode was last night.  States her chest pain occurs when she gets up at night and goes to the bathroom.  States last nigth - up to bathroom. Noticed chest pain.  States blood pressure initially 126/60.  Pain lasted 30 minutes.  F/u blood pressure 108/59 with pulse 81.  No chest pain since last night.  Has noticed some sob with exertion.  SOB when walks to her mailbox.  Decreased energy.  Fatigue.   States if she walks too fast, will get light headed.  She has also noticed that her blood pressure has been low.  Dr Rogue Bussing stopped ramipril.  Has been off ramipril since 01/31/19.  States blood pressure today 94/47.  She is eating.  No vomiting. Bowels moving.  No blood in the stool.     ROS: See pertinent positives and negatives per HPI.  Past Medical History:  Diagnosis Date  . Arthritis   . AV malformation of gastrointestinal tract   . Blood in stool   . CAD (coronary artery disease)   . Carotid arterial disease ([place])   . Cataracts, bilateral   . Chronic blood loss anemia   . Chronic cystitis   . Depression   . Diabetes mellitus ([place])   . GERD (gastroesophageal reflux disease)   . Hyperlipidemia   . Hypertension   . IDA (iron deficiency anemia)   . Stress incontinence     Past Surgical History:  Procedure Laterality Date  . ABDOMINAL HYSTERECTOMY  1972   ovaries left in place  . APPENDECTOMY  1992  . BACK SURGERY  06/08/2010   L3, L4, L5  . CAROTID ARTERY ANGIOPLASTY  11/03  . [place], 92   right then left   . CHOLECYSTECTOMY  1993  . COLONOSCOPY WITH PROPOFOL N/A 02/02/2019   Procedure: COLONOSCOPY WITH PROPOFOL;  Surgeon: Manya Silvas, MD;  [facility];  Service: Endoscopy;  Laterality: N/A;  . ESOPHAGOGASTRODUODENOSCOPY N/A 02/02/2019   Procedure: ESOPHAGOGASTRODUODENOSCOPY (EGD);  Surgeon: Manya Silvas, MD;  Location: [facility]COPY;  Service: Endoscopy;  Laterality: N/A;  . FOOT SURGERY  06/2007   Left  . right carotid artery surgery  01/09/13   Dr. Delana Meyer @ [facility]  . TOTAL HIP ARTHROPLASTY  05/03/2011   left 12, right 11/07    Family History  Problem Relation Age of Onset  . Hyperlipidemia Father   . Heart disease Father        myocardial infarction  . Hypertension Father        Parent  . Arthritis Other        parent  . Diabetes Other        nephew  . Cervical cancer Sister   . Rectal cancer Sister   .  Breast cancer Neg Hx     SOCIAL HX: reviewed.   Current Outpatient Medications:  .  aspirin 81 MG tablet, Take 81 mg by mouth daily., Disp: , Rfl:  .  Biotin 5000 MCG CAPS, Take 1 capsule by mouth daily., Disp: , Rfl:  .  cetirizine (ZYRTEC) 10 MG tablet, Take 10 mg by mouth daily., Disp: , Rfl:  .  Omega-3 Fatty Acids (FISH OIL) 1200 MG CAPS, Take 1 capsule by mouth daily., Disp: , Rfl:  .  ramipril (ALTACE) 5 MG capsule, Take 1 capsule by mouth daily., Disp: , Rfl:  .  rosuvastatin (CRESTOR) 40 MG tablet, Take 1 tablet (40 mg total) by mouth daily., Disp: 90 tablet, Rfl: 3 .  sucralfate (CARAFATE) 1 g tablet, Take 1 tablet by mouth 3 (three) times daily., Disp: , Rfl:  .  traZODone (DESYREL) 50 MG tablet, Take 0.5-1 tablets (25-50 mg total) by mouth at bedtime as needed for sleep., Disp: 90 tablet, Rfl: 0  EXAM:  VITALS per patient if applicable:  13/08  GENERAL: alert. Sounds to be in no acute distress.  Answering questions appropriately.    PSYCH/NEURO: pleasant and cooperative, no obvious depression or anxiety, speech and thought processing grossly intact  ASSESSMENT AND PLAN:  Discussed the following assessment and plan:  Atrial fibrillation, unspecified type ([place])  Anemia due to stage 3 chronic kidney disease ([place])  Coronary artery disease involving native coronary artery of native heart without angina pectoris  Carotid artery disease, unspecified laterality, unspecified type (HCC)  CKD (chronic kidney disease) stage 3, GFR 30-59 ml/min (HCC)  Type 2 diabetes mellitus with other circulatory complication, without long-term current use of insulin (HCC)  Hypercholesteremia  Hypotension due to drugs  SOB (shortness of breath) on exertion  Chest pain, unspecified type  Afib ([place]) Had brief episode after attempted colonoscopy.  Saw cardiology in follow up.  Had holter and echo as outlined.  holter - no afib.  Follow.   Anemia Followed by hematology.  Has been  receiving IV iron.  Just received - retacrit injection.  Last hgb 7.4.  Attempted colonoscopy - cancelled due to pause and afib.  Per Dr Tiffany Kocher - no repeat colonoscopy.  Has f/u with hematology next week.   CAD (coronary artery disease) Sees cardiology.  Having chest pain - intermittent episodes. Last episode - last night.  Also has noticed increased shortness of breath with exertion, increased fatigue and low blood pressure as outlined.  Discussed further evaluation.  Declines ER evaluation at this time.  No pain now.  Agreed to f/u with cardiology.  appt scheduled for tomorrow.    Carotid arterial disease Followed by [facility].   CKD (chronic kidney disease) stage 3, GFR 30-59  ml/min (HCC) Recent creatinine improved from previous check (creatinine 1.49 with GFR - 33).  Avoid antiinflammatories.    Diabetes mellitus A1c just checked 5.9.  Follow.    Hypercholesteremia On crestor.  Lab Results  Component Value Date   CHOL 139 04/14/2019   HDL 45.70 04/14/2019   LDLCALC 47 10/13/2018   LDLDIRECT 51.0 04/14/2019   TRIG 203.0 (H) 04/14/2019   CHOLHDL 3 04/14/2019    Hypotension She had noticed low blood pressure recently as outlined.  Off ramipril now.  Still low.  hgb 7.4.  Seeing hematology.  Discussed staying hydrated.   F/u with cardiology as outlined.  Slow position changes and movements.    SOB (shortness of breath) on exertion Increased sob with exertion.  Discussed could be multifactorial. Low hgb.  Has known CAD.  Discussed further evaluation and w/up.  She is agreeable for f/u with cardiology. appt made for tomorrow.    Chest pain Chest pain as outlined.  Intermittent episodes.  Last episode last night.  Low hgb.  Followed by Dr Rogue Bussing. Low blood pressure as outlined.  Off ramipril.  Worsening sob with exertion.  Labs reviewed.  Discussed further evaluation.  Agrees with cardiology evaluation.  appt made for tomorrow.  Declines ER evaluation.  Agreed if recurring persistent  symptoms, she would be evaluated.      I discussed the assessment and treatment plan with the patient. The patient was provided an opportunity to ask questions and all were answered. The patient agreed with the plan and demonstrated an understanding of the instructions.   The patient was advised to call back or seek an in-person evaluation if the symptoms worsen or if the condition fails to improve as anticipated.  I provided 32 minutes of non-face-to-face time during this encounter.   Einar Pheasant, MD

## 2019-04-17 ENCOUNTER — Encounter: Payer: Self-pay | Admitting: Internal Medicine

## 2019-04-17 DIAGNOSIS — R079 Chest pain, unspecified: Secondary | ICD-10-CM | POA: Insufficient documentation

## 2019-04-17 NOTE — Assessment & Plan Note (Signed)
Sees cardiology.  Having chest pain - intermittent episodes. Last episode - last night.  Also has noticed increased shortness of breath with exertion, increased fatigue and low blood pressure as outlined.  Discussed further evaluation.  Declines ER evaluation at this time.  No pain now.  Agreed to f/u with cardiology.  appt scheduled for tomorrow.

## 2019-04-17 NOTE — Assessment & Plan Note (Signed)
Followed by hematology.  Has been receiving IV iron.  Just received - retacrit injection.  Last hgb 7.4.  Attempted colonoscopy - cancelled due to pause and afib.  Per Dr Tiffany Kocher - no repeat colonoscopy.  Has f/u with hematology next week.

## 2019-04-17 NOTE — Assessment & Plan Note (Signed)
Followed by [facility].   

## 2019-04-17 NOTE — Assessment & Plan Note (Signed)
Recent creatinine improved from previous check (creatinine 1.49 with GFR - 33).  Avoid antiinflammatories.

## 2019-04-17 NOTE — Assessment & Plan Note (Signed)
Had brief episode after attempted colonoscopy.  Saw cardiology in follow up.  Had holter and echo as outlined.  holter - no afib.  Follow.

## 2019-04-17 NOTE — Assessment & Plan Note (Signed)
She had noticed low blood pressure recently as outlined.  Off ramipril now.  Still low.  hgb 7.4.  Seeing hematology.  Discussed staying hydrated.   F/u with cardiology as outlined.  Slow position changes and movements.

## 2019-04-17 NOTE — Assessment & Plan Note (Signed)
On crestor.  Lab Results  Component Value Date   CHOL 139 04/14/2019   HDL 45.70 04/14/2019   LDLCALC 47 10/13/2018   LDLDIRECT 51.0 04/14/2019   TRIG 203.0 (H) 04/14/2019   CHOLHDL 3 04/14/2019

## 2019-04-17 NOTE — Assessment & Plan Note (Signed)
A1c just checked 5.9.  Follow.

## 2019-04-17 NOTE — Assessment & Plan Note (Signed)
Increased sob with exertion.  Discussed could be multifactorial. Low hgb.  Has known CAD.  Discussed further evaluation and w/up.  She is agreeable for f/u with cardiology. appt made for tomorrow.

## 2019-04-17 NOTE — Assessment & Plan Note (Addendum)
Chest pain as outlined.  Intermittent episodes.  Last episode last night.  Low hgb.  Followed by Dr Rogue Bussing. Low blood pressure as outlined.  Off ramipril.  Worsening sob with exertion.  Labs reviewed.  Discussed further evaluation.  Agrees with cardiology evaluation.  appt made for tomorrow.  Declines ER evaluation.  Agreed if recurring persistent symptoms, she would be evaluated.

## 2019-04-20 ENCOUNTER — Other Ambulatory Visit: Payer: Self-pay | Admitting: *Deleted

## 2019-04-20 DIAGNOSIS — D500 Iron deficiency anemia secondary to blood loss (chronic): Secondary | ICD-10-CM

## 2019-04-22 ENCOUNTER — Other Ambulatory Visit: Payer: Self-pay

## 2019-04-22 ENCOUNTER — Inpatient Hospital Stay: Payer: Medicare Other

## 2019-04-22 ENCOUNTER — Inpatient Hospital Stay: Payer: Medicare Other | Attending: Internal Medicine

## 2019-04-22 VITALS — BP 105/65 | HR 81 | Resp 19

## 2019-04-22 DIAGNOSIS — D500 Iron deficiency anemia secondary to blood loss (chronic): Secondary | ICD-10-CM | POA: Diagnosis present

## 2019-04-22 DIAGNOSIS — D508 Other iron deficiency anemias: Secondary | ICD-10-CM

## 2019-04-22 DIAGNOSIS — R42 Dizziness and giddiness: Secondary | ICD-10-CM | POA: Insufficient documentation

## 2019-04-22 DIAGNOSIS — N184 Chronic kidney disease, stage 4 (severe): Secondary | ICD-10-CM | POA: Diagnosis present

## 2019-04-22 DIAGNOSIS — Q2733 Arteriovenous malformation of digestive system vessel: Secondary | ICD-10-CM | POA: Insufficient documentation

## 2019-04-22 DIAGNOSIS — D631 Anemia in chronic kidney disease: Secondary | ICD-10-CM | POA: Diagnosis not present

## 2019-04-22 LAB — SAMPLE TO BLOOD BANK

## 2019-04-22 LAB — BASIC METABOLIC PANEL
Anion gap: 8 (ref 5–15)
BUN: 23 mg/dL (ref 8–23)
CO2: 21 mmol/L — ABNORMAL LOW (ref 22–32)
Calcium: 8.8 mg/dL — ABNORMAL LOW (ref 8.9–10.3)
Chloride: 110 mmol/L (ref 98–111)
Creatinine, Ser: 1.7 mg/dL — ABNORMAL HIGH (ref 0.44–1.00)
GFR calc Af Amer: 32 mL/min — ABNORMAL LOW (ref >60)
GFR calc non Af Amer: 27 mL/min — ABNORMAL LOW (ref >60)
Glucose, Bld: 138 mg/dL — ABNORMAL HIGH (ref 70–99)
Potassium: 3.9 mmol/L (ref 3.5–5.1)
Sodium: 139 mmol/L (ref 135–145)

## 2019-04-22 LAB — CBC WITH DIFFERENTIAL/PLATELET
Abs Immature Granulocytes: 0.01 10*3/uL (ref 0.00–0.07)
Basophils Absolute: 0.1 10*3/uL (ref 0.0–0.1)
Basophils Relative: 1 %
Eosinophils Absolute: 0.2 10*3/uL (ref 0.0–0.5)
Eosinophils Relative: 3 %
HCT: 24.5 % — ABNORMAL LOW (ref 36.0–46.0)
Hemoglobin: 7.2 g/dL — ABNORMAL LOW (ref 12.0–15.0)
Immature Granulocytes: 0 %
Lymphocytes Relative: 28 %
Lymphs Abs: 1.9 10*3/uL (ref 0.7–4.0)
MCH: 29.3 pg (ref 26.0–34.0)
MCHC: 29.4 g/dL — ABNORMAL LOW (ref 30.0–36.0)
MCV: 99.6 fL (ref 80.0–100.0)
Monocytes Absolute: 0.4 10*3/uL (ref 0.1–1.0)
Monocytes Relative: 6 %
Neutro Abs: 4.3 10*3/uL (ref 1.7–7.7)
Neutrophils Relative %: 62 %
Platelets: 230 10*3/uL (ref 150–400)
RBC: 2.46 MIL/uL — ABNORMAL LOW (ref 3.87–5.11)
RDW: 18.6 % — ABNORMAL HIGH (ref 11.5–15.5)
WBC: 6.9 10*3/uL (ref 4.0–10.5)
nRBC: 0 % (ref 0.0–0.2)

## 2019-04-22 MED ORDER — SODIUM CHLORIDE 0.9 % IV SOLN
Freq: Once | INTRAVENOUS | Status: AC
  Administered 2019-04-22: 14:00:00 via INTRAVENOUS
  Filled 2019-04-22: qty 250

## 2019-04-22 MED ORDER — SODIUM CHLORIDE 0.9 % IV SOLN
510.0000 mg | Freq: Once | INTRAVENOUS | Status: AC
  Administered 2019-04-22: 510 mg via INTRAVENOUS
  Filled 2019-04-22: qty 17

## 2019-05-05 ENCOUNTER — Other Ambulatory Visit: Payer: Self-pay

## 2019-05-06 ENCOUNTER — Inpatient Hospital Stay (HOSPITAL_BASED_OUTPATIENT_CLINIC_OR_DEPARTMENT_OTHER): Payer: Medicare Other | Admitting: Internal Medicine

## 2019-05-06 ENCOUNTER — Inpatient Hospital Stay: Payer: Medicare Other

## 2019-05-06 ENCOUNTER — Other Ambulatory Visit: Payer: Self-pay

## 2019-05-06 DIAGNOSIS — N183 Chronic kidney disease, stage 3 unspecified: Secondary | ICD-10-CM

## 2019-05-06 DIAGNOSIS — D500 Iron deficiency anemia secondary to blood loss (chronic): Secondary | ICD-10-CM

## 2019-05-06 DIAGNOSIS — D631 Anemia in chronic kidney disease: Secondary | ICD-10-CM | POA: Diagnosis not present

## 2019-05-06 DIAGNOSIS — N184 Chronic kidney disease, stage 4 (severe): Secondary | ICD-10-CM | POA: Diagnosis not present

## 2019-05-06 DIAGNOSIS — Q2733 Arteriovenous malformation of digestive system vessel: Secondary | ICD-10-CM

## 2019-05-06 DIAGNOSIS — R42 Dizziness and giddiness: Secondary | ICD-10-CM

## 2019-05-06 LAB — CBC WITH DIFFERENTIAL/PLATELET
Abs Immature Granulocytes: 0.01 10*3/uL (ref 0.00–0.07)
Basophils Absolute: 0.1 10*3/uL (ref 0.0–0.1)
Basophils Relative: 1 %
Eosinophils Absolute: 0.2 10*3/uL (ref 0.0–0.5)
Eosinophils Relative: 4 %
HCT: 26.4 % — ABNORMAL LOW (ref 36.0–46.0)
Hemoglobin: 8 g/dL — ABNORMAL LOW (ref 12.0–15.0)
Immature Granulocytes: 0 %
Lymphocytes Relative: 29 %
Lymphs Abs: 1.6 10*3/uL (ref 0.7–4.0)
MCH: 31.4 pg (ref 26.0–34.0)
MCHC: 30.3 g/dL (ref 30.0–36.0)
MCV: 103.5 fL — ABNORMAL HIGH (ref 80.0–100.0)
Monocytes Absolute: 0.4 10*3/uL (ref 0.1–1.0)
Monocytes Relative: 6 %
Neutro Abs: 3.4 10*3/uL (ref 1.7–7.7)
Neutrophils Relative %: 60 %
Platelets: 178 10*3/uL (ref 150–400)
RBC: 2.55 MIL/uL — ABNORMAL LOW (ref 3.87–5.11)
RDW: 19.6 % — ABNORMAL HIGH (ref 11.5–15.5)
WBC: 5.6 10*3/uL (ref 4.0–10.5)
nRBC: 0 % (ref 0.0–0.2)

## 2019-05-06 MED ORDER — EPOETIN ALFA-EPBX 10000 UNIT/ML IJ SOLN
20000.0000 [IU] | Freq: Once | INTRAMUSCULAR | Status: AC
  Administered 2019-05-06: 20000 [IU] via SUBCUTANEOUS
  Filled 2019-05-06: qty 2

## 2019-05-06 NOTE — Patient Instructions (Signed)
#   HOLD Isosrobide tonite # HOLD ramipril today.  # call Dr.Paraschoes office re: re-starting of above medications.

## 2019-05-06 NOTE — Progress Notes (Signed)
Patient has an increase in fatigue in SOBr.  Does have dark colored stools with the last one 2 days ago.  Was scheduled for colonoscopy by Dr. Vira Agar but not able to proceed due to cardiac issues.  She is having fluctuations in BP and is 85/54 in office today, is followed by cardiologist.  She is keeping a BP log and I advised her to contact cardiologist office to update them on bp readings.

## 2019-05-06 NOTE — Progress Notes (Signed)
[place] OFFICE PROGRESS NOTE  Patient Care Team: Einar Pheasant, MD as PCP - General (Internal Medicine) Einar Pheasant, MD (Internal Medicine) Leia Alf, MD (Inactive) as Referring Physician (Internal Medicine) Isaias Cowman, MD (Internal Medicine) Albesa Seen, MD (Unknown Physician Specialty) Philis Kendall, MD (Unknown Physician Specialty) Brendolyn Patty, MD (Specialist) Schnier, Dolores Lory, MD (Vascular Surgery) Leia Alf, MD (Inactive) as Referring Physician (Internal Medicine) Isaias Cowman, MD (Internal Medicine) Albesa Seen, MD (Unknown Physician Specialty) Philis Kendall, MD (Unknown Physician Specialty) Brendolyn Patty, MD (Specialist) Schnier, Dolores Lory, MD (Vascular Surgery)   SUMMARY OF HEMATOLOGIC HISTORY:  # IRON DEFICIENCY ANEMIA- recurrent [? GI blood loss; Dr.Elliot; AVM-capsule]; IV Ferrahem q 52m last colo- 2012/march; EGD-- [id number]   #Colonoscopy-April 2020 aborted because of cardiac pause.  # CKD stage III-IV; CAD-Dr. PSaralyn Pilar INTERVAL HISTORY:   85-year-old female patient with above history of recurrent iron deficiency anemia unclear etiology is here for follow-up  Patient feels poorly.  She complains of continued dizziness.  She states her blood pressure is in the range of 900Xto 1381Wsystolic at home.  Denies any blood in stools or black or stools.  Complains of continued fatigue/shortness of breath on exertion.  She denies any swelling in the legs.  Review of Systems  Constitutional: Positive for malaise/fatigue. Negative for chills, diaphoresis, fever and weight loss.  HENT: Negative for nosebleeds and sore throat.   Eyes: Negative for double vision.  Respiratory: Positive for shortness of breath. Negative for cough, hemoptysis, sputum production and wheezing.   Cardiovascular: Negative for palpitations, orthopnea and leg swelling.  Gastrointestinal: Negative for abdominal pain, blood in stool,  constipation, diarrhea, heartburn, melena, nausea and vomiting.  Genitourinary: Negative for dysuria, frequency and urgency.  Musculoskeletal: Negative for back pain and joint pain.  Skin: Negative.  Negative for itching and rash.  Neurological: Negative for dizziness, tingling, focal weakness, weakness and headaches.  Endo/Heme/Allergies: Does not bruise/bleed easily.  Psychiatric/Behavioral: Negative for depression. The patient is not nervous/anxious and does not have insomnia.      PAST MEDICAL HISTORY :  Past Medical History:  Diagnosis Date  . Arthritis   . AV malformation of gastrointestinal tract   . Blood in stool   . CAD (coronary artery disease)   . Carotid arterial disease ([place]   . Cataracts, bilateral   . Chronic blood loss anemia   . Chronic cystitis   . Depression   . Diabetes mellitus ([place]   . GERD (gastroesophageal reflux disease)   . Hyperlipidemia   . Hypertension   . IDA (iron deficiency anemia)   . Stress incontinence     PAST SURGICAL HISTORY :   Past Surgical History:  Procedure Laterality Date  . ABDOMINAL HYSTERECTOMY  1972   ovaries left in place  . APPENDECTOMY  1992  . BACK SURGERY  06/08/2010   L3, L4, L5  . CAROTID ARTERY ANGIOPLASTY  11/03  . [place] 89   right then left   . CHOLECYSTECTOMY  1993  . COLONOSCOPY WITH PROPOFOL N/A 02/02/2019   Procedure: COLONOSCOPY WITH PROPOFOL;  Surgeon: EManya Silvas MD;  [facility];  Service: Endoscopy;  Laterality: N/A;  . ESOPHAGOGASTRODUODENOSCOPY N/A 02/02/2019   Procedure: ESOPHAGOGASTRODUODENOSCOPY (EGD);  Surgeon: EManya Silvas MD;  [facility];  Service: Endoscopy;  Laterality: N/A;  . FOOT SURGERY  06/2007   Left  . right carotid artery surgery  01/09/13   Dr. SDelana Meyer@ [facility]  .  TOTAL HIP ARTHROPLASTY  05/03/2011   left 12, right 11/07    FAMILY HISTORY :   Family History  Problem Relation Age of Onset  . Hyperlipidemia Father    . Heart disease Father        myocardial infarction  . Hypertension Father        Parent  . Arthritis Other        parent  . Diabetes Other        nephew  . Cervical cancer Sister   . Rectal cancer Sister   . Breast cancer Neg Hx     SOCIAL HISTORY:   Social History   Tobacco Use  . Smoking status: Former Smoker    Types: Cigarettes    Quit date: 11/19/1989    Years since quitting: 29.4  . Smokeless tobacco: Never Used  Substance Use Topics  . Alcohol use: No    Alcohol/week: 0.0 standard drinks  . Drug use: No    ALLERGIES:  is allergic to ciprofloxacin; levaquin [levofloxacin]; propofol; and tequin [gatifloxacin].  MEDICATIONS:  Current Outpatient Medications  Medication Sig Dispense Refill  . aspirin 81 MG tablet Take 81 mg by mouth daily.    . Biotin 5000 MCG CAPS Take 1 capsule by mouth daily.    . cetirizine (ZYRTEC) 10 MG tablet Take 10 mg by mouth daily.    . isosorbide mononitrate (IMDUR) 30 MG 24 hr tablet Take by mouth.    . Omega-3 Fatty Acids (FISH OIL) 1200 MG CAPS Take 1 capsule by mouth daily.    Marland Kitchen omeprazole (PRILOSEC) 40 MG capsule TAKE 1 TABLET 30 MINS BEFORE BREAKFAST AND 1 TABLET 30 MINS BEFORE DINNER.    Marland Kitchen rosuvastatin (CRESTOR) 40 MG tablet Take 1 tablet (40 mg total) by mouth daily. 90 tablet 3  . traZODone (DESYREL) 50 MG tablet Take 0.5-1 tablets (25-50 mg total) by mouth at bedtime as needed for sleep. 90 tablet 0  . ramipril (ALTACE) 5 MG capsule Take 1 capsule by mouth daily.    . sucralfate (CARAFATE) 1 g tablet Take 1 tablet by mouth 3 (three) times daily.     No current facility-administered medications for this visit.     PHYSICAL EXAMINATION:   BP (!) 85/54   Pulse 97   Temp (!) 97.1 F (36.2 C)   Resp 16   Wt 137 lb (62.1 kg)   BMI 27.67 kg/m   Filed Weights   05/06/19 0950  Weight: 137 lb (62.1 kg)    Physical Exam  Constitutional: She is oriented to person, place, and time and well-developed, well-nourished, and in  no distress.  Frail-appearing Caucasian female patient.  She is in a wheelchair.  HENT:  Head: Normocephalic and atraumatic.  Mouth/Throat: Oropharynx is clear and moist. No oropharyngeal exudate.  Eyes: Pupils are equal, round, and reactive to light.  Positive for pallor.  Neck: Normal range of motion. Neck supple.  Cardiovascular: Normal rate and regular rhythm.  Pulmonary/Chest: No respiratory distress. She has no wheezes.  Abdominal: Soft. Bowel sounds are normal. She exhibits no distension and no mass. There is no abdominal tenderness. There is no rebound and no guarding.  Musculoskeletal: Normal range of motion.        General: No tenderness or edema.  Neurological: She is alert and oriented to person, place, and time.  Skin: Skin is warm.  Psychiatric: Affect normal.    LABORATORY DATA:  I have reviewed the data as listed    Component  Value Date/Time   NA 139 04/22/2019 1309   NA 143 01/10/2013 0314   K 3.9 04/22/2019 1309   K 4.0 01/10/2013 0314   CL 110 04/22/2019 1309   CL 109 (H) 01/10/2013 0314   CO2 21 (L) 04/22/2019 1309   CO2 27 01/10/2013 0314   GLUCOSE 138 (H) 04/22/2019 1309   GLUCOSE 119 (H) 01/10/2013 0314   BUN 23 04/22/2019 1309   BUN 15 01/10/2013 0314   CREATININE 1.70 (H) 04/22/2019 1309   CREATININE 1.03 01/10/2013 0314   CREATININE 1.13 (H) 09/18/2012 0848   CALCIUM 8.8 (L) 04/22/2019 1309   CALCIUM 8.1 (L) 01/10/2013 0314   PROT 5.9 (L) 04/14/2019 0835   PROT 7.2 12/20/2011 1521   ALBUMIN 3.7 04/14/2019 0835   ALBUMIN 3.9 12/20/2011 1521   AST 18 04/14/2019 0835   AST 31 12/20/2011 1521   ALT 13 04/14/2019 0835   ALT 37 12/20/2011 1521   ALKPHOS 61 04/14/2019 0835   ALKPHOS 57 12/20/2011 1521   BILITOT 0.4 04/14/2019 0835   BILITOT 0.5 12/20/2011 1521   GFRNONAA 27 (L) 04/22/2019 1309   GFRNONAA 52 (L) 01/10/2013 0314   GFRNONAA 47 (L) 09/18/2012 0848   GFRAA 32 (L) 04/22/2019 1309   GFRAA >60 01/10/2013 0314   GFRAA 54 (L)  09/18/2012 0848    No results found for: SPEP, UPEP  Lab Results  Component Value Date   WBC 5.6 05/06/2019   NEUTROABS 3.4 05/06/2019   HGB 8.0 (L) 05/06/2019   HCT 26.4 (L) 05/06/2019   MCV 103.5 (H) 05/06/2019   PLT 178 05/06/2019      Chemistry      Component Value Date/Time   NA 139 04/22/2019 1309   NA 143 01/10/2013 0314   K 3.9 04/22/2019 1309   K 4.0 01/10/2013 0314   CL 110 04/22/2019 1309   CL 109 (H) 01/10/2013 0314   CO2 21 (L) 04/22/2019 1309   CO2 27 01/10/2013 0314   BUN 23 04/22/2019 1309   BUN 15 01/10/2013 0314   CREATININE 1.70 (H) 04/22/2019 1309   CREATININE 1.03 01/10/2013 0314   CREATININE 1.13 (H) 09/18/2012 0848      Component Value Date/Time   CALCIUM 8.8 (L) 04/22/2019 1309   CALCIUM 8.1 (L) 01/10/2013 0314   ALKPHOS 61 04/14/2019 0835   ALKPHOS 57 12/20/2011 1521   AST 18 04/14/2019 0835   AST 31 12/20/2011 1521   ALT 13 04/14/2019 0835   ALT 37 12/20/2011 1521   BILITOT 0.4 04/14/2019 0835   BILITOT 0.5 12/20/2011 1521         ASSESSMENT & PLAN:   Iron deficiency anemia due to chronic blood loss # Recurrent iron deficiency anemia/question AVM-CKD vs question MDS.  Patient previously declined bone marrow biopsy.  #Patient is quite symptomatic. Hemoglobin is 8.0 today;  Proceed with retacrit today.  Recent Feraheme in June.  # CKD stage III-IV- 1.8- stable.   #Dizzy-borderline blood pressures 85/55 blood pressure. patient on Imdur 30 mg/ramipril 5 mg.  Recommend patient hold off her blood pressure medication tonight.  I have sent a message to Dr. Luna Fuse my concerns of antihypertensives in face of low blood pressures.  I have also asked the patient to call cardiology office regarding continuation of her blood pressure medications.  #I tried to reach patient's daughter Shirlean Mylar at her work number-unable to to leave a message.  Phone number [phone].   # DISPOSITION:  # Retacrit today.  # H&H-Retacrit 2  weeks. # in 4  weeks-  MD-labs- cbc/bmp/hold tube; possible retacrit Dr.B  Cc; Dr.Scott.      Cammie Sickle, MD 05/06/2019 4:59 PM

## 2019-05-06 NOTE — Assessment & Plan Note (Addendum)
#  Recurrent iron deficiency anemia/question AVM-CKD vs question MDS.  Patient previously declined bone marrow biopsy.  #Patient is quite symptomatic. Hemoglobin is 8.0 today;  Proceed with retacrit today.  Recent Feraheme in June.  # CKD stage III-IV- 1.8- stable.   #Dizzy-borderline blood pressures 85/55 blood pressure. patient on Imdur 30 mg/ramipril 5 mg.  Recommend patient hold off her blood pressure medication tonight.  I have sent a message to Dr. Luna Fuse my concerns of antihypertensives in face of low blood pressures.  I have also asked the patient to call cardiology office regarding continuation of her blood pressure medications.  #I tried to reach patient's daughter Shirlean Mylar at her work number-unable to to leave a message.  Phone number [phone].   # DISPOSITION:  # Retacrit today.  # H&H-Retacrit 2 weeks. # in 4 weeks-  MD-labs- cbc/bmp/hold tube; possible retacrit Dr.B  Cc; Dr.Scott.

## 2019-05-11 ENCOUNTER — Other Ambulatory Visit: Payer: Self-pay | Admitting: Internal Medicine

## 2019-05-19 ENCOUNTER — Other Ambulatory Visit: Payer: Self-pay | Admitting: *Deleted

## 2019-05-19 DIAGNOSIS — D500 Iron deficiency anemia secondary to blood loss (chronic): Secondary | ICD-10-CM

## 2019-05-20 ENCOUNTER — Inpatient Hospital Stay: Payer: Medicare Other

## 2019-05-20 ENCOUNTER — Other Ambulatory Visit: Payer: Self-pay

## 2019-05-20 ENCOUNTER — Inpatient Hospital Stay: Payer: Medicare Other | Attending: Internal Medicine

## 2019-05-20 VITALS — BP 111/67 | HR 90

## 2019-05-20 DIAGNOSIS — D631 Anemia in chronic kidney disease: Secondary | ICD-10-CM | POA: Diagnosis present

## 2019-05-20 DIAGNOSIS — D500 Iron deficiency anemia secondary to blood loss (chronic): Secondary | ICD-10-CM

## 2019-05-20 DIAGNOSIS — N183 Chronic kidney disease, stage 3 unspecified: Secondary | ICD-10-CM

## 2019-05-20 DIAGNOSIS — N184 Chronic kidney disease, stage 4 (severe): Secondary | ICD-10-CM | POA: Insufficient documentation

## 2019-05-20 LAB — HEMOGLOBIN: Hemoglobin: 7.7 g/dL — ABNORMAL LOW (ref 12.0–15.0)

## 2019-05-20 LAB — HEMATOCRIT: HCT: 26.1 % — ABNORMAL LOW (ref 36.0–46.0)

## 2019-05-20 MED ORDER — EPOETIN ALFA-EPBX 10000 UNIT/ML IJ SOLN
20000.0000 [IU] | Freq: Once | INTRAMUSCULAR | Status: AC
  Administered 2019-05-20: 10:00:00 20000 [IU] via SUBCUTANEOUS
  Filled 2019-05-20: qty 2

## 2019-06-02 ENCOUNTER — Other Ambulatory Visit: Payer: Self-pay

## 2019-06-03 ENCOUNTER — Inpatient Hospital Stay: Payer: Medicare Other

## 2019-06-03 ENCOUNTER — Other Ambulatory Visit: Payer: Self-pay | Admitting: *Deleted

## 2019-06-03 ENCOUNTER — Inpatient Hospital Stay (HOSPITAL_BASED_OUTPATIENT_CLINIC_OR_DEPARTMENT_OTHER): Payer: Medicare Other | Admitting: Internal Medicine

## 2019-06-03 ENCOUNTER — Other Ambulatory Visit: Payer: Self-pay

## 2019-06-03 ENCOUNTER — Encounter: Payer: Self-pay | Admitting: Internal Medicine

## 2019-06-03 DIAGNOSIS — D500 Iron deficiency anemia secondary to blood loss (chronic): Secondary | ICD-10-CM

## 2019-06-03 DIAGNOSIS — D631 Anemia in chronic kidney disease: Secondary | ICD-10-CM

## 2019-06-03 DIAGNOSIS — N184 Chronic kidney disease, stage 4 (severe): Secondary | ICD-10-CM | POA: Diagnosis not present

## 2019-06-03 LAB — CBC WITH DIFFERENTIAL/PLATELET
Abs Immature Granulocytes: 0.03 10*3/uL (ref 0.00–0.07)
Basophils Absolute: 0 10*3/uL (ref 0.0–0.1)
Basophils Relative: 1 %
Eosinophils Absolute: 0.2 10*3/uL (ref 0.0–0.5)
Eosinophils Relative: 3 %
HCT: 20.3 % — ABNORMAL LOW (ref 36.0–46.0)
Hemoglobin: 6 g/dL — ABNORMAL LOW (ref 12.0–15.0)
Immature Granulocytes: 1 %
Lymphocytes Relative: 17 %
Lymphs Abs: 0.9 10*3/uL (ref 0.7–4.0)
MCH: 27.3 pg (ref 26.0–34.0)
MCHC: 29.6 g/dL — ABNORMAL LOW (ref 30.0–36.0)
MCV: 92.3 fL (ref 80.0–100.0)
Monocytes Absolute: 0.3 10*3/uL (ref 0.1–1.0)
Monocytes Relative: 6 %
Neutro Abs: 4 10*3/uL (ref 1.7–7.7)
Neutrophils Relative %: 72 %
Platelets: 223 10*3/uL (ref 150–400)
RBC: 2.2 MIL/uL — ABNORMAL LOW (ref 3.87–5.11)
RDW: 17.2 % — ABNORMAL HIGH (ref 11.5–15.5)
WBC: 5.5 10*3/uL (ref 4.0–10.5)
nRBC: 0 % (ref 0.0–0.2)

## 2019-06-03 LAB — BASIC METABOLIC PANEL
Anion gap: 7 (ref 5–15)
BUN: 22 mg/dL (ref 8–23)
CO2: 20 mmol/L — ABNORMAL LOW (ref 22–32)
Calcium: 8.4 mg/dL — ABNORMAL LOW (ref 8.9–10.3)
Chloride: 110 mmol/L (ref 98–111)
Creatinine, Ser: 1.49 mg/dL — ABNORMAL HIGH (ref 0.44–1.00)
GFR calc Af Amer: 37 mL/min — ABNORMAL LOW (ref >60)
GFR calc non Af Amer: 32 mL/min — ABNORMAL LOW (ref >60)
Glucose, Bld: 218 mg/dL — ABNORMAL HIGH (ref 70–99)
Potassium: 3.1 mmol/L — ABNORMAL LOW (ref 3.5–5.1)
Sodium: 137 mmol/L (ref 135–145)

## 2019-06-03 LAB — PREPARE RBC (CROSSMATCH)

## 2019-06-03 LAB — SAMPLE TO BLOOD BANK

## 2019-06-03 MED ORDER — EPOETIN ALFA-EPBX 40000 UNIT/ML IJ SOLN
40000.0000 [IU] | Freq: Once | INTRAMUSCULAR | Status: AC
  Administered 2019-06-03: 40000 [IU] via SUBCUTANEOUS
  Filled 2019-06-03: qty 1

## 2019-06-03 NOTE — Progress Notes (Signed)
[place] OFFICE PROGRESS NOTE  Patient Care Team: Einar Pheasant, MD as PCP - General (Internal Medicine) Einar Pheasant, MD (Internal Medicine) Leia Alf, MD (Inactive) as Referring Physician (Internal Medicine) Isaias Cowman, MD (Internal Medicine) Albesa Seen, MD (Unknown Physician Specialty) Philis Kendall, MD (Unknown Physician Specialty) Brendolyn Patty, MD (Specialist) Schnier, Dolores Lory, MD (Vascular Surgery) Leia Alf, MD (Inactive) as Referring Physician (Internal Medicine) Isaias Cowman, MD (Internal Medicine) Albesa Seen, MD (Unknown Physician Specialty) Philis Kendall, MD (Unknown Physician Specialty) Brendolyn Patty, MD (Specialist) Schnier, Dolores Lory, MD (Vascular Surgery)   SUMMARY OF HEMATOLOGIC HISTORY:  # IRON DEFICIENCY ANEMIA- recurrent [? GI blood loss; Dr.Elliot; AVM-capsule]; IV Ferrahem q 38m last colo- 2012/march; EGD-- [id number]   #Colonoscopy-April 2020 aborted because of cardiac pause.  # CKD stage III-IV; CAD-Dr. PSaralyn Pilar INTERVAL HISTORY:   85-year-old female patient with above history of recurrent iron deficiency anemia unclear etiology is here for follow-up.  Patient continues to feel poorly.  She complains of extreme fatigue; shortness of breath on exertion.  Denies any blood in stools or black-colored stools.  No swelling in legs.  She does complain of intermittent chest discomfort; which resolves by itself.  Denies any ongoing pain.  Denies any pain at this time.  Review of Systems  Constitutional: Positive for malaise/fatigue. Negative for chills, diaphoresis, fever and weight loss.  HENT: Negative for nosebleeds and sore throat.   Eyes: Negative for double vision.  Respiratory: Positive for shortness of breath. Negative for cough, hemoptysis, sputum production and wheezing.   Cardiovascular: Negative for palpitations, orthopnea and leg swelling.  Gastrointestinal: Negative for abdominal  pain, blood in stool, constipation, diarrhea, heartburn, melena, nausea and vomiting.  Genitourinary: Negative for dysuria, frequency and urgency.  Musculoskeletal: Negative for back pain and joint pain.  Skin: Negative.  Negative for itching and rash.  Neurological: Negative for dizziness, tingling, focal weakness, weakness and headaches.  Endo/Heme/Allergies: Does not bruise/bleed easily.  Psychiatric/Behavioral: Negative for depression. The patient is not nervous/anxious and does not have insomnia.      PAST MEDICAL HISTORY :  Past Medical History:  Diagnosis Date  . Arthritis   . AV malformation of gastrointestinal tract   . Blood in stool   . CAD (coronary artery disease)   . Carotid arterial disease ([place]   . Cataracts, bilateral   . Chronic blood loss anemia   . Chronic cystitis   . Depression   . Diabetes mellitus ([place]   . GERD (gastroesophageal reflux disease)   . Hyperlipidemia   . Hypertension   . IDA (iron deficiency anemia)   . Stress incontinence     PAST SURGICAL HISTORY :   Past Surgical History:  Procedure Laterality Date  . ABDOMINAL HYSTERECTOMY  1972   ovaries left in place  . APPENDECTOMY  1992  . BACK SURGERY  06/08/2010   L3, L4, L5  . CAROTID ARTERY ANGIOPLASTY  11/03  . [place] 89   right then left   . CHOLECYSTECTOMY  1993  . COLONOSCOPY WITH PROPOFOL N/A 02/02/2019   Procedure: COLONOSCOPY WITH PROPOFOL;  Surgeon: EManya Silvas MD;  [facility];  Service: Endoscopy;  Laterality: N/A;  . ESOPHAGOGASTRODUODENOSCOPY N/A 02/02/2019   Procedure: ESOPHAGOGASTRODUODENOSCOPY (EGD);  Surgeon: EManya Silvas MD;  [facility];  Service: Endoscopy;  Laterality: N/A;  . FOOT SURGERY  06/2007   Left  . right carotid artery surgery  01/09/13   Dr. SDelana Meyer@ [facility]  .  TOTAL HIP ARTHROPLASTY  05/03/2011   left 12, right 11/07    FAMILY HISTORY :   Family History  Problem Relation Age of Onset  .  Hyperlipidemia Father   . Heart disease Father        myocardial infarction  . Hypertension Father        Parent  . Arthritis Other        parent  . Diabetes Other        nephew  . Cervical cancer Sister   . Rectal cancer Sister   . Breast cancer Neg Hx     SOCIAL HISTORY:   Social History   Tobacco Use  . Smoking status: Former Smoker    Types: Cigarettes    Quit date: 11/19/1989    Years since quitting: 29.5  . Smokeless tobacco: Never Used  Substance Use Topics  . Alcohol use: No    Alcohol/week: 0.0 standard drinks  . Drug use: No    ALLERGIES:  is allergic to ciprofloxacin; levaquin [levofloxacin]; propofol; and tequin [gatifloxacin].  MEDICATIONS:  Current Outpatient Medications  Medication Sig Dispense Refill  . aspirin 81 MG tablet Take 81 mg by mouth daily.    . cetirizine (ZYRTEC) 10 MG tablet Take 10 mg by mouth daily.    . isosorbide mononitrate (IMDUR) 30 MG 24 hr tablet Take by mouth.    . Omega-3 Fatty Acids (FISH OIL) 1200 MG CAPS Take 1 capsule by mouth daily.    Marland Kitchen omeprazole (PRILOSEC) 40 MG capsule TAKE 1 TABLET 30 MINS BEFORE BREAKFAST AND 1 TABLET 30 MINS BEFORE DINNER.    Marland Kitchen rosuvastatin (CRESTOR) 40 MG tablet Take 1 tablet (40 mg total) by mouth daily. 90 tablet 3  . sucralfate (CARAFATE) 1 g tablet Take 1 tablet by mouth 3 (three) times daily.    . traZODone (DESYREL) 50 MG tablet TAKE 0.5-1 TABLETS (25-50 MG TOTAL) BY MOUTH AT BEDTIME AS NEEDED FOR SLEEP. 90 tablet 0  . Biotin 5000 MCG CAPS Take 1 capsule by mouth daily.     No current facility-administered medications for this visit.     PHYSICAL EXAMINATION:   BP (!) 92/56 (BP Location: Left Arm)   Pulse 83   Temp 98.2 F (36.8 C) (Tympanic)   Resp 18   Wt 137 lb 6.4 oz (62.3 kg)   SpO2 99%   BMI 27.75 kg/m   Filed Weights   06/03/19 1030  Weight: 137 lb 6.4 oz (62.3 kg)    Physical Exam  Constitutional: She is oriented to person, place, and time and well-developed,  well-nourished, and in no distress.  Frail-appearing Caucasian female patient.  She is in a wheelchair.  HENT:  Head: Normocephalic and atraumatic.  Mouth/Throat: Oropharynx is clear and moist. No oropharyngeal exudate.  Eyes: Pupils are equal, round, and reactive to light.  Positive for pallor.  Neck: Normal range of motion. Neck supple.  Cardiovascular: Normal rate and regular rhythm.  Pulmonary/Chest: No respiratory distress. She has no wheezes.  Abdominal: Soft. Bowel sounds are normal. She exhibits no distension and no mass. There is no abdominal tenderness. There is no rebound and no guarding.  Musculoskeletal: Normal range of motion.        General: No tenderness or edema.  Neurological: She is alert and oriented to person, place, and time.  Skin: Skin is warm.  Psychiatric: Affect normal.    LABORATORY DATA:  I have reviewed the data as listed    Component Value Date/Time  NA 137 06/03/2019 0942   NA 143 01/10/2013 0314   K 3.1 (L) 06/03/2019 0942   K 4.0 01/10/2013 0314   CL 110 06/03/2019 0942   CL 109 (H) 01/10/2013 0314   CO2 20 (L) 06/03/2019 0942   CO2 27 01/10/2013 0314   GLUCOSE 218 (H) 06/03/2019 0942   GLUCOSE 119 (H) 01/10/2013 0314   BUN 22 06/03/2019 0942   BUN 15 01/10/2013 0314   CREATININE 1.49 (H) 06/03/2019 0942   CREATININE 1.03 01/10/2013 0314   CREATININE 1.13 (H) 09/18/2012 0848   CALCIUM 8.4 (L) 06/03/2019 0942   CALCIUM 8.1 (L) 01/10/2013 0314   PROT 5.9 (L) 04/14/2019 0835   PROT 7.2 12/20/2011 1521   ALBUMIN 3.7 04/14/2019 0835   ALBUMIN 3.9 12/20/2011 1521   AST 18 04/14/2019 0835   AST 31 12/20/2011 1521   ALT 13 04/14/2019 0835   ALT 37 12/20/2011 1521   ALKPHOS 61 04/14/2019 0835   ALKPHOS 57 12/20/2011 1521   BILITOT 0.4 04/14/2019 0835   BILITOT 0.5 12/20/2011 1521   GFRNONAA 32 (L) 06/03/2019 0942   GFRNONAA 52 (L) 01/10/2013 0314   GFRNONAA 47 (L) 09/18/2012 0848   GFRAA 37 (L) 06/03/2019 0942   GFRAA >60 01/10/2013  0314   GFRAA 54 (L) 09/18/2012 0848    No results found for: SPEP, UPEP  Lab Results  Component Value Date   WBC 5.5 06/03/2019   NEUTROABS 4.0 06/03/2019   HGB 6.0 (L) 06/03/2019   HCT 20.3 (L) 06/03/2019   MCV 92.3 06/03/2019   PLT 223 06/03/2019      Chemistry      Component Value Date/Time   NA 137 06/03/2019 0942   NA 143 01/10/2013 0314   K 3.1 (L) 06/03/2019 0942   K 4.0 01/10/2013 0314   CL 110 06/03/2019 0942   CL 109 (H) 01/10/2013 0314   CO2 20 (L) 06/03/2019 0942   CO2 27 01/10/2013 0314   BUN 22 06/03/2019 0942   BUN 15 01/10/2013 0314   CREATININE 1.49 (H) 06/03/2019 0942   CREATININE 1.03 01/10/2013 0314   CREATININE 1.13 (H) 09/18/2012 0848      Component Value Date/Time   CALCIUM 8.4 (L) 06/03/2019 0942   CALCIUM 8.1 (L) 01/10/2013 0314   ALKPHOS 61 04/14/2019 0835   ALKPHOS 57 12/20/2011 1521   AST 18 04/14/2019 0835   AST 31 12/20/2011 1521   ALT 13 04/14/2019 0835   ALT 37 12/20/2011 1521   BILITOT 0.4 04/14/2019 0835   BILITOT 0.5 12/20/2011 1521         ASSESSMENT & PLAN:   Iron deficiency anemia due to chronic blood loss # Recurrent iron deficiency anemia/question AVM-CKD vs question MDS.  Worsening-today hemoglobin is 6.0.   #Proceed with Retacrit increase the dose to 40,000 mcg; patient received iron April 20, 2019.  Also recommend to units of PRBC transfusion tomorrow.  #Long discussion with patient and daughter-regarding the concerns nonresponse current therapy with Retacrit/Feraheme.  I am concerned about a bone marrow process like myelodysplastic syndrome/leukemia.  Recommend a bone marrow biopsy.  # CKD stage III-IV- 1. 6 stable  #Dizziness/chest discomfort-suspect secondary to severe anemia.  Reviewed the blood pressure at home ~systolic around 706C to 376E.   # spoke to patient's daughter Robin-over the phone.  Discussed the need for a bone marrow biopsy; she will inform us of the decision soon.  # DISPOSITION:  #  Retacrit today. # PRBC transfusion; tomorrow.   #  in 2 weeks-  MD-labs- cbc/bmp/hold tube; possible retacrit Dr.B  Cc; Dr.Scott.      Cammie Sickle, MD 06/03/2019 12:26 PM

## 2019-06-03 NOTE — Progress Notes (Signed)
Pt in for follow up reports having a "low hemoglobin"  Pt reports being dizzy and blood pressure elevates when standing.

## 2019-06-03 NOTE — Assessment & Plan Note (Addendum)
#  Recurrent iron deficiency anemia/question AVM-CKD vs question MDS.  Worsening-today hemoglobin is 6.0.   #Proceed with Retacrit increase the dose to 40,000 mcg; patient received iron April 20, 2019.  Also recommend to units of PRBC transfusion tomorrow.  #Long discussion with patient and daughter-regarding the concerns nonresponse current therapy with Retacrit/Feraheme.  I am concerned about a bone marrow process like myelodysplastic syndrome/leukemia.  Recommend a bone marrow biopsy.  # CKD stage III-IV- 1. 6 stable  #Dizziness/chest discomfort-suspect secondary to severe anemia.  Reviewed the blood pressure at home ~systolic around 159E to 707A.   # spoke to patient's daughter Robin-over the phone.  Discussed the need for a bone marrow biopsy; she will inform us of the decision soon.  # DISPOSITION:  # Retacrit today. # PRBC transfusion; tomorrow.   # in 2 weeks-  MD-labs- cbc/bmp/hold tube; possible retacrit Dr.B  Cc; Dr.Scott.

## 2019-06-04 ENCOUNTER — Other Ambulatory Visit: Payer: Self-pay | Admitting: Internal Medicine

## 2019-06-04 ENCOUNTER — Other Ambulatory Visit: Payer: Self-pay

## 2019-06-04 ENCOUNTER — Inpatient Hospital Stay: Payer: Medicare Other

## 2019-06-04 DIAGNOSIS — N184 Chronic kidney disease, stage 4 (severe): Secondary | ICD-10-CM | POA: Diagnosis not present

## 2019-06-04 DIAGNOSIS — D500 Iron deficiency anemia secondary to blood loss (chronic): Secondary | ICD-10-CM

## 2019-06-04 DIAGNOSIS — D649 Anemia, unspecified: Secondary | ICD-10-CM

## 2019-06-04 MED ORDER — SODIUM CHLORIDE 0.9% IV SOLUTION
250.0000 mL | Freq: Once | INTRAVENOUS | Status: AC
  Administered 2019-06-04: 250 mL via INTRAVENOUS
  Filled 2019-06-04: qty 250

## 2019-06-04 MED ORDER — DIPHENHYDRAMINE HCL 25 MG PO CAPS
25.0000 mg | ORAL_CAPSULE | Freq: Once | ORAL | Status: AC
  Administered 2019-06-04: 25 mg via ORAL
  Filled 2019-06-04: qty 1

## 2019-06-04 MED ORDER — ACETAMINOPHEN 325 MG PO TABS
650.0000 mg | ORAL_TABLET | Freq: Once | ORAL | Status: AC
  Administered 2019-06-04: 650 mg via ORAL
  Filled 2019-06-04: qty 2

## 2019-06-05 LAB — TYPE AND SCREEN
ABO/RH(D): A POS
Antibody Screen: POSITIVE
Donor AG Type: NEGATIVE
Donor AG Type: NEGATIVE
Unit division: 0
Unit division: 0

## 2019-06-05 LAB — BPAM RBC
Blood Product Expiration Date: 202008072359
Blood Product Expiration Date: 202008102359
ISSUE DATE / TIME: 202007160958
ISSUE DATE / TIME: 202007161141
Unit Type and Rh: 6200
Unit Type and Rh: 6200

## 2019-06-08 ENCOUNTER — Telehealth: Payer: Self-pay | Admitting: *Deleted

## 2019-06-08 NOTE — Telephone Encounter (Signed)
Spoke with patient's daughter regarding bone marrow biopsy date/time/place and prep instructions. Bone marrow sch. 06/15/2019 with arrival of 730 am for an 830 am. Daughter will be the driver for the patient. She understands the hospital is still under visitor restrictions at this time. Pt will remain npo after midnight.  Daughter voiced concerns about the patient's hgb and the possible need for blood transfusion prior to the bone marrow next week. I spoke with Dr. Rogue Bussing - we will bring the patient in this week (Thur/Fri) for lab/possible blood/ possible retracrit. We will need to move the apts for 7/29 with MD to 8/3  I called daughter back and left a vm regarding the apts

## 2019-06-10 ENCOUNTER — Other Ambulatory Visit: Payer: Self-pay | Admitting: *Deleted

## 2019-06-10 DIAGNOSIS — D500 Iron deficiency anemia secondary to blood loss (chronic): Secondary | ICD-10-CM

## 2019-06-10 NOTE — Telephone Encounter (Signed)
Left 2nd voice mail for patient's daughter to confirm her apts tomorrow and upcoming apts.

## 2019-06-11 ENCOUNTER — Other Ambulatory Visit: Payer: Self-pay

## 2019-06-11 ENCOUNTER — Inpatient Hospital Stay: Payer: Medicare Other

## 2019-06-11 DIAGNOSIS — D500 Iron deficiency anemia secondary to blood loss (chronic): Secondary | ICD-10-CM

## 2019-06-11 DIAGNOSIS — N184 Chronic kidney disease, stage 4 (severe): Secondary | ICD-10-CM | POA: Diagnosis not present

## 2019-06-11 LAB — CBC WITH DIFFERENTIAL/PLATELET
Abs Immature Granulocytes: 0.02 10*3/uL (ref 0.00–0.07)
Basophils Absolute: 0.1 10*3/uL (ref 0.0–0.1)
Basophils Relative: 1 %
Eosinophils Absolute: 0.3 10*3/uL (ref 0.0–0.5)
Eosinophils Relative: 4 %
HCT: 32.7 % — ABNORMAL LOW (ref 36.0–46.0)
Hemoglobin: 10.2 g/dL — ABNORMAL LOW (ref 12.0–15.0)
Immature Granulocytes: 0 %
Lymphocytes Relative: 24 %
Lymphs Abs: 1.7 10*3/uL (ref 0.7–4.0)
MCH: 28.3 pg (ref 26.0–34.0)
MCHC: 31.2 g/dL (ref 30.0–36.0)
MCV: 90.6 fL (ref 80.0–100.0)
Monocytes Absolute: 0.5 10*3/uL (ref 0.1–1.0)
Monocytes Relative: 7 %
Neutro Abs: 4.6 10*3/uL (ref 1.7–7.7)
Neutrophils Relative %: 64 %
Platelets: 206 10*3/uL (ref 150–400)
RBC: 3.61 MIL/uL — ABNORMAL LOW (ref 3.87–5.11)
RDW: 16.5 % — ABNORMAL HIGH (ref 11.5–15.5)
WBC: 7.2 10*3/uL (ref 4.0–10.5)
nRBC: 0 % (ref 0.0–0.2)

## 2019-06-11 LAB — SAMPLE TO BLOOD BANK

## 2019-06-12 ENCOUNTER — Other Ambulatory Visit: Payer: Self-pay | Admitting: Student

## 2019-06-15 ENCOUNTER — Ambulatory Visit
Admission: RE | Admit: 2019-06-15 | Discharge: 2019-06-15 | Disposition: A | Discharge status: Home / Self Care | Payer: Medicare Other | Source: Ambulatory Visit | Attending: Internal Medicine | Admitting: Internal Medicine

## 2019-06-15 ENCOUNTER — Other Ambulatory Visit (HOSPITAL_COMMUNITY)
Admission: RE | Admit: 2019-06-15 | Disposition: A | Discharge status: Home / Self Care | Payer: Medicare Other | Source: Ambulatory Visit | Attending: Internal Medicine | Admitting: Internal Medicine

## 2019-06-15 ENCOUNTER — Other Ambulatory Visit: Payer: Self-pay

## 2019-06-15 DIAGNOSIS — D649 Anemia, unspecified: Secondary | ICD-10-CM | POA: Insufficient documentation

## 2019-06-15 DIAGNOSIS — I2510 Atherosclerotic heart disease of native coronary artery without angina pectoris: Secondary | ICD-10-CM | POA: Diagnosis not present

## 2019-06-15 DIAGNOSIS — Z8249 Family history of ischemic heart disease and other diseases of the circulatory system: Secondary | ICD-10-CM | POA: Insufficient documentation

## 2019-06-15 DIAGNOSIS — Z8049 Family history of malignant neoplasm of other genital organs: Secondary | ICD-10-CM | POA: Insufficient documentation

## 2019-06-15 DIAGNOSIS — Z7982 Long term (current) use of aspirin: Secondary | ICD-10-CM | POA: Diagnosis not present

## 2019-06-15 DIAGNOSIS — Z79899 Other long term (current) drug therapy: Secondary | ICD-10-CM | POA: Insufficient documentation

## 2019-06-15 DIAGNOSIS — E785 Hyperlipidemia, unspecified: Secondary | ICD-10-CM | POA: Insufficient documentation

## 2019-06-15 DIAGNOSIS — Z833 Family history of diabetes mellitus: Secondary | ICD-10-CM | POA: Diagnosis not present

## 2019-06-15 DIAGNOSIS — Z87891 Personal history of nicotine dependence: Secondary | ICD-10-CM | POA: Diagnosis not present

## 2019-06-15 DIAGNOSIS — I10 Essential (primary) hypertension: Secondary | ICD-10-CM | POA: Insufficient documentation

## 2019-06-15 DIAGNOSIS — E119 Type 2 diabetes mellitus without complications: Secondary | ICD-10-CM | POA: Diagnosis not present

## 2019-06-15 DIAGNOSIS — K219 Gastro-esophageal reflux disease without esophagitis: Secondary | ICD-10-CM | POA: Insufficient documentation

## 2019-06-15 HISTORY — DX: Other complications of anesthesia, initial encounter: T88.59XA

## 2019-06-15 LAB — CBC WITH DIFFERENTIAL/PLATELET
Abs Immature Granulocytes: 0.01 10*3/uL (ref 0.00–0.07)
Basophils Absolute: 0.1 10*3/uL (ref 0.0–0.1)
Basophils Relative: 2 %
Eosinophils Absolute: 0.3 10*3/uL (ref 0.0–0.5)
Eosinophils Relative: 5 %
HCT: 32.5 % — ABNORMAL LOW (ref 36.0–46.0)
Hemoglobin: 10.1 g/dL — ABNORMAL LOW (ref 12.0–15.0)
Immature Granulocytes: 0 %
Lymphocytes Relative: 24 %
Lymphs Abs: 1.4 10*3/uL (ref 0.7–4.0)
MCH: 27.8 pg (ref 26.0–34.0)
MCHC: 31.1 g/dL (ref 30.0–36.0)
MCV: 89.5 fL (ref 80.0–100.0)
Monocytes Absolute: 0.5 10*3/uL (ref 0.1–1.0)
Monocytes Relative: 8 %
Neutro Abs: 3.6 10*3/uL (ref 1.7–7.7)
Neutrophils Relative %: 61 %
Platelets: 188 10*3/uL (ref 150–400)
RBC: 3.63 MIL/uL — ABNORMAL LOW (ref 3.87–5.11)
RDW: 16.5 % — ABNORMAL HIGH (ref 11.5–15.5)
WBC: 5.9 10*3/uL (ref 4.0–10.5)
nRBC: 0 % (ref 0.0–0.2)

## 2019-06-15 LAB — PROTIME-INR
INR: 1 (ref 0.8–1.2)
Prothrombin Time: 13.1 seconds (ref 11.4–15.2)

## 2019-06-15 MED ORDER — FENTANYL CITRATE (PF) 100 MCG/2ML IJ SOLN
INTRAMUSCULAR | Status: AC | PRN
  Administered 2019-06-15: 25 ug via INTRAVENOUS

## 2019-06-15 MED ORDER — HEPARIN SOD (PORK) LOCK FLUSH 100 UNIT/ML IV SOLN
INTRAVENOUS | Status: AC
  Filled 2019-06-15: qty 5

## 2019-06-15 MED ORDER — MIDAZOLAM HCL 2 MG/2ML IJ SOLN
INTRAMUSCULAR | Status: AC
  Filled 2019-06-15: qty 4

## 2019-06-15 MED ORDER — MIDAZOLAM HCL 2 MG/2ML IJ SOLN
INTRAMUSCULAR | Status: AC | PRN
  Administered 2019-06-15 (×2): 0.5 mg via INTRAVENOUS

## 2019-06-15 MED ORDER — FENTANYL CITRATE (PF) 100 MCG/2ML IJ SOLN
INTRAMUSCULAR | Status: AC
  Filled 2019-06-15: qty 2

## 2019-06-15 MED ORDER — SODIUM CHLORIDE 0.9 % IV SOLN
INTRAVENOUS | Status: DC
  Administered 2019-06-15: 08:00:00 via INTRAVENOUS

## 2019-06-15 NOTE — H&P (Signed)
Chief Complaint: Patient was seen in consultation today for CT BM BX at the request of Brahmanday,Govinda R  Referring Physician(s): Brahmanday,Govinda R   History of Present Illness: Jocelyn Elliott is a 85 y.o. female with multiple medical problems and chronic anemia requiring transfusions. No complaints today. ROS neg.  Here for CT BM bx today.  Past Medical History:  Diagnosis Date  . Arthritis   . AV malformation of gastrointestinal tract   . Blood in stool   . CAD (coronary artery disease)   . Carotid arterial disease ([place])   . Cataracts, bilateral   . Chronic blood loss anemia   . Chronic cystitis   . Complication of anesthesia    reaction to propofol  . Depression   . Diabetes mellitus ([place])   . GERD (gastroesophageal reflux disease)   . Hyperlipidemia   . Hypertension   . IDA (iron deficiency anemia)   . Stress incontinence     Past Surgical History:  Procedure Laterality Date  . ABDOMINAL HYSTERECTOMY  1972   ovaries left in place  . APPENDECTOMY  1992  . BACK SURGERY  06/08/2010   L3, L4, L5  . CAROTID ARTERY ANGIOPLASTY  11/03  . [place], 92   right then left   . CHOLECYSTECTOMY  1993  . COLONOSCOPY WITH PROPOFOL N/A 02/02/2019   Procedure: COLONOSCOPY WITH PROPOFOL;  Surgeon: Manya Silvas, MD;  Location: [facility] ENDOSCOPY;  Service: Endoscopy;  Laterality: N/A;  . ESOPHAGOGASTRODUODENOSCOPY N/A 02/02/2019   Procedure: ESOPHAGOGASTRODUODENOSCOPY (EGD);  Surgeon: Manya Silvas, MD;  Location: [facility] ENDOSCOPY;  Service: Endoscopy;  Laterality: N/A;  . FOOT SURGERY  06/2007   Left  . right carotid artery surgery  01/09/13   Dr. Delana Meyer @ [facility]  . TOTAL HIP ARTHROPLASTY  05/03/2011   left 12, right 11/07    Allergies: Ciprofloxacin, Levaquin [levofloxacin], Propofol, and Tequin [gatifloxacin]  Medications: Prior to Admission medications   Medication Sig Start Date End Date Taking? Authorizing Provider  aspirin 81 MG  tablet Take 81 mg by mouth daily.   Yes [provider]  cetirizine (ZYRTEC) 10 MG tablet Take 10 mg by mouth daily.   Yes [provider]  isosorbide mononitrate (IMDUR) 30 MG 24 hr tablet Take by mouth. 04/17/19 04/16/20 Yes [provider]  Omega-3 Fatty Acids (FISH OIL) 1200 MG CAPS Take 1 capsule by mouth daily.   Yes [provider]  omeprazole (PRILOSEC) 40 MG capsule TAKE 1 TABLET 30 MINS BEFORE BREAKFAST AND 1 TABLET 30 MINS BEFORE DINNER. 03/11/19  Yes [provider]  rosuvastatin (CRESTOR) 40 MG tablet Take 1 tablet (40 mg total) by mouth daily. 06/12/18  Yes Einar Pheasant, MD  sucralfate (CARAFATE) 1 g tablet Take 1 tablet by mouth 3 (three) times daily. 03/11/19  Yes [provider]  traZODone (DESYREL) 50 MG tablet TAKE 0.5-1 TABLETS (25-50 MG TOTAL) BY MOUTH AT BEDTIME AS NEEDED FOR SLEEP. 05/11/19  Yes Crecencio Mc, MD  Biotin 5000 MCG CAPS Take 1 capsule by mouth daily.    [provider]     Family History  Problem Relation Age of Onset  . Hyperlipidemia Father   . Heart disease Father        myocardial infarction  . Hypertension Father        Parent  . Arthritis Other        parent  . Diabetes Other  nephew  . Cervical cancer Sister   . Rectal cancer Sister   . Breast cancer Neg Hx     Social History   Socioeconomic History  . Marital status: Widowed    Spouse name: Not on file  . Number of children: 1  . Years of education: 69  . Highest education level: Not on file  Occupational History  . Occupation: Retired  Scientific laboratory technician  . Financial resource strain: Not on file  . Food insecurity    Worry: Not on file    Inability: Not on file  . Transportation needs    Medical: Not on file    Non-medical: Not on file  Tobacco Use  . Smoking status: Former Smoker    Types: Cigarettes    Quit date: 11/19/1989    Years since quitting: 29.5  . Smokeless tobacco: Never Used  Substance and Sexual  Activity  . Alcohol use: No    Alcohol/week: 0.0 standard drinks  . Drug use: No  . Sexual activity: Not Currently  Lifestyle  . Physical activity    Days per week: Not on file    Minutes per session: Not on file  . Stress: Not on file  Relationships  . Social Herbalist on phone: Not on file    Gets together: Not on file    Attends religious service: Not on file    Active member of club or organization: Not on file    Attends meetings of clubs or organizations: Not on file    Relationship status: Not on file  Other Topics Concern  . Not on file  Social History Narrative   Regular exercise-no   Caffeine Use-yes      Review of Systems: A 12 point ROS discussed and pertinent positives are indicated in the HPI above.  All other systems are negative.  Review of Systems  Vital Signs: BP (!) 152/59   Pulse 80   Temp 97.9 F (36.6 C)   Ht 4\' 11"  (1.499 m)   Wt 61.7 kg   SpO2 96%   BMI 27.47 kg/m   Physical Exam Constitutional:      General: She is not in acute distress.    Appearance: She is not toxic-appearing.  Eyes:     General: No scleral icterus.    Conjunctiva/sclera: Conjunctivae normal.  Cardiovascular:     Rate and Rhythm: Normal rate and regular rhythm.     Heart sounds: Murmur present.  Pulmonary:     Effort: Pulmonary effort is normal. No respiratory distress.     Breath sounds: Rales present.  Abdominal:     General: There is no distension.     Palpations: Abdomen is soft.  Neurological:     General: No focal deficit present.     Mental Status: She is alert. Mental status is at baseline.     Imaging: No results found.  Labs:  CBC: Recent Labs    05/06/19 0926 05/20/19 0934 06/03/19 0942 06/11/19 0817 06/15/19 0742  WBC 5.6  --  5.5 7.2 5.9  HGB 8.0* 7.7* 6.0* 10.2* 10.1*  HCT 26.4* 26.1* 20.3* 32.7* 32.5*  PLT 178  --  223 206 188    COAGS: Recent Labs    06/15/19 0742  INR 1.0    BMP: Recent Labs     02/19/19 1105 04/01/19 1248 04/14/19 0835 04/22/19 1309 06/03/19 0942  NA 136 137 140 139 137  K 3.6 3.6 3.9 3.9 3.1*  CL 108 109 107 110 110  CO2 21* 21* 24 21* 20*  GLUCOSE 198* 145* 120* 138* 218*  BUN 19 27* 17 23 22   CALCIUM 8.7* 8.9 8.9 8.8* 8.4*  CREATININE 1.64* 1.80* 1.49* 1.70* 1.49*  GFRNONAA 28* 25*  --  27* 32*  GFRAA 33* 29*  --  32* 37*    LIVER FUNCTION TESTS: Recent Labs    09/26/18 1147 10/13/18 0950 02/02/19 0943 04/14/19 0835  BILITOT 0.4 0.4 0.4 0.4  AST 39 24 42* 18  ALT 26 21 24 13   ALKPHOS 53 50 46 61  PROT 6.9 6.6 5.7* 5.9*  ALBUMIN 3.5 3.8 3.1* 3.7    TUMOR MARKERS: No results for input(s): AFPTM, CEA, CA199, CHROMGRNA in the last 8760 hours.  Assessment and Plan:  Severe anemia, unspecified.  Plan for CT BM ASP AND CORE BX today.  Consent obtained.  Risks and benefits of CT BX was discussed with the patient and/or patient's family including, but not limited to bleeding, infection, damage to adjacent structures or low yield requiring additional tests.  All of the questions were answered and there is agreement to proceed.  Consent signed and in chart.    Thank you for this interesting consult.  I greatly enjoyed meeting Jocelyn Elliott and look forward to participating in their care.  A copy of this report was sent to the requesting provider on this date.  Electronically Signed: Greggory Keen, MD 06/15/2019, 8:59 AM   I spent a total of  30 Minutes   in face to face in clinical consultation, greater than 50% of which was counseling/coordinating care for this elderly patient with severe anemia.

## 2019-06-15 NOTE — Discharge Instructions (Signed)

## 2019-06-15 NOTE — Procedures (Signed)
Severe anemia  S/p CT BM ASP AND CORE BX  No comp Stable ebl min Path pending Full report in pacs

## 2019-06-16 LAB — GLUCOSE, CAPILLARY
Glucose-Capillary: 125 mg/dL — ABNORMAL HIGH (ref 70–99)
Glucose-Capillary: 116 mg/dL — ABNORMAL HIGH (ref 70–99)

## 2019-06-17 ENCOUNTER — Ambulatory Visit: Payer: Medicare Other | Admitting: Internal Medicine

## 2019-06-17 ENCOUNTER — Ambulatory Visit: Payer: Medicare Other

## 2019-06-17 ENCOUNTER — Other Ambulatory Visit: Payer: Medicare Other

## 2019-06-19 ENCOUNTER — Other Ambulatory Visit: Payer: Self-pay

## 2019-06-22 ENCOUNTER — Inpatient Hospital Stay: Payer: Medicare Other

## 2019-06-22 ENCOUNTER — Encounter (HOSPITAL_COMMUNITY): Payer: Self-pay | Admitting: Internal Medicine

## 2019-06-22 ENCOUNTER — Inpatient Hospital Stay (HOSPITAL_BASED_OUTPATIENT_CLINIC_OR_DEPARTMENT_OTHER): Payer: Medicare Other | Admitting: Internal Medicine

## 2019-06-22 ENCOUNTER — Inpatient Hospital Stay: Payer: Medicare Other | Attending: Internal Medicine

## 2019-06-22 ENCOUNTER — Other Ambulatory Visit: Payer: Self-pay

## 2019-06-22 DIAGNOSIS — D649 Anemia, unspecified: Secondary | ICD-10-CM | POA: Diagnosis not present

## 2019-06-22 DIAGNOSIS — D631 Anemia in chronic kidney disease: Secondary | ICD-10-CM | POA: Diagnosis present

## 2019-06-22 DIAGNOSIS — N183 Chronic kidney disease, stage 3 (moderate): Secondary | ICD-10-CM | POA: Insufficient documentation

## 2019-06-22 DIAGNOSIS — D500 Iron deficiency anemia secondary to blood loss (chronic): Secondary | ICD-10-CM

## 2019-06-22 DIAGNOSIS — R0602 Shortness of breath: Secondary | ICD-10-CM | POA: Insufficient documentation

## 2019-06-22 LAB — CBC WITH DIFFERENTIAL/PLATELET
Abs Immature Granulocytes: 0.02 10*3/uL (ref 0.00–0.07)
Basophils Absolute: 0.1 10*3/uL (ref 0.0–0.1)
Basophils Relative: 1 %
Eosinophils Absolute: 0.2 10*3/uL (ref 0.0–0.5)
Eosinophils Relative: 4 %
HCT: 28.8 % — ABNORMAL LOW (ref 36.0–46.0)
Hemoglobin: 9 g/dL — ABNORMAL LOW (ref 12.0–15.0)
Immature Granulocytes: 0 %
Lymphocytes Relative: 28 %
Lymphs Abs: 1.6 10*3/uL (ref 0.7–4.0)
MCH: 28.1 pg (ref 26.0–34.0)
MCHC: 31.3 g/dL (ref 30.0–36.0)
MCV: 90 fL (ref 80.0–100.0)
Monocytes Absolute: 0.5 10*3/uL (ref 0.1–1.0)
Monocytes Relative: 8 %
Neutro Abs: 3.4 10*3/uL (ref 1.7–7.7)
Neutrophils Relative %: 59 %
Platelets: 214 10*3/uL (ref 150–400)
RBC: 3.2 MIL/uL — ABNORMAL LOW (ref 3.87–5.11)
RDW: 16.7 % — ABNORMAL HIGH (ref 11.5–15.5)
WBC: 5.8 10*3/uL (ref 4.0–10.5)
nRBC: 0 % (ref 0.0–0.2)

## 2019-06-22 LAB — BASIC METABOLIC PANEL
Anion gap: 9 (ref 5–15)
BUN: 22 mg/dL (ref 8–23)
CO2: 23 mmol/L (ref 22–32)
Calcium: 9 mg/dL (ref 8.9–10.3)
Chloride: 106 mmol/L (ref 98–111)
Creatinine, Ser: 1.49 mg/dL — ABNORMAL HIGH (ref 0.44–1.00)
GFR calc Af Amer: 37 mL/min — ABNORMAL LOW (ref >60)
GFR calc non Af Amer: 32 mL/min — ABNORMAL LOW (ref >60)
Glucose, Bld: 183 mg/dL — ABNORMAL HIGH (ref 70–99)
Potassium: 3.8 mmol/L (ref 3.5–5.1)
Sodium: 138 mmol/L (ref 135–145)

## 2019-06-22 MED ORDER — EPOETIN ALFA-EPBX 40000 UNIT/ML IJ SOLN
40000.0000 [IU] | Freq: Once | INTRAMUSCULAR | Status: AC
  Administered 2019-06-22: 40000 [IU] via SUBCUTANEOUS
  Filled 2019-06-22: qty 1

## 2019-06-22 NOTE — Progress Notes (Signed)
Pt here for follow up. States is feeling better today.

## 2019-06-22 NOTE — Assessment & Plan Note (Addendum)
#  Severe anemia hemoglobin-nadir 6.  Unclear etiology.  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  Cytogenetics normal.  #Discussed that the etiology still unclear-differential includes low-grade MDS versus reactive/CKD.-we will rule out zinc copper Q79 deficiency/folic acid.  Discussed need for NGS if above work-up is negative.  #Today hemoglobin is 9; proceed with Retacrit.  # CKD stage III-1.49 stable  #Patient was given a copy of her bone marrow biopsy report.  Also discussed the patient's daughter Shirlean Mylar at length.  In agreement.  # DISPOSITION:  # Retacrit today. # in 2 weeks- cbc/b12/folate/copper/zince; HOLD tube; Retacrit # in 4 weeks- MD- labs- cbc/hold tube; Retacrit- Dr.B

## 2019-06-22 NOTE — Progress Notes (Signed)
[place] OFFICE PROGRESS NOTE  Patient Care Team: Einar Pheasant, MD as PCP - General (Internal Medicine) Einar Pheasant, MD (Internal Medicine) Leia Alf, MD (Inactive) as Referring Physician (Internal Medicine) Isaias Cowman, MD (Internal Medicine) Albesa Seen, MD (Unknown Physician Specialty) Philis Kendall, MD (Unknown Physician Specialty) Brendolyn Patty, MD (Specialist) Schnier, Dolores Lory, MD (Vascular Surgery) Leia Alf, MD (Inactive) as Referring Physician (Internal Medicine) Isaias Cowman, MD (Internal Medicine) Albesa Seen, MD (Unknown Physician Specialty) Philis Kendall, MD (Unknown Physician Specialty) Brendolyn Patty, MD (Specialist) Schnier, Dolores Lory, MD (Vascular Surgery)   SUMMARY OF HEMATOLOGIC HISTORY:  # IRON DEFICIENCY ANEMIA- recurrent [? GI blood loss; Dr.Elliot; AVM-capsule]; IV Ferrahem q 27m last colo- 2012/march; EGD-- [id number]  July 2020 bone marrow biopsy-mild dysplastic changes; 40% hypercellularity; cytogenetics-normal.  #Colonoscopy-April 2020 aborted because of cardiac pause.  # CKD stage III-IV; CAD-Dr. PSaralyn Pilar INTERVAL HISTORY:   85-year-old female patient with above history of recurrent iron deficiency anemia unclear etiology is here for follow-up/review the results of her bone marrow biopsy.  Patient's bone marrow biopsy was uneventful.  Denies any blood in stools or black or stools.  Continues to complain of fatigue.  No swelling in the legs.  States her chest discomfort has improved.  States her blood pressures run around systolic 901Sat home/not dizzy.  Review of Systems  Constitutional: Positive for malaise/fatigue. Negative for chills, diaphoresis, fever and weight loss.  HENT: Negative for nosebleeds and sore throat.   Eyes: Negative for double vision.  Respiratory: Positive for shortness of breath. Negative for cough, hemoptysis, sputum production and wheezing.   Cardiovascular:  Negative for palpitations, orthopnea and leg swelling.  Gastrointestinal: Negative for abdominal pain, blood in stool, constipation, diarrhea, heartburn, melena, nausea and vomiting.  Genitourinary: Negative for dysuria, frequency and urgency.  Musculoskeletal: Negative for back pain and joint pain.  Skin: Negative.  Negative for itching and rash.  Neurological: Negative for dizziness, tingling, focal weakness, weakness and headaches.  Endo/Heme/Allergies: Does not bruise/bleed easily.  Psychiatric/Behavioral: Negative for depression. The patient is not nervous/anxious and does not have insomnia.      PAST MEDICAL HISTORY :  Past Medical History:  Diagnosis Date  . Arthritis   . AV malformation of gastrointestinal tract   . Blood in stool   . CAD (coronary artery disease)   . Carotid arterial disease ([place]   . Cataracts, bilateral   . Chronic blood loss anemia   . Chronic cystitis   . Complication of anesthesia    reaction to propofol  . Depression   . Diabetes mellitus ([place]   . GERD (gastroesophageal reflux disease)   . Hyperlipidemia   . Hypertension   . IDA (iron deficiency anemia)   . Stress incontinence     PAST SURGICAL HISTORY :   Past Surgical History:  Procedure Laterality Date  . ABDOMINAL HYSTERECTOMY  1972   ovaries left in place  . APPENDECTOMY  1992  . BACK SURGERY  06/08/2010   L3, L4, L5  . CAROTID ARTERY ANGIOPLASTY  [place]03  [place]   right then left   . CHOLECYSTECTOMY  1993  . COLONOSCOPY WITH PROPOFOL N/A 02/02/2019   Procedure: COLONOSCOPY WITH PROPOFOL;  Surgeon: EManya Silvas MD;  [facility]  Service: Endoscopy;  Laterality: N/A;  . ESOPHAGOGASTRODUODENOSCOPY N/A 02/02/2019   Procedure: ESOPHAGOGASTRODUODENOSCOPY (EGD);  Surgeon: EManya Silvas MD;  [facility]  Service: Endoscopy;  Laterality: N/A;  .  FOOT SURGERY  06/2007   Left  . right carotid artery surgery  01/09/13   Dr.  Delana Meyer @ [facility]  . TOTAL HIP ARTHROPLASTY  05/03/2011   left 12, right 11/07    FAMILY HISTORY :   Family History  Problem Relation Age of Onset  . Hyperlipidemia Father   . Heart disease Father        myocardial infarction  . Hypertension Father        Parent  . Arthritis Other        parent  . Diabetes Other        nephew  . Cervical cancer Sister   . Rectal cancer Sister   . Breast cancer Neg Hx     SOCIAL HISTORY:   Social History   Tobacco Use  . Smoking status: Former Smoker    Types: Cigarettes    Quit date: 11/19/1989    Years since quitting: 29.6  . Smokeless tobacco: Never Used  Substance Use Topics  . Alcohol use: No    Alcohol/week: 0.0 standard drinks  . Drug use: No    ALLERGIES:  is allergic to ciprofloxacin; levaquin [levofloxacin]; propofol; and tequin [gatifloxacin].  MEDICATIONS:  Current Outpatient Medications  Medication Sig Dispense Refill  . aspirin 81 MG tablet Take 81 mg by mouth daily.    . Biotin 5000 MCG CAPS Take 1 capsule by mouth daily.    . cetirizine (ZYRTEC) 10 MG tablet Take 10 mg by mouth daily.    . isosorbide mononitrate (IMDUR) 30 MG 24 hr tablet Take by mouth.    . Omega-3 Fatty Acids (FISH OIL) 1200 MG CAPS Take 1 capsule by mouth daily.    Marland Kitchen omeprazole (PRILOSEC) 40 MG capsule TAKE 1 TABLET 30 MINS BEFORE BREAKFAST AND 1 TABLET 30 MINS BEFORE DINNER.    Marland Kitchen rosuvastatin (CRESTOR) 40 MG tablet Take 1 tablet (40 mg total) by mouth daily. 90 tablet 3  . sucralfate (CARAFATE) 1 g tablet Take 1 tablet by mouth 3 (three) times daily.    . traZODone (DESYREL) 50 MG tablet TAKE 0.5-1 TABLETS (25-50 MG TOTAL) BY MOUTH AT BEDTIME AS NEEDED FOR SLEEP. 90 tablet 0   No current facility-administered medications for this visit.    Facility-Administered Medications Ordered in Other Visits  Medication Dose Route Frequency Provider Last Rate Last Dose  . epoetin alfa-epbx (RETACRIT) injection 40,000 Units  40,000 Units Subcutaneous Once  Charlaine Dalton R, MD        PHYSICAL EXAMINATION:   BP (!) 163/75 (BP Location: Left Arm, Patient Position: Sitting)   Pulse 99   Temp (!) 96.5 F (35.8 C) (Tympanic)   Resp 18   Wt 137 lb 9.6 oz (62.4 kg)   BMI 27.79 kg/m   Filed Weights   06/22/19 1430  Weight: 137 lb 9.6 oz (62.4 kg)    Physical Exam  Constitutional: She is oriented to person, place, and time and well-developed, well-nourished, and in no distress.  Frail-appearing Caucasian female patient.  She is in a wheelchair.  HENT:  Head: Normocephalic and atraumatic.  Mouth/Throat: Oropharynx is clear and moist. No oropharyngeal exudate.  Eyes: Pupils are equal, round, and reactive to light.  Positive for pallor.  Neck: Normal range of motion. Neck supple.  Cardiovascular: Normal rate and regular rhythm.  Pulmonary/Chest: No respiratory distress. She has no wheezes.  Abdominal: Soft. Bowel sounds are normal. She exhibits no distension and no mass. There is no abdominal tenderness. There is no rebound  and no guarding.  Musculoskeletal: Normal range of motion.        General: No tenderness or edema.  Neurological: She is alert and oriented to person, place, and time.  Skin: Skin is warm.  Psychiatric: Affect normal.    LABORATORY DATA:  I have reviewed the data as listed    Component Value Date/Time   NA 138 06/22/2019 1410   NA 143 01/10/2013 0314   K 3.8 06/22/2019 1410   K 4.0 01/10/2013 0314   CL 106 06/22/2019 1410   CL 109 (H) 01/10/2013 0314   CO2 23 06/22/2019 1410   CO2 27 01/10/2013 0314   GLUCOSE 183 (H) 06/22/2019 1410   GLUCOSE 119 (H) 01/10/2013 0314   BUN 22 06/22/2019 1410   BUN 15 01/10/2013 0314   CREATININE 1.49 (H) 06/22/2019 1410   CREATININE 1.03 01/10/2013 0314   CREATININE 1.13 (H) 09/18/2012 0848   CALCIUM 9.0 06/22/2019 1410   CALCIUM 8.1 (L) 01/10/2013 0314   PROT 5.9 (L) 04/14/2019 0835   PROT 7.2 12/20/2011 1521   ALBUMIN 3.7 04/14/2019 0835   ALBUMIN 3.9  12/20/2011 1521   AST 18 04/14/2019 0835   AST 31 12/20/2011 1521   ALT 13 04/14/2019 0835   ALT 37 12/20/2011 1521   ALKPHOS 61 04/14/2019 0835   ALKPHOS 57 12/20/2011 1521   BILITOT 0.4 04/14/2019 0835   BILITOT 0.5 12/20/2011 1521   GFRNONAA 32 (L) 06/22/2019 1410   GFRNONAA 52 (L) 01/10/2013 0314   GFRNONAA 47 (L) 09/18/2012 0848   GFRAA 37 (L) 06/22/2019 1410   GFRAA >60 01/10/2013 0314   GFRAA 54 (L) 09/18/2012 0848    No results found for: SPEP, UPEP  Lab Results  Component Value Date   WBC 5.8 06/22/2019   NEUTROABS 3.4 06/22/2019   HGB 9.0 (L) 06/22/2019   HCT 28.8 (L) 06/22/2019   MCV 90.0 06/22/2019   PLT 214 06/22/2019      Chemistry      Component Value Date/Time   NA 138 06/22/2019 1410   NA 143 01/10/2013 0314   K 3.8 06/22/2019 1410   K 4.0 01/10/2013 0314   CL 106 06/22/2019 1410   CL 109 (H) 01/10/2013 0314   CO2 23 06/22/2019 1410   CO2 27 01/10/2013 0314   BUN 22 06/22/2019 1410   BUN 15 01/10/2013 0314   CREATININE 1.49 (H) 06/22/2019 1410   CREATININE 1.03 01/10/2013 0314   CREATININE 1.13 (H) 09/18/2012 0848      Component Value Date/Time   CALCIUM 9.0 06/22/2019 1410   CALCIUM 8.1 (L) 01/10/2013 0314   ALKPHOS 61 04/14/2019 0835   ALKPHOS 57 12/20/2011 1521   AST 18 04/14/2019 0835   AST 31 12/20/2011 1521   ALT 13 04/14/2019 0835   ALT 37 12/20/2011 1521   BILITOT 0.4 04/14/2019 0835   BILITOT 0.5 12/20/2011 1521         ASSESSMENT & PLAN:   Normocytic anemia #Severe anemia hemoglobin-nadir 6.  Unclear etiology.  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  Cytogenetics normal.  #Discussed that the etiology still unclear-differential includes low-grade MDS versus reactive/CKD.-we will rule out zinc copper K55 deficiency/folic acid.  Discussed need for NGS if above work-up is negative.  #Today hemoglobin is 9; proceed with Retacrit.  # CKD stage III-1.49  stable  #Patient was given a copy of her bone marrow biopsy report.  Also discussed the patient's daughter Shirlean Mylar at length.  In agreement.  # DISPOSITION:  # Retacrit today. # in 2 weeks- cbc/b12/folate/copper/zince; HOLD tube; Retacrit # in 4 weeks- MD- labs- cbc/hold tube; Retacrit- Dr.B      Cammie Sickle, MD 06/22/2019 3:06 PM

## 2019-06-23 LAB — SAMPLE TO BLOOD BANK

## 2019-06-25 ENCOUNTER — Encounter (HOSPITAL_COMMUNITY): Payer: Self-pay | Admitting: Internal Medicine

## 2019-07-03 ENCOUNTER — Other Ambulatory Visit: Payer: Self-pay | Admitting: *Deleted

## 2019-07-03 ENCOUNTER — Other Ambulatory Visit: Payer: Self-pay

## 2019-07-03 DIAGNOSIS — D500 Iron deficiency anemia secondary to blood loss (chronic): Secondary | ICD-10-CM

## 2019-07-03 DIAGNOSIS — D649 Anemia, unspecified: Secondary | ICD-10-CM

## 2019-07-06 ENCOUNTER — Inpatient Hospital Stay: Payer: Medicare Other

## 2019-07-06 ENCOUNTER — Other Ambulatory Visit: Payer: Self-pay

## 2019-07-06 VITALS — BP 97/80 | HR 80

## 2019-07-06 DIAGNOSIS — D649 Anemia, unspecified: Secondary | ICD-10-CM

## 2019-07-06 DIAGNOSIS — N183 Chronic kidney disease, stage 3 (moderate): Secondary | ICD-10-CM | POA: Diagnosis not present

## 2019-07-06 DIAGNOSIS — D500 Iron deficiency anemia secondary to blood loss (chronic): Secondary | ICD-10-CM

## 2019-07-06 LAB — SAMPLE TO BLOOD BANK

## 2019-07-06 LAB — CBC WITH DIFFERENTIAL/PLATELET
Abs Immature Granulocytes: 0.01 10*3/uL (ref 0.00–0.07)
Basophils Absolute: 0.1 10*3/uL (ref 0.0–0.1)
Basophils Relative: 1 %
Eosinophils Absolute: 0.2 10*3/uL (ref 0.0–0.5)
Eosinophils Relative: 3 %
HCT: 27.2 % — ABNORMAL LOW (ref 36.0–46.0)
Hemoglobin: 8.2 g/dL — ABNORMAL LOW (ref 12.0–15.0)
Immature Granulocytes: 0 %
Lymphocytes Relative: 26 %
Lymphs Abs: 1.7 10*3/uL (ref 0.7–4.0)
MCH: 26.7 pg (ref 26.0–34.0)
MCHC: 30.1 g/dL (ref 30.0–36.0)
MCV: 88.6 fL (ref 80.0–100.0)
Monocytes Absolute: 0.5 10*3/uL (ref 0.1–1.0)
Monocytes Relative: 7 %
Neutro Abs: 3.9 10*3/uL (ref 1.7–7.7)
Neutrophils Relative %: 63 %
Platelets: 208 10*3/uL (ref 150–400)
RBC: 3.07 MIL/uL — ABNORMAL LOW (ref 3.87–5.11)
RDW: 16.8 % — ABNORMAL HIGH (ref 11.5–15.5)
WBC: 6.3 10*3/uL (ref 4.0–10.5)
nRBC: 0 % (ref 0.0–0.2)

## 2019-07-06 LAB — VITAMIN B12: Vitamin B-12: 202 pg/mL (ref 180–914)

## 2019-07-06 LAB — FOLATE: Folate: 13 ng/mL (ref >5.9)

## 2019-07-06 MED ORDER — EPOETIN ALFA-EPBX 40000 UNIT/ML IJ SOLN
40000.0000 [IU] | Freq: Once | INTRAMUSCULAR | Status: AC
  Administered 2019-07-06: 40000 [IU] via SUBCUTANEOUS
  Filled 2019-07-06: qty 1

## 2019-07-09 LAB — ZINC: Zinc: 64 ug/dL (ref 56–134)

## 2019-07-17 ENCOUNTER — Other Ambulatory Visit: Payer: Self-pay

## 2019-07-20 ENCOUNTER — Other Ambulatory Visit: Payer: Self-pay | Admitting: *Deleted

## 2019-07-20 ENCOUNTER — Inpatient Hospital Stay (HOSPITAL_BASED_OUTPATIENT_CLINIC_OR_DEPARTMENT_OTHER): Payer: Medicare Other | Admitting: Internal Medicine

## 2019-07-20 ENCOUNTER — Inpatient Hospital Stay: Payer: Medicare Other

## 2019-07-20 ENCOUNTER — Other Ambulatory Visit: Payer: Self-pay

## 2019-07-20 ENCOUNTER — Encounter: Payer: Self-pay | Admitting: Internal Medicine

## 2019-07-20 DIAGNOSIS — D649 Anemia, unspecified: Secondary | ICD-10-CM

## 2019-07-20 DIAGNOSIS — D500 Iron deficiency anemia secondary to blood loss (chronic): Secondary | ICD-10-CM

## 2019-07-20 DIAGNOSIS — N183 Chronic kidney disease, stage 3 unspecified: Secondary | ICD-10-CM

## 2019-07-20 DIAGNOSIS — D631 Anemia in chronic kidney disease: Secondary | ICD-10-CM | POA: Diagnosis not present

## 2019-07-20 LAB — CBC WITH DIFFERENTIAL/PLATELET
Abs Immature Granulocytes: 0.02 10*3/uL (ref 0.00–0.07)
Basophils Absolute: 0.1 10*3/uL (ref 0.0–0.1)
Basophils Relative: 1 %
Eosinophils Absolute: 0.2 10*3/uL (ref 0.0–0.5)
Eosinophils Relative: 4 %
HCT: 24.8 % — ABNORMAL LOW (ref 36.0–46.0)
Hemoglobin: 7.3 g/dL — ABNORMAL LOW (ref 12.0–15.0)
Immature Granulocytes: 0 %
Lymphocytes Relative: 24 %
Lymphs Abs: 1.5 10*3/uL (ref 0.7–4.0)
MCH: 25.6 pg — ABNORMAL LOW (ref 26.0–34.0)
MCHC: 29.4 g/dL — ABNORMAL LOW (ref 30.0–36.0)
MCV: 87 fL (ref 80.0–100.0)
Monocytes Absolute: 0.4 10*3/uL (ref 0.1–1.0)
Monocytes Relative: 7 %
Neutro Abs: 3.8 10*3/uL (ref 1.7–7.7)
Neutrophils Relative %: 64 %
Platelets: 236 10*3/uL (ref 150–400)
RBC: 2.85 MIL/uL — ABNORMAL LOW (ref 3.87–5.11)
RDW: 16.8 % — ABNORMAL HIGH (ref 11.5–15.5)
WBC: 6 10*3/uL (ref 4.0–10.5)
nRBC: 0 % (ref 0.0–0.2)

## 2019-07-20 LAB — SAMPLE TO BLOOD BANK

## 2019-07-20 LAB — PREPARE RBC (CROSSMATCH)

## 2019-07-20 MED ORDER — EPOETIN ALFA-EPBX 40000 UNIT/ML IJ SOLN
40000.0000 [IU] | Freq: Once | INTRAMUSCULAR | Status: AC
  Administered 2019-07-20: 40000 [IU] via SUBCUTANEOUS
  Filled 2019-07-20: qty 1

## 2019-07-20 NOTE — Assessment & Plan Note (Addendum)
#  Severe anemia hemoglobin-nadir 6.  Unclear etiology question CKD versus MDS.  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  Cytogenetics normal.  #Discussed that the etiology still unclear-I would recommend further evaluation with NGS/foundation 1 heme to be ordered in 2 weeks.  Patient agreement.  #Today hemoglobin is 7.3; proceed with Retacrit. Also- PRBC trasnfusion tomorrow.  Given the lack of significant improvement in the hemoglobin while on Retacrit-would recommend adding IV iron in 2 weeks.  # CKD stage III-1.49 stable  # Also discussed the patient's daughter Shirlean Mylar at length.  In agreement.  # DISPOSITION:  # Retacrit today. # PRBC tomorrow # in 2 weeks- cbc/ copper; Foundation One Hem;HOLD tube; Ferrahem infusion # in 4 weeks- MD- labs- cbc/hold tube; Retacrit- Dr.B

## 2019-07-21 ENCOUNTER — Other Ambulatory Visit: Payer: Self-pay

## 2019-07-21 ENCOUNTER — Inpatient Hospital Stay: Payer: Medicare Other | Attending: Internal Medicine

## 2019-07-21 DIAGNOSIS — I2510 Atherosclerotic heart disease of native coronary artery without angina pectoris: Secondary | ICD-10-CM | POA: Diagnosis not present

## 2019-07-21 DIAGNOSIS — N183 Chronic kidney disease, stage 3 (moderate): Secondary | ICD-10-CM | POA: Diagnosis present

## 2019-07-21 DIAGNOSIS — D631 Anemia in chronic kidney disease: Secondary | ICD-10-CM | POA: Diagnosis present

## 2019-07-21 DIAGNOSIS — D500 Iron deficiency anemia secondary to blood loss (chronic): Secondary | ICD-10-CM

## 2019-07-21 DIAGNOSIS — R0602 Shortness of breath: Secondary | ICD-10-CM | POA: Diagnosis not present

## 2019-07-21 DIAGNOSIS — D649 Anemia, unspecified: Secondary | ICD-10-CM

## 2019-07-21 DIAGNOSIS — R5383 Other fatigue: Secondary | ICD-10-CM | POA: Insufficient documentation

## 2019-07-21 MED ORDER — DIPHENHYDRAMINE HCL 25 MG PO CAPS
25.0000 mg | ORAL_CAPSULE | Freq: Once | ORAL | Status: AC
  Administered 2019-07-21: 10:00:00 25 mg via ORAL
  Filled 2019-07-21: qty 1

## 2019-07-21 MED ORDER — SODIUM CHLORIDE 0.9% IV SOLUTION
250.0000 mL | Freq: Once | INTRAVENOUS | Status: AC
  Administered 2019-07-21: 10:00:00 250 mL via INTRAVENOUS
  Filled 2019-07-21: qty 250

## 2019-07-21 MED ORDER — ACETAMINOPHEN 325 MG PO TABS
650.0000 mg | ORAL_TABLET | Freq: Once | ORAL | Status: AC
  Administered 2019-07-21: 10:00:00 650 mg via ORAL
  Filled 2019-07-21: qty 2

## 2019-07-21 NOTE — Progress Notes (Signed)
[facility] OFFICE PROGRESS NOTE  Patient Care Team: Einar Pheasant, MD as PCP - General (Internal Medicine) Einar Pheasant, MD (Internal Medicine) Leia Alf, MD (Inactive) as Referring Physician (Internal Medicine) Isaias Cowman, MD (Internal Medicine) Albesa Seen, MD (Unknown Physician Specialty) Philis Kendall, MD (Unknown Physician Specialty) Brendolyn Patty, MD (Specialist) Schnier, Dolores Lory, MD (Vascular Surgery) Leia Alf, MD (Inactive) as Referring Physician (Internal Medicine) Isaias Cowman, MD (Internal Medicine) Albesa Seen, MD (Unknown Physician Specialty) Philis Kendall, MD (Unknown Physician Specialty) Brendolyn Patty, MD (Specialist) Schnier, Dolores Lory, MD (Vascular Surgery)   SUMMARY OF HEMATOLOGIC HISTORY:  # IRON DEFICIENCY ANEMIA- recurrent [? GI blood loss; Dr.Elliot; AVM-capsule]; IV Ferrahem q 82m last colo- 2012/march; EGD-- [id number]  July 2020 bone marrow biopsy-mild dysplastic changes; 40% hypercellularity; cytogenetics-normal.  #Colonoscopy-April 2020 aborted because of cardiac pause.  # CKD stage III-IV; CAD-Dr. PSaralyn Pilar INTERVAL HISTORY:   85-year-old female patient with above history of recurrent iron deficiency anemia unclear etiology -related to CKD is here for follow-up.  Patient continues to be fatigued.  Complains of shortness of breath on exertion.  Denies being dizzy at home.  No falls.  No blood in stools or black her stools.  Review of Systems  Constitutional: Positive for malaise/fatigue. Negative for chills, diaphoresis, fever and weight loss.  HENT: Negative for nosebleeds and sore throat.   Eyes: Negative for double vision.  Respiratory: Positive for shortness of breath. Negative for cough, hemoptysis, sputum production and wheezing.   Cardiovascular: Negative for palpitations, orthopnea and leg swelling.  Gastrointestinal: Negative for abdominal pain, blood in stool, constipation,  diarrhea, heartburn, melena, nausea and vomiting.  Genitourinary: Negative for dysuria, frequency and urgency.  Musculoskeletal: Negative for back pain and joint pain.  Skin: Negative.  Negative for itching and rash.  Neurological: Negative for dizziness, tingling, focal weakness, weakness and headaches.  Endo/Heme/Allergies: Does not bruise/bleed easily.  Psychiatric/Behavioral: Negative for depression. The patient is not nervous/anxious and does not have insomnia.      PAST MEDICAL HISTORY :  Past Medical History:  Diagnosis Date  . Arthritis   . AV malformation of gastrointestinal tract   . Blood in stool   . CAD (coronary artery disease)   . [place]   . Cataracts, bilateral   . Chronic blood loss anemia   . Chronic cystitis   . Complication of anesthesia    reaction to propofol  . Depression   . Diabetes [place]   . GERD (gastroesophageal reflux disease)   . Hyperlipidemia   . Hypertension   . IDA (iron deficiency anemia)   . Stress incontinence     PAST SURGICAL HISTORY :   Past Surgical History:  Procedure Laterality Date  . ABDOMINAL HYSTERECTOMY  1972   ovaries left in place  . APPENDECTOMY  1992  . BACK SURGERY  06/08/2010   L3, L4, L5  . CAROTID [place]OP[place]Y  [place]03  [place]   right then left   . CHOLECYSTECTOMY  1993  . COLONOSCOPY WITH PROPOFOL N/A 02/02/2019   Procedure: COLONOSCOPY WITH PROPOFOL;  Surgeon: EManya Silvas MD;  [facility];  Service: Endoscopy;  Laterality: N/A;  . ESOPHAGOGASTRODUODENOSCOPY N/A 02/02/2019   Procedure: ESOPHAGOGASTRODUODENOSCOPY (EGD);  Surgeon: EManya Silvas MD;  [facility];  Service: Endoscopy;  Laterality: N/A;  . FOOT SURGERY  06/2007   Left  . right carotid artery surgery  01/09/13   Dr. SDelana Meyer@  [facility]  . TOTAL HIP ARTHROPLASTY  05/03/2011   left 12, right 11/07    FAMILY HISTORY :   Family History  Problem Relation  Age of Onset  . Hyperlipidemia Father   . Heart disease Father        myocardial infarction  . Hypertension Father        Parent  . Arthritis Other        parent  . Diabetes Other        nephew  . Cervical cancer Sister   . Rectal cancer Sister   . Breast cancer Neg Hx     SOCIAL HISTORY:   Social History   Tobacco Use  . Smoking status: Former Smoker    Types: Cigarettes    Quit date: 11/19/1989    Years since quitting: 29.6  . Smokeless tobacco: Never Used  Substance Use Topics  . Alcohol use: No    Alcohol/week: 0.0 standard drinks  . Drug use: No    ALLERGIES:  is allergic to ciprofloxacin; levaquin [levofloxacin]; propofol; and tequin [gatifloxacin].  MEDICATIONS:  Current Outpatient Medications  Medication Sig Dispense Refill  . aspirin 81 MG tablet Take 81 mg by mouth daily.    . Biotin 5000 MCG CAPS Take 1 capsule by mouth daily.    . cetirizine (ZYRTEC) 10 MG tablet Take 10 mg by mouth daily.    . isosorbide mononitrate (IMDUR) 30 MG 24 hr tablet Take by mouth.    . Omega-3 Fatty Acids (FISH OIL) 1200 MG CAPS Take 1 capsule by mouth daily.    Marland Kitchen omeprazole (PRILOSEC) 40 MG capsule TAKE 1 TABLET 30 MINS BEFORE BREAKFAST AND 1 TABLET 30 MINS BEFORE DINNER.    Marland Kitchen rosuvastatin (CRESTOR) 40 MG tablet Take 1 tablet (40 mg total) by mouth daily. 90 tablet 3  . sucralfate (CARAFATE) 1 g tablet Take 1 tablet by mouth 3 (three) times daily.    . traZODone (DESYREL) 50 MG tablet TAKE 0.5-1 TABLETS (25-50 MG TOTAL) BY MOUTH AT BEDTIME AS NEEDED FOR SLEEP. 90 tablet 0   No current facility-administered medications for this visit.     PHYSICAL EXAMINATION:   BP (!) 144/66   Pulse 91   Temp (!) 97.2 F (36.2 C) (Tympanic)   Resp 20   Ht _0  (1.499 m)   BMI 27.79 kg/m   There were no vitals filed for this visit.  Physical Exam  Constitutional: She is oriented to person, place, and time and well-developed, well-nourished, and in no distress.  Frail-appearing  Caucasian female patient.  She is in a wheelchair.  HENT:  Head: Normocephalic and atraumatic.  Mouth/Throat: Oropharynx is clear and moist. No oropharyngeal exudate.  Eyes: Pupils are equal, round, and reactive to light.  Positive for pallor.  Neck: Normal range of motion. Neck supple.  Cardiovascular: Normal rate and regular rhythm.  Pulmonary/Chest: No respiratory distress. She has no wheezes.  Decreased air entry at the bases.  Abdominal: Soft. Bowel sounds are normal. She exhibits no distension and no mass. There is no abdominal tenderness. There is no rebound and no guarding.  Musculoskeletal: Normal range of motion.        General: No tenderness or edema.  Neurological: She is alert and oriented to person, place, and time.  Skin: Skin is warm.  Psychiatric: Affect normal.    LABORATORY DATA:  I have reviewed the data as listed    Component Value Date/Time   NA 138 06/22/2019 1410  NA 143 01/10/2013 0314   K 3.8 06/22/2019 1410   K 4.0 01/10/2013 0314   CL 106 06/22/2019 1410   CL 109 (H) 01/10/2013 0314   CO2 23 06/22/2019 1410   CO2 27 01/10/2013 0314   GLUCOSE 183 (H) 06/22/2019 1410   GLUCOSE 119 (H) 01/10/2013 0314   BUN 22 06/22/2019 1410   BUN 15 01/10/2013 0314   CREATININE 1.49 (H) 06/22/2019 1410   CREATININE 1.03 01/10/2013 0314   CREATININE 1.13 (H) 09/18/2012 0848   CALCIUM 9.0 06/22/2019 1410   CALCIUM 8.1 (L) 01/10/2013 0314   PROT 5.9 (L) 04/14/2019 0835   PROT 7.2 12/20/2011 1521   ALBUMIN 3.7 04/14/2019 0835   ALBUMIN 3.9 12/20/2011 1521   AST 18 04/14/2019 0835   AST 31 12/20/2011 1521   ALT 13 04/14/2019 0835   ALT 37 12/20/2011 1521   ALKPHOS 61 04/14/2019 0835   ALKPHOS 57 12/20/2011 1521   BILITOT 0.4 04/14/2019 0835   BILITOT 0.5 12/20/2011 1521   GFRNONAA 32 (L) 06/22/2019 1410   GFRNONAA 52 (L) 01/10/2013 0314   GFRNONAA 47 (L) 09/18/2012 0848   GFRAA 37 (L) 06/22/2019 1410   GFRAA >60 01/10/2013 0314   GFRAA 54 (L) 09/18/2012  0848    No results found for: SPEP, UPEP  Lab Results  Component Value Date   WBC 6.0 07/20/2019   NEUTROABS 3.8 07/20/2019   HGB 7.3 (L) 07/20/2019   HCT 24.8 (L) 07/20/2019   MCV 87.0 07/20/2019   PLT 236 07/20/2019      Chemistry      Component Value Date/Time   NA 138 06/22/2019 1410   NA 143 01/10/2013 0314   K 3.8 06/22/2019 1410   K 4.0 01/10/2013 0314   CL 106 06/22/2019 1410   CL 109 (H) 01/10/2013 0314   CO2 23 06/22/2019 1410   CO2 27 01/10/2013 0314   BUN 22 06/22/2019 1410   BUN 15 01/10/2013 0314   CREATININE 1.49 (H) 06/22/2019 1410   CREATININE 1.03 01/10/2013 0314   CREATININE 1.13 (H) 09/18/2012 0848      Component Value Date/Time   CALCIUM 9.0 06/22/2019 1410   CALCIUM 8.1 (L) 01/10/2013 0314   ALKPHOS 61 04/14/2019 0835   ALKPHOS 57 12/20/2011 1521   AST 18 04/14/2019 0835   AST 31 12/20/2011 1521   ALT 13 04/14/2019 0835   ALT 37 12/20/2011 1521   BILITOT 0.4 04/14/2019 0835   BILITOT 0.5 12/20/2011 1521         ASSESSMENT & PLAN:   CKD (chronic kidney disease) stage 3, GFR 30-59 ml/min (HCC) #Severe anemia hemoglobin-nadir 6.  Unclear etiology question CKD versus MDS.  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  Cytogenetics normal.  #Discussed that the etiology still unclear-I would recommend further evaluation with NGS/foundation 1 heme to be ordered in 2 weeks.  Patient agreement.  #Today hemoglobin is 7.3; proceed with Retacrit. Also- PRBC trasnfusion tomorrow.  Given the lack of significant improvement in the hemoglobin while on Retacrit-would recommend adding IV iron in 2 weeks.  # CKD stage III-1.49 stable  # Also discussed the patient's daughter Shirlean Mylar at length.  In agreement.  # DISPOSITION:  # Retacrit today. # PRBC tomorrow # in 2 weeks- cbc/ copper; Foundation One Hem;HOLD tube; Ferrahem infusion # in 4 weeks- MD- labs- cbc/hold tube; Retacrit- Dr.B     Cammie Sickle, MD 07/21/2019 8:07 AM

## 2019-07-22 LAB — TYPE AND SCREEN
ABO/RH(D): A POS
Antibody Screen: POSITIVE
Donor AG Type: NEGATIVE
Unit division: 0

## 2019-07-22 LAB — BPAM RBC
Blood Product Expiration Date: 202009232359
ISSUE DATE / TIME: 202009011046
Unit Type and Rh: 6200

## 2019-07-28 ENCOUNTER — Ambulatory Visit: Payer: Medicare Other | Admitting: Internal Medicine

## 2019-07-28 ENCOUNTER — Other Ambulatory Visit: Payer: Self-pay

## 2019-07-28 VITALS — BP 130/70 | HR 84 | Temp 97.4°F | Resp 16 | Ht 59.0 in | Wt 136.4 lb

## 2019-07-28 DIAGNOSIS — I2510 Atherosclerotic heart disease of native coronary artery without angina pectoris: Secondary | ICD-10-CM | POA: Diagnosis not present

## 2019-07-28 DIAGNOSIS — D631 Anemia in chronic kidney disease: Secondary | ICD-10-CM

## 2019-07-28 DIAGNOSIS — N183 Chronic kidney disease, stage 3 unspecified: Secondary | ICD-10-CM

## 2019-07-28 DIAGNOSIS — I779 Disorder of arteries and arterioles, unspecified: Secondary | ICD-10-CM

## 2019-07-28 DIAGNOSIS — Z1239 Encounter for other screening for malignant neoplasm of breast: Secondary | ICD-10-CM

## 2019-07-28 DIAGNOSIS — I4891 Unspecified atrial fibrillation: Secondary | ICD-10-CM | POA: Diagnosis not present

## 2019-07-28 DIAGNOSIS — I739 Peripheral vascular disease, unspecified: Secondary | ICD-10-CM

## 2019-07-28 DIAGNOSIS — E7800 Pure hypercholesterolemia, unspecified: Secondary | ICD-10-CM | POA: Diagnosis not present

## 2019-07-28 DIAGNOSIS — Z23 Encounter for immunization: Secondary | ICD-10-CM | POA: Diagnosis not present

## 2019-07-28 DIAGNOSIS — E1159 Type 2 diabetes mellitus with other circulatory complications: Secondary | ICD-10-CM

## 2019-07-28 DIAGNOSIS — I10 Essential (primary) hypertension: Secondary | ICD-10-CM

## 2019-07-28 LAB — BASIC METABOLIC PANEL
BUN: 18 mg/dL (ref 6–23)
CO2: 24 mEq/L (ref 19–32)
Calcium: 8.9 mg/dL (ref 8.4–10.5)
Chloride: 108 mEq/L (ref 96–112)
Creatinine, Ser: 1.31 mg/dL — ABNORMAL HIGH (ref 0.40–1.20)
GFR: 38.58 mL/min — ABNORMAL LOW (ref >60.00)
Glucose, Bld: 129 mg/dL — ABNORMAL HIGH (ref 70–99)
Potassium: 4.2 mEq/L (ref 3.5–5.1)
Sodium: 140 mEq/L (ref 135–145)

## 2019-07-28 LAB — HEPATIC FUNCTION PANEL
ALT: 11 U/L (ref 0–35)
AST: 14 U/L (ref 0–37)
Albumin: 3.9 g/dL (ref 3.5–5.2)
Alkaline Phosphatase: 78 U/L (ref 39–117)
Bilirubin, Direct: 0.1 mg/dL (ref 0.0–0.3)
Total Bilirubin: 0.5 mg/dL (ref 0.2–1.2)
Total Protein: 6.8 g/dL (ref 6.0–8.3)

## 2019-07-28 LAB — LIPID PANEL
Cholesterol: 134 mg/dL (ref 0–200)
HDL: 51.1 mg/dL (ref >39.00)
LDL Cholesterol: 47 mg/dL (ref 0–99)
NonHDL: 83.35
Total CHOL/HDL Ratio: 3
Triglycerides: 184 mg/dL — ABNORMAL HIGH (ref 0.0–149.0)
VLDL: 36.8 mg/dL (ref 0.0–40.0)

## 2019-07-28 LAB — HEMOGLOBIN A1C: Hgb A1c MFr Bld: 6.9 % — ABNORMAL HIGH (ref 4.6–6.5)

## 2019-07-28 NOTE — Progress Notes (Signed)
Patient ID: Jocelyn Elliott, female   DOB: [birth year], 85 y.o.   MRN: [id number]   Subjective:    Patient ID: Jocelyn Elliott, female    DOB: 1932/09/27, 85 y.o.   MRN: [id number]  HPI  Patient here for a scheduled follow up.  She has been seeing dr Rogue Bussing for f/u severe anemia.  Still unclear etiology.  Question CKD versus MDS.  Had bone marrow biopsy.  Cytogenetics normal.  Receiving retacrit.  Also receiving intermittent PRBC transfusion.  Has not significantly improved.  Plans to start IV iron.  She recently saw cardiology for chest pain and sob.  They added isosorbide.  No further chest pain now.  Breathing stable.  No acid reflux.  No abdominal pain.  Bowels moving.  No blood.  Sugars and blood pressures doing well.     Past Medical History:  Diagnosis Date  . Arthritis   . AV malformation of gastrointestinal tract   . Blood in stool   . CAD (coronary artery disease)   . Carotid arterial disease ([place])   . Cataracts, bilateral   . Chronic blood loss anemia   . Chronic cystitis   . Complication of anesthesia    reaction to propofol  . Depression   . Diabetes mellitus ([place])   . GERD (gastroesophageal reflux disease)   . Hyperlipidemia   . Hypertension   . IDA (iron deficiency anemia)   . Stress incontinence    Past Surgical History:  Procedure Laterality Date  . ABDOMINAL HYSTERECTOMY  1972   ovaries left in place  . APPENDECTOMY  1992  . BACK SURGERY  06/08/2010   L3, L4, L5  . CAROTID ARTERY ANGIOPLASTY  11/03  . [place], 89   right then left   . CHOLECYSTECTOMY  1993  . COLONOSCOPY WITH PROPOFOL N/A 02/02/2019   Procedure: COLONOSCOPY WITH PROPOFOL;  Surgeon: Manya Silvas, MD;  Location: [facility]COPY;  Service: Endoscopy;  Laterality: N/A;  . ESOPHAGOGASTRODUODENOSCOPY N/A 02/02/2019   Procedure: ESOPHAGOGASTRODUODENOSCOPY (EGD);  Surgeon: Manya Silvas, MD;  Location[facility];  Service: Endoscopy;  Laterality: N/A;  . FOOT  SURGERY  06/2007   Left  . right carotid artery surgery  01/09/13   Dr. Delana Meyer @ [facility]  . TOTAL HIP ARTHROPLASTY  05/03/2011   left 12, right 11/07   Family History  Problem Relation Age of Onset  . Hyperlipidemia Father   . Heart disease Father        myocardial infarction  . Hypertension Father        Parent  . Arthritis Other        parent  . Diabetes Other        nephew  . Cervical cancer Sister   . Rectal cancer Sister   . Breast cancer Neg Hx    Social History   Socioeconomic History  . Marital status: Widowed    Spouse name: Not on file  . Number of children: 1  . Years of education: 76  . Highest education level: Not on file  Occupational History  . Occupation: Retired  Scientific laboratory technician  . Financial resource strain: Not on file  . Food insecurity    Worry: Not on file    Inability: Not on file  . Transportation needs    Medical: Not on file    Non-medical: Not on file  Tobacco Use  . Smoking status: Former Smoker    Types: Cigarettes    Quit  date: 11/19/1989    Years since quitting: 29.7  . Smokeless tobacco: Never Used  Substance and Sexual Activity  . Alcohol use: No    Alcohol/week: 0.0 standard drinks  . Drug use: No  . Sexual activity: Not Currently  Lifestyle  . Physical activity    Days per week: Not on file    Minutes per session: Not on file  . Stress: Not on file  Relationships  . Social Herbalist on phone: Not on file    Gets together: Not on file    Attends religious service: Not on file    Active member of club or organization: Not on file    Attends meetings of clubs or organizations: Not on file    Relationship status: Not on file  Other Topics Concern  . Not on file  Social History Narrative   Regular exercise-no   Caffeine Use-yes    Outpatient Encounter Medications as of 07/28/2019  Medication Sig  . aspirin 81 MG tablet Take 81 mg by mouth daily.  . Biotin 5000 MCG CAPS Take 1 capsule by mouth daily.  .  cetirizine (ZYRTEC) 10 MG tablet Take 10 mg by mouth daily.  . isosorbide mononitrate (IMDUR) 30 MG 24 hr tablet Take by mouth.  . Omega-3 Fatty Acids (FISH OIL) 1200 MG CAPS Take 1 capsule by mouth daily.  Marland Kitchen omeprazole (PRILOSEC) 40 MG capsule TAKE 1 TABLET 30 MINS BEFORE BREAKFAST AND 1 TABLET 30 MINS BEFORE DINNER.  Marland Kitchen sucralfate (CARAFATE) 1 g tablet Take 1 tablet by mouth 3 (three) times daily.  . traZODone (DESYREL) 50 MG tablet TAKE 0.5-1 TABLETS (25-50 MG TOTAL) BY MOUTH AT BEDTIME AS NEEDED FOR SLEEP.  . [DISCONTINUED] rosuvastatin (CRESTOR) 40 MG tablet Take 1 tablet (40 mg total) by mouth daily.   No facility-administered encounter medications on file as of 07/28/2019.     Review of Systems  Constitutional: Negative for appetite change and unexpected weight change.  HENT: Negative for congestion and sinus pressure.   Respiratory: Negative for cough, chest tightness and shortness of breath.   Cardiovascular: Negative for chest pain, palpitations and leg swelling.  Gastrointestinal: Negative for abdominal pain, diarrhea, nausea and vomiting.  Genitourinary: Negative for difficulty urinating and dysuria.  Musculoskeletal: Negative for joint swelling and myalgias.  Skin: Negative for color change and rash.  Neurological: Negative for dizziness, light-headedness and headaches.  Psychiatric/Behavioral: Negative for agitation and dysphoric mood.       Objective:    Physical Exam Constitutional:      General: She is not in acute distress.    Appearance: Normal appearance.  HENT:     Right Ear: External ear normal.     Left Ear: External ear normal.  Eyes:     General: No scleral icterus.       Right eye: No discharge.        Left eye: No discharge.     Conjunctiva/sclera: Conjunctivae normal.  Neck:     Musculoskeletal: Neck supple.     Thyroid: No thyromegaly.  Cardiovascular:     Rate and Rhythm: Regular rhythm.  Pulmonary:     Effort: No respiratory distress.      Breath sounds: Normal breath sounds. No wheezing.  Abdominal:     General: Bowel sounds are normal.     Palpations: Abdomen is soft.     Tenderness: There is no abdominal tenderness.  Musculoskeletal:        General: No swelling  or tenderness.  Lymphadenopathy:     Cervical: No cervical adenopathy.  Skin:    Findings: No erythema or rash.  Neurological:     Mental Status: She is alert.  Psychiatric:        Mood and Affect: Mood normal.        Behavior: Behavior normal.     BP 130/70   Pulse 84   Temp (!) 97.4 F (36.3 C) (Temporal)   Resp 16   Ht 4' 11"  (1.499 m)   Wt 136 lb 6.4 oz (61.9 kg)   SpO2 97%   BMI 27.55 kg/m  Wt Readings from Last 3 Encounters:  07/28/19 136 lb 6.4 oz (61.9 kg)  06/22/19 137 lb 9.6 oz (62.4 kg)  06/15/19 136 lb (61.7 kg)     Lab Results  Component Value Date   WBC 6.0 07/20/2019   HGB 7.3 (L) 07/20/2019   HCT 24.8 (L) 07/20/2019   PLT 236 07/20/2019   GLUCOSE 129 (H) 07/28/2019   CHOL 134 07/28/2019   TRIG 184.0 (H) 07/28/2019   HDL 51.10 07/28/2019   LDLDIRECT 51.0 04/14/2019   LDLCALC 47 07/28/2019   ALT 11 07/28/2019   AST 14 07/28/2019   NA 140 07/28/2019   K 4.2 07/28/2019   CL 108 07/28/2019   CREATININE 1.31 (H) 07/28/2019   BUN 18 07/28/2019   CO2 24 07/28/2019   TSH 1.60 10/13/2018   INR 1.0 06/15/2019   HGBA1C 6.9 (H) 07/28/2019   MICROALBUR 9.3 (H) 10/13/2018    Ct Bone Marrow Biopsy & Aspiration  Result Date: 06/15/2019 INDICATION: Severe anemia requiring transfusion EXAM: CT GUIDED RIGHT ILIAC BONE MARROW ASPIRATION AND CORE BIOPSY Date:  06/15/2019 06/15/2019 10:01 am Radiologist:  Jerilynn Mages. Daryll Brod, MD Guidance:  CT FLUOROSCOPY TIME:  Fluoroscopy Time: None. MEDICATIONS: 1% lidocaine local ANESTHESIA/SEDATION: One mg IV Versed; 25 mcg IV Fentanyl Moderate Sedation Time:  23 minutes The patient was continuously monitored during the procedure by the interventional radiology nurse under my direct supervision.  CONTRAST:  None. COMPLICATIONS: None PROCEDURE: Informed consent was obtained from the patient following explanation of the procedure, risks, benefits and alternatives. The patient understands, agrees and consents for the procedure. All questions were addressed. A time out was performed. The patient was positioned prone and non-contrast localization CT was performed of the pelvis to demonstrate the iliac marrow spaces. Maximal barrier sterile technique utilized including caps, mask, sterile gowns, sterile gloves, large sterile drape, hand hygiene, and Betadine prep. Under sterile conditions and local anesthesia, an 11 gauge coaxial bone biopsy needle was advanced into the right iliac marrow space. Needle position was confirmed with CT imaging. Initially, bone marrow aspiration was performed. Next, the 11 gauge outer cannula was utilized to obtain a right iliac bone marrow core biopsy. Needle was removed. Hemostasis was obtained with compression. The patient tolerated the procedure well. Samples were prepared with the cytotechnologist. No immediate complications. IMPRESSION: CT guided right iliac bone marrow aspiration and core biopsy. Electronically Signed   By: Jerilynn Mages.  Shick M.D.   On: 06/15/2019 10:09       Assessment & Plan:   Problem List Items Addressed This Visit    Afib [facility])    Had brief episode after attempted colonoscopy.  Saw cardiology if follow.  Had holter and echo.  No afib.  Follow.        Anemia of chronic kidney failure, stage 3 (moderate) (HCC)    Has anemia.  Still unclear etiology.  Being  followed by Dr Rogue Bussing.  Intermittent transfusions.  Planning to add IV iron.  Follow.        CAD (coronary artery disease)    Has recently been evaluated by cardiology.  Isosorbide added.  No chest pain now.  Follow.        Carotid arterial disease (HCC)    Followed by [facility].       CKD (chronic kidney disease) stage 3, GFR 30-59 ml/min (HCC)    Avoid antiinflammatories.  Follow metabolic  panel.        Diabetes mellitus ([place])    Low carb diet and exercise.  Follow met b and a1c.       Relevant Orders   Hemoglobin A1c (Completed)   Basic metabolic panel (Completed)   Hypercholesteremia    On crestor.  Low cholesterol diet and exercise.  Follow lipid panel and liver function tests.        Relevant Orders   Hepatic function panel (Completed)   Lipid panel (Completed)   Hypertension    Blood pressure under good control.  Continue same medication regimen.  Follow pressures.  Follow metabolic panel.         Other Visit Diagnoses    Breast cancer screening    -  Primary   Relevant Orders   MM 3D SCREEN BREAST BILATERAL   Need for immunization against influenza       Relevant Orders   Flu Vaccine QUAD High Dose(Fluad) (Completed)       Einar Pheasant, MD

## 2019-07-29 ENCOUNTER — Other Ambulatory Visit: Payer: Self-pay | Admitting: Internal Medicine

## 2019-07-30 ENCOUNTER — Encounter (INDEPENDENT_AMBULATORY_CARE_PROVIDER_SITE_OTHER): Payer: Medicare Other

## 2019-07-30 ENCOUNTER — Ambulatory Visit (INDEPENDENT_AMBULATORY_CARE_PROVIDER_SITE_OTHER): Payer: Medicare Other | Admitting: Vascular Surgery

## 2019-07-30 ENCOUNTER — Encounter: Payer: Self-pay | Admitting: *Deleted

## 2019-08-01 ENCOUNTER — Encounter: Payer: Self-pay | Admitting: Internal Medicine

## 2019-08-01 NOTE — Assessment & Plan Note (Signed)
Followed by [facility].   

## 2019-08-01 NOTE — Assessment & Plan Note (Signed)
Had brief episode after attempted colonoscopy.  Saw cardiology if follow.  Had holter and echo.  No afib.  Follow.

## 2019-08-01 NOTE — Assessment & Plan Note (Signed)
Blood pressure under good control.  Continue same medication regimen.  Follow pressures.  Follow metabolic panel.   

## 2019-08-01 NOTE — Assessment & Plan Note (Signed)
Has recently been evaluated by cardiology.  Isosorbide added.  No chest pain now.  Follow.

## 2019-08-01 NOTE — Assessment & Plan Note (Signed)
Low carb diet and exercise.  Follow met b and a1c.  

## 2019-08-01 NOTE — Assessment & Plan Note (Signed)
On crestor.  Low cholesterol diet and exercise.  Follow lipid panel and liver function tests.   

## 2019-08-01 NOTE — Assessment & Plan Note (Signed)
Has anemia.  Still unclear etiology.  Being followed by Dr Rogue Bussing.  Intermittent transfusions.  Planning to add IV iron.  Follow.

## 2019-08-01 NOTE — Assessment & Plan Note (Signed)
Avoid antiinflammatories.  Follow metabolic panel.  

## 2019-08-03 ENCOUNTER — Other Ambulatory Visit: Payer: Self-pay | Admitting: Internal Medicine

## 2019-08-03 ENCOUNTER — Ambulatory Visit: Payer: Medicare Other

## 2019-08-03 ENCOUNTER — Other Ambulatory Visit: Payer: Medicare Other

## 2019-08-04 ENCOUNTER — Other Ambulatory Visit: Payer: Self-pay | Admitting: *Deleted

## 2019-08-04 DIAGNOSIS — D500 Iron deficiency anemia secondary to blood loss (chronic): Secondary | ICD-10-CM

## 2019-08-05 ENCOUNTER — Other Ambulatory Visit: Payer: Self-pay | Admitting: Internal Medicine

## 2019-08-05 ENCOUNTER — Inpatient Hospital Stay: Payer: Medicare Other

## 2019-08-05 ENCOUNTER — Other Ambulatory Visit: Payer: Self-pay

## 2019-08-05 VITALS — BP 124/67 | HR 81 | Temp 97.8°F | Resp 18

## 2019-08-05 DIAGNOSIS — N183 Chronic kidney disease, stage 3 (moderate): Secondary | ICD-10-CM | POA: Diagnosis not present

## 2019-08-05 DIAGNOSIS — D508 Other iron deficiency anemias: Secondary | ICD-10-CM

## 2019-08-05 DIAGNOSIS — D500 Iron deficiency anemia secondary to blood loss (chronic): Secondary | ICD-10-CM

## 2019-08-05 LAB — CBC WITH DIFFERENTIAL/PLATELET
Abs Immature Granulocytes: 0.01 10*3/uL (ref 0.00–0.07)
Basophils Absolute: 0 10*3/uL (ref 0.0–0.1)
Basophils Relative: 0 %
Eosinophils Absolute: 0.2 10*3/uL (ref 0.0–0.5)
Eosinophils Relative: 3 %
HCT: 27.4 % — ABNORMAL LOW (ref 36.0–46.0)
Hemoglobin: 7.8 g/dL — ABNORMAL LOW (ref 12.0–15.0)
Immature Granulocytes: 0 %
Lymphocytes Relative: 18 %
Lymphs Abs: 1.2 10*3/uL (ref 0.7–4.0)
MCH: 25.1 pg — ABNORMAL LOW (ref 26.0–34.0)
MCHC: 28.5 g/dL — ABNORMAL LOW (ref 30.0–36.0)
MCV: 88.1 fL (ref 80.0–100.0)
Monocytes Absolute: 0.3 10*3/uL (ref 0.1–1.0)
Monocytes Relative: 5 %
Neutro Abs: 5 10*3/uL (ref 1.7–7.7)
Neutrophils Relative %: 74 %
Platelets: 240 10*3/uL (ref 150–400)
RBC: 3.11 MIL/uL — ABNORMAL LOW (ref 3.87–5.11)
RDW: 17.2 % — ABNORMAL HIGH (ref 11.5–15.5)
WBC: 6.7 10*3/uL (ref 4.0–10.5)
nRBC: 0 % (ref 0.0–0.2)

## 2019-08-05 LAB — SAMPLE TO BLOOD BANK

## 2019-08-05 MED ORDER — SODIUM CHLORIDE 0.9 % IV SOLN
Freq: Once | INTRAVENOUS | Status: AC
  Administered 2019-08-05: 14:00:00 via INTRAVENOUS
  Filled 2019-08-05: qty 250

## 2019-08-05 MED ORDER — SODIUM CHLORIDE 0.9 % IV SOLN
510.0000 mg | Freq: Once | INTRAVENOUS | Status: AC
  Administered 2019-08-05: 510 mg via INTRAVENOUS
  Filled 2019-08-05: qty 17

## 2019-08-08 LAB — COPPER, SERUM: Copper: 114 ug/dL (ref 72–166)

## 2019-08-17 ENCOUNTER — Ambulatory Visit: Payer: Medicare Other

## 2019-08-17 ENCOUNTER — Ambulatory Visit: Payer: Medicare Other | Admitting: Internal Medicine

## 2019-08-17 ENCOUNTER — Other Ambulatory Visit: Payer: Medicare Other

## 2019-08-18 ENCOUNTER — Other Ambulatory Visit: Payer: Self-pay

## 2019-08-19 ENCOUNTER — Other Ambulatory Visit: Payer: Self-pay

## 2019-08-19 ENCOUNTER — Inpatient Hospital Stay: Payer: Medicare Other

## 2019-08-19 ENCOUNTER — Encounter: Payer: Self-pay | Admitting: Internal Medicine

## 2019-08-19 ENCOUNTER — Inpatient Hospital Stay (HOSPITAL_BASED_OUTPATIENT_CLINIC_OR_DEPARTMENT_OTHER): Payer: Medicare Other | Admitting: Internal Medicine

## 2019-08-19 DIAGNOSIS — N183 Chronic kidney disease, stage 3 (moderate): Secondary | ICD-10-CM | POA: Diagnosis not present

## 2019-08-19 DIAGNOSIS — D500 Iron deficiency anemia secondary to blood loss (chronic): Secondary | ICD-10-CM

## 2019-08-19 DIAGNOSIS — D631 Anemia in chronic kidney disease: Secondary | ICD-10-CM | POA: Diagnosis not present

## 2019-08-19 DIAGNOSIS — D508 Other iron deficiency anemias: Secondary | ICD-10-CM

## 2019-08-19 LAB — CBC WITH DIFFERENTIAL/PLATELET
Abs Immature Granulocytes: 0.01 10*3/uL (ref 0.00–0.07)
Basophils Absolute: 0.1 10*3/uL (ref 0.0–0.1)
Basophils Relative: 1 %
Eosinophils Absolute: 0.2 10*3/uL (ref 0.0–0.5)
Eosinophils Relative: 3 %
HCT: 28.5 % — ABNORMAL LOW (ref 36.0–46.0)
Hemoglobin: 8.5 g/dL — ABNORMAL LOW (ref 12.0–15.0)
Immature Granulocytes: 0 %
Lymphocytes Relative: 22 %
Lymphs Abs: 1.3 10*3/uL (ref 0.7–4.0)
MCH: 28.1 pg (ref 26.0–34.0)
MCHC: 29.8 g/dL — ABNORMAL LOW (ref 30.0–36.0)
MCV: 94.4 fL (ref 80.0–100.0)
Monocytes Absolute: 0.3 10*3/uL (ref 0.1–1.0)
Monocytes Relative: 5 %
Neutro Abs: 4.3 10*3/uL (ref 1.7–7.7)
Neutrophils Relative %: 69 %
Platelets: 170 10*3/uL (ref 150–400)
RBC: 3.02 MIL/uL — ABNORMAL LOW (ref 3.87–5.11)
RDW: 23.5 % — ABNORMAL HIGH (ref 11.5–15.5)
WBC: 6.1 10*3/uL (ref 4.0–10.5)
nRBC: 0 % (ref 0.0–0.2)

## 2019-08-19 MED ORDER — SODIUM CHLORIDE 0.9 % IV SOLN
510.0000 mg | Freq: Once | INTRAVENOUS | Status: AC
  Administered 2019-08-19: 510 mg via INTRAVENOUS
  Filled 2019-08-19: qty 17

## 2019-08-19 MED ORDER — SODIUM CHLORIDE 0.9 % IV SOLN
Freq: Once | INTRAVENOUS | Status: AC
  Administered 2019-08-19: 15:00:00 via INTRAVENOUS
  Filled 2019-08-19: qty 250

## 2019-08-19 NOTE — Progress Notes (Signed)
[facility] OFFICE PROGRESS NOTE  Patient Care Team: Einar Pheasant, MD as PCP - General (Internal Medicine) Einar Pheasant, MD (Internal Medicine) Leia Alf, MD (Inactive) as Referring Physician (Internal Medicine) Isaias Cowman, MD (Internal Medicine) Albesa Seen, MD (Unknown Physician Specialty) Philis Kendall, MD (Unknown Physician Specialty) Brendolyn Patty, MD (Specialist) Schnier, Dolores Lory, MD (Vascular Surgery) Leia Alf, MD (Inactive) as Referring Physician (Internal Medicine) Isaias Cowman, MD (Internal Medicine) Albesa Seen, MD (Unknown Physician Specialty) Philis Kendall, MD (Unknown Physician Specialty) Brendolyn Patty, MD (Specialist) Schnier, Dolores Lory, MD (Vascular Surgery)   SUMMARY OF HEMATOLOGIC HISTORY:  # IRON DEFICIENCY ANEMIA- recurrent [? GI blood loss; Dr.Elliot; AVM-capsule]; IV Ferrahem q 72m last colo- 2012/march; EGD-- [id number]  July 2020 bone marrow biopsy-mild dysplastic changes; 40% hypercellularity; cytogenetics-normal.  #Colonoscopy-April 2020 aborted because of cardiac pause.  # CKD stage III-IV; CAD-Dr. PSaralyn Pilar INTERVAL HISTORY:   85-year-old female patient with above history of recurrent iron deficiency anemia unclear etiology -related to CKD is here for follow-up.  Patient continues with fatigue.  No blood in stools or black or stools.  Chronic shortness of breath on exertion.  No falls.   Review of Systems  Constitutional: Positive for malaise/fatigue. Negative for chills, diaphoresis, fever and weight loss.  HENT: Negative for nosebleeds and sore throat.   Eyes: Negative for double vision.  Respiratory: Positive for shortness of breath. Negative for cough, hemoptysis, sputum production and wheezing.   Cardiovascular: Negative for palpitations, orthopnea and leg swelling.  Gastrointestinal: Negative for abdominal pain, blood in stool, constipation, diarrhea, heartburn, melena, nausea and  vomiting.  Genitourinary: Negative for dysuria, frequency and urgency.  Musculoskeletal: Negative for back pain and joint pain.  Skin: Negative.  Negative for itching and rash.  Neurological: Negative for dizziness, tingling, focal weakness, weakness and headaches.  Endo/Heme/Allergies: Does not bruise/bleed easily.  Psychiatric/Behavioral: Negative for depression. The patient is not nervous/anxious and does not have insomnia.      PAST MEDICAL HISTORY :  Past Medical History:  Diagnosis Date  . Arthritis   . AV malformation of gastrointestinal tract   . Blood in stool   . CAD (coronary artery disease)   . [place]otid arterial disease ([place]   . Cataracts, bilateral   . Chronic blood loss anemia   . Chronic cystitis   . Complication of anesthesia    reaction to propofol  . Depression   . Diabetes mellitus ([place]   . GERD (gastroesophageal reflux disease)   . Hyperlipidemia   . Hypertension   . IDA (iron deficiency anemia)   . Stress incontinence     PAST SURGICAL HISTORY :   Past Surgical History:  Procedure Laterality Date  . ABDOMINAL HYSTERECTOMY  1972   ovaries left in place  . APPENDECTOMY  1992  . BACK SURGERY  06/08/2010   L3, L4, L5  . CAROTID ARTERY ANGIOPLASTY  [place]03  [place]   right then left   . CHOLECYSTECTOMY  1993  . COLONOSCOPY WITH PROPOFOL N/A 02/02/2019   Procedure: COLONOSCOPY WITH PROPOFOL;  Surgeon: EManya Silvas MD;  [facility];  Service: Endoscopy;  Laterality: N/A;  . ESOPHAGOGASTRODUODENOSCOPY N/A 02/02/2019   Procedure: ESOPHAGOGASTRODUODENOSCOPY (EGD);  Surgeon: EManya Silvas MD;  [facility];  Service: Endoscopy;  Laterality: N/A;  . FOOT SURGERY  06/2007   Left  . right carotid artery surgery  01/09/13   Dr. SDelana Meyer@ [facility]  . TOTAL HIP ARTHROPLASTY  05/03/2011   left 12, right 11/07    FAMILY HISTORY :   Family History  Problem Relation Age of Onset  . Hyperlipidemia Father    . Heart disease Father        myocardial infarction  . Hypertension Father        Parent  . Arthritis Other        parent  . Diabetes Other        nephew  . Cervical cancer Sister   . Rectal cancer Sister   . Breast cancer Neg Hx     SOCIAL HISTORY:   Social History   Tobacco Use  . Smoking status: Former Smoker    Types: Cigarettes    Quit date: 11/19/1989    Years since quitting: 29.7  . Smokeless tobacco: Never Used  Substance Use Topics  . Alcohol use: No    Alcohol/week: 0.0 standard drinks  . Drug use: No    ALLERGIES:  is allergic to ciprofloxacin; levaquin [levofloxacin]; propofol; and tequin [gatifloxacin].  MEDICATIONS:  Current Outpatient Medications  Medication Sig Dispense Refill  . aspirin 81 MG tablet Take 81 mg by mouth daily.    . Biotin 5000 MCG CAPS Take 1 capsule by mouth daily.    . cetirizine (ZYRTEC) 10 MG tablet Take 10 mg by mouth daily.    . isosorbide mononitrate (IMDUR) 30 MG 24 hr tablet Take 30 mg by mouth daily.     . Omega-3 Fatty Acids (FISH OIL) 1200 MG CAPS Take 1 capsule by mouth daily.    Marland Kitchen omeprazole (PRILOSEC) 40 MG capsule TAKE 1 TABLET 30 MINS BEFORE BREAKFAST AND 1 TABLET 30 MINS BEFORE DINNER.    Marland Kitchen rosuvastatin (CRESTOR) 40 MG tablet TAKE 1 TABLET DAILY 90 tablet 3  . sucralfate (CARAFATE) 1 g tablet Take 1 tablet by mouth 3 (three) times daily.    . traZODone (DESYREL) 50 MG tablet TAKE 0.5-1 TABLETS (25-50 MG TOTAL) BY MOUTH AT BEDTIME AS NEEDED FOR SLEEP. 90 tablet 1   No current facility-administered medications for this visit.     PHYSICAL EXAMINATION:   BP (!) 146/66 (BP Location: Left Arm, Patient Position: Sitting, Cuff Size: Normal)   Pulse 76   Temp (!) 96.7 F (35.9 C) (Tympanic)   Wt 135 lb (61.2 kg)   BMI 27.27 kg/m   Filed Weights   08/18/19 1400  Weight: 135 lb (61.2 kg)    Physical Exam  Constitutional: She is oriented to person, place, and time and well-developed, well-nourished, and in no  distress.  Frail-appearing Caucasian female patient.  She is in a wheelchair.  HENT:  Head: Normocephalic and atraumatic.  Mouth/Throat: Oropharynx is clear and moist. No oropharyngeal exudate.  Eyes: Pupils are equal, round, and reactive to light.  Positive for pallor.  Neck: Normal range of motion. Neck supple.  Cardiovascular: Normal rate and regular rhythm.  Pulmonary/Chest: No respiratory distress. She has no wheezes.  Decreased air entry at the bases.  Abdominal: Soft. Bowel sounds are normal. She exhibits no distension and no mass. There is no abdominal tenderness. There is no rebound and no guarding.  Musculoskeletal: Normal range of motion.        General: No tenderness or edema.  Neurological: She is alert and oriented to person, place, and time.  Skin: Skin is warm.  Psychiatric: Affect normal.    LABORATORY DATA:  I have reviewed the data as listed    Component Value Date/Time   NA 140  07/28/2019 1056   NA 143 01/10/2013 0314   K 4.2 07/28/2019 1056   K 4.0 01/10/2013 0314   CL 108 07/28/2019 1056   CL 109 (H) 01/10/2013 0314   CO2 24 07/28/2019 1056   CO2 27 01/10/2013 0314   GLUCOSE 129 (H) 07/28/2019 1056   GLUCOSE 119 (H) 01/10/2013 0314   BUN 18 07/28/2019 1056   BUN 15 01/10/2013 0314   CREATININE 1.31 (H) 07/28/2019 1056   CREATININE 1.03 01/10/2013 0314   CREATININE 1.13 (H) 09/18/2012 0848   CALCIUM 8.9 07/28/2019 1056   CALCIUM 8.1 (L) 01/10/2013 0314   PROT 6.8 07/28/2019 1056   PROT 7.2 12/20/2011 1521   ALBUMIN 3.9 07/28/2019 1056   ALBUMIN 3.9 12/20/2011 1521   AST 14 07/28/2019 1056   AST 31 12/20/2011 1521   ALT 11 07/28/2019 1056   ALT 37 12/20/2011 1521   ALKPHOS 78 07/28/2019 1056   ALKPHOS 57 12/20/2011 1521   BILITOT 0.5 07/28/2019 1056   BILITOT 0.5 12/20/2011 1521   GFRNONAA 32 (L) 06/22/2019 1410   GFRNONAA 52 (L) 01/10/2013 0314   GFRNONAA 47 (L) 09/18/2012 0848   GFRAA 37 (L) 06/22/2019 1410   GFRAA >60 01/10/2013 0314    GFRAA 54 (L) 09/18/2012 0848    No results found for: SPEP, UPEP  Lab Results  Component Value Date   WBC 6.1 08/19/2019   NEUTROABS 4.3 08/19/2019   HGB 8.5 (L) 08/19/2019   HCT 28.5 (L) 08/19/2019   MCV 94.4 08/19/2019   PLT 170 08/19/2019      Chemistry      Component Value Date/Time   NA 140 07/28/2019 1056   NA 143 01/10/2013 0314   K 4.2 07/28/2019 1056   K 4.0 01/10/2013 0314   CL 108 07/28/2019 1056   CL 109 (H) 01/10/2013 0314   CO2 24 07/28/2019 1056   CO2 27 01/10/2013 0314   BUN 18 07/28/2019 1056   BUN 15 01/10/2013 0314   CREATININE 1.31 (H) 07/28/2019 1056   CREATININE 1.03 01/10/2013 0314   CREATININE 1.13 (H) 09/18/2012 0848      Component Value Date/Time   CALCIUM 8.9 07/28/2019 1056   CALCIUM 8.1 (L) 01/10/2013 0314   ALKPHOS 78 07/28/2019 1056   ALKPHOS 57 12/20/2011 1521   AST 14 07/28/2019 1056   AST 31 12/20/2011 1521   ALT 11 07/28/2019 1056   ALT 37 12/20/2011 1521   BILITOT 0.5 07/28/2019 1056   BILITOT 0.5 12/20/2011 1521         ASSESSMENT & PLAN:   Anemia of chronic kidney failure, stage 3 (moderate) (HCC) #Severe anemia hemoglobin-nadir 6.  Unclear etiology question CKD versus MDS.  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  Cytogenetics normal.; F-ONE PENDING.   #Hemoglobin today is 8.3.  Proceed with IV Feraheme [patient preference].  We will plan Retacrit at next visit.  # CKD stage III-1.49 stable  I spoke at length with the patient's family, Shirlean Mylar- regarding the patient's clinical status/plan of care.  Family agreement.   # DISPOSITION:  # Ferrahem today # in 2 weeks/ Retacrit # in  4 weeks- MD- labs- cbc/hold tube; Retacrit- Dr.B     Cammie Sickle, MD 08/19/2019 4:30 PM

## 2019-08-19 NOTE — Assessment & Plan Note (Addendum)
#  Severe anemia hemoglobin-nadir 6.  Unclear etiology question CKD versus MDS.  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  Cytogenetics normal.; F-ONE PENDING.   #Hemoglobin today is 8.3.  Proceed with IV Feraheme [patient preference].  We will plan Retacrit at next visit.  # CKD stage III-1.49 stable  I spoke at length with the patient's family, Shirlean Mylar- regarding the patient's clinical status/plan of care.  Family agreement.   # DISPOSITION:  # Ferrahem today # in 2 weeks/ Retacrit # in  4 weeks- MD- labs- cbc/hold tube; Retacrit- Dr.B

## 2019-08-20 ENCOUNTER — Ambulatory Visit (INDEPENDENT_AMBULATORY_CARE_PROVIDER_SITE_OTHER): Payer: Medicare Other | Admitting: Vascular Surgery

## 2019-08-20 ENCOUNTER — Ambulatory Visit (INDEPENDENT_AMBULATORY_CARE_PROVIDER_SITE_OTHER): Payer: Medicare Other

## 2019-08-20 ENCOUNTER — Encounter (INDEPENDENT_AMBULATORY_CARE_PROVIDER_SITE_OTHER): Payer: Self-pay | Admitting: Vascular Surgery

## 2019-08-20 VITALS — BP 153/72 | HR 90 | Resp 18 | Ht 59.0 in | Wt 135.0 lb

## 2019-08-20 DIAGNOSIS — I2510 Atherosclerotic heart disease of native coronary artery without angina pectoris: Secondary | ICD-10-CM

## 2019-08-20 DIAGNOSIS — I779 Disorder of arteries and arterioles, unspecified: Secondary | ICD-10-CM

## 2019-08-20 DIAGNOSIS — I4891 Unspecified atrial fibrillation: Secondary | ICD-10-CM | POA: Diagnosis not present

## 2019-08-20 DIAGNOSIS — E7800 Pure hypercholesterolemia, unspecified: Secondary | ICD-10-CM

## 2019-08-20 DIAGNOSIS — I10 Essential (primary) hypertension: Secondary | ICD-10-CM

## 2019-08-20 NOTE — Progress Notes (Signed)
MRN : [id number]  Jocelyn Elliott is a 85 y.o. ([birth year]) female who presents with chief complaint of  Chief Complaint  Patient presents with  . Follow-up    ultrasound  .  History of Present Illness:   The patient is seen for follow up evaluation of carotid stenosis. The carotid stenosis followed by ultrasound.   The patient denies amaurosis fugax. There is no recent history of TIA symptoms or focal motor deficits. There is no prior documented CVA.  The patient is taking enteric-coated aspirin 81 mg daily.  There is no history of migraine headaches. There is no history of seizures.  The patient has a history of coronary artery disease, no recent episodes of angina or shortness of breath. The patient denies PAD or claudication symptoms. There is a history of hyperlipidemia which is being treated with a statin.   Carotid Duplex done today shows patent right CEA and [id number] LICA stenosis.  No change compared to last study in 07/28/2018  Current Meds  Medication Sig  . aspirin 81 MG tablet Take 81 mg by mouth daily.  . Biotin 5000 MCG CAPS Take 1 capsule by mouth daily.  . cetirizine (ZYRTEC) 10 MG tablet Take 10 mg by mouth daily.  . isosorbide mononitrate (IMDUR) 30 MG 24 hr tablet Take 30 mg by mouth daily.   . Omega-3 Fatty Acids (FISH OIL) 1200 MG CAPS Take 1 capsule by mouth daily.  Marland Kitchen omeprazole (PRILOSEC) 40 MG capsule TAKE 1 TABLET 30 MINS BEFORE BREAKFAST AND 1 TABLET 30 MINS BEFORE DINNER.  Marland Kitchen rosuvastatin (CRESTOR) 40 MG tablet TAKE 1 TABLET DAILY  . sucralfate (CARAFATE) 1 g tablet Take 1 tablet by mouth 3 (three) times daily.  . traZODone (DESYREL) 50 MG tablet TAKE 0.5-1 TABLETS (25-50 MG TOTAL) BY MOUTH AT BEDTIME AS NEEDED FOR SLEEP.    Past Medical History:  Diagnosis Date  . Arthritis   . AV malformation of gastrointestinal tract   . Blood in stool   . CAD (coronary artery disease)   . Carotid arterial disease ([place])   . Cataracts, bilateral   .  Chronic blood loss anemia   . Chronic cystitis   . Complication of anesthesia    reaction to propofol  . Depression   . Diabetes mellitus ([place])   . GERD (gastroesophageal reflux disease)   . Hyperlipidemia   . Hypertension   . IDA (iron deficiency anemia)   . Stress incontinence     Past Surgical History:  Procedure Laterality Date  . ABDOMINAL HYSTERECTOMY  1972   ovaries left in place  . APPENDECTOMY  1992  . BACK SURGERY  06/08/2010   L3, L4, L5  . CAROTID ARTERY ANGIOPLASTY  11/03  . [place], 89   right then left   . CHOLECYSTECTOMY  1993  . COLONOSCOPY WITH PROPOFOL N/A 02/02/2019   Procedure: COLONOSCOPY WITH PROPOFOL;  Surgeon: Manya Silvas, MD;  [facility];  Service: Endoscopy;  Laterality: N/A;  . ESOPHAGOGASTRODUODENOSCOPY N/A 02/02/2019   Procedure: ESOPHAGOGASTRODUODENOSCOPY (EGD);  Surgeon: Manya Silvas, MD;  Location: [facility]COPY;  Service: Endoscopy;  Laterality: N/A;  . FOOT SURGERY  06/2007   Left  . right carotid artery surgery  01/09/13   Dr. Delana Meyer @ [facility]  . TOTAL HIP ARTHROPLASTY  05/03/2011   left 12, right 11/07    Social History Social History   Tobacco Use  . Smoking status: Former Smoker    Types:  Cigarettes    Quit date: 11/19/1989    Years since quitting: 29.7  . Smokeless tobacco: Never Used  Substance Use Topics  . Alcohol use: No    Alcohol/week: 0.0 standard drinks  . Drug use: No    Family History Family History  Problem Relation Age of Onset  . Hyperlipidemia Father   . Heart disease Father        myocardial infarction  . Hypertension Father        Parent  . Arthritis Other        parent  . Diabetes Other        nephew  . Cervical cancer Sister   . Rectal cancer Sister   . Breast cancer Neg Hx     Allergies  Allergen Reactions  . Ciprofloxacin   . Levaquin [Levofloxacin]   . Propofol Other (See Comments)  . Tequin [Gatifloxacin] Hives and Rash     REVIEW OF  SYSTEMS (Negative unless checked)  Constitutional: [] Weight loss  [] Fever  [] Chills Cardiac: [] Chest pain   [] Chest pressure   [] Palpitations   [] Shortness of breath when laying flat   [] Shortness of breath with exertion. Vascular:  [] Pain in legs with walking   [] Pain in legs at rest  [] History of DVT   [] Phlebitis   [] Swelling in legs   [] Varicose veins   [] Non-healing ulcers Pulmonary:   [] Uses home oxygen   [] Productive cough   [] Hemoptysis   [] Wheeze  [] COPD   [] Asthma Neurologic:  [] Dizziness   [] Seizures   [] History of stroke   [] History of TIA  [] Aphasia   [] Vissual changes   [] Weakness or numbness in arm   [] Weakness or numbness in leg Musculoskeletal:   [] Joint swelling   [] Joint pain   [] Low back pain Hematologic:  [] Easy bruising  [] Easy bleeding   [] Hypercoagulable state   [] Anemic Gastrointestinal:  [] Diarrhea   [] Vomiting  [] Gastroesophageal reflux/heartburn   [] Difficulty swallowing. Genitourinary:  [] Chronic kidney disease   [] Difficult urination  [] Frequent urination   [] Blood in urine Skin:  [] Rashes   [] Ulcers  Psychological:  [] History of anxiety   []  History of major depression.  Physical Examination  Vitals:   08/20/19 1357 08/20/19 1358  BP: (!) 157/67 (!) 153/72  Pulse: 90   Resp: 18   Weight: 135 lb (61.2 kg)   Height: 4\' 11"  (1.499 m)    Body mass index is 27.27 kg/m. Gen: WD/WN, NAD Head: [place]/AT, No temporalis wasting.  Ear/Nose/Throat: Hearing grossly intact, nares w/o erythema or drainage Eyes: PER, EOMI, sclera nonicteric.  Neck: Supple, no large masses.   Pulmonary:  Good air movement, no audible wheezing bilaterally, no use of accessory muscles.  Cardiac: RRR, no JVD Vascular: bilateral carotid bruits bilateral well healed incisional scars s/p CEA Vessel Right Left  Radial Palpable Palpable  Brachial Palpable Palpable  Carotid Palpable Palpable  Gastrointestinal: Non-distended. No guarding/no peritoneal signs.  Musculoskeletal: M/S 5/5  throughout.  No deformity or atrophy.  Neurologic: CN 2-12 intact. Symmetrical.  Speech is fluent. Motor exam as listed above. Psychiatric: Judgment intact, Mood & affect appropriate for pt's clinical situation. Dermatologic: No rashes or ulcers noted.  No changes consistent with cellulitis. Lymph : No lichenification or skin changes of chronic lymphedema.  CBC Lab Results  Component Value Date   WBC 6.1 08/19/2019   HGB 8.5 (L) 08/19/2019   HCT 28.5 (L) 08/19/2019   MCV 94.4 08/19/2019   PLT 170 08/19/2019    BMET  Component Value Date/Time   NA 140 07/28/2019 1056   NA 143 01/10/2013 0314   K 4.2 07/28/2019 1056   K 4.0 01/10/2013 0314   CL 108 07/28/2019 1056   CL 109 (H) 01/10/2013 0314   CO2 24 07/28/2019 1056   CO2 27 01/10/2013 0314   GLUCOSE 129 (H) 07/28/2019 1056   GLUCOSE 119 (H) 01/10/2013 0314   BUN 18 07/28/2019 1056   BUN 15 01/10/2013 0314   CREATININE 1.31 (H) 07/28/2019 1056   CREATININE 1.03 01/10/2013 0314   CREATININE 1.13 (H) 09/18/2012 0848   CALCIUM 8.9 07/28/2019 1056   CALCIUM 8.1 (L) 01/10/2013 0314   GFRNONAA 32 (L) 06/22/2019 1410   GFRNONAA 52 (L) 01/10/2013 0314   GFRNONAA 47 (L) 09/18/2012 0848   GFRAA 37 (L) 06/22/2019 1410   GFRAA >60 01/10/2013 0314   GFRAA 54 (L) 09/18/2012 0848   CrCl cannot be calculated (Patient's most recent lab result is older than the maximum 21 days allowed.).  COAG Lab Results  Component Value Date   INR 1.0 06/15/2019   INR 1.0 01/10/2013    Radiology No results found.   Assessment/Plan 1. Carotid artery disease, unspecified laterality, unspecified type ([place]) Recommend:  Given the patient's asymptomatic subcritical stenosis no further invasive testing or surgery at this time.  Duplex ultrasound shows <50% stenosis bilaterally.  Continue antiplatelet therapy as prescribed Continue management of CAD, HTN and Hyperlipidemia Healthy heart diet,  encouraged exercise at least 4 times per  week  Follow up in 12 months with duplex ultrasound and physical exam  - VAS US CAROTID; Future  2. Coronary artery disease involving native coronary artery of native heart without angina pectoris Continue cardiac and antihypertensive medications as already ordered and reviewed, no changes at this time.  Continue statin as ordered and reviewed, no changes at this time  Nitrates PRN for chest pain  3. Essential hypertension Continue antihypertensive medications as already ordered, these medications have been reviewed and there are no changes at this time.   4. Atrial fibrillation, unspecified type ([place]) Continue antiarrhythmia medications as already ordered, these medications have been reviewed and there are no changes at this time.  Continue anticoagulation as ordered by Cardiology Service   5. Hypercholesteremia Continue statin as ordered and reviewed, no changes at this time     Hortencia Pilar, MD  08/20/2019 2:48 PM

## 2019-08-27 ENCOUNTER — Telehealth: Payer: Self-pay | Admitting: Internal Medicine

## 2019-08-27 NOTE — Telephone Encounter (Signed)
She is going to call and see which meter insurance prefers

## 2019-08-27 NOTE — Telephone Encounter (Addendum)
Pt states she has been trying to order her test strips since 08/20/19. Her test strips need to come from mail order, and she just found out today.  Pt needs new meter, test strips, and lancets. Her one touch meter she has now will not work. (she has had 20 yrs)  Whatever her insurance will pay for.  Pt test one time a day. This is a new Rx for her through [facility]. Pt has not been able to test at all this month.  [place], [place] - [address] [phone] (Phone) [phone] (Fax)

## 2019-08-28 NOTE — Telephone Encounter (Signed)
Patient is calling Larena Glassman back to give her the information she requested - Gluco meter is Accu-Chek [employer] Part B.  If there are any questions, please call patient at [phone]

## 2019-08-31 NOTE — Telephone Encounter (Signed)
rx written for pt

## 2019-08-31 NOTE — Telephone Encounter (Signed)
Pt aware.

## 2019-09-01 ENCOUNTER — Other Ambulatory Visit: Payer: Self-pay

## 2019-09-02 ENCOUNTER — Inpatient Hospital Stay: Payer: Medicare Other | Attending: Internal Medicine

## 2019-09-02 ENCOUNTER — Other Ambulatory Visit: Payer: Self-pay

## 2019-09-02 VITALS — BP 102/62 | HR 79

## 2019-09-02 DIAGNOSIS — D631 Anemia in chronic kidney disease: Secondary | ICD-10-CM | POA: Diagnosis present

## 2019-09-02 DIAGNOSIS — D649 Anemia, unspecified: Secondary | ICD-10-CM

## 2019-09-02 DIAGNOSIS — N184 Chronic kidney disease, stage 4 (severe): Secondary | ICD-10-CM | POA: Diagnosis present

## 2019-09-02 MED ORDER — EPOETIN ALFA-EPBX 40000 UNIT/ML IJ SOLN
40000.0000 [IU] | Freq: Once | INTRAMUSCULAR | Status: AC
  Administered 2019-09-02: 40000 [IU] via SUBCUTANEOUS

## 2019-09-02 NOTE — Progress Notes (Signed)
Per dr. Rogue Bussing use labs from 9/30 for injection today

## 2019-09-15 ENCOUNTER — Other Ambulatory Visit: Payer: Self-pay

## 2019-09-15 NOTE — Progress Notes (Signed)
Called patient no answer left message  

## 2019-09-16 ENCOUNTER — Encounter: Payer: Self-pay | Admitting: Oncology

## 2019-09-16 ENCOUNTER — Inpatient Hospital Stay: Payer: Medicare Other

## 2019-09-16 ENCOUNTER — Ambulatory Visit
Admission: RE | Admit: 2019-09-16 | Discharge: 2019-09-16 | Disposition: A | Discharge status: Home / Self Care | Payer: Medicare Other | Source: Ambulatory Visit | Attending: Internal Medicine | Admitting: Internal Medicine

## 2019-09-16 ENCOUNTER — Other Ambulatory Visit: Payer: Self-pay

## 2019-09-16 ENCOUNTER — Inpatient Hospital Stay (HOSPITAL_BASED_OUTPATIENT_CLINIC_OR_DEPARTMENT_OTHER): Payer: Medicare Other | Admitting: Oncology

## 2019-09-16 VITALS — BP 154/72 | HR 80 | Temp 97.0°F | Resp 18 | Wt 136.2 lb

## 2019-09-16 DIAGNOSIS — D631 Anemia in chronic kidney disease: Secondary | ICD-10-CM

## 2019-09-16 DIAGNOSIS — D500 Iron deficiency anemia secondary to blood loss (chronic): Secondary | ICD-10-CM

## 2019-09-16 DIAGNOSIS — Z1239 Encounter for other screening for malignant neoplasm of breast: Secondary | ICD-10-CM

## 2019-09-16 DIAGNOSIS — N183 Chronic kidney disease, stage 3 unspecified: Secondary | ICD-10-CM

## 2019-09-16 DIAGNOSIS — Z1231 Encounter for screening mammogram for malignant neoplasm of breast: Secondary | ICD-10-CM | POA: Diagnosis present

## 2019-09-16 DIAGNOSIS — D649 Anemia, unspecified: Secondary | ICD-10-CM | POA: Diagnosis not present

## 2019-09-16 DIAGNOSIS — N184 Chronic kidney disease, stage 4 (severe): Secondary | ICD-10-CM | POA: Diagnosis not present

## 2019-09-16 LAB — CBC WITH DIFFERENTIAL/PLATELET
Abs Immature Granulocytes: 0.02 10*3/uL (ref 0.00–0.07)
Basophils Absolute: 0.1 10*3/uL (ref 0.0–0.1)
Basophils Relative: 1 %
Eosinophils Absolute: 0.2 10*3/uL (ref 0.0–0.5)
Eosinophils Relative: 4 %
HCT: 34.9 % — ABNORMAL LOW (ref 36.0–46.0)
Hemoglobin: 11.2 g/dL — ABNORMAL LOW (ref 12.0–15.0)
Immature Granulocytes: 0 %
Lymphocytes Relative: 23 %
Lymphs Abs: 1.3 10*3/uL (ref 0.7–4.0)
MCH: 30.9 pg (ref 26.0–34.0)
MCHC: 32.1 g/dL (ref 30.0–36.0)
MCV: 96.1 fL (ref 80.0–100.0)
Monocytes Absolute: 0.3 10*3/uL (ref 0.1–1.0)
Monocytes Relative: 6 %
Neutro Abs: 3.6 10*3/uL (ref 1.7–7.7)
Neutrophils Relative %: 66 %
Platelets: 160 10*3/uL (ref 150–400)
RBC: 3.63 MIL/uL — ABNORMAL LOW (ref 3.87–5.11)
RDW: 17.8 % — ABNORMAL HIGH (ref 11.5–15.5)
WBC: 5.5 10*3/uL (ref 4.0–10.5)
nRBC: 0 % (ref 0.0–0.2)

## 2019-09-16 LAB — SAMPLE TO BLOOD BANK

## 2019-09-16 NOTE — Progress Notes (Signed)
Patient does not offer any problems today.  

## 2019-09-19 NOTE — Progress Notes (Signed)
[place] OFFICE PROGRESS NOTE  Patient Care Team: Einar Pheasant, MD as PCP - General (Internal Medicine) Einar Pheasant, MD (Internal Medicine) Leia Alf, MD (Inactive) as Referring Physician (Internal Medicine) Isaias Cowman, MD (Internal Medicine) Albesa Seen, MD (Unknown Physician Specialty) Philis Kendall, MD (Unknown Physician Specialty) Brendolyn Patty, MD (Specialist) Schnier, Dolores Lory, MD (Vascular Surgery) Leia Alf, MD (Inactive) as Referring Physician (Internal Medicine) Isaias Cowman, MD (Internal Medicine) Albesa Seen, MD (Unknown Physician Specialty) Philis Kendall, MD (Unknown Physician Specialty) Brendolyn Patty, MD (Specialist) Schnier, Dolores Lory, MD (Vascular Surgery)   SUMMARY OF HEMATOLOGIC HISTORY:  # IRON DEFICIENCY ANEMIA- recurrent [? GI blood loss; Dr.Elliot; AVM-capsule]; IV Ferrahem q 20m last colo- 2012/march; EGD-- [id number]  July 2020 bone marrow biopsy-mild dysplastic changes; 40% hypercellularity; cytogenetics-normal.  #Colonoscopy-April 2020 aborted because of cardiac pause.  # CKD stage III-IV; CAD-Dr. 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  Cytogenetics normal.; F-ONE PENDING  INTERVAL HISTORY:  85-year-old female patient with above history of recurrent iron deficiency anemia unclear etiology -related to CKD is here for follow-up. Patient follows up with Dr.Brahmanday who is off today, I am covering him to see this patient. Patient reports feeling Pretty well today. Patient has received IV iron treatments. No new complaints today.  Fatigue has improved.  No blood in stool or black stools.  Chronic shortness of breath with exertion slightly improved.    Review of Systems  Constitutional: Negative for chills, diaphoresis, fever, malaise/fatigue and weight loss.  HENT: Negative for nosebleeds and sore throat.   Eyes: Negative for  double vision and redness.  Respiratory: Positive for shortness of breath. Negative for cough, hemoptysis, sputum production and wheezing.   Cardiovascular: Negative for chest pain, palpitations, orthopnea and leg swelling.  Gastrointestinal: Negative for abdominal pain, blood in stool, constipation, diarrhea, heartburn, melena, nausea and vomiting.  Genitourinary: Negative for dysuria, frequency and urgency.  Musculoskeletal: Negative for back pain, joint pain and myalgias.  Skin: Negative for itching and rash.  Neurological: Negative for dizziness, tingling, tremors, focal weakness, weakness and headaches.  Endo/Heme/Allergies: Does not bruise/bleed easily.  Psychiatric/Behavioral: Negative for depression and hallucinations. The patient is not nervous/anxious and does not have insomnia.      PAST MEDICAL HISTORY :  Past Medical History:  Diagnosis Date  . Arthritis   . AV malformation of gastrointestinal tract   . Blood in stool   . CAD (coronary artery disease)   . Carotid arterial disease ([place]   . Cataracts, bilateral   . Chronic blood loss anemia   . Chronic cystitis   . Complication of anesthesia    reaction to propofol  . Depression   . Diabetes mellitus ([place]   . GERD (gastroesophageal reflux disease)   . Hyperlipidemia   . Hypertension   . IDA (iron deficiency anemia)   . Stress incontinence     PAST SURGICAL HISTORY :   Past Surgical History:  Procedure Laterality Date  . ABDOMINAL HYSTERECTOMY  1972   ovaries left in place  . APPENDECTOMY  1992  . BACK SURGERY  06/08/2010   L3, L4, L5  . CAROTID ARTERY ANGIOPLASTY  11/03  . [place] [facility]   right then left   . CHOLECYSTECTOMY  1993  . COLONOSCOPY WITH PROPOFOL N/A 02/02/2019   Procedure: COLONOSCOPY WITH PROPOFOL;  Surgeon: EManya Silvas MD;  [facility];  Service: Endoscopy;  Laterality: N/A;  . ESOPHAGOGASTRODUODENOSCOPY N/A 02/02/2019  Procedure:  ESOPHAGOGASTRODUODENOSCOPY (EGD);  Surgeon: Manya Silvas, MD;  [facility];  Service: Endoscopy;  Laterality: N/A;  . FOOT SURGERY  06/2007   Left  . right carotid artery surgery  01/09/13   Dr. Delana Meyer @ [facility]  . TOTAL HIP ARTHROPLASTY  05/03/2011   left 12, right 11/07    FAMILY HISTORY :   Family History  Problem Relation Age of Onset  . Hyperlipidemia Father   . Heart disease Father        myocardial infarction  . Hypertension Father        Parent  . Arthritis Other        parent  . Diabetes Other        nephew  . Cervical cancer Sister   . Rectal cancer Sister   . Breast cancer Neg Hx     SOCIAL HISTORY:   Social History   Tobacco Use  . Smoking status: Former Smoker    Types: Cigarettes    Quit date: 11/19/1989    Years since quitting: 29.8  . Smokeless tobacco: Never Used  Substance Use Topics  . Alcohol use: No    Alcohol/week: 0.0 standard drinks  . Drug use: No    ALLERGIES:  is allergic to ciprofloxacin; levaquin [levofloxacin]; propofol; and tequin [gatifloxacin].  MEDICATIONS:  Current Outpatient Medications  Medication Sig Dispense Refill  . aspirin 81 MG tablet Take 81 mg by mouth daily.    . Biotin 5000 MCG CAPS Take 1 capsule by mouth daily.    . cetirizine (ZYRTEC) 10 MG tablet Take 10 mg by mouth daily.    . isosorbide mononitrate (IMDUR) 30 MG 24 hr tablet Take 30 mg by mouth daily.     . Omega-3 Fatty Acids (FISH OIL) 1200 MG CAPS Take 1 capsule by mouth daily.    . rosuvastatin (CRESTOR) 40 MG tablet TAKE 1 TABLET DAILY 90 tablet 3  . sucralfate (CARAFATE) 1 g tablet Take 1 g by mouth 3 (three) times daily. TID    . traZODone (DESYREL) 50 MG tablet TAKE 0.5-1 TABLETS (25-50 MG TOTAL) BY MOUTH AT BEDTIME AS NEEDED FOR SLEEP. 90 tablet 1  . omeprazole (PRILOSEC) 40 MG capsule TAKE 1 TABLET 30 MINS BEFORE BREAKFAST AND 1 TABLET 30 MINS BEFORE DINNER.     No current facility-administered medications for this visit.      PHYSICAL EXAMINATION:   BP (!) 154/72   Pulse 80   Temp (!) 97 F (36.1 C)   Resp 18   Wt 136 lb 3.2 oz (61.8 kg)   BMI 27.51 kg/m   Filed Weights   09/16/19 1339  Weight: 136 lb 3.2 oz (61.8 kg)    Physical Exam  Constitutional: She is oriented to person, place, and time and well-developed, well-nourished, and in no distress. No distress.  Frail-appearing Caucasian female patient.  She is in a wheelchair.  HENT:  Head: Normocephalic and atraumatic.  Mouth/Throat: No oropharyngeal exudate.  Eyes: Pupils are equal, round, and reactive to light. EOM are normal. No scleral icterus.  Neck: Normal range of motion. Neck supple.  Cardiovascular: Normal rate and regular rhythm.  No murmur heard. Pulmonary/Chest: Effort normal. No respiratory distress. She has no wheezes. She has no rales. She exhibits no tenderness.  Decreased air entry at the bases.  Abdominal: Soft. Bowel sounds are normal. She exhibits no distension and no mass. There is no abdominal tenderness. There is no rebound and no guarding.  Musculoskeletal: Normal range  of motion.        General: No tenderness or edema.  Neurological: She is alert and oriented to person, place, and time. No cranial nerve deficit. She exhibits normal muscle tone. Coordination normal.  Skin: Skin is warm and dry. She is not diaphoretic. No erythema.  Psychiatric: Affect normal.    LABORATORY DATA:  I have reviewed the data as listed    Component Value Date/Time   NA 140 07/28/2019 1056   NA 143 01/10/2013 0314   K 4.2 07/28/2019 1056   K 4.0 01/10/2013 0314   CL 108 07/28/2019 1056   CL 109 (H) 01/10/2013 0314   CO2 24 07/28/2019 1056   CO2 27 01/10/2013 0314   GLUCOSE 129 (H) 07/28/2019 1056   GLUCOSE 119 (H) 01/10/2013 0314   BUN 18 07/28/2019 1056   BUN 15 01/10/2013 0314   CREATININE 1.31 (H) 07/28/2019 1056   CREATININE 1.03 01/10/2013 0314   CREATININE 1.13 (H) 09/18/2012 0848   CALCIUM 8.9 07/28/2019 1056    CALCIUM 8.1 (L) 01/10/2013 0314   PROT 6.8 07/28/2019 1056   PROT 7.2 12/20/2011 1521   ALBUMIN 3.9 07/28/2019 1056   ALBUMIN 3.9 12/20/2011 1521   AST 14 07/28/2019 1056   AST 31 12/20/2011 1521   ALT 11 07/28/2019 1056   ALT 37 12/20/2011 1521   ALKPHOS 78 07/28/2019 1056   ALKPHOS 57 12/20/2011 1521   BILITOT 0.5 07/28/2019 1056   BILITOT 0.5 12/20/2011 1521   GFRNONAA 32 (L) 06/22/2019 1410   GFRNONAA 52 (L) 01/10/2013 0314   GFRNONAA 47 (L) 09/18/2012 0848   GFRAA 37 (L) 06/22/2019 1410   GFRAA >60 01/10/2013 0314   GFRAA 54 (L) 09/18/2012 0848    No results found for: SPEP, UPEP  Lab Results  Component Value Date   WBC 5.5 09/16/2019   NEUTROABS 3.6 09/16/2019   HGB 11.2 (L) 09/16/2019   HCT 34.9 (L) 09/16/2019   MCV 96.1 09/16/2019   PLT 160 09/16/2019      Chemistry      Component Value Date/Time   NA 140 07/28/2019 1056   NA 143 01/10/2013 0314   K 4.2 07/28/2019 1056   K 4.0 01/10/2013 0314   CL 108 07/28/2019 1056   CL 109 (H) 01/10/2013 0314   CO2 24 07/28/2019 1056   CO2 27 01/10/2013 0314   BUN 18 07/28/2019 1056   BUN 15 01/10/2013 0314   CREATININE 1.31 (H) 07/28/2019 1056   CREATININE 1.03 01/10/2013 0314   CREATININE 1.13 (H) 09/18/2012 0848      Component Value Date/Time   CALCIUM 8.9 07/28/2019 1056   CALCIUM 8.1 (L) 01/10/2013 0314   ALKPHOS 78 07/28/2019 1056   ALKPHOS 57 12/20/2011 1521   AST 14 07/28/2019 1056   AST 31 12/20/2011 1521   ALT 11 07/28/2019 1056   ALT 37 12/20/2011 1521   BILITOT 0.5 07/28/2019 1056   BILITOT 0.5 12/20/2011 1521         ASSESSMENT & PLAN:  1. Normocytic anemia   2. Iron deficiency anemia due to chronic blood loss   3. Anemia of chronic kidney failure, stage 3 (moderate)    Labs are reviewed and discussed with patient. Anemia has significantly improved. Hemoglobin is 11.2 today. No need for Retacrit today. Anemia secondary to iron deficiency and chronic kidney disease.  Improved.   Continue monitor CKD, stable.  Stage III.  Avoid nephrotoxins.  CBC in 2 weeks+/-Retacrit Lab MD CBC CMP in  4 weeks, see Dr. Jacinto Reap +/-Retacrit    Earlie Server, MD 09/19/2019 4:11 PM

## 2019-09-25 ENCOUNTER — Telehealth: Payer: Self-pay | Admitting: Internal Medicine

## 2019-09-25 NOTE — Telephone Encounter (Signed)
Need refill for diabetic strips and meter and lancets   Send to [facility]

## 2019-09-25 NOTE — Telephone Encounter (Signed)
Need refill for diabetic strips and meter and lancets not on current medication list

## 2019-09-28 ENCOUNTER — Other Ambulatory Visit: Payer: Self-pay

## 2019-09-28 NOTE — Telephone Encounter (Signed)
Good morning,  I think you sent this to the wrong office pool Thanks

## 2019-09-29 ENCOUNTER — Other Ambulatory Visit: Payer: Self-pay

## 2019-09-29 MED ORDER — GLUCOSE BLOOD VI STRP: ORAL_STRIP | 12 refills | Status: DC

## 2019-09-29 MED ORDER — ONETOUCH ULTRASOFT LANCETS MISC: 12 refills | Status: DC

## 2019-09-29 MED ORDER — BLOOD GLUCOSE MONITOR KIT: PACK | 0 refills | Status: DC

## 2019-09-29 NOTE — Telephone Encounter (Signed)
rx for glucometer printed. Strips and lancets sent to pharmacy. Pt aware

## 2019-09-29 NOTE — Telephone Encounter (Signed)
Pt called back in to follow up on request. Pt says that she has been trying to get this meter and strips since 10/1. Pt says that she has not been able to check her blood sugar.    [facility] [place]    Pt would like to know if someone would make her aware of when Rx has been sent to pharmacy.

## 2019-09-30 ENCOUNTER — Other Ambulatory Visit: Payer: Self-pay

## 2019-09-30 ENCOUNTER — Inpatient Hospital Stay: Payer: Medicare Other

## 2019-09-30 ENCOUNTER — Inpatient Hospital Stay: Payer: Medicare Other | Attending: Internal Medicine

## 2019-09-30 DIAGNOSIS — D649 Anemia, unspecified: Secondary | ICD-10-CM

## 2019-09-30 DIAGNOSIS — D509 Iron deficiency anemia, unspecified: Secondary | ICD-10-CM | POA: Insufficient documentation

## 2019-09-30 LAB — CBC WITH DIFFERENTIAL/PLATELET
Abs Immature Granulocytes: 0.02 10*3/uL (ref 0.00–0.07)
Basophils Absolute: 0.1 10*3/uL (ref 0.0–0.1)
Basophils Relative: 1 %
Eosinophils Absolute: 0.2 10*3/uL (ref 0.0–0.5)
Eosinophils Relative: 3 %
HCT: 36.2 % (ref 36.0–46.0)
Hemoglobin: 11.5 g/dL — ABNORMAL LOW (ref 12.0–15.0)
Immature Granulocytes: 0 %
Lymphocytes Relative: 29 %
Lymphs Abs: 1.8 10*3/uL (ref 0.7–4.0)
MCH: 30.5 pg (ref 26.0–34.0)
MCHC: 31.8 g/dL (ref 30.0–36.0)
MCV: 96 fL (ref 80.0–100.0)
Monocytes Absolute: 0.3 10*3/uL (ref 0.1–1.0)
Monocytes Relative: 5 %
Neutro Abs: 3.8 10*3/uL (ref 1.7–7.7)
Neutrophils Relative %: 62 %
Platelets: 162 10*3/uL (ref 150–400)
RBC: 3.77 MIL/uL — ABNORMAL LOW (ref 3.87–5.11)
RDW: 15.9 % — ABNORMAL HIGH (ref 11.5–15.5)
WBC: 6.2 10*3/uL (ref 4.0–10.5)
nRBC: 0 % (ref 0.0–0.2)

## 2019-09-30 NOTE — Telephone Encounter (Signed)
Script faxed.

## 2019-10-13 NOTE — Progress Notes (Signed)
Called patient no answer left message  

## 2019-10-14 ENCOUNTER — Other Ambulatory Visit: Payer: Self-pay

## 2019-10-14 ENCOUNTER — Inpatient Hospital Stay: Payer: Medicare Other

## 2019-10-14 ENCOUNTER — Inpatient Hospital Stay (HOSPITAL_BASED_OUTPATIENT_CLINIC_OR_DEPARTMENT_OTHER): Payer: Medicare Other | Admitting: Internal Medicine

## 2019-10-14 ENCOUNTER — Encounter: Payer: Self-pay | Admitting: Internal Medicine

## 2019-10-14 DIAGNOSIS — N183 Chronic kidney disease, stage 3 unspecified: Secondary | ICD-10-CM | POA: Diagnosis not present

## 2019-10-14 DIAGNOSIS — D509 Iron deficiency anemia, unspecified: Secondary | ICD-10-CM | POA: Diagnosis not present

## 2019-10-14 DIAGNOSIS — D631 Anemia in chronic kidney disease: Secondary | ICD-10-CM

## 2019-10-14 DIAGNOSIS — D500 Iron deficiency anemia secondary to blood loss (chronic): Secondary | ICD-10-CM

## 2019-10-14 DIAGNOSIS — D649 Anemia, unspecified: Secondary | ICD-10-CM

## 2019-10-14 LAB — CBC WITH DIFFERENTIAL/PLATELET
Abs Immature Granulocytes: 0.01 10*3/uL (ref 0.00–0.07)
Basophils Absolute: 0.1 10*3/uL (ref 0.0–0.1)
Basophils Relative: 1 %
Eosinophils Absolute: 0.2 10*3/uL (ref 0.0–0.5)
Eosinophils Relative: 3 %
HCT: 37 % (ref 36.0–46.0)
Hemoglobin: 11.8 g/dL — ABNORMAL LOW (ref 12.0–15.0)
Immature Granulocytes: 0 %
Lymphocytes Relative: 32 %
Lymphs Abs: 2.1 10*3/uL (ref 0.7–4.0)
MCH: 30.5 pg (ref 26.0–34.0)
MCHC: 31.9 g/dL (ref 30.0–36.0)
MCV: 95.6 fL (ref 80.0–100.0)
Monocytes Absolute: 0.5 10*3/uL (ref 0.1–1.0)
Monocytes Relative: 7 %
Neutro Abs: 3.7 10*3/uL (ref 1.7–7.7)
Neutrophils Relative %: 57 %
Platelets: 158 10*3/uL (ref 150–400)
RBC: 3.87 MIL/uL (ref 3.87–5.11)
RDW: 14.6 % (ref 11.5–15.5)
WBC: 6.5 10*3/uL (ref 4.0–10.5)
nRBC: 0 % (ref 0.0–0.2)

## 2019-10-14 NOTE — Assessment & Plan Note (Addendum)
#  Severe anemia hemoglobin-nadir 6.  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  Cytogenetics normal.; F-ONE-Normal.   #Hemoglobin today is 11.8; hold Retacrit/IV iron.  # CKD stage III-1.31- stable  I spoke at length with the patient's family, Shirlean Mylar- regarding the patient's clinical status/plan of care.  Family agreement.   # DISPOSITION:  # No retacrit today. # in 3 weeks- H&H- Retacrit # in  6 weeks- MD- labs- cbc/bmp; Retacrit- Dr.B

## 2019-11-04 ENCOUNTER — Inpatient Hospital Stay: Payer: Medicare Other | Attending: Internal Medicine

## 2019-11-04 ENCOUNTER — Other Ambulatory Visit: Payer: Self-pay

## 2019-11-04 ENCOUNTER — Inpatient Hospital Stay: Payer: Medicare Other

## 2019-11-04 DIAGNOSIS — N183 Chronic kidney disease, stage 3 unspecified: Secondary | ICD-10-CM | POA: Insufficient documentation

## 2019-11-04 DIAGNOSIS — D500 Iron deficiency anemia secondary to blood loss (chronic): Secondary | ICD-10-CM

## 2019-11-04 DIAGNOSIS — D631 Anemia in chronic kidney disease: Secondary | ICD-10-CM | POA: Insufficient documentation

## 2019-11-04 LAB — HEMOGLOBIN: Hemoglobin: 12.3 g/dL (ref 12.0–15.0)

## 2019-11-04 LAB — HEMATOCRIT: HCT: 38.8 % (ref 36.0–46.0)

## 2019-11-04 NOTE — Progress Notes (Signed)
[place] OFFICE PROGRESS NOTE  Patient Care Team: Einar Pheasant, MD as PCP - General (Internal Medicine) Einar Pheasant, MD (Internal Medicine) Leia Alf, MD (Inactive) as Referring Physician (Internal Medicine) Isaias Cowman, MD (Internal Medicine) Albesa Seen, MD (Unknown Physician Specialty) Philis Kendall, MD (Unknown Physician Specialty) Brendolyn Patty, MD (Specialist) Schnier, Dolores Lory, MD (Vascular Surgery) Leia Alf, MD (Inactive) as Referring Physician (Internal Medicine) Isaias Cowman, MD (Internal Medicine) Albesa Seen, MD (Unknown Physician Specialty) Philis Kendall, MD (Unknown Physician Specialty) Brendolyn Patty, MD (Specialist) Schnier, Dolores Lory, MD (Vascular Surgery)   SUMMARY OF HEMATOLOGIC HISTORY:  # IRON DEFICIENCY ANEMIA- recurrent [? GI blood loss; Dr.Elliot; AVM-capsule]; IV Ferrahem q 69m last colo- 2012/march; EGD-- [id number]  July 2020 bone marrow biopsy-mild dysplastic changes; 40% hypercellularity; cytogenetics-normal.  #Colonoscopy-April 2020 aborted because of cardiac pause.  # CKD stage III-IV; CAD-Dr. PSaralyn Pilar INTERVAL HISTORY:   85-year-old female patient with above history of recurrent iron deficiency anemia unclear etiology -related to CKD is here for follow-up.  Patient states her fatigue is improved; not completely resolved.  Chronic shortness of breath-especially with exertion.  Review of Systems  Constitutional: Positive for malaise/fatigue. Negative for chills, diaphoresis, fever and weight loss.  HENT: Negative for nosebleeds and sore throat.   Eyes: Negative for double vision.  Respiratory: Positive for shortness of breath. Negative for cough, hemoptysis, sputum production and wheezing.   Cardiovascular: Negative for palpitations, orthopnea and leg swelling.  Gastrointestinal: Negative for abdominal pain, blood in stool, constipation, diarrhea, heartburn, melena, nausea and vomiting.   Genitourinary: Negative for dysuria, frequency and urgency.  Musculoskeletal: Negative for back pain and joint pain.  Skin: Negative.  Negative for itching and rash.  Neurological: Negative for dizziness, tingling, focal weakness, weakness and headaches.  Endo/Heme/Allergies: Does not bruise/bleed easily.  Psychiatric/Behavioral: Negative for depression. The patient is not nervous/anxious and does not have insomnia.      PAST MEDICAL HISTORY :  Past Medical History:  Diagnosis Date  . Arthritis   . AV malformation of gastrointestinal tract   . Blood in stool   . CAD (coronary artery disease)   . Carotid arterial disease ([place]   . Cataracts, bilateral   . Chronic blood loss anemia   . Chronic cystitis   . Complication of anesthesia    reaction to propofol  . Depression   . Diabetes mellitus ([place]   . GERD (gastroesophageal reflux disease)   . Hyperlipidemia   . Hypertension   . IDA (iron deficiency anemia)   . Stress incontinence     PAST SURGICAL HISTORY :   Past Surgical History:  Procedure Laterality Date  . ABDOMINAL HYSTERECTOMY  1972   ovaries left in place  . APPENDECTOMY  1992  . BACK SURGERY  06/08/2010   L3, L4, L5  . CAROTID ARTERY ANGIOPLASTY  11/03  . [place] 89   right then left   . CHOLECYSTECTOMY  1993  . COLONOSCOPY WITH PROPOFOL N/A 02/02/2019   Procedure: COLONOSCOPY WITH PROPOFOL;  Surgeon: EManya Silvas MD;  [facility];  Service: Endoscopy;  Laterality: N/A;  . ESOPHAGOGASTRODUODENOSCOPY N/A 02/02/2019   Procedure: ESOPHAGOGASTRODUODENOSCOPY (EGD);  Surgeon: EManya Silvas MD;  [facility];  Service: Endoscopy;  Laterality: N/A;  . FOOT SURGERY  06/2007   Left  . right carotid artery surgery  01/09/13   Dr. SDelana Meyer@ [facility]  . TOTAL HIP ARTHROPLASTY  05/03/2011   left 12, right 11/07  FAMILY HISTORY :   Family History  Problem Relation Age of Onset  . Hyperlipidemia Father   . Heart  disease Father        myocardial infarction  . Hypertension Father        Parent  . Arthritis Other        parent  . Diabetes Other        nephew  . Cervical cancer Sister   . Rectal cancer Sister   . Breast cancer Neg Hx     SOCIAL HISTORY:   Social History   Tobacco Use  . Smoking status: Former Smoker    Types: Cigarettes    Quit date: 11/19/1989    Years since quitting: 29.9  . Smokeless tobacco: Never Used  Substance Use Topics  . Alcohol use: No    Alcohol/week: 0.0 standard drinks  . Drug use: No    ALLERGIES:  is allergic to ciprofloxacin; levaquin [levofloxacin]; propofol; and tequin [gatifloxacin].  MEDICATIONS:  Current Outpatient Medications  Medication Sig Dispense Refill  . aspirin 81 MG tablet Take 81 mg by mouth daily.    . Biotin 5000 MCG CAPS Take 1 capsule by mouth daily.    . blood glucose meter kit and supplies KIT Dispense based on insurance preference. Use daily to check sugars once daily. Dx e11.9 1 each 0  . cetirizine (ZYRTEC) 10 MG tablet Take 10 mg by mouth daily.    Marland Kitchen glucose blood test strip Use as instructed to check blood sugars once daily. Dx e11.9 100 each 12  . isosorbide mononitrate (IMDUR) 30 MG 24 hr tablet Take 30 mg by mouth daily.     . Lancets (ONETOUCH ULTRASOFT) lancets Use as instructed to check blood sugars once daily. DX e 11.9 100 each 12  . Omega-3 Fatty Acids (FISH OIL) 1200 MG CAPS Take 1 capsule by mouth daily.    Marland Kitchen omeprazole (PRILOSEC) 40 MG capsule TAKE 1 TABLET 30 MINS BEFORE BREAKFAST AND 1 TABLET 30 MINS BEFORE DINNER.    Marland Kitchen rosuvastatin (CRESTOR) 40 MG tablet TAKE 1 TABLET DAILY 90 tablet 3  . sucralfate (CARAFATE) 1 g tablet Take 1 g by mouth 3 (three) times daily. TID    . traZODone (DESYREL) 50 MG tablet TAKE 0.5-1 TABLETS (25-50 MG TOTAL) BY MOUTH AT BEDTIME AS NEEDED FOR SLEEP. 90 tablet 1   No current facility-administered medications for this visit.    PHYSICAL EXAMINATION:   BP 134/63 (BP Location:  Left Arm, Patient Position: Sitting, Cuff Size: Normal)   Pulse 79   Temp 97.6 F (36.4 C) (Tympanic)   Wt 138 lb (62.6 kg)   BMI 27.87 kg/m   Filed Weights   10/14/19 1407  Weight: 138 lb (62.6 kg)    Physical Exam  Constitutional: She is oriented to person, place, and time and well-developed, well-nourished, and in no distress.  Frail-appearing Caucasian female patient.  She is in a wheelchair.  HENT:  Head: Normocephalic and atraumatic.  Mouth/Throat: Oropharynx is clear and moist. No oropharyngeal exudate.  Eyes: Pupils are equal, round, and reactive to light.  Positive for pallor.  Cardiovascular: Normal rate and regular rhythm.  Pulmonary/Chest: No respiratory distress. She has no wheezes.  Decreased air entry at the bases.  Abdominal: Soft. Bowel sounds are normal. She exhibits no distension and no mass. There is no abdominal tenderness. There is no rebound and no guarding.  Musculoskeletal:        General: No tenderness or edema. Normal  range of motion.     Cervical back: Normal range of motion and neck supple.  Neurological: She is alert and oriented to person, place, and time.  Skin: Skin is warm.  Psychiatric: Affect normal.    LABORATORY DATA:  I have reviewed the data as listed    Component Value Date/Time   NA 140 07/28/2019 1056   NA 143 01/10/2013 0314   K 4.2 07/28/2019 1056   K 4.0 01/10/2013 0314   CL 108 07/28/2019 1056   CL 109 (H) 01/10/2013 0314   CO2 24 07/28/2019 1056   CO2 27 01/10/2013 0314   GLUCOSE 129 (H) 07/28/2019 1056   GLUCOSE 119 (H) 01/10/2013 0314   BUN 18 07/28/2019 1056   BUN 15 01/10/2013 0314   CREATININE 1.31 (H) 07/28/2019 1056   CREATININE 1.03 01/10/2013 0314   CREATININE 1.13 (H) 09/18/2012 0848   CALCIUM 8.9 07/28/2019 1056   CALCIUM 8.1 (L) 01/10/2013 0314   PROT 6.8 07/28/2019 1056   PROT 7.2 12/20/2011 1521   ALBUMIN 3.9 07/28/2019 1056   ALBUMIN 3.9 12/20/2011 1521   AST 14 07/28/2019 1056   AST 31  12/20/2011 1521   ALT 11 07/28/2019 1056   ALT 37 12/20/2011 1521   ALKPHOS 78 07/28/2019 1056   ALKPHOS 57 12/20/2011 1521   BILITOT 0.5 07/28/2019 1056   BILITOT 0.5 12/20/2011 1521   GFRNONAA 32 (L) 06/22/2019 1410   GFRNONAA 52 (L) 01/10/2013 0314   GFRNONAA 47 (L) 09/18/2012 0848   GFRAA 37 (L) 06/22/2019 1410   GFRAA >60 01/10/2013 0314   GFRAA 54 (L) 09/18/2012 0848    No results found for: SPEP, UPEP  Lab Results  Component Value Date   WBC 6.5 10/14/2019   NEUTROABS 3.7 10/14/2019   HGB 11.8 (L) 10/14/2019   HCT 37.0 10/14/2019   MCV 95.6 10/14/2019   PLT 158 10/14/2019      Chemistry      Component Value Date/Time   NA 140 07/28/2019 1056   NA 143 01/10/2013 0314   K 4.2 07/28/2019 1056   K 4.0 01/10/2013 0314   CL 108 07/28/2019 1056   CL 109 (H) 01/10/2013 0314   CO2 24 07/28/2019 1056   CO2 27 01/10/2013 0314   BUN 18 07/28/2019 1056   BUN 15 01/10/2013 0314   CREATININE 1.31 (H) 07/28/2019 1056   CREATININE 1.03 01/10/2013 0314   CREATININE 1.13 (H) 09/18/2012 0848      Component Value Date/Time   CALCIUM 8.9 07/28/2019 1056   CALCIUM 8.1 (L) 01/10/2013 0314   ALKPHOS 78 07/28/2019 1056   ALKPHOS 57 12/20/2011 1521   AST 14 07/28/2019 1056   AST 31 12/20/2011 1521   ALT 11 07/28/2019 1056   ALT 37 12/20/2011 1521   BILITOT 0.5 07/28/2019 1056   BILITOT 0.5 12/20/2011 1521         ASSESSMENT & PLAN:   Anemia of chronic kidney failure, stage 3 (moderate) (HCC) #Severe anemia hemoglobin-nadir 6.  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  Cytogenetics normal.; F-ONE-Normal.   #Hemoglobin today is 11.8; hold Retacrit/IV iron.  # CKD stage III-1.49 stable  I spoke at length with the patient's family, Shirlean Mylar- regarding the patient's clinical status/plan of care.  Family agreement.   # DISPOSITION:  # No retacrit today. # in 3 weeks- H&H- Retacrit # in   6 weeks- MD- labs- cbc/bmp; Retacrit- Dr.B  Cammie Sickle, MD 11/04/2019 1:22 PM

## 2019-11-26 ENCOUNTER — Inpatient Hospital Stay: Payer: Medicare Other | Attending: Internal Medicine

## 2019-11-26 ENCOUNTER — Inpatient Hospital Stay: Payer: Medicare Other

## 2019-11-26 ENCOUNTER — Other Ambulatory Visit: Payer: Self-pay

## 2019-11-26 ENCOUNTER — Inpatient Hospital Stay (HOSPITAL_BASED_OUTPATIENT_CLINIC_OR_DEPARTMENT_OTHER): Payer: Medicare Other | Admitting: Internal Medicine

## 2019-11-26 DIAGNOSIS — D500 Iron deficiency anemia secondary to blood loss (chronic): Secondary | ICD-10-CM

## 2019-11-26 DIAGNOSIS — D631 Anemia in chronic kidney disease: Secondary | ICD-10-CM | POA: Diagnosis present

## 2019-11-26 DIAGNOSIS — N183 Chronic kidney disease, stage 3 unspecified: Secondary | ICD-10-CM | POA: Insufficient documentation

## 2019-11-26 LAB — CBC WITH DIFFERENTIAL/PLATELET
Abs Immature Granulocytes: 0.01 10*3/uL (ref 0.00–0.07)
Basophils Absolute: 0.1 10*3/uL (ref 0.0–0.1)
Basophils Relative: 1 %
Eosinophils Absolute: 0.2 10*3/uL (ref 0.0–0.5)
Eosinophils Relative: 4 %
HCT: 38.4 % (ref 36.0–46.0)
Hemoglobin: 12.3 g/dL (ref 12.0–15.0)
Immature Granulocytes: 0 %
Lymphocytes Relative: 30 %
Lymphs Abs: 1.6 10*3/uL (ref 0.7–4.0)
MCH: 30.7 pg (ref 26.0–34.0)
MCHC: 32 g/dL (ref 30.0–36.0)
MCV: 95.8 fL (ref 80.0–100.0)
Monocytes Absolute: 0.4 10*3/uL (ref 0.1–1.0)
Monocytes Relative: 7 %
Neutro Abs: 3 10*3/uL (ref 1.7–7.7)
Neutrophils Relative %: 58 %
Platelets: 178 10*3/uL (ref 150–400)
RBC: 4.01 MIL/uL (ref 3.87–5.11)
RDW: 13.2 % (ref 11.5–15.5)
WBC: 5.3 10*3/uL (ref 4.0–10.5)
nRBC: 0 % (ref 0.0–0.2)

## 2019-11-26 LAB — BASIC METABOLIC PANEL
Anion gap: 9 (ref 5–15)
BUN: 24 mg/dL — ABNORMAL HIGH (ref 8–23)
CO2: 24 mmol/L (ref 22–32)
Calcium: 9.3 mg/dL (ref 8.9–10.3)
Chloride: 104 mmol/L (ref 98–111)
Creatinine, Ser: 1.18 mg/dL — ABNORMAL HIGH (ref 0.44–1.00)
GFR calc Af Amer: 49 mL/min — ABNORMAL LOW (ref >60)
GFR calc non Af Amer: 42 mL/min — ABNORMAL LOW (ref >60)
Glucose, Bld: 182 mg/dL — ABNORMAL HIGH (ref 70–99)
Potassium: 3.9 mmol/L (ref 3.5–5.1)
Sodium: 137 mmol/L (ref 135–145)

## 2019-11-26 NOTE — Patient Instructions (Signed)
[address] [place], [place] [address]- covid vaccine.

## 2019-11-26 NOTE — Assessment & Plan Note (Signed)
#  Severe anemia hemoglobin-nadir 6. Likely CKD [ Bone marrow biopsy-mild dyserythropoietic changes].   #Hemoglobin today is  12.3;  hold Retacrit/IV iron.  # CKD stage III-1.31- stable  # # I discussed regarding Covid precautions/and also discussed proceeding with Covid vaccination when available.  Discussed that unfortunately the data safety and efficacy of vaccination is unclear especially in patients with immunocompromised state.  However, I think the benefits of the vaccination outweigh the potential risks.  # I LVM for patient's daughter, Shirlean Mylar- regarding the patient's clinical status/plan of care.  Family agreement.   # DISPOSITION:  # No retacrit today. # in 4 weeks- H&H- Retacrit # in  8weeks- MD- labs- cbc/bmp; iron studies/ferritin; Retacrit- Dr.B

## 2019-11-26 NOTE — Progress Notes (Signed)
[facility] OFFICE PROGRESS NOTE  Patient Care Team: Einar Pheasant, MD as PCP - General (Internal Medicine) Einar Pheasant, MD (Internal Medicine) Isaias Cowman, MD (Internal Medicine) Albesa Seen, MD (Unknown Physician Specialty) Brendolyn Patty, MD (Specialist) Schnier, Dolores Lory, MD (Vascular Surgery) Leia Alf, MD (Inactive) as Referring Physician (Internal Medicine) Isaias Cowman, MD (Internal Medicine) Albesa Seen, MD (Unknown Physician Specialty) Philis Kendall, MD (Unknown Physician Specialty) Brendolyn Patty, MD (Specialist) Schnier, Dolores Lory, MD (Vascular Surgery)   SUMMARY OF HEMATOLOGIC HISTORY:  # IRON DEFICIENCY ANEMIA- recurrent [? GI blood loss; Dr.Elliot; AVM-capsule]; IV Ferrahem q 46m last colo- 2012/march; EGD-- [id number]  July 2020 bone marrow biopsy-mild dysplastic changes; 40% hypercellularity; cytogenetics-normal.  #Colonoscopy-April 2020 aborted because of cardiac pause.  # CKD stage III-IV; CAD-Dr. PSaralyn Pilar INTERVAL HISTORY:   86-year-old female patient with above history of recurrent iron deficiency anemia unclear etiology -related to CKD is here for follow-up.  Patient's shortness of breath is improved.  Fatigue improved.  No blood in stools or black or stools.  Review of Systems  Constitutional: Positive for malaise/fatigue. Negative for chills, diaphoresis, fever and weight loss.  HENT: Negative for nosebleeds and sore throat.   Eyes: Negative for double vision.  Respiratory: Negative for cough, hemoptysis, sputum production and wheezing.   Cardiovascular: Negative for palpitations, orthopnea and leg swelling.  Gastrointestinal: Negative for abdominal pain, blood in stool, constipation, diarrhea, heartburn, melena, nausea and vomiting.  Genitourinary: Negative for dysuria, frequency and urgency.  Musculoskeletal: Negative for back pain and joint pain.  Skin: Negative.  Negative for itching and rash.   Neurological: Negative for dizziness, tingling, focal weakness, weakness and headaches.  Endo/Heme/Allergies: Does not bruise/bleed easily.  Psychiatric/Behavioral: Negative for depression. The patient is not nervous/anxious and does not have insomnia.      PAST MEDICAL HISTORY :  Past Medical History:  Diagnosis Date  . Arthritis   . AV malformation of gastrointestinal tract   . Blood in stool   . CAD (coronary artery disease)   . [place] arterial disease [place]   . Cataracts, bilateral   . Chronic blood loss anemia   . Chronic cystitis   . Complication of anesthesia    reaction to propofol  . Depression   . Diabetes mellitus ([place]   . GERD (gastroesophageal reflux disease)   . Hyperlipidemia   . Hypertension   . IDA (iron deficiency anemia)   . Stress incontinence     PAST SURGICAL HISTORY :   Past Surgical History:  Procedure Laterality Date  . ABDOMINAL HYSTERECTOMY  1972   ovaries left in place  . APPENDECTOMY  1992  . BACK SURGERY  06/08/2010   L3, L4, L5  . CAROTID ARTERY ANGIOPLASTY  11[place]03  [place]   right then left   . CHOLECYSTECTOMY  1993  . COLONOSCOPY WITH PROPOFOL N/A 02/02/2019   Procedure: COLONOSCOPY WITH PROPOFOL;  Surgeon: EManya Silvas MD;  [facility];  Service: Endoscopy;  Laterality: N/A;  . ESOPHAGOGASTRODUODENOSCOPY N/A 02/02/2019   Procedure: ESOPHAGOGASTRODUODENOSCOPY (EGD);  Surgeon: EManya Silvas MD;  [facility];  Service: Endoscopy;  Laterality: N/A;  . FOOT SURGERY  06/2007   Left  . right carotid artery surgery  01/09/13   Dr. SDelana Meyer@ [facility]  . TOTAL HIP ARTHROPLASTY  05/03/2011   left 12, right 11/07    FAMILY HISTORY :   Family History  Problem Relation Age of Onset  . Hyperlipidemia  Father   . Heart disease Father        myocardial infarction  . Hypertension Father        Parent  . Arthritis Other        parent  . Diabetes Other        nephew  . Cervical  cancer Sister   . Rectal cancer Sister   . Breast cancer Neg Hx     SOCIAL HISTORY:   Social History   Tobacco Use  . Smoking status: Former Smoker    Types: Cigarettes    Quit date: 11/19/1989    Years since quitting: 30.0  . Smokeless tobacco: Never Used  Substance Use Topics  . Alcohol use: No    Alcohol/week: 0.0 standard drinks  . Drug use: No    ALLERGIES:  is allergic to ciprofloxacin; levaquin [levofloxacin]; propofol; and tequin [gatifloxacin].  MEDICATIONS:  Current Outpatient Medications  Medication Sig Dispense Refill  . aspirin 81 MG tablet Take 81 mg by mouth daily.    . Biotin 5000 MCG CAPS Take 1 capsule by mouth daily.    . blood glucose meter kit and supplies KIT Dispense based on insurance preference. Use daily to check sugars once daily. Dx e11.9 1 each 0  . cetirizine (ZYRTEC) 10 MG tablet Take 10 mg by mouth daily.    Marland Kitchen glucose blood test strip Use as instructed to check blood sugars once daily. Dx e11.9 100 each 12  . isosorbide mononitrate (IMDUR) 30 MG 24 hr tablet Take 30 mg by mouth daily.     . Lancets (ONETOUCH ULTRASOFT) lancets Use as instructed to check blood sugars once daily. DX e 11.9 100 each 12  . Omega-3 Fatty Acids (FISH OIL) 1200 MG CAPS Take 1 capsule by mouth daily.    Marland Kitchen omeprazole (PRILOSEC) 40 MG capsule TAKE 1 TABLET 30 MINS BEFORE BREAKFAST AND 1 TABLET 30 MINS BEFORE DINNER.    Marland Kitchen rosuvastatin (CRESTOR) 40 MG tablet TAKE 1 TABLET DAILY 90 tablet 3  . sucralfate (CARAFATE) 1 g tablet Take 1 g by mouth 3 (three) times daily. TID    . traZODone (DESYREL) 50 MG tablet TAKE 0.5-1 TABLETS (25-50 MG TOTAL) BY MOUTH AT BEDTIME AS NEEDED FOR SLEEP. 90 tablet 1   No current facility-administered medications for this visit.    PHYSICAL EXAMINATION:   BP 131/72 (BP Location: Left Arm, Patient Position: Sitting, Cuff Size: Normal)   Pulse 79   Temp (!) 96.6 F (35.9 C) (Tympanic)   Wt 138 lb (62.6 kg)   BMI 27.87 kg/m   Filed Weights    11/26/19 1018  Weight: 138 lb (62.6 kg)    Physical Exam  Constitutional: She is oriented to person, place, and time and well-developed, well-nourished, and in no distress.  Frail-appearing Caucasian female patient.  She is in a wheelchair.  HENT:  Head: Normocephalic and atraumatic.  Mouth/Throat: Oropharynx is clear and moist. No oropharyngeal exudate.  Eyes: Pupils are equal, round, and reactive to light.  Positive for pallor.  Cardiovascular: Normal rate and regular rhythm.  Pulmonary/Chest: No respiratory distress. She has no wheezes.  Decreased air entry at the bases.  Abdominal: Soft. Bowel sounds are normal. She exhibits no distension and no mass. There is no abdominal tenderness. There is no rebound and no guarding.  Musculoskeletal:        General: No tenderness or edema. Normal range of motion.     Cervical back: Normal range of motion and neck  supple.  Neurological: She is alert and oriented to person, place, and time.  Skin: Skin is warm.  Psychiatric: Affect normal.    LABORATORY DATA:  I have reviewed the data as listed    Component Value Date/Time   NA 137 11/26/2019 0959   NA 143 01/10/2013 0314   K 3.9 11/26/2019 0959   K 4.0 01/10/2013 0314   CL 104 11/26/2019 0959   CL 109 (H) 01/10/2013 0314   CO2 24 11/26/2019 0959   CO2 27 01/10/2013 0314   GLUCOSE 182 (H) 11/26/2019 0959   GLUCOSE 119 (H) 01/10/2013 0314   BUN 24 (H) 11/26/2019 0959   BUN 15 01/10/2013 0314   CREATININE 1.18 (H) 11/26/2019 0959   CREATININE 1.03 01/10/2013 0314   CREATININE 1.13 (H) 09/18/2012 0848   CALCIUM 9.3 11/26/2019 0959   CALCIUM 8.1 (L) 01/10/2013 0314   PROT 6.8 07/28/2019 1056   PROT 7.2 12/20/2011 1521   ALBUMIN 3.9 07/28/2019 1056   ALBUMIN 3.9 12/20/2011 1521   AST 14 07/28/2019 1056   AST 31 12/20/2011 1521   ALT 11 07/28/2019 1056   ALT 37 12/20/2011 1521   ALKPHOS 78 07/28/2019 1056   ALKPHOS 57 12/20/2011 1521   BILITOT 0.5 07/28/2019 1056   BILITOT  0.5 12/20/2011 1521   GFRNONAA 42 (L) 11/26/2019 0959   GFRNONAA 52 (L) 01/10/2013 0314   GFRNONAA 47 (L) 09/18/2012 0848   GFRAA 49 (L) 11/26/2019 0959   GFRAA >60 01/10/2013 0314   GFRAA 54 (L) 09/18/2012 0848    No results found for: SPEP, UPEP  Lab Results  Component Value Date   WBC 5.3 11/26/2019   NEUTROABS 3.0 11/26/2019   HGB 12.3 11/26/2019   HCT 38.4 11/26/2019   MCV 95.8 11/26/2019   PLT 178 11/26/2019      Chemistry      Component Value Date/Time   NA 137 11/26/2019 0959   NA 143 01/10/2013 0314   K 3.9 11/26/2019 0959   K 4.0 01/10/2013 0314   CL 104 11/26/2019 0959   CL 109 (H) 01/10/2013 0314   CO2 24 11/26/2019 0959   CO2 27 01/10/2013 0314   BUN 24 (H) 11/26/2019 0959   BUN 15 01/10/2013 0314   CREATININE 1.18 (H) 11/26/2019 0959   CREATININE 1.03 01/10/2013 0314   CREATININE 1.13 (H) 09/18/2012 0848      Component Value Date/Time   CALCIUM 9.3 11/26/2019 0959   CALCIUM 8.1 (L) 01/10/2013 0314   ALKPHOS 78 07/28/2019 1056   ALKPHOS 57 12/20/2011 1521   AST 14 07/28/2019 1056   AST 31 12/20/2011 1521   ALT 11 07/28/2019 1056   ALT 37 12/20/2011 1521   BILITOT 0.5 07/28/2019 1056   BILITOT 0.5 12/20/2011 1521         ASSESSMENT & PLAN:   Anemia of chronic kidney failure, stage 3 (moderate) (HCC) #Severe anemia hemoglobin-nadir 6. Likely CKD [ Bone marrow biopsy-mild dyserythropoietic changes].   #Hemoglobin today is  12.3;  hold Retacrit/IV iron.  # CKD stage III-1.31- stable  # # I discussed regarding Covid precautions/and also discussed proceeding with Covid vaccination when available.  Discussed that unfortunately the data safety and efficacy of vaccination is unclear especially in patients with immunocompromised state.  However, I think the benefits of the vaccination outweigh the potential risks.  # I LVM for patient's daughter, Shirlean Mylar- regarding the patient's clinical status/plan of care.  Family agreement.   # DISPOSITION:  #  No  retacrit today. # in 4 weeks- H&H- Retacrit # in  8weeks- MD- labs- cbc/bmp; iron studies/ferritin; Retacrit- Dr.B     Cammie Sickle, MD 11/27/2019 9:44 AM

## 2019-12-15 ENCOUNTER — Ambulatory Visit: Payer: Medicare Other | Attending: Internal Medicine

## 2019-12-15 DIAGNOSIS — Z20822 Contact with and (suspected) exposure to covid-19: Secondary | ICD-10-CM

## 2019-12-16 LAB — NOVEL CORONAVIRUS, NAA: SARS-CoV-2, NAA: NOT DETECTED

## 2019-12-23 ENCOUNTER — Inpatient Hospital Stay: Payer: Medicare Other

## 2019-12-24 ENCOUNTER — Other Ambulatory Visit: Payer: Medicare Other

## 2019-12-24 ENCOUNTER — Ambulatory Visit: Payer: Medicare Other

## 2019-12-29 ENCOUNTER — Other Ambulatory Visit: Payer: Self-pay

## 2019-12-30 ENCOUNTER — Inpatient Hospital Stay: Payer: Medicare Other

## 2019-12-30 ENCOUNTER — Other Ambulatory Visit: Payer: Self-pay

## 2019-12-30 ENCOUNTER — Inpatient Hospital Stay: Payer: Medicare Other | Attending: Internal Medicine

## 2019-12-30 DIAGNOSIS — N183 Chronic kidney disease, stage 3 unspecified: Secondary | ICD-10-CM

## 2019-12-30 DIAGNOSIS — D631 Anemia in chronic kidney disease: Secondary | ICD-10-CM | POA: Diagnosis present

## 2019-12-30 LAB — HEMATOCRIT: HCT: 39.4 % (ref 36.0–46.0)

## 2019-12-30 LAB — HEMOGLOBIN: Hemoglobin: 12.4 g/dL (ref 12.0–15.0)

## 2020-01-15 ENCOUNTER — Ambulatory Visit: Payer: Medicare Other | Attending: Internal Medicine

## 2020-01-15 DIAGNOSIS — Z23 Encounter for immunization: Secondary | ICD-10-CM | POA: Insufficient documentation

## 2020-01-15 NOTE — Progress Notes (Signed)
   Covid-19 Vaccination Clinic  Name:  Jocelyn Elliott    MRN: [id number] DOB: Jun 11, 1932  01/15/2020  [name] was observed post Covid-19 immunization for 15 minutes without incidence. She was provided with Vaccine Information Sheet and instruction to access the V-Safe system.   [name] was instructed to call 911 with any severe reactions post vaccine: Marland Kitchen Difficulty breathing  . Swelling of your face and throat  . A fast heartbeat  . A bad rash all over your body  . Dizziness and weakness    Immunizations Administered    Name Date Dose VIS Date Route   Pfizer COVID-19 Vaccine 01/15/2020 11:17 AM 0.3 mL 10/30/2019 Intramuscular   Manufacturer: [place]   Lot: [id number]   [place]: [id number]

## 2020-01-20 ENCOUNTER — Inpatient Hospital Stay: Payer: Medicare Other | Attending: Internal Medicine

## 2020-01-20 ENCOUNTER — Encounter: Payer: Self-pay | Admitting: Internal Medicine

## 2020-01-20 ENCOUNTER — Other Ambulatory Visit: Payer: Self-pay

## 2020-01-20 ENCOUNTER — Inpatient Hospital Stay: Payer: Medicare Other

## 2020-01-20 ENCOUNTER — Inpatient Hospital Stay (HOSPITAL_BASED_OUTPATIENT_CLINIC_OR_DEPARTMENT_OTHER): Payer: Medicare Other | Admitting: Internal Medicine

## 2020-01-20 DIAGNOSIS — D631 Anemia in chronic kidney disease: Secondary | ICD-10-CM | POA: Insufficient documentation

## 2020-01-20 DIAGNOSIS — N183 Chronic kidney disease, stage 3 unspecified: Secondary | ICD-10-CM

## 2020-01-20 LAB — BASIC METABOLIC PANEL
Anion gap: 10 (ref 5–15)
BUN: 27 mg/dL — ABNORMAL HIGH (ref 8–23)
CO2: 23 mmol/L (ref 22–32)
Calcium: 9.3 mg/dL (ref 8.9–10.3)
Chloride: 105 mmol/L (ref 98–111)
Creatinine, Ser: 1.27 mg/dL — ABNORMAL HIGH (ref 0.44–1.00)
GFR calc Af Amer: 45 mL/min — ABNORMAL LOW (ref >60)
GFR calc non Af Amer: 38 mL/min — ABNORMAL LOW (ref >60)
Glucose, Bld: 160 mg/dL — ABNORMAL HIGH (ref 70–99)
Potassium: 4.5 mmol/L (ref 3.5–5.1)
Sodium: 138 mmol/L (ref 135–145)

## 2020-01-20 LAB — CBC WITH DIFFERENTIAL/PLATELET
Abs Immature Granulocytes: 0.01 10*3/uL (ref 0.00–0.07)
Basophils Absolute: 0.1 10*3/uL (ref 0.0–0.1)
Basophils Relative: 1 %
Eosinophils Absolute: 0.2 10*3/uL (ref 0.0–0.5)
Eosinophils Relative: 3 %
HCT: 39.6 % (ref 36.0–46.0)
Hemoglobin: 12.6 g/dL (ref 12.0–15.0)
Immature Granulocytes: 0 %
Lymphocytes Relative: 28 %
Lymphs Abs: 1.6 10*3/uL (ref 0.7–4.0)
MCH: 30.1 pg (ref 26.0–34.0)
MCHC: 31.8 g/dL (ref 30.0–36.0)
MCV: 94.5 fL (ref 80.0–100.0)
Monocytes Absolute: 0.4 10*3/uL (ref 0.1–1.0)
Monocytes Relative: 6 %
Neutro Abs: 3.6 10*3/uL (ref 1.7–7.7)
Neutrophils Relative %: 62 %
Platelets: 164 10*3/uL (ref 150–400)
RBC: 4.19 MIL/uL (ref 3.87–5.11)
RDW: 14.1 % (ref 11.5–15.5)
WBC: 5.9 10*3/uL (ref 4.0–10.5)
nRBC: 0 % (ref 0.0–0.2)

## 2020-01-20 LAB — IRON AND TIBC
Iron: 51 ug/dL (ref 28–170)
Saturation Ratios: 13 % (ref 10.4–31.8)
TIBC: 398 ug/dL (ref 250–450)
UIBC: 347 ug/dL

## 2020-01-20 LAB — FERRITIN: Ferritin: 13 ng/mL (ref 11–307)

## 2020-01-20 NOTE — Patient Instructions (Signed)
#   VITRON C One a day [iron tablet]- over the counter

## 2020-01-20 NOTE — Progress Notes (Signed)
[facility] OFFICE PROGRESS NOTE  Patient Care Team: Einar Pheasant, MD as PCP - General (Internal Medicine) Einar Pheasant, MD (Internal Medicine) Isaias Cowman, MD (Internal Medicine) Albesa Seen, MD (Unknown Physician Specialty) Brendolyn Patty, MD (Specialist) Schnier, Dolores Lory, MD (Vascular Surgery) Leia Alf, MD (Inactive) as Referring Physician (Internal Medicine) Isaias Cowman, MD (Internal Medicine) Albesa Seen, MD (Unknown Physician Specialty) Philis Kendall, MD (Unknown Physician Specialty) Brendolyn Patty, MD (Specialist) Schnier, Dolores Lory, MD (Vascular Surgery)   SUMMARY OF HEMATOLOGIC HISTORY:  # IRON DEFICIENCY ANEMIA- recurrent [? GI blood loss; Dr.Elliot; AVM-capsule]; IV Ferrahem q 12m last colo- 2012/march; EGD-- [id number]  July 2020 bone marrow biopsy-mild dysplastic changes; 40% hypercellularity; cytogenetics-normal.-Retacrit.  #Colonoscopy-April 2020 aborted because of cardiac pause.  # CKD stage III-IV; CAD-Dr. PSaralyn Pilar INTERVAL HISTORY:   86-year-old female patient with above history of recurrent iron deficiency anemia unclear etiology -related to CKD is here for follow-up.  Energy levels are good.  Denies any weight loss but denies any blood in stools or black or stools.  No nausea no vomiting.   Review of Systems  Constitutional: Positive for malaise/fatigue. Negative for chills, diaphoresis, fever and weight loss.  HENT: Negative for nosebleeds and sore throat.   Eyes: Negative for double vision.  Respiratory: Negative for cough, hemoptysis, sputum production and wheezing.   Cardiovascular: Negative for palpitations, orthopnea and leg swelling.  Gastrointestinal: Negative for abdominal pain, blood in stool, constipation, diarrhea, heartburn, melena, nausea and vomiting.  Genitourinary: Negative for dysuria, frequency and urgency.  Musculoskeletal: Negative for back pain and joint pain.  Skin: Negative.   Negative for itching and rash.  Neurological: Negative for dizziness, tingling, focal weakness, weakness and headaches.  Endo/Heme/Allergies: Does not bruise/bleed easily.  Psychiatric/Behavioral: Negative for depression. The patient is not nervous/anxious and does not have insomnia.      PAST MEDICAL HISTORY :  Past Medical History:  Diagnosis Date  . Arthritis   . AV malformation of gastrointestinal tract   . Blood in stool   . CAD (coronary artery disease)   . [place] arterial disease [place]   . Cataracts, bilateral   . Chronic blood loss anemia   . Chronic cystitis   . Complication of anesthesia    reaction to propofol  . Depression   . Diabetes mellitus ([place]   . GERD (gastroesophageal reflux disease)   . Hyperlipidemia   . Hypertension   . IDA (iron deficiency anemia)   . Stress incontinence     PAST SURGICAL HISTORY :   Past Surgical History:  Procedure Laterality Date  . ABDOMINAL HYSTERECTOMY  1972   ovaries left in place  . APPENDECTOMY  1992  . BACK SURGERY  06/08/2010   L3, L4, L5  . CAROTID ARTERY ANGIOPLASTY  [place]03  [place]   [place]   . CHOLECYSTECTOMY  1993  . COLONOSCOPY WITH PROPOFOL N/A 02/02/2019   Procedure: COLONOSCOPY WITH PROPOFOL;  Surgeon: EManya Silvas MD;  [facility];  Service: Endoscopy;  Laterality: N/A;  . ESOPHAGOGASTRODUODENOSCOPY N/A 02/02/2019   Procedure: ESOPHAGOGASTRODUODENOSCOPY (EGD);  Surgeon: EManya Silvas MD;  [facility];  Service: Endoscopy;  Laterality: N/A;  . FOOT SURGERY  06/2007   Left  . right carotid artery surgery  01/09/13   Dr. SDelana Meyer@ [facility]  . TOTAL HIP ARTHROPLASTY  05/03/2011   left 12, right 11/07    FAMILY HISTORY :   Family History  Problem  Relation Age of Onset  . Hyperlipidemia Father   . Heart disease Father        myocardial infarction  . Hypertension Father        Parent  . Arthritis Other        parent  . Diabetes Other         nephew  . Cervical cancer Sister   . Rectal cancer Sister   . Breast cancer Neg Hx     SOCIAL HISTORY:   Social History   Tobacco Use  . Smoking status: Former Smoker    Types: Cigarettes    Quit date: 11/19/1989    Years since quitting: 30.1  . Smokeless tobacco: Never Used  Substance Use Topics  . Alcohol use: No    Alcohol/week: 0.0 standard drinks  . Drug use: No    ALLERGIES:  is allergic to ciprofloxacin; levaquin [levofloxacin]; propofol; and tequin [gatifloxacin].  MEDICATIONS:  Current Outpatient Medications  Medication Sig Dispense Refill  . aspirin 81 MG tablet Take 81 mg by mouth daily.    . Biotin 5000 MCG CAPS Take 1 capsule by mouth daily.    . blood glucose meter kit and supplies KIT Dispense based on insurance preference. Use daily to check sugars once daily. Dx e11.9 1 each 0  . cetirizine (ZYRTEC) 10 MG tablet Take 10 mg by mouth daily.    Marland Kitchen glucose blood test strip Use as instructed to check blood sugars once daily. Dx e11.9 100 each 12  . isosorbide mononitrate (IMDUR) 30 MG 24 hr tablet Take 30 mg by mouth daily.     . Lancets (ONETOUCH ULTRASOFT) lancets Use as instructed to check blood sugars once daily. DX e 11.9 100 each 12  . Omega-3 Fatty Acids (FISH OIL) 1200 MG CAPS Take 1 capsule by mouth daily.    Marland Kitchen omeprazole (PRILOSEC) 40 MG capsule TAKE 1 TABLET 30 MINS BEFORE BREAKFAST AND 1 TABLET 30 MINS BEFORE DINNER.    Marland Kitchen rosuvastatin (CRESTOR) 40 MG tablet TAKE 1 TABLET DAILY 90 tablet 3  . sucralfate (CARAFATE) 1 g tablet Take 1 g by mouth 3 (three) times daily. TID    . traZODone (DESYREL) 50 MG tablet TAKE 0.5-1 TABLETS (25-50 MG TOTAL) BY MOUTH AT BEDTIME AS NEEDED FOR SLEEP. 90 tablet 1   No current facility-administered medications for this visit.    PHYSICAL EXAMINATION:   BP (!) 140/91 (BP Location: Left Arm, Patient Position: Sitting)   Pulse 72   Temp (!) 95.9 F (35.5 C) (Tympanic)   Resp 20   Ht _0  (1.499 m)   Wt 140 lb  (63.5 kg)   BMI 28.28 kg/m   Filed Weights   01/20/20 1032  Weight: 140 lb (63.5 kg)    Physical Exam  Constitutional: She is oriented to person, place, and time and well-developed, well-nourished, and in no distress.  Frail-appearing Caucasian female patient.  She is in a wheelchair.  HENT:  Head: Normocephalic and atraumatic.  Mouth/Throat: Oropharynx is clear and moist. No oropharyngeal exudate.  Eyes: Pupils are equal, round, and reactive to light.  Positive for pallor.  Cardiovascular: Normal rate and regular rhythm.  Pulmonary/Chest: No respiratory distress. She has no wheezes.  Decreased air entry at the bases.  Abdominal: Soft. Bowel sounds are normal. She exhibits no distension and no mass. There is no abdominal tenderness. There is no rebound and no guarding.  Musculoskeletal:        General: No tenderness or edema.  Normal range of motion.     Cervical back: Normal range of motion and neck supple.  Neurological: She is alert and oriented to person, place, and time.  Skin: Skin is warm.  Psychiatric: Affect normal.    LABORATORY DATA:  I have reviewed the data as listed    Component Value Date/Time   NA 138 01/20/2020 1003   NA 143 01/10/2013 0314   K 4.5 01/20/2020 1003   K 4.0 01/10/2013 0314   CL 105 01/20/2020 1003   CL 109 (H) 01/10/2013 0314   CO2 23 01/20/2020 1003   CO2 27 01/10/2013 0314   GLUCOSE 160 (H) 01/20/2020 1003   GLUCOSE 119 (H) 01/10/2013 0314   BUN 27 (H) 01/20/2020 1003   BUN 15 01/10/2013 0314   CREATININE 1.27 (H) 01/20/2020 1003   CREATININE 1.03 01/10/2013 0314   CREATININE 1.13 (H) 09/18/2012 0848   CALCIUM 9.3 01/20/2020 1003   CALCIUM 8.1 (L) 01/10/2013 0314   PROT 6.8 07/28/2019 1056   PROT 7.2 12/20/2011 1521   ALBUMIN 3.9 07/28/2019 1056   ALBUMIN 3.9 12/20/2011 1521   AST 14 07/28/2019 1056   AST 31 12/20/2011 1521   ALT 11 07/28/2019 1056   ALT 37 12/20/2011 1521   ALKPHOS 78 07/28/2019 1056   ALKPHOS 57 12/20/2011  1521   BILITOT 0.5 07/28/2019 1056   BILITOT 0.5 12/20/2011 1521   GFRNONAA 38 (L) 01/20/2020 1003   GFRNONAA 52 (L) 01/10/2013 0314   GFRNONAA 47 (L) 09/18/2012 0848   GFRAA 45 (L) 01/20/2020 1003   GFRAA >60 01/10/2013 0314   GFRAA 54 (L) 09/18/2012 0848    No results found for: SPEP, UPEP  Lab Results  Component Value Date   WBC 5.9 01/20/2020   NEUTROABS 3.6 01/20/2020   HGB 12.6 01/20/2020   HCT 39.6 01/20/2020   MCV 94.5 01/20/2020   PLT 164 01/20/2020      Chemistry      Component Value Date/Time   NA 138 01/20/2020 1003   NA 143 01/10/2013 0314   K 4.5 01/20/2020 1003   K 4.0 01/10/2013 0314   CL 105 01/20/2020 1003   CL 109 (H) 01/10/2013 0314   CO2 23 01/20/2020 1003   CO2 27 01/10/2013 0314   BUN 27 (H) 01/20/2020 1003   BUN 15 01/10/2013 0314   CREATININE 1.27 (H) 01/20/2020 1003   CREATININE 1.03 01/10/2013 0314   CREATININE 1.13 (H) 09/18/2012 0848      Component Value Date/Time   CALCIUM 9.3 01/20/2020 1003   CALCIUM 8.1 (L) 01/10/2013 0314   ALKPHOS 78 07/28/2019 1056   ALKPHOS 57 12/20/2011 1521   AST 14 07/28/2019 1056   AST 31 12/20/2011 1521   ALT 11 07/28/2019 1056   ALT 37 12/20/2011 1521   BILITOT 0.5 07/28/2019 1056   BILITOT 0.5 12/20/2011 1521         ASSESSMENT & PLAN:   Anemia of chronic kidney failure, stage 3 (moderate) (HCC) #Severe anemia hemoglobin-nadir 6 [summer 2020]. Likely CKD [ Bone marrow biopsy-mild dyserythropoietic changes].   #Hemoglobin today is  12.6;  hold Retacrit/IV iron.  Recommend PO iron/Vitron-C.  # CKD stage III-1.31- stable  # I LVM for patient's daughter, Shirlean Mylar- regarding the patient's clinical status/plan of care.  Family agreement.   # DISPOSITION:  # No retacrit today. # in 4 weeks- H&H- possible Retacrit # in  8weeks- MD- labs- cbc/bmp;possible Retacrit-/ Dr.B  Addendum: Iron studies: Saturation 13; ferritin 13.  I would recommend monitoring the blood counts closely; if dropping I  would recommend IV iron.     Cammie Sickle, MD 01/20/2020 12:38 PM

## 2020-01-20 NOTE — Assessment & Plan Note (Addendum)
#  Severe anemia hemoglobin-nadir 6 [summer 2020]. Likely CKD [ Bone marrow biopsy-mild dyserythropoietic changes].   #Hemoglobin today is  12.6;  hold Retacrit/IV iron.  Recommend PO iron/Vitron-C.  # CKD stage III-1.31- stable  # I LVM for patient's daughter, Shirlean Mylar- regarding the patient's clinical status/plan of care.  Family agreement.   # DISPOSITION:  # No retacrit today. # in 4 weeks- H&H- possible Retacrit # in  8weeks- MD- labs- cbc/bmp;possible Retacrit-/ Dr.B  Addendum: Iron studies: Saturation 13; ferritin 13.  I would recommend monitoring the blood counts closely; if dropping I would recommend IV iron.

## 2020-01-21 ENCOUNTER — Ambulatory Visit: Payer: Medicare Other | Admitting: Internal Medicine

## 2020-01-21 ENCOUNTER — Other Ambulatory Visit: Payer: Medicare Other

## 2020-01-21 ENCOUNTER — Ambulatory Visit: Payer: Medicare Other

## 2020-02-04 ENCOUNTER — Other Ambulatory Visit: Payer: Self-pay

## 2020-02-04 ENCOUNTER — Ambulatory Visit (INDEPENDENT_AMBULATORY_CARE_PROVIDER_SITE_OTHER): Payer: Medicare Other

## 2020-02-04 VITALS — BP 103/58 | HR 73 | Ht 59.0 in | Wt 140.0 lb

## 2020-02-04 DIAGNOSIS — Z0000 Encounter for general adult medical examination without abnormal findings: Secondary | ICD-10-CM | POA: Diagnosis not present

## 2020-02-04 NOTE — Progress Notes (Addendum)
Subjective:   Jocelyn Elliott is a 86 y.o. female who presents for Medicare Annual (Subsequent) preventive examination.  Review of Systems:  No ROS.  Medicare Wellness Virtual Visit.  Visual/audio telehealth visit. Vital sign provided by patient.  See social history for additional risk factors.   Cardiac Risk Factors include: advanced age (>32mn, >>72women);diabetes mellitus;hypertension     Objective:     Vitals: BP (!) 103/58 (BP Location: Right Wrist, Cuff Size: Normal)   Pulse 73   Ht 4' 11" (1.499 m)   Wt 140 lb (63.5 kg)   BMI 28.28 kg/m   Body mass index is 28.28 kg/m.  Advanced Directives 02/04/2020 01/20/2020 09/16/2019 08/18/2019 07/20/2019 06/22/2019 06/15/2019  Does Patient Have a Medical Advance Directive? Yes Yes - No Yes Yes -  Type of Advance Directive - [place] Living will [place] will Living will;Healthcare Power of [place] Living will  Does patient want to make changes to medical advance directive? No - Patient declined No - Patient declined - - No - Patient declined - -  Copy of [place] Chart? No - copy requested No - copy requested - No - copy requested No - copy requested - -  Would patient like information on creating a medical advance directive? - - - No - Patient declined - - -    Tobacco Social History   Tobacco Use  Smoking Status Former Smoker  . Types: Cigarettes  . Quit date: 11/19/1989  . Years since quitting: 30.2  Smokeless Tobacco Never Used     Counseling given: Not Answered   Clinical Intake:  Pre-visit preparation completed: Yes        Diabetes: Yes(Followed by pcp)  How often do you need to have someone help you when you read instructions, pamphlets, or other written materials from your doctor or pharmacy?: 1 - Never  Interpreter Needed?: No     Past Medical History:  Diagnosis Date  . Arthritis    . AV malformation of gastrointestinal tract   . Blood in stool   . CAD (coronary artery disease)   . Carotid arterial disease ([place]   . Cataracts, bilateral   . Chronic blood loss anemia   . Chronic cystitis   . Complication of anesthesia    reaction to propofol  . Depression   . Diabetes mellitus ([place]   . GERD (gastroesophageal reflux disease)   . Hyperlipidemia   . Hypertension   . IDA (iron deficiency anemia)   . Stress incontinence    Past Surgical History:  Procedure Laterality Date  . ABDOMINAL HYSTERECTOMY  1972   ovaries left in place  . APPENDECTOMY  1992  . BACK SURGERY  06/08/2010   L3, L4, L5  . CAROTID ARTERY ANGIOPLASTY  11/03  . [place] 89   right then left   . CHOLECYSTECTOMY  1993  . COLONOSCOPY WITH PROPOFOL N/A 02/02/2019   Procedure: COLONOSCOPY WITH PROPOFOL;  Surgeon: EManya Silvas MD;  [facility]  Service: Endoscopy;  Laterality: N/A;  . ESOPHAGOGASTRODUODENOSCOPY N/A 02/02/2019   Procedure: ESOPHAGOGASTRODUODENOSCOPY (EGD);  Surgeon: EManya Silvas MD;  [facility];  Service: Endoscopy;  Laterality: N/A;  . FOOT SURGERY  06/2007   Left  . right carotid artery surgery  01/09/13   Dr. SDelana Meyer@ [facility]  . TOTAL HIP ARTHROPLASTY  05/03/2011   left 12, right 11/07  Family History  Problem Relation Age of Onset  . Hyperlipidemia Father   . Heart disease Father        myocardial infarction  . Hypertension Father        Parent  . Arthritis Other        parent  . Diabetes Other        nephew  . Cervical cancer Sister   . Rectal cancer Sister   . Breast cancer Neg Hx    Social History   Socioeconomic History  . Marital status: Widowed    Spouse name: Not on file  . Number of children: 1  . Years of education: 64  . Highest education level: Not on file  Occupational History  . Occupation: Retired  Tobacco Use  . Smoking status: Former Smoker    Types: Cigarettes    Quit date:  11/19/1989    Years since quitting: 30.2  . Smokeless tobacco: Never Used  Substance and Sexual Activity  . Alcohol use: No    Alcohol/week: 0.0 standard drinks  . Drug use: No  . Sexual activity: Not Currently  Other Topics Concern  . Not on file  Social History Narrative   Regular exercise-no   Caffeine Use-yes   Social Determinants of Health   Financial Resource Strain: Low Risk   . Difficulty of Paying Living Expenses: Not hard at all  Food Insecurity: No Food Insecurity  . Worried About Charity fundraiser in the Last Year: Never true  . Ran Out of Food in the Last Year: Never true  Transportation Needs: No Transportation Needs  . Lack of Transportation (Medical): No  . Lack of Transportation (Non-Medical): No  Physical Activity:   . Days of Exercise per Week:   . Minutes of Exercise per Session:   Stress: No Stress Concern Present  . Feeling of Stress : Not at all  Social Connections: Unknown  . Frequency of Communication with Friends and Family: More than three times a week  . Frequency of Social Gatherings with Friends and Family: Three times a week  . Attends Religious Services: Not on file  . Active Member of Clubs or Organizations: Not on file  . Attends Archivist Meetings: Not on file  . Marital Status: Widowed    Outpatient Encounter Medications as of 02/04/2020  Medication Sig  . aspirin 81 MG tablet Take 81 mg by mouth daily.  . Biotin 5000 MCG CAPS Take 1 capsule by mouth daily.  . blood glucose meter kit and supplies KIT Dispense based on insurance preference. Use daily to check sugars once daily. Dx e11.9  . cetirizine (ZYRTEC) 10 MG tablet Take 10 mg by mouth daily.  Marland Kitchen glucose blood test strip Use as instructed to check blood sugars once daily. Dx e11.9  . isosorbide mononitrate (IMDUR) 30 MG 24 hr tablet Take 30 mg by mouth daily.   . Lancets (ONETOUCH ULTRASOFT) lancets Use as instructed to check blood sugars once daily. DX e 11.9  .  metFORMIN (GLUCOPHAGE) 500 MG tablet Take 500 mg by mouth daily with breakfast.  . Omega-3 Fatty Acids (FISH OIL) 1200 MG CAPS Take 1 capsule by mouth daily.  . rosuvastatin (CRESTOR) 40 MG tablet TAKE 1 TABLET DAILY  . traZODone (DESYREL) 50 MG tablet TAKE 0.5-1 TABLETS (25-50 MG TOTAL) BY MOUTH AT BEDTIME AS NEEDED FOR SLEEP.  . [DISCONTINUED] omeprazole (PRILOSEC) 40 MG capsule TAKE 1 TABLET 30 MINS BEFORE BREAKFAST AND 1 TABLET 30 MINS  BEFORE DINNER.  . [DISCONTINUED] sucralfate (CARAFATE) 1 g tablet Take 1 g by mouth 3 (three) times daily. TID   No facility-administered encounter medications on file as of 02/04/2020.    Activities of Daily Living In your present state of health, do you have any difficulty performing the following activities: 02/04/2020  Hearing? N  Vision? N  Difficulty concentrating or making decisions? N  Walking or climbing stairs? Y  Dressing or bathing? N  Doing errands, shopping? Y  Comment She does not Physiological scientist and eating ? N  Using the Toilet? N  In the past six months, have you accidently leaked urine? N  Do you have problems with loss of bowel control? N  Managing your Medications? N  Managing your Finances? N  Housekeeping or managing your Housekeeping? N  Some recent data might be hidden    Patient Care Team: Einar Pheasant, MD as PCP - General (Internal Medicine) Einar Pheasant, MD (Internal Medicine) Isaias Cowman, MD (Internal Medicine) Albesa Seen, MD (Unknown Physician Specialty) Brendolyn Patty, MD (Specialist) Schnier, Dolores Lory, MD (Vascular Surgery) Leia Alf, MD (Inactive) as Referring Physician (Internal Medicine) Isaias Cowman, MD (Internal Medicine) Albesa Seen, MD (Unknown Physician Specialty) Philis Kendall, MD (Unknown Physician Specialty) Brendolyn Patty, MD (Specialist) Delana Meyer, Dolores Lory, MD (Vascular Surgery)    Assessment:   This is a routine wellness examination for Seymour.  Nurse  connected with patient 02/04/20 at 11:30 AM EDT by a telephone enabled telemedicine application and verified that I am speaking with the correct person using two identifiers. Patient stated full name and DOB. Patient gave permission to continue with virtual visit. Patient's location was at home and Nurse's location was at [place] office.   Patient is alert and oriented x3. Patient denies difficulty focusing or concentrating. Patient likes to read, play the piano, stay busy keeping her flowers and rocks on the porch for brain health.   Health Maintenance Due: -Tdap vaccine- discussed; to be completed with doctor in visit or local pharmacy.   -Urine Microalbumin- followed by pcp -Eye Exam- plans to schedule  -Foot Exam- denies wounds, numbness, tingling or any changes. Followed by pcp. -Hgb A1c- 07/28/19 (6.9) See completed HM at the end of note.   Labs completed at cancer center once a month.   FBS today 123  Eye: Visual acuity not assessed. Virtual visit. Followed by their ophthalmologist.  Retinopathy- none reported.  Dental: UTD   Hearing: Demonstrates normal hearing during visit.  Safety:  Patient feels safe at home- yes Patient does have smoke detectors at home- yes Patient does wear sunscreen or protective clothing when in direct sunlight - yes Patient does wear seat belt when in a moving vehicle - yes Patient drives- no Adequate lighting in walkways free from debris- yes Grab bars and handrails used as appropriate- yes Ambulates with an assistive device- yes; cane as needed Cell phone on person when ambulating outside of the home-yes  Social: Alcohol intake - no    Smoking history- former  Smokers in home? none Illicit drug use? none  Medication: Taking as directed and without issues.  Metformin 570m -started taking again November 2020. Next refill through [facility] [phone]. Pill box in use -yes  Self managed - yes   Covid-19: Precautions and  sickness symptoms discussed. Wears mask, social distancing, hand hygiene as appropriate.   Activities of Daily Living Patient denies needing assistance with: household chores, feeding themselves, getting from bed to chair, getting to the toilet,  bathing/showering, dressing, managing money, or preparing meals.   Discussed the importance of a healthy diet, water intake and the benefits of aerobic exercise.   Physical activity- chair exercises, leg exercises. Walking inside the home.   Diet:  Modified Water: good intake Caffeine: 1 pepsi  Other Providers Patient Care Team: Einar Pheasant, MD as PCP - General (Internal Medicine) Einar Pheasant, MD (Internal Medicine) Isaias Cowman, MD (Internal Medicine) Albesa Seen, MD (Unknown Physician Specialty) Brendolyn Patty, MD (Specialist) Schnier, Dolores Lory, MD (Vascular Surgery) Leia Alf, MD (Inactive) as Referring Physician (Internal Medicine) Isaias Cowman, MD (Internal Medicine) Albesa Seen, MD (Unknown Physician Specialty) Philis Kendall, MD (Unknown Physician Specialty) Brendolyn Patty, MD (Specialist) Delana Meyer, Dolores Lory, MD (Vascular Surgery)  Exercise Activities and Dietary recommendations Current Exercise Habits: Home exercise routine, Intensity: Mild  Goals    . Follow up with Primary Care Provider     As needed       Fall Risk Fall Risk  02/04/2020 08/05/2019 10/06/2018 11/08/2016 09/20/2015  Falls in the past year? 0 0 0 No No  Risk for fall due to : Impaired balance/gait - - - -  Follow up Falls prevention discussed - - - -    Timed Get Up and Go performed: no, virtual visit  Depression Screen PHQ 2/9 Scores 02/04/2020 11/08/2016 09/20/2015 06/08/2014  PHQ - 2 Score 0 0 0 0     Cognitive Function     6CIT Screen 02/04/2020 11/08/2016  What Year? 0 points 0 points  What month? 0 points 0 points  What time? 0 points 0 points  Count back from 20 0 points 0 points  Months in reverse 0 points  0 points    Immunization History  Administered Date(s) Administered  . Fluad Quad(high Dose 65+) 07/28/2019  . Influenza, High Dose Seasonal PF 11/08/2016, 09/26/2017, 10/14/2018  . Influenza,inj,Quad PF,6+ Mos 10/07/2013, 07/18/2015  . PFIZER SARS-COV-2 Vaccination 01/15/2020  . Pneumococcal Conjugate-13 09/26/2017  . Pneumococcal Polysaccharide-23 09/24/2007   Screening Tests Health Maintenance  Topic Date Due  . URINE MICROALBUMIN  10/14/2019  . FOOT EXAM  10/15/2019  . OPHTHALMOLOGY EXAM  10/23/2019  . HEMOGLOBIN A1C  01/25/2020  . DEXA SCAN  07/27/2020 (Originally 06/13/1999)  . TETANUS/TDAP  02/03/2021 (Originally 06/12/1951)  . MAMMOGRAM  09/15/2020  . INFLUENZA VACCINE  Completed  . PNA vac Low Risk Adult  Completed      Plan:   Keep all routine maintenance appointments.   Follow up scheduled 02/25/20 at 3:00.  Medicare Attestation I have personally reviewed: The patient's medical and social history Their use of alcohol, tobacco or illicit drugs Their current medications and supplements The patient's functional ability including ADLs,fall risks, home safety risks, cognitive, and hearing and visual impairment Diet and physical activities Evidence for depression   I have reviewed and discussed with patient certain preventive protocols, quality metrics, and best practice recommendations.      Varney Biles, LPN  6/60/6301   Reviewed above information.  Agree with assessment and plan.  Dr Nicki Reaper

## 2020-02-04 NOTE — Patient Instructions (Addendum)
  Jocelyn Elliott , Thank you for taking time to come for your Medicare Wellness Visit. I appreciate your ongoing commitment to your health goals. Please review the following plan we discussed and let me know if I can assist you in the future.   These are the goals we discussed: Goals    . Follow up with Primary Care Provider     As needed       This is a list of the screening recommended for you and due dates:  Health Maintenance  Topic Date Due  . Urine Protein Check  10/14/2019  . Complete foot exam   10/15/2019  . Eye exam for diabetics  10/23/2019  . Hemoglobin A1C  01/25/2020  . DEXA scan (bone density measurement)  07/27/2020*  . Tetanus Vaccine  02/03/2021*  . Mammogram  09/15/2020  . Flu Shot  Completed  . Pneumonia vaccines  Completed  *Topic was postponed. The date shown is not the original due date.

## 2020-02-10 ENCOUNTER — Ambulatory Visit: Payer: Medicare Other | Attending: Internal Medicine

## 2020-02-10 DIAGNOSIS — Z23 Encounter for immunization: Secondary | ICD-10-CM

## 2020-02-10 NOTE — Progress Notes (Signed)
   Covid-19 Vaccination Clinic  Name:  Jocelyn Elliott    MRN: [id number] DOB: 1932/04/16  02/10/2020  [name] was observed post Covid-19 immunization for 15 minutes without incident. She was provided with Vaccine Information Sheet and instruction to access the V-Safe system.   [name] was instructed to call 911 with any severe reactions post vaccine: Marland Kitchen Difficulty breathing  . Swelling of face and throat  . A fast heartbeat  . A bad rash all over body  . Dizziness and weakness   Immunizations Administered    Name Date Dose VIS Date Route   Pfizer COVID-19 Vaccine 02/10/2020  2:22 PM 0.3 mL 10/30/2019 Intramuscular   Manufacturer: [place]   Lot: [id number]   [place]: [id number]

## 2020-02-17 ENCOUNTER — Inpatient Hospital Stay: Payer: Medicare Other

## 2020-02-17 ENCOUNTER — Other Ambulatory Visit: Payer: Self-pay

## 2020-02-17 DIAGNOSIS — D631 Anemia in chronic kidney disease: Secondary | ICD-10-CM

## 2020-02-17 DIAGNOSIS — N183 Chronic kidney disease, stage 3 unspecified: Secondary | ICD-10-CM | POA: Diagnosis not present

## 2020-02-17 LAB — HEMOGLOBIN: Hemoglobin: 12.5 g/dL (ref 12.0–15.0)

## 2020-02-17 LAB — HEMATOCRIT: HCT: 38.4 % (ref 36.0–46.0)

## 2020-02-25 ENCOUNTER — Other Ambulatory Visit: Payer: Self-pay

## 2020-02-25 ENCOUNTER — Ambulatory Visit: Payer: Medicare Other | Admitting: Internal Medicine

## 2020-02-25 VITALS — BP 118/68 | HR 73 | Temp 98.1°F | Resp 16 | Ht 59.0 in | Wt 138.8 lb

## 2020-02-25 DIAGNOSIS — E7800 Pure hypercholesterolemia, unspecified: Secondary | ICD-10-CM

## 2020-02-25 DIAGNOSIS — I4891 Unspecified atrial fibrillation: Secondary | ICD-10-CM

## 2020-02-25 DIAGNOSIS — N183 Chronic kidney disease, stage 3 unspecified: Secondary | ICD-10-CM

## 2020-02-25 DIAGNOSIS — M65341 Trigger finger, right ring finger: Secondary | ICD-10-CM

## 2020-02-25 DIAGNOSIS — I2510 Atherosclerotic heart disease of native coronary artery without angina pectoris: Secondary | ICD-10-CM

## 2020-02-25 DIAGNOSIS — R21 Rash and other nonspecific skin eruption: Secondary | ICD-10-CM

## 2020-02-25 DIAGNOSIS — E1159 Type 2 diabetes mellitus with other circulatory complications: Secondary | ICD-10-CM

## 2020-02-25 DIAGNOSIS — I10 Essential (primary) hypertension: Secondary | ICD-10-CM | POA: Diagnosis not present

## 2020-02-25 DIAGNOSIS — I779 Disorder of arteries and arterioles, unspecified: Secondary | ICD-10-CM

## 2020-02-25 DIAGNOSIS — D631 Anemia in chronic kidney disease: Secondary | ICD-10-CM

## 2020-02-25 LAB — HM DIABETES FOOT EXAM

## 2020-02-25 MED ORDER — METFORMIN HCL 500 MG PO TABS: 500.0000 mg | ORAL_TABLET | Freq: Every day | ORAL | 1 refills | Status: DC

## 2020-02-25 MED ORDER — ROSUVASTATIN CALCIUM 40 MG PO TABS: 40.0000 mg | ORAL_TABLET | Freq: Every day | ORAL | 3 refills | Status: DC

## 2020-02-25 MED ORDER — NYSTATIN 100000 UNIT/GM EX POWD: 1.0000 "application " | Freq: Two times a day (BID) | CUTANEOUS | 0 refills | Status: DC

## 2020-02-25 NOTE — Progress Notes (Signed)
Patient ID: Jocelyn Elliott, female   DOB: Apr 24, 1932, 86 y.o.   MRN: [id number]   Subjective:    Patient ID: Jocelyn Elliott, female    DOB: 1932/01/15, 86 y.o.   MRN: [id number]  HPI This visit occurred during the SARS-CoV-2 public health emergency.  Safety protocols were in place, including screening questions prior to the visit, additional usage of staff PPE, and extensive cleaning of exam room while observing appropriate contact time as indicated for disinfecting solutions.  Patient here for a scheduled follow up.  She reports she is doing relatively well.  No chest pain.  Breathing stable.  No acid reflux reported.  No abdominal pain.  Bowels stable.  No blood reported. Seeing Dr Stanford Breed for f/u of her anemia.  Last evaluated 01/20/20.  No retacrit needed at that visit.  He is following cbc and iron.  Some irritation/rash.  Nystatin cream not helping.  Request powder.  Also, persistent problems with her right fourth finger - trigger finger.  Has tried conservative measures.  Persistent problem.  Discussed referral to ortho.  Saw Dr Saralyn Pilar 10/2019.  Stable.  Recommended f/u in 4 months.  No increased heart rate or palpitations.     Past Medical History:  Diagnosis Date  . Arthritis   . AV malformation of gastrointestinal tract   . Blood in stool   . CAD (coronary artery disease)   . Carotid arterial disease ([place])   . Cataracts, bilateral   . Chronic blood loss anemia   . Chronic cystitis   . Complication of anesthesia    reaction to propofol  . Depression   . Diabetes mellitus ([place])   . GERD (gastroesophageal reflux disease)   . Hyperlipidemia   . Hypertension   . IDA (iron deficiency anemia)   . Stress incontinence    Past Surgical History:  Procedure Laterality Date  . ABDOMINAL HYSTERECTOMY  1972   ovaries left in place  . APPENDECTOMY  1992  . BACK SURGERY  06/08/2010   L3, L4, L5  . CAROTID ARTERY ANGIOPLASTY  11/03  . [place], 89   right then  left   . CHOLECYSTECTOMY  1993  . COLONOSCOPY WITH PROPOFOL N/A 02/02/2019   Procedure: COLONOSCOPY WITH PROPOFOL;  Surgeon: Manya Silvas, MD;  [facility] ENDOSCOPY;  Service: Endoscopy;  Laterality: N/A;  . ESOPHAGOGASTRODUODENOSCOPY N/A 02/02/2019   Procedure: ESOPHAGOGASTRODUODENOSCOPY (EGD);  Surgeon: Manya Silvas, MD;  Location: [facility] ENDOSCOPY;  Service: Endoscopy;  Laterality: N/A;  . FOOT SURGERY  06/2007   Left  . right carotid artery surgery  01/09/13   Dr. Delana Meyer @ [facility]  . TOTAL HIP ARTHROPLASTY  05/03/2011   left 12, right 11/07   Family History  Problem Relation Age of Onset  . Hyperlipidemia Father   . Heart disease Father        myocardial infarction  . Hypertension Father        Parent  . Arthritis Other        parent  . Diabetes Other        nephew  . Cervical cancer Sister   . Rectal cancer Sister   . Breast cancer Neg Hx    Social History   Socioeconomic History  . Marital status: Widowed    Spouse name: Not on file  . Number of children: 1  . Years of education: 61  . Highest education level: Not on file  Occupational History  . Occupation: Retired  Tobacco Use  . Smoking status: Former Smoker    Types: Cigarettes    Quit date: 11/19/1989    Years since quitting: 30.3  . Smokeless tobacco: Never Used  Substance and Sexual Activity  . Alcohol use: No    Alcohol/week: 0.0 standard drinks  . Drug use: No  . Sexual activity: Not Currently  Other Topics Concern  . Not on file  Social History Narrative   Regular exercise-no   Caffeine Use-yes   Social Determinants of Health   Financial Resource Strain: Low Risk   . Difficulty of Paying Living Expenses: Not hard at all  Food Insecurity: No Food Insecurity  . Worried About Charity fundraiser in the Last Year: Never true  . Ran Out of Food in the Last Year: Never true  Transportation Needs: No Transportation Needs  . Lack of Transportation (Medical): No  . Lack of Transportation  (Non-Medical): No  Physical Activity:   . Days of Exercise per Week:   . Minutes of Exercise per Session:   Stress: No Stress Concern Present  . Feeling of Stress : Not at all  Social Connections: Unknown  . Frequency of Communication with Friends and Family: More than three times a week  . Frequency of Social Gatherings with Friends and Family: Three times a week  . Attends Religious Services: Not on file  . Active Member of Clubs or Organizations: Not on file  . Attends Archivist Meetings: Not on file  . Marital Status: Widowed   Outpatient Encounter Medications as of 02/25/2020  Medication Sig  . aspirin 81 MG tablet Take 81 mg by mouth daily.  . Biotin 5000 MCG CAPS Take 1 capsule by mouth daily.  . blood glucose meter kit and supplies KIT Dispense based on insurance preference. Use daily to check sugars once daily. Dx e11.9  . cetirizine (ZYRTEC) 10 MG tablet Take 10 mg by mouth daily.  Marland Kitchen glucose blood test strip Use as instructed to check blood sugars once daily. Dx e11.9  . isosorbide mononitrate (IMDUR) 30 MG 24 hr tablet Take 30 mg by mouth daily.   . Lancets (ONETOUCH ULTRASOFT) lancets Use as instructed to check blood sugars once daily. DX e 11.9  . metFORMIN (GLUCOPHAGE) 500 MG tablet Take 1 tablet (500 mg total) by mouth daily with breakfast.  . nystatin (NYSTATIN) powder Apply 1 application topically in the morning and at bedtime.  . Omega-3 Fatty Acids (FISH OIL) 1200 MG CAPS Take 1 capsule by mouth daily.  . traZODone (DESYREL) 50 MG tablet TAKE 0.5-1 TABLETS (25-50 MG TOTAL) BY MOUTH AT BEDTIME AS NEEDED FOR SLEEP.  . [DISCONTINUED] metFORMIN (GLUCOPHAGE) 500 MG tablet Take 500 mg by mouth daily with breakfast.  . [DISCONTINUED] rosuvastatin (CRESTOR) 40 MG tablet TAKE 1 TABLET DAILY  . [DISCONTINUED] rosuvastatin (CRESTOR) 40 MG tablet Take 1 tablet (40 mg total) by mouth daily.   No facility-administered encounter medications on file as of 02/25/2020.     Review of Systems  Constitutional: Negative for appetite change and unexpected weight change.  HENT: Negative for congestion and sinus pressure.   Respiratory: Negative for cough, chest tightness and shortness of breath.   Cardiovascular: Negative for chest pain, palpitations and leg swelling.  Gastrointestinal: Negative for abdominal pain, diarrhea and nausea.  Genitourinary: Negative for difficulty urinating and dysuria.  Musculoskeletal: Negative for joint swelling and myalgias.  Skin:       Rash reported.   Neurological: Negative for dizziness, light-headedness  and headaches.  Psychiatric/Behavioral: Negative for agitation and dysphoric mood.       Objective:    Physical Exam Constitutional:      General: She is not in acute distress.    Appearance: Normal appearance.  HENT:     Head: Normocephalic and atraumatic.     Right Ear: External ear normal.     Left Ear: External ear normal.  Eyes:     General:        Right eye: No discharge.        Left eye: No discharge.     Conjunctiva/sclera: Conjunctivae normal.  Neck:     Thyroid: No thyromegaly.  Cardiovascular:     Rate and Rhythm: Normal rate and regular rhythm.  Pulmonary:     Effort: No respiratory distress.     Breath sounds: Normal breath sounds. No wheezing.  Abdominal:     General: Bowel sounds are normal.     Palpations: Abdomen is soft.     Tenderness: There is no abdominal tenderness.  Musculoskeletal:        General: No swelling or tenderness.     Cervical back: Neck supple. No tenderness.  Lymphadenopathy:     Cervical: No cervical adenopathy.  Skin:    Findings: No erythema or rash.  Neurological:     Mental Status: She is alert.  Psychiatric:        Mood and Affect: Mood normal.        Behavior: Behavior normal.     BP 118/68   Pulse 73   Temp 98.1 F (36.7 C)   Resp 16   Ht _0  (1.499 m)   Wt 138 lb 12.8 oz (63 kg)   SpO2 97%   BMI 28.03 kg/m  Wt Readings from Last 3  Encounters:  02/25/20 138 lb 12.8 oz (63 kg)  02/04/20 140 lb (63.5 kg)  01/20/20 140 lb (63.5 kg)     Lab Results  Component Value Date   WBC 5.9 01/20/2020   HGB 12.5 02/17/2020   HCT 38.4 02/17/2020   PLT 164 01/20/2020   GLUCOSE 120 (H) 02/25/2020   CHOL 132 02/25/2020   TRIG 146.0 02/25/2020   HDL 49.10 02/25/2020   LDLDIRECT 51.0 04/14/2019   LDLCALC 53 02/25/2020   ALT 53 (H) 02/25/2020   AST 48 (H) 02/25/2020   NA 139 02/25/2020   K 4.1 02/25/2020   CL 106 02/25/2020   CREATININE 1.24 (H) 02/25/2020   BUN 26 (H) 02/25/2020   CO2 25 02/25/2020   TSH 1.40 02/25/2020   INR 1.0 06/15/2019   HGBA1C 6.9 (H) 02/25/2020   MICROALBUR 32.8 (H) 02/25/2020    MM 3D SCREEN BREAST BILATERAL  Result Date: 09/16/2019 CLINICAL DATA:  Screening. EXAM: DIGITAL SCREENING BILATERAL MAMMOGRAM WITH TOMO AND CAD COMPARISON:  None. ACR Breast Density Category b: There are scattered areas of fibroglandular density. FINDINGS: There are no findings suspicious for malignancy. Images were processed with CAD. IMPRESSION: No mammographic evidence of malignancy. A result letter of this screening mammogram will be mailed directly to the patient. RECOMMENDATION: Screening mammogram in one year. (Code:SM-B-01Y) BI-RADS CATEGORY  1: Negative. Electronically Signed   By: Dorise Bullion III M.D   On: 09/16/2019 17:22       Assessment & Plan:   Problem List Items Addressed This Visit    Afib ([place])    Followed by cardiology.  Stable.  Reports no increased heart rate or palpitations.  Anemia of chronic kidney failure, stage 3 (moderate)    Being followed by Dr Rogue Bussing.  Last hgb 12.6.  Held on IV iron.  They are following cbc.        CAD (coronary artery disease)    Saw Dr Saralyn Pilar 10/2019.  Stable.  Continue risk factor modification. Continue crestor.        Carotid arterial disease ([place])    Followed by [facility].  Continue crestor and aspirin.        CKD (chronic kidney disease)  stage 3, GFR 30-59 ml/min    Avoid antiinflammatories.  Stay hydrated.  Follow metabolic panel.       Diabetes mellitus ([place]) - Primary    Outside sugar readings reviewed.  On metformin.  Follow met b and a1c.  Low carb diet and exercise.   Lab Results  Component Value Date   HGBA1C 6.9 (H) 02/25/2020        Relevant Medications   metFORMIN (GLUCOPHAGE) 500 MG tablet   Other Relevant Orders   Hemoglobin A1c (Completed)   Microalbumin / creatinine urine ratio (Completed)   Hypercholesteremia    On crestor.  Low cholesterol diet and exercise.  Follow lipid panel and liver function tests.        Relevant Orders   Hepatic function panel (Completed)   Lipid panel (Completed)   Hypertension    Blood pressure as outlined.  Taking imdur.  Follow pressures.  Follow metabolic panel.       Relevant Orders   Basic metabolic panel (Completed)   TSH (Completed)   Rash    Nystatin powder as directed.  Follow.       Trigger finger, right ring finger    Persistent problem for her.  Refer to ortho for further evaluation and treatment.        Relevant Orders   Ambulatory referral to Orthopedic Surgery       Einar Pheasant, MD

## 2020-02-26 LAB — LIPID PANEL
Cholesterol: 132 mg/dL (ref 0–200)
HDL: 49.1 mg/dL (ref >39.00)
LDL Cholesterol: 53 mg/dL (ref 0–99)
NonHDL: 82.58
Total CHOL/HDL Ratio: 3
Triglycerides: 146 mg/dL (ref 0.0–149.0)
VLDL: 29.2 mg/dL (ref 0.0–40.0)

## 2020-02-26 LAB — HEPATIC FUNCTION PANEL
ALT: 53 U/L — ABNORMAL HIGH (ref 0–35)
AST: 48 U/L — ABNORMAL HIGH (ref 0–37)
Albumin: 4.1 g/dL (ref 3.5–5.2)
Alkaline Phosphatase: 55 U/L (ref 39–117)
Bilirubin, Direct: 0.1 mg/dL (ref 0.0–0.3)
Total Bilirubin: 0.5 mg/dL (ref 0.2–1.2)
Total Protein: 6.8 g/dL (ref 6.0–8.3)

## 2020-02-26 LAB — BASIC METABOLIC PANEL
BUN: 26 mg/dL — ABNORMAL HIGH (ref 6–23)
CO2: 25 mEq/L (ref 19–32)
Calcium: 9.4 mg/dL (ref 8.4–10.5)
Chloride: 106 mEq/L (ref 96–112)
Creatinine, Ser: 1.24 mg/dL — ABNORMAL HIGH (ref 0.40–1.20)
GFR: 41.04 mL/min — ABNORMAL LOW (ref >60.00)
Glucose, Bld: 120 mg/dL — ABNORMAL HIGH (ref 70–99)
Potassium: 4.1 mEq/L (ref 3.5–5.1)
Sodium: 139 mEq/L (ref 135–145)

## 2020-02-26 LAB — TSH: TSH: 1.4 u[IU]/mL (ref 0.35–4.50)

## 2020-02-26 LAB — MICROALBUMIN / CREATININE URINE RATIO
Creatinine,U: 61.7 mg/dL
Microalb Creat Ratio: 53.2 mg/g — ABNORMAL HIGH (ref 0.0–30.0)
Microalb, Ur: 32.8 mg/dL — ABNORMAL HIGH (ref 0.0–1.9)

## 2020-02-26 LAB — HEMOGLOBIN A1C: Hgb A1c MFr Bld: 6.9 % — ABNORMAL HIGH (ref 4.6–6.5)

## 2020-02-29 ENCOUNTER — Telehealth: Payer: Self-pay | Admitting: Internal Medicine

## 2020-02-29 ENCOUNTER — Other Ambulatory Visit: Payer: Self-pay | Admitting: Internal Medicine

## 2020-02-29 DIAGNOSIS — R7989 Other specified abnormal findings of blood chemistry: Secondary | ICD-10-CM

## 2020-02-29 DIAGNOSIS — R945 Abnormal results of liver function studies: Secondary | ICD-10-CM

## 2020-02-29 MED ORDER — ROSUVASTATIN CALCIUM 40 MG PO TABS: 40.0000 mg | ORAL_TABLET | Freq: Every day | ORAL | 0 refills | Status: DC

## 2020-02-29 NOTE — Addendum Note (Signed)
Addended byElpidio Galea T on: 02/29/2020 12:38 PM   Modules accepted: Orders

## 2020-02-29 NOTE — Telephone Encounter (Signed)
Pt needs a refill on rosuvastatin (CRESTOR) 40 MG tablet sent to [facility] order has not arrived

## 2020-02-29 NOTE — Progress Notes (Signed)
Order placed for f/u labs.  

## 2020-03-02 ENCOUNTER — Other Ambulatory Visit: Payer: Self-pay | Admitting: *Deleted

## 2020-03-02 DIAGNOSIS — R945 Abnormal results of liver function studies: Secondary | ICD-10-CM

## 2020-03-02 DIAGNOSIS — D631 Anemia in chronic kidney disease: Secondary | ICD-10-CM

## 2020-03-05 ENCOUNTER — Encounter: Payer: Self-pay | Admitting: Internal Medicine

## 2020-03-05 DIAGNOSIS — M65341 Trigger finger, right ring finger: Secondary | ICD-10-CM | POA: Insufficient documentation

## 2020-03-05 NOTE — Assessment & Plan Note (Signed)
Avoid antiinflammatories.  Stay hydrated.  Follow metabolic panel.   

## 2020-03-05 NOTE — Assessment & Plan Note (Signed)
Outside sugar readings reviewed.  On metformin.  Follow met b and a1c.  Low carb diet and exercise.   Lab Results  Component Value Date   HGBA1C 6.9 (H) 02/25/2020

## 2020-03-05 NOTE — Assessment & Plan Note (Signed)
Followed by cardiology.  Stable.  Reports no increased heart rate or palpitations.

## 2020-03-05 NOTE — Assessment & Plan Note (Signed)
Nystatin powder as directed.  Follow.

## 2020-03-05 NOTE — Assessment & Plan Note (Signed)
Persistent problem for her.  Refer to ortho for further evaluation and treatment.

## 2020-03-05 NOTE — Assessment & Plan Note (Signed)
Being followed by Dr Rogue Bussing.  Last hgb 12.6.  Held on IV iron.  They are following cbc.

## 2020-03-05 NOTE — Assessment & Plan Note (Signed)
Saw Dr Saralyn Pilar 10/2019.  Stable.  Continue risk factor modification. Continue crestor.

## 2020-03-05 NOTE — Assessment & Plan Note (Signed)
Followed by [facility].  Continue crestor and aspirin.

## 2020-03-05 NOTE — Assessment & Plan Note (Signed)
On crestor.  Low cholesterol diet and exercise.  Follow lipid panel and liver function tests.   

## 2020-03-05 NOTE — Assessment & Plan Note (Signed)
Blood pressure as outlined.  Taking imdur.  Follow pressures.  Follow metabolic panel.

## 2020-03-16 ENCOUNTER — Inpatient Hospital Stay: Payer: Medicare Other

## 2020-03-16 ENCOUNTER — Inpatient Hospital Stay (HOSPITAL_BASED_OUTPATIENT_CLINIC_OR_DEPARTMENT_OTHER): Payer: Medicare Other | Admitting: Internal Medicine

## 2020-03-16 ENCOUNTER — Encounter: Payer: Self-pay | Admitting: Internal Medicine

## 2020-03-16 ENCOUNTER — Other Ambulatory Visit: Payer: Self-pay

## 2020-03-16 ENCOUNTER — Inpatient Hospital Stay: Payer: Medicare Other | Attending: Internal Medicine

## 2020-03-16 DIAGNOSIS — D631 Anemia in chronic kidney disease: Secondary | ICD-10-CM

## 2020-03-16 DIAGNOSIS — N183 Chronic kidney disease, stage 3 unspecified: Secondary | ICD-10-CM | POA: Diagnosis not present

## 2020-03-16 DIAGNOSIS — R945 Abnormal results of liver function studies: Secondary | ICD-10-CM

## 2020-03-16 LAB — CBC WITH DIFFERENTIAL/PLATELET
Abs Immature Granulocytes: 0.02 10*3/uL (ref 0.00–0.07)
Basophils Absolute: 0.1 10*3/uL (ref 0.0–0.1)
Basophils Relative: 1 %
Eosinophils Absolute: 0.2 10*3/uL (ref 0.0–0.5)
Eosinophils Relative: 3 %
HCT: 37.7 % (ref 36.0–46.0)
Hemoglobin: 12.4 g/dL (ref 12.0–15.0)
Immature Granulocytes: 0 %
Lymphocytes Relative: 25 %
Lymphs Abs: 1.8 10*3/uL (ref 0.7–4.0)
MCH: 30.5 pg (ref 26.0–34.0)
MCHC: 32.9 g/dL (ref 30.0–36.0)
MCV: 92.6 fL (ref 80.0–100.0)
Monocytes Absolute: 0.4 10*3/uL (ref 0.1–1.0)
Monocytes Relative: 6 %
Neutro Abs: 4.7 10*3/uL (ref 1.7–7.7)
Neutrophils Relative %: 65 %
Platelets: 172 10*3/uL (ref 150–400)
RBC: 4.07 MIL/uL (ref 3.87–5.11)
RDW: 13.8 % (ref 11.5–15.5)
WBC: 7.2 10*3/uL (ref 4.0–10.5)
nRBC: 0 % (ref 0.0–0.2)

## 2020-03-16 LAB — COMPREHENSIVE METABOLIC PANEL
ALT: 42 U/L (ref 0–44)
AST: 32 U/L (ref 15–41)
Albumin: 3.6 g/dL (ref 3.5–5.0)
Alkaline Phosphatase: 57 U/L (ref 38–126)
Anion gap: 11 (ref 5–15)
BUN: 23 mg/dL (ref 8–23)
CO2: 24 mmol/L (ref 22–32)
Calcium: 9.2 mg/dL (ref 8.9–10.3)
Chloride: 103 mmol/L (ref 98–111)
Creatinine, Ser: 1.28 mg/dL — ABNORMAL HIGH (ref 0.44–1.00)
GFR calc Af Amer: 44 mL/min — ABNORMAL LOW (ref >60)
GFR calc non Af Amer: 38 mL/min — ABNORMAL LOW (ref >60)
Glucose, Bld: 150 mg/dL — ABNORMAL HIGH (ref 70–99)
Potassium: 3.9 mmol/L (ref 3.5–5.1)
Sodium: 138 mmol/L (ref 135–145)
Total Bilirubin: 0.8 mg/dL (ref 0.3–1.2)
Total Protein: 7.1 g/dL (ref 6.5–8.1)

## 2020-03-16 NOTE — Assessment & Plan Note (Addendum)
#  Severe anemia hemoglobin-nadir 6 [summer 2020]. Likely CKD [ Bone marrow biopsy-mild dyserythropoietic changes].   #Hemoglobin today is  12.4;  hold Retacrit/IV iron.  Recommend PO iron/Vitron-C.March 2021- Iron studies: Saturation 13; ferritin 13.  Will plan IV iron in 4 weeks.   # CKD stage III-1.31- stable  # DISPOSITION:  # No retacrit today. # in 4 weeks- H&H- VENOFER # in  8weeks- MD- labs- cbc/bmp;possible Retacrit-/ Dr.B

## 2020-03-16 NOTE — Progress Notes (Signed)
Pt in for follow up, denies any concerns today. 

## 2020-03-22 ENCOUNTER — Other Ambulatory Visit: Payer: Self-pay | Admitting: Internal Medicine

## 2020-03-22 NOTE — Progress Notes (Signed)
[place] OFFICE PROGRESS NOTE  Patient Care Team: Einar Pheasant, MD as PCP - General (Internal Medicine) Einar Pheasant, MD (Internal Medicine) Isaias Cowman, MD (Internal Medicine) Albesa Seen, MD (Unknown Physician Specialty) Brendolyn Patty, MD (Specialist) Schnier, Dolores Lory, MD (Vascular Surgery) Leia Alf, MD (Inactive) as Referring Physician (Internal Medicine) Isaias Cowman, MD (Internal Medicine) Albesa Seen, MD (Unknown Physician Specialty) Philis Kendall, MD (Unknown Physician Specialty) Brendolyn Patty, MD (Specialist) Schnier, Dolores Lory, MD (Vascular Surgery)   SUMMARY OF HEMATOLOGIC HISTORY:  # IRON DEFICIENCY ANEMIA- recurrent [? GI blood loss; Dr.Elliot; AVM-capsule]; IV Ferrahem q 71m last colo- 2012/march; EGD-- [id number]  July 2020 bone marrow biopsy-mild dysplastic changes; 40% hypercellularity; cytogenetics-normal.-Retacrit.  #Colonoscopy-April 2020 aborted because of cardiac pause.  # CKD stage III-IV; CAD-Dr. PSaralyn Pilar INTERVAL HISTORY:   86-year-old female patient with above history of recurrent iron deficiency anemia unclear etiology -related to CKD is here for follow-up.  Patient denies any blood in stools or black or stools.  Denies any nausea vomiting.  Energy levels are adequate.   Review of Systems  Constitutional: Positive for malaise/fatigue. Negative for chills, diaphoresis, fever and weight loss.  HENT: Negative for nosebleeds and sore throat.   Eyes: Negative for double vision.  Respiratory: Negative for cough, hemoptysis, sputum production and wheezing.   Cardiovascular: Negative for palpitations, orthopnea and leg swelling.  Gastrointestinal: Negative for abdominal pain, blood in stool, constipation, diarrhea, heartburn, melena, nausea and vomiting.  Genitourinary: Negative for dysuria, frequency and urgency.  Musculoskeletal: Negative for back pain and joint pain.  Skin: Negative.  Negative for  itching and rash.  Neurological: Negative for dizziness, tingling, focal weakness, weakness and headaches.  Endo/Heme/Allergies: Does not bruise/bleed easily.  Psychiatric/Behavioral: Negative for depression. The patient is not nervous/anxious and does not have insomnia.      PAST MEDICAL HISTORY :  Past Medical History:  Diagnosis Date  . Arthritis   . AV malformation of gastrointestinal tract   . Blood in stool   . CAD (coronary artery disease)   . Carotid arterial disease ([place]   . Cataracts, bilateral   . Chronic blood loss anemia   . Chronic cystitis   . Complication of anesthesia    reaction to propofol  . Depression   . Diabetes mellitus ([place]   . GERD (gastroesophageal reflux disease)   . Hyperlipidemia   . Hypertension   . IDA (iron deficiency anemia)   . Stress incontinence     PAST SURGICAL HISTORY :   Past Surgical History:  Procedure Laterality Date  . ABDOMINAL HYSTERECTOMY  1972   ovaries left in place  . APPENDECTOMY  1992  . BACK SURGERY  06/08/2010   L3, L4, L5  . CAROTID ARTERY [place]OPLASTY  [place]03  [place]   right then left   . CHOLECYSTECTOMY  1993  . COLONOSCOPY WITH PROPOFOL N/A 02/02/2019   Procedure: COLONOSCOPY WITH PROPOFOL;  Surgeon: EManya Silvas MD;  [facility];  Service: Endoscopy;  Laterality: N/A;  . ESOPHAGOGASTRODUODENOSCOPY N/A 02/02/2019   Procedure: ESOPHAGOGASTRODUODENOSCOPY (EGD);  Surgeon: EManya Silvas MD;  [facility]  Service: Endoscopy;  Laterality: N/A;  . FOOT SURGERY  06/2007   Left  . right carotid artery surgery  01/09/13   Dr. SDelana Meyer@ [facility]  . TOTAL HIP ARTHROPLASTY  05/03/2011   left 12, right 11/07    FAMILY HISTORY :   Family History  Problem Relation Age of Onset  .  Hyperlipidemia Father   . Heart disease Father        myocardial infarction  . Hypertension Father        Parent  . Arthritis Other        parent  . Diabetes Other         nephew  . Cervical cancer Sister   . Rectal cancer Sister   . Breast cancer Neg Hx     SOCIAL HISTORY:   Social History   Tobacco Use  . Smoking status: Former Smoker    Types: Cigarettes    Quit date: 11/19/1989    Years since quitting: 30.3  . Smokeless tobacco: Never Used  Substance Use Topics  . Alcohol use: No    Alcohol/week: 0.0 standard drinks  . Drug use: No    ALLERGIES:  is allergic to ciprofloxacin; levaquin [levofloxacin]; propofol; and tequin [gatifloxacin].  MEDICATIONS:  Current Outpatient Medications  Medication Sig Dispense Refill  . aspirin 81 MG tablet Take 81 mg by mouth daily.    . Biotin 5000 MCG CAPS Take 1 capsule by mouth daily.    . blood glucose meter kit and supplies KIT Dispense based on insurance preference. Use daily to check sugars once daily. Dx e11.9 1 each 0  . cetirizine (ZYRTEC) 10 MG tablet Take 10 mg by mouth daily.    Marland Kitchen glucose blood test strip Use as instructed to check blood sugars once daily. Dx e11.9 100 each 12  . isosorbide mononitrate (IMDUR) 30 MG 24 hr tablet Take 30 mg by mouth daily.     . Lancets (ONETOUCH ULTRASOFT) lancets Use as instructed to check blood sugars once daily. DX e 11.9 100 each 12  . metFORMIN (GLUCOPHAGE) 500 MG tablet Take 1 tablet (500 mg total) by mouth daily with breakfast. 90 tablet 1  . nystatin (NYSTATIN) powder Apply 1 application topically in the morning and at bedtime. 30 g 0  . Omega-3 Fatty Acids (FISH OIL) 1200 MG CAPS Take 1 capsule by mouth daily.    . rosuvastatin (CRESTOR) 40 MG tablet Take 1 tablet (40 mg total) by mouth daily. 30 tablet 0  . traZODone (DESYREL) 50 MG tablet TAKE 0.5-1 TABLETS (25-50 MG TOTAL) BY MOUTH AT BEDTIME AS NEEDED FOR SLEEP. 90 tablet 1   No current facility-administered medications for this visit.    PHYSICAL EXAMINATION:   BP 121/66 (BP Location: Left Arm, Patient Position: Sitting)   Pulse 81   Temp (!) 96.4 F (35.8 C) (Tympanic)   Resp 18   Wt 138 lb  14.4 oz (63 kg)   SpO2 96%   BMI 28.05 kg/m   Filed Weights   03/16/20 1025  Weight: 138 lb 14.4 oz (63 kg)    Physical Exam  Constitutional: She is oriented to person, place, and time and well-developed, well-nourished, and in no distress.  Frail-appearing Caucasian female patient.  She is in a wheelchair.  HENT:  Head: Normocephalic and atraumatic.  Mouth/Throat: Oropharynx is clear and moist. No oropharyngeal exudate.  Eyes: Pupils are equal, round, and reactive to light.  Positive for pallor.  Cardiovascular: Normal rate and regular rhythm.  Pulmonary/Chest: No respiratory distress. She has no wheezes.  Decreased air entry at the bases.  Abdominal: Soft. Bowel sounds are normal. She exhibits no distension and no mass. There is no abdominal tenderness. There is no rebound and no guarding.  Musculoskeletal:        General: No tenderness or edema. Normal range of motion.  Cervical back: Normal range of motion and neck supple.  Neurological: She is alert and oriented to person, place, and time.  Skin: Skin is warm.  Psychiatric: Affect normal.    LABORATORY DATA:  I have reviewed the data as listed    Component Value Date/Time   NA 138 03/16/2020 0954   NA 143 01/10/2013 0314   K 3.9 03/16/2020 0954   K 4.0 01/10/2013 0314   CL 103 03/16/2020 0954   CL 109 (H) 01/10/2013 0314   CO2 24 03/16/2020 0954   CO2 27 01/10/2013 0314   GLUCOSE 150 (H) 03/16/2020 0954   GLUCOSE 119 (H) 01/10/2013 0314   BUN 23 03/16/2020 0954   BUN 15 01/10/2013 0314   CREATININE 1.28 (H) 03/16/2020 0954   CREATININE 1.03 01/10/2013 0314   CREATININE 1.13 (H) 09/18/2012 0848   CALCIUM 9.2 03/16/2020 0954   CALCIUM 8.1 (L) 01/10/2013 0314   PROT 7.1 03/16/2020 0954   PROT 7.2 12/20/2011 1521   ALBUMIN 3.6 03/16/2020 0954   ALBUMIN 3.9 12/20/2011 1521   AST 32 03/16/2020 0954   AST 31 12/20/2011 1521   ALT 42 03/16/2020 0954   ALT 37 12/20/2011 1521   ALKPHOS 57 03/16/2020 0954    ALKPHOS 57 12/20/2011 1521   BILITOT 0.8 03/16/2020 0954   BILITOT 0.5 12/20/2011 1521   GFRNONAA 38 (L) 03/16/2020 0954   GFRNONAA 52 (L) 01/10/2013 0314   GFRNONAA 47 (L) 09/18/2012 0848   GFRAA 44 (L) 03/16/2020 0954   GFRAA >60 01/10/2013 0314   GFRAA 54 (L) 09/18/2012 0848    No results found for: SPEP, UPEP  Lab Results  Component Value Date   WBC 7.2 03/16/2020   NEUTROABS 4.7 03/16/2020   HGB 12.4 03/16/2020   HCT 37.7 03/16/2020   MCV 92.6 03/16/2020   PLT 172 03/16/2020      Chemistry      Component Value Date/Time   NA 138 03/16/2020 0954   NA 143 01/10/2013 0314   K 3.9 03/16/2020 0954   K 4.0 01/10/2013 0314   CL 103 03/16/2020 0954   CL 109 (H) 01/10/2013 0314   CO2 24 03/16/2020 0954   CO2 27 01/10/2013 0314   BUN 23 03/16/2020 0954   BUN 15 01/10/2013 0314   CREATININE 1.28 (H) 03/16/2020 0954   CREATININE 1.03 01/10/2013 0314   CREATININE 1.13 (H) 09/18/2012 0848      Component Value Date/Time   CALCIUM 9.2 03/16/2020 0954   CALCIUM 8.1 (L) 01/10/2013 0314   ALKPHOS 57 03/16/2020 0954   ALKPHOS 57 12/20/2011 1521   AST 32 03/16/2020 0954   AST 31 12/20/2011 1521   ALT 42 03/16/2020 0954   ALT 37 12/20/2011 1521   BILITOT 0.8 03/16/2020 0954   BILITOT 0.5 12/20/2011 1521         ASSESSMENT & PLAN:   Anemia of chronic kidney failure, stage 3 (moderate) (HCC) #Severe anemia hemoglobin-nadir 6 [summer 2020]. Likely CKD [ Bone marrow biopsy-mild dyserythropoietic changes].   #Hemoglobin today is  12.4;  hold Retacrit/IV iron.  Recommend PO iron/Vitron-C.March 2021- Iron studies: Saturation 13; ferritin 13.  Will plan IV iron in 4 weeks.   # CKD stage III-1.31- stable  # DISPOSITION:  # No retacrit today. # in 4 weeks- H&H- VENOFER # in  8weeks- MD- labs- cbc/bmp;possible Retacrit-/ Dr.B       Cammie Sickle, MD 03/22/2020 8:49 AM

## 2020-04-07 ENCOUNTER — Telehealth: Payer: Self-pay | Admitting: Internal Medicine

## 2020-04-07 MED ORDER — TRAZODONE HCL 50 MG PO TABS: 25.0000 mg | ORAL_TABLET | Freq: Every evening | ORAL | 1 refills | Status: DC | PRN

## 2020-04-07 NOTE — Telephone Encounter (Signed)
Pt needs a refill on traZODone (DESYREL) [facility] [place]. Pt is out of medication

## 2020-04-13 ENCOUNTER — Telehealth: Payer: Self-pay | Admitting: Internal Medicine

## 2020-04-13 ENCOUNTER — Inpatient Hospital Stay: Payer: Medicare Other

## 2020-04-13 ENCOUNTER — Other Ambulatory Visit: Payer: Self-pay

## 2020-04-13 ENCOUNTER — Inpatient Hospital Stay: Payer: Medicare Other | Attending: Internal Medicine

## 2020-04-13 VITALS — BP 174/70 | HR 79 | Temp 96.6°F | Resp 16

## 2020-04-13 DIAGNOSIS — D631 Anemia in chronic kidney disease: Secondary | ICD-10-CM | POA: Diagnosis present

## 2020-04-13 DIAGNOSIS — N183 Chronic kidney disease, stage 3 unspecified: Secondary | ICD-10-CM | POA: Diagnosis present

## 2020-04-13 DIAGNOSIS — D508 Other iron deficiency anemias: Secondary | ICD-10-CM

## 2020-04-13 LAB — HEMATOCRIT: HCT: 37.3 % (ref 36.0–46.0)

## 2020-04-13 LAB — HEMOGLOBIN: Hemoglobin: 12.2 g/dL (ref 12.0–15.0)

## 2020-04-13 MED ORDER — METFORMIN HCL 500 MG PO TABS: 500.0000 mg | ORAL_TABLET | Freq: Every day | ORAL | 1 refills | Status: DC

## 2020-04-13 MED ORDER — SODIUM CHLORIDE 0.9 % IV SOLN
510.0000 mg | Freq: Once | INTRAVENOUS | Status: AC
  Administered 2020-04-13: 510 mg via INTRAVENOUS
  Filled 2020-04-13: qty 510

## 2020-04-13 MED ORDER — SODIUM CHLORIDE 0.9 % IV SOLN
Freq: Once | INTRAVENOUS | Status: AC
  Filled 2020-04-13: qty 250

## 2020-04-13 NOTE — Telephone Encounter (Signed)
Pt needs a refill on metFORMIN (GLUCOPHAGE) [facility] [place]. She only has a couple left

## 2020-04-13 NOTE — Progress Notes (Signed)
Patient note states patient is to receive Venofer today. Treatment plan states [facility] and patient has always received Fereheme. Per Dr. Jacinto Reap, Patient is to get [facility]   Patient's final BP is 174/70. Denies any s/s at this time. Per Dr. Rogue Bussing, patient is okay to d/c home. Educated patient on s/s that she should go to ED. Patient verbalizes understanding and denies any further questions or concerns.

## 2020-04-14 ENCOUNTER — Other Ambulatory Visit: Payer: Self-pay | Admitting: Internal Medicine

## 2020-04-14 NOTE — Progress Notes (Signed)
Pt on Ferrahem. GB

## 2020-04-20 ENCOUNTER — Other Ambulatory Visit: Payer: Self-pay | Admitting: Internal Medicine

## 2020-05-11 ENCOUNTER — Encounter: Payer: Self-pay | Admitting: Internal Medicine

## 2020-05-11 ENCOUNTER — Other Ambulatory Visit: Payer: Self-pay

## 2020-05-11 ENCOUNTER — Inpatient Hospital Stay: Payer: Medicare Other | Attending: Internal Medicine

## 2020-05-11 ENCOUNTER — Inpatient Hospital Stay: Payer: Medicare Other

## 2020-05-11 ENCOUNTER — Inpatient Hospital Stay (HOSPITAL_BASED_OUTPATIENT_CLINIC_OR_DEPARTMENT_OTHER): Payer: Medicare Other | Admitting: Internal Medicine

## 2020-05-11 DIAGNOSIS — D631 Anemia in chronic kidney disease: Secondary | ICD-10-CM | POA: Insufficient documentation

## 2020-05-11 DIAGNOSIS — E1122 Type 2 diabetes mellitus with diabetic chronic kidney disease: Secondary | ICD-10-CM | POA: Diagnosis not present

## 2020-05-11 DIAGNOSIS — N183 Chronic kidney disease, stage 3 unspecified: Secondary | ICD-10-CM | POA: Diagnosis not present

## 2020-05-11 DIAGNOSIS — I129 Hypertensive chronic kidney disease with stage 1 through stage 4 chronic kidney disease, or unspecified chronic kidney disease: Secondary | ICD-10-CM | POA: Diagnosis not present

## 2020-05-11 LAB — CBC WITH DIFFERENTIAL/PLATELET
Abs Immature Granulocytes: 0.01 10*3/uL (ref 0.00–0.07)
Basophils Absolute: 0.1 10*3/uL (ref 0.0–0.1)
Basophils Relative: 1 %
Eosinophils Absolute: 0.2 10*3/uL (ref 0.0–0.5)
Eosinophils Relative: 3 %
HCT: 38.7 % (ref 36.0–46.0)
Hemoglobin: 12.7 g/dL (ref 12.0–15.0)
Immature Granulocytes: 0 %
Lymphocytes Relative: 27 %
Lymphs Abs: 2 10*3/uL (ref 0.7–4.0)
MCH: 31.1 pg (ref 26.0–34.0)
MCHC: 32.8 g/dL (ref 30.0–36.0)
MCV: 94.9 fL (ref 80.0–100.0)
Monocytes Absolute: 0.4 10*3/uL (ref 0.1–1.0)
Monocytes Relative: 6 %
Neutro Abs: 4.6 10*3/uL (ref 1.7–7.7)
Neutrophils Relative %: 63 %
Platelets: 159 10*3/uL (ref 150–400)
RBC: 4.08 MIL/uL (ref 3.87–5.11)
RDW: 15.4 % (ref 11.5–15.5)
WBC: 7.4 10*3/uL (ref 4.0–10.5)
nRBC: 0 % (ref 0.0–0.2)

## 2020-05-11 LAB — BASIC METABOLIC PANEL
Anion gap: 10 (ref 5–15)
BUN: 24 mg/dL — ABNORMAL HIGH (ref 8–23)
CO2: 25 mmol/L (ref 22–32)
Calcium: 9.3 mg/dL (ref 8.9–10.3)
Chloride: 105 mmol/L (ref 98–111)
Creatinine, Ser: 1.09 mg/dL — ABNORMAL HIGH (ref 0.44–1.00)
GFR calc Af Amer: 54 mL/min — ABNORMAL LOW (ref >60)
GFR calc non Af Amer: 46 mL/min — ABNORMAL LOW (ref >60)
Glucose, Bld: 158 mg/dL — ABNORMAL HIGH (ref 70–99)
Potassium: 4.4 mmol/L (ref 3.5–5.1)
Sodium: 140 mmol/L (ref 135–145)

## 2020-05-11 NOTE — Assessment & Plan Note (Addendum)
#  Severe anemia hemoglobin-nadir 6 [summer 2020]. Likely CKD [ Bone marrow biopsy-mild dyserythropoietic changes].   #Hemoglobin today is  12.7;  hold Retacrit/IV iron.  Recommend PO iron/Vitron-C.March 2021- Iron studies: Saturation 13; ferritin 13.   # CKD stage III-1.31- stable  # DISPOSITION:  # No retacrit today. # in  2 months- MD- labs- cbc/bmp;iron studies/ferritin;possible Ferrahem-/ Dr.B

## 2020-05-11 NOTE — Progress Notes (Signed)
[place] OFFICE PROGRESS NOTE  Patient Care Team: Einar Pheasant, MD as PCP - General (Internal Medicine) Einar Pheasant, MD (Internal Medicine) Isaias Cowman, MD (Internal Medicine) Albesa Seen, MD (Unknown Physician Specialty) Brendolyn Patty, MD (Specialist) Schnier, Dolores Lory, MD (Vascular Surgery) Leia Alf, MD (Inactive) as Referring Physician (Internal Medicine) Isaias Cowman, MD (Internal Medicine) Albesa Seen, MD (Unknown Physician Specialty) Philis Kendall, MD (Unknown Physician Specialty) Brendolyn Patty, MD (Specialist) Schnier, Dolores Lory, MD (Vascular Surgery)   SUMMARY OF HEMATOLOGIC HISTORY:  # IRON DEFICIENCY ANEMIA- recurrent [? GI blood loss; Dr.Elliot; AVM-capsule]; IV Ferrahem q 79m last colo- 2012/march; EGD-- [id number]  July 2020 bone marrow biopsy-mild dysplastic changes; 40% hypercellularity; cytogenetics-normal.-Retacrit.  #Colonoscopy-April 2020 aborted because of cardiac pause.  # CKD stage III-IV; CAD-Dr. PSaralyn Pilar INTERVAL HISTORY:   89-year-old female patient with above history of recurrent iron deficiency anemia unclear etiology -related to CKD is here for follow-up.  Patient denies any blood in stools or black-colored stools. Appetite is good with no weight loss. Energy levels adequate.   Review of Systems  Constitutional: Positive for malaise/fatigue. Negative for chills, diaphoresis, fever and weight loss.  HENT: Negative for nosebleeds and sore throat.   Eyes: Negative for double vision.  Respiratory: Negative for cough, hemoptysis, sputum production and wheezing.   Cardiovascular: Negative for palpitations, orthopnea and leg swelling.  Gastrointestinal: Negative for abdominal pain, blood in stool, constipation, diarrhea, heartburn, melena, nausea and vomiting.  Genitourinary: Negative for dysuria, frequency and urgency.  Musculoskeletal: Negative for back pain and joint pain.  Skin: Negative.   Negative for itching and rash.  Neurological: Negative for dizziness, tingling, focal weakness, weakness and headaches.  Endo/Heme/Allergies: Does not bruise/bleed easily.  Psychiatric/Behavioral: Negative for depression. The patient is not nervous/anxious and does not have insomnia.      PAST MEDICAL HISTORY :  Past Medical History:  Diagnosis Date  . Arthritis   . AV malformation of gastrointestinal tract   . Blood in stool   . CAD (coronary artery disease)   . Carotid arterial disease ([place]   . Cataracts, bilateral   . Chronic blood loss anemia   . Chronic cystitis   . Complication of anesthesia    reaction to propofol  . Depression   . Diabetes mellitus ([place]   . GERD (gastroesophageal reflux disease)   . Hyperlipidemia   . Hypertension   . IDA (iron deficiency anemia)   . Stress incontinence     PAST SURGICAL HISTORY :   Past Surgical History:  Procedure Laterality Date  . ABDOMINAL HYSTERECTOMY  1972   ovaries left in place  . APPENDECTOMY  1992  . BACK SURGERY  06/08/2010   L3, L4, L5  . CAROTID ARTERY ANGIOPLASTY  11/03  . CO[place] 89   right then left   . CHOLECYSTECTOMY  1993  . COLONOSCOPY WITH PROPOFOL N/A 02/02/2019   Procedure: COLONOSCOPY WITH PROPOFOL;  Surgeon: EManya Silvas MD;  [facility];  Service: Endoscopy;  Laterality: N/A;  . ESOPHAGOGASTRODUODENOSCOPY N/A 02/02/2019   Procedure: ESOPHAGOGASTRODUODENOSCOPY (EGD);  Surgeon: EManya Silvas MD;  [facility];  Service: Endoscopy;  Laterality: N/A;  . FOOT SURGERY  06/2007   Left  . right carotid artery surgery  01/09/13   Dr. SDelana Meyer@ [facility]  . TOTAL HIP ARTHROPLASTY  05/03/2011   left 12, right 11/07    FAMILY HISTORY :   Family History  Problem Relation Age of Onset  .  Hyperlipidemia Father   . Heart disease Father        myocardial infarction  . Hypertension Father        Parent  . Arthritis Other        parent  . Diabetes Other         nephew  . Cervical cancer Sister   . Rectal cancer Sister   . Breast cancer Neg Hx     SOCIAL HISTORY:   Social History   Tobacco Use  . Smoking status: Former Smoker    Types: Cigarettes    Quit date: 11/19/1989    Years since quitting: 30.4  . Smokeless tobacco: Never Used  Substance Use Topics  . Alcohol use: No    Alcohol/week: 0.0 standard drinks  . Drug use: No    ALLERGIES:  is allergic to ciprofloxacin, levaquin [levofloxacin], propofol, and tequin [gatifloxacin].  MEDICATIONS:  Current Outpatient Medications  Medication Sig Dispense Refill  . aspirin 81 MG tablet Take 81 mg by mouth daily.    . Biotin 5000 MCG CAPS Take 1 capsule by mouth daily.    . blood glucose meter kit and supplies KIT Dispense based on insurance preference. Use daily to check sugars once daily. Dx e11.9 1 each 0  . cetirizine (ZYRTEC) 10 MG tablet Take 10 mg by mouth daily.    Marland Kitchen glucose blood test strip Use as instructed to check blood sugars once daily. Dx e11.9 100 each 12  . Lancets (ONETOUCH ULTRASOFT) lancets Use as instructed to check blood sugars once daily. DX e 11.9 100 each 12  . metFORMIN (GLUCOPHAGE) 500 MG tablet Take 1 tablet (500 mg total) by mouth daily with breakfast. 90 tablet 1  . nystatin (NYSTATIN) powder Apply 1 application topically in the morning and at bedtime. 30 g 0  . Omega-3 Fatty Acids (FISH OIL) 1200 MG CAPS Take 1 capsule by mouth daily.    . rosuvastatin (CRESTOR) 40 MG tablet TAKE 1 TABLET BY MOUTH EVERY DAY 30 tablet 0  . traZODone (DESYREL) 50 MG tablet Take 0.5-1 tablets (25-50 mg total) by mouth at bedtime as needed for sleep. 90 tablet 1  . isosorbide mononitrate (IMDUR) 30 MG 24 hr tablet Take 30 mg by mouth daily.      No current facility-administered medications for this visit.    PHYSICAL EXAMINATION:   BP (!) 149/61 (BP Location: Left Arm, Patient Position: Sitting, Cuff Size: Normal)   Pulse 79   Temp (!) 97.1 F (36.2 C) (Tympanic)   Wt  139 lb 4 oz (63.2 kg)   BMI 28.13 kg/m   Filed Weights   05/11/20 1315  Weight: 139 lb 4 oz (63.2 kg)    Physical Exam Constitutional:      Comments: Frail-appearing Caucasian female patient.  She is in a wheelchair.  HENT:     Head: Normocephalic and atraumatic.     Mouth/Throat:     Pharynx: No oropharyngeal exudate.  Eyes:     Pupils: Pupils are equal, round, and reactive to light.     Comments: Positive for pallor.  Cardiovascular:     Rate and Rhythm: Normal rate and regular rhythm.  Pulmonary:     Effort: No respiratory distress.     Breath sounds: No wheezing.  Abdominal:     General: Bowel sounds are normal. There is no distension.     Palpations: Abdomen is soft. There is no mass.     Tenderness: There is no abdominal tenderness.  There is no guarding or rebound.  Musculoskeletal:        General: No tenderness. Normal range of motion.     Cervical back: Normal range of motion and neck supple.  Skin:    General: Skin is warm.  Neurological:     Mental Status: She is alert and oriented to person, place, and time.  Psychiatric:        Mood and Affect: Affect normal.     LABORATORY DATA:  I have reviewed the data as listed    Component Value Date/Time   NA 140 05/11/2020 1255   NA 143 01/10/2013 0314   K 4.4 05/11/2020 1255   K 4.0 01/10/2013 0314   CL 105 05/11/2020 1255   CL 109 (H) 01/10/2013 0314   CO2 25 05/11/2020 1255   CO2 27 01/10/2013 0314   GLUCOSE 158 (H) 05/11/2020 1255   GLUCOSE 119 (H) 01/10/2013 0314   BUN 24 (H) 05/11/2020 1255   BUN 15 01/10/2013 0314   CREATININE 1.09 (H) 05/11/2020 1255   CREATININE 1.03 01/10/2013 0314   CREATININE 1.13 (H) 09/18/2012 0848   CALCIUM 9.3 05/11/2020 1255   CALCIUM 8.1 (L) 01/10/2013 0314   PROT 7.1 03/16/2020 0954   PROT 7.2 12/20/2011 1521   ALBUMIN 3.6 03/16/2020 0954   ALBUMIN 3.9 12/20/2011 1521   AST 32 03/16/2020 0954   AST 31 12/20/2011 1521   ALT 42 03/16/2020 0954   ALT 37 12/20/2011  1521   ALKPHOS 57 03/16/2020 0954   ALKPHOS 57 12/20/2011 1521   BILITOT 0.8 03/16/2020 0954   BILITOT 0.5 12/20/2011 1521   GFRNONAA 46 (L) 05/11/2020 1255   GFRNONAA 52 (L) 01/10/2013 0314   GFRNONAA 47 (L) 09/18/2012 0848   GFRAA 54 (L) 05/11/2020 1255   GFRAA >60 01/10/2013 0314   GFRAA 54 (L) 09/18/2012 0848    No results found for: SPEP, UPEP  Lab Results  Component Value Date   WBC 7.4 05/11/2020   NEUTROABS 4.6 05/11/2020   HGB 12.7 05/11/2020   HCT 38.7 05/11/2020   MCV 94.9 05/11/2020   PLT 159 05/11/2020      Chemistry      Component Value Date/Time   NA 140 05/11/2020 1255   NA 143 01/10/2013 0314   K 4.4 05/11/2020 1255   K 4.0 01/10/2013 0314   CL 105 05/11/2020 1255   CL 109 (H) 01/10/2013 0314   CO2 25 05/11/2020 1255   CO2 27 01/10/2013 0314   BUN 24 (H) 05/11/2020 1255   BUN 15 01/10/2013 0314   CREATININE 1.09 (H) 05/11/2020 1255   CREATININE 1.03 01/10/2013 0314   CREATININE 1.13 (H) 09/18/2012 0848      Component Value Date/Time   CALCIUM 9.3 05/11/2020 1255   CALCIUM 8.1 (L) 01/10/2013 0314   ALKPHOS 57 03/16/2020 0954   ALKPHOS 57 12/20/2011 1521   AST 32 03/16/2020 0954   AST 31 12/20/2011 1521   ALT 42 03/16/2020 0954   ALT 37 12/20/2011 1521   BILITOT 0.8 03/16/2020 0954   BILITOT 0.5 12/20/2011 1521         ASSESSMENT & PLAN:   Anemia of chronic kidney failure, stage 3 (moderate) (HCC) #Severe anemia hemoglobin-nadir 6 [summer 2020]. Likely CKD [ Bone marrow biopsy-mild dyserythropoietic changes].   #Hemoglobin today is  12.7;  hold Retacrit/IV iron.  Recommend PO iron/Vitron-C.March 2021- Iron studies: Saturation 13; ferritin 13.   # CKD stage III-1.31- stable  # DISPOSITION:  #  No retacrit today. # in  2 months- MD- labs- cbc/bmp;iron studies/ferritin;possible Ferrahem-/ Dr.B       Cammie Sickle, MD 05/12/2020 2:29 PM

## 2020-05-17 ENCOUNTER — Other Ambulatory Visit: Payer: Self-pay | Admitting: Internal Medicine

## 2020-06-15 ENCOUNTER — Other Ambulatory Visit: Payer: Self-pay | Admitting: Internal Medicine

## 2020-06-23 ENCOUNTER — Other Ambulatory Visit: Payer: Self-pay | Admitting: Internal Medicine

## 2020-07-07 ENCOUNTER — Encounter: Payer: Medicare Other | Admitting: Internal Medicine

## 2020-07-12 ENCOUNTER — Inpatient Hospital Stay: Payer: Medicare Other

## 2020-07-12 ENCOUNTER — Inpatient Hospital Stay: Payer: Medicare Other | Attending: Internal Medicine

## 2020-07-12 ENCOUNTER — Inpatient Hospital Stay (HOSPITAL_BASED_OUTPATIENT_CLINIC_OR_DEPARTMENT_OTHER): Payer: Medicare Other | Admitting: Internal Medicine

## 2020-07-12 ENCOUNTER — Other Ambulatory Visit: Payer: Self-pay

## 2020-07-12 DIAGNOSIS — D631 Anemia in chronic kidney disease: Secondary | ICD-10-CM | POA: Diagnosis not present

## 2020-07-12 DIAGNOSIS — N183 Chronic kidney disease, stage 3 unspecified: Secondary | ICD-10-CM

## 2020-07-12 LAB — CBC WITH DIFFERENTIAL/PLATELET
Abs Immature Granulocytes: 0.01 10*3/uL (ref 0.00–0.07)
Basophils Absolute: 0.1 10*3/uL (ref 0.0–0.1)
Basophils Relative: 1 %
Eosinophils Absolute: 0.2 10*3/uL (ref 0.0–0.5)
Eosinophils Relative: 3 %
HCT: 37.9 % (ref 36.0–46.0)
Hemoglobin: 12.8 g/dL (ref 12.0–15.0)
Immature Granulocytes: 0 %
Lymphocytes Relative: 27 %
Lymphs Abs: 2 10*3/uL (ref 0.7–4.0)
MCH: 32 pg (ref 26.0–34.0)
MCHC: 33.8 g/dL (ref 30.0–36.0)
MCV: 94.8 fL (ref 80.0–100.0)
Monocytes Absolute: 0.5 10*3/uL (ref 0.1–1.0)
Monocytes Relative: 6 %
Neutro Abs: 4.8 10*3/uL (ref 1.7–7.7)
Neutrophils Relative %: 63 %
Platelets: 176 10*3/uL (ref 150–400)
RBC: 4 MIL/uL (ref 3.87–5.11)
RDW: 13.8 % (ref 11.5–15.5)
WBC: 7.5 10*3/uL (ref 4.0–10.5)
nRBC: 0 % (ref 0.0–0.2)

## 2020-07-12 LAB — BASIC METABOLIC PANEL
Anion gap: 9 (ref 5–15)
BUN: 29 mg/dL — ABNORMAL HIGH (ref 8–23)
CO2: 26 mmol/L (ref 22–32)
Calcium: 9.4 mg/dL (ref 8.9–10.3)
Chloride: 104 mmol/L (ref 98–111)
Creatinine, Ser: 1.22 mg/dL — ABNORMAL HIGH (ref 0.44–1.00)
GFR calc Af Amer: 46 mL/min — ABNORMAL LOW (ref >60)
GFR calc non Af Amer: 40 mL/min — ABNORMAL LOW (ref >60)
Glucose, Bld: 138 mg/dL — ABNORMAL HIGH (ref 70–99)
Potassium: 4.2 mmol/L (ref 3.5–5.1)
Sodium: 139 mmol/L (ref 135–145)

## 2020-07-12 LAB — IRON AND TIBC
Iron: 56 ug/dL (ref 28–170)
Saturation Ratios: 14 % (ref 10.4–31.8)
TIBC: 391 ug/dL (ref 250–450)
UIBC: 335 ug/dL

## 2020-07-12 LAB — FERRITIN: Ferritin: 17 ng/mL (ref 11–307)

## 2020-07-12 NOTE — Assessment & Plan Note (Addendum)
#  Severe anemia hemoglobin-nadir 6 [summer 2020]. Likely CKD [ Bone marrow biopsy-mild dyserythropoietic changes].   #Hemoglobin today is  12.7;  hold Retacrit/IV iron.  Recommend PO iron/Vitron-C.March 2021- Iron studies: Saturation 13; ferritin 13.  Await iron studies from today.  Again reminded patient to pick Vitron-C from the pharmacy.  Recommend every other day  # CKD stage III-1.2- GFR- 47-STABLE.    # DISPOSITION:  # No Ferrahem today.  # in  2 months- MD- labs- cbc/bmp;;possible Ferrahem-/ Dr.B

## 2020-07-12 NOTE — Patient Instructions (Signed)
#   Take Vitron C one tablet every other day

## 2020-07-12 NOTE — Progress Notes (Signed)
[place] OFFICE PROGRESS NOTE  Patient Care Team: Einar Pheasant, MD as PCP - General (Internal Medicine) Einar Pheasant, MD (Internal Medicine) Isaias Cowman, MD (Internal Medicine) Albesa Seen, MD (Unknown Physician Specialty) Brendolyn Patty, MD (Specialist) Schnier, Dolores Lory, MD (Vascular Surgery) Leia Alf, MD (Inactive) as Referring Physician (Internal Medicine) Isaias Cowman, MD (Internal Medicine) Albesa Seen, MD (Unknown Physician Specialty) Philis Kendall, MD (Unknown Physician Specialty) Brendolyn Patty, MD (Specialist) Schnier, Dolores Lory, MD (Vascular Surgery)   SUMMARY OF HEMATOLOGIC HISTORY:  # IRON DEFICIENCY ANEMIA- recurrent [? GI blood loss; Dr.Elliot; AVM-capsule]; IV Ferrahem q 32m last colo- 2012/march; EGD-- [id number]  July 2020 bone marrow biopsy-mild dysplastic changes; 40% hypercellularity; cytogenetics-normal.-Retacrit.  #Colonoscopy-April 2020 aborted because of cardiac pause.  # CKD stage III-IV; CAD-Dr. PSaralyn Pilar INTERVAL HISTORY:   86-year-old female patient with above history of recurrent iron deficiency anemia unclear etiology -related to CKD is here for follow-up.  Patient did not pick her iron/Vitron-C from the pharmacy as recommended last visit.  She is concerned about constipation.  Otherwise denies any blood in stools or black or stools.  No weight loss.  No nausea no vomiting.  Energy levels are adequate.   Review of Systems  Constitutional: Positive for malaise/fatigue. Negative for chills, diaphoresis, fever and weight loss.  HENT: Negative for nosebleeds and sore throat.   Eyes: Negative for double vision.  Respiratory: Negative for cough, hemoptysis, sputum production and wheezing.   Cardiovascular: Negative for palpitations, orthopnea and leg swelling.  Gastrointestinal: Negative for abdominal pain, blood in stool, constipation, diarrhea, heartburn, melena, nausea and vomiting.   Genitourinary: Negative for dysuria, frequency and urgency.  Musculoskeletal: Negative for back pain and joint pain.  Skin: Negative.  Negative for itching and rash.  Neurological: Negative for dizziness, tingling, focal weakness, weakness and headaches.  Endo/Heme/Allergies: Does not bruise/bleed easily.  Psychiatric/Behavioral: Negative for depression. The patient is not nervous/anxious and does not have insomnia.      PAST MEDICAL HISTORY :  Past Medical History:  Diagnosis Date  . Arthritis   . AV malformation of gastrointestinal tract   . Blood in stool   . CAD (coronary artery disease)   . Car[place]id arterial disease ([place]   . Cataracts, bilateral   . Chronic blood loss anemia   . Chronic cystitis   . Complication of anesthesia    reaction to propofol  . Depression   . Diabetes mellitus ([place]   . GERD (gastroesophageal reflux disease)   . Hyperlipidemia   . Hypertension   . IDA (iron deficiency anemia)   . Stress incontinence     PAST SURGICAL HISTORY :   Past Surgical History:  Procedure Laterality Date  . ABDOMINAL HYSTERECTOMY  1972   ovaries left in place  . APPENDECTOMY  1992  . BACK SURGERY  06/08/2010   L3, L4, L5  . CAROTID ARTERY ANGIOPLASTY  11/03  . [place] 89   right then left   . CHOLECYSTECTOMY  1993  . COLONOSCOPY WITH PROPOFOL N/A 02/02/2019   Procedure: COLONOSCOPY WITH PROPOFOL;  Surgeon: EManya Silvas MD;  [facility]  Service: Endoscopy;  Laterality: N/A;  . ESOPHAGOGASTRODUODENOSCOPY N/A 02/02/2019   Procedure: ESOPHAGOGASTRODUODENOSCOPY (EGD);  Surgeon: EManya Silvas MD;  [facility];  Service: Endoscopy;  Laterality: N/A;  . FOOT SURGERY  06/2007   Left  . right carotid artery surgery  01/09/13   Dr. SDelana Meyer@ [facility]  . TOTAL HIP ARTHROPLASTY  05/03/2011   left 12, right 11/07    FAMILY HISTORY :   Family History  Problem Relation Age of Onset  . Hyperlipidemia Father   . Heart  disease Father        myocardial infarction  . Hypertension Father        Parent  . Arthritis Other        parent  . Diabetes Other        nephew  . Cervical cancer Sister   . Rectal cancer Sister   . Breast cancer Neg Hx     SOCIAL HISTORY:   Social History   Tobacco Use  . Smoking status: Former Smoker    Types: Cigarettes    Quit date: 11/19/1989    Years since quitting: 30.6  . Smokeless tobacco: Never Used  Substance Use Topics  . Alcohol use: No    Alcohol/week: 0.0 standard drinks  . Drug use: No    ALLERGIES:  is allergic to ciprofloxacin, levaquin [levofloxacin], propofol, and tequin [gatifloxacin].  MEDICATIONS:  Current Outpatient Medications  Medication Sig Dispense Refill  . aspirin 81 MG tablet Take 81 mg by mouth daily.    . Biotin 5000 MCG CAPS Take 1 capsule by mouth daily.    . blood glucose meter kit and supplies KIT Dispense based on insurance preference. Use daily to check sugars once daily. Dx e11.9 1 each 0  . cetirizine (ZYRTEC) 10 MG tablet Take 10 mg by mouth daily.    Marland Kitchen glucose blood test strip Use as instructed to check blood sugars once daily. Dx e11.9 100 each 12  . isosorbide mononitrate (IMDUR) 30 MG 24 hr tablet Take 30 mg by mouth daily.     . Lancets (ONETOUCH ULTRASOFT) lancets Use as instructed to check blood sugars once daily. DX e 11.9 100 each 12  . metFORMIN (GLUCOPHAGE) 500 MG tablet Take 1 tablet (500 mg total) by mouth daily with breakfast. 90 tablet 1  . Omega-3 Fatty Acids (FISH OIL) 1200 MG CAPS Take 1 capsule by mouth daily.    . rosuvastatin (CRESTOR) 40 MG tablet TAKE 1 TABLET BY MOUTH EVERY DAY 30 tablet 0  . traZODone (DESYREL) 50 MG tablet Take 0.5-1 tablets (25-50 mg total) by mouth at bedtime as needed for sleep. 90 tablet 1   No current facility-administered medications for this visit.    PHYSICAL EXAMINATION:   BP 125/65   Pulse 83   Temp 98.3 F (36.8 C) (Tympanic)   Resp 18   Ht 4' 11"  (1.499 m)   Wt 138  lb (62.6 kg)   BMI 27.87 kg/m   Filed Weights   07/12/20 1307  Weight: 138 lb (62.6 kg)    Physical Exam Constitutional:      Comments: [place] Caucasian female patient.  She is in a wheelchair.  HENT:     Head: Normocephalic and atraumatic.     Mouth/Throat:     Pharynx: No oropharyngeal exudate.  Eyes:     Pupils: Pupils are equal, round, and reactive to light.     Comments: Positive for pallor.  Cardiovascular:     Rate and Rhythm: Normal rate and regular rhythm.  Pulmonary:     Effort: No respiratory distress.     Breath sounds: No wheezing.  Abdominal:     General: Bowel sounds are normal. There is no distension.     Palpations: Abdomen is soft. There is no mass.     Tenderness: There is no  abdominal tenderness. There is no guarding or rebound.  Musculoskeletal:        General: No tenderness. Normal range of motion.     Cervical back: Normal range of motion and neck supple.  Skin:    General: Skin is warm.  Neurological:     Mental Status: She is alert and oriented to person, place, and time.  Psychiatric:        Mood and Affect: Affect normal.     LABORATORY DATA:  I have reviewed the data as listed    Component Value Date/Time   NA 139 07/12/2020 1240   NA 143 01/10/2013 0314   K 4.2 07/12/2020 1240   K 4.0 01/10/2013 0314   CL 104 07/12/2020 1240   CL 109 (H) 01/10/2013 0314   CO2 26 07/12/2020 1240   CO2 27 01/10/2013 0314   GLUCOSE 138 (H) 07/12/2020 1240   GLUCOSE 119 (H) 01/10/2013 0314   BUN 29 (H) 07/12/2020 1240   BUN 15 01/10/2013 0314   CREATININE 1.22 (H) 07/12/2020 1240   CREATININE 1.03 01/10/2013 0314   CREATININE 1.13 (H) 09/18/2012 0848   CALCIUM 9.4 07/12/2020 1240   CALCIUM 8.1 (L) 01/10/2013 0314   PROT 7.1 03/16/2020 0954   PROT 7.2 12/20/2011 1521   ALBUMIN 3.6 03/16/2020 0954   ALBUMIN 3.9 12/20/2011 1521   AST 32 03/16/2020 0954   AST 31 12/20/2011 1521   ALT 42 03/16/2020 0954   ALT 37 12/20/2011 1521    ALKPHOS 57 03/16/2020 0954   ALKPHOS 57 12/20/2011 1521   BILITOT 0.8 03/16/2020 0954   BILITOT 0.5 12/20/2011 1521   GFRNONAA 40 (L) 07/12/2020 1240   GFRNONAA 52 (L) 01/10/2013 0314   GFRNONAA 47 (L) 09/18/2012 0848   GFRAA 46 (L) 07/12/2020 1240   GFRAA >60 01/10/2013 0314   GFRAA 54 (L) 09/18/2012 0848    No results found for: SPEP, UPEP  Lab Results  Component Value Date   WBC 7.5 07/12/2020   NEUTROABS 4.8 07/12/2020   HGB 12.8 07/12/2020   HCT 37.9 07/12/2020   MCV 94.8 07/12/2020   PLT 176 07/12/2020      Chemistry      Component Value Date/Time   NA 139 07/12/2020 1240   NA 143 01/10/2013 0314   K 4.2 07/12/2020 1240   K 4.0 01/10/2013 0314   CL 104 07/12/2020 1240   CL 109 (H) 01/10/2013 0314   CO2 26 07/12/2020 1240   CO2 27 01/10/2013 0314   BUN 29 (H) 07/12/2020 1240   BUN 15 01/10/2013 0314   CREATININE 1.22 (H) 07/12/2020 1240   CREATININE 1.03 01/10/2013 0314   CREATININE 1.13 (H) 09/18/2012 0848      Component Value Date/Time   CALCIUM 9.4 07/12/2020 1240   CALCIUM 8.1 (L) 01/10/2013 0314   ALKPHOS 57 03/16/2020 0954   ALKPHOS 57 12/20/2011 1521   AST 32 03/16/2020 0954   AST 31 12/20/2011 1521   ALT 42 03/16/2020 0954   ALT 37 12/20/2011 1521   BILITOT 0.8 03/16/2020 0954   BILITOT 0.5 12/20/2011 1521         ASSESSMENT & PLAN:   Anemia of chronic kidney failure, stage 3 (moderate) (HCC) #Severe anemia hemoglobin-nadir 6 [summer 2020]. Likely CKD [ Bone marrow biopsy-mild dyserythropoietic changes].   #Hemoglobin today is  12.7;  hold Retacrit/IV iron.  Recommend PO iron/Vitron-C.March 2021- Iron studies: Saturation 13; ferritin 13.  Await iron studies from today.  Again reminded patient  to pick Vitron-C from the pharmacy.  Recommend every other day  # CKD stage III-1.2- GFR- 47-STABLE.    # DISPOSITION:  # No Ferrahem today.  # in  2 months- MD- labs- cbc/bmp;;possible Ferrahem-/ Dr.B       Cammie Sickle,  MD 07/12/2020 2:13 PM

## 2020-07-14 ENCOUNTER — Other Ambulatory Visit: Payer: Self-pay | Admitting: Internal Medicine

## 2020-08-04 ENCOUNTER — Other Ambulatory Visit: Payer: Self-pay | Admitting: Internal Medicine

## 2020-08-22 ENCOUNTER — Other Ambulatory Visit: Payer: Self-pay

## 2020-08-22 ENCOUNTER — Ambulatory Visit (INDEPENDENT_AMBULATORY_CARE_PROVIDER_SITE_OTHER): Payer: Medicare Other

## 2020-08-22 ENCOUNTER — Encounter (INDEPENDENT_AMBULATORY_CARE_PROVIDER_SITE_OTHER): Payer: Self-pay | Admitting: Vascular Surgery

## 2020-08-22 ENCOUNTER — Ambulatory Visit (INDEPENDENT_AMBULATORY_CARE_PROVIDER_SITE_OTHER): Payer: Medicare Other | Admitting: Vascular Surgery

## 2020-08-22 VITALS — BP 161/75 | HR 85 | Ht 59.0 in | Wt 132.0 lb

## 2020-08-22 DIAGNOSIS — I779 Disorder of arteries and arterioles, unspecified: Secondary | ICD-10-CM | POA: Diagnosis not present

## 2020-08-22 DIAGNOSIS — I2510 Atherosclerotic heart disease of native coronary artery without angina pectoris: Secondary | ICD-10-CM | POA: Diagnosis not present

## 2020-08-22 DIAGNOSIS — I4891 Unspecified atrial fibrillation: Secondary | ICD-10-CM

## 2020-08-22 DIAGNOSIS — I10 Essential (primary) hypertension: Secondary | ICD-10-CM | POA: Diagnosis not present

## 2020-08-22 DIAGNOSIS — E119 Type 2 diabetes mellitus without complications: Secondary | ICD-10-CM

## 2020-08-23 ENCOUNTER — Encounter (INDEPENDENT_AMBULATORY_CARE_PROVIDER_SITE_OTHER): Payer: Self-pay | Admitting: Vascular Surgery

## 2020-08-23 NOTE — Progress Notes (Signed)
MRN : [id number]  Jocelyn Elliott is a 86 y.o. (10-06-1932) female who presents with chief complaint of  Chief Complaint  Patient presents with  . Follow-up    U/S follow up  .  History of Present Illness:   The patient is seen for follow up evaluation of carotid stenosis. The carotid stenosis followed by ultrasound.   The patient denies amaurosis fugax. There is no recent history of TIA symptoms or focal motor deficits. There is no prior documented CVA.  The patient is taking enteric-coated aspirin 81 mg daily.  There is no history of migraine headaches. There is no history of seizures.  The patient has a history of coronary artery disease, no recent episodes of angina or shortness of breath. The patient denies PAD or claudication symptoms. There is a history of hyperlipidemia which is being treated with a statin.   Carotid Duplex done today showspatent right CEA and 93% LICA stenosis. No change compared to last study in 08/20/2019  Current Meds  Medication Sig  . aspirin 81 MG tablet Take 81 mg by mouth daily.  . Biotin 5000 MCG CAPS Take 1 capsule by mouth daily.  . blood glucose meter kit and supplies KIT Dispense based on insurance preference. Use daily to check sugars once daily. Dx e11.9  . cetirizine (ZYRTEC) 10 MG tablet Take 10 mg by mouth daily.  Marland Kitchen glucose blood test strip Use as instructed to check blood sugars once daily. Dx e11.9  . isosorbide mononitrate (IMDUR) 30 MG 24 hr tablet Take 30 mg by mouth daily.   . Lancets (ONETOUCH ULTRASOFT) lancets Use as instructed to check blood sugars once daily. DX e 11.9  . metFORMIN (GLUCOPHAGE) 500 MG tablet TAKE 1 TABLET BY MOUTH EVERY DAY WITH BREAKFAST  . Omega-3 Fatty Acids (FISH OIL) 1200 MG CAPS Take 1 capsule by mouth daily.  . rosuvastatin (CRESTOR) 40 MG tablet TAKE 1 TABLET BY MOUTH EVERY DAY  . traZODone (DESYREL) 50 MG tablet Take 0.5-1 tablets (25-50 mg total) by mouth at bedtime as needed for sleep.      Past Medical History:  Diagnosis Date  . Arthritis   . AV malformation of gastrointestinal tract   . Blood in stool   . CAD (coronary artery disease)   . Carotid arterial disease ([place])   . Cataracts, bilateral   . Chronic blood loss anemia   . Chronic cystitis   . Complication of anesthesia    reaction to propofol  . Depression   . Diabetes mellitus ([place])   . GERD (gastroesophageal reflux disease)   . Hyperlipidemia   . Hypertension   . IDA (iron deficiency anemia)   . Stress incontinence     Past Surgical History:  Procedure Laterality Date  . ABDOMINAL HYSTERECTOMY  1972   ovaries left in place  . APPENDECTOMY  1992  . BACK SURGERY  06/08/2010   L3, L4, L5  . CAROTID ARTERY ANGIOPLASTY  11/03  . [place], 92   right then left   . CHOLECYSTECTOMY  1993  . COLONOSCOPY WITH PROPOFOL N/A 02/02/2019   Procedure: COLONOSCOPY WITH PROPOFOL;  Surgeon: Manya Silvas, MD;  Location: [facility] ENDOSCOPY;  Service: Endoscopy;  Laterality: N/A;  . ESOPHAGOGASTRODUODENOSCOPY N/A 02/02/2019   Procedure: ESOPHAGOGASTRODUODENOSCOPY (EGD);  Surgeon: Manya Silvas, MD;  Location: [facility] ENDOSCOPY;  Service: Endoscopy;  Laterality: N/A;  . FOOT SURGERY  06/2007   Left  . right carotid artery surgery  01/09/13   Dr. Delana Meyer @ [facility]  . TOTAL HIP ARTHROPLASTY  05/03/2011   left 12, right 11/07    Social History Social History   Tobacco Use  . Smoking status: Former Smoker    Types: Cigarettes    Quit date: 11/19/1989    Years since quitting: 30.7  . Smokeless tobacco: Never Used  Substance Use Topics  . Alcohol use: No    Alcohol/week: 0.0 standard drinks  . Drug use: No    Family History Family History  Problem Relation Age of Onset  . Hyperlipidemia Father   . Heart disease Father        myocardial infarction  . Hypertension Father        Parent  . Arthritis Other        parent  . Diabetes Other        nephew  . Cervical cancer Sister   .  Rectal cancer Sister   . Breast cancer Neg Hx     Allergies  Allergen Reactions  . Ciprofloxacin   . Levaquin [Levofloxacin]   . Propofol Other (See Comments)  . Tequin [Gatifloxacin] Hives and Rash     REVIEW OF SYSTEMS (Negative unless checked)  Constitutional: [] Weight loss  [] Fever  [] Chills Cardiac: [] Chest pain   [] Chest pressure   [] Palpitations   [] Shortness of breath when laying flat   [] Shortness of breath with exertion. Vascular:  [] Pain in legs with walking   [] Pain in legs at rest  [] History of DVT   [] Phlebitis   [] Swelling in legs   [] Varicose veins   [] Non-healing ulcers Pulmonary:   [] Uses home oxygen   [] Productive cough   [] Hemoptysis   [] Wheeze  [] COPD   [] Asthma Neurologic:  [] Dizziness   [] Seizures   [] History of stroke   [] History of TIA  [] Aphasia   [] Vissual changes   [] Weakness or numbness in arm   [] Weakness or numbness in leg Musculoskeletal:   [] Joint swelling   [x] Joint pain   [] Low back pain Hematologic:  [] Easy bruising  [] Easy bleeding   [] Hypercoagulable state   [] Anemic Gastrointestinal:  [] Diarrhea   [] Vomiting  [] Gastroesophageal reflux/heartburn   [] Difficulty swallowing. Genitourinary:  [] Chronic kidney disease   [] Difficult urination  [] Frequent urination   [] Blood in urine Skin:  [] Rashes   [] Ulcers  Psychological:  [] History of anxiety   []  History of major depression.  Physical Examination  Vitals:   08/22/20 1341  BP: (!) 161/75  Pulse: 85  Weight: 132 lb (59.9 kg)  Height: 4' 11"  (1.499 m)   Body mass index is 26.66 kg/m. Gen: WD/WN, NAD Head: [place]/AT, No temporalis wasting.  Ear/Nose/Throat: Hearing grossly intact, nares w/o erythema or drainage Eyes: PER, EOMI, sclera nonicteric.  Neck: Supple, no large masses.   Pulmonary:  Good air movement, no audible wheezing bilaterally, no use of accessory muscles.  Cardiac: RRR, no JVD Vascular:  Left carotid bruit Vessel Right Left  Radial Palpable Palpable  Carotid Palpable Palpable   Gastrointestinal: Non-distended. No guarding/no peritoneal signs.  Musculoskeletal: M/S 5/5 throughout.  No deformity or atrophy.  Neurologic: CN 2-12 intact. Symmetrical.  Speech is fluent. Motor exam as listed above. Psychiatric: Judgment intact, Mood & affect appropriate for pt's clinical situation. Dermatologic: No rashes or ulcers noted.  No changes consistent with cellulitis.  CBC Lab Results  Component Value Date   WBC 7.5 07/12/2020   HGB 12.8 07/12/2020   HCT 37.9 07/12/2020   MCV 94.8 07/12/2020   PLT 176  07/12/2020    BMET    Component Value Date/Time   NA 139 07/12/2020 1240   NA 143 01/10/2013 0314   K 4.2 07/12/2020 1240   K 4.0 01/10/2013 0314   CL 104 07/12/2020 1240   CL 109 (H) 01/10/2013 0314   CO2 26 07/12/2020 1240   CO2 27 01/10/2013 0314   GLUCOSE 138 (H) 07/12/2020 1240   GLUCOSE 119 (H) 01/10/2013 0314   BUN 29 (H) 07/12/2020 1240   BUN 15 01/10/2013 0314   CREATININE 1.22 (H) 07/12/2020 1240   CREATININE 1.03 01/10/2013 0314   CREATININE 1.13 (H) 09/18/2012 0848   CALCIUM 9.4 07/12/2020 1240   CALCIUM 8.1 (L) 01/10/2013 0314   GFRNONAA 40 (L) 07/12/2020 1240   GFRNONAA 52 (L) 01/10/2013 0314   GFRNONAA 47 (L) 09/18/2012 0848   GFRAA 46 (L) 07/12/2020 1240   GFRAA >60 01/10/2013 0314   GFRAA 54 (L) 09/18/2012 0848   CrCl cannot be calculated (Patient's most recent lab result is older than the maximum 21 days allowed.).  COAG Lab Results  Component Value Date   INR 1.0 06/15/2019   INR 1.0 01/10/2013    Radiology VAS US CAROTID  Result Date: 08/22/2020 Carotid Arterial Duplex Study Indications:       Carotid artery disease. Risk Factors:      Hypertension, hyperlipidemia. Other Factors:     01/09/2013 Right CEA. 10/08/2002 Left CEA. Comparison Study:  08/20/2019 Performing Technologist: Almira Coaster RVS  Examination Guidelines: A complete evaluation includes B-mode imaging, spectral Doppler, color Doppler, and power Doppler as needed  of all accessible portions of each vessel. Bilateral testing is considered an integral part of a complete examination. Limited examinations for reoccurring indications may be performed as noted.  Right Carotid Findings: +----------+--------+--------+--------+------------------+--------+           PSV cm/sEDV cm/sStenosisPlaque DescriptionComments +----------+--------+--------+--------+------------------+--------+ CCA Prox  70      11                                         +----------+--------+--------+--------+------------------+--------+ CCA Mid   95      13                                         +----------+--------+--------+--------+------------------+--------+ CCA Distal76      12                                         +----------+--------+--------+--------+------------------+--------+ ICA Prox  60      14                                         +----------+--------+--------+--------+------------------+--------+ ICA Mid   69      17                                         +----------+--------+--------+--------+------------------+--------+ ICA Distal61      18                                         +----------+--------+--------+--------+------------------+--------+  ECA       99      0                                          +----------+--------+--------+--------+------------------+--------+ +----------+--------+-------+--------+-------------------+           PSV cm/sEDV cmsDescribeArm Pressure (mmHG) +----------+--------+-------+--------+-------------------+ Subclavian135     0                                  +----------+--------+-------+--------+-------------------+ +---------+--------+--+--------+-+ VertebralPSV cm/s38EDV cm/s9 +---------+--------+--+--------+-+  Left Carotid Findings: +----------+--------+--------+--------+------------------+--------+           PSV cm/sEDV cm/sStenosisPlaque DescriptionComments  +----------+--------+--------+--------+------------------+--------+ CCA Prox  80      11                                         +----------+--------+--------+--------+------------------+--------+ CCA Mid   78      12                                         +----------+--------+--------+--------+------------------+--------+ CCA Distal82      10                                         +----------+--------+--------+--------+------------------+--------+ ICA Prox  255     47                                         +----------+--------+--------+--------+------------------+--------+ ICA Mid   194     31                                         +----------+--------+--------+--------+------------------+--------+ ICA Distal152     23                                         +----------+--------+--------+--------+------------------+--------+ ECA       195     0                                          +----------+--------+--------+--------+------------------+--------+ +----------+--------+--------+--------+-------------------+           PSV cm/sEDV cm/sDescribeArm Pressure (mmHG) +----------+--------+--------+--------+-------------------+ [id number]     0                                   +----------+--------+--------+--------+-------------------+  +---------+--------+--+--------+--+ VertebralPSV cm/s47EDV cm/s13 +---------+--------+--+--------+--+   Summary: Right Carotid: Velocities in the right ICA are consistent with a 1-39% stenosis. Left Carotid: Velocities in the left ICA are consistent with a 40-59% stenosis. Vertebrals:  Bilateral vertebral arteries demonstrate antegrade flow. Subclavians: Normal flow hemodynamics  were seen in bilateral subclavian              arteries. *See table(s) above for measurements and observations.  Electronically signed by Hortencia Pilar MD on 08/22/2020 at 4:31:54 PM.    Final      Assessment/Plan 1. Carotid artery  disease, unspecified laterality, unspecified type ([place]) Recommend:  Given the patient's asymptomatic subcritical stenosis no further invasive testing or surgery at this time.  Duplex ultrasound shows<50%stenosis bilaterally.  Continue antiplatelet therapy as prescribed Continue management of CAD, HTN and Hyperlipidemia Healthy heart diet, encouraged exercise at least 4 times per week  Follow up in37month with duplex ultrasound and physical exam   - VAS [place]; Future  2. Coronary artery disease involving native coronary artery of native heart without angina pectoris Continue cardiac and antihypertensive medications as already ordered and reviewed, no changes at this time.  Continue statin as ordered and reviewed, no changes at this time  Nitrates PRN for chest pain   3. Atrial fibrillation, unspecified type ([place] Continue antiarrhythmia medications as already ordered, these medications have been reviewed and there are no changes at this time.  Continue anticoagulation as ordered by Cardiology Service   4. Primary hypertension Continue antihypertensive medications as already ordered, these medications have been reviewed and there are no changes at this time.   5. Type 2 diabetes mellitus without complication, unspecified whether long term insulin use ([place] Continue hypoglycemic medications as already ordered, these medications have been reviewed and there are no changes at this time.  Hgb A1C to be monitored as already arranged by primary service    GHortencia Pilar MD  08/23/2020 10:17 AM

## 2020-08-30 ENCOUNTER — Other Ambulatory Visit: Payer: Self-pay | Admitting: Internal Medicine

## 2020-09-13 ENCOUNTER — Other Ambulatory Visit: Payer: Self-pay

## 2020-09-13 ENCOUNTER — Inpatient Hospital Stay (HOSPITAL_BASED_OUTPATIENT_CLINIC_OR_DEPARTMENT_OTHER): Payer: Medicare Other | Admitting: Internal Medicine

## 2020-09-13 ENCOUNTER — Inpatient Hospital Stay: Payer: Medicare Other

## 2020-09-13 ENCOUNTER — Inpatient Hospital Stay: Payer: Medicare Other | Attending: Internal Medicine

## 2020-09-13 ENCOUNTER — Encounter: Payer: Self-pay | Admitting: Internal Medicine

## 2020-09-13 VITALS — BP 158/70 | HR 77 | Temp 97.0°F | Resp 17

## 2020-09-13 DIAGNOSIS — E1122 Type 2 diabetes mellitus with diabetic chronic kidney disease: Secondary | ICD-10-CM | POA: Insufficient documentation

## 2020-09-13 DIAGNOSIS — N183 Chronic kidney disease, stage 3 unspecified: Secondary | ICD-10-CM | POA: Insufficient documentation

## 2020-09-13 DIAGNOSIS — D631 Anemia in chronic kidney disease: Secondary | ICD-10-CM | POA: Diagnosis present

## 2020-09-13 DIAGNOSIS — I129 Hypertensive chronic kidney disease with stage 1 through stage 4 chronic kidney disease, or unspecified chronic kidney disease: Secondary | ICD-10-CM | POA: Insufficient documentation

## 2020-09-13 DIAGNOSIS — D508 Other iron deficiency anemias: Secondary | ICD-10-CM

## 2020-09-13 LAB — CBC WITH DIFFERENTIAL/PLATELET
Abs Immature Granulocytes: 0.02 10*3/uL (ref 0.00–0.07)
Basophils Absolute: 0.1 10*3/uL (ref 0.0–0.1)
Basophils Relative: 1 %
Eosinophils Absolute: 0.2 10*3/uL (ref 0.0–0.5)
Eosinophils Relative: 3 %
HCT: 37.9 % (ref 36.0–46.0)
Hemoglobin: 12.5 g/dL (ref 12.0–15.0)
Immature Granulocytes: 0 %
Lymphocytes Relative: 27 %
Lymphs Abs: 2.1 10*3/uL (ref 0.7–4.0)
MCH: 31.3 pg (ref 26.0–34.0)
MCHC: 33 g/dL (ref 30.0–36.0)
MCV: 94.8 fL (ref 80.0–100.0)
Monocytes Absolute: 0.5 10*3/uL (ref 0.1–1.0)
Monocytes Relative: 7 %
Neutro Abs: 4.7 10*3/uL (ref 1.7–7.7)
Neutrophils Relative %: 62 %
Platelets: 161 10*3/uL (ref 150–400)
RBC: 4 MIL/uL (ref 3.87–5.11)
RDW: 13.2 % (ref 11.5–15.5)
WBC: 7.6 10*3/uL (ref 4.0–10.5)
nRBC: 0 % (ref 0.0–0.2)

## 2020-09-13 LAB — BASIC METABOLIC PANEL
Anion gap: 8 (ref 5–15)
BUN: 22 mg/dL (ref 8–23)
CO2: 24 mmol/L (ref 22–32)
Calcium: 8.9 mg/dL (ref 8.9–10.3)
Chloride: 106 mmol/L (ref 98–111)
Creatinine, Ser: 1.16 mg/dL — ABNORMAL HIGH (ref 0.44–1.00)
GFR, Estimated: 46 mL/min — ABNORMAL LOW (ref >60)
Glucose, Bld: 140 mg/dL — ABNORMAL HIGH (ref 70–99)
Potassium: 4 mmol/L (ref 3.5–5.1)
Sodium: 138 mmol/L (ref 135–145)

## 2020-09-13 MED ORDER — SODIUM CHLORIDE 0.9 % IV SOLN
510.0000 mg | Freq: Once | INTRAVENOUS | Status: AC
  Administered 2020-09-13: 510 mg via INTRAVENOUS
  Filled 2020-09-13: qty 510

## 2020-09-13 MED ORDER — SODIUM CHLORIDE 0.9 % IV SOLN
Freq: Once | INTRAVENOUS | Status: AC
  Filled 2020-09-13: qty 250

## 2020-09-13 NOTE — Progress Notes (Signed)
Pt tolerated infusion well, no s/s of distress or reaction noted. Pt and VS stable at discharge.  

## 2020-09-13 NOTE — Progress Notes (Signed)
[facility] OFFICE PROGRESS NOTE  Patient Care Team: Einar Pheasant, MD as PCP - General (Internal Medicine) Einar Pheasant, MD (Internal Medicine) Isaias Cowman, MD (Internal Medicine) Albesa Seen, MD (Unknown Physician Specialty) Brendolyn Patty, MD (Specialist) Schnier, Dolores Lory, MD (Vascular Surgery) Leia Alf, MD (Inactive) as Referring Physician (Internal Medicine) Isaias Cowman, MD (Internal Medicine) Albesa Seen, MD (Unknown Physician Specialty) Philis Kendall, MD (Unknown Physician Specialty) Brendolyn Patty, MD (Specialist) Schnier, Dolores Lory, MD (Vascular Surgery)   SUMMARY OF HEMATOLOGIC HISTORY:  # IRON DEFICIENCY ANEMIA- recurrent [? GI blood loss; Dr.Elliot; AVM-capsule]; IV Ferrahem q 46m last colo- 2012/march; EGD-- [id number]  July 2020 bone marrow biopsy-mild dysplastic changes; 40% hypercellularity; cytogenetics-normal.-Retacrit.  #Colonoscopy-April 2020 aborted because of cardiac pause.  # CKD stage III-IV; CAD-Dr. PSaralyn Pilar INTERVAL HISTORY:   86-year-old female patient with above history of recurrent iron deficiency anemia unclear etiology -related to CKD is here for follow-up.  Patient denies any nausea vomiting.  Denies any blood in stool or black-colored stool.  Denies any constipation.  She is taking iron pill every other day.  She is babysitting her great grandchild.   Review of Systems  Constitutional: Positive for malaise/fatigue. Negative for chills, diaphoresis, fever and weight loss.  HENT: Negative for nosebleeds and sore throat.   Eyes: Negative for double vision.  Respiratory: Negative for cough, hemoptysis, sputum production and wheezing.   Cardiovascular: Negative for palpitations, orthopnea and leg swelling.  Gastrointestinal: Negative for abdominal pain, blood in stool, constipation, diarrhea, heartburn, melena, nausea and vomiting.  Genitourinary: Negative for dysuria, frequency and urgency.   Musculoskeletal: Negative for back pain and joint pain.  Skin: Negative.  Negative for itching and rash.  Neurological: Negative for dizziness, tingling, focal weakness, weakness and headaches.  Endo/Heme/Allergies: Does not bruise/bleed easily.  Psychiatric/Behavioral: Negative for depression. The patient is not nervous/anxious and does not have insomnia.      PAST MEDICAL HISTORY :  Past Medical History:  Diagnosis Date  . Arthritis   . AV malformation of gastrointestinal tract   . Blood in stool   . CAD (coronary artery disease)   . Carotid arterial disease ([place]   . Cataracts, bilateral   . Chronic blood loss anemia   . Chronic cystitis   . Complication of anesthesia    reaction to propofol  . Depression   . Diabetes mellitus ([place]   . GERD (gastroesophageal reflux disease)   . Hyperlipidemia   . Hypertension   . IDA (iron deficiency anemia)   . Stress incontinence     PAST SURGICAL HISTORY :   Past Surgical History:  Procedure Laterality Date  . ABDOMINAL HYSTERECTOMY  1972   ovaries left in place  . APPENDECTOMY  1992  . BACK SURGERY  06/08/2010   L3, L4, L5  . CAR[place]  [place]  [place]   [place]   . CHOLECYSTECTOMY  1993  . COLONOSCOPY WITH PROPOFOL N/A 02/02/2019   Procedure: COLONOSCOPY WITH PROPOFOL;  Surgeon: EManya Silvas MD;  [facility];  Service: Endoscopy;  Laterality: N/A;  . ESOPHAGOGASTRODUODENOSCOPY N/A 02/02/2019   Procedure: ESOPHAGOGASTRODUODENOSCOPY (EGD);  Surgeon: EManya Silvas MD;  [facility];  Service: Endoscopy;  Laterality: N/A;  . FOOT SURGERY  06/2007   Left  . right carotid artery surgery  01/09/13   Dr. SDelana Meyer@ [facility]  . TOTAL HIP ARTHROPLASTY  05/03/2011   left 12, right 11/07  FAMILY HISTORY :   Family History  Problem Relation Age of Onset  . Hyperlipidemia Father   . Heart disease Father        myocardial infarction  . Hypertension  Father        Parent  . Arthritis Other        parent  . Diabetes Other        nephew  . Cervical cancer Sister   . Rectal cancer Sister   . Breast cancer Neg Hx     SOCIAL HISTORY:   Social History   Tobacco Use  . Smoking status: Former Smoker    Types: Cigarettes    Quit date: 11/19/1989    Years since quitting: 30.8  . Smokeless tobacco: Never Used  Substance Use Topics  . Alcohol use: No    Alcohol/week: 0.0 standard drinks  . Drug use: No    ALLERGIES:  is allergic to ciprofloxacin, levaquin [levofloxacin], propofol, and tequin [gatifloxacin].  MEDICATIONS:  Current Outpatient Medications  Medication Sig Dispense Refill  . aspirin 81 MG tablet Take 81 mg by mouth daily.    . Biotin 5000 MCG CAPS Take 1 capsule by mouth daily.    . blood glucose meter kit and supplies KIT Dispense based on insurance preference. Use daily to check sugars once daily. Dx e11.9 1 each 0  . cetirizine (ZYRTEC) 10 MG tablet Take 10 mg by mouth daily.    . ferrous sulfate 325 (65 FE) MG EC tablet Take by mouth.    Marland Kitchen glucose blood test strip Use as instructed to check blood sugars once daily. Dx e11.9 100 each 12  . isosorbide mononitrate (IMDUR) 30 MG 24 hr tablet Take 30 mg by mouth daily.     . Lancets (ONETOUCH ULTRASOFT) lancets Use as instructed to check blood sugars once daily. DX e 11.9 100 each 12  . metFORMIN (GLUCOPHAGE) 500 MG tablet TAKE 1 TABLET BY MOUTH EVERY DAY WITH BREAKFAST 90 tablet 1  . Omega-3 Fatty Acids (FISH OIL) 1200 MG CAPS Take 1 capsule by mouth daily.    . rosuvastatin (CRESTOR) 40 MG tablet TAKE 1 TABLET BY MOUTH EVERY DAY 30 tablet 0  . traZODone (DESYREL) 50 MG tablet Take 0.5-1 tablets (25-50 mg total) by mouth at bedtime as needed for sleep. 90 tablet 1   No current facility-administered medications for this visit.   Facility-Administered Medications Ordered in Other Visits  Medication Dose Route Frequency Provider Last Rate Last Admin  . ferumoxytol  (FERAHEME) 510 mg in sodium chloride 0.9 % 100 mL IVPB  510 mg Intravenous Once Cammie Sickle, MD        PHYSICAL EXAMINATION:   BP 110/61 (BP Location: Left Arm, Patient Position: Sitting, Cuff Size: Normal)   Pulse 74   Temp 98 F (36.7 C) (Tympanic)   Resp 16   Ht 4' 11"  (8.416 m)   Wt 140 lb (63.5 kg)   SpO2 96%   BMI 28.28 kg/m   Filed Weights   09/13/20 1314  Weight: 140 lb (63.5 kg)    Physical Exam Constitutional:      Comments: [place] Caucasian female patient.  She is in a wheelchair.  HENT:     Head: Normocephalic and atraumatic.     Mouth/Throat:     Pharynx: No oropharyngeal exudate.  Eyes:     Pupils: Pupils are equal, round, and reactive to light.     Comments: Positive for pallor.  Cardiovascular:  Rate and Rhythm: Normal rate and regular rhythm.  Pulmonary:     Effort: No respiratory distress.     Breath sounds: No wheezing.  Abdominal:     General: Bowel sounds are normal. There is no distension.     Palpations: Abdomen is soft. There is no mass.     Tenderness: There is no abdominal tenderness. There is no guarding or rebound.  Musculoskeletal:        General: No tenderness. Normal range of motion.     Cervical back: Normal range of motion and neck supple.  Skin:    General: Skin is warm.  Neurological:     Mental Status: She is alert and oriented to person, place, and time.  Psychiatric:        Mood and Affect: Affect normal.     LABORATORY DATA:  I have reviewed the data as listed    Component Value Date/Time   NA 138 09/13/2020 1249   NA 143 01/10/2013 0314   K 4.0 09/13/2020 1249   K 4.0 01/10/2013 0314   CL 106 09/13/2020 1249   CL 109 (H) 01/10/2013 0314   CO2 24 09/13/2020 1249   CO2 27 01/10/2013 0314   GLUCOSE 140 (H) 09/13/2020 1249   GLUCOSE 119 (H) 01/10/2013 0314   BUN 22 09/13/2020 1249   BUN 15 01/10/2013 0314   CREATININE 1.16 (H) 09/13/2020 1249   CREATININE 1.03 01/10/2013 0314   CREATININE  1.13 (H) 09/18/2012 0848   CALCIUM 8.9 09/13/2020 1249   CALCIUM 8.1 (L) 01/10/2013 0314   PROT 7.1 03/16/2020 0954   PROT 7.2 12/20/2011 1521   ALBUMIN 3.6 03/16/2020 0954   ALBUMIN 3.9 12/20/2011 1521   AST 32 03/16/2020 0954   AST 31 12/20/2011 1521   ALT 42 03/16/2020 0954   ALT 37 12/20/2011 1521   ALKPHOS 57 03/16/2020 0954   ALKPHOS 57 12/20/2011 1521   BILITOT 0.8 03/16/2020 0954   BILITOT 0.5 12/20/2011 1521   GFRNONAA 46 (L) 09/13/2020 1249   GFRNONAA 52 (L) 01/10/2013 0314   GFRNONAA 47 (L) 09/18/2012 0848   GFRAA 46 (L) 07/12/2020 1240   GFRAA >60 01/10/2013 0314   GFRAA 54 (L) 09/18/2012 0848    No results found for: SPEP, UPEP  Lab Results  Component Value Date   WBC 7.6 09/13/2020   NEUTROABS 4.7 09/13/2020   HGB 12.5 09/13/2020   HCT 37.9 09/13/2020   MCV 94.8 09/13/2020   PLT 161 09/13/2020      Chemistry      Component Value Date/Time   NA 138 09/13/2020 1249   NA 143 01/10/2013 0314   K 4.0 09/13/2020 1249   K 4.0 01/10/2013 0314   CL 106 09/13/2020 1249   CL 109 (H) 01/10/2013 0314   CO2 24 09/13/2020 1249   CO2 27 01/10/2013 0314   BUN 22 09/13/2020 1249   BUN 15 01/10/2013 0314   CREATININE 1.16 (H) 09/13/2020 1249   CREATININE 1.03 01/10/2013 0314   CREATININE 1.13 (H) 09/18/2012 0848      Component Value Date/Time   CALCIUM 8.9 09/13/2020 1249   CALCIUM 8.1 (L) 01/10/2013 0314   ALKPHOS 57 03/16/2020 0954   ALKPHOS 57 12/20/2011 1521   AST 32 03/16/2020 0954   AST 31 12/20/2011 1521   ALT 42 03/16/2020 0954   ALT 37 12/20/2011 1521   BILITOT 0.8 03/16/2020 0954   BILITOT 0.5 12/20/2011 1521         ASSESSMENT &  PLAN:   Anemia of chronic kidney failure, stage 3 (moderate) (HCC) #Severe anemia hemoglobin-nadir 6 [summer 2020]. Likely CKD [ Bone marrow biopsy-mild dyserythropoietic changes].   #Hemoglobin today is  12.7; proceed with IV Feraheme today [especially with the holidays].  Continue PO iron/Vitron-C every other  day AUG 021- Iron studies: Saturation 13; ferritin 17  # CKD stage III-1.2- GFR- 47-STABLE.    # DISPOSITION:  # Ferrahem today.  # in 3 months- MD- labs- cbc/bmp;iron studies/ferritin;possible Ferrahem-/ Dr.B       Cammie Sickle, MD 09/13/2020 2:14 PM

## 2020-09-13 NOTE — Assessment & Plan Note (Signed)
#  Severe anemia hemoglobin-nadir 6 [summer 2020]. Likely CKD [ Bone marrow biopsy-mild dyserythropoietic changes].   #Hemoglobin today is  12.7; proceed with IV Feraheme today [especially with the holidays].  Continue PO iron/Vitron-C every other day AUG 021- Iron studies: Saturation 13; ferritin 17  # CKD stage III-1.2- GFR- 47-STABLE.    # DISPOSITION:  # Ferrahem today.  # in 3 months- MD- labs- cbc/bmp;iron studies/ferritin;possible Ferrahem-/ Dr.B

## 2020-09-13 NOTE — Addendum Note (Signed)
Addended by: Delice Bison E on: 09/13/2020 03:28 PM   Modules accepted: Orders

## 2020-09-29 ENCOUNTER — Other Ambulatory Visit: Payer: Self-pay | Admitting: Internal Medicine

## 2020-10-01 ENCOUNTER — Other Ambulatory Visit: Payer: Self-pay | Admitting: Internal Medicine

## 2020-10-03 ENCOUNTER — Encounter: Payer: Self-pay | Admitting: Internal Medicine

## 2020-10-03 ENCOUNTER — Other Ambulatory Visit: Payer: Self-pay

## 2020-10-03 ENCOUNTER — Other Ambulatory Visit: Payer: Self-pay | Admitting: Internal Medicine

## 2020-10-03 ENCOUNTER — Ambulatory Visit: Payer: Medicare Other | Admitting: Internal Medicine

## 2020-10-03 VITALS — BP 124/76 | HR 87 | Temp 97.5°F | Resp 16 | Ht 59.0 in | Wt 139.8 lb

## 2020-10-03 DIAGNOSIS — E7800 Pure hypercholesterolemia, unspecified: Secondary | ICD-10-CM | POA: Diagnosis not present

## 2020-10-03 DIAGNOSIS — R945 Abnormal results of liver function studies: Secondary | ICD-10-CM

## 2020-10-03 DIAGNOSIS — I10 Essential (primary) hypertension: Secondary | ICD-10-CM

## 2020-10-03 DIAGNOSIS — E1159 Type 2 diabetes mellitus with other circulatory complications: Secondary | ICD-10-CM | POA: Diagnosis not present

## 2020-10-03 DIAGNOSIS — D649 Anemia, unspecified: Secondary | ICD-10-CM

## 2020-10-03 DIAGNOSIS — I779 Disorder of arteries and arterioles, unspecified: Secondary | ICD-10-CM

## 2020-10-03 DIAGNOSIS — N183 Chronic kidney disease, stage 3 unspecified: Secondary | ICD-10-CM

## 2020-10-03 DIAGNOSIS — Z1231 Encounter for screening mammogram for malignant neoplasm of breast: Secondary | ICD-10-CM | POA: Diagnosis not present

## 2020-10-03 DIAGNOSIS — Z23 Encounter for immunization: Secondary | ICD-10-CM | POA: Diagnosis not present

## 2020-10-03 DIAGNOSIS — R7989 Other specified abnormal findings of blood chemistry: Secondary | ICD-10-CM

## 2020-10-03 DIAGNOSIS — I2510 Atherosclerotic heart disease of native coronary artery without angina pectoris: Secondary | ICD-10-CM

## 2020-10-03 DIAGNOSIS — I4891 Unspecified atrial fibrillation: Secondary | ICD-10-CM

## 2020-10-03 LAB — HEMOGLOBIN A1C: Hgb A1c MFr Bld: 7.4 % — ABNORMAL HIGH (ref 4.6–6.5)

## 2020-10-03 LAB — BASIC METABOLIC PANEL
BUN: 20 mg/dL (ref 6–23)
CO2: 29 mEq/L (ref 19–32)
Calcium: 9.6 mg/dL (ref 8.4–10.5)
Chloride: 104 mEq/L (ref 96–112)
Creatinine, Ser: 1.21 mg/dL — ABNORMAL HIGH (ref 0.40–1.20)
GFR: 40.64 mL/min — ABNORMAL LOW (ref >60.00)
Glucose, Bld: 99 mg/dL (ref 70–99)
Potassium: 4.3 mEq/L (ref 3.5–5.1)
Sodium: 140 mEq/L (ref 135–145)

## 2020-10-03 LAB — LIPID PANEL
Cholesterol: 136 mg/dL (ref 0–200)
HDL: 63.7 mg/dL (ref >39.00)
LDL Cholesterol: 39 mg/dL (ref 0–99)
NonHDL: 72.76
Total CHOL/HDL Ratio: 2
Triglycerides: 169 mg/dL — ABNORMAL HIGH (ref 0.0–149.0)
VLDL: 33.8 mg/dL (ref 0.0–40.0)

## 2020-10-03 LAB — HEPATIC FUNCTION PANEL
ALT: 41 U/L — ABNORMAL HIGH (ref 0–35)
AST: 29 U/L (ref 0–37)
Albumin: 4.1 g/dL (ref 3.5–5.2)
Alkaline Phosphatase: 55 U/L (ref 39–117)
Bilirubin, Direct: 0.1 mg/dL (ref 0.0–0.3)
Total Bilirubin: 0.6 mg/dL (ref 0.2–1.2)
Total Protein: 6.8 g/dL (ref 6.0–8.3)

## 2020-10-03 LAB — HM DIABETES FOOT EXAM

## 2020-10-03 NOTE — Progress Notes (Signed)
Order placed for f/u lab.   

## 2020-10-03 NOTE — Progress Notes (Signed)
Patient ID: Jocelyn Elliott, female   DOB: May 20, 1932, 86 y.o.   MRN: [id number]   Subjective:    Patient ID: Jocelyn Elliott, female    DOB: 24-Oct-1932, 86 y.o.   MRN: [id number]  HPI This visit occurred during the SARS-CoV-2 public health emergency.  Safety protocols were in place, including screening questions prior to the visit, additional usage of staff PPE, and extensive cleaning of exam room while observing appropriate contact time as indicated for disinfecting solutions.  Patient here for her physical exam.  She reports she is doing relatively well.  Her and her daughter are keeping her greatgrandson.  This is going well.  No chest pain or sob.  No acid reflux.  No abdominal pain.  Bowels moving.  No blood.  Sugars attached and reviewed.  Staying <200.  (averaging mostly 130-170s).  Taking iron qod now.  Tolerating.  Seeing hematology.  Receives IV iron infusions.  hgb 12.5 last check.  Sees cardiology.  Stable.    Past Medical History:  Diagnosis Date  . Arthritis   . AV malformation of gastrointestinal tract   . Blood in stool   . CAD (coronary artery disease)   . Carotid arterial disease ([place])   . Cataracts, bilateral   . Chronic blood loss anemia   . Chronic cystitis   . Complication of anesthesia    reaction to propofol  . Depression   . Diabetes mellitus ([place])   . GERD (gastroesophageal reflux disease)   . Hyperlipidemia   . Hypertension   . IDA (iron deficiency anemia)   . Stress incontinence    Past Surgical History:  Procedure Laterality Date  . ABDOMINAL HYSTERECTOMY  1972   ovaries left in place  . APPENDECTOMY  1992  . BACK SURGERY  06/08/2010   L3, L4, L5  . CAROTID ARTERY ANGIOPLASTY  11/03  . [place], 92   right then left   . CHOLECYSTECTOMY  1993  . COLONOSCOPY WITH PROPOFOL N/A 02/02/2019   Procedure: COLONOSCOPY WITH PROPOFOL;  Surgeon: Manya Silvas, MD;  Location[facility] END[facility]COPY;  Service: Endoscopy;  Laterality: N/A;  .  ESOPHAGOGASTRODUODENOSCOPY N/A 02/02/2019   Procedure: ESOPHAGOGASTRODUODENOSCOPY (EGD);  Surgeon: Manya Silvas, MD;  Location: [facility] ENDOSCOPY;  Service: Endoscopy;  Laterality: N/A;  . FOOT SURGERY  06/2007   Left  . right carotid artery surgery  01/09/13   Dr. Delana Meyer @ [facility]  . TOTAL HIP ARTHROPLASTY  05/03/2011   left 12, right 11/07   Family History  Problem Relation Age of Onset  . Hyperlipidemia Father   . Heart disease Father        myocardial infarction  . Hypertension Father        Parent  . Arthritis Other        parent  . Diabetes Other        nephew  . Cervical cancer Sister   . Rectal cancer Sister   . Breast cancer Neg Hx    Social History   Socioeconomic History  . Marital status: Widowed    Spouse name: Not on file  . Number of children: 1  . Years of education: 47  . Highest education level: Not on file  Occupational History  . Occupation: Retired  Tobacco Use  . Smoking status: Former Smoker    Types: Cigarettes    Quit date: 11/19/1989    Years since quitting: 30.9  . Smokeless tobacco: Never Used  Substance and Sexual  Activity  . Alcohol use: No    Alcohol/week: 0.0 standard drinks  . Drug use: No  . Sexual activity: Not Currently  Other Topics Concern  . Not on file  Social History Narrative   Regular exercise-no   Caffeine Use-yes   Social Determinants of Health   Financial Resource Strain: Low Risk   . Difficulty of Paying Living Expenses: Not hard at all  Food Insecurity: No Food Insecurity  . Worried About Charity fundraiser in the Last Year: Never true  . Ran Out of Food in the Last Year: Never true  Transportation Needs: No Transportation Needs  . Lack of Transportation (Medical): No  . Lack of Transportation (Non-Medical): No  Physical Activity:   . Days of Exercise per Week: Not on file  . Minutes of Exercise per Session: Not on file  Stress: No Stress Concern Present  . Feeling of Stress : Not at all  Social  Connections: Unknown  . Frequency of Communication with Friends and Family: More than three times a week  . Frequency of Social Gatherings with Friends and Family: Three times a week  . Attends Religious Services: Not on file  . Active Member of Clubs or Organizations: Not on file  . Attends Archivist Meetings: Not on file  . Marital Status: Widowed    Outpatient Encounter Medications as of 10/03/2020  Medication Sig  . aspirin 81 MG tablet Take 81 mg by mouth daily.  . Biotin 5000 MCG CAPS Take 1 capsule by mouth daily.  . blood glucose meter kit and supplies KIT Dispense based on insurance preference. Use daily to check sugars once daily. Dx e11.9  . cetirizine (ZYRTEC) 10 MG tablet Take 10 mg by mouth daily.  . ferrous sulfate 325 (65 FE) MG EC tablet Take by mouth.  . isosorbide mononitrate (IMDUR) 30 MG 24 hr tablet Take 30 mg by mouth daily.   . Lancets (ONETOUCH ULTRASOFT) lancets Use as instructed to check blood sugars once daily. DX e 11.9  . metFORMIN (GLUCOPHAGE) 500 MG tablet TAKE 1 TABLET BY MOUTH EVERY DAY WITH BREAKFAST  . Omega-3 Fatty Acids (FISH OIL) 1200 MG CAPS Take 1 capsule by mouth daily.  Glory Rosebush ULTRA test strip USE AS INSTRUCTED TO CHECK BLOOD SUGARS ONCE DAILY. DX E11.9  . rosuvastatin (CRESTOR) 40 MG tablet TAKE 1 TABLET BY MOUTH EVERY DAY  . traZODone (DESYREL) 50 MG tablet Take 0.5-1 tablets (25-50 mg total) by mouth at bedtime as needed for sleep.   No facility-administered encounter medications on file as of 10/03/2020.    Review of Systems  Constitutional: Negative for appetite change and unexpected weight change.  HENT: Negative for congestion, sinus pressure and sore throat.   Eyes: Negative for pain and visual disturbance.  Respiratory: Negative for cough, chest tightness and shortness of breath.   Cardiovascular: Negative for chest pain, palpitations and leg swelling.  Gastrointestinal: Negative for abdominal pain, diarrhea, nausea  and vomiting.  Genitourinary: Negative for difficulty urinating and dysuria.  Musculoskeletal: Negative for joint swelling and myalgias.  Skin: Negative for color change and rash.  Neurological: Negative for dizziness, light-headedness and headaches.  Hematological: Negative for adenopathy. Does not bruise/bleed easily.  Psychiatric/Behavioral: Negative for agitation and decreased concentration.       Objective:    Physical Exam Constitutional:      General: She is not in acute distress.    Appearance: Normal appearance. She is well-developed.  HENT:  Head: Normocephalic and atraumatic.     Right Ear: External ear normal.     Left Ear: External ear normal.  Eyes:     General: No scleral icterus.       Right eye: No discharge.        Left eye: No discharge.     Conjunctiva/sclera: Conjunctivae normal.  Neck:     Thyroid: No thyromegaly.  Cardiovascular:     Rate and Rhythm: Normal rate.     Comments: Rate controlled.  Pulmonary:     Effort: No tachypnea, accessory muscle usage or respiratory distress.     Breath sounds: Normal breath sounds. No decreased breath sounds or wheezing.  Chest:     Breasts:        Right: No inverted nipple, mass, nipple discharge or tenderness (no axillary adenopathy).        Left: No inverted nipple, mass, nipple discharge or tenderness (no axilarry adenopathy).  Abdominal:     General: Bowel sounds are normal.     Palpations: Abdomen is soft.     Tenderness: There is no abdominal tenderness.  Musculoskeletal:        General: No swelling or tenderness.     Cervical back: Neck supple. No tenderness.  Lymphadenopathy:     Cervical: No cervical adenopathy.  Skin:    General: Skin is warm.     Findings: No erythema or rash.  Neurological:     Mental Status: She is alert and oriented to person, place, and time.  Psychiatric:        Mood and Affect: Mood normal.        Behavior: Behavior normal.     BP 124/76   Pulse 87   Temp (!)  97.5 F (36.4 C) (Oral)   Resp 16   Ht 4' 11"  (1.499 m)   Wt 139 lb 12.8 oz (63.4 kg)   SpO2 98%   BMI 28.24 kg/m  Wt Readings from Last 3 Encounters:  10/03/20 139 lb 12.8 oz (63.4 kg)  09/13/20 140 lb (63.5 kg)  08/22/20 132 lb (59.9 kg)     Lab Results  Component Value Date   WBC 7.6 09/13/2020   HGB 12.5 09/13/2020   HCT 37.9 09/13/2020   PLT 161 09/13/2020   GLUCOSE 99 10/03/2020   CHOL 136 10/03/2020   TRIG 169.0 (H) 10/03/2020   HDL 63.70 10/03/2020   LDLDIRECT 51.0 04/14/2019   LDLCALC 39 10/03/2020   ALT 41 (H) 10/03/2020   AST 29 10/03/2020   NA 140 10/03/2020   K 4.3 10/03/2020   CL 104 10/03/2020   CREATININE 1.21 (H) 10/03/2020   BUN 20 10/03/2020   CO2 29 10/03/2020   TSH 1.40 02/25/2020   INR 1.0 06/15/2019   HGBA1C 7.4 (H) 10/03/2020   MICROALBUR 32.8 (H) 02/25/2020    MM 3D SCREEN BREAST BILATERAL  Result Date: 09/16/2019 CLINICAL DATA:  Screening. EXAM: DIGITAL SCREENING BILATERAL MAMMOGRAM WITH TOMO AND CAD COMPARISON:  None. ACR Breast Density Category b: There are scattered areas of fibroglandular density. FINDINGS: There are no findings suspicious for malignancy. Images were processed with CAD. IMPRESSION: No mammographic evidence of malignancy. A result letter of this screening mammogram will be mailed directly to the patient. RECOMMENDATION: Screening mammogram in one year. (Code:SM-B-01Y) BI-RADS CATEGORY  1: Negative. Electronically Signed   By: Dorise Bullion III M.D   On: 09/16/2019 17:22       Assessment & Plan:   Problem List Items  Addressed This Visit    Hypertension    Taking imdur.  Blood pressure overall doing well.  Follow pressures.  Follow metabolic panel.       Hypercholesteremia    On crestor.  Low cholesterol diet and exercise.  Follow lipid panel and liver function tests.        Relevant Orders   Hepatic function panel (Completed)   Lipid panel (Completed)   Essential (primary) hypertension   Diabetes mellitus  ([place])    On metformin.  Low carb diet and exercise.  Follow met b and a1c.   Lab Results  Component Value Date   HGBA1C 7.4 (H) 10/03/2020        Relevant Orders   Hemoglobin A1c (Completed)   Basic metabolic panel (Completed)   CKD (chronic kidney disease) stage 3, GFR 30-59 ml/min (HCC)    Continue to avoid antiinflammatories.  Stay hydrated.  Follow metabolic panel.       Carotid arterial disease ([place])    Continue crestor and aspirin.  Followed by [facility]. s stable.       CAD (coronary artery disease)   Anemia    Followed by hematology.  Has received iron infusions.  Now on iron qod.  Follow.       Afib ([place])    On aspirin.  Followed by cardiology.  Reports no increased heart rate or palpitations.  Follow.         Other Visit Diagnoses    Visit for screening mammogram    -  Primary   Relevant Orders   MM 3D SCREEN BREAST BILATERAL   Need for immunization against influenza       Relevant Orders   Flu Vaccine QUAD High Dose(Fluad) (Completed)       Einar Pheasant, MD

## 2020-10-09 ENCOUNTER — Encounter: Payer: Self-pay | Admitting: Internal Medicine

## 2020-10-09 NOTE — Assessment & Plan Note (Signed)
On metformin.  Low carb diet and exercise.  Follow met b and a1c.   Lab Results  Component Value Date   HGBA1C 7.4 (H) 10/03/2020

## 2020-10-09 NOTE — Assessment & Plan Note (Signed)
Taking imdur.  Blood pressure overall doing well.  Follow pressures.  Follow metabolic panel.

## 2020-10-09 NOTE — Assessment & Plan Note (Signed)
On crestor.  Low cholesterol diet and exercise.  Follow lipid panel and liver function tests.   

## 2020-10-09 NOTE — Assessment & Plan Note (Signed)
On aspirin.  Followed by cardiology.  Reports no increased heart rate or palpitations.  Follow.

## 2020-10-09 NOTE — Assessment & Plan Note (Signed)
Continue to avoid antiinflammatories.  Stay hydrated.  Follow metabolic panel.  

## 2020-10-09 NOTE — Assessment & Plan Note (Signed)
Followed by hematology.  Has received iron infusions.  Now on iron qod.  Follow.

## 2020-10-09 NOTE — Assessment & Plan Note (Signed)
Continue crestor and aspirin.  Followed by [facility]. s stable.

## 2020-10-21 ENCOUNTER — Other Ambulatory Visit: Payer: Self-pay | Admitting: Internal Medicine

## 2020-10-24 ENCOUNTER — Encounter: Payer: Self-pay | Admitting: Internal Medicine

## 2020-11-14 ENCOUNTER — Other Ambulatory Visit: Payer: Self-pay | Admitting: Internal Medicine

## 2020-11-30 ENCOUNTER — Telehealth: Payer: Self-pay | Admitting: Internal Medicine

## 2020-11-30 NOTE — Telephone Encounter (Signed)
Patient is having cough, HA, chest congestion, has not had a fever. Patient states she can not lay down to sleep; has to stay sitting up. Sx started 2 weeks ago. She has been taking musinex DM and cough syrup. Patient agreed to be seen by telephone visit tomorrow at 12pm; okay per [place]. I can not schedule her as I can not override the schedule.

## 2020-11-30 NOTE — Telephone Encounter (Signed)
Pt called wanting to have something called in for a sinus infection she didn't want to schedule an appt  Pt stated that her great- grandson is sick with a cold  Pt hasnt been tested for covid

## 2020-11-30 NOTE — Telephone Encounter (Signed)
See attached.  Pt to be evaluated per note 12/01/20 (Thursday 12:00).

## 2020-12-01 ENCOUNTER — Encounter: Payer: Self-pay | Admitting: Internal Medicine

## 2020-12-01 ENCOUNTER — Telehealth (INDEPENDENT_AMBULATORY_CARE_PROVIDER_SITE_OTHER): Payer: Medicare Other | Admitting: Internal Medicine

## 2020-12-01 DIAGNOSIS — E1159 Type 2 diabetes mellitus with other circulatory complications: Secondary | ICD-10-CM | POA: Diagnosis not present

## 2020-12-01 DIAGNOSIS — R059 Cough, unspecified: Secondary | ICD-10-CM | POA: Diagnosis not present

## 2020-12-01 MED ORDER — PREDNISONE 10 MG PO TABS: ORAL_TABLET | ORAL | 0 refills | Status: DC

## 2020-12-01 MED ORDER — CEFDINIR 300 MG PO CAPS: 300.0000 mg | ORAL_CAPSULE | Freq: Two times a day (BID) | ORAL | 0 refills | Status: DC

## 2020-12-01 MED ORDER — TRIAMCINOLONE ACETONIDE 55 MCG/ACT NA AERO: 2.0000 | INHALATION_SPRAY | Freq: Every day | NASAL | 0 refills | Status: DC

## 2020-12-01 NOTE — Telephone Encounter (Signed)
Pt is scheduled for telephone visit today 12/01/20 @ 12pm.

## 2020-12-01 NOTE — Progress Notes (Signed)
Patient ID: Jocelyn Elliott, female   DOB: [birth year], 87 y.o.   MRN: [id number]   Virtual Visit via telephone Note  This visit type was conducted due to national recommendations for restrictions regarding the COVID-19 pandemic (e.g. social distancing).  This format is felt to be most appropriate for this patient at this time.  All issues noted in this document were discussed and addressed.  No physical exam was performed (except for noted visual exam findings with Video Visits).   I connected with Jocelyn Elliott by telephone and verified that I am speaking with the correct person using two identifiers. Location patient: home Location provider: work  Persons participating in the telephone visit: patient, provider  The limitations, risks, security and privacy concerns of performing an evaluation and management service by telephone and the availability of in person appointments have been discussed.  It has also been discussed with the patient that there may be a patient responsible charge related to this service. The patient expressed understanding and agreed to proceed.   Reason for visit: work in appt  HPI: Reports 11/06/20 - stomach virus.  One week.  On 11/22/20 - developed nasal congestion - light mucus.  No sore throat.  Increased sinus congestion.  Taking mucinex.  Headache from cough.  Cough persistent - increased.  Worse at night.  No fever.  No chest pain.  Denies sob.  Eating.  No nausea or vomiting.  Some constipation.  Last bowel movement - 2 days ago.  Blood sugar doing well.  She and her daughter keep her great grandson daily.  No known covid exposure.    ROS: See pertinent positives and negatives per HPI.  Past Medical History:  Diagnosis Date  . Arthritis   . AV malformation of gastrointestinal tract   . Blood in stool   . CAD (coronary artery disease)   . Carotid arterial disease ([place])   . Cataracts, bilateral   . Chronic blood loss anemia   . Chronic cystitis   .  Complication of anesthesia    reaction to propofol  . Depression   . Diabetes mellitus ([place])   . GERD (gastroesophageal reflux disease)   . Hyperlipidemia   . Hypertension   . IDA (iron deficiency anemia)   . Stress incontinence     Past Surgical History:  Procedure Laterality Date  . ABDOMINAL HYSTERECTOMY  1972   ovaries left in place  . APPENDECTOMY  1992  . BACK SURGERY  06/08/2010   L3, L4, L5  . CAROTID ARTERY ANGIOPLASTY  11/03  . [place], 92   right then left   . CHOLECYSTECTOMY  1993  . COLONOSCOPY WITH PROPOFOL N/A 02/02/2019   Procedure: COLONOSCOPY WITH PROPOFOL;  Surgeon: Manya Silvas, MD;  Location: [facility] ENDOSCOPY;  Service: Endoscopy;  Laterality: N/A;  . ESOPHAGOGASTRODUODENOSCOPY N/A 02/02/2019   Procedure: ESOPHAGOGASTRODUODENOSCOPY (EGD);  Surgeon: Manya Silvas, MD;  Location: [facility] ENDOSCOPY;  Service: Endoscopy;  Laterality: N/A;  . FOOT SURGERY  06/2007   Left  . right carotid artery surgery  01/09/13   Dr. Delana Meyer @ [facility]  . TOTAL HIP ARTHROPLASTY  05/03/2011   left 12, right 11/07    Family History  Problem Relation Age of Onset  . Hyperlipidemia Father   . Heart disease Father        myocardial infarction  . Hypertension Father        Parent  . Arthritis Other  parent  . Diabetes Other        nephew  . Cervical cancer Sister   . Rectal cancer Sister   . Breast cancer Neg Hx     SOCIAL HX: reviewed.    Current Outpatient Medications:  .  aspirin 81 MG tablet, Take 81 mg by mouth daily., Disp: , Rfl:  .  Biotin 5000 MCG CAPS, Take 1 capsule by mouth daily., Disp: , Rfl:  .  blood glucose meter kit and supplies KIT, Dispense based on insurance preference. Use daily to check sugars once daily. Dx e11.9, Disp: 1 each, Rfl: 0 .  cetirizine (ZYRTEC) 10 MG tablet, Take 10 mg by mouth daily., Disp: , Rfl:  .  ferrous sulfate 325 (65 FE) MG EC tablet, Take 325 mg by mouth daily with breakfast. Every other day,  Disp: , Rfl:  .  isosorbide mononitrate (IMDUR) 30 MG 24 hr tablet, Take 30 mg by mouth daily. , Disp: , Rfl:  .  Lancets (ONETOUCH ULTRASOFT) lancets, Use as instructed to check blood sugars once daily. DX e 11.9, Disp: 100 each, Rfl: 12 .  metFORMIN (GLUCOPHAGE) 500 MG tablet, TAKE 1 TABLET BY MOUTH EVERY DAY WITH BREAKFAST, Disp: 90 tablet, Rfl: 1 .  Omega-3 Fatty Acids (FISH OIL) 1200 MG CAPS, Take 1 capsule by mouth daily., Disp: , Rfl:  .  ONETOUCH ULTRA test strip, USE AS INSTRUCTED TO CHECK BLOOD SUGARS ONCE DAILY. DX E11.9, Disp: 100 strip, Rfl: 12 .  rosuvastatin (CRESTOR) 40 MG tablet, TAKE 1 TABLET BY MOUTH EVERY DAY, Disp: 30 tablet, Rfl: 0 .  traZODone (DESYREL) 50 MG tablet, Take 0.5-1 tablets (25-50 mg total) by mouth at bedtime as needed for sleep., Disp: 90 tablet, Rfl: 1 .  cefdinir (OMNICEF) 300 MG capsule, Take 1 capsule (300 mg total) by mouth 2 (two) times daily., Disp: 20 capsule, Rfl: 0 .  predniSONE (DELTASONE) 10 MG tablet, Take 4 tablets x 1 day and then decrease by 1/2 tablet per day until down to zero mg., Disp: 18 tablet, Rfl: 0 .  triamcinolone (NASACORT) 55 MCG/ACT AERO nasal inhaler, Place 2 sprays into the nose daily., Disp: 1 each, Rfl: 0  EXAM:  GENERAL: alert. Sound to be in no acute distress.  Answering questions appropriately.   PSYCH/NEURO: pleasant and cooperative, no obvious depression or anxiety, speech and thought processing grossly intact  ASSESSMENT AND PLAN:  Discussed the following assessment and plan:  Problem List Items Addressed This Visit    Cough    Increased nasal congestion, cough and chest congestion.  Saline nasal spray and steroid nasal spray as directed.  Continue mucinex.  Treat with omnicef and prednisone taper as directed.  Follow closely.  Call with update.  Declines covid testing.  Follow.        Diabetes mellitus ([place])    Sugars doing well.  Discussed possible elevation of sugar with prednisone.  Follow.            I  discussed the assessment and treatment plan with the patient. The patient was provided an opportunity to ask questions and all were answered. The patient agreed with the plan and demonstrated an understanding of the instructions.   The patient was advised to call back or seek an in-person evaluation if the symptoms worsen or if the condition fails to improve as anticipated.  I provided 24 minutes of non-face-to-face time during this encounter.   Einar Pheasant, MD

## 2020-12-01 NOTE — Telephone Encounter (Signed)
Noted  

## 2020-12-02 ENCOUNTER — Telehealth: Payer: Self-pay | Admitting: Internal Medicine

## 2020-12-02 ENCOUNTER — Other Ambulatory Visit: Payer: Self-pay

## 2020-12-02 MED ORDER — PREDNISONE 10 MG PO TABS: ORAL_TABLET | ORAL | 0 refills | Status: DC

## 2020-12-02 MED ORDER — TRIAMCINOLONE ACETONIDE 55 MCG/ACT NA AERO: 2.0000 | INHALATION_SPRAY | Freq: Every day | NASAL | 0 refills | Status: DC

## 2020-12-02 MED ORDER — CEFDINIR 300 MG PO CAPS: 300.0000 mg | ORAL_CAPSULE | Freq: Two times a day (BID) | ORAL | 0 refills | Status: DC

## 2020-12-02 NOTE — Telephone Encounter (Signed)
Patient called to check on her prescription she stated that the [facility] did not have it at 7pm last night

## 2020-12-02 NOTE — Telephone Encounter (Signed)
12/02/2020 Called pt to inform her that appts have been moved out to 12/26/20 per Dr. B due to increase in [place] cases. Pt confirmed this change  SRW

## 2020-12-02 NOTE — Telephone Encounter (Signed)
Pt called back and said she has to have someone pick up medication. Fransisco Beau resubmitted prescriptions because they failed yesterday. Pt was notified.

## 2020-12-02 NOTE — Telephone Encounter (Signed)
Patient is ok just wanted her rx resent. I have resent medication due to failure.

## 2020-12-02 NOTE — Telephone Encounter (Signed)
Which prescription is she talking about.  It looks like rx sent in.  Also, make sure she is doing ok.

## 2020-12-02 NOTE — Telephone Encounter (Signed)
FYI

## 2020-12-06 ENCOUNTER — Inpatient Hospital Stay: Payer: Medicare Other | Admitting: Internal Medicine

## 2020-12-06 ENCOUNTER — Other Ambulatory Visit: Payer: Medicare Other

## 2020-12-06 ENCOUNTER — Inpatient Hospital Stay: Payer: Medicare Other

## 2020-12-10 ENCOUNTER — Encounter: Payer: Self-pay | Admitting: Internal Medicine

## 2020-12-10 DIAGNOSIS — R059 Cough, unspecified: Secondary | ICD-10-CM | POA: Insufficient documentation

## 2020-12-10 NOTE — Assessment & Plan Note (Signed)
Increased nasal congestion, cough and chest congestion.  Saline nasal spray and steroid nasal spray as directed.  Continue mucinex.  Treat with omnicef and prednisone taper as directed.  Follow closely.  Call with update.  Declines covid testing.  Follow.

## 2020-12-10 NOTE — Assessment & Plan Note (Signed)
Sugars doing well.  Discussed possible elevation of sugar with prednisone.  Follow.

## 2020-12-16 ENCOUNTER — Other Ambulatory Visit: Payer: Self-pay | Admitting: Internal Medicine

## 2020-12-25 ENCOUNTER — Other Ambulatory Visit: Payer: Self-pay | Admitting: Internal Medicine

## 2020-12-26 ENCOUNTER — Encounter: Payer: Self-pay | Admitting: Internal Medicine

## 2020-12-26 ENCOUNTER — Inpatient Hospital Stay: Payer: Medicare Other | Attending: Internal Medicine

## 2020-12-26 ENCOUNTER — Other Ambulatory Visit: Payer: Self-pay

## 2020-12-26 ENCOUNTER — Inpatient Hospital Stay (HOSPITAL_BASED_OUTPATIENT_CLINIC_OR_DEPARTMENT_OTHER): Payer: Medicare Other | Admitting: Internal Medicine

## 2020-12-26 ENCOUNTER — Inpatient Hospital Stay: Payer: Medicare Other

## 2020-12-26 VITALS — BP 130/63 | HR 77 | Resp 20

## 2020-12-26 DIAGNOSIS — N183 Chronic kidney disease, stage 3 unspecified: Secondary | ICD-10-CM

## 2020-12-26 DIAGNOSIS — D508 Other iron deficiency anemias: Secondary | ICD-10-CM

## 2020-12-26 DIAGNOSIS — D631 Anemia in chronic kidney disease: Secondary | ICD-10-CM

## 2020-12-26 LAB — CBC WITH DIFFERENTIAL/PLATELET
Abs Immature Granulocytes: 0.02 10*3/uL (ref 0.00–0.07)
Basophils Absolute: 0 10*3/uL (ref 0.0–0.1)
Basophils Relative: 1 %
Eosinophils Absolute: 0.3 10*3/uL (ref 0.0–0.5)
Eosinophils Relative: 4 %
HCT: 35.7 % — ABNORMAL LOW (ref 36.0–46.0)
Hemoglobin: 12.1 g/dL (ref 12.0–15.0)
Immature Granulocytes: 0 %
Lymphocytes Relative: 20 %
Lymphs Abs: 1.4 10*3/uL (ref 0.7–4.0)
MCH: 32.1 pg (ref 26.0–34.0)
MCHC: 33.9 g/dL (ref 30.0–36.0)
MCV: 94.7 fL (ref 80.0–100.0)
Monocytes Absolute: 0.4 10*3/uL (ref 0.1–1.0)
Monocytes Relative: 5 %
Neutro Abs: 4.9 10*3/uL (ref 1.7–7.7)
Neutrophils Relative %: 70 %
Platelets: 195 10*3/uL (ref 150–400)
RBC: 3.77 MIL/uL — ABNORMAL LOW (ref 3.87–5.11)
RDW: 13.9 % (ref 11.5–15.5)
WBC: 7 10*3/uL (ref 4.0–10.5)
nRBC: 0 % (ref 0.0–0.2)

## 2020-12-26 LAB — BASIC METABOLIC PANEL
Anion gap: 8 (ref 5–15)
BUN: 20 mg/dL (ref 8–23)
CO2: 24 mmol/L (ref 22–32)
Calcium: 8.9 mg/dL (ref 8.9–10.3)
Chloride: 108 mmol/L (ref 98–111)
Creatinine, Ser: 1.57 mg/dL — ABNORMAL HIGH (ref 0.44–1.00)
GFR, Estimated: 32 mL/min — ABNORMAL LOW (ref >60)
Glucose, Bld: 112 mg/dL — ABNORMAL HIGH (ref 70–99)
Potassium: 3.4 mmol/L — ABNORMAL LOW (ref 3.5–5.1)
Sodium: 140 mmol/L (ref 135–145)

## 2020-12-26 LAB — IRON AND TIBC
Iron: 84 ug/dL (ref 28–170)
Saturation Ratios: 29 % (ref 10.4–31.8)
TIBC: 286 ug/dL (ref 250–450)
UIBC: 202 ug/dL

## 2020-12-26 LAB — FERRITIN: Ferritin: 76 ng/mL (ref 11–307)

## 2020-12-26 MED ORDER — SODIUM CHLORIDE 0.9 % IV SOLN
Freq: Once | INTRAVENOUS | Status: AC
Start: 2020-12-26 — End: 2020-12-26
  Filled 2020-12-26: qty 250

## 2020-12-26 MED ORDER — FERUMOXYTOL INJECTION 510 MG/17 ML
510.0000 mg | Freq: Once | INTRAVENOUS | Status: AC
  Administered 2020-12-26: 510 mg via INTRAVENOUS
  Filled 2020-12-26: qty 510

## 2020-12-26 NOTE — Progress Notes (Signed)
1447- Patient tolerated treatment well. Patient and vital signs stable. Patient discharged to home at this time.

## 2020-12-26 NOTE — Assessment & Plan Note (Addendum)
#  Severe anemia hemoglobin-nadir 6 [summer 2020]. Likely CKD [ Bone marrow biopsy-mild dyserythropoietic changes].   #Hemoglobin today is  12.2; proceed with IV Feraheme today;  Continue PO iron/Vitron-C every other day AUG 021- Iron studies: Saturation 13; ferritin 17; awaiting Iron studies/ferritin- today  # CKD stage III-1.5- GFR- 32; STABLE; but slightly worse.  Recommend continued p.o. intake.    # DISPOSITION:  # Ferrahem today.  # in 3 months- MD- labs- cbc/bmp;possible Ferrahem-/ Dr.B

## 2020-12-26 NOTE — Progress Notes (Signed)
[place] OFFICE PROGRESS NOTE  Patient Care Team: Einar Pheasant, MD as PCP - General (Internal Medicine) Einar Pheasant, MD (Internal Medicine) Isaias Cowman, MD (Internal Medicine) Albesa Seen, MD (Unknown Physician Specialty) Brendolyn Patty, MD (Specialist) Schnier, Dolores Lory, MD (Vascular Surgery) Leia Alf, MD (Inactive) as Referring Physician (Internal Medicine) Isaias Cowman, MD (Internal Medicine) Albesa Seen, MD (Unknown Physician Specialty) Philis Kendall, MD (Unknown Physician Specialty) Brendolyn Patty, MD (Specialist) Schnier, Dolores Lory, MD (Vascular Surgery)   SUMMARY OF HEMATOLOGIC HISTORY:  # IRON DEFICIENCY ANEMIA- recurrent [? GI blood loss; Dr.Elliot; AVM-capsule]; IV Ferrahem q 47m last colo- 2012/march; EGD-- [id number]  July 2020 bone marrow biopsy-mild dysplastic changes; 40% hypercellularity; cytogenetics-normal.-Retacrit.  #Colonoscopy-April 2020 aborted because of cardiac pause.  # CKD stage III-IV; CAD-Dr. PSaralyn Pilar INTERVAL HISTORY:   87-year-old female patient with above history of recurrent iron deficiency anemia unclear etiology -related to CKD is here for follow-up.  Patient had a recent URI-which she thinks contracted from her grandson.  Had diarrhea/abdominal crampy discomfort resolved.  Negative for Covid.  She has recovered fairly well.  Admits to poor p.o. intake.  No blood in stools no blood in the stools.   Review of Systems  Constitutional: Positive for malaise/fatigue. Negative for chills, diaphoresis, fever and weight loss.  HENT: Negative for nosebleeds and sore throat.   Eyes: Negative for double vision.  Respiratory: Negative for cough, hemoptysis, sputum production and wheezing.   Cardiovascular: Negative for palpitations, orthopnea and leg swelling.  Gastrointestinal: Negative for abdominal pain, blood in stool, constipation, diarrhea, heartburn, melena, nausea and vomiting.   Genitourinary: Negative for dysuria, frequency and urgency.  Musculoskeletal: Negative for back pain and joint pain.  Skin: Negative.  Negative for itching and rash.  Neurological: Negative for dizziness, tingling, focal weakness, weakness and headaches.  Endo/Heme/Allergies: Does not bruise/bleed easily.  Psychiatric/Behavioral: Negative for depression. The patient is not nervous/anxious and does not have insomnia.      PAST MEDICAL HISTORY :  Past Medical History:  Diagnosis Date  . Arthritis   . AV malformation of gastrointestinal tract   . Blood in stool   . CAD (coronary artery disease)   . Carotid arterial disease ([place]   . Cataracts, bilateral   . Chronic blood loss anemia   . Chronic cystitis   . Complication of anesthesia    reaction to propofol  . Depression   . Diabetes mellitus ([place]   . GERD (gastroesophageal reflux disease)   . Hyperlipidemia   . Hypertension   . IDA (iron deficiency anemia)   . Stress incontinence     PAST SURGICAL HISTORY :   Past Surgical History:  Procedure Laterality Date  . ABDOMINAL HYSTERECTOMY  1972   ovaries left in place  . APPENDECTOMY  1992  . BACK SURGERY  06/08/2010   L3, L4, L5  . CAROTID ARTERY ANGIOPLASTY  11/03  . [place]   right then left   . CHOLECYSTECTOMY  1993  . COLONOSCOPY WITH PROPOFOL N/A 02/02/2019   Procedure: COLONOSCOPY WITH PROPOFOL;  Surgeon: EManya Silvas MD;  [facility];  Service: Endoscopy;  Laterality: N/A;  . ESOPHAGOGASTRODUODENOSCOPY N/A 02/02/2019   Procedure: ESOPHAGOGASTRODUODENOSCOPY (EGD);  Surgeon: EManya Silvas MD;  [facility];  Service: Endoscopy;  Laterality: N/A;  . FOOT SURGERY  06/2007   Left  . right carotid artery surgery  01/09/13   Dr. SDelana Meyer@ [facility]  . TOTAL HIP ARTHROPLASTY  05/03/2011   left 12, right 11/07    FAMILY HISTORY :   Family History  Problem Relation Age of Onset  . Hyperlipidemia Father   . Heart  disease Father        myocardial infarction  . Hypertension Father        Parent  . Arthritis Other        parent  . Diabetes Other        nephew  . Cervical cancer Sister   . Rectal cancer Sister   . Breast cancer Neg Hx     SOCIAL HISTORY:   Social History   Tobacco Use  . Smoking status: Former Smoker    Types: Cigarettes    Quit date: 11/19/1989    Years since quitting: 31.1  . Smokeless tobacco: Never Used  Substance Use Topics  . Alcohol use: No    Alcohol/week: 0.0 standard drinks  . Drug use: No    ALLERGIES:  is allergic to ciprofloxacin, levaquin [levofloxacin], propofol, and tequin [gatifloxacin].  MEDICATIONS:  Current Outpatient Medications  Medication Sig Dispense Refill  . aspirin 81 MG tablet Take 81 mg by mouth daily.    . Biotin 5000 MCG CAPS Take 1 capsule by mouth daily.    . blood glucose meter kit and supplies KIT Dispense based on insurance preference. Use daily to check sugars once daily. Dx e11.9 1 each 0  . cetirizine (ZYRTEC) 10 MG tablet Take 10 mg by mouth daily.    . ferrous sulfate 325 (65 FE) MG EC tablet Take 325 mg by mouth daily with breakfast. Every other day    . isosorbide mononitrate (IMDUR) 30 MG 24 hr tablet Take 30 mg by mouth daily.     . Lancets (ONETOUCH ULTRASOFT) lancets Use as instructed to check blood sugars once daily. DX e 11.9 100 each 12  . metFORMIN (GLUCOPHAGE) 500 MG tablet TAKE 1 TABLET BY MOUTH EVERY DAY WITH BREAKFAST 90 tablet 1  . Omega-3 Fatty Acids (FISH OIL) 1200 MG CAPS Take 1 capsule by mouth daily.    Glory Rosebush ULTRA test strip USE AS INSTRUCTED TO CHECK BLOOD SUGARS ONCE DAILY. DX E11.9 100 strip 12  . rosuvastatin (CRESTOR) 40 MG tablet TAKE 1 TABLET BY MOUTH EVERY DAY 30 tablet 0  . traZODone (DESYREL) 50 MG tablet Take 0.5-1 tablets (25-50 mg total) by mouth at bedtime as needed for sleep. 90 tablet 1   No current facility-administered medications for this visit.   Facility-Administered Medications  Ordered in Other Visits  Medication Dose Route Frequency Provider Last Rate Last Admin  . ferumoxytol (FERAHEME) 510 mg in sodium chloride 0.9 % 100 mL IVPB  510 mg Intravenous Once Cammie Sickle, MD        PHYSICAL EXAMINATION:   BP 101/63   Pulse 75   Temp 97.8 F (36.6 C) (Tympanic)   Resp 18   Ht 4' 11"  (1.499 m)   Wt 138 lb (62.6 kg)   BMI 27.87 kg/m   Filed Weights   12/26/20 1315  Weight: 138 lb (62.6 kg)    Physical Exam Constitutional:      Comments: [place] Caucasian female patient.  She is in a wheelchair.  HENT:     Head: Normocephalic and atraumatic.     Mouth/Throat:     Pharynx: No oropharyngeal exudate.  Eyes:     Pupils: Pupils are equal, round, and reactive to light.     Comments: Positive for pallor.  Cardiovascular:     Rate and Rhythm: Normal rate and regular rhythm.  Pulmonary:     Effort: No respiratory distress.     Breath sounds: No wheezing.  Abdominal:     General: Bowel sounds are normal. There is no distension.     Palpations: Abdomen is soft. There is no mass.     Tenderness: There is no abdominal tenderness. There is no guarding or rebound.  Musculoskeletal:        General: No tenderness. Normal range of motion.     Cervical back: Normal range of motion and neck supple.  Skin:    General: Skin is warm.  Neurological:     Mental Status: She is alert and oriented to person, place, and time.  Psychiatric:        Mood and Affect: Affect normal.     LABORATORY DATA:  I have reviewed the data as listed    Component Value Date/Time   NA 140 12/26/2020 1300   NA 143 01/10/2013 0314   K 3.4 (L) 12/26/2020 1300   K 4.0 01/10/2013 0314   CL 108 12/26/2020 1300   CL 109 (H) 01/10/2013 0314   CO2 24 12/26/2020 1300   CO2 27 01/10/2013 0314   GLUCOSE 112 (H) 12/26/2020 1300   GLUCOSE 119 (H) 01/10/2013 0314   BUN 20 12/26/2020 1300   BUN 15 01/10/2013 0314   CREATININE 1.57 (H) 12/26/2020 1300   CREATININE 1.03  01/10/2013 0314   CREATININE 1.13 (H) 09/18/2012 0848   CALCIUM 8.9 12/26/2020 1300   CALCIUM 8.1 (L) 01/10/2013 0314   PROT 6.8 10/03/2020 1129   PROT 7.2 12/20/2011 1521   ALBUMIN 4.1 10/03/2020 1129   ALBUMIN 3.9 12/20/2011 1521   AST 29 10/03/2020 1129   AST 31 12/20/2011 1521   ALT 41 (H) 10/03/2020 1129   ALT 37 12/20/2011 1521   ALKPHOS 55 10/03/2020 1129   ALKPHOS 57 12/20/2011 1521   BILITOT 0.6 10/03/2020 1129   BILITOT 0.5 12/20/2011 1521   GFRNONAA 32 (L) 12/26/2020 1300   GFRNONAA 52 (L) 01/10/2013 0314   GFRNONAA 47 (L) 09/18/2012 0848   GFRAA 46 (L) 07/12/2020 1240   GFRAA >60 01/10/2013 0314   GFRAA 54 (L) 09/18/2012 0848    No results found for: SPEP, UPEP  Lab Results  Component Value Date   WBC 7.0 12/26/2020   NEUTROABS 4.9 12/26/2020   HGB 12.1 12/26/2020   HCT 35.7 (L) 12/26/2020   MCV 94.7 12/26/2020   PLT 195 12/26/2020      Chemistry      Component Value Date/Time   NA 140 12/26/2020 1300   NA 143 01/10/2013 0314   K 3.4 (L) 12/26/2020 1300   K 4.0 01/10/2013 0314   CL 108 12/26/2020 1300   CL 109 (H) 01/10/2013 0314   CO2 24 12/26/2020 1300   CO2 27 01/10/2013 0314   BUN 20 12/26/2020 1300   BUN 15 01/10/2013 0314   CREATININE 1.57 (H) 12/26/2020 1300   CREATININE 1.03 01/10/2013 0314   CREATININE 1.13 (H) 09/18/2012 0848      Component Value Date/Time   CALCIUM 8.9 12/26/2020 1300   CALCIUM 8.1 (L) 01/10/2013 0314   ALKPHOS 55 10/03/2020 1129   ALKPHOS 57 12/20/2011 1521   AST 29 10/03/2020 1129   AST 31 12/20/2011 1521   ALT 41 (H) 10/03/2020 1129   ALT 37 12/20/2011 1521   BILITOT 0.6 10/03/2020 1129   BILITOT 0.5 12/20/2011  [place]:   Anemia of chronic kidney failure, stage 3 (moderate) (HCC) #Severe anemia hemoglobin-nadir 6 [summer 2020]. Likely CKD [ Bone marrow biopsy-mild dyserythropoietic changes].   #Hemoglobin today is  12.2; proceed with IV Feraheme today;  Continue PO  iron/Vitron-C every other day AUG 021- Iron studies: Saturation 13; ferritin 17; awaiting Iron studies/ferritin- today  # CKD stage III-1.5- GFR- 32; STABLE; but slightly worse.  Recommend continued p.o. intake.    # DISPOSITION:  # Ferrahem today.  # in 3 months- MD- labs- cbc/bmp;possible Ferrahem-/ Dr.B       Cammie Sickle, MD 12/26/2020 1:48 PM

## 2021-01-02 DIAGNOSIS — R609 Edema, unspecified: Secondary | ICD-10-CM | POA: Insufficient documentation

## 2021-01-02 LAB — HM DIABETES EYE EXAM

## 2021-01-12 ENCOUNTER — Other Ambulatory Visit: Payer: Self-pay | Admitting: Internal Medicine

## 2021-01-28 ENCOUNTER — Other Ambulatory Visit: Payer: Self-pay | Admitting: Internal Medicine

## 2021-02-06 ENCOUNTER — Ambulatory Visit: Payer: Medicare Other

## 2021-02-07 ENCOUNTER — Ambulatory Visit: Payer: Medicare Other | Admitting: Internal Medicine

## 2021-02-07 ENCOUNTER — Other Ambulatory Visit: Payer: Self-pay

## 2021-02-07 ENCOUNTER — Encounter: Payer: Self-pay | Admitting: Internal Medicine

## 2021-02-07 VITALS — BP 142/68 | HR 87 | Temp 97.8°F | Ht 59.0 in | Wt 134.2 lb

## 2021-02-07 DIAGNOSIS — D631 Anemia in chronic kidney disease: Secondary | ICD-10-CM | POA: Diagnosis not present

## 2021-02-07 DIAGNOSIS — I2510 Atherosclerotic heart disease of native coronary artery without angina pectoris: Secondary | ICD-10-CM

## 2021-02-07 DIAGNOSIS — D649 Anemia, unspecified: Secondary | ICD-10-CM

## 2021-02-07 DIAGNOSIS — M25559 Pain in unspecified hip: Secondary | ICD-10-CM

## 2021-02-07 DIAGNOSIS — I4891 Unspecified atrial fibrillation: Secondary | ICD-10-CM | POA: Diagnosis not present

## 2021-02-07 DIAGNOSIS — E1159 Type 2 diabetes mellitus with other circulatory complications: Secondary | ICD-10-CM

## 2021-02-07 DIAGNOSIS — R479 Unspecified speech disturbances: Secondary | ICD-10-CM

## 2021-02-07 DIAGNOSIS — I10 Essential (primary) hypertension: Secondary | ICD-10-CM | POA: Diagnosis not present

## 2021-02-07 DIAGNOSIS — E7800 Pure hypercholesterolemia, unspecified: Secondary | ICD-10-CM | POA: Diagnosis not present

## 2021-02-07 DIAGNOSIS — N183 Chronic kidney disease, stage 3 unspecified: Secondary | ICD-10-CM

## 2021-02-07 DIAGNOSIS — I779 Disorder of arteries and arterioles, unspecified: Secondary | ICD-10-CM

## 2021-02-07 LAB — HEPATIC FUNCTION PANEL
ALT: 29 U/L (ref 0–35)
AST: 29 U/L (ref 0–37)
Albumin: 4 g/dL (ref 3.5–5.2)
Alkaline Phosphatase: 55 U/L (ref 39–117)
Bilirubin, Direct: 0.1 mg/dL (ref 0.0–0.3)
Total Bilirubin: 0.4 mg/dL (ref 0.2–1.2)
Total Protein: 6.3 g/dL (ref 6.0–8.3)

## 2021-02-07 LAB — BASIC METABOLIC PANEL
BUN: 22 mg/dL (ref 6–23)
CO2: 25 mEq/L (ref 19–32)
Calcium: 9.6 mg/dL (ref 8.4–10.5)
Chloride: 108 mEq/L (ref 96–112)
Creatinine, Ser: 1.34 mg/dL — ABNORMAL HIGH (ref 0.40–1.20)
GFR: 35.87 mL/min — ABNORMAL LOW (ref >60.00)
Glucose, Bld: 99 mg/dL (ref 70–99)
Potassium: 3.8 mEq/L (ref 3.5–5.1)
Sodium: 142 mEq/L (ref 135–145)

## 2021-02-07 LAB — TSH: TSH: 1.97 u[IU]/mL (ref 0.35–4.50)

## 2021-02-07 LAB — CBC WITH DIFFERENTIAL/PLATELET
Basophils Absolute: 0.1 10*3/uL (ref 0.0–0.1)
Basophils Relative: 0.9 % (ref 0.0–3.0)
Eosinophils Absolute: 0.2 10*3/uL (ref 0.0–0.7)
Eosinophils Relative: 2.7 % (ref 0.0–5.0)
HCT: 34.4 % — ABNORMAL LOW (ref 36.0–46.0)
Hemoglobin: 11.6 g/dL — ABNORMAL LOW (ref 12.0–15.0)
Lymphocytes Relative: 21.4 % (ref 12.0–46.0)
Lymphs Abs: 1.4 10*3/uL (ref 0.7–4.0)
MCHC: 33.8 g/dL (ref 30.0–36.0)
MCV: 98 fl (ref 78.0–100.0)
Monocytes Absolute: 0.4 10*3/uL (ref 0.1–1.0)
Monocytes Relative: 5.6 % (ref 3.0–12.0)
Neutro Abs: 4.6 10*3/uL (ref 1.4–7.7)
Neutrophils Relative %: 69.4 % (ref 43.0–77.0)
Platelets: 148 10*3/uL — ABNORMAL LOW (ref 150.0–400.0)
RBC: 3.51 Mil/uL — ABNORMAL LOW (ref 3.87–5.11)
RDW: 14.8 % (ref 11.5–15.5)
WBC: 6.7 10*3/uL (ref 4.0–10.5)

## 2021-02-07 LAB — LIPID PANEL
Cholesterol: 140 mg/dL (ref 0–200)
HDL: 67.2 mg/dL (ref >39.00)
LDL Cholesterol: 50 mg/dL (ref 0–99)
NonHDL: 72.62
Total CHOL/HDL Ratio: 2
Triglycerides: 111 mg/dL (ref 0.0–149.0)
VLDL: 22.2 mg/dL (ref 0.0–40.0)

## 2021-02-07 LAB — HEMOGLOBIN A1C: Hgb A1c MFr Bld: 6.4 % (ref 4.6–6.5)

## 2021-02-07 MED ORDER — CLOPIDOGREL BISULFATE 75 MG PO TABS: 75.0000 mg | ORAL_TABLET | Freq: Every day | ORAL | 0 refills | Status: DC

## 2021-02-07 NOTE — Progress Notes (Addendum)
Patient ID: KRYSTEENA STALKER, female   DOB: 10-12-1932, 87 y.o.   MRN: [id number]   Subjective:    Patient ID: KARIANN WECKER, female    DOB: [birth year], 87 y.o.   MRN: [id number]  HPI This visit occurred during the SARS-CoV-2 public health emergency.  Safety protocols were in place, including screening questions prior to the visit, additional usage of staff PPE, and extensive cleaning of exam room while observing appropriate contact time as indicated for disinfecting solutions.  Patient here for a scheduled follow up. She is accompanied by her daughter.  History obtained from both of them.  She reports that she noticed when she woke up Sunday that it took her longer to get dressed.  No headache. No light headedness.  States she was not talking as plain.  Described "muffled".  Her walking felt off, but she thought this was related to her right hip. Her daughter took her to church.  Had church in the car.  Daughter states that the only thing she noticed was that she responded slower.  No facial drooping.  Could move all extremities.  She did have problems writing.  States she feels back to normal today.  Taking aspirin.  Eating. No swallowing problems.  No nausea or vomiting.  Bowels moving.  No blood.  History of IDA.     Past Medical History:  Diagnosis Date  . Arthritis   . AV malformation of gastrointestinal tract   . Blood in stool   . CAD (coronary artery disease)   . Carotid arterial disease ([place])   . Cataracts, bilateral   . Chronic blood loss anemia   . Chronic cystitis   . Complication of anesthesia    reaction to propofol  . Depression   . Diabetes mellitus ([place])   . GERD (gastroesophageal reflux disease)   . Hyperlipidemia   . Hypertension   . IDA (iron deficiency anemia)   . Stress incontinence    Past Surgical History:  Procedure Laterality Date  . ABDOMINAL HYSTERECTOMY  1972   ovaries left in place  . APPENDECTOMY  1992  . BACK SURGERY  06/08/2010   L3, L4, L5  .  CAROTID ARTERY ANGIOPLASTY  11/03  . [place], 92   right then left   . CHOLECYSTECTOMY  1993  . COLONOSCOPY WITH PROPOFOL N/A 02/02/2019   Procedure: COLONOSCOPY WITH PROPOFOL;  Surgeon: Manya Silvas, MD;  Location[facility];  Service: Endoscopy;  Laterality: N/A;  . ESOPHAGOGASTRODUODENOSCOPY N/A 02/02/2019   Procedure: ESOPHAGOGASTRODUODENOSCOPY (EGD);  Surgeon: Manya Silvas, MD;  Location: [facility]COPY;  Service: Endoscopy;  Laterality: N/A;  . FOOT SURGERY  06/2007   Left  . right carotid artery surgery  01/09/13   Dr. Delana Meyer @ [facility]  . TOTAL HIP ARTHROPLASTY  05/03/2011   left 12, right 11/07   Family History  Problem Relation Age of Onset  . Hyperlipidemia Father   . Heart disease Father        myocardial infarction  . Hypertension Father        Parent  . Arthritis Other        parent  . Diabetes Other        nephew  . Cervical cancer Sister   . Rectal cancer Sister   . Breast cancer Neg Hx    Social History   Socioeconomic History  . Marital status: Widowed    Spouse name: Not on file  . Number of children:  1  . Years of education: 36  . Highest education level: Not on file  Occupational History  . Occupation: Retired  Tobacco Use  . Smoking status: Former Smoker    Types: Cigarettes    Quit date: 11/19/1989    Years since quitting: 31.2  . Smokeless tobacco: Never Used  Substance and Sexual Activity  . Alcohol use: No    Alcohol/week: 0.0 standard drinks  . Drug use: No  . Sexual activity: Not Currently  Other Topics Concern  . Not on file  Social History Narrative   Regular exercise-no   Caffeine Use-yes   Social Determinants of Health   Financial Resource Strain: Not on file  Food Insecurity: Not on file  Transportation Needs: Not on file  Physical Activity: Not on file  Stress: Not on file  Social Connections: Not on file    Outpatient Encounter Medications as of 02/07/2021  Medication Sig  . aspirin 81  MG tablet Take 81 mg by mouth daily.  . Biotin 5000 MCG CAPS Take 1 capsule by mouth daily.  . blood glucose meter kit and supplies KIT Dispense based on insurance preference. Use daily to check sugars once daily. Dx e11.9  . cetirizine (ZYRTEC) 10 MG tablet Take 10 mg by mouth daily.  . clopidogrel (PLAVIX) 75 MG tablet Take 1 tablet (75 mg total) by mouth daily.  . ferrous sulfate 325 (65 FE) MG EC tablet Take 325 mg by mouth daily with breakfast. Every other day  . isosorbide mononitrate (IMDUR) 30 MG 24 hr tablet Take 30 mg by mouth daily.   . Lancets (ONETOUCH DELICA PLUS [id number]) MISC USE AS INSTRUCTED TO CHECK BLOOD SUGARS ONCE DAILY. DX E 11.9  . metFORMIN (GLUCOPHAGE) 500 MG tablet TAKE 1 TABLET BY MOUTH EVERY DAY WITH BREAKFAST  . Omega-3 Fatty Acids (FISH OIL) 1200 MG CAPS Take 1 capsule by mouth daily.  Glory Rosebush ULTRA test strip USE AS INSTRUCTED TO CHECK BLOOD SUGARS ONCE DAILY. DX E11.9  . traZODone (DESYREL) 50 MG tablet Take 0.5-1 tablets (25-50 mg total) by mouth at bedtime as needed for sleep.  . [DISCONTINUED] rosuvastatin (CRESTOR) 40 MG tablet TAKE 1 TABLET BY MOUTH EVERY DAY   No facility-administered encounter medications on file as of 02/07/2021.    Review of Systems  Constitutional: Negative for appetite change and unexpected weight change.  HENT: Negative for congestion and sinus pressure.   Respiratory: Negative for cough, chest tightness and shortness of breath.   Cardiovascular: Negative for chest pain, palpitations and leg swelling.  Gastrointestinal: Negative for abdominal pain, diarrhea, nausea and vomiting.  Genitourinary: Negative for difficulty urinating and dysuria.  Musculoskeletal: Negative for joint swelling and myalgias.  Skin: Negative for color change and rash.  Neurological: Negative for dizziness, light-headedness and headaches.       Slower speaking.  Took her longer to get dressed.  Felt she was not talking plain.   Psychiatric/Behavioral:  Negative for agitation and dysphoric mood.       Objective:    Physical Exam Constitutional:      General: She is not in acute distress.    Appearance: Normal appearance.  HENT:     Head: Normocephalic and atraumatic.     Right Ear: External ear normal.     Left Ear: External ear normal.  Eyes:     General: No scleral icterus.       Right eye: No discharge.        Left eye: No  discharge.     Conjunctiva/sclera: Conjunctivae normal.  Neck:     Thyroid: No thyromegaly.  Cardiovascular:     Rate and Rhythm: Normal rate and regular rhythm.  Pulmonary:     Effort: No respiratory distress.     Breath sounds: Normal breath sounds. No wheezing.  Abdominal:     General: Bowel sounds are normal.     Palpations: Abdomen is soft.     Tenderness: There is no abdominal tenderness.  Musculoskeletal:        General: No swelling or tenderness.     Cervical back: Neck supple. No tenderness.  Lymphadenopathy:     Cervical: No cervical adenopathy.  Skin:    Findings: No erythema or rash.  Neurological:     Mental Status: She is alert.     Comments: Motor strength appears to be equal bilateral upper and lower extremities.  No facial drooping.  CNs appear to be intact.  Had her write today - both her and her daughter report writing back to normal.   Psychiatric:        Mood and Affect: Mood normal.        Behavior: Behavior normal.     BP (!) 142/68   Pulse 87   Temp 97.8 F (36.6 C)   Ht $R'4\' 11"'[place]$  (1.499 m)   Wt 134 lb 4 oz (60.9 kg)   SpO2 96%   BMI 27.12 kg/m  Wt Readings from Last 3 Encounters:  02/07/21 134 lb 4 oz (60.9 kg)  12/26/20 138 lb (62.6 kg)  12/01/20 135 lb (61.2 kg)     Lab Results  Component Value Date   WBC 6.7 02/07/2021   HGB 11.6 (L) 02/07/2021   HCT 34.4 (L) 02/07/2021   PLT 148.0 (L) 02/07/2021   GLUCOSE 99 02/07/2021   CHOL 140 02/07/2021   TRIG 111.0 02/07/2021   HDL 67.20 02/07/2021   LDLDIRECT 51.0 04/14/2019   LDLCALC 50 02/07/2021   ALT  29 02/07/2021   AST 29 02/07/2021   NA 142 02/07/2021   K 3.8 02/07/2021   CL 108 02/07/2021   CREATININE 1.34 (H) 02/07/2021   BUN 22 02/07/2021   CO2 25 02/07/2021   TSH 1.97 02/07/2021   INR 1.0 06/15/2019   HGBA1C 6.4 02/07/2021   MICROALBUR 32.8 (H) 02/25/2020    MM 3D SCREEN BREAST BILATERAL  Result Date: 09/16/2019 CLINICAL DATA:  Screening. EXAM: DIGITAL SCREENING BILATERAL MAMMOGRAM WITH TOMO AND CAD COMPARISON:  None. ACR Breast Density Category b: There are scattered areas of fibroglandular density. FINDINGS: There are no findings suspicious for malignancy. Images were processed with CAD. IMPRESSION: No mammographic evidence of malignancy. A result letter of this screening mammogram will be mailed directly to the patient. RECOMMENDATION: Screening mammogram in one year. (Code:SM-B-01Y) BI-RADS CATEGORY  1: Negative. Electronically Signed   By: Dorise Bullion III M.D   On: 09/16/2019 17:22       Assessment & Plan:   Problem List Items Addressed This Visit    Afib ([place])    No increased heart rate or palpitations.  On aspirin. Has been followed by cardiology.       Anemia    Followed by hematology.  History of AVM. Also with CKD.  Received IV iron.  Last hgb 12.  Follow cbc.  Recheck today.       Anemia of chronic kidney failure, stage 3 (moderate) (HCC)   Relevant Orders   CBC with Differential/Platelet (Completed)   CAD (  coronary artery disease)    Continue risk factor modfication.  On imdur.  Continue crestor.  No chest pain.        Carotid arterial disease ([place])    Just evaluated by Dr Lucky Cowboy - bilateral - < 50% 08/2020.  Continues on crestor and aspirin.  Hold on repeating since just had checked in 08/2020.  Add plavix as outlined.        CKD (chronic kidney disease) stage 3, GFR 30-59 ml/min (HCC)    Continue to avoid antiinflammatories.  Stay hydrated.  Follow metabolic panel.       Diabetes mellitus ([place])    Sugars overall doing well.  Reviewed outside  sugars.  Controlled.  No problems with low sugars.  Low carb diet and exercise.  Follow met b and a1c.       Relevant Orders   Hemoglobin A1c (Completed)   Basic metabolic panel (Completed)   Difficulty with speech    Noticed change in speech - slower, not talking as plain as outlined.  Also took her longer to get dressed.  Writing change.  Feels back to her baseline now.  Discussed concern regarding TIA.  No focal deficits noted on exam today.  She is taking aspirin.  History of GI bleed.  (recurring). History of AVMs.  Discussed adding plavix x 21 days given acute symptoms as outlined.  Discussed risk of bleeding.  She is agreeable to start.  Continue aspirin - dual antiplatelet therapy x 21 days.  Had carotid ultrasound 08/2020 as outlined.  Discussed MRI.  She is claustrophobic.  Discussed possible open MRI.  Discussed f/u with neurology.  She is agreeable to referral.  Hesitant about MRI.  Discussed if any recurring symptoms or problems, she was to be evaluated immediately.  Monitor for bleeding.       Relevant Orders   Ambulatory referral to Neurology   Hypercholesteremia    On crestor.  Low cholesterol diet and exercise.  Follow lipid panel and liver function tests.        Relevant Orders   Lipid panel (Completed)   TSH (Completed)   Hypertension - Primary    Taking imdur.  Blood pressure has been under good control.  Continue same medication.  Hold on making changes.  Follow pressures.  Follow metabolic panel.        Relevant Orders   Hepatic function panel (Completed)   Pain in hip    Having pain in right hip. Plans to f/u with ortho.           I spent 45 minutes with the patient and more than 50% of the time was spent in consultation regarding the above.  Time spent discussing her symptoms and changes over the weekend.  Time also spent discussing current concerns and further evaluation as well as further treatment.   Einar Pheasant, MD

## 2021-02-09 ENCOUNTER — Other Ambulatory Visit: Payer: Self-pay | Admitting: Internal Medicine

## 2021-02-12 ENCOUNTER — Encounter: Payer: Self-pay | Admitting: Internal Medicine

## 2021-02-12 DIAGNOSIS — R479 Unspecified speech disturbances: Secondary | ICD-10-CM | POA: Insufficient documentation

## 2021-02-12 NOTE — Assessment & Plan Note (Signed)
No increased heart rate or palpitations.  On aspirin. Has been followed by cardiology.

## 2021-02-12 NOTE — Addendum Note (Signed)
Addended by: Alisa Graff on: 02/12/2021 08:19 AM   Modules accepted: Orders

## 2021-02-12 NOTE — Assessment & Plan Note (Signed)
On crestor.  Low cholesterol diet and exercise.  Follow lipid panel and liver function tests.   

## 2021-02-12 NOTE — Assessment & Plan Note (Signed)
Continue to avoid antiinflammatories.  Stay hydrated.  Follow metabolic panel.  

## 2021-02-12 NOTE — Assessment & Plan Note (Signed)
Taking imdur.  Blood pressure has been under good control.  Continue same medication.  Hold on making changes.  Follow pressures.  Follow metabolic panel.

## 2021-02-12 NOTE — Assessment & Plan Note (Signed)
Just evaluated by Dr Lucky Cowboy - bilateral - < 50% 08/2020.  Continues on crestor and aspirin.  Hold on repeating since just had checked in 08/2020.  Add plavix as outlined.

## 2021-02-12 NOTE — Assessment & Plan Note (Signed)
Sugars overall doing well.  Reviewed outside sugars.  Controlled.  No problems with low sugars.  Low carb diet and exercise.  Follow met b and a1c.  

## 2021-02-12 NOTE — Assessment & Plan Note (Signed)
Having pain in right hip. Plans to f/u with ortho.

## 2021-02-12 NOTE — Assessment & Plan Note (Signed)
Noticed change in speech - slower, not talking as plain as outlined.  Also took her longer to get dressed.  Writing change.  Feels back to her baseline now.  Discussed concern regarding TIA.  No focal deficits noted on exam today.  She is taking aspirin.  History of GI bleed.  (recurring). History of AVMs.  Discussed adding plavix x 21 days given acute symptoms as outlined.  Discussed risk of bleeding.  She is agreeable to start.  Continue aspirin - dual antiplatelet therapy x 21 days.  Had carotid ultrasound 08/2020 as outlined.  Discussed MRI.  She is claustrophobic.  Discussed possible open MRI.  Discussed f/u with neurology.  She is agreeable to referral.  Hesitant about MRI.  Discussed if any recurring symptoms or problems, she was to be evaluated immediately.  Monitor for bleeding.

## 2021-02-12 NOTE — Assessment & Plan Note (Addendum)
Followed by hematology.  History of AVM. Also with CKD.  Received IV iron.  Last hgb 12.  Follow cbc.  Recheck today.

## 2021-02-12 NOTE — Assessment & Plan Note (Signed)
Continue risk factor modfication.  On imdur.  Continue crestor.  No chest pain.

## 2021-02-14 ENCOUNTER — Other Ambulatory Visit: Payer: Self-pay | Admitting: Neurology

## 2021-02-14 DIAGNOSIS — G459 Transient cerebral ischemic attack, unspecified: Secondary | ICD-10-CM

## 2021-02-15 ENCOUNTER — Other Ambulatory Visit: Payer: Self-pay

## 2021-02-15 ENCOUNTER — Ambulatory Visit
Admission: RE | Admit: 2021-02-15 | Discharge: 2021-02-15 | Disposition: A | Discharge status: Home / Self Care | Payer: Medicare Other | Source: Ambulatory Visit | Attending: Neurology | Admitting: Neurology

## 2021-02-15 DIAGNOSIS — G459 Transient cerebral ischemic attack, unspecified: Secondary | ICD-10-CM | POA: Diagnosis present

## 2021-02-28 ENCOUNTER — Telehealth: Payer: Self-pay | Admitting: Internal Medicine

## 2021-02-28 NOTE — Telephone Encounter (Signed)
Patient says he fell last night, denies hitting head, said she just hit the right leg on the side on her cane and this has caused a hematoma, per her daughter. Patient also stated her thumb is bruised but tha t she can move it in all directs without pain,patient says her leg is a little sore at the sight of the hematoma , patient is taking 81 mg aspirin a day advised patient to monitor and if hematoma in larges or has increased pain to call the office.Patient has an upcoming appointment on the 19th ask if she felt like she needed to be seen sooner she stated no, but would call the office if any change, and if the hematoma enlarged any more she would call her daughter to take her to be evaluated.

## 2021-02-28 NOTE — Telephone Encounter (Signed)
Please call and triage her. Make sure nothing acute. She sees Dr Nicki Reaper on the 19th but given that she fell, she may need to be evaluated sooner.

## 2021-02-28 NOTE — Telephone Encounter (Signed)
Called to screen patient for her next appt Pt stated that she fell this morning her leg gave out. She said that she hit her nose and is a little sore Pt didn't want to schedule a sooner appt

## 2021-03-02 ENCOUNTER — Other Ambulatory Visit: Payer: Self-pay | Admitting: Internal Medicine

## 2021-03-07 ENCOUNTER — Telehealth: Payer: Medicare Other | Admitting: Internal Medicine

## 2021-03-11 ENCOUNTER — Other Ambulatory Visit: Payer: Self-pay | Admitting: Internal Medicine

## 2021-03-14 ENCOUNTER — Other Ambulatory Visit: Payer: Self-pay

## 2021-03-14 ENCOUNTER — Ambulatory Visit: Payer: Medicare Other | Attending: Neurology

## 2021-03-14 DIAGNOSIS — M6281 Muscle weakness (generalized): Secondary | ICD-10-CM | POA: Diagnosis present

## 2021-03-14 DIAGNOSIS — R2681 Unsteadiness on feet: Secondary | ICD-10-CM | POA: Diagnosis present

## 2021-03-14 DIAGNOSIS — R2689 Other abnormalities of gait and mobility: Secondary | ICD-10-CM | POA: Diagnosis not present

## 2021-03-15 ENCOUNTER — Other Ambulatory Visit: Payer: Self-pay | Admitting: Internal Medicine

## 2021-03-16 NOTE — Therapy (Signed)
[facility] [address] [place], [place], [address] Phone: [phone]   Fax:  [phone]  Physical Therapy Evaluation  Patient Details  Name: Jocelyn Elliott MRN: [id number] Date of Birth: 22-Sep-1932 Referring Provider (PT): Dr. Manuella Ghazi   Encounter Date: 03/14/2021    Past Medical History:  Diagnosis Date  . Arthritis   . AV malformation of gastrointestinal tract   . Blood in stool   . CAD (coronary artery disease)   . Carotid arterial disease ([place])   . Cataracts, bilateral   . Chronic blood loss anemia   . Chronic cystitis   . Complication of anesthesia    reaction to propofol  . Depression   . Diabetes mellitus ([place])   . GERD (gastroesophageal reflux disease)   . Hyperlipidemia   . Hypertension   . IDA (iron deficiency anemia)   . Stress incontinence     Past Surgical History:  Procedure Laterality Date  . ABDOMINAL HYSTERECTOMY  1972   ovaries left in place  . APPENDECTOMY  1992  . BACK SURGERY  06/08/2010   L3, L4, L5  . CAROTID ARTERY ANGIOPLASTY  11/03  . [place], 92   right then left   . CHOLECYSTECTOMY  1993  . COLONOSCOPY WITH PROPOFOL N/A 02/02/2019   Procedure: COLONOSCOPY WITH PROPOFOL;  Surgeon: Manya Silvas, MD;  Location: [facility] ENDOSCOPY;  Service: Endoscopy;  Laterality: N/A;  . ESOPHAGOGASTRODUODENOSCOPY N/A 02/02/2019   Procedure: ESOPHAGOGASTRODUODENOSCOPY (EGD);  Surgeon: Manya Silvas, MD;  Location: [facility] ENDOSCOPY;  Service: Endoscopy;  Laterality: N/A;  . FOOT SURGERY  06/2007   Left  . right carotid artery surgery  01/09/13   Dr. Delana Meyer @ [facility]  . TOTAL HIP ARTHROPLASTY  05/03/2011   left 12, right 11/07    There were no vitals filed for this visit.        [facility] PT Assessment - 03/16/21 0001      Assessment   Referring Provider (PT) Dr. Manuella Ghazi    Next MD Visit 05/17/21      Timed Up and Go Test   Normal TUG (seconds) 14.5            OBSERVATION/GAIT: Pt ambulates with SPC from clinic lobby into PT gym, amb with decreased gait speed and her gait pattern deviates laterally side to side  STRENGTH Hip flexion: R 4/5, L 3/5 Knee flexion and extension: R 4/5, L 4/5 Ankle DF: R 5/5, L 5/5 Ankle Inversion: R 5/5, L 4/5 Ankle Eversion: R 5/5, L 5/5  BALANCE: Static sitting balance: good Static standing balance: feet together EO x 15 sec, EC pt unable (she states she "feels like she is tilted and going to fall") Single leg balance: pt unable to stand SLS without UE support > sec Transfers: pt requires use of UE support for sit to stand transfer (pushes hands on thighs) and moves slowly    OUTCOME MEASURES:  TEST 03/14/21   Interpretation  5 times sit<>stand 19.6 sec   >60 yo, >15 sec indicates increased risk for falls  10 meter walk test NT   <1.0 m/s indicates increased risk for falls; limited community ambulator  Timed up and Go 14.5 sec   <14 sec indicates increased risk for falls  6 minute walk test Deferred to visit 2   1000 feet is community Conservator, museum/gallery Assessment Deferred to visit 2   <36/56 (100% risk for falls), 37-45 (80% risk for  falls); 46-51 (>50% risk for falls); 52-55 (lower risk <25% of falls)  FOTO % 43/48    % predicted at D/C      Objective measurements completed on examination: See above findings.          PT Short Term Goals - 03/14/21 1258      PT SHORT TERM GOAL #1   Title Pt will demonstrate ability to safely step laterally over small hurdle using 1 rail (to simulate stepping in/out of shower) to promote independence with showering    Baseline 03/14/21- pt reports she takes sponge baths because she is fearful of falling    Time 3    Period Weeks    Status New    Target Date 04/04/21             PT Long Term Goals - 03/16/21 1214      PT LONG TERM GOAL #1   Title pt will be able to ambulate independently transfer sit to stand (and stand to sit) in and out of  passenger side of car and in/out of living room chair without loss of balance    Baseline 03/14/21- pt reports having difficulty with transfers from sit to stand- this is when she loses her balance at home    Time 6    Period Weeks    Status New    Target Date 04/25/21      PT LONG TERM GOAL #2   Title Pt will be able to perform 10 minutes of light housework (sweep kitchen floor) without loss of balance    Baseline 03/14/21- pt reports avoiding cleaning/housework because of her balance    Time 6    Period Weeks    Status New    Target Date 04/25/21      PT LONG TERM GOAL #3   Title Improve FOTO to 48 indicating improved functional ability at home without being limited by balance    Baseline 03/14/21- 43    Time 6    Period Weeks    Status New    Target Date 04/25/21      PT LONG TERM GOAL #4   Title Improve ABC-S scale by at least 13% indicating a clinical significant change in balance confidence    Baseline 03/14/21: 15/45 (33%)    Time 6    Period Weeks    Status New    Target Date 04/25/21                   Patient will benefit from skilled therapeutic intervention in order to improve the following deficits and impairments:  Abnormal gait,Difficulty walking,Decreased range of motion,Decreased endurance,Pain,Decreased activity tolerance,Improper body mechanics,Decreased balance,Decreased strength,Decreased mobility  Visit Diagnosis: Imbalance  Muscle weakness (generalized)  Unsteadiness on feet     Problem List Patient Active Problem List   Diagnosis Date Noted  . Difficulty with speech 02/12/2021  . Cough 12/10/2020  . Trigger finger, right ring finger 03/05/2020  . Anemia of chronic kidney failure, stage 3 (moderate) ([place]) 07/20/2019  . Chest pain 04/17/2019  . Bradycardia 02/02/2019  . Afib ([place]) 02/02/2019  . CKD (chronic kidney disease) stage 3, GFR 30-59 ml/min (HCC) 10/18/2018  . Rash 06/14/2018  . SI (sacroiliac) joint dysfunction 01/07/2018   . Dizziness 05/22/2017  . History of total left hip replacement 05/17/2017  . Trochanteric bursitis of left hip 05/17/2017  . Iron deficiency anemia due to chronic blood loss 09/20/2016  . Hypotension 04/06/2016  . Respiratory infection  04/06/2016  . Other specified respiratory disorders 04/06/2016  . Health care maintenance 10/28/2015  . 2-vessel coronary artery disease 09/22/2015  . SOB (shortness of breath) on exertion 09/20/2015  . Knee pain, bilateral 09/20/2015  . Sleeping difficulty 07/24/2015  . Hepatomegaly 07/24/2015  . Sleep disorder 07/24/2015  . Hepatomegaly, not elsewhere classified 07/24/2015  . Generalized anxiety disorder 04/23/2015  . UTI (urinary tract infection) 05/22/2014  . Hip pain 10/11/2013  . Pain in hip 10/11/2013  . Tendonitis 07/19/2013  . Leg numbness 04/07/2013  . CAD (coronary artery disease) 09/05/2012  . Hypertension 09/05/2012  . Hypercholesteremia 09/05/2012  . Diabetes mellitus ([place]) 09/05/2012  . Carotid arterial disease ([place]) 09/05/2012  . Normocytic anemia 09/05/2012  . Disorder of artery or arteriole ([place]) 09/05/2012  . Essential (primary) hypertension 09/05/2012  . Anemia 09/05/2012    Pincus Badder 03/16/2021, 12:24 PM Merdis Delay, PT, DPT Physical Therapist -   [facility] [address] [place], [place], [address] Phone: [phone]   Fax:  [phone]  Name: MERRI KREUTER MRN: [id number] Date of Birth: 1932-01-29

## 2021-03-17 ENCOUNTER — Other Ambulatory Visit: Payer: Self-pay | Admitting: Internal Medicine

## 2021-03-19 DIAGNOSIS — G459 Transient cerebral ischemic attack, unspecified: Secondary | ICD-10-CM

## 2021-03-19 HISTORY — DX: Transient cerebral ischemic attack, unspecified: G45.9

## 2021-03-20 ENCOUNTER — Other Ambulatory Visit: Payer: Self-pay

## 2021-03-20 ENCOUNTER — Ambulatory Visit: Payer: Medicare Other | Attending: Neurology

## 2021-03-20 DIAGNOSIS — R2689 Other abnormalities of gait and mobility: Secondary | ICD-10-CM | POA: Insufficient documentation

## 2021-03-20 DIAGNOSIS — R2681 Unsteadiness on feet: Secondary | ICD-10-CM | POA: Diagnosis present

## 2021-03-20 DIAGNOSIS — M6281 Muscle weakness (generalized): Secondary | ICD-10-CM | POA: Insufficient documentation

## 2021-03-20 NOTE — Therapy (Signed)
[facility] [address] [place], [place], [address] Phone: [phone]   Fax:  [phone]  Physical Therapy Treatment  Patient Details  Name: Jocelyn Elliott MRN: [id number] Date of Birth: 1932/02/09 Referring Provider (PT): Dr. Manuella Ghazi   Encounter Date: 03/20/2021   PT End of Session - 03/20/21 1156    Visit Number 2    Number of Visits 12    Date for PT Re-Evaluation 04/25/21    Authorization Type Medicare    Authorization Time Period 10 visits, eval 03/14/21    Authorization - Visit Number 2    Authorization - Number of Visits 10    Progress Note Due on Visit 10    PT Start Time 1025    PT Stop Time 1110    PT Time Calculation (min) 45 min    Equipment Utilized During Treatment Gait belt    Activity Tolerance Patient tolerated treatment well    Behavior During Therapy [facility] for tasks assessed/performed           Past Medical History:  Diagnosis Date  . Arthritis   . AV malformation of gastrointestinal tract   . Blood in stool   . CAD (coronary artery disease)   . Carotid arterial disease ([place])   . Cataracts, bilateral   . Chronic blood loss anemia   . Chronic cystitis   . Complication of anesthesia    reaction to propofol  . Depression   . Diabetes mellitus ([place])   . GERD (gastroesophageal reflux disease)   . Hyperlipidemia   . Hypertension   . IDA (iron deficiency anemia)   . Stress incontinence     Past Surgical History:  Procedure Laterality Date  . ABDOMINAL HYSTERECTOMY  1972   ovaries left in place  . APPENDECTOMY  1992  . BACK SURGERY  06/08/2010   L3, L4, L5  . CAROTID ARTERY ANGIOPLASTY  11/03  . [place], 92   right then left   . CHOLECYSTECTOMY  1993  . COLONOSCOPY WITH PROPOFOL N/A 02/02/2019   Procedure: COLONOSCOPY WITH PROPOFOL;  Surgeon: Manya Silvas, MD;  Location: [facility] ENDOSCOPY;  Service: Endoscopy;  Laterality: N/A;  . ESOPHAGOGASTRODUODENOSCOPY N/A 02/02/2019    Procedure: ESOPHAGOGASTRODUODENOSCOPY (EGD);  Surgeon: Manya Silvas, MD;  Location[facility] ENDOSCOPY;  Service: Endoscopy;  Laterality: N/A;  . FOOT SURGERY  06/2007   Left  . right carotid artery surgery  01/09/13   Dr. Delana Meyer @ [facility]  . TOTAL HIP ARTHROPLASTY  05/03/2011   left 12, right 11/07    There were no vitals filed for this visit.   Subjective Assessment - 03/20/21 1155    Subjective Pt is having difficulty getting in/out of car because of her hip pain and she can lose her balance when she tries to step into car.  She is still taking sponge baths and not showering yet.  She reports no falls since last appointment.    Pertinent History L THA in 2012, R THA 2007.    Patient Stated Goals to improve balance, reduce falls          Treatment Today:  Neuro re-ed: -stand feet apart on airex, weight shifts forward, lateral R/L, backwards to test limits of stability, she loses balance backwards requiring use of UE support to regain balance (PT SBA with gait belt) -in parallel bars: feet together EO head turns R/L 2x15, look up/down 2x15 (occasional UE support to regain balance) -in parallel  bars: airex balance beam: forward heel toe tandem walk 3 laps, lateral stepping laps 3 laps (occasiona UE support to regain balance)    Therapeutic Exercise: -hurdles: forward stepping, side stepping (4 laps ea), cues for: focus on 1 hand support, slow and controlled pacing during exercise, also focused on hip flexion strategy for clearing hurdles and when pt reports L hip pain became limiting factor then transitioned to knee flexion strategy for clearing L foot.  Discussed purpose of this exercise to practice stepping in/out of shower which has a very small lip to clear.    -sit to stand: with use of UE on thighs 4x5, cues for: pacing/control/body mechanics (scoot hips forward before standing) and not using momentum  -standing hip flexion: through partial ROM 2x10, attempted seated hip  flexion (too difficult)  -transfer sit to supine hooklying to attempt supine hip flexion exercise; however pt was not able to tol that position today due to report of increased L hip pain in that position and needed to sit up.    -body mechanics/transfer training: stand to sit into passenger side of her daughter's car (sedan style car with pillow on seat).  She utilizes stepping into car with L foot technique but is not able to clear edge of car and uses her hands to lift L leg which is unsafe and puts her at risk for falls.  PT instructed her on technique to sit on seat first then use hands to transfer L LE in and then R LE into car; pt practiced this technique and is much safer.  Encouraged her to use this technique moving forward to reduce fall risk and improve safety.                           PT Education - 03/20/21 1156    Education Details body mechanics for transfer in/out of car, exercise technique    Person(s) Educated Patient;Child(ren)    Methods Explanation;Demonstration    Comprehension Verbalized understanding;Returned demonstration;Need further instruction            PT Short Term Goals - 03/14/21 1258      PT SHORT TERM GOAL #1   Title Pt will demonstrate ability to safely step laterally over small hurdle using 1 rail (to simulate stepping in/out of shower) to promote independence with showering    Baseline 03/14/21- pt reports she takes sponge baths because she is fearful of falling    Time 3    Period Weeks    Status New    Target Date 04/04/21             PT Long Term Goals - 03/16/21 1214      PT LONG TERM GOAL #1   Title pt will be able to ambulate independently transfer sit to stand (and stand to sit) in and out of passenger side of car and in/out of living room chair without loss of balance    Baseline 03/14/21- pt reports having difficulty with transfers from sit to stand- this is when she loses her balance at home    Time 6     Period Weeks    Status New    Target Date 04/25/21      PT LONG TERM GOAL #2   Title Pt will be able to perform 10 minutes of light housework (sweep kitchen floor) without loss of balance    Baseline 03/14/21- pt reports avoiding cleaning/housework because of her balance  Time 6    Period Weeks    Status New    Target Date 04/25/21      PT LONG TERM GOAL #3   Title Improve FOTO to 48 indicating improved functional ability at home without being limited by balance    Baseline 03/14/21- 43    Time 6    Period Weeks    Status New    Target Date 04/25/21      PT LONG TERM GOAL #4   Title Improve ABC-S scale by at least 13% indicating a clinical significant change in balance confidence    Baseline 03/14/21: 15/45 (33%)    Time 6    Period Weeks    Status New    Target Date 04/25/21                 Plan - 03/20/21 1157    Clinical Impression Statement Pt had difficulty with standing L hip flexion movement due to pain and lack of strength.  This contributes to difficulty with mobility during stair navigation (she has to use step to pattern with R LE leading), stepping over hurdles, and transfers into car.  She requires multimodal cues for pacing during exercises and not rushing during balance exercies.  She does require 1-2 UE support to regain balance if COM shifts posteriorly.  Overall she should continue to benefit from skilled PT to improve balance and strength and to improve safe mobility/reduce fall risk during transfers (car, sit to supine, sit to stand), walking, and home activities.    Personal Factors and Comorbidities Age;Comorbidity 3+    Comorbidities h/o multiple THA, lumbar spine surgery, TIA (affected hand writing, speech, balance)    Examination-Activity Limitations Bathing;Bend;Lift;Locomotion Level;Transfers;Stairs    Examination-Participation Restrictions Community Activity;Cleaning;Laundry    Stability/Clinical Decision Making Evolving/Moderate complexity     Rehab Potential Good    PT Frequency 2x / week    PT Duration 6 weeks    PT Next Visit Plan initiate LE strengthening and balance exercises; implement BERG    PT Home Exercise Plan [employer] : [email or web address]    Consulted and Agree with Plan of Care Patient;Family member/caregiver    Family Member Consulted daughter Shirlean Mylar)           Patient will benefit from skilled therapeutic intervention in order to improve the following deficits and impairments:  Abnormal gait,Difficulty walking,Decreased range of motion,Decreased endurance,Pain,Decreased activity tolerance,Improper body mechanics,Decreased balance,Decreased strength,Decreased mobility  Visit Diagnosis: Imbalance  Muscle weakness (generalized)  Unsteadiness on feet     Problem List Patient Active Problem List   Diagnosis Date Noted  . Difficulty with speech 02/12/2021  . Cough 12/10/2020  . Trigger finger, right ring finger 03/05/2020  . Anemia of chronic kidney failure, stage 3 (moderate) ([place]) 07/20/2019  . Chest pain 04/17/2019  . Bradycardia 02/02/2019  . Afib ([place]) 02/02/2019  . CKD (chronic kidney disease) stage 3, GFR 30-59 ml/min (HCC) 10/18/2018  . Rash 06/14/2018  . SI (sacroiliac) joint dysfunction 01/07/2018  . Dizziness 05/22/2017  . History of total left hip replacement 05/17/2017  . Trochanteric bursitis of left hip 05/17/2017  . Iron deficiency anemia due to chronic blood loss 09/20/2016  . Hypotension 04/06/2016  . Respiratory infection 04/06/2016  . Other specified respiratory disorders 04/06/2016  . Health care maintenance 10/28/2015  . 2-vessel coronary artery disease 09/22/2015  . SOB (shortness of breath) on exertion 09/20/2015  . Knee pain, bilateral 09/20/2015  . Sleeping difficulty 07/24/2015  . Hepatomegaly 07/24/2015  .  Sleep disorder 07/24/2015  . Hepatomegaly, not elsewhere classified 07/24/2015  . Generalized anxiety disorder 04/23/2015  . UTI (urinary tract infection) 05/22/2014  .  Hip pain 10/11/2013  . Pain in hip 10/11/2013  . Tendonitis 07/19/2013  . Leg numbness 04/07/2013  . CAD (coronary artery disease) 09/05/2012  . Hypertension 09/05/2012  . Hypercholesteremia 09/05/2012  . Diabetes mellitus ([place]) 09/05/2012  . Carotid arterial disease ([place]) 09/05/2012  . Normocytic anemia 09/05/2012  . Disorder of artery or arteriole ([place]) 09/05/2012  . Essential (primary) hypertension 09/05/2012  . Anemia 09/05/2012    Pincus Badder 03/20/2021, 12:12 PM Merdis Delay, PT, DPT Physical Therapist -    [facility] [address] [place], [place], [address] Phone: [phone]   Fax:  [phone]  Name: Jocelyn Elliott MRN: [id number] Date of Birth: [birth year]

## 2021-03-22 ENCOUNTER — Ambulatory Visit: Payer: Medicare Other

## 2021-03-22 ENCOUNTER — Other Ambulatory Visit: Payer: Self-pay

## 2021-03-22 DIAGNOSIS — R2681 Unsteadiness on feet: Secondary | ICD-10-CM

## 2021-03-22 DIAGNOSIS — M6281 Muscle weakness (generalized): Secondary | ICD-10-CM

## 2021-03-22 DIAGNOSIS — R2689 Other abnormalities of gait and mobility: Secondary | ICD-10-CM

## 2021-03-22 NOTE — Therapy (Signed)
[facility] [address] [place], [place], [address] Phone: [phone]   Fax:  [phone]  Physical Therapy Treatment  Patient Details  Name: Jocelyn Elliott MRN: [id number] Date of Birth: October 24, 1932 Referring Provider (PT): Dr. Manuella Ghazi   Encounter Date: 03/22/2021   PT End of Session - 03/22/21 1121    Visit Number 3    Number of Visits 12    Date for PT Re-Evaluation 04/25/21    Authorization Type Medicare    Authorization Time Period 10 visits, eval 03/14/21    Authorization - Visit Number 3    Authorization - Number of Visits 10    Progress Note Due on Visit 10    PT Start Time [id number]    PT Stop Time 1100    PT Time Calculation (min) 45 min    Equipment Utilized During Treatment Gait belt    Activity Tolerance Patient tolerated treatment well    Behavior During Therapy [facility] for tasks assessed/performed           Past Medical History:  Diagnosis Date  . Arthritis   . AV malformation of gastrointestinal tract   . Blood in stool   . CAD (coronary artery disease)   . Carotid arterial disease ([place])   . Cataracts, bilateral   . Chronic blood loss anemia   . Chronic cystitis   . Complication of anesthesia    reaction to propofol  . Depression   . Diabetes mellitus ([place])   . GERD (gastroesophageal reflux disease)   . Hyperlipidemia   . Hypertension   . IDA (iron deficiency anemia)   . Stress incontinence     Past Surgical History:  Procedure Laterality Date  . ABDOMINAL HYSTERECTOMY  1972   ovaries left in place  . APPENDECTOMY  1992  . BACK SURGERY  06/08/2010   L3, L4, L5  . CAROTID ARTERY ANGIOPLASTY  11/03  . [place], 89   right then left   . CHOLECYSTECTOMY  1993  . COLONOSCOPY WITH PROPOFOL N/A 02/02/2019   Procedure: COLONOSCOPY WITH PROPOFOL;  Surgeon: Manya Silvas, MD;  Location: [facility] ENDOSCOPY;  Service: Endoscopy;  Laterality: N/A;  . ESOPHAGOGASTRODUODENOSCOPY N/A 02/02/2019    Procedure: ESOPHAGOGASTRODUODENOSCOPY (EGD);  Surgeon: Manya Silvas, MD;  Location: [facility] ENDOSCOPY;  Service: Endoscopy;  Laterality: N/A;  . FOOT SURGERY  06/2007   Left  . right carotid artery surgery  01/09/13   Dr. Delana Meyer @ [facility]  . TOTAL HIP ARTHROPLASTY  05/03/2011   left 12, right 11/07    There were no vitals filed for this visit.   Subjective Assessment - 03/22/21 1117    Subjective Pt reports she has not fallen since last visit.  She worked on her HEP.  She continues to feel like her L hip pain and weakness contributes to her balance issue.  She tried getting into the car using the stand to sit transfer instead of stepping into passenger side.  She was able to transfer independently into the car and did not need help from her daughter when she did the transfer this way.    Pertinent History L THA in 2012, R THA 2007.    Patient Stated Goals to improve balance, reduce falls            Treatment Today:    Treatment Today:  Neuro re-ed: -in parallel bars: on airex feet together EO head turns R/L 2x15, look up/down 2x15 (occasional  UE support to regain balance) -in parallel bars: on airex balance beam: forward heel toe tandem walk 3 laps, lateral stepping laps 3 laps (occasiona UE support to regain balance) -in parallel bars: on airex alternating foot taps onto small 4 inch step, 2x15 (pt with intermittent single hand support)  -stepping activities for balance/coordination on blue floor ladder:  Forward/reverse "rock step" leading with R foot front/back and L foot front/back x20; "rock step" laterally away from base of support alternating R/L x20; stepping forward/reverse/laterally with PT cue which direction (so pt has to quickly process and respond and maintain balance) x 3 min (PT CGA and pt in gait belt), pt had 1 large loss of balance and required PT assistance to regain balance   Therapeutic Exercise: -hurdles: forward stepping, side stepping (4 laps ea),  cues for: focus on 1 hand support, slow and controlled pacing during exercise, also focused on hip flexion strategy for clearing hurdles and when pt reports L hip pain became limiting factor then transitioned to knee flexion strategy for clearing L foot.  Discussed purpose of this exercise to practice stepping in/out of shower which has a very small lip to clear.    -sit to stand: with use of UE on thighs 4x5, cues for: pacing/control/body mechanics (scoot hips forward before standing) and not using momentum  -standing hip flexion: through partial ROM 2x10 -standing hip abd: x15 R and L  Amb on grass/incline and decline sideway with SPC to practice navigating surface changes- forward, reverse, lateral amb with PT CGA and pt using SPC and gait belt donned  -body mechanics/transfer training: pt ed for safe transfer in/out of her shower (has a seat, non stick floor, hand held shower handle, grab bars), discussed strategy in clinic and instructed pt to practice the transfer with her daughter to address her fear/build confidence with this activity; she continues to take sponge baths but states she would like to be able to shower again independently       PT Education - 03/22/21 1120    Education Details reviewed car transfer at end of session and technique looks good today; pt ed for exercise pacing/form during session    Person(s) Educated Patient;Child(ren)    Methods Demonstration;Explanation;Tactile cues;Verbal cues    Comprehension Verbalized understanding;Returned demonstration;Need further instruction            PT Short Term Goals - 03/14/21 1258      PT SHORT TERM GOAL #1   Title Pt will demonstrate ability to safely step laterally over small hurdle using 1 rail (to simulate stepping in/out of shower) to promote independence with showering    Baseline 03/14/21- pt reports she takes sponge baths because she is fearful of falling    Time 3    Period Weeks    Status New    Target  Date 04/04/21             PT Long Term Goals - 03/16/21 1214      PT LONG TERM GOAL #1   Title pt will be able to ambulate independently transfer sit to stand (and stand to sit) in and out of passenger side of car and in/out of living room chair without loss of balance    Baseline 03/14/21- pt reports having difficulty with transfers from sit to stand- this is when she loses her balance at home    Time 6    Period Weeks    Status New    Target Date 04/25/21  PT LONG TERM GOAL #2   Title Pt will be able to perform 10 minutes of light housework (sweep kitchen floor) without loss of balance    Baseline 03/14/21- pt reports avoiding cleaning/housework because of her balance    Time 6    Period Weeks    Status New    Target Date 04/25/21      PT LONG TERM GOAL #3   Title Improve FOTO to 48 indicating improved functional ability at home without being limited by balance    Baseline 03/14/21- 43    Time 6    Period Weeks    Status New    Target Date 04/25/21      PT LONG TERM GOAL #4   Title Improve ABC-S scale by at least 13% indicating a clinical significant change in balance confidence    Baseline 03/14/21: 15/45 (33%)    Time 6    Period Weeks    Status New    Target Date 04/25/21                 Plan - 03/22/21 1121    Personal Factors and Comorbidities Age;Comorbidity 3+    Comorbidities h/o multiple THA, lumbar spine surgery, TIA (affected hand writing, speech, balance)    Examination-Activity Limitations Bathing;Bend;Lift;Locomotion Level;Transfers;Stairs    Examination-Participation Restrictions Community Activity;Cleaning;Laundry    Stability/Clinical Decision Making Evolving/Moderate complexity    Rehab Potential Good    PT Frequency 2x / week    PT Duration 6 weeks    PT Next Visit Plan BERG, continue with dynamc balance exercises and hip strengthening    PT Home Exercise Plan Medbridge : [email or web address]    Consulted and Agree with Plan of Care Patient;Family  member/caregiver    Family Member Consulted daughter Shirlean Mylar)           Patient will benefit from skilled therapeutic intervention in order to improve the following deficits and impairments:  Abnormal gait,Difficulty walking,Decreased range of motion,Decreased endurance,Pain,Decreased activity tolerance,Improper body mechanics,Decreased balance,Decreased strength,Decreased mobility  Visit Diagnosis: Imbalance  Muscle weakness (generalized)  Unsteadiness on feet     Problem List Patient Active Problem List   Diagnosis Date Noted  . Difficulty with speech 02/12/2021  . Cough 12/10/2020  . Trigger finger, right ring finger 03/05/2020  . Anemia of chronic kidney failure, stage 3 (moderate) ([place]) 07/20/2019  . Chest pain 04/17/2019  . Bradycardia 02/02/2019  . Afib ([place]) 02/02/2019  . CKD (chronic kidney disease) stage 3, GFR 30-59 ml/min ([facility]) 10/18/2018  . Rash 06/14/2018  . SI (sacroiliac) joint dysfunction 01/07/2018  . Dizziness 05/22/2017  . History of total left hip replacement 05/17/2017  . Trochanteric bursitis of left hip 05/17/2017  . Iron deficiency anemia due to chronic blood loss 09/20/2016  . Hypotension 04/06/2016  . Respiratory infection 04/06/2016  . Other specified respiratory disorders 04/06/2016  . Health care maintenance 10/28/2015  . 2-vessel coronary artery disease 09/22/2015  . SOB (shortness of breath) on exertion 09/20/2015  . Knee pain, bilateral 09/20/2015  . Sleeping difficulty 07/24/2015  . Hepatomegaly 07/24/2015  . Sleep disorder 07/24/2015  . Hepatomegaly, not elsewhere classified 07/24/2015  . Generalized anxiety disorder 04/23/2015  . UTI (urinary tract infection) 05/22/2014  . Hip pain 10/11/2013  . Pain in hip 10/11/2013  . Tendonitis 07/19/2013  . Leg numbness 04/07/2013  . CAD (coronary artery disease) 09/05/2012  . Hypertension 09/05/2012  . Hypercholesteremia 09/05/2012  . Diabetes mellitus ([place]) 09/05/2012  . Carotid  arterial disease ([place])  09/05/2012  . Normocytic anemia 09/05/2012  . Disorder of artery or arteriole ([place]) 09/05/2012  . Essential (primary) hypertension 09/05/2012  . Anemia 09/05/2012    Pincus Badder 03/22/2021, 11:35 AM Merdis Delay, PT, DPT Physical Therapist - [place]  [facility] [address] [place], [place], [address] Phone: [phone]   Fax:  [phone]  Name: Jocelyn Elliott MRN: [id number] Date of Birth: [birth year]

## 2021-03-24 ENCOUNTER — Ambulatory Visit (INDEPENDENT_AMBULATORY_CARE_PROVIDER_SITE_OTHER): Payer: Medicare Other

## 2021-03-24 VITALS — BP 104/50 | Temp 83.0°F | Ht 59.0 in | Wt 134.0 lb

## 2021-03-24 DIAGNOSIS — Z0000 Encounter for general adult medical examination without abnormal findings: Secondary | ICD-10-CM | POA: Diagnosis not present

## 2021-03-24 NOTE — Patient Instructions (Addendum)
[name] , Thank you for taking time to come for your Medicare Wellness Visit. I appreciate your ongoing commitment to your health goals. Please review the following plan we discussed and let me know if I can assist you in the future.   These are the goals we discussed: Goals    . Follow up with Primary Care Provider     As needed       This is a list of the screening recommended for you and due dates:  Health Maintenance  Topic Date Due  . Mammogram  09/15/2020  . Urine Protein Check  02/24/2021  . DEXA scan (bone density measurement)  03/24/2022*  . Tetanus Vaccine  03/24/2022*  . Flu Shot  06/19/2021  . Hemoglobin A1C  08/10/2021  . Complete foot exam   10/03/2021  . Eye exam for diabetics  01/02/2022  . COVID-19 Vaccine  Completed  . Pneumonia vaccines  Completed  . HPV Vaccine  Aged Out  *Topic was postponed. The date shown is not the original due date.     Immunizations Immunization History  Administered Date(s) Administered  . Fluad Quad(high Dose 65+) 07/28/2019, 10/03/2020  . Influenza, High Dose Seasonal PF 11/08/2016, 09/26/2017, 10/14/2018  . Influenza,inj,Quad PF,6+ Mos 10/07/2013, 07/18/2015  . PFIZER(Purple Top)SARS-COV-2 Vaccination 01/15/2020, 02/10/2020, 11/01/2020  . Pneumococcal Conjugate-13 09/26/2017  . Pneumococcal Polysaccharide-23 09/24/2007   Keep all routine maintenance appointments.   Follow up 04/04/21 @ 11:00  Advanced directives: End of life planning; Advance aging; Advanced directives discussed.  Copy of current HCPOA/Living Will requested.    Conditions/risks identified: none new  Follow up in one year for your annual wellness visit    Preventive Care 65 Years and Older, Female Preventive care refers to lifestyle choices and visits with your health care provider that can promote health and wellness. What does preventive care include?  A yearly physical exam. This is also called an annual well check.  Dental exams once or twice  a year.  Routine eye exams. Ask your health care provider how often you should have your eyes checked.  Personal lifestyle choices, including:  Daily care of your teeth and gums.  Regular physical activity.  Eating a healthy diet.  Avoiding tobacco and drug use.  Limiting alcohol use.  Practicing safe sex.  Taking low-dose aspirin every day.  Taking vitamin and mineral supplements as recommended by your health care provider. What happens during an annual well check? The services and screenings done by your health care provider during your annual well check will depend on your age, overall health, lifestyle risk factors, and family history of disease. Counseling  Your health care provider may ask you questions about your:  Alcohol use.  Tobacco use.  Drug use.  Emotional well-being.  Home and relationship well-being.  Sexual activity.  Eating habits.  History of falls.  Memory and ability to understand (cognition).  Work and work Statistician.  Reproductive health. Screening  You may have the following tests or measurements:  Height, weight, and BMI.  Blood pressure.  Lipid and cholesterol levels. These may be checked every 5 years, or more frequently if you are over 64 years old.  Skin check.  Lung cancer screening. You may have this screening every year starting at age 64 if you have a 30-pack-year history of smoking and currently smoke or have quit within the past 15 years.  Fecal occult blood test (FOBT) of the stool. You may have this test every year starting at  age 50.  Flexible sigmoidoscopy or colonoscopy. You may have a sigmoidoscopy every 5 years or a colonoscopy every 10 years starting at age 13.  Hepatitis C blood test.  Hepatitis B blood test.  Sexually transmitted disease (STD) testing.  Diabetes screening. This is done by checking your blood sugar (glucose) after you have not eaten for a while (fasting). You may have this done every  1-3 years.  Bone density scan. This is done to screen for osteoporosis. You may have this done starting at age 73.  Mammogram. This may be done every 1-2 years. Talk to your health care provider about how often you should have regular mammograms. Talk with your health care provider about your test results, treatment options, and if necessary, the need for more tests. Vaccines  Your health care provider may recommend certain vaccines, such as:  Influenza vaccine. This is recommended every year.  Tetanus, diphtheria, and acellular pertussis (Tdap, Td) vaccine. You may need a Td booster every 10 years.  Zoster vaccine. You may need this after age 12.  Pneumococcal 13-valent conjugate (PCV13) vaccine. One dose is recommended after age 42.  Pneumococcal polysaccharide (PPSV23) vaccine. One dose is recommended after age 90. Talk to your health care provider about which screenings and vaccines you need and how often you need them. This information is not intended to replace advice given to you by your health care provider. Make sure you discuss any questions you have with your health care provider. Document Released: 12/02/2015 Document Revised: 07/25/2016 Document Reviewed: 09/06/2015 Elsevier Interactive Patient Education  2017 [place] Prevention in the Home Falls can cause injuries. They can happen to people of all ages. There are many things you can do to make your home safe and to help prevent falls. What can I do on the outside of my home?  Regularly fix the edges of walkways and driveways and fix any cracks.  Remove anything that might make you trip as you walk through a door, such as a raised step or threshold.  Trim any bushes or trees on the path to your home.  Use bright outdoor lighting.  Clear any walking paths of anything that might make someone trip, such as rocks or tools.  Regularly check to see if handrails are loose or broken. Make sure that both sides of  any steps have handrails.  Any raised decks and porches should have guardrails on the edges.  Have any leaves, snow, or ice cleared regularly.  Use sand or salt on walking paths during winter.  Clean up any spills in your garage right away. This includes oil or grease spills. What can I do in the bathroom?  Use night lights.  Install grab bars by the toilet and in the tub and shower. Do not use towel bars as grab bars.  Use non-skid mats or decals in the tub or shower.  If you need to sit down in the shower, use a plastic, non-slip stool.  Keep the floor dry. Clean up any water that spills on the floor as soon as it happens.  Remove soap buildup in the tub or shower regularly.  Attach bath mats securely with double-sided non-slip rug tape.  Do not have throw rugs and other things on the floor that can make you trip. What can I do in the bedroom?  Use night lights.  Make sure that you have a light by your bed that is easy to reach.  Do not use any  sheets or blankets that are too big for your bed. They should not hang down onto the floor.  Have a firm chair that has side arms. You can use this for support while you get dressed.  Do not have throw rugs and other things on the floor that can make you trip. What can I do in the kitchen?  Clean up any spills right away.  Avoid walking on wet floors.  Keep items that you use a lot in easy-to-reach places.  If you need to reach something above you, use a strong step stool that has a grab bar.  Keep electrical cords out of the way.  Do not use floor polish or wax that makes floors slippery. If you must use wax, use non-skid floor wax.  Do not have throw rugs and other things on the floor that can make you trip. What can I do with my stairs?  Do not leave any items on the stairs.  Make sure that there are handrails on both sides of the stairs and use them. Fix handrails that are broken or loose. Make sure that handrails  are as long as the stairways.  Check any carpeting to make sure that it is firmly attached to the stairs. Fix any carpet that is loose or worn.  Avoid having throw rugs at the top or bottom of the stairs. If you do have throw rugs, attach them to the floor with carpet tape.  Make sure that you have a light switch at the top of the stairs and the bottom of the stairs. If you do not have them, ask someone to add them for you. What else can I do to help prevent falls?  Wear shoes that:  Do not have high heels.  Have rubber bottoms.  Are comfortable and fit you well.  Are closed at the toe. Do not wear sandals.  If you use a stepladder:  Make sure that it is fully opened. Do not climb a closed stepladder.  Make sure that both sides of the stepladder are locked into place.  Ask someone to hold it for you, if possible.  Clearly mark and make sure that you can see:  Any grab bars or handrails.  First and last steps.  Where the edge of each step is.  Use tools that help you move around (mobility aids) if they are needed. These include:  Canes.  Walkers.  Scooters.  Crutches.  Turn on the lights when you go into a dark area. Replace any light bulbs as soon as they burn out.  Set up your furniture so you have a clear path. Avoid moving your furniture around.  If any of your floors are uneven, fix them.  If there are any pets around you, be aware of where they are.  Review your medicines with your doctor. Some medicines can make you feel dizzy. This can increase your chance of falling. Ask your doctor what other things that you can do to help prevent falls. This information is not intended to replace advice given to you by your health care provider. Make sure you discuss any questions you have with your health care provider. Document Released: 09/01/2009 Document Revised: 04/12/2016 Document Reviewed: 12/10/2014 Elsevier Interactive Patient Education  2017 [employer].

## 2021-03-24 NOTE — Progress Notes (Addendum)
Subjective:   Jocelyn Elliott is a 87 y.o. female who presents for Medicare Annual (Subsequent) preventive examination.  Review of Systems    No ROS.  Medicare Wellness Virtual Visit.   Cardiac Risk Factors include: advanced age (>74mn, >>16women);diabetes mellitus;hypertension     Objective:    Today's Vitals   03/24/21 1041  BP: (!) 104/50  Temp: (!) 83 F (28.3 C)  Weight: 134 lb (60.8 kg)  Height: 4' 11" (1.499 m)   Body mass index is 27.06 kg/m.  Advanced Directives 03/24/2021 12/26/2020 07/12/2020 03/16/2020 02/04/2020 01/20/2020 09/16/2019  Does Patient Have a Medical Advance Directive? Yes Yes Yes No Yes Yes -  Type of AParamedicof [place] will Living will [place]Living will - [place] will Living will  Does patient want to make changes to medical advance directive? No - Patient declined No - Patient declined No - Patient declined - No - Patient declined No - Patient declined -  Copy of [place] Chart? No - copy requested No - copy requested - - No - copy requested No - copy requested -  Would patient like information on creating a medical advance directive? - - - - - - -    Current Medications (verified) Outpatient Encounter Medications as of 03/24/2021  Medication Sig  . aspirin 81 MG tablet Take 81 mg by mouth daily.  . Biotin 5000 MCG CAPS Take 1 capsule by mouth daily.  . blood glucose meter kit and supplies KIT Dispense based on insurance preference. Use daily to check sugars once daily. Dx e11.9  . cetirizine (ZYRTEC) 10 MG tablet Take 10 mg by mouth daily.  . clopidogrel (PLAVIX) 75 MG tablet TAKE 1 TABLET BY MOUTH EVERY DAY  . ferrous sulfate 325 (65 FE) MG EC tablet Take 325 mg by mouth daily with breakfast. Every other day  . isosorbide mononitrate (IMDUR) 30 MG 24 hr tablet Take 30 mg by mouth daily.   . Lancets (ONETOUCH DELICA  PLUS [id number] MISC USE AS INSTRUCTED TO CHECK BLOOD SUGARS ONCE DAILY. DX E 11.9  . metFORMIN (GLUCOPHAGE) 500 MG tablet TAKE 1 TABLET BY MOUTH EVERY DAY WITH BREAKFAST  . Omega-3 Fatty Acids (FISH OIL) 1200 MG CAPS Take 1 capsule by mouth daily.  .Glory RosebushULTRA test strip USE AS INSTRUCTED TO CHECK BLOOD SUGARS ONCE DAILY. DX E11.9  . rosuvastatin (CRESTOR) 40 MG tablet TAKE 1 TABLET BY MOUTH EVERY DAY  . traZODone (DESYREL) 50 MG tablet Take 0.5-1 tablets (25-50 mg total) by mouth at bedtime as needed for sleep.   No facility-administered encounter medications on file as of 03/24/2021.    Allergies (verified) Ciprofloxacin, Levaquin [levofloxacin], Propofol, and Tequin [gatifloxacin]   History: Past Medical History:  Diagnosis Date  . Arthritis   . AV malformation of gastrointestinal tract   . Blood in stool   . CAD (coronary artery disease)   . Carotid arterial disease ([place]   . Cataracts, bilateral   . Chronic blood loss anemia   . Chronic cystitis   . Complication of anesthesia    reaction to propofol  . Depression   . Diabetes mellitus ([place]   . GERD (gastroesophageal reflux disease)   . Hyperlipidemia   . Hypertension   . IDA (iron deficiency anemia)   . Stress incontinence    Past Surgical History:  Procedure Laterality Date  . ABDOMINAL HYSTERECTOMY  1972  ovaries left in place  . APPENDECTOMY  1992  . BACK SURGERY  06/08/2010   L3, L4, L5  . CAROTID ARTERY ANGIOPLASTY  11/03  . [place], 92   right then left   . CHOLECYSTECTOMY  1993  . COLONOSCOPY WITH PROPOFOL N/A 02/02/2019   Procedure: COLONOSCOPY WITH PROPOFOL;  Surgeon: Manya Silvas, MD;  Location: [facility]COPY;  Service: Endoscopy;  Laterality: N/A;  . ESOPHAGOGASTRODUODENOSCOPY N/A 02/02/2019   Procedure: ESOPHAGOGASTRODUODENOSCOPY (EGD);  Surgeon: Manya Silvas, MD;  Location: [facility] ENDOSCOPY;  Service: Endoscopy;  Laterality: N/A;  . FOOT SURGERY  06/2007   Left  .  right carotid artery surgery  01/09/13   Dr. Delana Meyer @ [facility]  . TOTAL HIP ARTHROPLASTY  05/03/2011   left 12, right 11/07   Family History  Problem Relation Age of Onset  . Hyperlipidemia Father   . Heart disease Father        myocardial infarction  . Hypertension Father        Parent  . Arthritis Other        parent  . Diabetes Other        nephew  . Cervical cancer Sister   . Rectal cancer Sister   . Breast cancer Neg Hx    Social History   Socioeconomic History  . Marital status: Widowed    Spouse name: Not on file  . Number of children: 1  . Years of education: 48  . Highest education level: Not on file  Occupational History  . Occupation: Retired  Tobacco Use  . Smoking status: Former Smoker    Types: Cigarettes    Quit date: 11/19/1989    Years since quitting: 31.3  . Smokeless tobacco: Never Used  Substance and Sexual Activity  . Alcohol use: No    Alcohol/week: 0.0 standard drinks  . Drug use: No  . Sexual activity: Not Currently  Other Topics Concern  . Not on file  Social History Narrative   Regular exercise-no   Caffeine Use-yes   Social Determinants of Health   Financial Resource Strain: Low Risk   . Difficulty of Paying Living Expenses: Not hard at all  Food Insecurity: No Food Insecurity  . Worried About Charity fundraiser in the Last Year: Never true  . Ran Out of Food in the Last Year: Never true  Transportation Needs: No Transportation Needs  . Lack of Transportation (Medical): No  . Lack of Transportation (Non-Medical): No  Physical Activity: Not on file  Stress: No Stress Concern Present  . Feeling of Stress : Not at all  Social Connections: Unknown  . Frequency of Communication with Friends and Family: More than three times a week  . Frequency of Social Gatherings with Friends and Family: Three times a week  . Attends Religious Services: Not on file  . Active Member of Clubs or Organizations: Not on file  . Attends Theatre manager Meetings: Not on file  . Marital Status: Widowed    Tobacco Counseling Counseling given: Not Answered   Clinical Intake:  Pre-visit preparation completed: Yes        Diabetes: Yes (Followed by pcp)  How often do you need to have someone help you when you read instructions, pamphlets, or other written materials from your doctor or pharmacy?: 1 - Never  Nutrition Risk Assessment: Has the patient had any N/V/D within the last 2 months?  No  Does the patient have any non-healing  wounds?  No  Has the patient had any unintentional weight loss or weight gain?  No   Diabetes: If diabetic, was a CBG obtained today?  Yes FBS 112 Did the patient bring in their glucometer from home?  No  How often do you monitor your CBG's? Daily.   Financial Strains and Diabetes Management: Are you having any financial strains with the device, your supplies or your medication? No .  Does the patient want to be seen by Chronic Care Management for management of their diabetes?  No  Would the patient like to be referred to a Nutritionist or for Diabetic Management?  No   Interpreter Needed?: No      Activities of Daily Living In your present state of health, do you have any difficulty performing the following activities: 03/24/2021  Hearing? N  Vision? N  Difficulty concentrating or making decisions? N  Walking or climbing stairs? Y  Dressing or bathing? N  Doing errands, shopping? Y  Comment She does not Physiological scientist and eating ? Y  Comment Daughter assist with meal prep. Self feeds.  Using the Toilet? N  In the past six months, have you accidently leaked urine? N  Do you have problems with loss of bowel control? N  Managing your Medications? N  Managing your Finances? N  Housekeeping or managing your Housekeeping? Y  Comment Daughter assist  Some recent data might be hidden    Patient Care Team: Einar Pheasant, MD as PCP - General (Internal Medicine) Einar Pheasant, MD (Internal Medicine) Isaias Cowman, MD (Internal Medicine) Albesa Seen, MD (Unknown Physician Specialty) Brendolyn Patty, MD (Specialist) Schnier, Dolores Lory, MD (Vascular Surgery) Leia Alf, MD (Inactive) as Referring Physician (Internal Medicine) Isaias Cowman, MD (Internal Medicine) Albesa Seen, MD (Unknown Physician Specialty) Philis Kendall, MD (Unknown Physician Specialty) Brendolyn Patty, MD (Specialist) Delana Meyer, Dolores Lory, MD (Vascular Surgery)  Indicate any recent Medical Services you may have received from other than [facility] providers in the past year (date may be approximate).     Assessment:   This is a routine wellness examination for Katherine.  I connected with Charlita today by telephone and verified that I am speaking with the correct person using two identifiers. Location patient: home Location provider: work Persons participating in the virtual visit: patient, Marine scientist.    I discussed the limitations, risks, security and privacy concerns of performing an evaluation and management service by telephone and the availability of in person appointments. The patient expressed understanding and verbally consented to this telephonic visit.    Interactive audio and video telecommunications were attempted between this provider and patient, however failed, due to patient having technical difficulties OR patient did not have access to video capability.  We continued and completed visit with audio only.  Some vital signs may be absent or patient reported.   Hearing/Vision screen  Hearing Screening   125Hz 250Hz 500Hz 1000Hz 2000Hz 3000Hz 4000Hz 6000Hz 8000Hz  Right ear:           Left ear:           Comments: Patient is able to hear conversational tones without difficulty.  No issues reported.    Vision Screening Comments: Wears corrective lenses No retinopathy reported. Visual acuity not assessed, virtual visit.  They have seen their  ophthalmologist in the last 12 months.     Dietary issues and exercise activities discussed: Current Exercise Habits: Structured exercise class, Time (Minutes): 30, Frequency (Times/Week): 2, Weekly Exercise (Minutes/Week):  60, Intensity: Mild  Monitors sugar intake Good water intake  Goals Addressed            This Visit's Progress   . Follow up with Primary Care Provider       As needed      Depression Screen PHQ 2/9 Scores 03/24/2021 02/07/2021 02/04/2020 11/08/2016 09/20/2015 06/08/2014 05/31/2013  PHQ - 2 Score 0 1 0 0 0 0 0    Fall Risk Fall Risk  03/24/2021 02/04/2020 08/05/2019 10/06/2018 11/08/2016  Falls in the past year? 0 0 0 0 No  Number falls in past yr: 0 - - - -  Injury with Fall? 0 - - - -  Risk for fall due to : - Impaired balance/gait - - -  Follow up Falls evaluation completed Falls prevention discussed - - -    FALL RISK PREVENTION PERTAINING TO THE HOME: Handrails in use when climbing stairs? Yes Home free of loose throw rugs in walkways, pet beds, electrical cords, etc? Yes  Adequate lighting in your home to reduce risk of falls? Yes   ASSISTIVE DEVICES UTILIZED TO PREVENT FALLS: Life alert? No  Use of a cane, walker or w/c? Yes  Grab bars in the bathroom? Yes  Shower chair or bench in shower? Yes    Elevated toilet seat or a handicapped toilet? Yes   TIMED UP AND GO: Was the test performed? No . Virtual visit.   Cognitive Function: Patient is alert and oriented x3.  Denies focusing, making decisions, memory loss.  Enjoys reading, socializing and other brain health activities.  MMSE/6CIT deferred. Normal by direct communication/observation.      6CIT Screen 03/24/2021 02/04/2020 11/08/2016  What Year? 0 points 0 points 0 points  What month? 0 points 0 points 0 points  What time? 0 points 0 points 0 points  Count back from 20 0 points 0 points 0 points  Months in reverse 0 points 0 points 0 points    Immunizations Immunization History   Administered Date(s) Administered  . Fluad Quad(high Dose 65+) 07/28/2019, 10/03/2020  . Influenza, High Dose Seasonal PF 11/08/2016, 09/26/2017, 10/14/2018  . Influenza,inj,Quad PF,6+ Mos 10/07/2013, 07/18/2015  . PFIZER(Purple Top)SARS-COV-2 Vaccination 01/15/2020, 02/10/2020, 11/01/2020  . Pneumococcal Conjugate-13 09/26/2017  . Pneumococcal Polysaccharide-23 09/24/2007    TDAP status: Due, Education has been provided regarding the importance of this vaccine. Advised may receive this vaccine at local pharmacy or Health Dept. Aware to provide a copy of the vaccination record if obtained from local pharmacy or Health Dept. Verbalized acceptance and understanding. Deferred.   Health Maintenance Health Maintenance  Topic Date Due  . MAMMOGRAM  09/15/2020  . URINE MICROALBUMIN  02/24/2021  . DEXA SCAN  03/24/2022 (Originally 06/13/1999)  . TETANUS/TDAP  03/24/2022 (Originally 06/12/1951)  . INFLUENZA VACCINE  06/19/2021  . HEMOGLOBIN A1C  08/10/2021  . FOOT EXAM  10/03/2021  . OPHTHALMOLOGY EXAM  01/02/2022  . COVID-19 Vaccine  Completed  . PNA vac Low Risk Adult  Completed  . HPV VACCINES  Aged Out   Colorectal cancer screening: No longer required.   Mammogram status: Completed 09/16/19. Repeat every year . Ordered 10/03/20.  Bone density- declined.   Lung Cancer Screening: (Low Dose CT Chest recommended if Age 18-80 years, 30 pack-year currently smoking OR have quit w/in 15years.) does not qualify.   Hepatitis C Screening: does not qualify.  Vision Screening: Recommended annual ophthalmology exams for early detection of glaucoma and other disorders of the eye. Is the patient  up to date with their annual eye exam?  Yes   Dental Screening: Recommended annual dental exams for proper oral hygiene. Dentures.  Community Resource Referral / Chronic Care Management: CRR required this visit?  No   CCM required this visit?  No      Plan:   Keep all routine maintenance  appointments.   Follow up 04/04/21 @ 11:00  I have personally reviewed and noted the following in the patient's chart:   . Medical and social history . Use of alcohol, tobacco or illicit drugs  . Current medications and supplements including opioid. Patient is not currently taking opioids prescriptions.  . Functional ability and status . Nutritional status . Physical activity . Advanced directives . List of other physicians . Hospitalizations, surgeries, and ER visits in previous 12 months . Vitals . Screenings to include cognitive, depression, and falls . Referrals and appointments  In addition, I have reviewed and discussed with patient certain preventive protocols, quality metrics, and best practice recommendations. A written personalized care plan for preventive services as well as general preventive health recommendations were provided to patient.     Varney Biles, LPN   0/03/3975

## 2021-03-27 ENCOUNTER — Inpatient Hospital Stay (HOSPITAL_BASED_OUTPATIENT_CLINIC_OR_DEPARTMENT_OTHER): Payer: Medicare Other | Admitting: Internal Medicine

## 2021-03-27 ENCOUNTER — Ambulatory Visit: Payer: Medicare Other

## 2021-03-27 ENCOUNTER — Inpatient Hospital Stay: Payer: Medicare Other

## 2021-03-27 ENCOUNTER — Other Ambulatory Visit: Payer: Self-pay

## 2021-03-27 ENCOUNTER — Inpatient Hospital Stay: Payer: Medicare Other | Attending: Internal Medicine

## 2021-03-27 VITALS — BP 110/85 | HR 63

## 2021-03-27 DIAGNOSIS — R2681 Unsteadiness on feet: Secondary | ICD-10-CM

## 2021-03-27 DIAGNOSIS — D508 Other iron deficiency anemias: Secondary | ICD-10-CM

## 2021-03-27 DIAGNOSIS — D631 Anemia in chronic kidney disease: Secondary | ICD-10-CM

## 2021-03-27 DIAGNOSIS — Z7902 Long term (current) use of antithrombotics/antiplatelets: Secondary | ICD-10-CM | POA: Insufficient documentation

## 2021-03-27 DIAGNOSIS — N183 Chronic kidney disease, stage 3 unspecified: Secondary | ICD-10-CM

## 2021-03-27 DIAGNOSIS — G459 Transient cerebral ischemic attack, unspecified: Secondary | ICD-10-CM | POA: Diagnosis not present

## 2021-03-27 DIAGNOSIS — R2689 Other abnormalities of gait and mobility: Secondary | ICD-10-CM | POA: Diagnosis not present

## 2021-03-27 DIAGNOSIS — M6281 Muscle weakness (generalized): Secondary | ICD-10-CM

## 2021-03-27 LAB — BASIC METABOLIC PANEL
Anion gap: 10 (ref 5–15)
BUN: 24 mg/dL — ABNORMAL HIGH (ref 8–23)
CO2: 23 mmol/L (ref 22–32)
Calcium: 9.3 mg/dL (ref 8.9–10.3)
Chloride: 106 mmol/L (ref 98–111)
Creatinine, Ser: 1.46 mg/dL — ABNORMAL HIGH (ref 0.44–1.00)
GFR, Estimated: 35 mL/min — ABNORMAL LOW (ref >60)
Glucose, Bld: 151 mg/dL — ABNORMAL HIGH (ref 70–99)
Potassium: 3.9 mmol/L (ref 3.5–5.1)
Sodium: 139 mmol/L (ref 135–145)

## 2021-03-27 LAB — CBC WITH DIFFERENTIAL/PLATELET
Abs Immature Granulocytes: 0.02 10*3/uL (ref 0.00–0.07)
Basophils Absolute: 0.1 10*3/uL (ref 0.0–0.1)
Basophils Relative: 1 %
Eosinophils Absolute: 0.2 10*3/uL (ref 0.0–0.5)
Eosinophils Relative: 3 %
HCT: 36 % (ref 36.0–46.0)
Hemoglobin: 11.7 g/dL — ABNORMAL LOW (ref 12.0–15.0)
Immature Granulocytes: 0 %
Lymphocytes Relative: 25 %
Lymphs Abs: 2 10*3/uL (ref 0.7–4.0)
MCH: 33.3 pg (ref 26.0–34.0)
MCHC: 32.5 g/dL (ref 30.0–36.0)
MCV: 102.6 fL — ABNORMAL HIGH (ref 80.0–100.0)
Monocytes Absolute: 0.4 10*3/uL (ref 0.1–1.0)
Monocytes Relative: 4 %
Neutro Abs: 5.4 10*3/uL (ref 1.7–7.7)
Neutrophils Relative %: 67 %
Platelets: 168 10*3/uL (ref 150–400)
RBC: 3.51 MIL/uL — ABNORMAL LOW (ref 3.87–5.11)
RDW: 13.1 % (ref 11.5–15.5)
WBC: 8.1 10*3/uL (ref 4.0–10.5)
nRBC: 0 % (ref 0.0–0.2)

## 2021-03-27 MED ORDER — SODIUM CHLORIDE 0.9 % IV SOLN
510.0000 mg | Freq: Once | INTRAVENOUS | Status: AC
  Administered 2021-03-27: 510 mg via INTRAVENOUS
  Filled 2021-03-27: qty 510

## 2021-03-27 MED ORDER — SODIUM CHLORIDE 0.9 % IV SOLN
Freq: Once | INTRAVENOUS | Status: AC
Start: 2021-03-27 — End: 2021-03-27
  Filled 2021-03-27: qty 250

## 2021-03-27 NOTE — Patient Instructions (Signed)
Ferumoxytol injection What is this medicine? FERUMOXYTOL is an iron complex. Iron is used to make healthy red blood cells, which carry oxygen and nutrients throughout the body. This medicine is used to treat iron deficiency anemia. This medicine may be used for other purposes; ask your health care provider or pharmacist if you have questions. COMMON BRAND NAME(S): Feraheme What should I tell my health care provider before I take this medicine? They need to know if you have any of these conditions:  anemia not caused by low iron levels  high levels of iron in the blood  magnetic resonance imaging (MRI) test scheduled  an unusual or allergic reaction to iron, other medicines, foods, dyes, or preservatives  pregnant or trying to get pregnant  breast-feeding How should I use this medicine? This medicine is for injection into a vein. It is given by a health care professional in a hospital or clinic setting. Talk to your pediatrician regarding the use of this medicine in children. Special care may be needed. Overdosage: If you think you have taken too much of this medicine contact a poison control center or emergency room at once. NOTE: This medicine is only for you. Do not share this medicine with others. What if I miss a dose? It is important not to miss your dose. Call your doctor or health care professional if you are unable to keep an appointment. What may interact with this medicine? This medicine may interact with the following medications:  other iron products This list may not describe all possible interactions. Give your health care provider a list of all the medicines, herbs, non-prescription drugs, or dietary supplements you use. Also tell them if you smoke, drink alcohol, or use illegal drugs. Some items may interact with your medicine. What should I watch for while using this medicine? Visit your doctor or healthcare professional regularly. Tell your doctor or healthcare  professional if your symptoms do not start to get better or if they get worse. You may need blood work done while you are taking this medicine. You may need to follow a special diet. Talk to your doctor. Foods that contain iron include: whole grains/cereals, dried fruits, beans, or peas, leafy green vegetables, and organ meats (liver, kidney). What side effects may I notice from receiving this medicine? Side effects that you should report to your doctor or health care professional as soon as possible:  allergic reactions like skin rash, itching or hives, swelling of the face, lips, or tongue  breathing problems  changes in blood pressure  feeling faint or lightheaded, falls  fever or chills  flushing, sweating, or hot feelings  swelling of the ankles or feet Side effects that usually do not require medical attention (report to your doctor or health care professional if they continue or are bothersome):  diarrhea  headache  nausea, vomiting  stomach pain This list may not describe all possible side effects. Call your doctor for medical advice about side effects. You may report side effects to FDA at 1-800-FDA-1088. Where should I keep my medicine? This drug is given in a hospital or clinic and will not be stored at home. NOTE: This sheet is a summary. It may not cover all possible information. If you have questions about this medicine, talk to your doctor, pharmacist, or health care provider.  2021 Elsevier/Gold Standard (2016-12-24 20:21:10)  

## 2021-03-27 NOTE — Therapy (Signed)
[facility] [address]. [place], [place], [address] Phone: [phone]   Fax:  [phone]  Physical Therapy Treatment  Patient Details  Name: Jocelyn Elliott MRN: [id number] Date of Birth: 1932/07/10 Referring Provider (PT): Dr. Manuella Ghazi   Encounter Date: 03/27/2021   PT End of Session - 03/27/21 1200    Visit Number 4    Number of Visits 12    Date for PT Re-Evaluation 04/25/21    Authorization Type Medicare    Authorization Time Period 10 visits, eval 03/14/21    Authorization - Visit Number 4    Authorization - Number of Visits 10    Progress Note Due on Visit 10    PT Start Time 1030    PT Stop Time 1115    PT Time Calculation (min) 45 min    Equipment Utilized During Treatment Gait belt    Activity Tolerance Patient tolerated treatment well    Behavior During Therapy [facility] for tasks assessed/performed           Past Medical History:  Diagnosis Date  . Arthritis   . AV malformation of gastrointestinal tract   . Blood in stool   . CAD (coronary artery disease)   . Carotid arterial disease ([place])   . Cataracts, bilateral   . Chronic blood loss anemia   . Chronic cystitis   . Complication of anesthesia    reaction to propofol  . Depression   . Diabetes mellitus ([place])   . GERD (gastroesophageal reflux disease)   . Hyperlipidemia   . Hypertension   . IDA (iron deficiency anemia)   . Stress incontinence     Past Surgical History:  Procedure Laterality Date  . ABDOMINAL HYSTERECTOMY  1972   ovaries left in place  . APPENDECTOMY  1992  . BACK SURGERY  06/08/2010   L3, L4, L5  . CAROTID ARTERY ANGIOPLASTY  11/03  . [place], 92   right then left   . CHOLECYSTECTOMY  1993  . COLONOSCOPY WITH PROPOFOL N/A 02/02/2019   Procedure: COLONOSCOPY WITH PROPOFOL;  Surgeon: Manya Silvas, MD;  Location: [facility] ENDOSCOPY;  Service: Endoscopy;  Laterality: N/A;  . ESOPHAGOGASTRODUODENOSCOPY N/A 02/02/2019    Procedure: ESOPHAGOGASTRODUODENOSCOPY (EGD);  Surgeon: Manya Silvas, MD;  Location: [facility] ENDOSCOPY;  Service: Endoscopy;  Laterality: N/A;  . FOOT SURGERY  06/2007   Left  . right carotid artery surgery  01/09/13   Dr. Delana Meyer @ [facility]  . TOTAL HIP ARTHROPLASTY  05/03/2011   left 12, right 11/07    There were no vitals filed for this visit.   Subjective Assessment - 03/27/21 1030    Subjective Pt reports she was careful this weekend when she had some young grandkids at her house.  She did not lose her balance with the children running around.    Pertinent History L THA in 2012, R THA 2007.    Patient Stated Goals to improve balance, reduce falls               Treatment Today:  Neuro re-ed: -in parallel bars: on airex feet together EO head turns R/L 2x15, look up/down 2x15 (occasional UE support to regain balance) -in parallel bars: on airex pt shift COM x10 ea laterally, forward, posterior to edge of limit of stability and then back to center- requires UE support in posterior direction, then reaching for cone in multidirections R UE and L UE  -stepping activities for  balance/coordination on blue floor ladder:  Forward/reverse "rock step" leading with R foot front/back and L foot front/back x20; "rock step" laterally away from base of support alternating R/L x20; stepping forward/reverse/laterally with PT cue which direction (so pt has to quickly process and respond and maintain balance) x 3 min (PT CGA with 1 hand hold support and pt in gait belt)   Therapeutic Exercise: -hurdles: forward stepping, side stepping (4 laps ea), cues for: focus on 1 hand support, slow and controlled pacing during exercise, also focused on hip flexion strategy for clearing hurdles and when pt reports L hip pain became limiting factor then transitioned to knee flexion strategy for clearing L foot. Discussed purpose of this exercise to practice stepping in/out of shower which has a very small  lip to clear.   -sit to stand: with use of UE on thighs 2x10,cues for: pacing/control/body mechanics (scoot hips forward before standing) and not using momentum  -obstacle course (with SPC, PT CGA): sit to stand, walk, step over obstacle, 360 deg turn around obstacle, pass through busy 4-way hallway intersection, step up on/off airex pad, walk and 180 degree turn.  Then reversed the entire sequence to complete the course.  2 full laps completed.  -body mechanics/transfer training: pt ed for safety awareness when ambulating through hallway intersections using head turn and eye scan to assess for any oncoming moving obstacles        PT Education - 03/27/21 1200    Education Details exercise technique/form    Person(s) Educated Patient    Methods Explanation;Demonstration;Tactile cues;Verbal cues    Comprehension Verbalized understanding;Returned demonstration;Need further instruction            PT Short Term Goals - 03/14/21 1258      PT SHORT TERM GOAL #1   Title Pt will demonstrate ability to safely step laterally over small hurdle using 1 rail (to simulate stepping in/out of shower) to promote independence with showering    Baseline 03/14/21- pt reports she takes sponge baths because she is fearful of falling    Time 3    Period Weeks    Status New    Target Date 04/04/21             PT Long Term Goals - 03/16/21 1214      PT LONG TERM GOAL #1   Title pt will be able to ambulate independently transfer sit to stand (and stand to sit) in and out of passenger side of car and in/out of living room chair without loss of balance    Baseline 03/14/21- pt reports having difficulty with transfers from sit to stand- this is when she loses her balance at home    Time 6    Period Weeks    Status New    Target Date 04/25/21      PT LONG TERM GOAL #2   Title Pt will be able to perform 10 minutes of light housework (sweep kitchen floor) without loss of balance    Baseline  03/14/21- pt reports avoiding cleaning/housework because of her balance    Time 6    Period Weeks    Status New    Target Date 04/25/21      PT LONG TERM GOAL #3   Title Improve FOTO to 48 indicating improved functional ability at home without being limited by balance    Baseline 03/14/21- 43    Time 6    Period Weeks    Status New  Target Date 04/25/21      PT LONG TERM GOAL #4   Title Improve ABC-S scale by at least 13% indicating a clinical significant change in balance confidence    Baseline 03/14/21: 15/45 (33%)    Time 6    Period Weeks    Status New    Target Date 04/25/21                 Plan - 03/27/21 1200    Clinical Impression Statement Pt's c/o hip pain L>R continues to influence her ability to perform standing hip flexion and abduction movements during balance and strengthening exercises today.  She also has difficulty with safety awareness and multistep instructions during obstacle course and requires verbal cues each time for head turn R/L to check for any moving obstacles at busy hallway intersection.  She should cont to benefit from skilled PT to improve balance, strength, and safety with mobility to facilitate reduced fall risk.    Personal Factors and Comorbidities Age;Comorbidity 3+    Comorbidities h/o multiple THA, lumbar spine surgery, TIA (affected hand writing, speech, balance)    Examination-Activity Limitations Bathing;Bend;Lift;Locomotion Level;Transfers;Stairs    Examination-Participation Restrictions Community Activity;Cleaning;Laundry    Stability/Clinical Decision Making Evolving/Moderate complexity    Rehab Potential Good    PT Frequency 2x / week    PT Duration 6 weeks    PT Next Visit Plan BERG, continue with dynamc balance exercises and hip strengthening    PT Home Exercise Plan Medbridge : [email or web address]    Consulted and Agree with Plan of Care Patient;Family member/caregiver    Family Member Consulted daughter Shirlean Mylar)           Patient  will benefit from skilled therapeutic intervention in order to improve the following deficits and impairments:  Abnormal gait,Difficulty walking,Decreased range of motion,Decreased endurance,Pain,Decreased activity tolerance,Improper body mechanics,Decreased balance,Decreased strength,Decreased mobility  Visit Diagnosis: Imbalance  Muscle weakness (generalized)  Unsteadiness on feet     Problem List Patient Active Problem List   Diagnosis Date Noted  . Difficulty with speech 02/12/2021  . Cough 12/10/2020  . Trigger finger, right ring finger 03/05/2020  . Anemia of chronic kidney failure, stage 3 (moderate) ([place]) 07/20/2019  . Chest pain 04/17/2019  . Bradycardia 02/02/2019  . Afib ([place]) 02/02/2019  . CKD (chronic kidney disease) stage 3, GFR 30-59 ml/min (HCC) 10/18/2018  . Rash 06/14/2018  . SI (sacroiliac) joint dysfunction 01/07/2018  . Dizziness 05/22/2017  . History of total left hip replacement 05/17/2017  . Trochanteric bursitis of left hip 05/17/2017  . Iron deficiency anemia due to chronic blood loss 09/20/2016  . Hypotension 04/06/2016  . Respiratory infection 04/06/2016  . Other specified respiratory disorders 04/06/2016  . Health care maintenance 10/28/2015  . 2-vessel coronary artery disease 09/22/2015  . SOB (shortness of breath) on exertion 09/20/2015  . Knee pain, bilateral 09/20/2015  . Sleeping difficulty 07/24/2015  . Hepatomegaly 07/24/2015  . Sleep disorder 07/24/2015  . Hepatomegaly, not elsewhere classified 07/24/2015  . Generalized anxiety disorder 04/23/2015  . UTI (urinary tract infection) 05/22/2014  . Hip pain 10/11/2013  . Pain in hip 10/11/2013  . Tendonitis 07/19/2013  . Leg numbness 04/07/2013  . CAD (coronary artery disease) 09/05/2012  . Hypertension 09/05/2012  . Hypercholesteremia 09/05/2012  . Diabetes mellitus ([place]) 09/05/2012  . Carotid arterial disease ([place]) 09/05/2012  . Normocytic anemia 09/05/2012  . Disorder of artery or  arteriole ([place]) 09/05/2012  . Essential (primary) hypertension 09/05/2012  . Anemia 09/05/2012  Pincus Badder 03/27/2021, 12:11 PM Merdis Delay, PT, DPT    [facility] [address] [place], [place], [address] Phone: [phone]   Fax:  [phone]  Name: SHARANYA ORIANS MRN: [id number] Date of Birth: [birth year]

## 2021-03-27 NOTE — Progress Notes (Signed)
[facility] OFFICE PROGRESS NOTE  Patient Care Team: Einar Pheasant, MD as PCP - General (Internal Medicine) Einar Pheasant, MD (Internal Medicine) Isaias Cowman, MD (Internal Medicine) Albesa Seen, MD (Unknown Physician Specialty) Brendolyn Patty, MD (Specialist) Schnier, Dolores Lory, MD (Vascular Surgery) Leia Alf, MD (Inactive) as Referring Physician (Internal Medicine) Isaias Cowman, MD (Internal Medicine) Albesa Seen, MD (Unknown Physician Specialty) Philis Kendall, MD (Unknown Physician Specialty) Brendolyn Patty, MD (Specialist) Schnier, Dolores Lory, MD (Vascular Surgery)   SUMMARY OF HEMATOLOGIC HISTORY:  # IRON DEFICIENCY ANEMIA- recurrent [? GI blood loss; Dr.Elliot; AVM-capsule]; IV Ferrahem q 61m; last colo- 2012/march; EGD- [id number].  July 2020 bone marrow biopsy-mild dysplastic changes; 40% hypercellularity; cytogenetics-normal.-Retacrit.  #Colonoscopy-April 2020 aborted because of cardiac pause.  # CKD stage III-IV; CAD-Dr. Saralyn Pilar;  March 30th, 2022-TIA/slurred spleech [Dr.Shah]  INTERVAL HISTORY:   87 -year-old female patient with above history of recurrent iron deficiency anemia unclear etiology -related to CKD is here for follow-up.  TIA/slurred spleech- March 30th, 2022.  Patient evaluated by Dr. Manuella Ghazi currently undergoing physical therapy.  No blood in stools no blood in the stools.   Review of Systems  Constitutional: Positive for malaise/fatigue. Negative for chills, diaphoresis, fever and weight loss.  HENT: Negative for nosebleeds and sore throat.   Eyes: Negative for double vision.  Respiratory: Negative for cough, hemoptysis, sputum production and wheezing.   Cardiovascular: Negative for palpitations, orthopnea and leg swelling.  Gastrointestinal: Negative for abdominal pain, blood in stool, constipation, diarrhea, heartburn, melena, nausea and vomiting.  Genitourinary: Negative for dysuria, frequency and urgency.   Musculoskeletal: Negative for back pain and joint pain.  Skin: Negative.  Negative for itching and rash.  Neurological: Negative for dizziness, tingling, focal weakness, weakness and headaches.  Endo/Heme/Allergies: Does not bruise/bleed easily.  Psychiatric/Behavioral: Negative for depression. The patient is not nervous/anxious and does not have insomnia.      PAST MEDICAL HISTORY :  Past Medical History:  Diagnosis Date  . Arthritis   . AV malformation of gastrointestinal tract   . Blood in stool   . CAD (coronary artery disease)   . Carotid arterial disease ([place])   . Cataracts, bilateral   . Chronic blood loss anemia   . Chronic cystitis   . Complication of anesthesia    reaction to propofol  . Depression   . Diabetes mellitus ([place])   . GERD (gastroesophageal reflux disease)   . Hyperlipidemia   . Hypertension   . IDA (iron deficiency anemia)   . Stress incontinence     PAST SURGICAL HISTORY :   Past Surgical History:  Procedure Laterality Date  . ABDOMINAL HYSTERECTOMY  1972   ovaries left in place  . APPENDECTOMY  1992  . BACK SURGERY  06/08/2010   L3, L4, L5  . CAROTID ARTERY ANGIOPLASTY  11/03  . [place], 92   right then left   . CHOLECYSTECTOMY  1993  . COLONOSCOPY WITH PROPOFOL N/A 02/02/2019   Procedure: COLONOSCOPY WITH PROPOFOL;  Surgeon: Manya Silvas, MD;  Location: [facility] ENDOSCOPY;  Service: Endoscopy;  Laterality: N/A;  . ESOPHAGOGASTRODUODENOSCOPY N/A 02/02/2019   Procedure: ESOPHAGOGASTRODUODENOSCOPY (EGD);  Surgeon: Manya Silvas, MD;  Location: [facility] ENDOSCOPY;  Service: Endoscopy;  Laterality: N/A;  . FOOT SURGERY  06/2007   Left  . right carotid artery surgery  01/09/13   Dr. Delana Meyer @ [facility]  . TOTAL HIP ARTHROPLASTY  05/03/2011   left 12, right 11/07    FAMILY HISTORY :  Family History  Problem Relation Age of Onset  . Hyperlipidemia Father   . Heart disease Father        myocardial infarction  . Hypertension  Father        Parent  . Arthritis Other        parent  . Diabetes Other        nephew  . Cervical cancer Sister   . Rectal cancer Sister   . Breast cancer Neg Hx     SOCIAL HISTORY:   Social History   Tobacco Use  . Smoking status: Former Smoker    Types: Cigarettes    Quit date: 11/19/1989    Years since quitting: 31.3  . Smokeless tobacco: Never Used  Substance Use Topics  . Alcohol use: No    Alcohol/week: 0.0 standard drinks  . Drug use: No    ALLERGIES:  is allergic to ciprofloxacin, levaquin [levofloxacin], propofol, and tequin [gatifloxacin].  MEDICATIONS:  Current Outpatient Medications  Medication Sig Dispense Refill  . aspirin 81 MG tablet Take 81 mg by mouth daily.    . Biotin 5000 MCG CAPS Take 1 capsule by mouth daily.    . blood glucose meter kit and supplies KIT Dispense based on insurance preference. Use daily to check sugars once daily. Dx e11.9 1 each 0  . cetirizine (ZYRTEC) 10 MG tablet Take 10 mg by mouth daily.    . ferrous sulfate 325 (65 FE) MG EC tablet Take 325 mg by mouth daily with breakfast. Every other day    . isosorbide mononitrate (IMDUR) 30 MG 24 hr tablet Take 30 mg by mouth daily.     . Lancets (ONETOUCH DELICA PLUS [id number]) MISC USE AS INSTRUCTED TO CHECK BLOOD SUGARS ONCE DAILY. DX E 11.9 100 each 12  . metFORMIN (GLUCOPHAGE) 500 MG tablet TAKE 1 TABLET BY MOUTH EVERY DAY WITH BREAKFAST 90 tablet 1  . Omega-3 Fatty Acids (FISH OIL) 1200 MG CAPS Take 1 capsule by mouth daily.    Glory Rosebush ULTRA test strip USE AS INSTRUCTED TO CHECK BLOOD SUGARS ONCE DAILY. DX E11.9 100 strip 12  . rosuvastatin (CRESTOR) 40 MG tablet TAKE 1 TABLET BY MOUTH EVERY DAY 30 tablet 0  . traZODone (DESYREL) 50 MG tablet Take 0.5-1 tablets (25-50 mg total) by mouth at bedtime as needed for sleep. 90 tablet 1   No current facility-administered medications for this visit.    PHYSICAL EXAMINATION:   BP 125/60   Pulse 73   Temp (!) 96.6 F (35.9 C)  (Tympanic)   Resp 20   Ht 4' 11"  (1.499 m)   Wt 135 lb (61.2 kg)   BMI 27.27 kg/m   Filed Weights   03/27/21 1346  Weight: 135 lb (61.2 kg)    Physical Exam Constitutional:      Comments: [place] Caucasian female patient.  She is in a wheelchair.  HENT:     Head: Normocephalic and atraumatic.     Mouth/Throat:     Pharynx: No oropharyngeal exudate.  Eyes:     Pupils: Pupils are equal, round, and reactive to light.     Comments: Positive for pallor.  Cardiovascular:     Rate and Rhythm: Normal rate and regular rhythm.  Pulmonary:     Effort: No respiratory distress.     Breath sounds: No wheezing.  Abdominal:     General: Bowel sounds are normal. There is no distension.     Palpations: Abdomen is soft. There is no  mass.     Tenderness: There is no abdominal tenderness. There is no guarding or rebound.  Musculoskeletal:        General: No tenderness. Normal range of motion.     Cervical back: Normal range of motion and neck supple.  Skin:    General: Skin is warm.  Neurological:     Mental Status: She is alert and oriented to person, place, and time.  Psychiatric:        Mood and Affect: Affect normal.     LABORATORY DATA:  I have reviewed the data as listed    Component Value Date/Time   NA 139 03/27/2021 1321   NA 143 01/10/2013 0314   K 3.9 03/27/2021 1321   K 4.0 01/10/2013 0314   CL 106 03/27/2021 1321   CL 109 (H) 01/10/2013 0314   CO2 23 03/27/2021 1321   CO2 27 01/10/2013 0314   GLUCOSE 151 (H) 03/27/2021 1321   GLUCOSE 119 (H) 01/10/2013 0314   BUN 24 (H) 03/27/2021 1321   BUN 15 01/10/2013 0314   CREATININE 1.46 (H) 03/27/2021 1321   CREATININE 1.03 01/10/2013 0314   CREATININE 1.13 (H) 09/18/2012 0848   CALCIUM 9.3 03/27/2021 1321   CALCIUM 8.1 (L) 01/10/2013 0314   PROT 6.3 02/07/2021 1121   PROT 7.2 12/20/2011 1521   ALBUMIN 4.0 02/07/2021 1121   ALBUMIN 3.9 12/20/2011 1521   AST 29 02/07/2021 1121   AST 31 12/20/2011 1521   ALT  29 02/07/2021 1121   ALT 37 12/20/2011 1521   ALKPHOS 55 02/07/2021 1121   ALKPHOS 57 12/20/2011 1521   BILITOT 0.4 02/07/2021 1121   BILITOT 0.5 12/20/2011 1521   GFRNONAA 35 (L) 03/27/2021 1321   GFRNONAA 52 (L) 01/10/2013 0314   GFRNONAA 47 (L) 09/18/2012 0848   GFRAA 46 (L) 07/12/2020 1240   GFRAA >60 01/10/2013 0314   GFRAA 54 (L) 09/18/2012 0848    No results found for: SPEP, UPEP  Lab Results  Component Value Date   WBC 8.1 03/27/2021   NEUTROABS 5.4 03/27/2021   HGB 11.7 (L) 03/27/2021   HCT 36.0 03/27/2021   MCV 102.6 (H) 03/27/2021   PLT 168 03/27/2021      Chemistry      Component Value Date/Time   NA 139 03/27/2021 1321   NA 143 01/10/2013 0314   K 3.9 03/27/2021 1321   K 4.0 01/10/2013 0314   CL 106 03/27/2021 1321   CL 109 (H) 01/10/2013 0314   CO2 23 03/27/2021 1321   CO2 27 01/10/2013 0314   BUN 24 (H) 03/27/2021 1321   BUN 15 01/10/2013 0314   CREATININE 1.46 (H) 03/27/2021 1321   CREATININE 1.03 01/10/2013 0314   CREATININE 1.13 (H) 09/18/2012 0848      Component Value Date/Time   CALCIUM 9.3 03/27/2021 1321   CALCIUM 8.1 (L) 01/10/2013 0314   ALKPHOS 55 02/07/2021 1121   ALKPHOS 57 12/20/2011 1521   AST 29 02/07/2021 1121   AST 31 12/20/2011 1521   ALT 29 02/07/2021 1121   ALT 37 12/20/2011 1521   BILITOT 0.4 02/07/2021 1121   BILITOT 0.5 12/20/2011 1521         ASSESSMENT & PLAN:   Anemia of chronic kidney failure, stage 3 (moderate) (HCC) #Severe anemia hemoglobin-nadir 6 [summer 2020]. Likely CKD [July 2021- Bone marrow biopsy-mild dyserythropoietic changes].   #Hemoglobin today is  11.7; proceed with IV Feraheme today;  Continue PO iron/Vitron-C every other day AUG  021- Iron studies: Saturation29; ferritin 29; awaiting Iron studies/ferritin- today  # CKD stage III-1.5- GFR- 35 STABLE;  Recommend continued p.o. intake.   # TIA: on plavix [Dr.Shah]on PT.    # DISPOSITION:  # Ferrahem today.  # in 3 months- MD- labs-  cbc/bmp;possible Ferrahem-/ Dr.B       Cammie Sickle, MD 03/27/2021 2:17 PM

## 2021-03-27 NOTE — Assessment & Plan Note (Signed)
#  Severe anemia hemoglobin-nadir 6 [summer 2020]. Likely CKD [July 2021- Bone marrow biopsy-mild dyserythropoietic changes].   #Hemoglobin today is  11.7; proceed with IV Feraheme today;  Continue PO iron/Vitron-C every other day AUG 021- Iron studies: Saturation29; ferritin 29; awaiting Iron studies/ferritin- today  # CKD stage III-1.5- GFR- 35 STABLE;  Recommend continued p.o. intake.   # TIA: on plavix [Dr.Shah]on PT.    # DISPOSITION:  # Ferrahem today.  # in 3 months- MD- labs- cbc/bmp;possible Ferrahem-/ Dr.B

## 2021-03-29 ENCOUNTER — Other Ambulatory Visit: Payer: Self-pay

## 2021-03-29 ENCOUNTER — Ambulatory Visit: Payer: Medicare Other

## 2021-03-29 DIAGNOSIS — R2689 Other abnormalities of gait and mobility: Secondary | ICD-10-CM | POA: Diagnosis not present

## 2021-03-29 DIAGNOSIS — R2681 Unsteadiness on feet: Secondary | ICD-10-CM

## 2021-03-29 DIAGNOSIS — M6281 Muscle weakness (generalized): Secondary | ICD-10-CM

## 2021-03-29 NOTE — Therapy (Signed)
[facility] [address] [place], [place], [address] Phone: [phone]   Fax:  [phone]  Physical Therapy Treatment  Patient Details  Name: Jocelyn Elliott MRN: [id number] Date of Birth: 09/10/1932 Referring Provider (PT): Dr. Manuella Ghazi   Encounter Date: 03/29/2021   PT End of Session - 03/29/21 1137    Visit Number 5    Number of Visits 12    Date for PT Re-Evaluation 04/25/21    Authorization Type Medicare    Authorization Time Period 10 visits, eval 03/14/21    Authorization - Visit Number 5    Authorization - Number of Visits 10    Progress Note Due on Visit 10    PT Start Time [id number]    PT Stop Time 1100    PT Time Calculation (min) 45 min    Equipment Utilized During Treatment Gait belt    Activity Tolerance Patient tolerated treatment well    Behavior During Therapy [facility] for tasks assessed/performed           Past Medical History:  Diagnosis Date  . Arthritis   . AV malformation of gastrointestinal tract   . Blood in stool   . CAD (coronary artery disease)   . Carotid arterial disease ([place])   . Cataracts, bilateral   . Chronic blood loss anemia   . Chronic cystitis   . Complication of anesthesia    reaction to propofol  . Depression   . Diabetes mellitus ([place])   . GERD (gastroesophageal reflux disease)   . Hyperlipidemia   . Hypertension   . IDA (iron deficiency anemia)   . Stress incontinence     Past Surgical History:  Procedure Laterality Date  . ABDOMINAL HYSTERECTOMY  1972   ovaries left in place  . APPENDECTOMY  1992  . BACK SURGERY  06/08/2010   L3, L4, L5  . CAROTID ARTERY ANGIOPLASTY  11/03  . [place], 92   right then left   . CHOLECYSTECTOMY  1993  . COLONOSCOPY WITH PROPOFOL N/A 02/02/2019   Procedure: COLONOSCOPY WITH PROPOFOL;  Surgeon: Manya Silvas, MD;  Location: [facility] ENDOSCOPY;  Service: Endoscopy;  Laterality: N/A;  . ESOPHAGOGASTRODUODENOSCOPY N/A 02/02/2019    Procedure: ESOPHAGOGASTRODUODENOSCOPY (EGD);  Surgeon: Manya Silvas, MD;  Location[facility] ENDOSCOPY;  Service: Endoscopy;  Laterality: N/A;  . FOOT SURGERY  06/2007   Left  . right carotid artery surgery  01/09/13   Dr. Delana Meyer @ [facility]  . TOTAL HIP ARTHROPLASTY  05/03/2011   left 12, right 11/07    There were no vitals filed for this visit.   Subjective Assessment - 03/29/21 1018    Subjective Pt reports she had on falls since last PT session.  She had to stand for ~35 minutes waiting for a table at a restaurant after PT and she managed to do this.  Pt practiced getting in and out of shower fully clothed to work on building confidence with this activity.  She is having difficulty with stepping backwards, she loses her balance if she has to move or step backwards.    Pertinent History L THA in 2012, R THA 2007.    Patient Stated Goals to improve balance, reduce falls           Treatment Today:  Neuro re-ed: -in parallel bars: PT verbal cues to step forward/reverse/right/left to work in anticipatory stepping motor planning with pt; then progressed toPT manual perturbation in anterior or  posterior direction (pt inside parallel bars and PT CGA) with pt stepping forward/revese to maintain balance (5 min)  -in parallel bars:onairex pt shift COM x10 ea laterally, forward, posterior to edge of limit of stability and then back to center- requires UE support in posterior direction, then reaching for cone in multidirections R UE and L UE  -stepping activities for balance/coordination on blue floor ladder:  Forward/reverse "rock step" leading with R foot front/back and L foot front/back x20; "rock step" laterally away from base of support alternating R/L x20; stepping forward/reverse/laterally with PT cue which direction (so pt has to quickly process and respond and maintain balance) x 3 min (PT CGA with 1 hand hold support and pt in gait belt)   Therapeutic Exercise: -hurdles: forward  stepping, side stepping (4 laps ea), cues for: focus on 1 hand support, slow and controlled pacing during exercise, also focused on hip flexion strategy for clearing hurdles and when pt reports L hip pain became limiting factor then transitioned to knee flexion strategy for clearing L foot. Discussed purpose of this exercise to practice stepping in/out of shower which has a very small lip to clear.   -sit to stand: with use of UE on thighs 3x5, cues for: pacing/control/body mechanics (scoot hips forward before standing) and not using momentum  -reverse walking in parallel bars: 2 hand hold support x 4 laps, brief rest then 1 hand support x 4 laps (PT SBA)- no major losses of balance            PT Education - 03/29/21 1132    Education Details exercise technique/form, body mechanics and safety for stepping in/out of shower and getting dressed sitting in chair in her bathroom afterwards.    Person(s) Educated Patient    Methods Explanation;Verbal cues    Comprehension Verbalized understanding            PT Short Term Goals - 03/14/21 1258      PT SHORT TERM GOAL #1   Title Pt will demonstrate ability to safely step laterally over small hurdle using 1 rail (to simulate stepping in/out of shower) to promote independence with showering    Baseline 03/14/21- pt reports she takes sponge baths because she is fearful of falling    Time 3    Period Weeks    Status New    Target Date 04/04/21             PT Long Term Goals - 03/16/21 1214      PT LONG TERM GOAL #1   Title pt will be able to ambulate independently transfer sit to stand (and stand to sit) in and out of passenger side of car and in/out of living room chair without loss of balance    Baseline 03/14/21- pt reports having difficulty with transfers from sit to stand- this is when she loses her balance at home    Time 6    Period Weeks    Status New    Target Date 04/25/21      PT LONG TERM GOAL #2   Title Pt will  be able to perform 10 minutes of light housework (sweep kitchen floor) without loss of balance    Baseline 03/14/21- pt reports avoiding cleaning/housework because of her balance    Time 6    Period Weeks    Status New    Target Date 04/25/21      PT LONG TERM GOAL #3   Title Improve FOTO to 48  indicating improved functional ability at home without being limited by balance    Baseline 03/14/21- 43    Time 6    Period Weeks    Status New    Target Date 04/25/21      PT LONG TERM GOAL #4   Title Improve ABC-S scale by at least 13% indicating a clinical significant change in balance confidence    Baseline 03/14/21: 15/45 (33%)    Time 6    Period Weeks    Status New    Target Date 04/25/21                 Plan - 03/29/21 1138    Clinical Impression Statement Pt had difficulty with controling ascent and descent during sit to stand; she required verbal cues for scooting hips forward before the transfer to improve balance and safety during transfer.  Worked on reverse ambulation and anticipatory backwards stepping strategies today with pt in parallel bars to address balance concerns if she has to step backwards to avoid an obstacle.  She tolerated this well, had no major losses of balance during training today.    Personal Factors and Comorbidities Age;Comorbidity 3+    Comorbidities h/o multiple THA, lumbar spine surgery, TIA (affected hand writing, speech, balance)    Examination-Activity Limitations Bathing;Bend;Lift;Locomotion Level;Transfers;Stairs    Examination-Participation Restrictions Community Activity;Cleaning;Laundry    Stability/Clinical Decision Making Evolving/Moderate complexity    Rehab Potential Good    PT Frequency 2x / week    PT Duration 6 weeks    PT Next Visit Plan cont reverse walking, continue with dynamc balance exercises and hip strengthening    PT Home Exercise Plan Medbridge : JMACTFLY    Consulted and Agree with Plan of Care Patient;Family  member/caregiver    Family Member Consulted daughter Shirlean Mylar)           Patient will benefit from skilled therapeutic intervention in order to improve the following deficits and impairments:  Abnormal gait,Difficulty walking,Decreased range of motion,Decreased endurance,Pain,Decreased activity tolerance,Improper body mechanics,Decreased balance,Decreased strength,Decreased mobility  Visit Diagnosis: Imbalance  Muscle weakness (generalized)  Unsteadiness on feet     Problem List Patient Active Problem List   Diagnosis Date Noted  . Difficulty with speech 02/12/2021  . Cough 12/10/2020  . Trigger finger, right ring finger 03/05/2020  . Anemia of chronic kidney failure, stage 3 (moderate) ([place]) 07/20/2019  . Chest pain 04/17/2019  . Bradycardia 02/02/2019  . Afib ([place]) 02/02/2019  . CKD (chronic kidney disease) stage 3, GFR 30-59 ml/min (HCC) 10/18/2018  . Rash 06/14/2018  . SI (sacroiliac) joint dysfunction 01/07/2018  . Dizziness 05/22/2017  . History of total left hip replacement 05/17/2017  . Trochanteric bursitis of left hip 05/17/2017  . Iron deficiency anemia due to chronic blood loss 09/20/2016  . Hypotension 04/06/2016  . Respiratory infection 04/06/2016  . Other specified respiratory disorders 04/06/2016  . Health care maintenance 10/28/2015  . 2-vessel coronary artery disease 09/22/2015  . SOB (shortness of breath) on exertion 09/20/2015  . Knee pain, bilateral 09/20/2015  . Sleeping difficulty 07/24/2015  . Hepatomegaly 07/24/2015  . Sleep disorder 07/24/2015  . Hepatomegaly, not elsewhere classified 07/24/2015  . Generalized anxiety disorder 04/23/2015  . UTI (urinary tract infection) 05/22/2014  . Hip pain 10/11/2013  . Pain in hip 10/11/2013  . Tendonitis 07/19/2013  . Leg numbness 04/07/2013  . CAD (coronary artery disease) 09/05/2012  . Hypertension 09/05/2012  . Hypercholesteremia 09/05/2012  . Diabetes mellitus ([place]) 09/05/2012  .  Carotid  arterial disease ([place]) 09/05/2012  . Normocytic anemia 09/05/2012  . Disorder of artery or arteriole ([place]) 09/05/2012  . Essential (primary) hypertension 09/05/2012  . Anemia 09/05/2012    Pincus Badder 03/29/2021, 11:57 AM Merdis Delay, PT, DPT   [place] [facility] [address] [place], [place], [address] Phone: [phone]   Fax:  [phone]  Name: Jocelyn Elliott MRN: [id number] Date of Birth: Sep 27, 1932

## 2021-04-03 ENCOUNTER — Ambulatory Visit: Payer: Medicare Other

## 2021-04-03 ENCOUNTER — Other Ambulatory Visit: Payer: Self-pay

## 2021-04-03 DIAGNOSIS — R2681 Unsteadiness on feet: Secondary | ICD-10-CM

## 2021-04-03 DIAGNOSIS — R2689 Other abnormalities of gait and mobility: Secondary | ICD-10-CM | POA: Diagnosis not present

## 2021-04-03 DIAGNOSIS — M6281 Muscle weakness (generalized): Secondary | ICD-10-CM

## 2021-04-03 NOTE — Therapy (Signed)
[place] [facility] [address] [place], [place], [address] Phone: [phone]   Fax:  [phone]  Physical Therapy Treatment  Patient Details  Name: Jocelyn Elliott MRN: [id number] Date of Birth: 01-23-1932 Referring Provider (PT): Dr. Manuella Ghazi   Encounter Date: 04/03/2021   PT End of Session - 04/03/21 1115    Visit Number 6    Number of Visits 12    Date for PT Re-Evaluation 04/25/21    Authorization Type Medicare    Authorization Time Period 10 visits, eval 03/14/21    Authorization - Visit Number 6    Authorization - Number of Visits 10    Progress Note Due on Visit 10    PT Start Time 6812    PT Stop Time 1100    PT Time Calculation (min) 45 min    Equipment Utilized During Treatment Gait belt    Activity Tolerance Patient tolerated treatment well    Behavior During Therapy [facility] for tasks assessed/performed           Past Medical History:  Diagnosis Date  . Arthritis   . AV malformation of gastrointestinal tract   . Blood in stool   . CAD (coronary artery disease)   . Carotid arterial disease ([place])   . Cataracts, bilateral   . Chronic blood loss anemia   . Chronic cystitis   . Complication of anesthesia    reaction to propofol  . Depression   . Diabetes mellitus ([place])   . GERD (gastroesophageal reflux disease)   . Hyperlipidemia   . Hypertension   . IDA (iron deficiency anemia)   . Stress incontinence     Past Surgical History:  Procedure Laterality Date  . ABDOMINAL HYSTERECTOMY  1972   ovaries left in place  . APPENDECTOMY  1992  . BACK SURGERY  06/08/2010   L3, L4, L5  . CAROTID ARTERY ANGIOPLASTY  11/03  . [place], 92   right then left   . CHOLECYSTECTOMY  1993  . COLONOSCOPY WITH PROPOFOL N/A 02/02/2019   Procedure: COLONOSCOPY WITH PROPOFOL;  Surgeon: Manya Silvas, MD;  Location: [facility] ENDOSCOPY;  Service: Endoscopy;  Laterality: N/A;  . ESOPHAGOGASTRODUODENOSCOPY N/A 02/02/2019    Procedure: ESOPHAGOGASTRODUODENOSCOPY (EGD);  Surgeon: Manya Silvas, MD;  Location: [facility] ENDOSCOPY;  Service: Endoscopy;  Laterality: N/A;  . FOOT SURGERY  06/2007   Left  . right carotid artery surgery  01/09/13   Dr. Delana Meyer @ [facility]  . TOTAL HIP ARTHROPLASTY  05/03/2011   left 12, right 11/07    There were no vitals filed for this visit.   Subjective Assessment - 04/03/21 1009    Subjective Pt reports she is still feeling nervous about taking a shower; she is fearful about falling.  She practiced getting in/out fully clothed over the weekend.  She reports the R knee is bothering her a little this morning, maybe related to doing some squat/bending to deadhead some roses near her porch over the weekend.  She reports no falls since last session.  She was able to stand to cook a casserole over the weekend without losing her balance.    Pertinent History L THA in 2012, R THA 2007.    Patient Stated Goals to improve balance, reduce falls           Treatment Today:    Neuro re-ed: -in parallel bars:  -lateral stepping on airex beam 4 laps without hand support -standing on  airex turning to R to reach for cone behind her, transfer cone to other hand and hand off to PT on opposite side (varied heights for reach high/low/away from body) 4 min -amb around cones (weaving in/out) CW and CCW 6 laps   Therapeutic Exercise: -hurdles: forward stepping in open space using SPC instead of parallel bars, 4 laps with PT CGA, pt states she is "fearful" of falling with this scenario outside of parallel bars, she reaches for PT hands during 2 laps, then able to complete 2 laps just using her SPC  -sit to stand: with use of UE on thighs3x5, cues for: pacing/control/body mechanics (scoot hips forward before standing) and not using momentum  -forward/reverse walking 10 m intervals in grass with SPC 4 laps (PT SBA)- no major losses of balance today -amb up/down incline 2x with SPC- no major  losses of balance -amb with PT x 5 min (focusing on walking endurance, turning R/L, safety awareness scanning for obstacles in busy hallway intersections), emphasized placement of SPC laterally and not in front of her R foot -amb over surface changes flat/large gym mat, carpet with SPC x 4 min     PT Education - 04/03/21 1114    Education Details exercise technique/form, safety during amb while using her SPC- placing it to the side and not in front of her R foot    Person(s) Educated Patient    Methods Explanation;Demonstration    Comprehension Verbalized understanding;Returned demonstration            PT Short Term Goals - 04/03/21 1116      PT SHORT TERM GOAL #1   Title Pt will demonstrate ability to safely step laterally over small hurdle using 1 rail (to simulate stepping in/out of shower) to promote independence with showering    Baseline 03/14/21- pt reports she takes sponge baths because she is fearful of falling; 04/03/21- pt able to perform step over hurdle, practicing transfer in/out of shower fully clothed at home, but is still fearful of falling and taking sponge baths    Time 3    Period Weeks    Status Partially Met    Target Date 04/04/21             PT Long Term Goals - 04/03/21 1117      PT LONG TERM GOAL #1   Title pt will be able to ambulate independently transfer sit to stand (and stand to sit) in and out of passenger side of car and in/out of living room chair without loss of balance    Baseline 03/14/21- pt reports having difficulty with transfers from sit to stand- this is when she loses her balance at home; 04/03/21- pt able to perform transfer with PT but states she does not always use the safe technique on her own yet    Time 6    Period Weeks    Status Partially Met      PT LONG TERM GOAL #2   Title Pt will be able to perform 10 minutes of light housework (sweep kitchen floor) without loss of balance    Baseline 03/14/21- pt reports avoiding  cleaning/housework because of her balance; 04/03/21- pt able to stand to cook casserole this weekend, hasn't done cleaning yet    Time 6    Period Weeks    Status Partially Met      PT LONG TERM GOAL #3   Title Improve FOTO to 48 indicating improved functional ability at home without being  limited by balance    Baseline 03/14/21- 43    Time 6    Period Weeks    Status New      PT LONG TERM GOAL #4   Title Improve ABC-S scale by at least 13% indicating a clinical significant change in balance confidence    Baseline 03/14/21: 15/45 (33%)    Time 6    Period Weeks    Status New                 Plan - 04/03/21 1128    Clinical Impression Statement Pt had no loss of balance while amb on uneven surfaces or surface changes today.  She does ambulate backwards slowly on uneven surface and requires cues for extending hip to step posteriorly.  Her report of R knee pain did not limit her ability to participate in PT session today.  She continues to express fear of falling in the shower despite working on skilled interventions that address the mechanics needed for this transfer and activity.  Overall though, she is making progress with tx based on assessment of PT goals today.    Personal Factors and Comorbidities Age;Comorbidity 3+    Comorbidities h/o multiple THA, lumbar spine surgery, TIA (affected hand writing, speech, balance)    Examination-Activity Limitations Bathing;Bend;Lift;Locomotion Level;Transfers;Stairs    Examination-Participation Restrictions Community Activity;Cleaning;Laundry    Stability/Clinical Decision Making Evolving/Moderate complexity    Rehab Potential Good    PT Frequency 2x / week    PT Duration 6 weeks    PT Next Visit Plan cont reverse walking, continue with dynamc balance exercises and hip strengthening    PT Home Exercise Plan Medbridge : JMACTFLY    Consulted and Agree with Plan of Care Patient;Family member/caregiver    Family Member Consulted daughter  Shirlean Mylar)           Patient will benefit from skilled therapeutic intervention in order to improve the following deficits and impairments:  Abnormal gait,Difficulty walking,Decreased range of motion,Decreased endurance,Pain,Decreased activity tolerance,Improper body mechanics,Decreased balance,Decreased strength,Decreased mobility  Visit Diagnosis: Imbalance  Muscle weakness (generalized)  Unsteadiness on feet     Problem List Patient Active Problem List   Diagnosis Date Noted  . Difficulty with speech 02/12/2021  . Cough 12/10/2020  . Trigger finger, right ring finger 03/05/2020  . Anemia of chronic kidney failure, stage 3 (moderate) ([place]) 07/20/2019  . Chest pain 04/17/2019  . Bradycardia 02/02/2019  . Afib ([place]) 02/02/2019  . CKD (chronic kidney disease) stage 3, GFR 30-59 ml/min (HCC) 10/18/2018  . Rash 06/14/2018  . SI (sacroiliac) joint dysfunction 01/07/2018  . Dizziness 05/22/2017  . History of total left hip replacement 05/17/2017  . Trochanteric bursitis of left hip 05/17/2017  . Iron deficiency anemia due to chronic blood loss 09/20/2016  . Hypotension 04/06/2016  . Respiratory infection 04/06/2016  . Other specified respiratory disorders 04/06/2016  . Health care maintenance 10/28/2015  . 2-vessel coronary artery disease 09/22/2015  . SOB (shortness of breath) on exertion 09/20/2015  . Knee pain, bilateral 09/20/2015  . Sleeping difficulty 07/24/2015  . Hepatomegaly 07/24/2015  . Sleep disorder 07/24/2015  . Hepatomegaly, not elsewhere classified 07/24/2015  . Generalized anxiety disorder 04/23/2015  . UTI (urinary tract infection) 05/22/2014  . Hip pain 10/11/2013  . Pain in hip 10/11/2013  . Tendonitis 07/19/2013  . Leg numbness 04/07/2013  . CAD (coronary artery disease) 09/05/2012  . Hypertension 09/05/2012  . Hypercholesteremia 09/05/2012  . Diabetes mellitus ([place]) 09/05/2012  . Carotid  arterial disease ([place]) 09/05/2012  . Normocytic anemia  09/05/2012  . Disorder of artery or arteriole ([place]) 09/05/2012  . Essential (primary) hypertension 09/05/2012  . Anemia 09/05/2012    Pincus Badder 04/03/2021, 11:32 AM Merdis Delay, PT, DPT   [place] [facility] [address] [place], [place], [address] Phone: [phone]   Fax:  [phone]  Name: Jocelyn Elliott MRN: [id number] Date of Birth: [birth year]

## 2021-04-04 ENCOUNTER — Telehealth (INDEPENDENT_AMBULATORY_CARE_PROVIDER_SITE_OTHER): Payer: Medicare Other | Admitting: Internal Medicine

## 2021-04-04 DIAGNOSIS — I25119 Atherosclerotic heart disease of native coronary artery with unspecified angina pectoris: Secondary | ICD-10-CM

## 2021-04-04 DIAGNOSIS — I10 Essential (primary) hypertension: Secondary | ICD-10-CM | POA: Diagnosis not present

## 2021-04-04 DIAGNOSIS — I700 Atherosclerosis of aorta: Secondary | ICD-10-CM

## 2021-04-04 DIAGNOSIS — N183 Chronic kidney disease, stage 3 unspecified: Secondary | ICD-10-CM

## 2021-04-04 DIAGNOSIS — G479 Sleep disorder, unspecified: Secondary | ICD-10-CM

## 2021-04-04 DIAGNOSIS — W19XXXA Unspecified fall, initial encounter: Secondary | ICD-10-CM

## 2021-04-04 DIAGNOSIS — E7800 Pure hypercholesterolemia, unspecified: Secondary | ICD-10-CM

## 2021-04-04 DIAGNOSIS — I779 Disorder of arteries and arterioles, unspecified: Secondary | ICD-10-CM

## 2021-04-04 DIAGNOSIS — E1159 Type 2 diabetes mellitus with other circulatory complications: Secondary | ICD-10-CM

## 2021-04-04 DIAGNOSIS — D696 Thrombocytopenia, unspecified: Secondary | ICD-10-CM

## 2021-04-04 DIAGNOSIS — I4891 Unspecified atrial fibrillation: Secondary | ICD-10-CM

## 2021-04-04 NOTE — Progress Notes (Signed)
Patient ID: Jocelyn Elliott, female   DOB: 16-Sep-1932, 87 y.o.   MRN: [id number]   Virtual Visit via telephone Note  This visit type was conducted due to national recommendations for restrictions regarding the COVID-19 pandemic (e.g. social distancing).  This format is felt to be most appropriate for this patient at this time.  All issues noted in this document were discussed and addressed.  No physical exam was performed (except for noted visual exam findings with Video Visits).   I connected with [name] by telephone and verified that I am speaking with the correct person using two identifiers. Location patient: home Location provider: work  Persons participating in the telephone visit: patient, provider  The limitations, risks, security and privacy concerns of performing an evaluation and management service by telephone and the availability of in person appointments have been discussed.  It has also been discussed with the patient that there may be a patient responsible charge related to this service. The patient expressed understanding and agreed to proceed.   Reason for visit: follow up appt  HPI: Recently evaluated and diagnosed with presumed TIA.  Was placed on plavix x 21 days and continued on aspirin.  She has been going to PT to work on her balance, gait and strengthening.  Does use her cane. Has had no other episodes.  No headache or dizziness.  No chest pain.  Breathing stable.  Did have a fall 02/28/21.  States her left knee gave out.  Bruised her leg. No other injury. No head injury.  No other falls.  Does feel PT is helping.  Receiving iron infusions.  Last hgb 11.7.  Eating.  No nausea or vomiting.  Bowels moving.  Blood sugars are doing well - 109-126.     ROS: See pertinent positives and negatives per HPI.  Past Medical History:  Diagnosis Date  . Arthritis   . AV malformation of gastrointestinal tract   . Blood in stool   . CAD (coronary artery disease)   .  Carotid arterial disease ([place])   . Cataracts, bilateral   . Chronic blood loss anemia   . Chronic cystitis   . Complication of anesthesia    reaction to propofol  . Depression   . Diabetes mellitus ([place])   . GERD (gastroesophageal reflux disease)   . Hyperlipidemia   . Hypertension   . IDA (iron deficiency anemia)   . Stress incontinence     Past Surgical History:  Procedure Laterality Date  . ABDOMINAL HYSTERECTOMY  1972   ovaries left in place  . APPENDECTOMY  1992  . BACK SURGERY  06/08/2010   L3, L4, L5  . CAROTID ARTERY ANGIOPLASTY  11/03  . [place], 92   right then left   . CHOLECYSTECTOMY  1993  . COLONOSCOPY WITH PROPOFOL N/A 02/02/2019   Procedure: COLONOSCOPY WITH PROPOFOL;  Surgeon: Manya Silvas, MD;  [facility] END[facility]COPY;  Service: Endoscopy;  Laterality: N/A;  . ESOPHAGOGASTRODUODENOSCOPY N/A 02/02/2019   Procedure: ESOPHAGOGASTRODUODENOSCOPY (EGD);  Surgeon: Manya Silvas, MD;  Location: [facility] ENDOSCOPY;  Service: Endoscopy;  Laterality: N/A;  . FOOT SURGERY  06/2007   Left  . right carotid artery surgery  01/09/13   Dr. Delana Meyer @ [facility]  . TOTAL HIP ARTHROPLASTY  05/03/2011   left 12, right 11/07    Family History  Problem Relation Age of Onset  . Hyperlipidemia Father   . Heart disease Father  myocardial infarction  . Hypertension Father        Parent  . Arthritis Other        parent  . Diabetes Other        nephew  . Cervical cancer Sister   . Rectal cancer Sister   . Breast cancer Neg Hx     SOCIAL HX: reviewed.    Current Outpatient Medications:  .  aspirin 81 MG tablet, Take 81 mg by mouth daily., Disp: , Rfl:  .  Biotin 5000 MCG CAPS, Take 1 capsule by mouth daily., Disp: , Rfl:  .  blood glucose meter kit and supplies KIT, Dispense based on insurance preference. Use daily to check sugars once daily. Dx e11.9, Disp: 1 each, Rfl: 0 .  cetirizine (ZYRTEC) 10 MG tablet, Take 10 mg by mouth daily., Disp: ,  Rfl:  .  ferrous sulfate 325 (65 FE) MG EC tablet, Take 325 mg by mouth daily with breakfast. Every other day, Disp: , Rfl:  .  isosorbide mononitrate (IMDUR) 30 MG 24 hr tablet, Take 30 mg by mouth daily. , Disp: , Rfl:  .  Lancets (ONETOUCH DELICA PLUS HALPFX90W) MISC, USE AS INSTRUCTED TO CHECK BLOOD SUGARS ONCE DAILY. DX E 11.9, Disp: 100 each, Rfl: 12 .  metFORMIN (GLUCOPHAGE) 500 MG tablet, TAKE 1 TABLET BY MOUTH EVERY DAY WITH BREAKFAST, Disp: 90 tablet, Rfl: 1 .  Omega-3 Fatty Acids (FISH OIL) 1200 MG CAPS, Take 1 capsule by mouth daily., Disp: , Rfl:  .  ONETOUCH ULTRA test strip, USE AS INSTRUCTED TO CHECK BLOOD SUGARS ONCE DAILY. DX E11.9, Disp: 100 strip, Rfl: 12 .  rosuvastatin (CRESTOR) 40 MG tablet, TAKE 1 TABLET BY MOUTH EVERY DAY, Disp: 30 tablet, Rfl: 0 .  traZODone (DESYREL) 50 MG tablet, Take 0.5-1 tablets (25-50 mg total) by mouth at bedtime as needed for sleep., Disp: 90 tablet, Rfl: 1  EXAM:  GENERAL: alert. Sounds to be in no acute distress.  Answering questions appropriately.   PSYCH/NEURO: pleasant and cooperative, no obvious depression or anxiety, speech and thought processing grossly intact  ASSESSMENT AND PLAN:  Discussed the following assessment and plan:  Problem List Items Addressed This Visit    Afib ([place])    No increased heart rate or palpitations.  On aspirin daily.  Followed by cardiology.       Aortic atherosclerosis (HCC)    Continue crestor.       Carotid arterial disease ([place])    Evaluated by Dr Lucky Cowboy - bilateral - <50% 08/2020. Continue crestor and aspirin.  Continue f/u with [facility].       CKD (chronic kidney disease) stage 3, GFR 30-59 ml/min (HCC)    Continue to avoid antiinflammatories.  Stay hydrated.  Follow metabolic panel.       Coronary artery disease with angina pectoris ([place])    Continue imdur and crestor.  Continue risk factor modification.  No chest pain.  Follow.       Diabetes mellitus ([place])    Sugars as outlined.  Continue  low carb diet and exercise.  Follow met b and a1c.       Fall    Recent fall.  Knee just gave out.  Bruised leg.  No other injury.  Going to therapy.  Helping with strengthening and gait.  Follow.       Hypercholesteremia    Continue crestor.  Low cholesterol diet and exercise.  Follow lipid panel and liver function tests.  Hypertension    Continues on imdur.  Blood pressure has been under control.  Continue current medication regimen.  Follow pressures.  Follow metabolic panel.       Sleeping difficulty    Continues on trazodone.        Thrombocytopenia (HCC)    Platelet count recheck 03/27/21 - wnl.  Follow.          Return in about 3 months (around 07/05/2021) for physical.   I discussed the assessment and treatment plan with the patient. The patient was provided an opportunity to ask questions and all were answered. The patient agreed with the plan and demonstrated an understanding of the instructions.   The patient was advised to call back or seek an in-person evaluation if the symptoms worsen or if the condition fails to improve as anticipated.  I provided 23 minutes of non-face-to-face time during this encounter.   Einar Pheasant, MD

## 2021-04-05 ENCOUNTER — Other Ambulatory Visit: Payer: Self-pay

## 2021-04-05 ENCOUNTER — Other Ambulatory Visit: Payer: Self-pay | Admitting: Internal Medicine

## 2021-04-05 ENCOUNTER — Ambulatory Visit: Payer: Medicare Other

## 2021-04-05 DIAGNOSIS — R2681 Unsteadiness on feet: Secondary | ICD-10-CM

## 2021-04-05 DIAGNOSIS — R2689 Other abnormalities of gait and mobility: Secondary | ICD-10-CM

## 2021-04-05 DIAGNOSIS — M6281 Muscle weakness (generalized): Secondary | ICD-10-CM

## 2021-04-05 NOTE — Therapy (Signed)
[facility] [address] [place], [place], [address] Phone: [phone]   Fax:  [phone]  Physical Therapy Treatment  Patient Details  Name: Jocelyn Elliott MRN: [id number] Date of Birth: 11-07-1932 Referring Provider (PT): Dr. Manuella Ghazi   Encounter Date: 04/05/2021   PT End of Session - 04/05/21 1133    Visit Number 7    Number of Visits 12    Date for PT Re-Evaluation 04/25/21    Authorization Type Medicare    Authorization Time Period 10 visits, eval 03/14/21    Authorization - Visit Number 7    Authorization - Number of Visits 10    Progress Note Due on Visit 10    PT Start Time 1950    PT Stop Time 1100    PT Time Calculation (min) 45 min    Equipment Utilized During Treatment Gait belt    Activity Tolerance Patient tolerated treatment well    Behavior During Therapy [facility] for tasks assessed/performed           Past Medical History:  Diagnosis Date  . Arthritis   . AV malformation of gastrointestinal tract   . Blood in stool   . CAD (coronary artery disease)   . Carotid arterial disease ([place])   . Cataracts, bilateral   . Chronic blood loss anemia   . Chronic cystitis   . Complication of anesthesia    reaction to propofol  . Depression   . Diabetes mellitus ([place])   . GERD (gastroesophageal reflux disease)   . Hyperlipidemia   . Hypertension   . IDA (iron deficiency anemia)   . Stress incontinence     Past Surgical History:  Procedure Laterality Date  . ABDOMINAL HYSTERECTOMY  1972   ovaries left in place  . APPENDECTOMY  1992  . BACK SURGERY  06/08/2010   L3, L4, L5  . CAROTID ARTERY ANGIOPLASTY  11/03  . [place], [place]   right then left   . CHOLECYSTECTOMY  1993  . COLONOSCOPY WITH PROPOFOL N/A 02/02/2019   Procedure: COLONOSCOPY WITH PROPOFOL;  Surgeon: Manya Silvas, MD;  Location[facility] ENDOSCOPY;  Service: Endoscopy;  Laterality: N/A;  . ESOPHAGOGASTRODUODENOSCOPY N/A 02/02/2019    Procedure: ESOPHAGOGASTRODUODENOSCOPY (EGD);  Surgeon: Manya Silvas, MD;  Location: [facility] ENDOSCOPY;  Service: Endoscopy;  Laterality: N/A;  . FOOT SURGERY  06/2007   Left  . right carotid artery surgery  01/09/13   Dr. Delana Meyer @ [facility]  . TOTAL HIP ARTHROPLASTY  05/03/2011   left 12, right 11/07    There were no vitals filed for this visit.   Subjective Assessment - 04/05/21 1131    Subjective Pt states she is trying to do a little more cooking this week.  She is using the cane a little less in her home, still using it outside her home.  She reports no falls since last session.  She has not taken a shower yet due to fear of falling in her shower.  She feels unstead when moving backwards.    Pertinent History L THA in 2012, R THA 2007.    Patient Stated Goals to improve balance, reduce falls           Treatment Today:     Neuro re-ed: -in parallel bars:  -lateral stepping on airex beam 4 laps without hand support, tandem walk 4 laps with single hand hold support -standing on airex turning to R to reach for cone  behind her, transfer cone to other hand and hand off to PT on opposite side (varied heights for reach high/low/away from body) 4 min -weight shift with slow hamstring curl to SLS while amb forward 3 laps with SPC (PT SBA) -PT cues for direction of stepping to work on motor planning/movement response to external cues- forward, lateral, reverse  Therapeutic Exercise: -hurdles: forward stepping in open space using SPC instead of parallel bars, 4 laps with PT CGA, pt had 1 small loss of balance which required CGA PT assistance to regain balance while stepping over with L LE   -sit to stand: with use of UE on thighs 3x5, cues for: pacing/control/body mechanics (scoot hips forward before standing) and not using momentum   -forward/reverse walking/lateral stepping 10 m intervals in grass with SPC 3 laps ea (PT SBA)- no major losses of balance today -amb up/down incline 2x  with SPC- no major losses of balance -amb with step placement on target square on floor: 2 min  -amb over surface changes flat/large gym mat, carpet with SPC x 4 min -front step up onto airex leading with L LE/R LE x15 ea (single hand support on rail)- to simulate stepping into shower            PT Education - 04/05/21 1133    Education Details step length while amb on uneven surfaces    Person(s) Educated Patient    Methods Explanation;Demonstration    Comprehension Verbalized understanding;Need further instruction            PT Short Term Goals - 04/03/21 1116      PT SHORT TERM GOAL #1   Title Pt will demonstrate ability to safely step laterally over small hurdle using 1 rail (to simulate stepping in/out of shower) to promote independence with showering    Baseline 03/14/21- pt reports she takes sponge baths because she is fearful of falling; 04/03/21- pt able to perform step over hurdle, practicing transfer in/out of shower fully clothed at home, but is still fearful of falling and taking sponge baths    Time 3    Period Weeks    Status Partially Met    Target Date 04/04/21             PT Long Term Goals - 04/03/21 1117      PT LONG TERM GOAL #1   Title pt will be able to ambulate independently transfer sit to stand (and stand to sit) in and out of passenger side of car and in/out of living room chair without loss of balance    Baseline 03/14/21- pt reports having difficulty with transfers from sit to stand- this is when she loses her balance at home; 04/03/21- pt able to perform transfer with PT but states she does not always use the safe technique on her own yet    Time 6    Period Weeks    Status Partially Met      PT LONG TERM GOAL #2   Title Pt will be able to perform 10 minutes of light housework (sweep kitchen floor) without loss of balance    Baseline 03/14/21- pt reports avoiding cleaning/housework because of her balance; 04/03/21- pt able to stand to cook  casserole this weekend, hasn't done cleaning yet    Time 6    Period Weeks    Status Partially Met      PT LONG TERM GOAL #3   Title Improve FOTO to 48 indicating improved functional ability at  home without being limited by balance    Baseline 03/14/21- 43    Time 6    Period Weeks    Status New      PT LONG TERM GOAL #4   Title Improve ABC-S scale by at least 13% indicating a clinical significant change in balance confidence    Baseline 03/14/21: 15/45 (33%)    Time 6    Period Weeks    Status New                 Plan - 04/05/21 1134    Clinical Impression Statement Pt was challenged with new exercises focusing on longer weight shift single leg stance time today; it was difficult to achieve full steady balance on single leg.  She had 1 small LOB while amb over hurdles leading with L LE when L LE did not clear hurdle.  L hip pain and weakness continue to affect her balance and ability to perform step up onto or over an obstacle leading with L LE.  She should cont to benefit from skilled PT to address balance, gait, and LE strength and mobility to improve safety and reduce fall risk.    Personal Factors and Comorbidities Age;Comorbidity 3+    Comorbidities h/o multiple THA, lumbar spine surgery, TIA (affected hand writing, speech, balance)    Examination-Activity Limitations Bathing;Bend;Lift;Locomotion Level;Transfers;Stairs    Examination-Participation Restrictions Community Activity;Cleaning;Laundry    Stability/Clinical Decision Making Evolving/Moderate complexity    Rehab Potential Good    PT Frequency 2x / week    PT Duration 6 weeks    PT Next Visit Plan cont reverse walking, continue with dynamc balance exercises and hip strengthening    PT Home Exercise Plan [employer] : [email or web address]    Consulted and Agree with Plan of Care Patient;Family member/caregiver    Family Member Consulted daughter Shirlean Mylar)           Patient will benefit from skilled therapeutic intervention in  order to improve the following deficits and impairments:  Abnormal gait,Difficulty walking,Decreased range of motion,Decreased endurance,Pain,Decreased activity tolerance,Improper body mechanics,Decreased balance,Decreased strength,Decreased mobility  Visit Diagnosis: Imbalance  Muscle weakness (generalized)  Unsteadiness on feet     Problem List Patient Active Problem List   Diagnosis Date Noted  . Difficulty with speech 02/12/2021  . Cough 12/10/2020  . Trigger finger, right ring finger 03/05/2020  . Anemia of chronic kidney failure, stage 3 (moderate) ([place]) 07/20/2019  . Chest pain 04/17/2019  . Bradycardia 02/02/2019  . Afib ([place]) 02/02/2019  . CKD (chronic kidney disease) stage 3, GFR 30-59 ml/min (HCC) 10/18/2018  . Rash 06/14/2018  . SI (sacroiliac) joint dysfunction 01/07/2018  . Dizziness 05/22/2017  . History of total left hip replacement 05/17/2017  . Trochanteric bursitis of left hip 05/17/2017  . Iron deficiency anemia due to chronic blood loss 09/20/2016  . Hypotension 04/06/2016  . Respiratory infection 04/06/2016  . Other specified respiratory disorders 04/06/2016  . Health care maintenance 10/28/2015  . 2-vessel coronary artery disease 09/22/2015  . SOB (shortness of breath) on exertion 09/20/2015  . Knee pain, bilateral 09/20/2015  . Sleeping difficulty 07/24/2015  . Hepatomegaly 07/24/2015  . Sleep disorder 07/24/2015  . Hepatomegaly, not elsewhere classified 07/24/2015  . Generalized anxiety disorder 04/23/2015  . UTI (urinary tract infection) 05/22/2014  . Hip pain 10/11/2013  . Pain in hip 10/11/2013  . Tendonitis 07/19/2013  . Leg numbness 04/07/2013  . CAD (coronary artery disease) 09/05/2012  . Hypertension 09/05/2012  . Hypercholesteremia 09/05/2012  .  Diabetes mellitus ([place]) 09/05/2012  . Carotid arterial disease ([place]) 09/05/2012  . Normocytic anemia 09/05/2012  . Disorder of artery or arteriole ([place]) 09/05/2012  . Essential (primary)  hypertension 09/05/2012  . Anemia 09/05/2012    Pincus Badder 04/05/2021, 11:57 AM Merdis Delay, PT, DPT Physical Therapist - [place]    [facility] [address] [place], [place], [address] Phone: [phone]   Fax:  [phone]  Name: ANAMARIA DUSENBURY MRN: [id number] Date of Birth: October 17, 1932

## 2021-04-10 ENCOUNTER — Telehealth: Payer: Self-pay | Admitting: Internal Medicine

## 2021-04-10 ENCOUNTER — Ambulatory Visit: Payer: Medicare Other

## 2021-04-10 ENCOUNTER — Encounter: Payer: Self-pay | Admitting: Internal Medicine

## 2021-04-10 DIAGNOSIS — D696 Thrombocytopenia, unspecified: Secondary | ICD-10-CM | POA: Insufficient documentation

## 2021-04-10 DIAGNOSIS — I700 Atherosclerosis of aorta: Secondary | ICD-10-CM | POA: Insufficient documentation

## 2021-04-10 DIAGNOSIS — W19XXXA Unspecified fall, initial encounter: Secondary | ICD-10-CM | POA: Insufficient documentation

## 2021-04-10 NOTE — Assessment & Plan Note (Signed)
Continue imdur and crestor.  Continue risk factor modification.  No chest pain.  Follow.

## 2021-04-10 NOTE — Assessment & Plan Note (Signed)
Continue crestor.  Low cholesterol diet and exercise. Follow lipid panel and liver function tests.   

## 2021-04-10 NOTE — Assessment & Plan Note (Signed)
Continues on trazodone.

## 2021-04-10 NOTE — Assessment & Plan Note (Signed)
No increased heart rate or palpitations.  On aspirin daily.  Followed by cardiology.

## 2021-04-10 NOTE — Assessment & Plan Note (Signed)
Continue crestor 

## 2021-04-10 NOTE — Assessment & Plan Note (Signed)
Sugars as outlined.  Continue low carb diet and exercise.  Follow met b and a1c.

## 2021-04-10 NOTE — Telephone Encounter (Signed)
Symptoms started Saturday. Tested positive this morning. Headache, poor appetite, fatigue, nasal congestion, cough. Says she feels like she does when she has bronchitis. She is taking mucinex and tylenol. She has delsym at home. Confirmed no SOB, chest tightness, fever, chills, body aches, etc. She is vaccinated. She is ok to do whatever she needs to do but confirmed she is doing ok right now. Do you want me to try to work her in or see if she can see another provider?

## 2021-04-10 NOTE — Telephone Encounter (Signed)
I do feel she needs to be seen - to determine what treatment needed and if needs oral anti viral treatment, etc.

## 2021-04-10 NOTE — Assessment & Plan Note (Signed)
Evaluated by Dr Lucky Cowboy - bilateral - <50% 08/2020. Continue crestor and aspirin.  Continue f/u with [facility].

## 2021-04-10 NOTE — Telephone Encounter (Signed)
Pt scheduled with Sharyn Lull

## 2021-04-10 NOTE — Assessment & Plan Note (Signed)
Continue to avoid antiinflammatories.  Stay hydrated.  Follow metabolic panel.  

## 2021-04-10 NOTE — Assessment & Plan Note (Signed)
Platelet count recheck 03/27/21 - wnl.  Follow.

## 2021-04-10 NOTE — Assessment & Plan Note (Signed)
Continues on imdur.  Blood pressure has been under control.  Continue current medication regimen.  Follow pressures.  Follow metabolic panel.

## 2021-04-10 NOTE — Telephone Encounter (Signed)
Patient 's daughter called in stated that mother tested positive for covid this morning wanted to know was it anything or medication she should be taking

## 2021-04-10 NOTE — Assessment & Plan Note (Signed)
Recent fall.  Knee just gave out.  Bruised leg.  No other injury.  Going to therapy.  Helping with strengthening and gait.  Follow.

## 2021-04-11 ENCOUNTER — Telehealth (INDEPENDENT_AMBULATORY_CARE_PROVIDER_SITE_OTHER): Payer: Medicare Other | Admitting: Adult Health

## 2021-04-11 ENCOUNTER — Encounter: Payer: Self-pay | Admitting: Adult Health

## 2021-04-11 ENCOUNTER — Telehealth: Payer: Self-pay

## 2021-04-11 ENCOUNTER — Telehealth: Payer: Self-pay | Admitting: Internal Medicine

## 2021-04-11 VITALS — Ht 59.02 in | Wt 132.0 lb

## 2021-04-11 DIAGNOSIS — H9202 Otalgia, left ear: Secondary | ICD-10-CM

## 2021-04-11 DIAGNOSIS — R519 Headache, unspecified: Secondary | ICD-10-CM | POA: Diagnosis not present

## 2021-04-11 DIAGNOSIS — R0982 Postnasal drip: Secondary | ICD-10-CM | POA: Diagnosis not present

## 2021-04-11 DIAGNOSIS — R059 Cough, unspecified: Secondary | ICD-10-CM

## 2021-04-11 DIAGNOSIS — U071 COVID-19: Secondary | ICD-10-CM | POA: Insufficient documentation

## 2021-04-11 MED ORDER — PREDNISONE 10 MG PO TABS: ORAL_TABLET | ORAL | 0 refills | Status: DC

## 2021-04-11 MED ORDER — CEFDINIR 300 MG PO CAPS: 300.0000 mg | ORAL_CAPSULE | Freq: Two times a day (BID) | ORAL | 0 refills | Status: DC

## 2021-04-11 MED ORDER — HYDROCOD POLST-CPM POLST ER 10-8 MG/5ML PO SUER: 2.5000 mL | Freq: Every evening | ORAL | 0 refills | Status: DC | PRN

## 2021-04-11 MED ORDER — AZITHROMYCIN 250 MG PO TABS: ORAL_TABLET | ORAL | 0 refills | Status: DC

## 2021-04-11 NOTE — Patient Instructions (Addendum)
Sinus Headache  A sinus headache occurs when your sinuses become clogged or swollen. Sinuses are air-filled spaces in your skull that are behind the bones of your face and forehead. Sinus headaches can range from mild to severe. What are the causes? A sinus headache can result from various conditions that affect the sinuses. Common causes include:  Colds.  Sinus infections.  Allergies. Many people confuse sinus headaches with migraines or tension headaches because both headaches can cause facial pain and nasal symptoms. What are the signs or symptoms? The main symptom of this condition is a headache that may feel like pain or pressure in your face, forehead, ears, or upper teeth. People who have a sinus headache often have other symptoms, such as:  Congested or runny nose.  Fever.  Inability to smell. Weather changes can make symptoms worse. How is this diagnosed? This condition may be diagnosed based on:  A physical exam and medical history.  Imaging tests, such as a CT scan or MRI, to check for problems with the sinuses.  Examination of the sinuses using a thin tool with a camera that is inserted through your nose (endoscopy). How is this treated? Treatment for this condition depends on the cause.  Sinus pain that is caused by a sinus infection may be treated with antibiotic medicine.  Sinus pain that is caused by congestion may be helped by rinsing out (flushing) the nose and sinuses with saline solution.  Sinus pain that is caused by allergies may be helped by allergy medicines (antihistamines) and medicated nasal sprays.  Sinus surgery may be needed in some cases if other treatments do not help. Follow these instructions at home: General instructions  If directed: ? Apply a warm, moist washcloth to your face to help relieve pain. ? Use a nasal saline wash. Hydrate and humidify  Drink enough water to keep your urine clear or pale yellow. Staying hydrated will help  to thin your mucus.  Use a cool mist humidifier to keep the humidity level in your home above 50%.  Inhale steam for 10-15 minutes, 3-4 times a day or as told by your health care provider. You can do this in the bathroom while a hot shower is running.  Limit your exposure to cool or dry air. Medicines  Take over-the-counter and prescription medicines only as told by your health care provider.  If you were prescribed an antibiotic medicine, take it as told by your health care provider. Do not stop taking the antibiotic even if you start to feel better.  If you have congestion, use a nasal spray to help lessen pressure.   Contact a health care provider if:  You have a headache more than one time a week.  You have sensitivity to light or sound.  You develop a fever.  You feel nauseous or you vomit.  Your headaches do not get better with treatment. Many people think that they have a sinus headache when they actually have a migraine or a tension headache. Get help right away if:  You have vision problems.  You have sudden, severe pain in your face or head.  You have a seizure.  You are confused.  You have a stiff neck. Summary  A sinus headache occurs when your sinuses become clogged or swollen.  A sinus headache can result from various conditions that affect the sinuses, such as a cold, a sinus infection, or an allergy.  Treatment for this condition depends on the cause. It may include  medicine, such as antibiotics or antihistamines. This information is not intended to replace advice given to you by your health care provider. Make sure you discuss any questions you have with your health care provider. Document Revised: 08/16/2020 Document Reviewed: 08/16/2020 Elsevier Patient Education  2021 [place]. Prednisolone tablets What is this medicine? PREDNISOLONE (pred NISS oh lone) is a corticosteroid. It is commonly used to treat inflammation of the skin, joints, lungs,  and other organs. Common conditions treated include asthma, allergies, and arthritis. It is also used for other conditions, such as blood disorders and diseases of the adrenal glands. This medicine may be used for other purposes; ask your health care provider or pharmacist if you have questions. COMMON BRAND NAME(S): Millipred, Millipred DP, Millipred DP 12-Day, Millipred DP 6 Day, Prednoral What should I tell my health care provider before I take this medicine? They need to know if you have any of these conditions:  Cushing's syndrome  diabetes  glaucoma  heart problems or disease  high blood pressure  infection such as herpes, measles, tuberculosis, or chickenpox  kidney disease  liver disease  mental problems  myasthenia gravis  osteoporosis  seizures  stomach ulcer or intestine disease including colitis and diverticulitis  thyroid problem  an unusual or allergic reaction to lactose, prednisolone, other medicines, foods, dyes, or preservatives  pregnant or trying to get pregnant  breast-feeding How should I use this medicine? Take this medicine by mouth with a glass of water. Follow the directions on the prescription label. Take it with food or milk to avoid stomach upset. If you are taking this medicine once a day, take it in the morning. Do not take more medicine than you are told to take. Do not suddenly stop taking your medicine because you may develop a severe reaction. Your doctor will tell you how much medicine to take. If your doctor wants you to stop the medicine, the dose may be slowly lowered over time to avoid any side effects. Talk to your pediatrician regarding the use of this medicine in children. Special care may be needed. Overdosage: If you think you have taken too much of this medicine contact a poison control center or emergency room at once. NOTE: This medicine is only for you. Do not share this medicine with others. What if I miss a dose? If you  miss a dose, take it as soon as you can. If it is almost time for your next dose, take only that dose. Do not take double or extra doses. What may interact with this medicine? Do not take this medicine with any of the following medications:  metyrapone  mifepristone This medicine may also interact with the following medications:  aminoglutethimide  amphotericin B  aspirin and aspirin-like medicines  barbiturates  certain medicines for diabetes, like glipizide or glyburide  cholestyramine  cholinesterase inhibitors  cyclosporine  digoxin  diuretics  ephedrine  female hormones, like estrogens and birth control pills  isoniazid  ketoconazole  NSAIDS, medicines for pain and inflammation, like ibuprofen or naproxen  phenytoin  rifampin  toxoids  vaccines  warfarin This list may not describe all possible interactions. Give your health care provider a list of all the medicines, herbs, non-prescription drugs, or dietary supplements you use. Also tell them if you smoke, drink alcohol, or use illegal drugs. Some items may interact with your medicine. What should I watch for while using this medicine? Visit your doctor or health care professional for regular checks on your progress.  If you are taking this medicine over a prolonged period, carry an identification card with your name and address, the type and dose of your medicine, and your doctor's name and address. This medicine may increase your risk of getting an infection. Tell your doctor or health care professional if you are around anyone with measles or chickenpox, or if you develop sores or blisters that do not heal properly. If you are going to have surgery, tell your doctor or health care professional that you have taken this medicine within the last twelve months. Ask your doctor or health care professional about your diet. You may need to lower the amount of salt you eat. This medicine may increase blood sugar.  Ask your healthcare provider if changes in diet or medicines are needed if you have diabetes. What side effects may I notice from receiving this medicine? Side effects that you should report to your doctor or health care professional as soon as possible:  allergic reactions like skin rash, itching or hives, swelling of the face, lips, or tongue  changes in emotions or moods  eye pain   signs and symptoms of high blood sugar such as being more thirsty or hungry or having to urinate more than normal. You may also feel very tired or have blurry vision.  signs and symptoms of infection like fever or chills; cough; sore throat; pain or trouble passing urine  slow growth in children (if used for longer periods of time)  swelling of ankles, feet  trouble sleeping  weak bones (if used for longer periods of time) Side effects that usually do not require medical attention (report to your doctor or health care professional if they continue or are bothersome):  nausea  skin problems, acne, thin and shiny skin  upset stomach  weight gain This list may not describe all possible side effects. Call your doctor for medical advice about side effects. You may report side effects to FDA at 1-800-FDA-1088. Where should I keep my medicine? Keep out of the reach of children. Store at room temperature between 15 and 30 degrees C (59 and 86 degrees F). Keep container tightly closed. Throw away any unused medicine after the expiration date. NOTE: This sheet is a summary. It may not cover all possible information. If you have questions about this medicine, talk to your doctor, pharmacist, or health care provider.  2021 Elsevier/Gold Standard (2018-08-07 10:30:56) Cefdinir Capsules What is this medicine? CEFDINIR (SEF di ner) is a cephalosporin antibiotic. It treats some infections caused by bacteria. It will not work for colds, the flu, or other viruses. This medicine may be used for other purposes;  ask your health care provider or pharmacist if you have questions. COMMON BRAND NAME(S): Omnicef What should I tell my health care provider before I take this medicine? They need to know if you have any of these conditions:  bleeding disorder  kidney disease  stomach or intestine problems such as colitis  an unusual or allergic reaction to cefdinir, other penicillin or cephalosporin antibiotics, other medicines, foods, dyes, or preservatives  pregnant or trying to get pregnant  breast-feeding How should I use this medicine? Take this drug by mouth. Take it as directed on the prescription label at the same time every day. You can take it with or without food. If it upsets your stomach, take it with food. Take all of this drug unless your health care provider tells you to stop it early. Keep taking it even if you  think you are better. Take products with aluminum, magnesium, or iron in them at a different time of day than this drug. Take this drug 2 hours BEFORE or 2 hours AFTER these products. Talk to your health care provider about the use of this drug in children. While it may be prescribed for selected conditions, precautions do apply. Overdosage: If you think you have taken too much of this medicine contact a poison control center or emergency room at once. NOTE: This medicine is only for you. Do not share this medicine with others. What if I miss a dose? If you miss a dose, take it as soon as you can. If it is almost time for your next dose, take only that dose. Do not take double or extra doses. What may interact with this medicine?  antacids that contain aluminum or magnesium  iron supplements  other antibiotics  probenecid This list may not describe all possible interactions. Give your health care provider a list of all the medicines, herbs, non-prescription drugs, or dietary supplements you use. Also tell them if you smoke, drink alcohol, or use illegal drugs. Some items may  interact with your medicine. What should I watch for while using this medicine? Tell your health care provider if your symptoms do not start to get better or if they get worse. Do not treat diarrhea with over the counter products. Contact your health care provider if you have diarrhea that lasts more than 2 days or if it is severe and watery. This medicine may cause serious skin reactions. They can happen weeks to months after starting the medicine. Contact your health care provider right away if you notice fevers or flu-like symptoms with a rash. The rash may be red or purple and then turn into blisters or peeling of the skin. Or, you might notice a red rash with swelling of the face, lips or lymph nodes in your neck or under your arms. If you have diabetes, you may get a false-positive result for sugar in your urine. Check with your health care provider. What side effects may I notice from receiving this medicine? Side effects that you should report to your doctor or health care provider as soon as possible:  allergic reactions (skin rash, itching or hives; swelling of the face, lips, or tongue)  bloody or watery diarrhea  fever  kidney injury (trouble passing urine or change in the amount of urine)  redness, blistering, peeling, or loosening of the skin, including inside the mouth  seizures  trouble breathing  unusual bruising or bleeding  unusually weak or tired Side effects that usually do not require medical attention (report to your doctor or health care provider if they continue or are bothersome):  diarrhea  dry mouth  headache  loss of appetite  nausea, vomiting  stomach pain  unusual vaginal discharge, itching, or odor This list may not describe all possible side effects. Call your doctor for medical advice about side effects. You may report side effects to FDA at 1-800-FDA-1088. Where should I keep my medicine? Keep out of the reach of children and pets. Store  at room temperature between 20 and 25 degrees C (68 and 77 degrees F). Throw away any unused drug after the expiration date. NOTE: This sheet is a summary. It may not cover all possible information. If you have questions about this medicine, talk to your doctor, pharmacist, or health care provider.  2021 Elsevier/Gold Standard (2019-09-04 10:20:34) Earache, Adult An earache, or ear  pain, can be caused by many things, including:  An infection.  Ear wax buildup.  Ear pressure.  Something in the ear that should not be there (foreign body).  A sore throat.  Tooth problems.  Jaw problems. Treatment of the earache will depend on the cause. If the cause is not clear or cannot be determined, you may need to watch your symptoms until your earache goes away or until a cause is found. Follow these instructions at home: Medicines  Take or apply over-the-counter and prescription medicines only as told by your health care provider.  If you were prescribed an antibiotic medicine, use it as told by your health care provider. Do not stop using the antibiotic even if you start to feel better.  Do not put anything in your ear other than medicine that is prescribed by your health care provider. Managing pain If directed, apply heat to the affected area as often as told by your health care provider. Use the heat source that your health care provider recommends, such as a moist heat pack or a heating pad.  Place a towel between your skin and the heat source.  Leave the heat on for 20-30 minutes.  Remove the heat if your skin turns bright red. This is especially important if you are unable to feel pain, heat, or cold. You may have a greater risk of getting burned. If directed, put ice on the affected area as often as told by your health care provider. To do this:  Put ice in a plastic bag.  Place a towel between your skin and the bag.  Leave the ice on for 20 minutes, 2-3 times a day.       General instructions  Pay attention to any changes in your symptoms.  Try resting in an upright position instead of lying down. This may help to reduce pressure in your ear and relieve pain.  Chew gum if it helps to relieve your ear pain.  Treat any allergies as told by your health care provider.  Drink enough fluid to keep your urine pale yellow.  It is up to you to get the results of any tests that were done. Ask your health care provider, or the department that is doing the tests, when your results will be ready.  Keep all follow-up visits as told by your health care provider. This is important. Contact a health care provider if:  Your pain does not improve within 2 days.  Your earache gets worse.  You have new symptoms.  You have a fever. Get help right away if you:  Have a severe headache.  Have a stiff neck.  Have trouble swallowing.  Have redness or swelling behind your ear.  Have fluid or blood coming from your ear.  Have hearing loss.  Feel dizzy. Summary  An earache, or ear pain, can be caused by many things.  Treatment of the earache will depend on the cause. Follow recommendations from your health care provider to treat your ear pain.  If the cause is not clear or cannot be determined, you may need to watch your symptoms until your earache goes away or until a cause is found.  Keep all follow-up visits as told by your health care provider. This is important. This information is not intended to replace advice given to you by your health care provider. Make sure you discuss any questions you have with your health care provider. Document Revised: 06/13/2019 Document Reviewed: 06/13/2019 Elsevier Patient  Education  2021 [place] Can Do to Manage Your COVID-19 Symptoms at Home If you have possible or confirmed COVID-19: 1. Stay home except to get medical care. 2. Monitor your symptoms carefully. If your symptoms get worse, call your  healthcare provider immediately. 3. Get rest and stay hydrated. 4. If you have a medical appointment, call the healthcare provider ahead of time and tell them that you have or may have COVID-19. 5. For medical emergencies, call 911 and notify the dispatch personnel that you have or may have COVID-19. 6. Cover your cough and sneezes with a tissue or use the inside of your elbow. 7. Wash your hands often with soap and water for at least 20 seconds or clean your hands with an alcohol-based hand sanitizer that contains at least 60% alcohol. 8. As much as possible, stay in a specific room and away from other people in your home. Also, you should use a separate bathroom, if available. If you need to be around other people in or outside of the home, wear a mask. 9. Avoid sharing personal items with other people in your household, like dishes, towels, and bedding. 10. Clean all surfaces that are touched often, like counters, tabletops, and doorknobs. Use household cleaning sprays or wipes according to the label instructions. [email or web address] 06/03/2020 This information is not intended to replace advice given to you by your health care provider. Make sure you discuss any questions you have with your health care provider. Document Revised: 09/19/2020 Document Reviewed: 09/19/2020 Elsevier Patient Education  2021 [employer].

## 2021-04-11 NOTE — Progress Notes (Signed)
Virtual Visit via Telephone  Note  I connected with Jocelyn Elliott on 04/11/21 at  1:30 PM EDT by a video enabled telemedicine application and verified that I am speaking with the correct person using two identifiers. Parties involved in visit as below:   Location: Patient: at home  Provider: Provider: Provider's office at  [facility], [place].   Allergies  Allergen Reactions  . Ciprofloxacin   . Levaquin [Levofloxacin]   . Propofol Other (See Comments)  . Tequin [Gatifloxacin] Hives and Rash     I discussed the limitations of evaluation and management by telemedicine and the availability of in person appointments. The patient expressed understanding and agreed to proceed.  History of Present Illness: Patient was positive for Covid 5/23 and symptoms started on 5/ Saturday.  Patient with headache, cough, clear sputum. Congestion.  Cough seems more congested today  per daughter today, yellow/ white sputum post nasal drip, sinus pressure, head congestion is the worse,.  She has a lot of ear pressure and some pain.   Daughter has a pulse oximetry at her house and will check and let office know levels. She has not brought it to her mothers house. She lives beside her daughter and she is checking in on her frequently.  Denies any recent falls or head injury. Last fall was in April.  She has taken Tylenol PRN for headache and it does help. Mucinex. Blood sugars are around 110-120.  Eating and drinking.  Body aches.  heart rate is within normal per patients daughter is 11.   Bowels within normal limits.  Patient  denies any fever,chills, rash, chest pain, shortness of breath, nausea, vomiting, or diarrhea.  Denies dizziness, lightheadedness, pre syncopal or syncopal episodes.   Significant history is she was recently evaluated and presumed to have TIA, was seen by Dr Nicki Reaper, on 04/04/21 for follow up. She was going to therapy, using her cane, has had no  other episodes and today denies any new symptoms other than recent Covid diagnosis.   She does have history of a fib. Sees cardiology.   Observations/Objective:  Patient is alert and oriented and responsive to questions Engages in conversation with provider. Speaks in full sentences without any pauses without any shortness of breath or distress. Daughter is on speaker phone.  Assessment and Plan: The primary encounter diagnosis was Ear pain, left. Diagnoses of Sinus headache, Post-nasal drip, Cough, and COVID-19 were also pertinent to this visit.  Positive covid mild symptoms reported. Declined antiviral at this time.   Will give coverage for bacterial ear infection since unable to evaluate ear in person.   Meds ordered this encounter  Medications    . predniSONE (DELTASONE) 10 MG tablet    Sig: Take 4 tablets x 1 day and then decrease by 1/2 tablet per day until down to zero mg.    Dispense:  18 tablet    Refill:  0  . chlorpheniramine-HYDROcodone (TUSSIONEX PENNKINETIC ER) 10-8 MG/5ML SUER    Sig: Take 2.5-5 mLs by mouth at bedtime as needed for cough.    Dispense:  115 mL    Refill:  0  . cefdinir (OMNICEF) 300 MG capsule    Sig: Take 1 capsule (300 mg total) by mouth 2 (two) times daily.    Dispense:  20 capsule    Refill:  0    Please give Cefdnir and cancel azithromycin.   Monitor blood pressure with steroid and tussionex at bedtime only, lowest  possible dose recommended.  Continue ASA 81 mg po once daily. Oral hydration/ rest. Nasal saline ok to use per package for sinus pressure.  Parameters for pulse oximetry/ and heart rate  discussed and when to seek care in person.  Symptomatic treatment is preferred by patient and daughter.  Follow Up Instructions:  Advised in person evaluation at anytime is advised if any symptoms do not improve, worsen or change at any given time.  Red Flags discussed. The patient was given clear instructions to go to ER or return to medical  center if any red flags develop, symptoms do not improve, worsen or new problems develop. They verbalized understanding.    I discussed the assessment and treatment plan with the patient. The patient was provided an opportunity to ask questions and all were answered. The patient agreed with the plan and demonstrated an understanding of the instructions.   The patient was advised to call back or seek an in-person evaluation if the symptoms worsen or if the condition fails to improve as anticipated.  I provided 22 minutes of non-face-to-face time during this encounter.   Marcille Buffy, FNP

## 2021-04-11 NOTE — Telephone Encounter (Signed)
Jocelyn Elliott saw Jocelyn Elliott 04/11/21.  Please call and check on her.  She was to check pulse ox.  Confirm breathing ok and feeling ok.  Any change or worsening symptoms, she needs to be evaluated.

## 2021-04-11 NOTE — Telephone Encounter (Signed)
Called and spoke to [place] at [facility]. I confirmed that they only have the Cefdinir. He states that the Azithromycin must have already been cancelled as he does not see it on the active list for the patient. Provider has been notified that azithromycin has been removed.

## 2021-04-11 NOTE — Progress Notes (Signed)
See phone note

## 2021-04-12 ENCOUNTER — Ambulatory Visit: Payer: Medicare Other

## 2021-04-12 ENCOUNTER — Telehealth: Payer: Self-pay | Admitting: Internal Medicine

## 2021-04-12 NOTE — Telephone Encounter (Signed)
Called patient. Unable to leave message.

## 2021-04-12 NOTE — Telephone Encounter (Signed)
She had called in on 04/12/21 and left a pulse reading of 70 and a Oxygen reading of 94. Called and spoke to [place] and she states that she is doing fine but has a constant headache and some upper back pain. She states that this has been the same since 04/11/21 and that it is the only thing bothering her. She declines going to the urgent care and states that she is fine. She is not having trouble breathing and states that her taste and smell are both ok. Patient is currently sitting at her table and drinking fluids.

## 2021-04-12 NOTE — Telephone Encounter (Signed)
Called patients daughter because patients home phone is not working. Patients daughter stated that she is doing some better today. She is taking abx but did not want to take cough meds after reading side effects. Using delsym and it seems to be working. Daughter gave pt a pulse ox. Oxygen levels were 96%-97% last night. No trouble breathing. Patient will be evaluated if symptoms change or worsen. Advised daughter to call if they need anything.

## 2021-04-12 NOTE — Telephone Encounter (Signed)
Called and spoke to Patient. See telephone note created by Dr. Nicki Reaper on 04/11/2021

## 2021-04-12 NOTE — Telephone Encounter (Signed)
patient called in with oxygen reading 70/94

## 2021-04-13 NOTE — Telephone Encounter (Signed)
Reviewed notes.  Can take tylenol.  Please call and confirm eating, breathing ok.  Let us know if persistent pain or problems.  Any worsening change or problems, needs to be evaluated.

## 2021-04-13 NOTE — Telephone Encounter (Signed)
Patient aware of below and said she is getting better each day. She will let us know if she needs anything

## 2021-04-19 ENCOUNTER — Ambulatory Visit: Payer: Medicare Other | Admitting: Physical Therapy

## 2021-04-19 NOTE — Telephone Encounter (Signed)
Scheduled for 12 on Friday. Patient declined 8am appt.

## 2021-04-19 NOTE — Telephone Encounter (Signed)
Ok to work in - virtual visit.

## 2021-04-19 NOTE — Telephone Encounter (Signed)
Called patient for more info. She is coughing, congested. Cough is productive- yellowish phlegm. No fever, SOB, chest tightness, chills, etc. She is taking mucinex and delsym and tylenol. She has 2 more days of omnicef, completed prednisone taper. Feels a lot better than she did when starting abx but wanted to be sure she did not need something to get it cleared up completely.

## 2021-04-19 NOTE — Telephone Encounter (Signed)
Pt called and wanted to let Dr. Nicki Reaper know that she is still very congested and wanted to have another antibiotic called in

## 2021-04-21 ENCOUNTER — Encounter: Payer: Self-pay | Admitting: Internal Medicine

## 2021-04-21 ENCOUNTER — Telehealth (INDEPENDENT_AMBULATORY_CARE_PROVIDER_SITE_OTHER): Payer: Medicare Other | Admitting: Internal Medicine

## 2021-04-21 DIAGNOSIS — I779 Disorder of arteries and arterioles, unspecified: Secondary | ICD-10-CM | POA: Diagnosis not present

## 2021-04-21 DIAGNOSIS — U071 COVID-19: Secondary | ICD-10-CM | POA: Diagnosis not present

## 2021-04-21 DIAGNOSIS — I700 Atherosclerosis of aorta: Secondary | ICD-10-CM

## 2021-04-21 DIAGNOSIS — I4891 Unspecified atrial fibrillation: Secondary | ICD-10-CM

## 2021-04-21 DIAGNOSIS — I952 Hypotension due to drugs: Secondary | ICD-10-CM | POA: Diagnosis not present

## 2021-04-21 DIAGNOSIS — I25119 Atherosclerotic heart disease of native coronary artery with unspecified angina pectoris: Secondary | ICD-10-CM

## 2021-04-21 DIAGNOSIS — R059 Cough, unspecified: Secondary | ICD-10-CM

## 2021-04-21 DIAGNOSIS — E1159 Type 2 diabetes mellitus with other circulatory complications: Secondary | ICD-10-CM

## 2021-04-21 MED ORDER — ALBUTEROL SULFATE HFA 108 (90 BASE) MCG/ACT IN AERS: 2.0000 | INHALATION_SPRAY | Freq: Four times a day (QID) | RESPIRATORY_TRACT | 1 refills | Status: DC | PRN

## 2021-04-21 MED ORDER — AZITHROMYCIN 250 MG PO TABS: ORAL_TABLET | ORAL | 0 refills | Status: AC

## 2021-04-21 MED ORDER — PREDNISONE 10 MG PO TABS: ORAL_TABLET | ORAL | 0 refills | Status: DC

## 2021-04-21 NOTE — Progress Notes (Signed)
Patient ID: Jocelyn Elliott, female   DOB: 09/29/1932, 87 y.o.   MRN: [id number]   Virtual Visit via video Note  This visit type was conducted due to national recommendations for restrictions regarding the COVID-19 pandemic (e.g. social distancing).  This format is felt to be most appropriate for this patient at this time.  All issues noted in this document were discussed and addressed.  No physical exam was performed (except for noted visual exam findings with Video Visits).   I connected with [employer] by a video enabled telemedicine application and verified that I am speaking with the correct person using two identifiers. Location patient: home Location provider: work Persons participating in the virtual visit: patient, provider  The limitations, risks, security and privacy concerns of performing an evaluation and management service by video and the availability of in person appointments have been discussed.  It has also been discussed with the patient that there may be a patient responsible charge related to this service. The patient expressed understanding and agreed to proceed.   Reason for visit: work in appt  HPI: Work in with concerns regarding persistent cough and congestion.  Tested positive for covid 04/10/21 and symptoms started 04/08/21.  Symptoms:  Headache, cough, congestion and cough. Was given omnicef, steroids and tussionex.  Symptoms have improved some, but still reports increased head and chest congestion.  Headache - tylenol helps.  Yellow mucus production with increased cough.  Minimal drainage.  No sore throat.  Using spirometer.  Blood sugar has been doing well.  Blood pressures - 62I systolic.  No dizziness or light headedness.  Taking mucinex and delsym.  Pulse ox curently 96%.  No chest tightness or sob.      ROS: See pertinent positives and negatives per HPI.  Past Medical History:  Diagnosis Date  . Arthritis   . AV malformation of gastrointestinal tract   .  Blood in stool   . CAD (coronary artery disease)   . Carotid arterial disease ([place])   . Cataracts, bilateral   . Chronic blood loss anemia   . Chronic cystitis   . Complication of anesthesia    reaction to propofol  . Depression   . Diabetes mellitus ([place])   . GERD (gastroesophageal reflux disease)   . Hyperlipidemia   . Hypertension   . IDA (iron deficiency anemia)   . Stress incontinence     Past Surgical History:  Procedure Laterality Date  . ABDOMINAL HYSTERECTOMY  1972   ovaries left in place  . APPENDECTOMY  1992  . BACK SURGERY  06/08/2010   L3, L4, L5  . CAROTID ARTERY ANGIOPLASTY  11/03  . [place], 92   right then left   . CHOLECYSTECTOMY  1993  . COLONOSCOPY WITH PROPOFOL N/A 02/02/2019   Procedure: COLONOSCOPY WITH PROPOFOL;  Surgeon: Manya Silvas, MD;  Location: [facility] ENDOSCOPY;  Service: Endoscopy;  Laterality: N/A;  . ESOPHAGOGASTRODUODENOSCOPY N/A 02/02/2019   Procedure: ESOPHAGOGASTRODUODENOSCOPY (EGD);  Surgeon: Manya Silvas, MD;  [facility] ENDOSCOPY;  Service: Endoscopy;  Laterality: N/A;  . FOOT SURGERY  06/2007   Left  . right carotid artery surgery  01/09/13   Dr. Delana Meyer @ [facility]  . TOTAL HIP ARTHROPLASTY  05/03/2011   left 12, right 11/07    Family History  Problem Relation Age of Onset  . Hyperlipidemia Father   . Heart disease Father        myocardial infarction  . Hypertension Father  Parent  . Arthritis Other        parent  . Diabetes Other        nephew  . Cervical cancer Sister   . Rectal cancer Sister   . Breast cancer Neg Hx     SOCIAL HX: reviewed.    Current Outpatient Medications:  .  albuterol (VENTOLIN HFA) 108 (90 Base) MCG/ACT inhaler, Inhale 2 puffs into the lungs every 6 (six) hours as needed for wheezing or shortness of breath., Disp: 18 g, Rfl: 1 .  azithromycin (ZITHROMAX) 250 MG tablet, Take 2 tablets on day 1, then 1 tablet daily on days 2 through 5, Disp: 6 tablet, Rfl: 0 .   aspirin 81 MG tablet, Take 81 mg by mouth daily., Disp: , Rfl:  .  Biotin 5000 MCG CAPS, Take 1 capsule by mouth daily., Disp: , Rfl:  .  blood glucose meter kit and supplies KIT, Dispense based on insurance preference. Use daily to check sugars once daily. Dx e11.9, Disp: 1 each, Rfl: 0 .  cetirizine (ZYRTEC) 10 MG tablet, Take 10 mg by mouth daily., Disp: , Rfl:  .  ferrous sulfate 325 (65 FE) MG EC tablet, Take 325 mg by mouth daily with breakfast. Every other day, Disp: , Rfl:  .  isosorbide mononitrate (IMDUR) 30 MG 24 hr tablet, Take 30 mg by mouth daily. , Disp: , Rfl:  .  Lancets (ONETOUCH DELICA PLUS [id number]) MISC, USE AS INSTRUCTED TO CHECK BLOOD SUGARS ONCE DAILY. DX E 11.9, Disp: 100 each, Rfl: 12 .  metFORMIN (GLUCOPHAGE) 500 MG tablet, TAKE 1 TABLET BY MOUTH EVERY DAY WITH BREAKFAST, Disp: 90 tablet, Rfl: 1 .  Omega-3 Fatty Acids (FISH OIL) 1200 MG CAPS, Take 1 capsule by mouth daily., Disp: , Rfl:  .  ONETOUCH ULTRA test strip, USE AS INSTRUCTED TO CHECK BLOOD SUGARS ONCE DAILY. DX E11.9, Disp: 100 strip, Rfl: 12 .  predniSONE (DELTASONE) 10 MG tablet, Take 4 tablets x 1 day and then decrease by 1/2 tablet per day until down to zero mg., Disp: 18 tablet, Rfl: 0 .  rosuvastatin (CRESTOR) 40 MG tablet, TAKE 1 TABLET BY MOUTH EVERY DAY, Disp: 30 tablet, Rfl: 0 .  traZODone (DESYREL) 50 MG tablet, Take 0.5-1 tablets (25-50 mg total) by mouth at bedtime as needed for sleep., Disp: 90 tablet, Rfl: 1  EXAM:  VITALS per patient if applicable: pulse ox 65%  GENERAL: alert, oriented, appears well and in no acute distress  HEENT: atraumatic, conjunttiva clear, no obvious abnormalities on inspection of external nose and ears  NECK: normal movements of the head and neck  LUNGS: on inspection no signs of respiratory distress, breathing rate appears normal, no obvious gross SOB, gasping or wheezing. Some minimal increased cough with forced expiration.   CV: no obvious  cyanosis  PSYCH/NEURO: pleasant and cooperative, no obvious depression or anxiety, speech and thought processing grossly intact  ASSESSMENT AND PLAN:  Discussed the following assessment and plan:  Problem List Items Addressed This Visit    Afib ([place])    Continues on aspirin.  Denies any increased heart rate or palpitations.  Follow.       Aortic atherosclerosis (HCC)    Continue crestor.         Carotid arterial disease ([place])    Evaluated by Dr Lucky Cowboy - bilateral - <50% 08/2020. Continue crestor and aspirin.  Continue f/u with [facility].       Coronary artery disease with angina pectoris ([place])  Continue imdur and crestor.  Continue risk factor modification.  No chest pain.  Follow.       Cough   Relevant Medications   predniSONE (DELTASONE) 10 MG tablet   COVID-19    With persistent increased cough and congestion - productive of yellow mucus.  Symptoms as outlined.  No sob.  No chest tightness.  Continue mucinex and delsym.  Albuterol inhaler as directed.  zpak and prednisone taper.  Follow closely.  If symptoms change or worsen, she is to be evaluated.        Relevant Medications   azithromycin (ZITHROMAX) 250 MG tablet   predniSONE (DELTASONE) 10 MG tablet   Diabetes mellitus (HCC)    States sugars are doing well.  Continue low carb diet and exercise.  Follow met b and a1c.       Disorder of artery or arteriole ([place])    Has been evaluated and followed by [facility].  Continue risk factor modification.  Continue daily aspirin.       Hypotension    Off ramipril.  No light headedness or dizziness.  Stay hydrated.  Follow pressures.           Return if symptoms worsen or fail to improve.   I discussed the assessment and treatment plan with the patient. The patient was provided an opportunity to ask questions and all were answered. The patient agreed with the plan and demonstrated an understanding of the instructions.   The patient was advised to call back or seek an in-person  evaluation if the symptoms worsen or if the condition fails to improve as anticipated.   Einar Pheasant, MD

## 2021-04-23 ENCOUNTER — Encounter: Payer: Self-pay | Admitting: Internal Medicine

## 2021-04-23 NOTE — Assessment & Plan Note (Signed)
Has been evaluated and followed by [facility].  Continue risk factor modification.  Continue daily aspirin.

## 2021-04-23 NOTE — Assessment & Plan Note (Signed)
States sugars are doing well.  Continue low carb diet and exercise.  Follow met b and a1c.

## 2021-04-23 NOTE — Assessment & Plan Note (Signed)
Off ramipril.  No light headedness or dizziness.  Stay hydrated.  Follow pressures.

## 2021-04-23 NOTE — Assessment & Plan Note (Signed)
With persistent increased cough and congestion - productive of yellow mucus.  Symptoms as outlined.  No sob.  No chest tightness.  Continue mucinex and delsym.  Albuterol inhaler as directed.  zpak and prednisone taper.  Follow closely.  If symptoms change or worsen, she is to be evaluated.

## 2021-04-23 NOTE — Assessment & Plan Note (Signed)
Continues on aspirin.  Denies any increased heart rate or palpitations.  Follow.

## 2021-04-23 NOTE — Assessment & Plan Note (Signed)
Continue imdur and crestor.  Continue risk factor modification.  No chest pain.  Follow.

## 2021-04-23 NOTE — Assessment & Plan Note (Signed)
Evaluated by Dr Lucky Cowboy - bilateral - <50% 08/2020. Continue crestor and aspirin.  Continue f/u with [facility].

## 2021-04-23 NOTE — Assessment & Plan Note (Addendum)
Continue crestor 

## 2021-04-24 ENCOUNTER — Encounter: Payer: Medicare Other | Admitting: Physical Therapy

## 2021-04-26 ENCOUNTER — Encounter: Payer: Medicare Other | Admitting: Physical Therapy

## 2021-05-01 ENCOUNTER — Encounter: Payer: Medicare Other | Admitting: Physical Therapy

## 2021-05-03 ENCOUNTER — Other Ambulatory Visit: Payer: Self-pay | Admitting: Internal Medicine

## 2021-05-03 ENCOUNTER — Encounter: Payer: Medicare Other | Admitting: Physical Therapy

## 2021-06-01 ENCOUNTER — Other Ambulatory Visit: Payer: Self-pay | Admitting: Internal Medicine

## 2021-06-15 ENCOUNTER — Other Ambulatory Visit: Payer: Self-pay | Admitting: Internal Medicine

## 2021-06-25 ENCOUNTER — Other Ambulatory Visit: Payer: Self-pay | Admitting: Internal Medicine

## 2021-06-26 ENCOUNTER — Encounter: Payer: Self-pay | Admitting: Internal Medicine

## 2021-06-26 ENCOUNTER — Inpatient Hospital Stay (HOSPITAL_BASED_OUTPATIENT_CLINIC_OR_DEPARTMENT_OTHER): Payer: Medicare Other | Admitting: Internal Medicine

## 2021-06-26 ENCOUNTER — Inpatient Hospital Stay: Payer: Medicare Other

## 2021-06-26 ENCOUNTER — Inpatient Hospital Stay: Payer: Medicare Other | Attending: Internal Medicine

## 2021-06-26 VITALS — BP 113/55 | HR 77 | Temp 96.9°F | Resp 14 | Wt 136.4 lb

## 2021-06-26 VITALS — BP 135/74 | HR 78 | Temp 97.1°F | Resp 17

## 2021-06-26 DIAGNOSIS — D631 Anemia in chronic kidney disease: Secondary | ICD-10-CM

## 2021-06-26 DIAGNOSIS — G459 Transient cerebral ischemic attack, unspecified: Secondary | ICD-10-CM | POA: Diagnosis not present

## 2021-06-26 DIAGNOSIS — N183 Chronic kidney disease, stage 3 unspecified: Secondary | ICD-10-CM | POA: Insufficient documentation

## 2021-06-26 DIAGNOSIS — D508 Other iron deficiency anemias: Secondary | ICD-10-CM

## 2021-06-26 DIAGNOSIS — Z7902 Long term (current) use of antithrombotics/antiplatelets: Secondary | ICD-10-CM | POA: Insufficient documentation

## 2021-06-26 LAB — CBC WITH DIFFERENTIAL/PLATELET
Abs Immature Granulocytes: 0.01 10*3/uL (ref 0.00–0.07)
Basophils Absolute: 0.1 10*3/uL (ref 0.0–0.1)
Basophils Relative: 1 %
Eosinophils Absolute: 0.3 10*3/uL (ref 0.0–0.5)
Eosinophils Relative: 4 %
HCT: 31.2 % — ABNORMAL LOW (ref 36.0–46.0)
Hemoglobin: 10.4 g/dL — ABNORMAL LOW (ref 12.0–15.0)
Immature Granulocytes: 0 %
Lymphocytes Relative: 24 %
Lymphs Abs: 1.7 10*3/uL (ref 0.7–4.0)
MCH: 33.7 pg (ref 26.0–34.0)
MCHC: 33.3 g/dL (ref 30.0–36.0)
MCV: 101 fL — ABNORMAL HIGH (ref 80.0–100.0)
Monocytes Absolute: 0.5 10*3/uL (ref 0.1–1.0)
Monocytes Relative: 7 %
Neutro Abs: 4.5 10*3/uL (ref 1.7–7.7)
Neutrophils Relative %: 64 %
Platelets: 153 10*3/uL (ref 150–400)
RBC: 3.09 MIL/uL — ABNORMAL LOW (ref 3.87–5.11)
RDW: 12.8 % (ref 11.5–15.5)
WBC: 7 10*3/uL (ref 4.0–10.5)
nRBC: 0 % (ref 0.0–0.2)

## 2021-06-26 LAB — BASIC METABOLIC PANEL
Anion gap: 9 (ref 5–15)
BUN: 27 mg/dL — ABNORMAL HIGH (ref 8–23)
CO2: 25 mmol/L (ref 22–32)
Calcium: 9.1 mg/dL (ref 8.9–10.3)
Chloride: 104 mmol/L (ref 98–111)
Creatinine, Ser: 1.11 mg/dL — ABNORMAL HIGH (ref 0.44–1.00)
GFR, Estimated: 48 mL/min — ABNORMAL LOW (ref >60)
Glucose, Bld: 125 mg/dL — ABNORMAL HIGH (ref 70–99)
Potassium: 4 mmol/L (ref 3.5–5.1)
Sodium: 138 mmol/L (ref 135–145)

## 2021-06-26 MED ORDER — SODIUM CHLORIDE 0.9 % IV SOLN
510.0000 mg | Freq: Once | INTRAVENOUS | Status: AC
  Administered 2021-06-26: 510 mg via INTRAVENOUS
  Filled 2021-06-26: qty 510

## 2021-06-26 MED ORDER — SODIUM CHLORIDE 0.9 % IV SOLN
Freq: Once | INTRAVENOUS | Status: AC
  Filled 2021-06-26: qty 250

## 2021-06-26 NOTE — Progress Notes (Signed)
[place] OFFICE PROGRESS NOTE  Patient Care Team: Einar Pheasant, MD as PCP - General (Internal Medicine) Einar Pheasant, MD (Internal Medicine) Isaias Cowman, MD (Internal Medicine) Albesa Seen, MD (Unknown Physician Specialty) Brendolyn Patty, MD (Specialist) Schnier, Dolores Lory, MD (Vascular Surgery) Leia Alf, MD (Inactive) as Referring Physician (Internal Medicine) Isaias Cowman, MD (Internal Medicine) Albesa Seen, MD (Unknown Physician Specialty) Philis Kendall, MD (Unknown Physician Specialty) Brendolyn Patty, MD (Specialist) Schnier, Dolores Lory, MD (Vascular Surgery)   SUMMARY OF HEMATOLOGIC HISTORY:  # IRON DEFICIENCY ANEMIA- recurrent [? GI blood loss; Dr.Elliot; AVM-capsule]; IV Ferrahem q 5m last colo- 2012/march; EGD-- [id number]  July 2020 bone marrow biopsy-mild dysplastic changes; 40% hypercellularity; cytogenetics-normal.-Retacrit.  #Colonoscopy-April 2020 aborted because of cardiac pause.  # CKD stage III-IV; CAD-Dr. PSaralyn Pilar  March 30th, 2022-TIA/slurred spleech [Dr.Shah]  INTERVAL HISTORY:   87-year-old female patient with above history of recurrent iron deficiency anemia unclear etiology -related to CKD is here for follow-up.  In the interim patient was diagnosed with COVID.  However did not have to have been hospitalized.  No blood in stools black or stools no nausea no vomiting.   Review of Systems  Constitutional:  Positive for malaise/fatigue. Negative for chills, diaphoresis, fever and weight loss.  HENT:  Negative for nosebleeds and sore throat.   Eyes:  Negative for double vision.  Respiratory:  Negative for cough, hemoptysis, sputum production and wheezing.   Cardiovascular:  Negative for palpitations, orthopnea and leg swelling.  Gastrointestinal:  Negative for abdominal pain, blood in stool, constipation, diarrhea, heartburn, melena, nausea and vomiting.  Genitourinary:  Negative for dysuria, frequency and  urgency.  Musculoskeletal:  Negative for back pain and joint pain.  Skin: Negative.  Negative for itching and rash.  Neurological:  Negative for dizziness, tingling, focal weakness, weakness and headaches.  Endo/Heme/Allergies:  Does not bruise/bleed easily.  Psychiatric/Behavioral:  Negative for depression. The patient is not nervous/anxious and does not have insomnia.     PAST MEDICAL HISTORY :  Past Medical History:  Diagnosis Date   Arthritis    AV malformation of gastrointestinal tract    Blood in stool    CAD (coronary artery disease)    Carotid arterial disease (HCC)    Cataracts, bilateral    Chronic blood loss anemia    Chronic cystitis    Complication of anesthesia    reaction to propofol   Depression    Diabetes mellitus (HCC)    GERD (gastroesophageal reflux disease)    Hyperlipidemia    Hypertension    IDA (iron deficiency anemia)    Stress incontinence     PAST SURGICAL HISTORY :   Past Surgical History:  Procedure Laterality Date   ABDOMINAL HYSTERECTOMY  1972   ovaries left in place   [place] 06/08/2010   L3, L4, L5   CAROTID ARTERY ANGIOPLASTY  11/03   CARPAL TUNNEL RELEASE  1991, 92   right then left    CHOLECYSTECTOMY  1993   COLONOSCOPY WITH PROPOFOL N/A 02/02/2019   Procedure: COLONOSCOPY WITH PROPOFOL;  Surgeon: EManya Silvas MD;  [facility]  Service: Endoscopy;  Laterality: N/A;   ESOPHAGOGASTRODUODENOSCOPY N/A 02/02/2019   Procedure: ESOPHAGOGASTRODUODENOSCOPY (EGD);  Surgeon: EManya Silvas MD;  [facility];  Service: Endoscopy;  Laterality: N/A;   FOOT SURGERY  06/2007   Left   right carotid artery surgery  01/09/13   Dr. SDelana Meyer@ [facility]   TOTAL HIP  ARTHROPLASTY  05/03/2011   left 12, right 11/07    FAMILY HISTORY :   Family History  Problem Relation Age of Onset   Hyperlipidemia Father    Heart disease Father        myocardial infarction   Hypertension Father         Parent   Arthritis Other        parent   Diabetes Other        nephew   Cervical cancer Sister    Rectal cancer Sister    Breast cancer Neg Hx     SOCIAL HISTORY:   Social History   Tobacco Use   Smoking status: Former    Types: Cigarettes    Quit date: 11/19/1989    Years since quitting: 31.6   Smokeless tobacco: Never  Substance Use Topics   Alcohol use: No    Alcohol/week: 0.0 standard drinks   Drug use: No    ALLERGIES:  is allergic to ciprofloxacin, levaquin [levofloxacin], propofol, and tequin [gatifloxacin].  MEDICATIONS:  Current Outpatient Medications  Medication Sig Dispense Refill   albuterol (VENTOLIN HFA) 108 (90 Base) MCG/ACT inhaler TAKE 2 PUFFS BY MOUTH EVERY 6 HOURS AS NEEDED FOR WHEEZE OR SHORTNESS OF BREATH 6.7 each 1   aspirin 81 MG tablet Take 81 mg by mouth daily.     Biotin 5000 MCG CAPS Take 1 capsule by mouth daily.     blood glucose meter kit and supplies KIT Dispense based on insurance preference. Use daily to check sugars once daily. Dx e11.9 1 each 0   cetirizine (ZYRTEC) 10 MG tablet Take 10 mg by mouth daily.     ferrous sulfate 325 (65 FE) MG EC tablet Take 325 mg by mouth daily with breakfast. Every other day     Lancets (ONETOUCH DELICA PLUS [id number]) MISC USE AS INSTRUCTED TO CHECK BLOOD SUGARS ONCE DAILY. DX E 11.9 100 each 12   metFORMIN (GLUCOPHAGE) 500 MG tablet TAKE 1 TABLET BY MOUTH EVERY DAY WITH BREAKFAST 90 tablet 1   Omega-3 Fatty Acids (FISH OIL) 1200 MG CAPS Take 1 capsule by mouth daily.     ONETOUCH ULTRA test strip USE AS INSTRUCTED TO CHECK BLOOD SUGARS ONCE DAILY. DX E11.9 100 strip 12   rosuvastatin (CRESTOR) 40 MG tablet TAKE 1 TABLET BY MOUTH EVERY DAY 30 tablet 0   traZODone (DESYREL) 50 MG tablet TAKE 0.5-1 TABLETS (25-50 MG TOTAL) BY MOUTH AT BEDTIME AS NEEDED FOR SLEEP. 90 tablet 1   isosorbide mononitrate (IMDUR) 30 MG 24 hr tablet Take 30 mg by mouth daily.  (Patient not taking: Reported on 06/26/2021)     No  current facility-administered medications for this visit.    PHYSICAL EXAMINATION:   BP (!) 113/55   Pulse 77   Temp (!) 96.9 F (36.1 C)   Resp 14   Wt 136 lb 6.4 oz (61.9 kg)   SpO2 95%   BMI 27.55 kg/m   Filed Weights   06/26/21 1259  Weight: 136 lb 6.4 oz (61.9 kg)    Physical Exam Constitutional:      Comments: Frail-appearing Caucasian female patient.  She is in a wheelchair.  HENT:     Head: Normocephalic and atraumatic.     Mouth/Throat:     Pharynx: No oropharyngeal exudate.  Eyes:     Pupils: Pupils are equal, round, and reactive to light.     Comments: Positive for pallor.  Cardiovascular:     Rate  and Rhythm: Normal rate and regular rhythm.  Pulmonary:     Effort: No respiratory distress.     Breath sounds: No wheezing.  Abdominal:     General: Bowel sounds are normal. There is no distension.     Palpations: Abdomen is soft. There is no mass.     Tenderness: no abdominal tenderness There is no guarding or rebound.  Musculoskeletal:        General: No tenderness. Normal range of motion.     Cervical back: Normal range of motion and neck supple.  Skin:    General: Skin is warm.  Neurological:     Mental Status: She is alert and oriented to person, place, and time.  Psychiatric:        Mood and Affect: Affect normal.    LABORATORY DATA:  I have reviewed the data as listed    Component Value Date/Time   NA 138 06/26/2021 1242   NA 143 01/10/2013 0314   K 4.0 06/26/2021 1242   K 4.0 01/10/2013 0314   CL 104 06/26/2021 1242   CL 109 (H) 01/10/2013 0314   CO2 25 06/26/2021 1242   CO2 27 01/10/2013 0314   GLUCOSE 125 (H) 06/26/2021 1242   GLUCOSE 119 (H) 01/10/2013 0314   BUN 27 (H) 06/26/2021 1242   BUN 15 01/10/2013 0314   CREATININE 1.11 (H) 06/26/2021 1242   CREATININE 1.03 01/10/2013 0314   CREATININE 1.13 (H) 09/18/2012 0848   CALCIUM 9.1 06/26/2021 1242   CALCIUM 8.1 (L) 01/10/2013 0314   PROT 6.3 02/07/2021 1121   PROT 7.2  12/20/2011 1521   ALBUMIN 4.0 02/07/2021 1121   ALBUMIN 3.9 12/20/2011 1521   AST 29 02/07/2021 1121   AST 31 12/20/2011 1521   ALT 29 02/07/2021 1121   ALT 37 12/20/2011 1521   ALKPHOS 55 02/07/2021 1121   ALKPHOS 57 12/20/2011 1521   BILITOT 0.4 02/07/2021 1121   BILITOT 0.5 12/20/2011 1521   GFRNONAA 48 (L) 06/26/2021 1242   GFRNONAA 52 (L) 01/10/2013 0314   GFRNONAA 47 (L) 09/18/2012 0848   GFRAA 46 (L) 07/12/2020 1240   GFRAA >60 01/10/2013 0314   GFRAA 54 (L) 09/18/2012 0848    No results found for: SPEP, UPEP  Lab Results  Component Value Date   WBC 7.0 06/26/2021   NEUTROABS 4.5 06/26/2021   HGB 10.4 (L) 06/26/2021   HCT 31.2 (L) 06/26/2021   MCV 101.0 (H) 06/26/2021   PLT 153 06/26/2021      Chemistry      Component Value Date/Time   NA 138 06/26/2021 1242   NA 143 01/10/2013 0314   K 4.0 06/26/2021 1242   K 4.0 01/10/2013 0314   CL 104 06/26/2021 1242   CL 109 (H) 01/10/2013 0314   CO2 25 06/26/2021 1242   CO2 27 01/10/2013 0314   BUN 27 (H) 06/26/2021 1242   BUN 15 01/10/2013 0314   CREATININE 1.11 (H) 06/26/2021 1242   CREATININE 1.03 01/10/2013 0314   CREATININE 1.13 (H) 09/18/2012 0848      Component Value Date/Time   CALCIUM 9.1 06/26/2021 1242   CALCIUM 8.1 (L) 01/10/2013 0314   ALKPHOS 55 02/07/2021 1121   ALKPHOS 57 12/20/2011 1521   AST 29 02/07/2021 1121   AST 31 12/20/2011 1521   ALT 29 02/07/2021 1121   ALT 37 12/20/2011 1521   BILITOT 0.4 02/07/2021 1121   BILITOT 0.5 12/20/2011 1521  ASSESSMENT & PLAN:   Anemia of chronic kidney failure, stage 3 (moderate) (HCC) #Severe anemia hemoglobin-nadir 6 [summer 2020]. Likely CKD [July 2021- Bone marrow biopsy-mild dyserythropoietic changes].   #Hemoglobin today is  10.4- IV Feraheme today;  Continue PO iron/Vitron-C every other day FEB 2022- Iron studies: Saturation29; ferritin 76;   # CKD stage III-1.5- GFR- 35 STABLE;  Recommend continued p.o. intake.   # TIA: on  plavix [Dr.Shah]on PT.    # DISPOSITION:  # Ferrahem today # 1 week- Ferrahem.  # in 3 months- MD- labs- cbc/bmp;possible Ferrahem-/ Dr.B     Cammie Sickle, MD 06/26/2021 10:21 PM

## 2021-06-26 NOTE — Progress Notes (Signed)
About three months ago pt states she's noticed her ankles tend to swell up at times; sits more now vs walking. Looking for a lift chair to help her stand easier.

## 2021-06-26 NOTE — Assessment & Plan Note (Addendum)
#  Severe anemia hemoglobin-nadir 6 [summer 2020]. Likely CKD [July 2021- Bone marrow biopsy-mild dyserythropoietic changes].   #Hemoglobin today is  10.4- IV Feraheme today;  Continue PO iron/Vitron-C every other day FEB 2022- Iron studies: Saturation29; ferritin 76;   # CKD stage III-1.5- GFR- 35 STABLE;  Recommend continued p.o. intake.   # TIA: on plavix [Dr.Shah]on PT.    # DISPOSITION:  # Ferrahem today # 1 week- Ferrahem.  # in 3 months- MD- labs- cbc/bmp;possible Ferrahem-/ Dr.B

## 2021-06-26 NOTE — Patient Instructions (Signed)
Ferumoxytol injection What is this medication? FERUMOXYTOL is an iron complex. Iron is used to make healthy red blood cells, which carry oxygen and nutrients throughout the body. This medicine is used totreat iron deficiency anemia. This medicine may be used for other purposes; ask your health care provider orpharmacist if you have questions. COMMON BRAND NAME(S): Feraheme What should I tell my care team before I take this medication? They need to know if you have any of these conditions: anemia not caused by low iron levels high levels of iron in the blood magnetic resonance imaging (MRI) test scheduled an unusual or allergic reaction to iron, other medicines, foods, dyes, or preservatives pregnant or trying to get pregnant breast-feeding How should I use this medication? This medicine is for injection into a vein. It is given by a health careprofessional in a hospital or clinic setting. Talk to your pediatrician regarding the use of this medicine in children.Special care may be needed. Overdosage: If you think you have taken too much of this medicine contact apoison control center or emergency room at once. NOTE: This medicine is only for you. Do not share this medicine with others. What if I miss a dose? It is important not to miss your dose. Call your doctor or health careprofessional if you are unable to keep an appointment. What may interact with this medication? This medicine may interact with the following medications: other iron products This list may not describe all possible interactions. Give your health care provider a list of all the medicines, herbs, non-prescription drugs, or dietary supplements you use. Also tell them if you smoke, drink alcohol, or use illegaldrugs. Some items may interact with your medicine. What should I watch for while using this medication? Visit your doctor or healthcare professional regularly. Tell your doctor or healthcare professional if your  symptoms do not start to get better or if theyget worse. You may need blood work done while you are taking this medicine. You may need to follow a special diet. Talk to your doctor. Foods that contain iron include: whole grains/cereals, dried fruits, beans, or peas, leafy greenvegetables, and organ meats (liver, kidney). What side effects may I notice from receiving this medication? Side effects that you should report to your doctor or health care professionalas soon as possible: allergic reactions like skin rash, itching or hives, swelling of the face, lips, or tongue breathing problems changes in blood pressure feeling faint or lightheaded, falls fever or chills flushing, sweating, or hot feelings swelling of the ankles or feet Side effects that usually do not require medical attention (report to yourdoctor or health care professional if they continue or are bothersome): diarrhea headache nausea, vomiting stomach pain This list may not describe all possible side effects. Call your doctor for medical advice about side effects. You may report side effects to FDA at1-800-FDA-1088. Where should I keep my medication? This drug is given in a hospital or clinic and will not be stored at home. NOTE: This sheet is a summary. It may not cover all possible information. If you have questions about this medicine, talk to your doctor, pharmacist, orhealth care provider.  2022 Elsevier/Gold Standard (2016-12-24 20:21:10)  

## 2021-06-27 ENCOUNTER — Other Ambulatory Visit: Payer: Self-pay | Admitting: Internal Medicine

## 2021-07-05 ENCOUNTER — Other Ambulatory Visit: Payer: Self-pay

## 2021-07-05 ENCOUNTER — Inpatient Hospital Stay: Payer: Medicare Other

## 2021-07-05 VITALS — BP 113/70 | HR 89 | Temp 96.2°F | Resp 18

## 2021-07-05 DIAGNOSIS — N183 Chronic kidney disease, stage 3 unspecified: Secondary | ICD-10-CM | POA: Diagnosis not present

## 2021-07-05 DIAGNOSIS — D508 Other iron deficiency anemias: Secondary | ICD-10-CM

## 2021-07-05 MED ORDER — SODIUM CHLORIDE 0.9 % IV SOLN
510.0000 mg | Freq: Once | INTRAVENOUS | Status: AC
  Administered 2021-07-05: 510 mg via INTRAVENOUS
  Filled 2021-07-05: qty 510

## 2021-07-05 MED ORDER — SODIUM CHLORIDE 0.9 % IV SOLN
Freq: Once | INTRAVENOUS | Status: AC
Start: 2021-07-05 — End: 2021-07-05
  Filled 2021-07-05: qty 250

## 2021-07-05 NOTE — Patient Instructions (Signed)
[facility]  Discharge Instructions: Thank you for choosing [facility]Ferumox[facility] injection What is this medication? FERUMOXYTOL is an iron complex. Iron is used to make healthy red blood cells, which carry oxygen and nutrients throughout the body. This medicine is used totreat iron deficiency anemia. This medicine may be used for other purposes; ask your health care provider orpharmacist if you have questions. COMMON BRAND NAME(S): Feraheme What should I tell my care team before I take this medication? They need to know if you have any of these conditions: anemia not caused by low iron levels high levels of iron in the blood magnetic resonance imaging (MRI) test scheduled an unusual or allergic reaction to iron, other medicines, foods, dyes, or preservatives pregnant or trying to get pregnant breast-feeding How should I use this medication? This medicine is for injection into a vein. It is given by a health careprofessional in a hospital or clinic setting. Talk to your pediatrician regarding the use of this medicine in children.Special care may be needed. Overdosage: If you think you have taken too much of this medicine contact apoison control center or emergency room at once. NOTE: This medicine is only for you. Do not share this medicine with others. What if I miss a dose? It is important not to miss your dose. Call your doctor or health careprofessional if you are unable to keep an appointment. What may interact with this medication? This medicine may interact with the following medications: other iron products This list may not describe all possible interactions. Give your health care provider a list of all the medicines, herbs, non-prescription drugs, or dietary supplements you use. Also tell them if you smoke, drink alcohol, or use illegaldrugs. Some items may interact with your medicine. What should I watch for while using this  medication? Visit your doctor or healthcare professional regularly. Tell your doctor or healthcare professional if your symptoms do not start to get better or if theyget worse. You may need blood work done while you are taking this medicine. You may need to follow a special diet. Talk to your doctor. Foods that contain iron include: whole grains/cereals, dried fruits, beans, or peas, leafy greenvegetables, and organ meats (liver, kidney). What side effects may I notice from receiving this medication? Side effects that you should report to your doctor or health care professionalas soon as possible: allergic reactions like skin rash, itching or hives, swelling of the face, lips, or tongue breathing problems changes in blood pressure feeling faint or lightheaded, falls fever or chills flushing, sweating, or hot feelings swelling of the ankles or feet Side effects that usually do not require medical attention (report to yourdoctor or health care professional if they continue or are bothersome): diarrhea headache nausea, vomiting stomach pain This list may not describe all possible side effects. Call your doctor for medical advice about side effects. You may report side effects to FDA at1-800-FDA-1088. Where should I keep my medication? This drug is given in a hospital or clinic and will not be stored at home. NOTE: This sheet is a summary. It may not cover all possible information. If you have questions about this medicine, talk to your doctor, pharmacist, orhealth care provider.  2022 Elsevier/Gold Standard (2016-12-24 20:21:10) nter to provide your oncology and hematology care.  If you have a lab appointment with the [facility], please go directly to the [facility] and check in at the registration area.  Wear comfortable clothing and clothing appropriate  for easy access to any Portacath or PICC line.   We strive to give you quality time with your provider. You may need to reschedule  your appointment if you arrive late (15 or more minutes).  Arriving late affects you and other patients whose appointments are after yours.  Also, if you miss three or more appointments without notifying the office, you may be dismissed from the clinic at the provider's discretion.      For prescription refill requests, have your pharmacy contact our office and allow 72 hours for refills to be completed.    Today you received the following chemotherapy and/or immunotherapy agents feraheme.   To help prevent nausea and vomiting after your treatment, we encourage you to take your nausea medication as directed.  BELOW ARE SYMPTOMS THAT SHOULD BE REPORTED IMMEDIATELY: *FEVER GREATER THAN 100.4 F (38 C) OR HIGHER *CHILLS OR SWEATING *NAUSEA AND VOMITING THAT IS NOT CONTROLLED WITH YOUR NAUSEA MEDICATION *UNUSUAL SHORTNESS OF BREATH *UNUSUAL BRUISING OR BLEEDING *URINARY PROBLEMS (pain or burning when urinating, or frequent urination) *BOWEL PROBLEMS (unusual diarrhea, constipation, pain near the anus) TENDERNESS IN MOUTH AND THROAT WITH OR WITHOUT PRESENCE OF ULCERS (sore throat, sores in mouth, or a toothache) UNUSUAL RASH, SWELLING OR PAIN  UNUSUAL VAGINAL DISCHARGE OR ITCHING   Items with * indicate a potential emergency and should be followed up as soon as possible or go to the Emergency Department if any problems should occur.  Please show the CHEMOTHERAPY ALERT CARD or IMMUNOTHERAPY ALERT CARD at check-in to the Emergency Department and triage nurse.  Should you have questions after your visit or need to cancel or reschedule your appointment, please contact [facility]  [phone] and follow the prompts.  Office hours are 8:00 a.m. to 4:30 p.m. Monday - Friday. Please note that voicemails left after 4:00 p.m. may not be returned until the following business day.  We are closed weekends and major holidays. You have access to a nurse at all times for  urgent questions. Please call the main number to the clinic [phone] and follow the prompts.  For any non-urgent questions, you may also contact your provider using MyChart. We now offer e-Visits for anyone 85 and older to request care online for non-urgent symptoms. For details visit [email or web address].   Also download the MyChart app! Go to the app store, search "MyChart", open the app, select [facility], and log in with your MyChart username and password.  Due to Covid, a mask is required upon entering the hospital/clinic. If you do not have a mask, one will be given to you upon arrival. For doctor visits, patients may have 1 support person aged 30 or older with them. For treatment visits, patients cannot have anyone with them due to current Covid guidelines and our immunocompromised population.

## 2021-07-23 ENCOUNTER — Other Ambulatory Visit: Payer: Self-pay | Admitting: Internal Medicine

## 2021-08-08 ENCOUNTER — Other Ambulatory Visit: Payer: Self-pay | Admitting: Internal Medicine

## 2021-08-21 ENCOUNTER — Ambulatory Visit (INDEPENDENT_AMBULATORY_CARE_PROVIDER_SITE_OTHER): Payer: Medicare Other

## 2021-08-21 ENCOUNTER — Encounter (INDEPENDENT_AMBULATORY_CARE_PROVIDER_SITE_OTHER): Payer: Self-pay | Admitting: Vascular Surgery

## 2021-08-21 ENCOUNTER — Ambulatory Visit (INDEPENDENT_AMBULATORY_CARE_PROVIDER_SITE_OTHER): Payer: Medicare Other | Admitting: Vascular Surgery

## 2021-08-21 ENCOUNTER — Other Ambulatory Visit: Payer: Self-pay

## 2021-08-21 VITALS — BP 89/54 | HR 85 | Ht 59.0 in | Wt 136.0 lb

## 2021-08-21 DIAGNOSIS — I779 Disorder of arteries and arterioles, unspecified: Secondary | ICD-10-CM

## 2021-08-21 DIAGNOSIS — E1159 Type 2 diabetes mellitus with other circulatory complications: Secondary | ICD-10-CM

## 2021-08-21 DIAGNOSIS — I10 Essential (primary) hypertension: Secondary | ICD-10-CM

## 2021-08-21 DIAGNOSIS — I25119 Atherosclerotic heart disease of native coronary artery with unspecified angina pectoris: Secondary | ICD-10-CM

## 2021-08-21 DIAGNOSIS — E7800 Pure hypercholesterolemia, unspecified: Secondary | ICD-10-CM

## 2021-08-21 NOTE — Progress Notes (Signed)
MRN : [id number]  Jocelyn Elliott is a 87 y.o. (Apr 13, 1932) female who presents with chief complaint of check my carotid arteries.  History of Present Illness:   The patient is seen for follow up evaluation of carotid stenosis. The carotid stenosis followed by ultrasound.   She is s/p right CEA 01/09/2013 and Left CEA 10/08/2002.   The patient denies amaurosis fugax. There is no recent history of TIA symptoms or focal motor deficits. There is no prior documented CVA.   The patient is taking enteric-coated aspirin 81 mg daily.   There is no history of migraine headaches. There is no history of seizures.   The patient has a history of coronary artery disease, no recent episodes of angina or shortness of breath. The patient denies PAD or claudication symptoms. There is a history of hyperlipidemia which is being treated with a statin.     Carotid Duplex done today shows patent right CEA and 35-45% LICA restenosis.  No change compared to last study in 08/19/2020  Current Meds  Medication Sig   albuterol (VENTOLIN HFA) 108 (90 Base) MCG/ACT inhaler TAKE 2 PUFFS BY MOUTH EVERY 6 HOURS AS NEEDED FOR WHEEZE OR SHORTNESS OF BREATH   aspirin 81 MG tablet Take 81 mg by mouth daily.   Biotin 5000 MCG CAPS Take 1 capsule by mouth daily.   blood glucose meter kit and supplies KIT Dispense based on insurance preference. Use daily to check sugars once daily. Dx e11.9   cetirizine (ZYRTEC) 10 MG tablet Take 10 mg by mouth daily.   ferrous sulfate 325 (65 FE) MG EC tablet Take 325 mg by mouth daily with breakfast. Every other day   isosorbide mononitrate (IMDUR) 30 MG 24 hr tablet Take 30 mg by mouth daily.   Lancets (ONETOUCH DELICA PLUS [id number]) MISC USE AS INSTRUCTED TO CHECK BLOOD SUGARS ONCE DAILY. DX E 11.9   metFORMIN (GLUCOPHAGE) 500 MG tablet TAKE 1 TABLET BY MOUTH EVERY DAY WITH BREAKFAST   Omega-3 Fatty Acids (FISH OIL) 1200 MG CAPS Take 1 capsule by mouth daily.   ONETOUCH ULTRA test  strip USE AS INSTRUCTED TO CHECK BLOOD SUGARS ONCE DAILY. DX E11.9   rosuvastatin (CRESTOR) 40 MG tablet TAKE 1 TABLET BY MOUTH EVERY DAY   traZODone (DESYREL) 50 MG tablet TAKE 0.5-1 TABLETS (25-50 MG TOTAL) BY MOUTH AT BEDTIME AS NEEDED FOR SLEEP.    Past Medical History:  Diagnosis Date   Arthritis    AV malformation of gastrointestinal tract    Blood in stool    CAD (coronary artery disease)    Carotid arterial disease (HCC)    Cataracts, bilateral    Chronic blood loss anemia    Chronic cystitis    Complication of anesthesia    reaction to propofol   Depression    Diabetes mellitus ([place])    GERD (gastroesophageal reflux disease)    Hyperlipidemia    Hypertension    IDA (iron deficiency anemia)    Stress incontinence     Past Surgical History:  Procedure Laterality Date   ABDOMINAL HYSTERECTOMY  1972   ovaries left in place   APPENDECTOMY  1992   BACK SURGERY  06/08/2010   L3, L4, L5   CAROTID ARTERY ANGIOPLASTY  11/03   CARPAL TUNNEL RELEASE  1991, 92   right then left    CHOLECYSTECTOMY  1993   COLONOSCOPY WITH PROPOFOL N/A 02/02/2019   Procedure: COLONOSCOPY WITH PROPOFOL;  Surgeon: Manya Silvas, MD;  Location: [facility] ENDOSCOPY;  Service: Endoscopy;  Laterality: N/A;   ESOPHAGOGASTRODUODENOSCOPY N/A 02/02/2019   Procedure: ESOPHAGOGASTRODUODENOSCOPY (EGD);  Surgeon: Manya Silvas, MD;  Location: [facility] ENDOSCOPY;  Service: Endoscopy;  Laterality: N/A;   FOOT SURGERY  06/2007   Left   right carotid artery surgery  01/09/13   Dr. Delana Meyer @ [facility]   TOTAL HIP ARTHROPLASTY  05/03/2011   left 12, right 11/07    Social History Social History   Tobacco Use   Smoking status: Former    Types: Cigarettes    Quit date: 11/19/1989    Years since quitting: 31.7   Smokeless tobacco: Never  Substance Use Topics   Alcohol use: No    Alcohol/week: 0.0 standard drinks   Drug use: No    Family History Family History  Problem Relation Age of Onset   Hyperlipidemia  Father    Heart disease Father        myocardial infarction   Hypertension Father        Parent   Arthritis Other        parent   Diabetes Other        nephew   Cervical cancer Sister    Rectal cancer Sister    Breast cancer Neg Hx     Allergies  Allergen Reactions   Ciprofloxacin    Levaquin [Levofloxacin]    Propofol Other (See Comments)   Tequin [Gatifloxacin] Hives and Rash     REVIEW OF SYSTEMS (Negative unless checked)  Constitutional: [] Weight loss  [] Fever  [] Chills Cardiac: [] Chest pain   [] Chest pressure   [] Palpitations   [] Shortness of breath when laying flat   [] Shortness of breath with exertion. Vascular:  [] Pain in legs with walking   [] Pain in legs at rest  [] History of DVT   [] Phlebitis   [] Swelling in legs   [] Varicose veins   [] Non-healing ulcers Pulmonary:   [] Uses home oxygen   [] Productive cough   [] Hemoptysis   [] Wheeze  [] COPD   [] Asthma Neurologic:  [] Dizziness   [] Seizures   [] History of stroke   [] History of TIA  [] Aphasia   [] Vissual changes   [] Weakness or numbness in arm   [] Weakness or numbness in leg Musculoskeletal:   [] Joint swelling   [] Joint pain   [] Low back pain Hematologic:  [] Easy bruising  [] Easy bleeding   [] Hypercoagulable state   [] Anemic Gastrointestinal:  [] Diarrhea   [] Vomiting  [] Gastroesophageal reflux/heartburn   [] Difficulty swallowing. Genitourinary:  [] Chronic kidney disease   [] Difficult urination  [] Frequent urination   [] Blood in urine Skin:  [] Rashes   [] Ulcers  Psychological:  [] History of anxiety   []  History of major depression.  Physical Examination  Vitals:   08/21/21 1348  BP: (!) 89/54  Pulse: 85  Weight: 136 lb (61.7 kg)  Height: 4' 11"  (1.499 m)   Body mass index is 27.47 kg/m. Gen: WD/WN, NAD Head: /AT, No temporalis wasting.  Ear/Nose/Throat: Hearing grossly intact, nares w/o erythema or drainage Eyes: PER, EOMI, sclera nonicteric.  Neck: Supple, no masses.  No bruit or JVD.  Pulmonary:  Good  air movement, no audible wheezing, no use of accessory muscles.  Cardiac: RRR, normal S1, S2, no Murmurs. Vascular:    bilateral CEA scars well healed Vessel Right Left  Radial Palpable Palpable  Carotid Palpable Palpable  Gastrointestinal: soft, non-distended. No guarding/no peritoneal signs.  Musculoskeletal: M/S 5/5 throughout.  No visible deformity.  Neurologic: CN 2-12 intact. Pain and light touch intact in extremities.  Symmetrical.  Speech is fluent. Motor exam as listed above. Psychiatric: Judgment intact, Mood & affect appropriate for pt's clinical situation. Dermatologic: No rashes or ulcers noted.  No changes consistent with cellulitis.   CBC Lab Results  Component Value Date   WBC 7.0 06/26/2021   HGB 10.4 (L) 06/26/2021   HCT 31.2 (L) 06/26/2021   MCV 101.0 (H) 06/26/2021   PLT 153 06/26/2021    BMET    Component Value Date/Time   NA 138 06/26/2021 1242   NA 143 01/10/2013 0314   K 4.0 06/26/2021 1242   K 4.0 01/10/2013 0314   CL 104 06/26/2021 1242   CL 109 (H) 01/10/2013 0314   CO2 25 06/26/2021 1242   CO2 27 01/10/2013 0314   GLUCOSE 125 (H) 06/26/2021 1242   GLUCOSE 119 (H) 01/10/2013 0314   BUN 27 (H) 06/26/2021 1242   BUN 15 01/10/2013 0314   CREATININE 1.11 (H) 06/26/2021 1242   CREATININE 1.03 01/10/2013 0314   CREATININE 1.13 (H) 09/18/2012 0848   CALCIUM 9.1 06/26/2021 1242   CALCIUM 8.1 (L) 01/10/2013 0314   GFRNONAA 48 (L) 06/26/2021 1242   GFRNONAA 52 (L) 01/10/2013 0314   GFRNONAA 47 (L) 09/18/2012 0848   GFRAA 46 (L) 07/12/2020 1240   GFRAA >60 01/10/2013 0314   GFRAA 54 (L) 09/18/2012 0848   CrCl cannot be calculated (Patient's most recent lab result is older than the maximum 21 days allowed.).  COAG Lab Results  Component Value Date   INR 1.0 06/15/2019   INR 1.0 01/10/2013    Radiology No results found.   Assessment/Plan 1. Carotid artery disease, unspecified laterality, unspecified type ([place]) Recommend:   Given the  patient's asymptomatic subcritical stenosis no further invasive testing or surgery at this time.   Duplex ultrasound shows <50% stenosis bilaterally.   Continue antiplatelet therapy as prescribed Continue management of CAD, HTN and Hyperlipidemia Healthy heart diet,  encouraged exercise at least 4 times per week   Follow up in 12 months with duplex ultrasound and physical exam  - VAS US CAROTID; Future  2. Coronary artery disease involving native coronary artery of native heart with angina pectoris ([place]) Continue cardiac and antihypertensive medications as already ordered and reviewed, no changes at this time.   Continue statin as ordered and reviewed, no changes at this time   Nitrates PRN for chest pain  3. Primary hypertension Continue antihypertensive medications as already ordered, these medications have been reviewed and there are no changes at this time.   4. Type 2 diabetes mellitus with other circulatory complication, without long-term current use of insulin (HCC) Continue hypoglycemic medications as already ordered, these medications have been reviewed and there are no changes at this time.  Hgb A1C to be monitored as already arranged by primary service   5. Hypercholesteremia Continue statin as ordered and reviewed, no changes at this time    Hortencia Pilar, MD  08/21/2021 2:34 PM

## 2021-09-11 ENCOUNTER — Telehealth: Payer: Self-pay | Admitting: Internal Medicine

## 2021-09-12 ENCOUNTER — Telehealth: Payer: Self-pay | Admitting: Internal Medicine

## 2021-09-13 ENCOUNTER — Other Ambulatory Visit: Payer: Self-pay | Admitting: Internal Medicine

## 2021-09-13 NOTE — Telephone Encounter (Signed)
Forward to team

## 2021-09-15 ENCOUNTER — Other Ambulatory Visit: Payer: Self-pay

## 2021-09-15 ENCOUNTER — Ambulatory Visit (INDEPENDENT_AMBULATORY_CARE_PROVIDER_SITE_OTHER): Payer: Medicare Other | Admitting: Internal Medicine

## 2021-09-15 ENCOUNTER — Telehealth: Payer: Self-pay | Admitting: Internal Medicine

## 2021-09-15 DIAGNOSIS — R051 Acute cough: Secondary | ICD-10-CM

## 2021-09-15 DIAGNOSIS — E1159 Type 2 diabetes mellitus with other circulatory complications: Secondary | ICD-10-CM

## 2021-09-15 MED ORDER — ALBUTEROL SULFATE HFA 108 (90 BASE) MCG/ACT IN AERS: INHALATION_SPRAY | RESPIRATORY_TRACT | 1 refills | Status: DC

## 2021-09-15 MED ORDER — CEFDINIR 300 MG PO CAPS: 300.0000 mg | ORAL_CAPSULE | Freq: Two times a day (BID) | ORAL | 0 refills | Status: DC

## 2021-09-15 NOTE — Telephone Encounter (Signed)
Patient agreed to telephone at end of day with Dr Nicki Reaper;

## 2021-09-15 NOTE — Telephone Encounter (Signed)
See if she would be agreeable to do a virtual visit or telephone visit with me today.  Will add on to end of day.

## 2021-09-15 NOTE — Telephone Encounter (Signed)
Patient called and stated she does not have COVID, daughter did a home test. She has head congestion and wants Dr Nicki Reaper to call something in for her. Patient did not want to be triaged, she just had hip surgery. She says Dr Nicki Reaper always calls her in something for her head congestion.

## 2021-09-15 NOTE — Progress Notes (Signed)
Patient ID: Jocelyn Elliott, female   DOB: [birth year], 87 y.o.   MRN: [id number]   Virtual Visit via telephone Note  This visit type was conducted due to national recommendations for restrictions regarding the COVID-19 pandemic (e.g. social distancing).  This format is felt to be most appropriate for this patient at this time.  All issues noted in this document were discussed and addressed.  No physical exam was performed (except for noted visual exam findings with Video Visits).   I connected with Jocelyn Elliott by telephone and verified that I am speaking with the correct person using two identifiers. Location patient: home Location provider: work Persons participating in the virtual visit: patient, provider  The limitations, risks, security and privacy concerns of performing an evaluation and management service by telephone and the availability of in person appointments have been discussed. It has also been discussed with the patient that there may be a patient responsible charge related to this service. The patient expressed understanding and agreed to proceed.   Reason for visit: work in appt  HPI: Work in with concerns regarding increased cough and congestion.  States went to an outdoor birthday party 6 days ago.  Was cold.  Felt bad that night.  Noticed chills.  Later developed increased chest congestion and cough - productive of yellow mucus.  No chest pain or chest tightness.  No sbo.  She is drinking.  Trying to stay hydrated.  Some decreased appetite.  No vomiting.  Some nasal congestion and drainage.  Minimal sore throat.  No earache.  Some headache.  Taking tylenol.  Has not been around anyone sick.  No known covid exposure.     ROS: See pertinent positives and negatives per HPI.  Past Medical History:  Diagnosis Date   Arthritis    AV malformation of gastrointestinal tract    Blood in stool    CAD (coronary artery disease)    Carotid arterial disease (HCC)    Cataracts,  bilateral    Chronic blood loss anemia    Chronic cystitis    Complication of anesthesia    reaction to propofol   Depression    Diabetes mellitus (HCC)    GERD (gastroesophageal reflux disease)    Hyperlipidemia    Hypertension    IDA (iron deficiency anemia)    Stress incontinence     Past Surgical History:  Procedure Laterality Date   ABDOMINAL HYSTERECTOMY  1972   ovaries left in place   APPENDECTOMY  1992   BACK SURGERY  06/08/2010   L3, L4, L5   CAROTID ARTERY ANGIOPLASTY  11/03   CARPAL TUNNEL RELEASE  1991, 92   right then left    CHOLECYSTECTOMY  1993   COLONOSCOPY WITH PROPOFOL N/A 02/02/2019   Procedure: COLONOSCOPY WITH PROPOFOL;  Surgeon: Manya Silvas, MD;  Location: [facility] ENDOSCOPY;  Service: Endoscopy;  Laterality: N/A;   ESOPHAGOGASTRODUODENOSCOPY N/A 02/02/2019   Procedure: ESOPHAGOGASTRODUODENOSCOPY (EGD);  Surgeon: Manya Silvas, MD;  [facility] END[facility]COPY;  Service: Endoscopy;  Laterality: N/A;   FOOT SURGERY  06/2007   Left   right carotid artery surgery  01/09/13   Dr. Delana Meyer @ [facility]   TOTAL HIP ARTHROPLASTY  05/03/2011   left 12, right 11/07    Family History  Problem Relation Age of Onset   Hyperlipidemia Father    Heart disease Father        myocardial infarction   Hypertension Father        Parent  Arthritis Other        parent   Diabetes Other        nephew   Cervical cancer Sister    Rectal cancer Sister    Breast cancer Neg Hx     SOCIAL HX: reviewed.    Current Outpatient Medications:    aspirin 81 MG tablet, Take 81 mg by mouth daily., Disp: , Rfl:    Biotin 5000 MCG CAPS, Take 1 capsule by mouth daily., Disp: , Rfl:    blood glucose meter kit and supplies KIT, Dispense based on insurance preference. Use daily to check sugars once daily. Dx e11.9, Disp: 1 each, Rfl: 0   cefdinir (OMNICEF) 300 MG capsule, Take 1 capsule (300 mg total) by mouth 2 (two) times daily., Disp: 20 capsule, Rfl: 0   cetirizine (ZYRTEC) 10 MG  tablet, Take 10 mg by mouth daily., Disp: , Rfl:    ferrous sulfate 325 (65 FE) MG EC tablet, Take 325 mg by mouth daily with breakfast. Every other day, Disp: , Rfl:    isosorbide mononitrate (IMDUR) 30 MG 24 hr tablet, Take 30 mg by mouth daily., Disp: , Rfl:    Lancets (ONETOUCH DELICA PLUS [id number]) MISC, USE AS INSTRUCTED TO CHECK BLOOD SUGARS ONCE DAILY. DX E 11.9, Disp: 100 each, Rfl: 12   metFORMIN (GLUCOPHAGE) 500 MG tablet, TAKE 1 TABLET BY MOUTH EVERY DAY WITH BREAKFAST, Disp: 90 tablet, Rfl: 1   Omega-3 Fatty Acids (FISH OIL) 1200 MG CAPS, Take 1 capsule by mouth daily., Disp: , Rfl:    ONETOUCH ULTRA test strip, USE AS INSTRUCTED TO CHECK BLOOD SUGARS ONCE DAILY. DX E11.9, Disp: 100 strip, Rfl: 12   rosuvastatin (CRESTOR) 40 MG tablet, TAKE 1 TABLET BY MOUTH EVERY DAY, Disp: 90 tablet, Rfl: 1   traZODone (DESYREL) 50 MG tablet, TAKE 0.5-1 TABLETS (25-50 MG TOTAL) BY MOUTH AT BEDTIME AS NEEDED FOR SLEEP., Disp: 90 tablet, Rfl: 1   albuterol (VENTOLIN HFA) 108 (90 Base) MCG/ACT inhaler, TAKE 2 PUFFS BY MOUTH EVERY 6 HOURS AS NEEDED FOR WHEEZE OR SHORTNESS OF BREATH, Disp: 18 g, Rfl: 1  EXAM:  GENERAL: alert. Sounds to be in no acute distress.  Answering questions appropriately.   PSYCH/NEURO: pleasant and cooperative, no obvious depression or anxiety, speech and thought processing grossly intact  ASSESSMENT AND PLAN:  Discussed the following assessment and plan:  Problem List Items Addressed This Visit     Cough    Cough and congestion as outlined.  Appear to be c/w sinus infection/uri.  covid test negative.  No chest pain or sob.  Discussed staying hydrated.  Saline nasal spray and steroid nasal spray as directed.  mucinex DM.  Albuterol inhaler if needed.  omnicef as directed.  Probiotics.  Follow.  If any change or worsening symptoms, needs to be evaluated.  Call with update.        Diabetes mellitus ([place])    Sugars have been under reasonable control.  Follow.          No follow-ups on file.   I discussed the assessment and treatment plan with the patient. The patient was provided an opportunity to ask questions and all were answered. The patient agreed with the plan and demonstrated an understanding of the instructions.   The patient was advised to call back or seek an in-person evaluation if the symptoms worsen or if the condition fails to improve as anticipated.  I provided 22 minutes of non-face-to-face time during this  encounter.   Einar Pheasant, MD

## 2021-09-15 NOTE — Telephone Encounter (Signed)
Called patient for more info. Sx started 10/26. Daughter tested her for COVID today- negative. Symptoms are sinus pressure, chest congestion, productive cough- yellow mucus. Denies fever, chills, body aches, sob, chest tightness, etc. Using mucinex DM and delsym- not working. Patient stated that she knows this is sinus infection that could possibly be turning into bronchitis and this is common for her every year. Would like to have something called in. Advised that given her symptoms and this is the weekend- it is recommended that she be evaluated to determine best treatment. Patient requested to have message sent over to Dr Nicki Reaper. Also, confirmed with patient that she has not had any recent surgeries.

## 2021-09-16 ENCOUNTER — Encounter: Payer: Self-pay | Admitting: Internal Medicine

## 2021-09-16 NOTE — Assessment & Plan Note (Signed)
Cough and congestion as outlined.  Appear to be c/w sinus infection/uri.  covid test negative.  No chest pain or sob.  Discussed staying hydrated.  Saline nasal spray and steroid nasal spray as directed.  mucinex DM.  Albuterol inhaler if needed.  omnicef as directed.  Probiotics.  Follow.  If any change or worsening symptoms, needs to be evaluated.  Call with update.

## 2021-09-16 NOTE — Assessment & Plan Note (Signed)
Sugars have been under reasonable control.  Follow.

## 2021-09-28 ENCOUNTER — Ambulatory Visit: Payer: Medicare Other

## 2021-09-28 ENCOUNTER — Other Ambulatory Visit: Payer: Medicare Other

## 2021-09-28 ENCOUNTER — Ambulatory Visit: Payer: Medicare Other | Admitting: Internal Medicine

## 2021-10-08 ENCOUNTER — Telehealth: Payer: Self-pay | Admitting: Internal Medicine

## 2021-10-08 NOTE — Telephone Encounter (Signed)
Received notification that she is overdue mammogram.  If she wants a mammogram,please schedule.  If she declines, please note.

## 2021-10-09 ENCOUNTER — Inpatient Hospital Stay: Payer: Medicare Other | Attending: Internal Medicine

## 2021-10-09 ENCOUNTER — Inpatient Hospital Stay: Payer: Medicare Other

## 2021-10-09 ENCOUNTER — Encounter: Payer: Self-pay | Admitting: Internal Medicine

## 2021-10-09 ENCOUNTER — Inpatient Hospital Stay (HOSPITAL_BASED_OUTPATIENT_CLINIC_OR_DEPARTMENT_OTHER): Payer: Medicare Other | Admitting: Internal Medicine

## 2021-10-09 ENCOUNTER — Other Ambulatory Visit: Payer: Self-pay

## 2021-10-09 DIAGNOSIS — I129 Hypertensive chronic kidney disease with stage 1 through stage 4 chronic kidney disease, or unspecified chronic kidney disease: Secondary | ICD-10-CM | POA: Insufficient documentation

## 2021-10-09 DIAGNOSIS — N183 Chronic kidney disease, stage 3 unspecified: Secondary | ICD-10-CM | POA: Diagnosis present

## 2021-10-09 DIAGNOSIS — E1122 Type 2 diabetes mellitus with diabetic chronic kidney disease: Secondary | ICD-10-CM | POA: Insufficient documentation

## 2021-10-09 DIAGNOSIS — D631 Anemia in chronic kidney disease: Secondary | ICD-10-CM | POA: Insufficient documentation

## 2021-10-09 DIAGNOSIS — D508 Other iron deficiency anemias: Secondary | ICD-10-CM

## 2021-10-09 LAB — BASIC METABOLIC PANEL
Anion gap: 7 (ref 5–15)
BUN: 21 mg/dL (ref 8–23)
CO2: 21 mmol/L — ABNORMAL LOW (ref 22–32)
Calcium: 8.7 mg/dL — ABNORMAL LOW (ref 8.9–10.3)
Chloride: 108 mmol/L (ref 98–111)
Creatinine, Ser: 1.53 mg/dL — ABNORMAL HIGH (ref 0.44–1.00)
GFR, Estimated: 33 mL/min — ABNORMAL LOW (ref >60)
Glucose, Bld: 96 mg/dL (ref 70–99)
Potassium: 3.6 mmol/L (ref 3.5–5.1)
Sodium: 136 mmol/L (ref 135–145)

## 2021-10-09 LAB — CBC WITH DIFFERENTIAL/PLATELET
Abs Immature Granulocytes: 0.02 10*3/uL (ref 0.00–0.07)
Basophils Absolute: 0.1 10*3/uL (ref 0.0–0.1)
Basophils Relative: 1 %
Eosinophils Absolute: 0.2 10*3/uL (ref 0.0–0.5)
Eosinophils Relative: 3 %
HCT: 28.5 % — ABNORMAL LOW (ref 36.0–46.0)
Hemoglobin: 9 g/dL — ABNORMAL LOW (ref 12.0–15.0)
Immature Granulocytes: 0 %
Lymphocytes Relative: 20 %
Lymphs Abs: 1.4 10*3/uL (ref 0.7–4.0)
MCH: 32.1 pg (ref 26.0–34.0)
MCHC: 31.6 g/dL (ref 30.0–36.0)
MCV: 101.8 fL — ABNORMAL HIGH (ref 80.0–100.0)
Monocytes Absolute: 0.5 10*3/uL (ref 0.1–1.0)
Monocytes Relative: 6 %
Neutro Abs: 5 10*3/uL (ref 1.7–7.7)
Neutrophils Relative %: 70 %
Platelets: 177 10*3/uL (ref 150–400)
RBC: 2.8 MIL/uL — ABNORMAL LOW (ref 3.87–5.11)
RDW: 13.5 % (ref 11.5–15.5)
WBC: 7.1 10*3/uL (ref 4.0–10.5)
nRBC: 0 % (ref 0.0–0.2)

## 2021-10-09 MED ORDER — SODIUM CHLORIDE 0.9 % IV SOLN
Freq: Once | INTRAVENOUS | Status: AC
  Filled 2021-10-09: qty 250

## 2021-10-09 MED ORDER — SODIUM CHLORIDE 0.9 % IV SOLN
510.0000 mg | Freq: Once | INTRAVENOUS | Status: AC
  Administered 2021-10-09: 510 mg via INTRAVENOUS
  Filled 2021-10-09: qty 510

## 2021-10-09 NOTE — Patient Instructions (Signed)

## 2021-10-09 NOTE — Telephone Encounter (Signed)
Patient does want mammogram but also needs appt with Dr Nicki Reaper will call her back to get her set up. Patient had just woke up when I called.

## 2021-10-09 NOTE — Assessment & Plan Note (Addendum)
#  Severe anemia hemoglobin-nadir 6 [summer 2020]. Likely CKD [July 2021- Bone marrow biopsy-mild dyserythropoietic changes].   #Hemoglobin today is 9.0- IV Feraheme today;  Continue PO iron/Vitron-C every other day FEB 2022- Iron studies: Saturation29; ferritin 76; proceed with ferrahem today; and again in 1-2 weeks.   # CKD stage III-1.5- GFR- 33 STABLE;  Recommend continued p.o. intake.   # TIA: on plavix [Dr.Shah]on PT.    # DISPOSITION:  # Ferrahem today # 1 -2 week- Ferrahem.  # in 3 months- MD- labs- cbc/bmp;iron studies/ferritin-possible Ferrahem-/ Dr.B

## 2021-10-09 NOTE — Progress Notes (Signed)
[facility] OFFICE PROGRESS NOTE  Patient Care Team: Einar Pheasant, MD as PCP - General (Internal Medicine) Einar Pheasant, MD (Internal Medicine) Isaias Cowman, MD (Internal Medicine) Albesa Seen, MD (Unknown Physician Specialty) Brendolyn Patty, MD (Specialist) Schnier, Dolores Lory, MD (Vascular Surgery) Leia Alf, MD (Inactive) as Referring Physician (Internal Medicine) Isaias Cowman, MD (Internal Medicine) Albesa Seen, MD (Unknown Physician Specialty) Philis Kendall, MD (Unknown Physician Specialty) Brendolyn Patty, MD (Specialist) Schnier, Dolores Lory, MD (Vascular Surgery)   SUMMARY OF HEMATOLOGIC HISTORY:  # IRON DEFICIENCY ANEMIA- recurrent [? GI blood loss; Dr.Elliot; AVM-capsule]; IV Ferrahem q 19m last colo- 2012/march; EGD-- [id number]  July 2020 bone marrow biopsy-mild dysplastic changes; 40% hypercellularity; cytogenetics-normal.-Retacrit.  #Colonoscopy-April 2020 aborted because of cardiac pause.  # CKD stage III-IV; CAD-Dr. PSaralyn Pilar  March 30th, 2022-TIA/slurred spleech [Dr.Shah]  INTERVAL HISTORY: In a wheelchair.  Accompanied by her daughter RShirlean Mylar   87-year-old female patient with above history of recurrent iron deficiency anemia unclear etiology -related to CKD is here for follow-up.  In the interim patient was diagnosed with upper respiratory tract infection-end of October.  Complains of fatigue.  No blood in stools black or stools no nausea no vomiting.   Review of Systems  Constitutional:  Positive for malaise/fatigue. Negative for chills, diaphoresis, fever and weight loss.  HENT:  Negative for nosebleeds and sore throat.   Eyes:  Negative for double vision.  Respiratory:  Negative for cough, hemoptysis, sputum production and wheezing.   Cardiovascular:  Negative for palpitations, orthopnea and leg swelling.  Gastrointestinal:  Negative for abdominal pain, blood in stool, constipation, diarrhea, heartburn, melena, nausea  and vomiting.  Genitourinary:  Negative for dysuria, frequency and urgency.  Musculoskeletal:  Negative for back pain and joint pain.  Skin: Negative.  Negative for itching and rash.  Neurological:  Negative for dizziness, tingling, focal weakness, weakness and headaches.  Endo/Heme/Allergies:  Does not bruise/bleed easily.  Psychiatric/Behavioral:  Negative for depression. The patient is not nervous/anxious and does not have insomnia.     PAST MEDICAL HISTORY :  Past Medical History:  Diagnosis Date   Arthritis    AV malformation of gastrointestinal tract    Blood in stool    CAD (coronary artery disease)    Carotid arterial disease (HCC)    Cataracts, bilateral    Chronic blood loss anemia    Chronic cystitis    Complication of anesthesia    reaction to propofol   Depression    Diabetes mellitus (HCC)    GERD (gastroesophageal reflux disease)    Hyperlipidemia    Hypertension    IDA (iron deficiency anemia)    Stress incontinence     PAST SURGICAL HISTORY :   Past Surgical History:  Procedure Laterality Date   ABDOMINAL HYSTERECTOMY  1972   ovaries left in place   [place] 06/08[place]   L3, L4, L5   CAROTID ARTERY ANGIOPLASTY  11/03   CARPAL TUNNEL RELEASE  1991, 92   right then left    CHOLECYSTECTOMY  1993   COLONOSCOPY WITH PROPOFOL N/A 02/02/2019   Procedure: COLONOSCOPY WITH PROPOFOL;  Surgeon: EManya Silvas MD;  [facility];  Service: Endoscopy;  Laterality: N/A;   ESOPHAGOGASTRODUODENOSCOPY N/A 02/02/2019   Procedure: ESOPHAGOGASTRODUODENOSCOPY (EGD);  Surgeon: EManya Silvas MD;  [facility];  Service: Endoscopy;  Laterality: N/A;   FOOT SURGERY  06/2007   Left   right carotid artery surgery  01/09/13  Dr. Delana Meyer @ [facility]   TOTAL HIP ARTHROPLASTY  05/03/2011   left 12, right 11/07    FAMILY HISTORY :   Family History  Problem Relation Age of Onset   Hyperlipidemia Father    Heart disease  Father        myocardial infarction   Hypertension Father        Parent   Arthritis Other        parent   Diabetes Other        nephew   Cervical cancer Sister    Rectal cancer Sister    Breast cancer Neg Hx     SOCIAL HISTORY:   Social History   Tobacco Use   Smoking status: Former    Types: Cigarettes    Quit date: 11/19/1989    Years since quitting: 31.9   Smokeless tobacco: Never  Substance Use Topics   Alcohol use: No    Alcohol/week: 0.0 standard drinks   Drug use: No    ALLERGIES:  is allergic to ciprofloxacin, levaquin [levofloxacin], propofol, and tequin [gatifloxacin].  MEDICATIONS:  Current Outpatient Medications  Medication Sig Dispense Refill   albuterol (VENTOLIN HFA) 108 (90 Base) MCG/ACT inhaler TAKE 2 PUFFS BY MOUTH EVERY 6 HOURS AS NEEDED FOR WHEEZE OR SHORTNESS OF BREATH 18 g 1   aspirin 81 MG tablet Take 81 mg by mouth daily.     Biotin 5000 MCG CAPS Take 1 capsule by mouth daily.     blood glucose meter kit and supplies KIT Dispense based on insurance preference. Use daily to check sugars once daily. Dx e11.9 1 each 0   cetirizine (ZYRTEC) 10 MG tablet Take 10 mg by mouth daily.     docusate sodium (COLACE) 100 MG capsule Take 100 mg by mouth daily as needed for mild constipation.     ferrous sulfate 325 (65 FE) MG EC tablet Take 325 mg by mouth daily with breakfast. Every other day     isosorbide mononitrate (IMDUR) 30 MG 24 hr tablet Take 30 mg by mouth daily.     Lancets (ONETOUCH DELICA PLUS [id number]) MISC USE AS INSTRUCTED TO CHECK BLOOD SUGARS ONCE DAILY. DX E 11.9 100 each 12   metFORMIN (GLUCOPHAGE) 500 MG tablet TAKE 1 TABLET BY MOUTH EVERY DAY WITH BREAKFAST 90 tablet 1   Omega-3 Fatty Acids (FISH OIL) 1200 MG CAPS Take 1 capsule by mouth daily.     ONETOUCH ULTRA test strip USE AS INSTRUCTED TO CHECK BLOOD SUGARS ONCE DAILY. DX E11.9 100 strip 12   rosuvastatin (CRESTOR) 40 MG tablet TAKE 1 TABLET BY MOUTH EVERY DAY 90 tablet 1   traZODone  (DESYREL) 50 MG tablet TAKE 0.5-1 TABLETS (25-50 MG TOTAL) BY MOUTH AT BEDTIME AS NEEDED FOR SLEEP. 90 tablet 1   cefdinir (OMNICEF) 300 MG capsule Take 1 capsule (300 mg total) by mouth 2 (two) times daily. (Patient not taking: Reported on 10/09/2021) 20 capsule 0   No current facility-administered medications for this visit.    PHYSICAL EXAMINATION:   BP 111/66   Pulse 72   Temp 97.6 F (36.4 C)   Resp 16   Wt 133 lb 3.2 oz (60.4 kg)   BMI 26.90 kg/m   Filed Weights   10/09/21 1516  Weight: 133 lb 3.2 oz (60.4 kg)    Physical Exam Constitutional:      Comments: Frail-appearing Caucasian female patient.  She is in a wheelchair.  HENT:     Head: Normocephalic and  atraumatic.     Mouth/Throat:     Pharynx: No oropharyngeal exudate.  Eyes:     Pupils: Pupils are equal, round, and reactive to light.     Comments: Positive for pallor.  Cardiovascular:     Rate and Rhythm: Normal rate and regular rhythm.  Pulmonary:     Effort: No respiratory distress.     Breath sounds: No wheezing.  Abdominal:     General: Bowel sounds are normal. There is no distension.     Palpations: Abdomen is soft. There is no mass.     Tenderness: There is no abdominal tenderness. There is no guarding or rebound.  Musculoskeletal:        General: No tenderness. Normal range of motion.     Cervical back: Normal range of motion and neck supple.  Skin:    General: Skin is warm.  Neurological:     Mental Status: She is alert and oriented to person, place, and time.  Psychiatric:        Mood and Affect: Affect normal.    LABORATORY DATA:  I have reviewed the data as listed    Component Value Date/Time   NA 136 10/09/2021 1454   NA 143 01/10/2013 0314   K 3.6 10/09/2021 1454   K 4.0 01/10/2013 0314   CL 108 10/09/2021 1454   CL 109 (H) 01/10/2013 0314   CO2 21 (L) 10/09/2021 1454   CO2 27 01/10/2013 0314   GLUCOSE 96 10/09/2021 1454   GLUCOSE 119 (H) 01/10/2013 0314   BUN 21 10/09/2021  1454   BUN 15 01/10/2013 0314   CREATININE 1.53 (H) 10/09/2021 1454   CREATININE 1.03 01/10/2013 0314   CREATININE 1.13 (H) 09/18/2012 0848   CALCIUM 8.7 (L) 10/09/2021 1454   CALCIUM 8.1 (L) 01/10/2013 0314   PROT 6.3 02/07/2021 1121   PROT 7.2 12/20/2011 1521   ALBUMIN 4.0 02/07/2021 1121   ALBUMIN 3.9 12/20/2011 1521   AST 29 02/07/2021 1121   AST 31 12/20/2011 1521   ALT 29 02/07/2021 1121   ALT 37 12/20/2011 1521   ALKPHOS 55 02/07/2021 1121   ALKPHOS 57 12/20/2011 1521   BILITOT 0.4 02/07/2021 1121   BILITOT 0.5 12/20/2011 1521   GFRNONAA 33 (L) 10/09/2021 1454   GFRNONAA 52 (L) 01/10/2013 0314   GFRNONAA 47 (L) 09/18/2012 0848   GFRAA 46 (L) 07/12/2020 1240   GFRAA >60 01/10/2013 0314   GFRAA 54 (L) 09/18/2012 0848    No results found for: SPEP, UPEP  Lab Results  Component Value Date   WBC 7.1 10/09/2021   NEUTROABS 5.0 10/09/2021   HGB 9.0 (L) 10/09/2021   HCT 28.5 (L) 10/09/2021   MCV 101.8 (H) 10/09/2021   PLT 177 10/09/2021      Chemistry      Component Value Date/Time   NA 136 10/09/2021 1454   NA 143 01/10/2013 0314   K 3.6 10/09/2021 1454   K 4.0 01/10/2013 0314   CL 108 10/09/2021 1454   CL 109 (H) 01/10/2013 0314   CO2 21 (L) 10/09/2021 1454   CO2 27 01/10/2013 0314   BUN 21 10/09/2021 1454   BUN 15 01/10/2013 0314   CREATININE 1.53 (H) 10/09/2021 1454   CREATININE 1.03 01/10/2013 0314   CREATININE 1.13 (H) 09/18/2012 0848      Component Value Date/Time   CALCIUM 8.7 (L) 10/09/2021 1454   CALCIUM 8.1 (L) 01/10/2013 0314   ALKPHOS 55 02/07/2021 1121   ALKPHOS  57 12/20/2011 1521   AST 29 02/07/2021 1121   AST 31 12/20/2011 1521   ALT 29 02/07/2021 1121   ALT 37 12/20/2011 1521   BILITOT 0.4 02/07/2021 1121   BILITOT 0.5 12/20/2011 1521         ASSESSMENT & PLAN:   Anemia of chronic kidney failure, stage 3 (moderate) (HCC) #Severe anemia hemoglobin-nadir 6 [summer 2020]. Likely CKD [July 2021- Bone marrow biopsy-mild  dyserythropoietic changes].   #Hemoglobin today is 9.0- IV Feraheme today;  Continue PO iron/Vitron-C every other day FEB 2022- Iron studies: Saturation29; ferritin 76; proceed with ferrahem today; and again in 1-2 weeks.   # CKD stage III-1.5- GFR- 33 STABLE;  Recommend continued p.o. intake.   # TIA: on plavix [Dr.Shah]on PT.    # DISPOSITION:  # Ferrahem today # 1 -2 week- Ferrahem.  # in 3 months- MD- labs- cbc/bmp;iron studies/ferritin-possible Ferrahem-/ Dr.B     Cammie Sickle, MD 10/09/2021 4:31 PM

## 2021-10-09 NOTE — Progress Notes (Signed)
Patient denies new problems/concerns today.   °

## 2021-10-10 ENCOUNTER — Ambulatory Visit: Payer: Medicare Other | Admitting: Internal Medicine

## 2021-10-10 ENCOUNTER — Other Ambulatory Visit: Payer: Medicare Other

## 2021-10-10 ENCOUNTER — Ambulatory Visit: Payer: Medicare Other

## 2021-10-11 NOTE — Telephone Encounter (Signed)
Spoke with patient and patients daughter. They are going to hold off on scheduling appt with PCP and for mammogram until after the first of the year. She is currently going to Dr Aletha Halim office for decreased hgb and has family coming in for the holidays. Patient or daughter will call back if they need something prior to scheduling appt or if they need to schedule an earlier appt.

## 2021-10-19 ENCOUNTER — Ambulatory Visit: Payer: Medicare Other

## 2021-10-23 ENCOUNTER — Other Ambulatory Visit: Payer: Self-pay

## 2021-10-23 ENCOUNTER — Inpatient Hospital Stay: Payer: Medicare Other | Attending: Internal Medicine

## 2021-10-23 VITALS — BP 99/51 | HR 68 | Temp 96.2°F | Resp 16

## 2021-10-23 DIAGNOSIS — D508 Other iron deficiency anemias: Secondary | ICD-10-CM

## 2021-10-23 DIAGNOSIS — N183 Chronic kidney disease, stage 3 unspecified: Secondary | ICD-10-CM | POA: Diagnosis not present

## 2021-10-23 DIAGNOSIS — D631 Anemia in chronic kidney disease: Secondary | ICD-10-CM | POA: Insufficient documentation

## 2021-10-23 MED ORDER — FERUMOXYTOL INJECTION 510 MG/17 ML
510.0000 mg | Freq: Once | INTRAVENOUS | Status: DC
  Filled 2021-10-23: qty 17

## 2021-10-23 MED ORDER — SODIUM CHLORIDE 0.9 % IV SOLN
Freq: Once | INTRAVENOUS | Status: AC
  Filled 2021-10-23: qty 250

## 2021-10-23 MED ORDER — SODIUM CHLORIDE 0.9 % IV SOLN
510.0000 mg | Freq: Once | INTRAVENOUS | Status: AC
  Administered 2021-10-23: 510 mg via INTRAVENOUS
  Filled 2021-10-23: qty 510

## 2021-10-23 MED ORDER — SODIUM CHLORIDE 0.9 % IV SOLN: 510.0000 mg | Freq: Once | INTRAVENOUS | Status: DC

## 2021-11-07 ENCOUNTER — Telehealth: Payer: Self-pay | Admitting: Internal Medicine

## 2021-11-07 NOTE — Telephone Encounter (Signed)
Can work in tomorrow - ok to schedule virtual.  If any acute symptoms, will need to be seen earlier.

## 2021-11-07 NOTE — Telephone Encounter (Signed)
Patient called and stated that Dr Nicki Reaper needed to call in medication for her sinus. Patient was advised that she needed an appointment, she said not, Dr Nicki Reaper can call me in something, that she has no fever.Patient did not want an appointment or triaged.

## 2021-11-07 NOTE — Telephone Encounter (Signed)
Patient stated that she is having sinus drainage, cough, sore throat, runny nose. Denies sob, chest tightness, body aches, etc. Symptoms started over the weekend. Has not taken COVID test. Has taken a couple of days of mucinex and tylenol. Not helping. Did not really feel like she needed an appt but wants abx. Agreed to do telephone visit with PCP if she has to.

## 2021-11-08 ENCOUNTER — Ambulatory Visit (INDEPENDENT_AMBULATORY_CARE_PROVIDER_SITE_OTHER): Payer: Medicare Other | Admitting: Internal Medicine

## 2021-11-08 DIAGNOSIS — I10 Essential (primary) hypertension: Secondary | ICD-10-CM | POA: Diagnosis not present

## 2021-11-08 DIAGNOSIS — R0989 Other specified symptoms and signs involving the circulatory and respiratory systems: Secondary | ICD-10-CM | POA: Diagnosis not present

## 2021-11-08 DIAGNOSIS — R0981 Nasal congestion: Secondary | ICD-10-CM | POA: Diagnosis not present

## 2021-11-08 MED ORDER — CEFDINIR 300 MG PO CAPS: 300.0000 mg | ORAL_CAPSULE | Freq: Two times a day (BID) | ORAL | 0 refills | Status: DC

## 2021-11-08 NOTE — Progress Notes (Addendum)
Patient ID: Jocelyn Elliott, female   DOB: 16-Oct-1932, 87 y.o.   MRN: [id number]   Virtual Visit via telephone Note  This visit type was conducted due to national recommendations for restrictions regarding the COVID-19 pandemic (e.g. social distancing).  This format is felt to be most appropriate for this patient at this time.  All issues noted in this document were discussed and addressed.  No physical exam was performed (except for noted visual exam findings with Video Visits).   I connected with Jocelyn Elliott by telephone and verified that I am speaking with the correct person using two identifiers. Location patient: home Location provider: work  Persons participating in the telephone visit: patient, provider and pts daughter Jocelyn Elliott)  The limitations, risks, security and privacy concerns of performing an evaluation and management service by telephone and the availability of in person appointments have been discussed.  It has also been discussed with the patient that there may be a patient responsible charge related to this service. The patient expressed understanding and agreed to proceed.   Reason for visit: work in appt  HPI: Work in with concerns regarding increased congestion, drainage and some cough.  States symptoms started Sunday 11/05/21.  Noticed increased drainage - mostly right nostril initially.  Moved to left side yesterday.  Increased post nasal drainage.  Feels is moving into her chest.  Some coughing.  No chest pain, chest tightness or sob.  No sure throat.  Minimal headache.  No nausea or vomiting.  No diarrhea.  Taking mucines and delsym.     ROS: See pertinent positives and negatives per HPI.  Past Medical History:  Diagnosis Date   Arthritis    AV malformation of gastrointestinal tract    Blood in stool    CAD (coronary artery disease)    Carotid arterial disease (HCC)    Cataracts, bilateral    Chronic blood loss anemia    Chronic cystitis    Complication of  anesthesia    reaction to propofol   Depression    Diabetes mellitus (HCC)    GERD (gastroesophageal reflux disease)    Hyperlipidemia    Hypertension    IDA (iron deficiency anemia)    Stress incontinence     Past Surgical History:  Procedure Laterality Date   ABDOMINAL HYSTERECTOMY  1972   ovaries left in place   APPENDECTOMY  1992   BACK SURGERY  06/08/2010   L3, L4, L5   CAROTID ARTERY ANGIOPLASTY  11/03   CARPAL TUNNEL RELEASE  1991, 92   right then left    CHOLECYSTECTOMY  1993   COLONOSCOPY WITH PROPOFOL N/A 02/02/2019   Procedure: COLONOSCOPY WITH PROPOFOL;  Surgeon: Manya Silvas, MD;  Location: [facility] ENDOSCOPY;  Service: Endoscopy;  Laterality: N/A;   ESOPHAGOGASTRODUODENOSCOPY N/A 02/02/2019   Procedure: ESOPHAGOGASTRODUODENOSCOPY (EGD);  Surgeon: Manya Silvas, MD;  Location: [facility] ENDOSCOPY;  Service: Endoscopy;  Laterality: N/A;   FOOT SURGERY  06/2007   Left   right carotid artery surgery  01/09/13   Dr. Delana Meyer @ [facility]   TOTAL HIP ARTHROPLASTY  05/03/2011   left 12, right 11/07    Family History  Problem Relation Age of Onset   Hyperlipidemia Father    Heart disease Father        myocardial infarction   Hypertension Father        Parent   Arthritis Other        parent   Diabetes Other  nephew   Cervical cancer Sister    Rectal cancer Sister    Breast cancer Neg Hx     SOCIAL HX: reviewed.    Current Outpatient Medications:    albuterol (VENTOLIN HFA) 108 (90 Base) MCG/ACT inhaler, TAKE 2 PUFFS BY MOUTH EVERY 6 HOURS AS NEEDED FOR WHEEZE OR SHORTNESS OF BREATH, Disp: 18 g, Rfl: 1   aspirin 81 MG tablet, Take 81 mg by mouth daily., Disp: , Rfl:    Biotin 5000 MCG CAPS, Take 1 capsule by mouth daily., Disp: , Rfl:    blood glucose meter kit and supplies KIT, Dispense based on insurance preference. Use daily to check sugars once daily. Dx e11.9, Disp: 1 each, Rfl: 0   cefdinir (OMNICEF) 300 MG capsule, Take 1 capsule (300 mg total) by  mouth 2 (two) times daily., Disp: 20 capsule, Rfl: 0   cetirizine (ZYRTEC) 10 MG tablet, Take 10 mg by mouth daily., Disp: , Rfl:    docusate sodium (COLACE) 100 MG capsule, Take 100 mg by mouth daily as needed for mild constipation., Disp: , Rfl:    ferrous sulfate 325 (65 FE) MG EC tablet, Take 325 mg by mouth daily with breakfast. Every other day, Disp: , Rfl:    isosorbide mononitrate (IMDUR) 30 MG 24 hr tablet, Take 30 mg by mouth daily., Disp: , Rfl:    Lancets (ONETOUCH DELICA PLUS [id number]) MISC, USE AS INSTRUCTED TO CHECK BLOOD SUGARS ONCE DAILY. DX E 11.9, Disp: 100 each, Rfl: 12   metFORMIN (GLUCOPHAGE) 500 MG tablet, TAKE 1 TABLET BY MOUTH EVERY DAY WITH BREAKFAST, Disp: 90 tablet, Rfl: 1   Omega-3 Fatty Acids (FISH OIL) 1200 MG CAPS, Take 1 capsule by mouth daily., Disp: , Rfl:    ONETOUCH ULTRA test strip, USE AS INSTRUCTED TO CHECK BLOOD SUGARS ONCE DAILY. DX E11.9, Disp: 100 strip, Rfl: 12   rosuvastatin (CRESTOR) 40 MG tablet, TAKE 1 TABLET BY MOUTH EVERY DAY, Disp: 90 tablet, Rfl: 1   traZODone (DESYREL) 50 MG tablet, TAKE 0.5-1 TABLETS (25-50 MG TOTAL) BY MOUTH AT BEDTIME AS NEEDED FOR SLEEP., Disp: 90 tablet, Rfl: 1  EXAM:  VITALS per patient if applicable: 003/49, 179/15 pulse 70s  GENERAL: alert.  Sounds to be in no acute distress.  Answering questions appropriately.    PSYCH/NEURO: pleasant and cooperative, no obvious depression or anxiety, speech and thought processing grossly intact  ASSESSMENT AND PLAN:  Discussed the following assessment and plan:  Problem List Items Addressed This Visit     Congestion of nasal sinus    Increased congestion, post nasal drainage and minimal cough.  No sob.  No chest pain or chest tightness.  Continue mucinex and delsym.  Discussed saline nasal spray and steroid nasal spray.  omnicef as directed.  Discussed continuing probiotics.  Follow.  Call with update.        Hypertension    Continues on imdur.  Discussed need to follow  pressures.         Return if symptoms worsen or fail to improve.   I discussed the assessment and treatment plan with the patient. The patient was provided an opportunity to ask questions and all were answered. The patient agreed with the plan and demonstrated an understanding of the instructions.   The patient was advised to call back or seek an in-person evaluation if the symptoms worsen or if the condition fails to improve as anticipated.  I provided 23 minutes of non-face-to-face time during this encounter.  Einar Pheasant, MD

## 2021-11-08 NOTE — Telephone Encounter (Signed)
Patient scheduled with Dr Scott.  

## 2021-11-09 ENCOUNTER — Encounter: Payer: Self-pay | Admitting: Internal Medicine

## 2021-11-09 DIAGNOSIS — R0981 Nasal congestion: Secondary | ICD-10-CM | POA: Insufficient documentation

## 2021-11-09 NOTE — Assessment & Plan Note (Signed)
Increased congestion, post nasal drainage and minimal cough.  No sob.  No chest pain or chest tightness.  Continue mucinex and delsym.  Discussed saline nasal spray and steroid nasal spray.  omnicef as directed.  Discussed continuing probiotics.  Follow.  Call with update.

## 2021-11-09 NOTE — Assessment & Plan Note (Signed)
Continues on imdur.  Discussed need to follow pressures.

## 2021-11-09 NOTE — Addendum Note (Signed)
Addended by: Alisa Graff on: 11/09/2021 03:10 AM   Modules accepted: Level of Service

## 2021-11-17 NOTE — Telephone Encounter (Signed)
Access Nurse called in regards to pt. Pt declined ED and UC. Pt was advised to be seen within 1-3 days

## 2021-11-17 NOTE — Telephone Encounter (Signed)
Agree with need for evaluation if persistent symptoms despite abx.  Agree with need for cxr and evaluation.

## 2021-11-17 NOTE — Telephone Encounter (Signed)
Pt needs appt here schedule or rec [facility] urgent care or [facility] ED or [place] ED  No abx without evaluation

## 2021-11-17 NOTE — Addendum Note (Signed)
Addended by: Orland Mustard on: 11/17/2021 01:11 PM   Modules accepted: Orders

## 2021-11-17 NOTE — Telephone Encounter (Signed)
Pt called in regards to prev symptoms she was experiencing before. Pt finished her antibiotics however symptoms have returned and pt is thinking she needs another round of antibiotics. Pt stated her chest has been feeling tight as well and she feels like its now in her lungs.  PCP is out of office pt was transferred to access nurse.

## 2021-11-17 NOTE — Telephone Encounter (Signed)
FYI doc of day-   Patient called stating that she may need another round of abx. Patient stated that she is still congested in her chest and coughing. No fever, chills, sob, etc. Patient stated that she is not feeling bad. Advisd that Dr Nicki Reaper is not in the office and that she needs to be re-evaluated. Pt declined. She said she is going to continue to monitor her symptoms at home and if worsens she will go to [facility] but right now overall she is feeling pretty good. Patient is going to update me next week and let me know if she needs anything.

## 2021-11-17 NOTE — Telephone Encounter (Signed)
Patient declined evaluation. Offered cxr at [facility] per discussion with Dr Kelly [facility]. Patient declined cxr as well.

## 2021-11-17 NOTE — Telephone Encounter (Signed)
Patient declined both urgent care and cxr.

## 2021-12-02 ENCOUNTER — Other Ambulatory Visit: Payer: Self-pay | Admitting: Internal Medicine

## 2021-12-24 ENCOUNTER — Other Ambulatory Visit: Payer: Self-pay | Admitting: Internal Medicine

## 2022-01-02 ENCOUNTER — Other Ambulatory Visit: Payer: Self-pay | Admitting: *Deleted

## 2022-01-02 DIAGNOSIS — D508 Other iron deficiency anemias: Secondary | ICD-10-CM

## 2022-01-02 DIAGNOSIS — N183 Chronic kidney disease, stage 3 unspecified: Secondary | ICD-10-CM

## 2022-01-03 LAB — HM DIABETES EYE EXAM

## 2022-01-08 ENCOUNTER — Telehealth: Payer: Self-pay

## 2022-01-08 ENCOUNTER — Inpatient Hospital Stay: Payer: Medicare Other

## 2022-01-08 ENCOUNTER — Inpatient Hospital Stay (HOSPITAL_BASED_OUTPATIENT_CLINIC_OR_DEPARTMENT_OTHER): Payer: Medicare Other | Admitting: Internal Medicine

## 2022-01-08 ENCOUNTER — Encounter: Payer: Self-pay | Admitting: Internal Medicine

## 2022-01-08 ENCOUNTER — Other Ambulatory Visit: Payer: Self-pay

## 2022-01-08 ENCOUNTER — Inpatient Hospital Stay: Payer: Medicare Other | Attending: Internal Medicine

## 2022-01-08 DIAGNOSIS — D508 Other iron deficiency anemias: Secondary | ICD-10-CM

## 2022-01-08 DIAGNOSIS — D631 Anemia in chronic kidney disease: Secondary | ICD-10-CM | POA: Insufficient documentation

## 2022-01-08 DIAGNOSIS — N183 Chronic kidney disease, stage 3 unspecified: Secondary | ICD-10-CM

## 2022-01-08 LAB — CBC WITH DIFFERENTIAL/PLATELET
Abs Immature Granulocytes: 0.02 10*3/uL (ref 0.00–0.07)
Basophils Absolute: 0.1 10*3/uL (ref 0.0–0.1)
Basophils Relative: 1 %
Eosinophils Absolute: 0.4 10*3/uL (ref 0.0–0.5)
Eosinophils Relative: 5 %
HCT: 23.2 % — ABNORMAL LOW (ref 36.0–46.0)
Hemoglobin: 7.2 g/dL — ABNORMAL LOW (ref 12.0–15.0)
Immature Granulocytes: 0 %
Lymphocytes Relative: 20 %
Lymphs Abs: 1.6 10*3/uL (ref 0.7–4.0)
MCH: 34.6 pg — ABNORMAL HIGH (ref 26.0–34.0)
MCHC: 31 g/dL (ref 30.0–36.0)
MCV: 111.5 fL — ABNORMAL HIGH (ref 80.0–100.0)
Monocytes Absolute: 0.4 10*3/uL (ref 0.1–1.0)
Monocytes Relative: 5 %
Neutro Abs: 5.4 10*3/uL (ref 1.7–7.7)
Neutrophils Relative %: 69 %
Platelets: 216 10*3/uL (ref 150–400)
RBC: 2.08 MIL/uL — ABNORMAL LOW (ref 3.87–5.11)
RDW: 15.7 % — ABNORMAL HIGH (ref 11.5–15.5)
WBC: 7.9 10*3/uL (ref 4.0–10.5)
nRBC: 0 % (ref 0.0–0.2)

## 2022-01-08 LAB — IRON AND TIBC
Iron: 62 ug/dL (ref 28–170)
Saturation Ratios: 14 % (ref 10.4–31.8)
TIBC: 441 ug/dL (ref 250–450)
UIBC: 379 ug/dL

## 2022-01-08 LAB — FERRITIN: Ferritin: 30 ng/mL (ref 11–307)

## 2022-01-08 LAB — BASIC METABOLIC PANEL
Anion gap: 8 (ref 5–15)
BUN: 23 mg/dL (ref 8–23)
CO2: 21 mmol/L — ABNORMAL LOW (ref 22–32)
Calcium: 8.9 mg/dL (ref 8.9–10.3)
Chloride: 106 mmol/L (ref 98–111)
Creatinine, Ser: 1.26 mg/dL — ABNORMAL HIGH (ref 0.44–1.00)
GFR, Estimated: 41 mL/min — ABNORMAL LOW (ref >60)
Glucose, Bld: 80 mg/dL (ref 70–99)
Potassium: 4 mmol/L (ref 3.5–5.1)
Sodium: 135 mmol/L (ref 135–145)

## 2022-01-08 LAB — SAMPLE TO BLOOD BANK

## 2022-01-08 MED ORDER — SODIUM CHLORIDE 0.9 % IV SOLN
510.0000 mg | Freq: Once | INTRAVENOUS | Status: AC
  Administered 2022-01-08: 510 mg via INTRAVENOUS
  Filled 2022-01-08: qty 510

## 2022-01-08 MED ORDER — SODIUM CHLORIDE 0.9 % IV SOLN
Freq: Once | INTRAVENOUS | Status: AC
  Filled 2022-01-08: qty 250

## 2022-01-08 NOTE — Telephone Encounter (Signed)
[facility] request submitted online. Order form printed and faxed with demo and ins card.

## 2022-01-08 NOTE — Progress Notes (Signed)
From time to time pt states she can feel some pain in chest/heart. States she has a murmer.

## 2022-01-08 NOTE — Assessment & Plan Note (Addendum)
#  Severe anemia hemoglobin-nadir 6 [summer 2020]. Likely CKD/iron deficiency [July 2021- Bone marrow biopsy-mild dyserythropoietic changes]. Last ferrahem infusion- nov & Dec 2022-however Hemoglobin today is 7.2; - IV Feraheme today;  Continue PO iron/Vitron-C every other day. B12- 2020- 202. Will repeat labs at next visit/ possible b12.   #Discussed the significant drop in hemoglobin symptomatic.  MCV elevated-question MDS.  Check foundation 1 heme.  Also proceed with 1 unit of PRBC transfusion.  # CKD stage III-1.5- GFR- 40s STABLE;  Recommend continued p.o. intake.   # TIA: on plavix [Dr.Shah]on PT.  # cataract surgery-recommend reaching out to ophthalmology given the anemia/candidacy for surgery in the next 2 weeks.  # DISPOSITION:  # Ferrahem today # lab today- [facility] Hem; HOLD tube; # 1 unit PRBC in next 1-2 days # in 3 weeks- MD- labs-cbc;B12;  HOLD tube;-possible 1 unit of PRBC or Ferrahem-/ Dr.B

## 2022-01-08 NOTE — Telephone Encounter (Signed)
[facility] Heme (kit ID [id number]) order #[id number]  [employer] tracking [phone]

## 2022-01-08 NOTE — Progress Notes (Signed)
[place] OFFICE PROGRESS NOTE  Patient Care Team: Einar Pheasant, MD as PCP - General (Internal Medicine) Einar Pheasant, MD (Internal Medicine) Isaias Cowman, MD (Internal Medicine) Albesa Seen, MD (Unknown Physician Specialty) Brendolyn Patty, MD (Specialist) Schnier, Dolores Lory, MD (Vascular Surgery) Leia Alf, MD (Inactive) as Referring Physician (Internal Medicine) Isaias Cowman, MD (Internal Medicine) Albesa Seen, MD (Unknown Physician Specialty) Philis Kendall, MD (Unknown Physician Specialty) Brendolyn Patty, MD (Specialist) Schnier, Dolores Lory, MD (Vascular Surgery)   SUMMARY OF HEMATOLOGIC HISTORY:  # IRON DEFICIENCY ANEMIA- recurrent [? GI blood loss; Dr.Elliot; AVM-capsule]; IV Ferrahem q 87m last colo- 2012/march; EGD-- [id number]  July 2020 bone marrow biopsy-mild dysplastic changes; 40% hypercellularity; cytogenetics-normal.-Retacrit.  #Colonoscopy-April 2020 aborted because of cardiac pause.  # CKD stage III-IV; CAD-Dr. PSaralyn Pilar  March 30th, 2022-TIA/slurred spleech [Dr.Shah]  INTERVAL HISTORY: In a wheelchair.  Accompanied by her daughter RShirlean Mylar   88-year-old female patient with above history of recurrent iron deficiency anemia unclear etiology -related to CKD is here for follow-up.  Patient last received iron infusion in November December 2022.  Patient complains of worsening fatigue.  Feels cold.  Denies any blood in stools or black or stools. no nausea no vomiting.   Review of Systems  Constitutional:  Positive for malaise/fatigue. Negative for chills, diaphoresis, fever and weight loss.  HENT:  Negative for nosebleeds and sore throat.   Eyes:  Negative for double vision.  Respiratory:  Negative for cough, hemoptysis, sputum production and wheezing.   Cardiovascular:  Negative for palpitations, orthopnea and leg swelling.  Gastrointestinal:  Negative for abdominal pain, blood in stool, constipation, diarrhea,  heartburn, melena, nausea and vomiting.  Genitourinary:  Negative for dysuria, frequency and urgency.  Musculoskeletal:  Negative for back pain and joint pain.  Skin: Negative.  Negative for itching and rash.  Neurological:  Negative for dizziness, tingling, focal weakness, weakness and headaches.  Endo/Heme/Allergies:  Does not bruise/bleed easily.  Psychiatric/Behavioral:  Negative for depression. The patient is not nervous/anxious and does not have insomnia.     PAST MEDICAL HISTORY :  Past Medical History:  Diagnosis Date   Arthritis    AV malformation of gastrointestinal tract    Blood in stool    CAD (coronary artery disease)    Carotid arterial disease (HCC)    Cataracts, bilateral    Chronic blood loss anemia    Chronic cystitis    Complication of anesthesia    reaction to propofol   Depression    Diabetes mellitus (HCC)    GERD (gastroesophageal reflux disease)    Hyperlipidemia    Hypertension    IDA (iron deficiency anemia)    Stress incontinence     PAST SURGICAL HISTORY :   Past Surgical History:  Procedure Laterality Date   ABDOMINAL HYSTERECTOMY  1972   ovaries left in place   [place]   L3, L4, L5   CAROTID ARTERY ANGIOPLASTY  11/03   CARPAL TUNNEL RELEASE  1991, 92   right then left    CHOLECYSTECTOMY  1993   COLONOSCOPY WITH PROPOFOL N/A 02/02/2019   Procedure: COLONOSCOPY WITH PROPOFOL;  Surgeon: EManya Silvas MD;  [facility]  Service: Endoscopy;  Laterality: N/A;   ESOPHAGOGASTRODUODENOSCOPY N/A 02/02/2019   Procedure: ESOPHAGOGASTRODUODENOSCOPY (EGD);  Surgeon: EManya Silvas MD;  [facility];  Service: Endoscopy;  Laterality: N/A;   FOOT SURGERY  06/2007   Left   right carotid artery surgery  01/09/13   Dr. Delana Meyer @ [facility]   TOTAL HIP ARTHROPLASTY  05/03/2011   left 12, right 11/07    FAMILY HISTORY :   Family History  Problem Relation Age of Onset   Hyperlipidemia  Father    Heart disease Father        myocardial infarction   Hypertension Father        Parent   Arthritis Other        parent   Diabetes Other        nephew   Cervical cancer Sister    Rectal cancer Sister    Breast cancer Neg Hx     SOCIAL HISTORY:   Social History   Tobacco Use   Smoking status: Former    Types: Cigarettes    Quit date: 11/19/1989    Years since quitting: 32.1   Smokeless tobacco: Never  Substance Use Topics   Alcohol use: No    Alcohol/week: 0.0 standard drinks   Drug use: No    ALLERGIES:  is allergic to ciprofloxacin, levaquin [levofloxacin], propofol, and tequin [gatifloxacin].  MEDICATIONS:  Current Outpatient Medications  Medication Sig Dispense Refill   albuterol (VENTOLIN HFA) 108 (90 Base) MCG/ACT inhaler TAKE 2 PUFFS BY MOUTH EVERY 6 HOURS AS NEEDED FOR WHEEZE OR SHORTNESS OF BREATH 18 g 1   aspirin 81 MG tablet Take 81 mg by mouth daily.     Biotin 5000 MCG CAPS Take 1 capsule by mouth daily.     blood glucose meter kit and supplies KIT Dispense based on insurance preference. Use daily to check sugars once daily. Dx e11.9 1 each 0   cetirizine (ZYRTEC) 10 MG tablet Take 10 mg by mouth daily.     docusate sodium (COLACE) 100 MG capsule Take 100 mg by mouth daily as needed for mild constipation.     ferrous sulfate 325 (65 FE) MG EC tablet Take 325 mg by mouth daily with breakfast. Every other day     isosorbide mononitrate (IMDUR) 30 MG 24 hr tablet Take 30 mg by mouth daily.     Lancets (ONETOUCH DELICA PLUS [id number]) MISC USE AS INSTRUCTED TO CHECK BLOOD SUGARS ONCE DAILY. DX E 11.9 100 each 12   metFORMIN (GLUCOPHAGE) 500 MG tablet TAKE 1 TABLET BY MOUTH EVERY DAY WITH BREAKFAST 90 tablet 1   Omega-3 Fatty Acids (FISH OIL) 1200 MG CAPS Take 1 capsule by mouth daily.     ONETOUCH ULTRA test strip USE AS INSTRUCTED TO CHECK BLOOD SUGARS ONCE DAILY. DX E11.9 100 strip 12   Probiotic, Lactobacillus, CAPS      rosuvastatin (CRESTOR) 40 MG  tablet TAKE 1 TABLET BY MOUTH EVERY DAY 90 tablet 1   traZODone (DESYREL) 50 MG tablet TAKE 0.5-1 TABLETS BY MOUTH AT BEDTIME AS NEEDED FOR SLEEP. 90 tablet 1   No current facility-administered medications for this visit.    PHYSICAL EXAMINATION:   BP (!) 111/52 (BP Location: Left Arm, Patient Position: Sitting, Cuff Size: Normal)    Pulse 76    Temp 97.7 F (36.5 C) (Tympanic)    Ht 4' 11"  (1.499 m)    Wt 131 lb 3.2 oz (59.5 kg)    SpO2 100%    BMI 26.50 kg/m   Filed Weights   01/08/22 1321  Weight: 131 lb 3.2 oz (59.5 kg)    Physical Exam Constitutional:      Comments: [place] Caucasian female patient.  She is in a wheelchair.  HENT:  Head: Normocephalic and atraumatic.     Mouth/Throat:     Pharynx: No oropharyngeal exudate.  Eyes:     Pupils: Pupils are equal, round, and reactive to light.     Comments: Positive for pallor.  Cardiovascular:     Rate and Rhythm: Normal rate and regular rhythm.  Pulmonary:     Effort: No respiratory distress.     Breath sounds: No wheezing.  Abdominal:     General: Bowel sounds are normal. There is no distension.     Palpations: Abdomen is soft. There is no mass.     Tenderness: There is no abdominal tenderness. There is no guarding or rebound.  Musculoskeletal:        General: No tenderness. Normal range of motion.     Cervical back: Normal range of motion and neck supple.  Skin:    General: Skin is warm.  Neurological:     Mental Status: She is alert and oriented to person, place, and time.  Psychiatric:        Mood and Affect: Affect normal.    LABORATORY DATA:  I have reviewed the data as listed    Component Value Date/Time   NA 135 01/08/2022 1303   NA 143 01/10/2013 0314   K 4.0 01/08/2022 1303   K 4.0 01/10/2013 0314   CL 106 01/08/2022 1303   CL 109 (H) 01/10/2013 0314   CO2 21 (L) 01/08/2022 1303   CO2 27 01/10/2013 0314   GLUCOSE 80 01/08/2022 1303   GLUCOSE 119 (H) 01/10/2013 0314   BUN 23  01/08/2022 1303   BUN 15 01/10/2013 0314   CREATININE 1.26 (H) 01/08/2022 1303   CREATININE 1.03 01/10/2013 0314   CREATININE 1.13 (H) 09/18/2012 0848   CALCIUM 8.9 01/08/2022 1303   CALCIUM 8.1 (L) 01/10/2013 0314   PROT 6.3 02/07/2021 1121   PROT 7.2 12/20/2011 1521   ALBUMIN 4.0 02/07/2021 1121   ALBUMIN 3.9 12/20/2011 1521   AST 29 02/07/2021 1121   AST 31 12/20/2011 1521   ALT 29 02/07/2021 1121   ALT 37 12/20/2011 1521   ALKPHOS 55 02/07/2021 1121   ALKPHOS 57 12/20/2011 1521   BILITOT 0.4 02/07/2021 1121   BILITOT 0.5 12/20/2011 1521   GFRNONAA 41 (L) 01/08/2022 1303   GFRNONAA 52 (L) 01/10/2013 0314   GFRNONAA 47 (L) 09/18/2012 0848   GFRAA 46 (L) 07/12/2020 1240   GFRAA >60 01/10/2013 0314   GFRAA 54 (L) 09/18/2012 0848    No results found for: SPEP, UPEP  Lab Results  Component Value Date   WBC 7.9 01/08/2022   NEUTROABS 5.4 01/08/2022   HGB 7.2 (L) 01/08/2022   HCT 23.2 (L) 01/08/2022   MCV 111.5 (H) 01/08/2022   PLT 216 01/08/2022      Chemistry      Component Value Date/Time   NA 135 01/08/2022 1303   NA 143 01/10/2013 0314   K 4.0 01/08/2022 1303   K 4.0 01/10/2013 0314   CL 106 01/08/2022 1303   CL 109 (H) 01/10/2013 0314   CO2 21 (L) 01/08/2022 1303   CO2 27 01/10/2013 0314   BUN 23 01/08/2022 1303   BUN 15 01/10/2013 0314   CREATININE 1.26 (H) 01/08/2022 1303   CREATININE 1.03 01/10/2013 0314   CREATININE 1.13 (H) 09/18/2012 0848      Component Value Date/Time   CALCIUM 8.9 01/08/2022 1303   CALCIUM 8.1 (L) 01/10/2013 0314   ALKPHOS 55 02/07/2021 1121  ALKPHOS 57 12/20/2011 1521   AST 29 02/07/2021 1121   AST 31 12/20/2011 1521   ALT 29 02/07/2021 1121   ALT 37 12/20/2011 1521   BILITOT 0.4 02/07/2021 1121   BILITOT 0.5 12/20/2011 1521         ASSESSMENT & PLAN:   Anemia of chronic kidney failure, stage 3 (moderate) (HCC) #Severe anemia hemoglobin-nadir 6 [summer 2020]. Likely CKD/iron deficiency [July 2021- Bone marrow  biopsy-mild dyserythropoietic changes]. Last ferrahem infusion- nov & Dec 2022-however Hemoglobin today is 7.2; - IV Feraheme today;  Continue PO iron/Vitron-C every other day. B12- 2020- 202. Will repeat labs at next visit/ possible b12.   #Discussed the significant drop in hemoglobin symptomatic.  MCV elevated-question MDS.  Check foundation 1 heme.  Also proceed with 1 unit of PRBC transfusion.  # CKD stage III-1.5- GFR- 40s STABLE;  Recommend continued p.o. intake.   # TIA: on plavix [Dr.Shah]on PT.  # cataract surgery-recommend reaching out to ophthalmology given the anemia/candidacy for surgery in the next 2 weeks.  # DISPOSITION:  # Ferrahem today # lab today- [facility] Hem; HOLD tube; # 1 unit PRBC in next 1-2 days # in 3 weeks- MD- labs-cbc;B12;  HOLD tube;-possible 1 unit of PRBC or Ferrahem-/ Dr.B      Cammie Sickle, MD 01/08/2022 4:56 PM

## 2022-01-09 LAB — PREPARE RBC (CROSSMATCH)

## 2022-01-10 ENCOUNTER — Inpatient Hospital Stay: Payer: Medicare Other

## 2022-01-10 ENCOUNTER — Other Ambulatory Visit: Payer: Self-pay

## 2022-01-10 DIAGNOSIS — N183 Chronic kidney disease, stage 3 unspecified: Secondary | ICD-10-CM | POA: Diagnosis not present

## 2022-01-10 DIAGNOSIS — D631 Anemia in chronic kidney disease: Secondary | ICD-10-CM

## 2022-01-10 MED ORDER — SODIUM CHLORIDE 0.9% IV SOLUTION
250.0000 mL | Freq: Once | INTRAVENOUS | Status: AC
  Administered 2022-01-10: 250 mL via INTRAVENOUS
  Filled 2022-01-10: qty 250

## 2022-01-10 MED ORDER — ACETAMINOPHEN 325 MG PO TABS
650.0000 mg | ORAL_TABLET | Freq: Once | ORAL | Status: AC
  Administered 2022-01-10: 650 mg via ORAL
  Filled 2022-01-10: qty 2

## 2022-01-11 LAB — TYPE AND SCREEN
ABO/RH(D): A POS
Antibody Screen: POSITIVE
Donor AG Type: NEGATIVE
Unit division: 0

## 2022-01-11 LAB — BPAM RBC
Blood Product Expiration Date: 202303122359
ISSUE DATE / TIME: 202302220945
Unit Type and Rh: 6200

## 2022-01-24 ENCOUNTER — Encounter: Payer: Self-pay | Admitting: Internal Medicine

## 2022-01-24 NOTE — Telephone Encounter (Signed)
Results in, placed in Dr. Sharmaine Base folder for review. ?

## 2022-01-29 ENCOUNTER — Ambulatory Visit: Payer: Medicare Other | Admitting: Dermatology

## 2022-01-30 ENCOUNTER — Inpatient Hospital Stay: Payer: Medicare Other | Attending: Internal Medicine

## 2022-01-30 ENCOUNTER — Inpatient Hospital Stay: Payer: Medicare Other

## 2022-01-30 ENCOUNTER — Other Ambulatory Visit: Payer: Self-pay

## 2022-01-30 ENCOUNTER — Inpatient Hospital Stay (HOSPITAL_BASED_OUTPATIENT_CLINIC_OR_DEPARTMENT_OTHER): Payer: Medicare Other | Admitting: Internal Medicine

## 2022-01-30 ENCOUNTER — Encounter: Payer: Self-pay | Admitting: Internal Medicine

## 2022-01-30 DIAGNOSIS — D631 Anemia in chronic kidney disease: Secondary | ICD-10-CM

## 2022-01-30 DIAGNOSIS — N183 Chronic kidney disease, stage 3 unspecified: Secondary | ICD-10-CM

## 2022-01-30 DIAGNOSIS — D508 Other iron deficiency anemias: Secondary | ICD-10-CM

## 2022-01-30 LAB — CBC WITH DIFFERENTIAL/PLATELET
Abs Immature Granulocytes: 0.03 10*3/uL (ref 0.00–0.07)
Basophils Absolute: 0.1 10*3/uL (ref 0.0–0.1)
Basophils Relative: 1 %
Eosinophils Absolute: 0.3 10*3/uL (ref 0.0–0.5)
Eosinophils Relative: 4 %
HCT: 31.5 % — ABNORMAL LOW (ref 36.0–46.0)
Hemoglobin: 10 g/dL — ABNORMAL LOW (ref 12.0–15.0)
Immature Granulocytes: 1 %
Lymphocytes Relative: 18 %
Lymphs Abs: 1.1 10*3/uL (ref 0.7–4.0)
MCH: 33.9 pg (ref 26.0–34.0)
MCHC: 31.7 g/dL (ref 30.0–36.0)
MCV: 106.8 fL — ABNORMAL HIGH (ref 80.0–100.0)
Monocytes Absolute: 0.4 10*3/uL (ref 0.1–1.0)
Monocytes Relative: 7 %
Neutro Abs: 4.3 10*3/uL (ref 1.7–7.7)
Neutrophils Relative %: 69 %
Platelets: 142 10*3/uL — ABNORMAL LOW (ref 150–400)
RBC: 2.95 MIL/uL — ABNORMAL LOW (ref 3.87–5.11)
RDW: 14.7 % (ref 11.5–15.5)
WBC: 6.2 10*3/uL (ref 4.0–10.5)
nRBC: 0 % (ref 0.0–0.2)

## 2022-01-30 LAB — LACTATE DEHYDROGENASE: LDH: 181 U/L (ref 98–192)

## 2022-01-30 LAB — SAMPLE TO BLOOD BANK

## 2022-01-30 LAB — VITAMIN B12: Vitamin B-12: 244 pg/mL (ref 180–914)

## 2022-01-30 MED ORDER — SODIUM CHLORIDE 0.9 % IV SOLN
510.0000 mg | Freq: Once | INTRAVENOUS | Status: AC
  Administered 2022-01-30: 510 mg via INTRAVENOUS
  Filled 2022-01-30: qty 510

## 2022-01-30 MED ORDER — SODIUM CHLORIDE 0.9 % IV SOLN
Freq: Once | INTRAVENOUS | Status: AC
  Filled 2022-01-30: qty 250

## 2022-01-30 NOTE — Progress Notes (Signed)
[place] ?OFFICE PROGRESS NOTE ? ?Patient Care Team: ?Einar Pheasant, MD as PCP - General (Internal Medicine) ?Einar Pheasant, MD (Internal Medicine) ?Isaias Cowman, MD (Internal Medicine) ?Brendolyn Patty, MD (Specialist) ?Schnier, Dolores Lory, MD (Vascular Surgery) ?Isaias Cowman, MD (Internal Medicine) ?Brendolyn Patty, MD (Specialist) ?Schnier, Dolores Lory, MD (Vascular Surgery) ? ? ?SUMMARY OF HEMATOLOGIC HISTORY: ? ?# IRON DEFICIENCY ANEMIA- recurrent [? GI blood loss; Dr.Elliot; AVM-capsule]; IV Ferrahem q 72m last colo- 2012/march; EGD-- [id number]  July 2020 bone marrow biopsy-mild dysplastic changes; 40% hypercellularity; cytogenetics-normal.-Retacrit. ? ?#Colonoscopy-April 2020 aborted because of cardiac pause. ? ?# CKD stage III-IV; CAD-Dr. PSaralyn Pilar  March 30th, 2022-TIA/slurred spleech [Dr.Shah] ? ?INTERVAL HISTORY: In a wheelchair.  Accompanied by her daughter RShirlean Mylar ? ? 88-year-old female patient with above history of recurrent iron deficiency anemia unclear etiology -related to CKD is here for follow-up. ? ?Patient received 1 unit of PRBC transfusion approximately 2 weeks ago.  Also received IV iron/Feraheme. ? ?Patient admits to improvement of her fatigue .  Denies any blood in stools or black or stools. no nausea no vomiting.  Patient is awaiting to have eye surgery. ? ?Review of Systems  ?Constitutional:  Positive for malaise/fatigue. Negative for chills, diaphoresis, fever and weight loss.  ?HENT:  Negative for nosebleeds and sore throat.   ?Eyes:  Negative for double vision.  ?Respiratory:  Negative for cough, hemoptysis, sputum production and wheezing.   ?Cardiovascular:  Negative for palpitations, orthopnea and leg swelling.  ?Gastrointestinal:  Negative for abdominal pain, blood in stool, constipation, diarrhea, heartburn, melena, nausea and vomiting.  ?Genitourinary:  Negative for dysuria, frequency and urgency.  ?Musculoskeletal:  Negative for back pain and joint  pain.  ?Skin: Negative.  Negative for itching and rash.  ?Neurological:  Negative for dizziness, tingling, focal weakness, weakness and headaches.  ?Endo/Heme/Allergies:  Does not bruise/bleed easily.  ?Psychiatric/Behavioral:  Negative for depression. The patient is not nervous/anxious and does not have insomnia.   ? ? ?PAST MEDICAL HISTORY :  ?Past Medical History:  ?Diagnosis Date  ? Arthritis   ? AV malformation of gastrointestinal tract   ? Blood in stool   ? CAD (coronary artery disease)   ? Carotid arterial disease ([place]   ? Cataracts, bilateral   ? Chronic blood loss anemia   ? Chronic cystitis   ? Complication of anesthesia   ? reaction to propofol  ? Depression   ? Diabetes mellitus ([place]   ? GERD (gastroesophageal reflux disease)   ? Hyperlipidemia   ? Hypertension   ? IDA (iron deficiency anemia)   ? Stress incontinence   ? ? ?PAST SURGICAL HISTORY :   ?Past Surgical History:  ?Procedure Laterality Date  ? ABDOMINAL HYSTERECTOMY  1972  ? ovaries left in place  ? APPENDECTOMY  1992  ? BACK SURGERY  06/08/2010  ? L3, L4, L5  ? CAR[place]ID [place]  [place]/03  [place]  ? right then left   ? CHOLECYSTECTOMY  1993  ? COLONOSCOPY WITH PROPOFOL N/A 02/02/2019  ? Procedure: COLONOSCOPY WITH PROPOFOL;  Surgeon: EManya Silvas MD;  [facility];  Service: Endoscopy;  Laterality: N/A;  ? ESOPHAGOGASTRODUODENOSCOPY N/A 02/02/2019  ? Procedure: ESOPHAGOGASTRODUODENOSCOPY (EGD);  Surgeon: EManya Silvas MD;  [facility];  Service: Endoscopy;  Laterality: N/A;  ? FOOT SURGERY  06/2007  ? Left  ? right carotid artery surgery  01/09/13  ? Dr. SDelana Meyer@ [facility]  ? TOTAL HIP ARTHROPLASTY  05/03/2011  ? left 12, right 11/07  ? ? ?FAMILY HISTORY :   ?Family History  ?Problem Relation Age of Onset  ? Hyperlipidemia Father   ? Heart disease Father   ?     myocardial infarction  ? Hypertension Father   ?     Parent  ? Arthritis Other   ?     parent  ? Diabetes Other   ?      nephew  ? Cervical cancer Sister   ? Rectal cancer Sister   ? Breast cancer Neg Hx   ? ? ?SOCIAL HISTORY:   ?Social History  ? ?Tobacco Use  ? Smoking status: Former  ?  Types: Cigarettes  ?  Quit date: 11/19/1989  ?  Years since quitting: 32.2  ? Smokeless tobacco: Never  ?Vaping Use  ? Vaping Use: Never used  ?Substance Use Topics  ? Alcohol use: No  ?  Alcohol/week: 0.0 standard drinks  ? Drug use: No  ? ? ?ALLERGIES:  is allergic to ciprofloxacin, levaquin [levofloxacin], propofol, and tequin [gatifloxacin]. ? ?MEDICATIONS:  ?Current Outpatient Medications  ?Medication Sig Dispense Refill  ? albuterol (VENTOLIN HFA) 108 (90 Base) MCG/ACT inhaler TAKE 2 PUFFS BY MOUTH EVERY 6 HOURS AS NEEDED FOR WHEEZE OR SHORTNESS OF BREATH 18 g 1  ? aspirin 81 MG tablet Take 81 mg by mouth daily.    ? Biotin 5000 MCG CAPS Take 1 capsule by mouth daily.    ? blood glucose meter kit and supplies KIT Dispense based on insurance preference. Use daily to check sugars once daily. Dx e11.9 1 each 0  ? cetirizine (ZYRTEC) 10 MG tablet Take 10 mg by mouth daily.    ? docusate sodium (COLACE) 100 MG capsule Take 100 mg by mouth daily as needed for mild constipation.    ? ferrous sulfate 325 (65 FE) MG EC tablet Take 325 mg by mouth daily with breakfast. Every other day    ? isosorbide mononitrate (IMDUR) 30 MG 24 hr tablet Take 30 mg by mouth daily.    ? Lancets (ONETOUCH DELICA PLUS LANCET30G) MISC USE AS INSTRUCTED TO CHECK BLOOD SUGARS ONCE DAILY. DX E 11.9 100 each 12  ? metFORMIN (GLUCOPHAGE) 500 MG tablet TAKE 1 TABLET BY MOUTH EVERY DAY WITH BREAKFAST 90 tablet 1  ? Omega-3 Fatty Acids (FISH OIL) 1200 MG CAPS Take 1 capsule by mouth daily.    ? ONETOUCH ULTRA test strip USE AS INSTRUCTED TO CHECK BLOOD SUGARS ONCE DAILY. DX E11.9 100 strip 12  ? Probiotic, Lactobacillus, CAPS     ? rosuvastatin (CRESTOR) 40 MG tablet TAKE 1 TABLET BY MOUTH EVERY DAY 90 tablet 1  ? traZODone (DESYREL) 50 MG tablet TAKE 0.5-1 TABLETS BY MOUTH AT  BEDTIME AS NEEDED FOR SLEEP. 90 tablet 1  ? ?No current facility-administered medications for this visit.  ? ? ?PHYSICAL EXAMINATION: ? ? ?BP (!) 97/52 (BP Location: Left Arm, Patient Position: Sitting, Cuff Size: Normal)   Pulse 78   Temp (!) 97.4 ?F (36.3 ?C) (Tympanic)   Ht 4' 11" (1.499 m)   Wt 133 lb 6.4 oz (60.5 kg)   SpO2 99%   BMI 26.94 kg/m?  ? ?Filed Weights  ? 01/30/22 0904  ?Weight: 133 lb 6.4 oz (60.5 kg)  ? ? ?Physical Exam ?Constitutional:   ?   Comments: Frail-appearing Caucasian female patient.  She is in a wheelchair.  ?HENT:  ?   Head: Normocephalic and atraumatic.  ?     Mouth/Throat:  ?   Pharynx: No oropharyngeal exudate.  ?Eyes:  ?   Pupils: Pupils are equal, round, and reactive to light.  ?   Comments: Positive for pallor.  ?Cardiovascular:  ?   Rate and Rhythm: Normal rate and regular rhythm.  ?Pulmonary:  ?   Effort: No respiratory distress.  ?   Breath sounds: No wheezing.  ?Abdominal:  ?   General: Bowel sounds are normal. There is no distension.  ?   Palpations: Abdomen is soft. There is no mass.  ?   Tenderness: There is no abdominal tenderness. There is no guarding or rebound.  ?Musculoskeletal:     ?   General: No tenderness. Normal range of motion.  ?   Cervical back: Normal range of motion and neck supple.  ?Skin: ?   General: Skin is warm.  ?Neurological:  ?   Mental Status: She is alert and oriented to person, place, and time.  ?Psychiatric:     ?   Mood and Affect: Affect normal.  ? ? ?LABORATORY DATA:  ?I have reviewed the data as listed ?   ?Component Value Date/Time  ? NA 135 01/08/2022 1303  ? NA 143 01/10/2013 0314  ? K 4.0 01/08/2022 1303  ? K 4.0 01/10/2013 0314  ? CL 106 01/08/2022 1303  ? CL 109 (H) 01/10/2013 0314  ? CO2 21 (L) 01/08/2022 1303  ? CO2 27 01/10/2013 0314  ? GLUCOSE 80 01/08/2022 1303  ? GLUCOSE 119 (H) 01/10/2013 0314  ? BUN 23 01/08/2022 1303  ? BUN 15 01/10/2013 0314  ? CREATININE 1.26 (H) 01/08/2022 1303  ? CREATININE 1.03 01/10/2013 0314  ?  CREATININE 1.13 (H) 09/18/2012 0848  ? CALCIUM 8.9 01/08/2022 1303  ? CALCIUM 8.1 (L) 01/10/2013 0314  ? PROT 6.3 02/07/2021 1121  ? PROT 7.2 12/20/2011 1521  ? ALBUMIN 4.0 02/07/2021 1121  ? ALBUMIN 3.9 12/20/2011 15

## 2022-01-30 NOTE — Progress Notes (Signed)
Pt states she is going to have cataract surgery 02/13/2022. ?

## 2022-01-30 NOTE — Assessment & Plan Note (Addendum)
#  Severe anemia hemoglobin-nadir 6 [summer 2020]. Likely CKD/iron deficiency [July 2021- Bone marrow biopsy-mild dyserythropoietic changes]. MARCH 2023- Foundation One Hem pending.  ? ?#Given the recent drop in hemoglobin to 7.2  [Feb 2023-] s/p PRBC transfusion; Feraheme.  Awaiting foundation 1.awaiting on B12 levels from today.  Also consider Retacrit.  If B12 low would recommend B12 injections. ? ?# CKD stage III-1.5- GFR- 40s STABLE;  Recommend continued p.o. intake.;  See above ? ?# TIA: on plavix [Dr.Shah] on PT. ? ?# cataract surgery-pt cleared from hematology standpoint.  ? ?# DISPOSITION:  ?# Ferrahem today; NO Blood ?# in 4 weeks- MD- labs-cbc;bmp; HOLD tube;-possible 1 unit of PRBC or Ferrahem; possible B12 injection- Dr.B ? ? ? ?

## 2022-02-01 ENCOUNTER — Telehealth: Payer: Self-pay | Admitting: Internal Medicine

## 2022-02-01 NOTE — Telephone Encounter (Signed)
I spoke to patient's daughter Shirlean Mylar regarding slightly low B12; recommend B12 injection.  ? ?Please schedule B12 injection next week; otherwise follow-up as planned. ? ?Thanks ?GB ? ?

## 2022-02-02 NOTE — Telephone Encounter (Signed)
Done pt is aware ?

## 2022-02-05 ENCOUNTER — Inpatient Hospital Stay: Payer: Medicare Other

## 2022-02-05 ENCOUNTER — Other Ambulatory Visit: Payer: Self-pay

## 2022-02-05 DIAGNOSIS — N183 Chronic kidney disease, stage 3 unspecified: Secondary | ICD-10-CM | POA: Diagnosis not present

## 2022-02-05 DIAGNOSIS — D649 Anemia, unspecified: Secondary | ICD-10-CM

## 2022-02-05 MED ORDER — CYANOCOBALAMIN 1000 MCG/ML IJ SOLN
1000.0000 ug | Freq: Once | INTRAMUSCULAR | Status: AC
  Administered 2022-02-05: 1000 ug via INTRAMUSCULAR
  Filled 2022-02-05: qty 1

## 2022-02-06 ENCOUNTER — Encounter: Payer: Self-pay | Admitting: Ophthalmology

## 2022-02-08 NOTE — Discharge Instructions (Signed)

## 2022-02-13 ENCOUNTER — Encounter: Payer: Self-pay | Admitting: Ophthalmology

## 2022-02-13 ENCOUNTER — Ambulatory Visit: Payer: Medicare Other | Admitting: Anesthesiology

## 2022-02-13 ENCOUNTER — Other Ambulatory Visit: Payer: Self-pay

## 2022-02-13 ENCOUNTER — Encounter
Admission: RE | Disposition: A | Discharge status: Home / Self Care | Payer: Self-pay | Source: Home / Self Care | Attending: Ophthalmology

## 2022-02-13 ENCOUNTER — Ambulatory Visit
Admission: RE | Admit: 2022-02-13 | Discharge: 2022-02-13 | Disposition: A | Discharge status: Home / Self Care | Payer: Medicare Other | Attending: Ophthalmology | Admitting: Ophthalmology

## 2022-02-13 DIAGNOSIS — I2510 Atherosclerotic heart disease of native coronary artery without angina pectoris: Secondary | ICD-10-CM | POA: Insufficient documentation

## 2022-02-13 DIAGNOSIS — Z8249 Family history of ischemic heart disease and other diseases of the circulatory system: Secondary | ICD-10-CM | POA: Diagnosis not present

## 2022-02-13 DIAGNOSIS — Z833 Family history of diabetes mellitus: Secondary | ICD-10-CM | POA: Insufficient documentation

## 2022-02-13 DIAGNOSIS — H2512 Age-related nuclear cataract, left eye: Secondary | ICD-10-CM | POA: Diagnosis not present

## 2022-02-13 DIAGNOSIS — Z87891 Personal history of nicotine dependence: Secondary | ICD-10-CM | POA: Diagnosis not present

## 2022-02-13 DIAGNOSIS — I129 Hypertensive chronic kidney disease with stage 1 through stage 4 chronic kidney disease, or unspecified chronic kidney disease: Secondary | ICD-10-CM | POA: Diagnosis not present

## 2022-02-13 DIAGNOSIS — K219 Gastro-esophageal reflux disease without esophagitis: Secondary | ICD-10-CM | POA: Diagnosis not present

## 2022-02-13 DIAGNOSIS — N183 Chronic kidney disease, stage 3 unspecified: Secondary | ICD-10-CM | POA: Insufficient documentation

## 2022-02-13 DIAGNOSIS — Z7984 Long term (current) use of oral hypoglycemic drugs: Secondary | ICD-10-CM | POA: Diagnosis not present

## 2022-02-13 DIAGNOSIS — E1122 Type 2 diabetes mellitus with diabetic chronic kidney disease: Secondary | ICD-10-CM | POA: Diagnosis not present

## 2022-02-13 DIAGNOSIS — D649 Anemia, unspecified: Secondary | ICD-10-CM | POA: Insufficient documentation

## 2022-02-13 DIAGNOSIS — Z8673 Personal history of transient ischemic attack (TIA), and cerebral infarction without residual deficits: Secondary | ICD-10-CM | POA: Insufficient documentation

## 2022-02-13 DIAGNOSIS — F32A Depression, unspecified: Secondary | ICD-10-CM | POA: Diagnosis not present

## 2022-02-13 DIAGNOSIS — E1136 Type 2 diabetes mellitus with diabetic cataract: Secondary | ICD-10-CM | POA: Diagnosis present

## 2022-02-13 HISTORY — DX: Chronic kidney disease, stage 3 unspecified: N18.30

## 2022-02-13 HISTORY — DX: Presence of dental prosthetic device (complete) (partial): Z97.2

## 2022-02-13 HISTORY — PX: CATARACT EXTRACTION W/PHACO: SHX586

## 2022-02-13 LAB — GLUCOSE, CAPILLARY
Glucose-Capillary: 89 mg/dL (ref 70–99)
Glucose-Capillary: 107 mg/dL — ABNORMAL HIGH (ref 70–99)

## 2022-02-13 SURGERY — PHACOEMULSIFICATION, CATARACT, WITH IOL INSERTION
Anesthesia: Monitor Anesthesia Care | Site: Eye | Laterality: Left

## 2022-02-13 MED ORDER — CEFUROXIME OPHTHALMIC INJECTION 1 MG/0.1 ML
INJECTION | OPHTHALMIC | Status: DC | PRN
  Administered 2022-02-13: 0.1 mL via INTRACAMERAL

## 2022-02-13 MED ORDER — ACETAMINOPHEN 160 MG/5ML PO SOLN: 325.0000 mg | ORAL | Status: DC | PRN

## 2022-02-13 MED ORDER — FENTANYL CITRATE (PF) 100 MCG/2ML IJ SOLN
INTRAMUSCULAR | Status: DC | PRN
  Administered 2022-02-13: 50 ug via INTRAVENOUS

## 2022-02-13 MED ORDER — LACTATED RINGERS IV SOLN: INTRAVENOUS | Status: DC

## 2022-02-13 MED ORDER — SIGHTPATH DOSE#1 BSS IO SOLN
INTRAOCULAR | Status: DC | PRN
  Administered 2022-02-13: 69 mL via OPHTHALMIC

## 2022-02-13 MED ORDER — TETRACAINE HCL 0.5 % OP SOLN
1.0000 [drp] | OPHTHALMIC | Status: DC | PRN
  Administered 2022-02-13 (×3): 1 [drp] via OPHTHALMIC

## 2022-02-13 MED ORDER — SIGHTPATH DOSE#1 BSS IO SOLN
INTRAOCULAR | Status: DC | PRN
  Administered 2022-02-13: 15 mL

## 2022-02-13 MED ORDER — MIDAZOLAM HCL 2 MG/2ML IJ SOLN
INTRAMUSCULAR | Status: DC | PRN
Start: 2022-02-13 — End: 2022-02-13
  Administered 2022-02-13: .5 mg via INTRAVENOUS

## 2022-02-13 MED ORDER — ARMC OPHTHALMIC DILATING DROPS
1.0000 "application " | OPHTHALMIC | Status: DC | PRN
  Administered 2022-02-13 (×3): 1 via OPHTHALMIC

## 2022-02-13 MED ORDER — SIGHTPATH DOSE#1 NA CHONDROIT SULF-NA HYALURON 40-17 MG/ML IO SOLN
INTRAOCULAR | Status: DC | PRN
  Administered 2022-02-13: 1 mL via INTRAOCULAR

## 2022-02-13 MED ORDER — ACETAMINOPHEN 325 MG PO TABS: 325.0000 mg | ORAL_TABLET | ORAL | Status: DC | PRN

## 2022-02-13 MED ORDER — SIGHTPATH DOSE#1 BSS IO SOLN
INTRAOCULAR | Status: DC | PRN
  Administered 2022-02-13: 1 mL

## 2022-02-13 MED ORDER — BRIMONIDINE TARTRATE-TIMOLOL 0.2-0.5 % OP SOLN
OPHTHALMIC | Status: DC | PRN
  Administered 2022-02-13: 1 [drp] via OPHTHALMIC

## 2022-02-13 SURGICAL SUPPLY — 10 items
CATARACT SUITE SIGHTPATH (MISCELLANEOUS) ×2 IMPLANT
FEE CATARACT SUITE SIGHTPATH (MISCELLANEOUS) ×1 IMPLANT
GLOVE SURG ENC TEXT LTX SZ8 (GLOVE) ×2 IMPLANT
GLOVE SURG TRIUMPH 8.0 PF LTX (GLOVE) ×2 IMPLANT
LENS IOL TECNIS EYHANCE 19.5 (Intraocular Lens) ×1 IMPLANT
NDL FILTER BLUNT 18X1 1/2 (NEEDLE) ×1 IMPLANT
NEEDLE FILTER BLUNT 18X 1/2SAF (NEEDLE) ×1
NEEDLE FILTER BLUNT 18X1 1/2 (NEEDLE) ×1 IMPLANT
SYR 3ML LL SCALE MARK (SYRINGE) ×2 IMPLANT
WATER STERILE IRR 250ML POUR (IV SOLUTION) ×2 IMPLANT

## 2022-02-13 NOTE — Anesthesia Preprocedure Evaluation (Signed)
Anesthesia Evaluation  ?Patient identified by MRN, date of birth, ID band ?Patient awake ? ? ? ?Reviewed: ?Allergy & Precautions, H&P , NPO status , Patient's Chart, lab work & pertinent test results, reviewed documented beta blocker date and time  ? ?Airway ?Mallampati: II ? ?TM Distance: >3 FB ?Neck ROM: full ? ? ? Dental ? ?(+) Upper Dentures, Lower Dentures ?  ?Pulmonary ?former smoker,  ?  ?Pulmonary exam normal ?breath sounds clear to auscultation ? ? ? ? ? ? Cardiovascular ?Exercise Tolerance: Good ?hypertension, + CAD  ?Normal cardiovascular exam ?Rhythm:regular Rate:Normal ? ? ?  ?Neuro/Psych ?PSYCHIATRIC DISORDERS Anxiety Depression TIA  ? GI/Hepatic ?Neg liver ROS, GERD  ,  ?Endo/Other  ?negative endocrine ROSdiabetes ? Renal/GU ?negative Renal ROS  ?negative genitourinary ?  ?Musculoskeletal ? ? Abdominal ?  ?Peds ? Hematology ? ?(+) Blood dyscrasia, anemia ,   ?Anesthesia Other Findings ? ? Reproductive/Obstetrics ?negative OB ROS ? ?  ? ? ? ? ? ? ? ? ? ? ? ? ? ?  ?  ? ? ? ? ? ? ? ? ?Anesthesia Physical ?Anesthesia Plan ? ?ASA: 3 ? ?Anesthesia Plan: MAC  ? ?Post-op Pain Management:   ? ?Induction:  ? ?PONV Risk Score and Plan:  ? ?Airway Management Planned:  ? ?Additional Equipment:  ? ?Intra-op Plan:  ? ?Post-operative Plan:  ? ?Informed Consent: I have reviewed the patients History and Physical, chart, labs and discussed the procedure including the risks, benefits and alternatives for the proposed anesthesia with the patient or authorized representative who has indicated his/her understanding and acceptance.  ? ? ? ?Dental Advisory Given ? ?Plan Discussed with: CRNA ? ?Anesthesia Plan Comments:   ? ? ? ? ? ? ?Anesthesia Quick Evaluation ? ?

## 2022-02-13 NOTE — Op Note (Signed)
PREOPERATIVE DIAGNOSIS:  Nuclear sclerotic cataract of the left eye. ?  ?POSTOPERATIVE DIAGNOSIS:  Nuclear sclerotic cataract of the left eye. ?  ?OPERATIVE PROCEDURE:ORPROCALL@ ?  ?SURGEON:  Birder Robson, MD. ?  ?ANESTHESIA: ? ?No anesthesia staff entered. ? ?1.      Managed anesthesia care. ?2.     0.15ml of Shugarcaine was instilled following the paracentesis ?  ?COMPLICATIONS:  None. ?  ?TECHNIQUE:   Stop and chop ?  ?DESCRIPTION OF PROCEDURE:  The patient was examined and consented in the preoperative holding area where the aforementioned topical anesthesia was applied to the left eye and then brought back to the Operating Room where the left eye was prepped and draped in the usual sterile ophthalmic fashion and a lid speculum was placed. A paracentesis was created with the side port blade and the anterior chamber was filled with viscoelastic. A near clear corneal incision was performed with the steel keratome. A continuous curvilinear capsulorrhexis was performed with a cystotome followed by the capsulorrhexis forceps. Hydrodissection and hydrodelineation were carried out with BSS on a blunt cannula. The lens was removed in a stop and chop  technique and the remaining cortical material was removed with the irrigation-aspiration handpiece. The capsular bag was inflated with viscoelastic and the Technis [id number] lens was placed in the capsular bag without complication. The remaining viscoelastic was removed from the eye with the irrigation-aspiration handpiece. The wounds were hydrated. The anterior chamber was flushed with BSS and the eye was inflated to physiologic pressure. 0.58ml Cefuroxime was placed in the anterior chamber. The wounds were found to be water tight. The eye was dressed with Combigan. The patient was given protective glasses to wear throughout the day and a shield with which to sleep tonight. The patient was also given drops with which to begin a drop regimen today and will follow-up with me  in one day. ?Implant Name Type Inv. Item Serial No. Manufacturer Lot No. LRB No. Used Action  ?LENS IOL TECNIS EYHANCE 19.5 - [id number] Intraocular Lens LENS IOL TECNIS EYHANCE 19.5 [id number] [employer]  Left 1 Implanted  ?  ?Procedure(s) with comments: ?CATARACT EXTRACTION PHACO AND INTRAOCULAR LENS PLACEMENT (IOC) LEFT DIABETIC 14.90 01:26.8 (Left) - Diabetic ? ?Electronically signed: Birder Robson 02/13/2022 7:51 AM ? ?

## 2022-02-13 NOTE — Anesthesia Procedure Notes (Signed)
Procedure Name: [place] ?Date/Time: 02/13/2022 7:37 AM ?Performed by: Cameron Ali, CRNA ?Pre-anesthesia Checklist: Patient identified, Emergency Drugs available, Suction available, Timeout performed and Patient being monitored ?Patient Re-evaluated:Patient Re-evaluated prior to induction ?Oxygen Delivery Method: Nasal cannula ?Placement Confirmation: positive ETCO2 ? ? ? ? ?

## 2022-02-13 NOTE — Transfer of Care (Signed)
Immediate Anesthesia Transfer of Care Note ? ?Patient: Jocelyn Elliott ? ?Procedure(s) Performed: CATARACT EXTRACTION PHACO AND INTRAOCULAR LENS PLACEMENT (IOC) LEFT DIABETIC 14.90 01:26.8 (Left: Eye) ? ?Patient Location: PACU ? ?Anesthesia Type: MAC ? ?Level of Consciousness: awake, alert  and patient cooperative ? ?Airway and Oxygen Therapy: Patient Spontanous Breathing and Patient connected to supplemental oxygen ? ?Post-op Assessment: Post-op Vital signs reviewed, Patient's Cardiovascular Status Stable, Respiratory Function Stable, Patent Airway and No signs of Nausea or vomiting ? ?Post-op Vital Signs: Reviewed and stable ? ?Complications: No notable events documented. ? ?

## 2022-02-13 NOTE — H&P (Signed)
[place]  ? ?Primary Care Physician:  Einar Pheasant, MD ?Ophthalmologist: Dr. George Ina ? ?Pre-Procedure History & Physical: ?HPI:  Jocelyn Elliott is a 88 y.o. female here for cataract surgery. ?  ?Past Medical History:  ?Diagnosis Date  ? Arthritis   ? AV malformation of gastrointestinal tract   ? Blood in stool   ? CAD (coronary artery disease)   ? Carotid arterial disease ([place])   ? Cataracts, bilateral   ? Chronic blood loss anemia   ? Chronic cystitis   ? CKD (chronic kidney disease), stage III ([place])   ? Complication of anesthesia   ? reaction to propofol - caused heart to pause  ? Depression   ? Diabetes mellitus ([place])   ? GERD (gastroesophageal reflux disease)   ? Hyperlipidemia   ? Hypertension   ? IDA (iron deficiency anemia)   ? Stress incontinence   ? TIA (transient ischemic attack) 03/2021  ? No deficits  ? Wears dentures   ? full upper and lower  ? ? ?Past Surgical History:  ?Procedure Laterality Date  ? ABDOMINAL HYSTERECTOMY  11/19/1970  ? ovaries left in place  ? APPENDECTOMY  11/19/1990  ? BACK SURGERY  06/08/2010  ? L3, L4, L5  ? CARDIAC CATHETERIZATION  02/2001  ? CAROTID ARTERY ANGIOPLASTY  09/19/2002  ? [place] 89  ? right then left   ? CHOLECYSTECTOMY  11/20/1991  ? COLONOSCOPY WITH PROPOFOL N/A 02/02/2019  ? Procedure: COLONOSCOPY WITH PROPOFOL;  Surgeon: Manya Silvas, MD;  Location: [facility];  Service: Endoscopy;  Laterality: N/A;  ? ESOPHAGOGASTRODUODENOSCOPY N/A 02/02/2019  ? Procedure: ESOPHAGOGASTRODUODENOSCOPY (EGD);  Surgeon: Manya Silvas, MD;  Location: [facility] END[facility]COPY;  Service: Endoscopy;  Laterality: N/A;  ? FOOT SURGERY  06/20/2007  ? Left  ? right carotid artery surgery  01/09/2013  ? Dr. Delana Meyer @ [facility]  ? TOTAL HIP ARTHROPLASTY  05/03/2011  ? left 12, right 11/07  ? ? ?Prior to Admission medications   ?Medication Sig Start Date End Date Taking? Authorizing Provider  ?albuterol (VENTOLIN HFA) 108 (90 Base) MCG/ACT inhaler TAKE 2  PUFFS BY MOUTH EVERY 6 HOURS AS NEEDED FOR WHEEZE OR SHORTNESS OF BREATH 09/15/21  Yes Einar Pheasant, MD  ?aspirin 81 MG tablet Take 81 mg by mouth daily.   Yes [provider]  ?Biotin 5000 MCG CAPS Take 1 capsule by mouth daily.   Yes [provider]  ?cetirizine (ZYRTEC) 10 MG tablet Take 10 mg by mouth daily.   Yes [provider]  ?docusate sodium (COLACE) 100 MG capsule Take 100 mg by mouth daily as needed for mild constipation.   Yes [provider]  ?ferrous sulfate 325 (65 FE) MG EC tablet Take 325 mg by mouth daily with breakfast. Every other day   Yes [provider]  ?isosorbide mononitrate (IMDUR) 30 MG 24 hr tablet Take 30 mg by mouth daily. 04/17/19 07/12/26 Yes [provider]  ?metFORMIN (GLUCOPHAGE) 500 MG tablet TAKE 1 TABLET BY MOUTH EVERY DAY WITH BREAKFAST 09/13/21  Yes Einar Pheasant, MD  ?Omega-3 Fatty Acids (FISH OIL) 1200 MG CAPS Take 1 capsule by mouth daily.   Yes [provider]  ?Probiotic, Lactobacillus, CAPS  01/03/22  Yes [provider]  ?rosuvastatin (CRESTOR) 40 MG tablet TAKE 1 TABLET BY MOUTH EVERY DAY 08/08/21  Yes Einar Pheasant, MD  ?traZODone (DESYREL) 50 MG tablet TAKE 0.5-1 TABLETS BY MOUTH AT BEDTIME AS NEEDED FOR SLEEP. 12/25/21  Yes Einar Pheasant, MD  ?blood glucose meter kit and supplies KIT Dispense based on insurance preference. Use daily to check sugars once daily. Dx e11.9 09/29/19   Einar Pheasant, MD  ?Lancets [facility] DELICA PLUS [id number]) MISC USE AS INSTRUCTED TO CHECK BLOOD SUGARS ONCE DAILY. DX E 11.9 01/30/21   Einar Pheasant, MD  ?[facility] ULTRA test strip USE AS INSTRUCTED TO CHECK BLOOD SUGARS ONCE DAILY. DX E11.9 12/04/21   Einar Pheasant, MD  ? ? ?Allergies as of 01/04/2022 - Review Complete 11/09/2021  ?Allergen Reaction Noted  ? Ciprofloxacin  09/04/2012  ? Levaquin [levofloxacin]  09/04/2012  ? Propofol Other (See Comments) 02/16/2019  ? Tequin [gatifloxacin] Hives and Rash  05/29/2013  ? ? ?Family History  ?Problem Relation Age of Onset  ? Hyperlipidemia Father   ? Heart disease Father   ?     myocardial infarction  ? Hypertension Father   ?     Parent  ? Arthritis Other   ?     parent  ? Diabetes Other   ?     nephew  ? Cervical cancer Sister   ? Rectal cancer Sister   ? Breast cancer Neg Hx   ? ? ?Social History  ? ?Socioeconomic History  ? Marital status: Widowed  ?  Spouse name: Not on file  ? Number of children: 1  ? Years of education: 54  ? Highest education level: Not on file  ?Occupational History  ? Occupation: Retired  ?Tobacco Use  ? Smoking status: Former  ?  Types: Cigarettes  ?  Quit date: 11/19/1989  ?  Years since quitting: 32.2  ? Smokeless tobacco: Never  ?Vaping Use  ? Vaping Use: Never used  ?Substance and Sexual Activity  ? Alcohol use: No  ?  Alcohol/week: 0.0 standard drinks  ? Drug use: No  ? Sexual activity: Not Currently  ?Other Topics Concern  ? Not on file  ?Social History Narrative  ? Regular exercise-no  ? Caffeine Use-yes  ? ?Social Determinants of Health  ? ?Financial Resource Strain: Low Risk   ? Difficulty of Paying Living Expenses: Not hard at all  ?Food Insecurity: No Food Insecurity  ? Worried About Charity fundraiser in the Last Year: Never true  ? Ran Out of Food in the Last Year: Never true  ?Transportation Needs: No Transportation Needs  ? Lack of Transportation (Medical): No  ? Lack of Transportation (Non-Medical): No  ?Physical Activity: Not on file  ?Stress: No Stress Concern Present  ? Feeling of Stress : Not at all  ?Social Connections: Unknown  ? Frequency of Communication with Friends and Family: More than three times a week  ? Frequency of Social Gatherings with Friends and Family: Three times a week  ? Attends Religious Services: Not on file  ? Active Member of Clubs or Organizations: Not on file  ? Attends Archivist Meetings: Not on file  ? Marital Status: Widowed  ?Intimate Partner Violence: Not At Risk  ? Fear of Current  or Ex-Partner: No  ? Emotionally Abused: No  ? Physically Abused: No  ? Sexually Abused: No  ? ? ?Review of Systems: ?See HPI, otherwise negative ROS ? ?Physical Exam: ?BP (!) 155/75   Pulse 74   Temp 97.7 ?F (36.5 ?C) (Temporal)   Ht 4' 11"  (1.499 m)   Wt 59 kg   SpO2 98%   BMI 26.26 kg/m?  ?General:   Alert, cooperative in NAD ?Head:  Normocephalic and atraumatic. ?Respiratory:  Normal work of breathing. ?Cardiovascular:  RRR ? ?Impression/Plan: ?ARRABELLA WESTERMAN is here for cataract surgery. ? ?Risks, benefits, limitations, and alternatives regarding cataract surgery have been reviewed with the patient.  Questions have been answered.  All parties agreeable. ? ? ?Birder Robson, MD  02/13/2022, 7:15 AM ? ? ?

## 2022-02-13 NOTE — Anesthesia Postprocedure Evaluation (Signed)
Anesthesia Post Note ? ?Patient: Jocelyn Elliott ? ?Procedure(s) Performed: CATARACT EXTRACTION PHACO AND INTRAOCULAR LENS PLACEMENT (IOC) LEFT DIABETIC 14.90 01:26.8 (Left: Eye) ? ? ?  ?Patient location during evaluation: PACU ?Anesthesia Type: MAC ?Level of consciousness: awake and alert ?Pain management: pain level controlled ?Vital Signs Assessment: post-procedure vital signs reviewed and stable ?Respiratory status: spontaneous breathing, nonlabored ventilation, respiratory function stable and patient connected to nasal cannula oxygen ?Cardiovascular status: stable and blood pressure returned to baseline ?Postop Assessment: no apparent nausea or vomiting ?Anesthetic complications: no ? ? ?No notable events documented. ? ?Trecia Rogers ? ? ? ? ? ?

## 2022-02-14 ENCOUNTER — Other Ambulatory Visit: Payer: Self-pay

## 2022-02-14 ENCOUNTER — Encounter: Payer: Self-pay | Admitting: Ophthalmology

## 2022-02-23 ENCOUNTER — Other Ambulatory Visit: Payer: Self-pay | Admitting: Internal Medicine

## 2022-02-27 ENCOUNTER — Inpatient Hospital Stay: Payer: Medicare Other

## 2022-02-27 ENCOUNTER — Inpatient Hospital Stay: Payer: Medicare Other | Attending: Internal Medicine

## 2022-02-27 ENCOUNTER — Inpatient Hospital Stay: Payer: Medicare Other | Admitting: Internal Medicine

## 2022-02-27 ENCOUNTER — Other Ambulatory Visit: Payer: Self-pay

## 2022-02-27 DIAGNOSIS — D631 Anemia in chronic kidney disease: Secondary | ICD-10-CM | POA: Insufficient documentation

## 2022-02-27 DIAGNOSIS — N184 Chronic kidney disease, stage 4 (severe): Secondary | ICD-10-CM | POA: Diagnosis present

## 2022-02-27 DIAGNOSIS — N183 Chronic kidney disease, stage 3 unspecified: Secondary | ICD-10-CM

## 2022-02-27 DIAGNOSIS — I129 Hypertensive chronic kidney disease with stage 1 through stage 4 chronic kidney disease, or unspecified chronic kidney disease: Secondary | ICD-10-CM | POA: Diagnosis not present

## 2022-02-27 DIAGNOSIS — G459 Transient cerebral ischemic attack, unspecified: Secondary | ICD-10-CM | POA: Diagnosis not present

## 2022-02-27 DIAGNOSIS — D649 Anemia, unspecified: Secondary | ICD-10-CM

## 2022-02-27 LAB — CBC WITH DIFFERENTIAL/PLATELET
Abs Immature Granulocytes: 0.01 10*3/uL (ref 0.00–0.07)
Basophils Absolute: 0.1 10*3/uL (ref 0.0–0.1)
Basophils Relative: 1 %
Eosinophils Absolute: 0.2 10*3/uL (ref 0.0–0.5)
Eosinophils Relative: 4 %
HCT: 29.9 % — ABNORMAL LOW (ref 36.0–46.0)
Hemoglobin: 9.4 g/dL — ABNORMAL LOW (ref 12.0–15.0)
Immature Granulocytes: 0 %
Lymphocytes Relative: 21 %
Lymphs Abs: 1.1 10*3/uL (ref 0.7–4.0)
MCH: 34.6 pg — ABNORMAL HIGH (ref 26.0–34.0)
MCHC: 31.4 g/dL (ref 30.0–36.0)
MCV: 109.9 fL — ABNORMAL HIGH (ref 80.0–100.0)
Monocytes Absolute: 0.3 10*3/uL (ref 0.1–1.0)
Monocytes Relative: 6 %
Neutro Abs: 3.6 10*3/uL (ref 1.7–7.7)
Neutrophils Relative %: 68 %
Platelets: 164 10*3/uL (ref 150–400)
RBC: 2.72 MIL/uL — ABNORMAL LOW (ref 3.87–5.11)
RDW: 15.3 % (ref 11.5–15.5)
WBC: 5.3 10*3/uL (ref 4.0–10.5)
nRBC: 0 % (ref 0.0–0.2)

## 2022-02-27 LAB — BASIC METABOLIC PANEL
Anion gap: 7 (ref 5–15)
BUN: 22 mg/dL (ref 8–23)
CO2: 24 mmol/L (ref 22–32)
Calcium: 8.8 mg/dL — ABNORMAL LOW (ref 8.9–10.3)
Chloride: 107 mmol/L (ref 98–111)
Creatinine, Ser: 1.22 mg/dL — ABNORMAL HIGH (ref 0.44–1.00)
GFR, Estimated: 43 mL/min — ABNORMAL LOW (ref >60)
Glucose, Bld: 100 mg/dL — ABNORMAL HIGH (ref 70–99)
Potassium: 4.1 mmol/L (ref 3.5–5.1)
Sodium: 138 mmol/L (ref 135–145)

## 2022-02-27 LAB — SAMPLE TO BLOOD BANK

## 2022-02-27 MED FILL — Ferumoxytol Inj 510 MG/17ML (30 MG/ML) (Elemental Fe): INTRAVENOUS | Qty: 17 | Status: AC

## 2022-02-28 ENCOUNTER — Encounter: Payer: Self-pay | Admitting: Internal Medicine

## 2022-02-28 ENCOUNTER — Inpatient Hospital Stay: Payer: Medicare Other

## 2022-02-28 ENCOUNTER — Inpatient Hospital Stay (HOSPITAL_BASED_OUTPATIENT_CLINIC_OR_DEPARTMENT_OTHER): Payer: Medicare Other | Admitting: Internal Medicine

## 2022-02-28 VITALS — BP 117/52 | HR 72 | Temp 96.0°F

## 2022-02-28 DIAGNOSIS — N183 Chronic kidney disease, stage 3 unspecified: Secondary | ICD-10-CM

## 2022-02-28 DIAGNOSIS — D508 Other iron deficiency anemias: Secondary | ICD-10-CM

## 2022-02-28 DIAGNOSIS — D631 Anemia in chronic kidney disease: Secondary | ICD-10-CM

## 2022-02-28 DIAGNOSIS — N184 Chronic kidney disease, stage 4 (severe): Secondary | ICD-10-CM | POA: Diagnosis not present

## 2022-02-28 DIAGNOSIS — D649 Anemia, unspecified: Secondary | ICD-10-CM

## 2022-02-28 MED ORDER — SODIUM CHLORIDE 0.9 % IV SOLN
Freq: Once | INTRAVENOUS | Status: AC
  Filled 2022-02-28: qty 250

## 2022-02-28 MED ORDER — SODIUM CHLORIDE 0.9 % IV SOLN
510.0000 mg | Freq: Once | INTRAVENOUS | Status: DC
  Filled 2022-02-28: qty 17

## 2022-02-28 MED ORDER — CYANOCOBALAMIN 1000 MCG/ML IJ SOLN
1000.0000 ug | Freq: Once | INTRAMUSCULAR | Status: AC
  Administered 2022-02-28: 1000 ug via INTRAMUSCULAR
  Filled 2022-02-28: qty 1

## 2022-02-28 MED ORDER — SODIUM CHLORIDE 0.9 % IV SOLN
510.0000 mg | Freq: Once | INTRAVENOUS | Status: AC
  Administered 2022-02-28: 510 mg via INTRAVENOUS
  Filled 2022-02-28: qty 17

## 2022-02-28 NOTE — Assessment & Plan Note (Addendum)
#  Severe anemia hemoglobin-nadir 6 [summer 2020]. 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  ? ?# continue Feraheme monthly.FEB 2023- sat- 14%; ferritin 30. Discussed use of erythropoietin stimulating agents like retacrit to stimulate the bone marrow.  Discussed the potential issues with erythropoietin estimating agents-given the risk of stroke thromboembolic events/elevated blood pressure.  However, most of the serious events did not happen when the goal hematocrit is 33/hemoglobin 30.   Also consider Retacrit/orderd.  ? ?# B12 def- FEB 2023- 232; start B12 injections.[march 2023]  ? ?# CKD stage III-1.5- GFR- 40s STABLE;  Recommend continued p.o. intake.;  See above ? ?# TIA: on plavix [Dr.Shah] on PT. ? ?# cataract surgery-pt cleared from hematology standpoint.  ? ?# DISPOSITION:  ?# Ferrahem today; B12 injections; no blood  ?# in 4 weeks- MD- labs-cbc;bmp; HOLD tube;-possible 1 unit of PRBC or Ferrahem or possible retacrit; B12 injection- Dr.B ? ? ? ?

## 2022-02-28 NOTE — Progress Notes (Signed)
[place] ?OFFICE PROGRESS NOTE ? ?Patient Care Team: ?Einar Pheasant, MD as PCP - General (Internal Medicine) ?Einar Pheasant, MD (Internal Medicine) ?Isaias Cowman, MD (Internal Medicine) ?Brendolyn Patty, MD (Specialist) ?Schnier, Dolores Lory, MD (Vascular Surgery) ?Isaias Cowman, MD (Internal Medicine) ?Brendolyn Patty, MD (Specialist) ?Schnier, Dolores Lory, MD (Vascular Surgery) ? ? ?SUMMARY OF HEMATOLOGIC HISTORY: ? ?# IRON DEFICIENCY ANEMIA- recurrent [? GI blood loss; Dr.Elliot; AVM-capsule]; IV Ferrahem q 17m last colo- 2012/march; EGD-- [id number]  July 2020 bone marrow biopsy-mild dysplastic changes; 40% hypercellularity; cytogenetics-normal.-[place]?#Colonoscopy-April 2020 aborted because of cardiac pause. ? ?# CKD stage III-IV; CAD-Dr. PSaralyn Pilar  March 30th, 2022-TIA/slurred spleech [Dr.Shah] ? ?INTERVAL HISTORY: In a wheelchair.  Accompanied by her daughter RShirlean Mylar ? ? 88-year-old female patient with above history of recurrent iron deficiency anemia unclear etiology/?-related to CKD is here for follow-up. ? ?In the interim patient had cataract surgery of the left eye.  Uneventful. ? ?Patient has not had any blood transfusions in the last 1 month.  Patient received Feraheme infusion approximately a month ago.  ? ?Patient admits to improvement of her fatigue .  Denies any blood in stools or black or stools. no nausea no vomiting.  Patient is awaiting to have right eye surgery. ? ?Review of Systems  ?Constitutional:  Positive for malaise/fatigue. Negative for chills, diaphoresis, fever and weight loss.  ?HENT:  Negative for nosebleeds and sore throat.   ?Eyes:  Negative for double vision.  ?Respiratory:  Negative for cough, hemoptysis, sputum production and wheezing.   ?Cardiovascular:  Negative for palpitations, orthopnea and leg swelling.  ?Gastrointestinal:  Negative for abdominal pain, blood in stool, constipation, diarrhea,  heartburn, melena, nausea and vomiting.  ?Genitourinary:  Negative for dysuria, frequency and urgency.  ?Musculoskeletal:  Negative for back pain and joint pain.  ?Skin: Negative.  Negative for itching and rash.  ?Neurological:  Negative for dizziness, tingling, focal weakness, weakness and headaches.  ?Endo/Heme/Allergies:  Does not bruise/bleed easily.  ?Psychiatric/Behavioral:  Negative for depression. The patient is not nervous/anxious and does not have insomnia.   ? ? ?PAST MEDICAL HISTORY :  ?Past Medical History:  ?Diagnosis Date  ? Arthritis   ? AV malformation of gastrointestinal tract   ? Blood in stool   ? CAD (coronary artery disease)   ? Carotid arterial disease ([place]   ? Cataracts, bilateral   ? Chronic blood loss anemia   ? Chronic cystitis   ? CKD (chronic kidney disease), stage III ([place]   ? Complication of anesthesia   ? reaction to propofol - caused heart to pause  ? Depression   ? Diabetes mellitus ([place]   ? GERD (gastroesophageal reflux disease)   ? Hyperlipidemia   ? Hypertension   ? IDA (iron deficiency anemia)   ? Stress incontinence   ? TIA (transient ischemic attack) 03/2021  ? No deficits  ? Wears dentures   ? full upper and lower  ? ? ?PAST SURGICAL HISTORY :   ?Past Surgical History:  ?Procedure Laterality Date  ? ABDOMINAL HYSTERECTOMY  11/19/1970  ? ovaries left in place  ? APPENDECTOMY  11/19/1990  ? BACK SURGERY  06/08/2010  ? L3, L4, L5  ? CARDIAC CATHETERIZATION  02/2001  ? CAROTID ARTERY ANGIOPLASTY  09/19/2002  [place]  ? right then left   ? CATARACT EXTRACTION W/PHACO Left 02/13/2022  ? Procedure: CATARACT EXTRACTION PHACO AND INTRAOCULAR LENS PLACEMENT (IOC)  LEFT DIABETIC 14.90 01:26.8;  Surgeon: Birder Robson, MD;  Location: [place];  Service: Ophthalmology;  Laterality: Left;  Diabetic  ? CHOLECYSTECTOMY  11/20/1991  ? COLONOSCOPY WITH PROPOFOL N/A 02/02/2019  ? Procedure: COLONOSCOPY WITH PROPOFOL;  Surgeon: Manya Silvas, MD;   Location[facility] ENDOSCOPY;  Service: Endoscopy;  Laterality: N/A;  ? ESOPHAGOGASTRODUODENOSCOPY N/A 02/02/2019  ? Procedure: ESOPHAGOGASTRODUODENOSCOPY (EGD);  Surgeon: Manya Silvas, MD;  Location[facility] ENDOSCOPY;  Service: Endoscopy;  Laterality: N/A;  ? FOOT SURGERY  06/20/2007  ? Left  ? right carotid artery surgery  01/09/2013  ? Dr. Delana Meyer @ [facility]  ? TOTAL HIP ARTHROPLASTY  05/03/2011  ? left 12, right 11/07  ? ? ?FAMILY HISTORY :   ?Family History  ?Problem Relation Age of Onset  ? Hyperlipidemia Father   ? Heart disease Father   ?     myocardial infarction  ? Hypertension Father   ?     Parent  ? Arthritis Other   ?     parent  ? Diabetes Other   ?     nephew  ? Cervical cancer Sister   ? Rectal cancer Sister   ? Breast cancer Neg Hx   ? ? ?SOCIAL HISTORY:   ?Social History  ? ?Tobacco Use  ? Smoking status: Former  ?  Types: Cigarettes  ?  Quit date: 11/19/1989  ?  Years since quitting: 32.2  ? Smokeless tobacco: Never  ?Vaping Use  ? Vaping Use: Never used  ?Substance Use Topics  ? Alcohol use: No  ?  Alcohol/week: 0.0 standard drinks  ? Drug use: No  ? ? ?ALLERGIES:  is allergic to propofol, ciprofloxacin, levaquin [levofloxacin], and tequin [gatifloxacin]. ? ?MEDICATIONS:  ?Current Outpatient Medications  ?Medication Sig Dispense Refill  ? albuterol (VENTOLIN HFA) 108 (90 Base) MCG/ACT inhaler TAKE 2 PUFFS BY MOUTH EVERY 6 HOURS AS NEEDED FOR WHEEZE OR SHORTNESS OF BREATH 18 g 1  ? aspirin 81 MG tablet Take 81 mg by mouth daily.    ? Biotin 5000 MCG CAPS Take 1 capsule by mouth daily.    ? blood glucose meter kit and supplies KIT Dispense based on insurance preference. Use daily to check sugars once daily. Dx e11.9 1 each 0  ? cetirizine (ZYRTEC) 10 MG tablet Take 10 mg by mouth daily.    ? docusate sodium (COLACE) 100 MG capsule Take 100 mg by mouth daily as needed for mild constipation.    ? ferrous sulfate 325 (65 FE) MG EC tablet Take 325 mg by mouth daily with breakfast. Every other day    ?  isosorbide mononitrate (IMDUR) 30 MG 24 hr tablet Take 30 mg by mouth daily.    ? ketorolac (ACULAR) 0.5 % ophthalmic solution SMARTSIG:In Eye(s)    ? Lancets (ONETOUCH DELICA PLUS [id number]) MISC USE AS INSTRUCTED TO CHECK BLOOD SUGARS ONCE DAILY. DX E 11.9 100 each 12  ? metFORMIN (GLUCOPHAGE) 500 MG tablet TAKE 1 TABLET BY MOUTH EVERY DAY WITH BREAKFAST 90 tablet 1  ? Omega-3 Fatty Acids (FISH OIL) 1200 MG CAPS Take 1 capsule by mouth daily.    ? ONETOUCH ULTRA test strip USE AS INSTRUCTED TO CHECK BLOOD SUGARS ONCE DAILY. DX E11.9 100 strip 12  ? prednisoLONE acetate (PRED FORTE) 1 % ophthalmic suspension SMARTSIG:In Eye(s)    ? Probiotic, Lactobacillus, CAPS     ? rosuvastatin (CRESTOR) 40 MG tablet TAKE 1 TABLET BY MOUTH EVERY DAY 90 tablet 1  ?  traZODone (DESYREL) 50 MG tablet TAKE 0.5-1 TABLETS BY MOUTH AT BEDTIME AS NEEDED FOR SLEEP. 90 tablet 1  ? ?No current facility-administered medications for this visit.  ? ? ?PHYSICAL EXAMINATION: ? ? ?BP (!) 98/53   Pulse 77   Temp 97.9 ?F (36.6 ?C)   Resp 16   Wt 134 lb 3.2 oz (60.9 kg)   BMI 27.11 kg/m?  ? ?Filed Weights  ? 02/28/22 0900  ?Weight: 134 lb 3.2 oz (60.9 kg)  ? ? ?Physical Exam ?Constitutional:   ?   Comments: Frail-appearing Caucasian female patient.  She is in a wheelchair.  ?HENT:  ?   Head: Normocephalic and atraumatic.  ?   Mouth/Throat:  ?   Pharynx: No oropharyngeal exudate.  ?Eyes:  ?   Pupils: Pupils are equal, round, and reactive to light.  ?   Comments: Positive for pallor.  ?Cardiovascular:  ?   Rate and Rhythm: Normal rate and regular rhythm.  ?Pulmonary:  ?   Effort: No respiratory distress.  ?   Breath sounds: No wheezing.  ?Abdominal:  ?   General: Bowel sounds are normal. There is no distension.  ?   Palpations: Abdomen is soft. There is no mass.  ?   Tenderness: There is no abdominal tenderness. There is no guarding or rebound.  ?Musculoskeletal:     ?   General: No tenderness. Normal range of motion.  ?   Cervical back: Normal  range of motion and neck supple.  ?Skin: ?   General: Skin is warm.  ?Neurological:  ?   Mental Status: She is alert and oriented to person, place, and time.  ?Psychiatric:     ?   Mood and Affect: Affect normal.  ?

## 2022-02-28 NOTE — Progress Notes (Signed)
Patient denies new problems/concerns today.   °

## 2022-02-28 NOTE — Patient Instructions (Signed)

## 2022-03-02 NOTE — Discharge Instructions (Signed)

## 2022-03-06 ENCOUNTER — Encounter: Payer: Self-pay | Admitting: Ophthalmology

## 2022-03-06 ENCOUNTER — Other Ambulatory Visit: Payer: Self-pay

## 2022-03-06 ENCOUNTER — Encounter
Admission: RE | Disposition: A | Discharge status: Home / Self Care | Payer: Self-pay | Source: Home / Self Care | Attending: Ophthalmology

## 2022-03-06 ENCOUNTER — Ambulatory Visit: Payer: Medicare Other | Admitting: Anesthesiology

## 2022-03-06 ENCOUNTER — Ambulatory Visit
Admission: RE | Admit: 2022-03-06 | Discharge: 2022-03-06 | Disposition: A | Discharge status: Home / Self Care | Payer: Medicare Other | Attending: Ophthalmology | Admitting: Ophthalmology

## 2022-03-06 DIAGNOSIS — H2511 Age-related nuclear cataract, right eye: Secondary | ICD-10-CM | POA: Insufficient documentation

## 2022-03-06 DIAGNOSIS — E1122 Type 2 diabetes mellitus with diabetic chronic kidney disease: Secondary | ICD-10-CM | POA: Insufficient documentation

## 2022-03-06 DIAGNOSIS — Z9181 History of falling: Secondary | ICD-10-CM | POA: Diagnosis not present

## 2022-03-06 DIAGNOSIS — Z87891 Personal history of nicotine dependence: Secondary | ICD-10-CM | POA: Insufficient documentation

## 2022-03-06 DIAGNOSIS — D759 Disease of blood and blood-forming organs, unspecified: Secondary | ICD-10-CM | POA: Insufficient documentation

## 2022-03-06 DIAGNOSIS — N183 Chronic kidney disease, stage 3 unspecified: Secondary | ICD-10-CM | POA: Diagnosis not present

## 2022-03-06 DIAGNOSIS — D649 Anemia, unspecified: Secondary | ICD-10-CM | POA: Diagnosis not present

## 2022-03-06 DIAGNOSIS — I2510 Atherosclerotic heart disease of native coronary artery without angina pectoris: Secondary | ICD-10-CM | POA: Insufficient documentation

## 2022-03-06 DIAGNOSIS — I129 Hypertensive chronic kidney disease with stage 1 through stage 4 chronic kidney disease, or unspecified chronic kidney disease: Secondary | ICD-10-CM | POA: Insufficient documentation

## 2022-03-06 HISTORY — PX: CATARACT EXTRACTION W/PHACO: SHX586

## 2022-03-06 LAB — GLUCOSE, CAPILLARY
Glucose-Capillary: 81 mg/dL (ref 70–99)
Glucose-Capillary: 82 mg/dL (ref 70–99)

## 2022-03-06 SURGERY — PHACOEMULSIFICATION, CATARACT, WITH IOL INSERTION
Anesthesia: Monitor Anesthesia Care | Site: Eye | Laterality: Right

## 2022-03-06 MED ORDER — EPINEPHRINE PF 1 MG/ML IJ SOLN
INTRAMUSCULAR | Status: DC | PRN
  Administered 2022-03-06: 58 mL via OPHTHALMIC

## 2022-03-06 MED ORDER — SIGHTPATH DOSE#1 BSS IO SOLN
INTRAOCULAR | Status: DC | PRN
  Administered 2022-03-06: 15 mL

## 2022-03-06 MED ORDER — OXYCODONE HCL 5 MG/5ML PO SOLN: 5.0000 mg | Freq: Once | ORAL | Status: DC | PRN

## 2022-03-06 MED ORDER — SIGHTPATH DOSE#1 NA CHONDROIT SULF-NA HYALURON 40-17 MG/ML IO SOLN
INTRAOCULAR | Status: DC | PRN
  Administered 2022-03-06: 1 mL via INTRAOCULAR

## 2022-03-06 MED ORDER — POLYMYXIN B-TRIMETHOPRIM 10000-0.1 UNIT/ML-% OP SOLN
OPHTHALMIC | Status: DC | PRN
  Administered 2022-03-06: 1 [drp] via OPHTHALMIC

## 2022-03-06 MED ORDER — OXYCODONE HCL 5 MG PO TABS: 5.0000 mg | ORAL_TABLET | Freq: Once | ORAL | Status: DC | PRN

## 2022-03-06 MED ORDER — TETRACAINE HCL 0.5 % OP SOLN
1.0000 [drp] | OPHTHALMIC | Status: AC | PRN
  Administered 2022-03-06 (×3): 1 [drp] via OPHTHALMIC

## 2022-03-06 MED ORDER — ARMC OPHTHALMIC DILATING DROPS
1.0000 "application " | OPHTHALMIC | Status: AC | PRN
  Administered 2022-03-06 (×3): 1 via OPHTHALMIC

## 2022-03-06 MED ORDER — LACTATED RINGERS IV SOLN: INTRAVENOUS | Status: DC

## 2022-03-06 MED ORDER — MIDAZOLAM HCL 2 MG/2ML IJ SOLN
INTRAMUSCULAR | Status: DC | PRN
  Administered 2022-03-06: .5 mg via INTRAVENOUS

## 2022-03-06 MED ORDER — FENTANYL CITRATE (PF) 100 MCG/2ML IJ SOLN
INTRAMUSCULAR | Status: DC | PRN
  Administered 2022-03-06: 50 ug via INTRAVENOUS

## 2022-03-06 MED ORDER — BRIMONIDINE TARTRATE-TIMOLOL 0.2-0.5 % OP SOLN
OPHTHALMIC | Status: DC | PRN
  Administered 2022-03-06: 1 [drp] via OPHTHALMIC

## 2022-03-06 MED ORDER — SIGHTPATH DOSE#1 BSS IO SOLN
INTRAOCULAR | Status: DC | PRN
  Administered 2022-03-06: 1 mL via INTRAMUSCULAR

## 2022-03-06 SURGICAL SUPPLY — 10 items
CATARACT SUITE SIGHTPATH (MISCELLANEOUS) ×2 IMPLANT
FEE CATARACT SUITE SIGHTPATH (MISCELLANEOUS) ×1 IMPLANT
GLOVE SURG ENC TEXT LTX SZ8 (GLOVE) ×2 IMPLANT
GLOVE SURG TRIUMPH 8.0 PF LTX (GLOVE) ×2 IMPLANT
LENS IOL TECNIS EYHANCE 20.5 (Intraocular Lens) ×1 IMPLANT
NDL FILTER BLUNT 18X1 1/2 (NEEDLE) ×1 IMPLANT
NEEDLE FILTER BLUNT 18X 1/2SAF (NEEDLE) ×1
NEEDLE FILTER BLUNT 18X1 1/2 (NEEDLE) ×1 IMPLANT
SYR 3ML LL SCALE MARK (SYRINGE) ×2 IMPLANT
WATER STERILE IRR 250ML POUR (IV SOLUTION) ×2 IMPLANT

## 2022-03-06 NOTE — Anesthesia Preprocedure Evaluation (Signed)
Anesthesia Evaluation  ?Patient identified by MRN, date of birth, ID band ?Patient awake ? ? ? ?Reviewed: ?Allergy & Precautions, H&P , NPO status , Patient's Chart, lab work & pertinent test results, reviewed documented beta blocker date and time  ? ?History of Anesthesia Complications ?Negative for: history of anesthetic complications ("reaction to propofol - caused heart to pause") ? ?Airway ?Mallampati: II ? ?TM Distance: >3 FB ?Neck ROM: full ? ? ? Dental ?no notable dental hx. ?(+) Upper Dentures, Lower Dentures ?  ?Pulmonary ?neg pulmonary ROS, former smoker,  ?  ?Pulmonary exam normal ?breath sounds clear to auscultation ? ? ? ? ? ? Cardiovascular ?Exercise Tolerance: Good ?hypertension, (-) angina+ CAD  ?Normal cardiovascular exam+ dysrhythmias (once 2020) Atrial Fibrillation + Valvular Problems/Murmurs (mod AS) AS  ?Rhythm:regular Rate:Normal ? ?cards:  Halford Decamp, PA at 01/03/2021 ; ? ? ?echo: 2020: ?NORMAL LEFT VENTRICULAR SYSTOLIC FUNCTION ? WITH MODERATE LVH  ?NORMAL RIGHT?VENTRICULAR SYSTOLIC FUNCTION  ?MILD VALVULAR REGURGITATION  ?MODERATE VALVULAR STENOSIS  ?AVA(VTI)=.86cm^2  ?MILD to MODERATE MR  ?MODERATE AS  ?MILD AR, TR, PR  ?EF >55% ; ?  ?Neuro/Psych ?PSYCHIATRIC DISORDERS Anxiety Depression Recurrent falls; ? ?neuro: Ray Church, MD at 02/18/2021 ; ? ? ?TIA (2020)  ? GI/Hepatic ?Neg liver ROS, GERD  Controlled,  ?Endo/Other  ?diabetes ? Renal/GU ?CRFRenal disease (ckd3)  ?negative genitourinary ?  ?Musculoskeletal ? ?(+) Arthritis ,  ? Abdominal ?  ?Peds ? Hematology ? ?(+) Blood dyscrasia, anemia ,   ?Anesthesia Other Findings ?'propofol caused heart to stop' during colonoscopy 2020; ? ?had ecce on 02/13/2022; ? Reproductive/Obstetrics ?negative OB ROS ? ?  ? ? ? ? ? ? ? ? ? ? ? ? ? ?  ?  ? ? ? ? ? ? ? ? ?Anesthesia Physical ? ?Anesthesia Plan ? ?ASA: 3 ? ?Anesthesia Plan: MAC  ? ?Post-op Pain Management:   ? ?Induction:  ? ?PONV Risk  Score and Plan: TIVA and Midazolam ? ?Airway Management Planned:  ? ?Additional Equipment:  ? ?Intra-op Plan:  ? ?Post-operative Plan:  ? ?Informed Consent: I have reviewed the patients History and Physical, chart, labs and discussed the procedure including the risks, benefits and alternatives for the proposed anesthesia with the patient or authorized representative who has indicated his/her understanding and acceptance.  ? ? ? ?Dental Advisory Given ? ?Plan Discussed with: CRNA ? ?Anesthesia Plan Comments:   ? ? ? ? ? ? ?Anesthesia Quick Evaluation ? ?

## 2022-03-06 NOTE — Op Note (Signed)
PREOPERATIVE DIAGNOSIS:  Nuclear sclerotic cataract of the right eye. ?  ?POSTOPERATIVE DIAGNOSIS:  H25.11 Cataract ?  ?OPERATIVE PROCEDURE:ORPROCALL@ ?  ?SURGEON:  Birder Robson, MD. ?  ?ANESTHESIA: ? ?Anesthesiologist: Fidel Levy, MD ?CRNA: Silvana Newness, CRNA ? ?1.      Managed anesthesia care. ?2.      0.12ml of Shugarcaine was instilled in the eye following the paracentesis. ?  ?COMPLICATIONS:  None. ?  ?TECHNIQUE:   Stop and chop ?  ?DESCRIPTION OF PROCEDURE:  The patient was examined and consented in the preoperative holding area where the aforementioned topical anesthesia was applied to the right eye and then brought back to the Operating Room where the right eye was prepped and draped in the usual sterile ophthalmic fashion and a lid speculum was placed. A paracentesis was created with the side port blade and the anterior chamber was filled with viscoelastic. A near clear corneal incision was performed with the steel keratome. A continuous curvilinear capsulorrhexis was performed with a cystotome followed by the capsulorrhexis forceps. Hydrodissection and hydrodelineation were carried out with BSS on a blunt cannula. The lens was removed in a stop and chop  technique and the remaining cortical material was removed with the irrigation-aspiration handpiece. The capsular bag was inflated with viscoelastic and the Technis [id number]  lens was placed in the capsular bag without complication. The remaining viscoelastic was removed from the eye with the irrigation-aspiration handpiece. The wounds were hydrated. The anterior chamber was flushed with BSS and the eye was inflated to physiologic pressure. 0.22ml of Vigamox was placed in the anterior chamber. The wounds were found to be water tight. The eye was dressed with Combigan. The patient was given protective glasses to wear throughout the day and a shield with which to sleep tonight. The patient was also given drops with which to begin a drop regimen today and  will follow-up with me in one day. ?Implant Name Type Inv. Item Serial No. Manufacturer Lot No. LRB No. Used Action  ?LENS IOL TECNIS EYHANCE 20.5 - [id number] Intraocular Lens LENS IOL TECNIS EYHANCE 20.5 [id number] [employer]  Right 1 Implanted  ? ?Procedure(s) with comments: ?CATARACT EXTRACTION PHACO AND INTRAOCULAR LENS PLACEMENT (IOC) RIGHT DIABETIC 9.66 00:53.7 (Right) - Diabetic ? ?Electronically signed: Birder Robson 03/06/2022 9:12 AM ? ?

## 2022-03-06 NOTE — Anesthesia Postprocedure Evaluation (Signed)
Anesthesia Post Note ? ?Patient: Jocelyn Elliott ? ?Procedure(s) Performed: CATARACT EXTRACTION PHACO AND INTRAOCULAR LENS PLACEMENT (IOC) RIGHT DIABETIC 9.66 00:53.7 (Right: Eye) ? ? ?  ?Patient location during evaluation: PACU ?Anesthesia Type: MAC ?Level of consciousness: awake and alert ?Pain management: pain level controlled ?Vital Signs Assessment: post-procedure vital signs reviewed and stable ?Respiratory status: spontaneous breathing, nonlabored ventilation, respiratory function stable and patient connected to nasal cannula oxygen ?Cardiovascular status: stable and blood pressure returned to baseline ?Postop Assessment: no apparent nausea or vomiting ?Anesthetic complications: no ? ? ?No notable events documented. ? ?Fidel Levy ? ? ? ? ? ?

## 2022-03-06 NOTE — H&P (Signed)
[place]  ? ?Primary Care Physician:  Einar Pheasant, MD ?Ophthalmologist: Dr. George Ina ? ?Pre-Procedure History & Physical: ?HPI:  Jocelyn Elliott is a 88 y.o. female here for cataract surgery. ?  ?Past Medical History:  ?Diagnosis Date  ? Arthritis   ? AV malformation of gastrointestinal tract   ? Blood in stool   ? CAD (coronary artery disease)   ? Carotid arterial disease ([place])   ? Cataracts, bilateral   ? Chronic blood loss anemia   ? Chronic cystitis   ? CKD (chronic kidney disease), stage III ([place])   ? Complication of anesthesia   ? reaction to propofol - caused heart to pause  ? Depression   ? Diabetes mellitus ([place])   ? GERD (gastroesophageal reflux disease)   ? Hyperlipidemia   ? Hypertension   ? IDA (iron deficiency anemia)   ? Stress incontinence   ? TIA (transient ischemic attack) 03/2021  ? No deficits  ? Wears dentures   ? full upper and lower  ? ? ?Past Surgical History:  ?Procedure Laterality Date  ? ABDOMINAL HYSTERECTOMY  11/19/1970  ? ovaries left in place  ? APPENDECTOMY  11/19/1990  ? BACK SURGERY  06/08/2010  ? L3, L4, L5  ? CARDIAC CATHETERIZATION  02/2001  ? CAROTID ARTERY ANGIOPLASTY  09/19/2002  ? [place] 89  ? right then left   ? CATARACT EXTRACTION W/PHACO Left 02/13/2022  ? Procedure: CATARACT EXTRACTION PHACO AND INTRAOCULAR LENS PLACEMENT (IOC) LEFT DIABETIC 14.90 01:26.8;  Surgeon: Birder Robson, MD;  Location: [place];  Service: Ophthalmology;  Laterality: Left;  Diabetic  ? CHOLECYSTECTOMY  11/20/1991  ? COLONOSCOPY WITH PROPOFOL N/A 02/02/2019  ? Procedure: COLONOSCOPY WITH PROPOFOL;  Surgeon: Manya Silvas, MD;  Location[facility] ENDOSCOPY;  Service: Endoscopy;  Laterality: N/A;  ? ESOPHAGOGASTRODUODENOSCOPY N/A 02/02/2019  ? Procedure: ESOPHAGOGASTRODUODENOSCOPY (EGD);  Surgeon: Manya Silvas, MD;  Location: [facility] ENDOSCOPY;  Service: Endoscopy;  Laterality: N/A;  ? FOOT SURGERY  06/20/2007  ? Left  ? right carotid artery  surgery  01/09/2013  ? Dr. Delana Meyer @ [facility]  ? TOTAL HIP ARTHROPLASTY  05/03/2011  ? left 12, right 11/07  ? ? ?Prior to Admission medications   ?Medication Sig Start Date End Date Taking? Authorizing Provider  ?aspirin 81 MG tablet Take 81 mg by mouth daily.   Yes [provider]  ?Biotin 5000 MCG CAPS Take 1 capsule by mouth daily.   Yes [provider]  ?blood glucose meter kit and supplies KIT Dispense based on insurance preference. Use daily to check sugars once daily. Dx e11.9 09/29/19  Yes Einar Pheasant, MD  ?cetirizine (ZYRTEC) 10 MG tablet Take 10 mg by mouth daily.   Yes [provider]  ?docusate sodium (COLACE) 100 MG capsule Take 100 mg by mouth daily as needed for mild constipation.   Yes [provider]  ?ferrous sulfate 325 (65 FE) MG EC tablet Take 325 mg by mouth daily with breakfast. Every other day   Yes [provider]  ?isosorbide mononitrate (IMDUR) 30 MG 24 hr tablet Take 30 mg by mouth daily. 04/17/19 07/12/26 Yes [provider]  ?ketorolac (ACULAR) 0.5 % ophthalmic solution SMARTSIG:In Eye(s) 02/23/22  Yes [provider]  ?metFORMIN (GLUCOPHAGE) 500 MG tablet TAKE 1 TABLET BY MOUTH EVERY DAY WITH BREAKFAST 02/26/22  Yes Einar Pheasant, MD  ?Omega-3 Fatty Acids (FISH OIL) 1200 MG CAPS Take 1 capsule by mouth daily.  Yes [provider]  ?Donald Siva test strip USE AS INSTRUCTED TO CHECK BLOOD SUGARS ONCE DAILY. DX E11.9 12/04/21  Yes Einar Pheasant, MD  ?prednisoLONE acetate (PRED FORTE) 1 % ophthalmic suspension SMARTSIG:In Eye(s) 02/23/22  Yes [provider]  ?Probiotic, Lactobacillus, CAPS  01/03/22  Yes [provider]  ?rosuvastatin (CRESTOR) 40 MG tablet TAKE 1 TABLET BY MOUTH EVERY DAY 02/26/22  Yes Einar Pheasant, MD  ?traZODone (DESYREL) 50 MG tablet TAKE 0.5-1 TABLETS BY MOUTH AT BEDTIME AS NEEDED FOR SLEEP. 12/25/21  Yes Einar Pheasant, MD  ?albuterol (VENTOLIN HFA) 108 (90 Base) MCG/ACT  inhaler TAKE 2 PUFFS BY MOUTH EVERY 6 HOURS AS NEEDED FOR WHEEZE OR SHORTNESS OF BREATH 09/15/21   Einar Pheasant, MD  ?Lancets [facility] DELICA PLUS [id number]) MISC USE AS INSTRUCTED TO CHECK BLOOD SUGARS ONCE DAILY. DX E 11.9 01/30/21   Einar Pheasant, MD  ? ? ?Allergies as of 01/04/2022 - Review Complete 11/09/2021  ?Allergen Reaction Noted  ? Ciprofloxacin  09/04/2012  ? Levaquin [levofloxacin]  09/04/2012  ? Propofol Other (See Comments) 02/16/2019  ? Tequin [gatifloxacin] Hives and Rash 05/29/2013  ? ? ?Family History  ?Problem Relation Age of Onset  ? Hyperlipidemia Father   ? Heart disease Father   ?     myocardial infarction  ? Hypertension Father   ?     Parent  ? Arthritis Other   ?     parent  ? Diabetes Other   ?     nephew  ? Cervical cancer Sister   ? Rectal cancer Sister   ? Breast cancer Neg Hx   ? ? ?Social History  ? ?Socioeconomic History  ? Marital status: Widowed  ?  Spouse name: Not on file  ? Number of children: 1  ? Years of education: 51  ? Highest education level: Not on file  ?Occupational History  ? Occupation: Retired  ?Tobacco Use  ? Smoking status: Former  ?  Types: Cigarettes  ?  Quit date: 11/19/1989  ?  Years since quitting: 32.3  ? Smokeless tobacco: Never  ?Vaping Use  ? Vaping Use: Never used  ?Substance and Sexual Activity  ? Alcohol use: No  ?  Alcohol/week: 0.0 standard drinks  ? Drug use: No  ? Sexual activity: Not Currently  ?Other Topics Concern  ? Not on file  ?Social History Narrative  ? Regular exercise-no  ? Caffeine Use-yes  ? ?Social Determinants of Health  ? ?Financial Resource Strain: Low Risk   ? Difficulty of Paying Living Expenses: Not hard at all  ?Food Insecurity: No Food Insecurity  ? Worried About Charity fundraiser in the Last Year: Never true  ? Ran Out of Food in the Last Year: Never true  ?Transportation Needs: No Transportation Needs  ? Lack of Transportation (Medical): No  ? Lack of Transportation (Non-Medical): No  ?Physical Activity: Not on file   ?Stress: No Stress Concern Present  ? Feeling of Stress : Not at all  ?Social Connections: Unknown  ? Frequency of Communication with Friends and Family: More than three times a week  ? Frequency of Social Gatherings with Friends and Family: Three times a week  ? Attends Religious Services: Not on file  ? Active Member of Clubs or Organizations: Not on file  ? Attends Archivist Meetings: Not on file  ? Marital Status: Widowed  ?Intimate Partner Violence: Not At Risk  ? Fear of Current or Ex-Partner: No  ?  Emotionally Abused: No  ? Physically Abused: No  ? Sexually Abused: No  ? ? ?Review of Systems: ?See HPI, otherwise negative ROS ? ?Physical Exam: ?BP 126/63   Pulse 76   Temp (!) 97.2 ?F (36.2 ?C) (Temporal)   Ht 4' 11"  (1.499 m)   Wt 60.4 kg   SpO2 97%   BMI 26.90 kg/m?  ?General:   Alert, cooperative in NAD ?Head:  Normocephalic and atraumatic. ?Respiratory:  Normal work of breathing. ?Cardiovascular:  RRR ? ?Impression/Plan: ?Jocelyn Elliott is here for cataract surgery. ? ?Risks, benefits, limitations, and alternatives regarding cataract surgery have been reviewed with the patient.  Questions have been answered.  All parties agreeable. ? ? ?Birder Robson, MD  03/06/2022, 8:47 AM ? ? ?

## 2022-03-06 NOTE — Transfer of Care (Signed)
Immediate Anesthesia Transfer of Care Note ? ?Patient: Jocelyn Elliott ? ?Procedure(s) Performed: CATARACT EXTRACTION PHACO AND INTRAOCULAR LENS PLACEMENT (IOC) RIGHT DIABETIC 9.66 00:53.7 (Right: Eye) ? ?Patient Location: PACU ? ?Anesthesia Type: MAC ? ?Level of Consciousness: awake, alert  and patient cooperative ? ?Airway and Oxygen Therapy: Patient Spontanous Breathing and Patient connected to supplemental oxygen ? ?Post-op Assessment: Post-op Vital signs reviewed, Patient's Cardiovascular Status Stable, Respiratory Function Stable, Patent Airway and No signs of Nausea or vomiting ? ?Post-op Vital Signs: Reviewed and stable ? ?Complications: No notable events documented. ? ?

## 2022-03-07 ENCOUNTER — Encounter: Payer: Self-pay | Admitting: Ophthalmology

## 2022-03-13 ENCOUNTER — Ambulatory Visit: Payer: Medicare Other | Admitting: Internal Medicine

## 2022-03-13 ENCOUNTER — Encounter: Payer: Self-pay | Admitting: Internal Medicine

## 2022-03-13 VITALS — BP 112/64 | HR 76 | Temp 98.2°F | Resp 17 | Ht 60.0 in | Wt 135.6 lb

## 2022-03-13 DIAGNOSIS — N183 Chronic kidney disease, stage 3 unspecified: Secondary | ICD-10-CM

## 2022-03-13 DIAGNOSIS — I779 Disorder of arteries and arterioles, unspecified: Secondary | ICD-10-CM

## 2022-03-13 DIAGNOSIS — Z1231 Encounter for screening mammogram for malignant neoplasm of breast: Secondary | ICD-10-CM | POA: Diagnosis not present

## 2022-03-13 DIAGNOSIS — I10 Essential (primary) hypertension: Secondary | ICD-10-CM

## 2022-03-13 DIAGNOSIS — D649 Anemia, unspecified: Secondary | ICD-10-CM

## 2022-03-13 DIAGNOSIS — I25119 Atherosclerotic heart disease of native coronary artery with unspecified angina pectoris: Secondary | ICD-10-CM

## 2022-03-13 DIAGNOSIS — E7800 Pure hypercholesterolemia, unspecified: Secondary | ICD-10-CM | POA: Diagnosis not present

## 2022-03-13 DIAGNOSIS — E1159 Type 2 diabetes mellitus with other circulatory complications: Secondary | ICD-10-CM

## 2022-03-13 DIAGNOSIS — D696 Thrombocytopenia, unspecified: Secondary | ICD-10-CM

## 2022-03-13 DIAGNOSIS — I4891 Unspecified atrial fibrillation: Secondary | ICD-10-CM

## 2022-03-13 DIAGNOSIS — I700 Atherosclerosis of aorta: Secondary | ICD-10-CM

## 2022-03-13 LAB — HM DIABETES FOOT EXAM

## 2022-03-13 NOTE — Progress Notes (Signed)
Patient ID: Jocelyn Elliott, female   DOB: March 18, 1932, 88 y.o.   MRN: [id number] ? ? ?Subjective:  ? ? Patient ID: Jocelyn Elliott, female    DOB: 1932-03-01, 88 y.o.   MRN: [id number] ? ?This visit occurred during the SARS-CoV-2 public health emergency.  Safety protocols were in place, including screening questions prior to the visit, additional usage of staff PPE, and extensive cleaning of exam room while observing appropriate contact time as indicated for disinfecting solutions.  ? ?Patient here for a scheduled follow up.  ? ?Chief Complaint  ?Patient presents with  ? Follow-up  ?  F/u for HTN and DM  ? .  ? ?HPI ?She is accompanied by her daughter.  History obtained from both of them.  Is s/p cataract surgery.  Overall she feels she is doing relatively well.  No chest pain.  Breathing stable.  No increased cough or congestion. Seeing Dr Rogue Bussing for f/u anemia.  Most recent hgb 9.4.  no bleeding.  Reviewed outside sugars - averaging 100-130.  Tries to stay active.   ? ? ?Past Medical History:  ?Diagnosis Date  ? Arthritis   ? AV malformation of gastrointestinal tract   ? Blood in stool   ? CAD (coronary artery disease)   ? Carotid arterial disease ([place])   ? Cataracts, bilateral   ? Chronic blood loss anemia   ? Chronic cystitis   ? CKD (chronic kidney disease), stage III ([place])   ? Complication of anesthesia   ? reaction to propofol - caused heart to pause  ? Depression   ? Diabetes mellitus ([place])   ? GERD (gastroesophageal reflux disease)   ? Hyperlipidemia   ? Hypertension   ? IDA (iron deficiency anemia)   ? Stress incontinence   ? TIA (transient ischemic attack) 03/2021  ? No deficits  ? Wears dentures   ? full upper and lower  ? ?Past Surgical History:  ?Procedure Laterality Date  ? ABDOMINAL HYSTERECTOMY  11/19/1970  ? ovaries left in place  ? APPENDECTOMY  11/19/1990  ? BACK SURGERY  06/08/2010  ? L3, L4, L5  ? CARDIAC CATHETERIZATION  02/2001  ? CAROTID ARTERY ANGIOPLASTY  09/19/2002  ? [place], 89  ? right then left   ? CATARACT EXTRACTION W/PHACO Left 02/13/2022  ? Procedure: CATARACT EXTRACTION PHACO AND INTRAOCULAR LENS PLACEMENT (IOC) LEFT DIABETIC 14.90 01:26.8;  Surgeon: Birder Robson, MD;  Location: [facility];  Service: Ophthalmology;  Laterality: Left;  Diabetic  ? CATARACT EXTRACTION W/PHACO Right 03/06/2022  ? Procedure: CATARACT EXTRACTION PHACO AND INTRAOCULAR LENS PLACEMENT (IOC) RIGHT DIABETIC 9.66 00:53.7;  Surgeon: Birder Robson, MD;  Location: [place];  Service: Ophthalmology;  Laterality: Right;  Diabetic  ? CHOLECYSTECTOMY  11/20/1991  ? COLONOSCOPY WITH PROPOFOL N/A 02/02/2019  ? Procedure: COLONOSCOPY WITH PROPOFOL;  Surgeon: Manya Silvas, MD;  Location[facility] ENDOSCOPY;  Service: Endoscopy;  Laterality: N/A;  ? ESOPHAGOGASTRODUODENOSCOPY N/A 02/02/2019  ? Procedure: ESOPHAGOGASTRODUODENOSCOPY (EGD);  Surgeon: Manya Silvas, MD;  Location: [facility] ENDOSCOPY;  Service: Endoscopy;  Laterality: N/A;  ? FOOT SURGERY  06/20/2007  ? Left  ? right carotid artery surgery  01/09/2013  ? Dr. Delana Meyer @ [facility]  ? TOTAL HIP ARTHROPLASTY  05/03/2011  ? left 12, right 11/07  ? ?Family History  ?Problem Relation Age of Onset  ? Hyperlipidemia Father   ? Heart disease Father   ?     myocardial infarction  ? Hypertension  Father   ?     Parent  ? Arthritis Other   ?     parent  ? Diabetes Other   ?     nephew  ? Cervical cancer Sister   ? Rectal cancer Sister   ? Breast cancer Neg Hx   ? ?Social History  ? ?Socioeconomic History  ? Marital status: Widowed  ?  Spouse name: Not on file  ? Number of children: 1  ? Years of education: 70  ? Highest education level: Not on file  ?Occupational History  ? Occupation: Retired  ?Tobacco Use  ? Smoking status: Former  ?  Types: Cigarettes  ?  Quit date: 11/19/1989  ?  Years since quitting: 32.3  ? Smokeless tobacco: Never  ?Vaping Use  ? Vaping Use: Never used  ?Substance and Sexual Activity  ? Alcohol use: No  ?   Alcohol/week: 0.0 standard drinks  ? Drug use: No  ? Sexual activity: Not Currently  ?Other Topics Concern  ? Not on file  ?Social History Narrative  ? Regular exercise-no  ? Caffeine Use-yes  ? ?Social Determinants of Health  ? ?Financial Resource Strain: Low Risk   ? Difficulty of Paying Living Expenses: Not hard at all  ?Food Insecurity: No Food Insecurity  ? Worried About Charity fundraiser in the Last Year: Never true  ? Ran Out of Food in the Last Year: Never true  ?Transportation Needs: No Transportation Needs  ? Lack of Transportation (Medical): No  ? Lack of Transportation (Non-Medical): No  ?Physical Activity: Not on file  ?Stress: No Stress Concern Present  ? Feeling of Stress : Not at all  ?Social Connections: Unknown  ? Frequency of Communication with Friends and Family: More than three times a week  ? Frequency of Social Gatherings with Friends and Family: Three times a week  ? Attends Religious Services: Not on file  ? Active Member of Clubs or Organizations: Not on file  ? Attends Archivist Meetings: Not on file  ? Marital Status: Widowed  ? ? ? ?Review of Systems  ?Constitutional:  Negative for appetite change and unexpected weight change.  ?HENT:  Negative for congestion and sinus pressure.   ?Respiratory:  Negative for cough, chest tightness and shortness of breath.   ?Cardiovascular:  Negative for chest pain, palpitations and leg swelling.  ?Gastrointestinal:  Negative for abdominal pain, diarrhea, nausea and vomiting.  ?Genitourinary:  Negative for difficulty urinating and dysuria.  ?Musculoskeletal:  Negative for joint swelling and myalgias.  ?Skin:  Negative for color change and rash.  ?Neurological:  Negative for dizziness, light-headedness and headaches.  ?Psychiatric/Behavioral:  Negative for agitation and dysphoric mood.   ? ?   ?Objective:  ?  ? ?BP 112/64 (BP Location: Left Arm, Patient Position: Sitting, Cuff Size: Small)   Pulse 76   Temp 98.2 ?F (36.8 ?C) (Temporal)    Resp 17   Ht 5' (1.524 m)   Wt 135 lb 9.6 oz (61.5 kg)   SpO2 98%   BMI 26.48 kg/m?  ?Wt Readings from Last 3 Encounters:  ?03/21/22 134 lb 8 oz (61 kg)  ?03/13/22 135 lb 9.6 oz (61.5 kg)  ?03/06/22 133 lb 3.2 oz (60.4 kg)  ? ? ?Physical Exam ?Vitals reviewed.  ?Constitutional:   ?   General: She is not in acute distress. ?   Appearance: Normal appearance.  ?HENT:  ?   Head: Normocephalic and atraumatic.  ?   Right  Ear: External ear normal.  ?   Left Ear: External ear normal.  ?Eyes:  ?   General: No scleral icterus.    ?   Right eye: No discharge.     ?   Left eye: No discharge.  ?   Conjunctiva/sclera: Conjunctivae normal.  ?Neck:  ?   Thyroid: No thyromegaly.  ?Cardiovascular:  ?   Rate and Rhythm: Normal rate and regular rhythm.  ?Pulmonary:  ?   Effort: No respiratory distress.  ?   Breath sounds: Normal breath sounds. No wheezing.  ?Abdominal:  ?   General: Bowel sounds are normal.  ?   Palpations: Abdomen is soft.  ?   Tenderness: There is no abdominal tenderness.  ?Musculoskeletal:     ?   General: No swelling or tenderness.  ?   Cervical back: Neck supple. No tenderness.  ?Lymphadenopathy:  ?   Cervical: No cervical adenopathy.  ?Skin: ?   Findings: No erythema or rash.  ?Neurological:  ?   Mental Status: She is alert.  ?Psychiatric:     ?   Mood and Affect: Mood normal.     ?   Behavior: Behavior normal.  ? ? ? ?Outpatient Encounter Medications as of 03/13/2022  ?Medication Sig  ? aspirin 81 MG tablet Take 81 mg by mouth daily.  ? Biotin 5000 MCG CAPS Take 1 capsule by mouth daily.  ? blood glucose meter kit and supplies KIT Dispense based on insurance preference. Use daily to check sugars once daily. Dx e11.9  ? cetirizine (ZYRTEC) 10 MG tablet Take 10 mg by mouth daily.  ? docusate sodium (COLACE) 100 MG capsule Take 100 mg by mouth daily as needed for mild constipation.  ? ferrous sulfate 325 (65 FE) MG EC tablet Take 325 mg by mouth daily with breakfast. Every other day  ? isosorbide mononitrate  (IMDUR) 30 MG 24 hr tablet Take 30 mg by mouth daily.  ? ketorolac (ACULAR) 0.5 % ophthalmic solution SMARTSIG:In Eye(s)  ? metFORMIN (GLUCOPHAGE) 500 MG tablet TAKE 1 TABLET BY MOUTH EVERY DAY WITH BREAKFAST  ? Omega-3

## 2022-03-17 ENCOUNTER — Other Ambulatory Visit: Payer: Self-pay | Admitting: Internal Medicine

## 2022-03-20 ENCOUNTER — Inpatient Hospital Stay: Payer: Medicare Other | Attending: Internal Medicine

## 2022-03-20 DIAGNOSIS — D631 Anemia in chronic kidney disease: Secondary | ICD-10-CM | POA: Diagnosis present

## 2022-03-20 DIAGNOSIS — N183 Chronic kidney disease, stage 3 unspecified: Secondary | ICD-10-CM | POA: Diagnosis present

## 2022-03-20 DIAGNOSIS — D649 Anemia, unspecified: Secondary | ICD-10-CM

## 2022-03-20 LAB — BASIC METABOLIC PANEL
Anion gap: 6 (ref 5–15)
BUN: 23 mg/dL (ref 8–23)
CO2: 24 mmol/L (ref 22–32)
Calcium: 8.9 mg/dL (ref 8.9–10.3)
Chloride: 106 mmol/L (ref 98–111)
Creatinine, Ser: 1.21 mg/dL — ABNORMAL HIGH (ref 0.44–1.00)
GFR, Estimated: 43 mL/min — ABNORMAL LOW (ref >60)
Glucose, Bld: 138 mg/dL — ABNORMAL HIGH (ref 70–99)
Potassium: 3.7 mmol/L (ref 3.5–5.1)
Sodium: 136 mmol/L (ref 135–145)

## 2022-03-20 LAB — CBC WITH DIFFERENTIAL/PLATELET
Abs Immature Granulocytes: 0.03 10*3/uL (ref 0.00–0.07)
Basophils Absolute: 0.1 10*3/uL (ref 0.0–0.1)
Basophils Relative: 1 %
Eosinophils Absolute: 0.2 10*3/uL (ref 0.0–0.5)
Eosinophils Relative: 4 %
HCT: 29.2 % — ABNORMAL LOW (ref 36.0–46.0)
Hemoglobin: 9.5 g/dL — ABNORMAL LOW (ref 12.0–15.0)
Immature Granulocytes: 1 %
Lymphocytes Relative: 20 %
Lymphs Abs: 1.2 10*3/uL (ref 0.7–4.0)
MCH: 35.4 pg — ABNORMAL HIGH (ref 26.0–34.0)
MCHC: 32.5 g/dL (ref 30.0–36.0)
MCV: 109 fL — ABNORMAL HIGH (ref 80.0–100.0)
Monocytes Absolute: 0.3 10*3/uL (ref 0.1–1.0)
Monocytes Relative: 5 %
Neutro Abs: 4.1 10*3/uL (ref 1.7–7.7)
Neutrophils Relative %: 69 %
Platelets: 152 10*3/uL (ref 150–400)
RBC: 2.68 MIL/uL — ABNORMAL LOW (ref 3.87–5.11)
RDW: 13.8 % (ref 11.5–15.5)
WBC: 5.9 10*3/uL (ref 4.0–10.5)
nRBC: 0 % (ref 0.0–0.2)

## 2022-03-20 LAB — SAMPLE TO BLOOD BANK

## 2022-03-20 MED FILL — Ferumoxytol Inj 510 MG/17ML (30 MG/ML) (Elemental Fe): INTRAVENOUS | Qty: 17 | Status: AC

## 2022-03-21 ENCOUNTER — Ambulatory Visit: Payer: Medicare Other

## 2022-03-21 ENCOUNTER — Telehealth: Payer: Self-pay | Admitting: *Deleted

## 2022-03-21 ENCOUNTER — Inpatient Hospital Stay (HOSPITAL_BASED_OUTPATIENT_CLINIC_OR_DEPARTMENT_OTHER): Payer: Medicare Other | Admitting: Internal Medicine

## 2022-03-21 ENCOUNTER — Encounter: Payer: Self-pay | Admitting: Internal Medicine

## 2022-03-21 ENCOUNTER — Ambulatory Visit: Payer: Medicare Other | Admitting: Internal Medicine

## 2022-03-21 ENCOUNTER — Inpatient Hospital Stay: Payer: Medicare Other

## 2022-03-21 DIAGNOSIS — D631 Anemia in chronic kidney disease: Secondary | ICD-10-CM | POA: Diagnosis not present

## 2022-03-21 DIAGNOSIS — N183 Chronic kidney disease, stage 3 unspecified: Secondary | ICD-10-CM | POA: Diagnosis not present

## 2022-03-21 DIAGNOSIS — D649 Anemia, unspecified: Secondary | ICD-10-CM

## 2022-03-21 MED ORDER — CYANOCOBALAMIN 1000 MCG/ML IJ SOLN
1000.0000 ug | Freq: Once | INTRAMUSCULAR | Status: AC
  Administered 2022-03-21: 1000 ug via INTRAMUSCULAR
  Filled 2022-03-21: qty 1

## 2022-03-21 MED ORDER — EPOETIN ALFA-EPBX 40000 UNIT/ML IJ SOLN
40000.0000 [IU] | Freq: Once | INTRAMUSCULAR | Status: AC
  Administered 2022-03-21: 40000 [IU] via SUBCUTANEOUS
  Filled 2022-03-21: qty 1

## 2022-03-21 NOTE — Progress Notes (Signed)
[place] ?OFFICE PROGRESS NOTE ? ?Patient Care Team: ?Einar Pheasant, MD as PCP - General (Internal Medicine) ?Einar Pheasant, MD (Internal Medicine) ?Isaias Cowman, MD (Internal Medicine) ?Brendolyn Patty, MD (Specialist) ?Schnier, Dolores Lory, MD (Vascular Surgery) ?Isaias Cowman, MD (Internal Medicine) ?Brendolyn Patty, MD (Specialist) ?Schnier, Dolores Lory, MD (Vascular Surgery) ? ? ?SUMMARY OF HEMATOLOGIC HISTORY: ? ?# IRON DEFICIENCY ANEMIA- recurrent [? GI blood loss; Dr.Elliot; AVM-capsule]; IV Ferrahem q 87m last colo- 2012/march; EGD-- [id number]  July 2020 bone marrow biopsy-mild dysplastic changes; 40% hypercellularity; cytogenetics-normal.-Retacrit.MARCH 2023- Foundation One Hem- TP53 subclonal. B12 def- FEB 2023- Start B12 injections Monthly; MAY 3rd, 2023- Start Retacrit 20K [place].  ? ?#Colonoscopy-April 2020 aborted because of cardiac pause. ? ?# CKD stage III-IV; CAD-Dr. PSaralyn Pilar  March 30th, 2022-TIA/slurred spleech [Dr.Shah] ? ?INTERVAL HISTORY: In a wheelchair.  Accompanied by her daughter RShirlean Mylar ? ? 88-year-old female patient with above history of recurrent iron deficiency anemia unclear etiology/?-related to CKD is here for follow-up. ? ?In the interim patient had cataract surgery of the right eye also.  Uneventful. ? ?Patient denies any blood in stools or black-colored stools. Patient denies any nausea vomiting. Patient received Feraheme infusion approximately 3 weeks ago.  ? ?Patient admits to improvement of her fatigue .  ? ?Review of Systems  ?Constitutional:  Positive for malaise/fatigue. Negative for chills, diaphoresis, fever and weight loss.  ?HENT:  Negative for nosebleeds and sore throat.   ?Eyes:  Negative for double vision.  ?Respiratory:  Negative for cough, hemoptysis, sputum production and wheezing.   ?Cardiovascular:  Negative for palpitations, orthopnea and leg swelling.  ?Gastrointestinal:  Negative for abdominal pain, blood in stool, constipation,  diarrhea, heartburn, melena, nausea and vomiting.  ?Genitourinary:  Negative for dysuria, frequency and urgency.  ?Musculoskeletal:  Negative for back pain and joint pain.  ?Skin: Negative.  Negative for itching and rash.  ?Neurological:  Negative for dizziness, tingling, focal weakness, weakness and headaches.  ?Endo/Heme/Allergies:  Does not bruise/bleed easily.  ?Psychiatric/Behavioral:  Negative for depression. The patient is not nervous/anxious and does not have insomnia.   ? ? ?PAST MEDICAL HISTORY :  ?Past Medical History:  ?Diagnosis Date  ?? Arthritis   ?? AV malformation of gastrointestinal tract   ?? Blood in stool   ?? CAD (coronary artery disease)   ?? [place]   ?? Cataracts, bilateral   ?? Chronic blood loss anemia   ?? Chronic cystitis   ?? CKD (chronic kidney disease), stage III ([place]   ?? Complication of anesthesia   ? reaction to propofol - caused heart to pause  ?? Depression   ?? Diabetes mellitus ([place]   ?? GERD (gastroesophageal reflux disease)   ?? Hyperlipidemia   ?? Hypertension   ?? IDA (iron deficiency anemia)   ?? Stress incontinence   ?? TIA (transient ischemic attack) 03/2021  ? No deficits  ?? Wears dentures   ? full upper and lower  ? ? ?PAST SURGICAL HISTORY :   ?Past Surgical History:  ?Procedure Laterality Date  ?? ABDOMINAL HYSTERECTOMY  11/19/1970  ? ovaries left in place  ?? APPENDECTOMY  11/19/1990  ?? BACK SURGERY  06/08/2010  ? L3, L4, L5  ?? CARDIAC CATHETERIZATION  02/2001  ?? CAROTID ARTERY ANGIOPLASTY  09/19/2002  ?? [place]  ? right then left   ?? CATARACT EXTRACTION W/PHACO Left 02/13/2022  ? Procedure: CATARACT EXTRACTION PHACO AND INTRAOCULAR LENS PLACEMENT (IOC) LEFT DIABETIC 14.90 01:26.8;  Surgeon: Birder Robson, MD;  Location: [place];  Service: Ophthalmology;  Laterality: Left;  Diabetic  ?? CATARACT EXTRACTION W/PHACO Right 03/06/2022  ? Procedure: CATARACT EXTRACTION PHACO AND INTRAOCULAR LENS  PLACEMENT (IOC) RIGHT DIABETIC 9.66 00:53.7;  Surgeon: Birder Robson, MD;  Location: [place];  Service: Ophthalmology;  Laterality: Right;  Diabetic  ?? CHOLECYSTECTOMY  11/20/1991  ?? COLONOSCOPY WITH PROPOFOL N/A 02/02/2019  ? Procedure: COLONOSCOPY WITH PROPOFOL;  Surgeon: Manya Silvas, MD;  Location: [facility] ENDOSCOPY;  Service: Endoscopy;  Laterality: N/A;  ?? ESOPHAGOGASTRODUODENOSCOPY N/A 02/02/2019  ? Procedure: ESOPHAGOGASTRODUODENOSCOPY (EGD);  Surgeon: Manya Silvas, MD;  Location: [facility] ENDOSCOPY;  Service: Endoscopy;  Laterality: N/A;  ?? FOOT SURGERY  06/20/2007  ? Left  ?? right carotid artery surgery  01/09/2013  ? Dr. Delana Meyer @ [facility]  ?? TOTAL HIP ARTHROPLASTY  05/03/2011  ? left 12, right 11/07  ? ? ?FAMILY HISTORY :   ?Family History  ?Problem Relation Age of Onset  ?? Hyperlipidemia Father   ?? Heart disease Father   ?     myocardial infarction  ?? Hypertension Father   ?     Parent  ?? Arthritis Other   ?     parent  ?? Diabetes Other   ?     nephew  ?? Cervical cancer Sister   ?? Rectal cancer Sister   ?? Breast cancer Neg Hx   ? ? ?SOCIAL HISTORY:   ?Social History  ? ?Tobacco Use  ?? Smoking status: Former  ?  Types: Cigarettes  ?  Quit date: 11/19/1989  ?  Years since quitting: 32.3  ?? Smokeless tobacco: Never  ?Vaping Use  ?? Vaping Use: Never used  ?Substance Use Topics  ?? Alcohol use: No  ?  Alcohol/week: 0.0 standard drinks  ?? Drug use: No  ? ? ?ALLERGIES:  is allergic to propofol, ciprofloxacin, levaquin [levofloxacin], and tequin [gatifloxacin]. ? ?MEDICATIONS:  ?Current Outpatient Medications  ?Medication Sig Dispense Refill  ?? aspirin 81 MG tablet Take 81 mg by mouth daily.    ?? Biotin 5000 MCG CAPS Take 1 capsule by mouth daily.    ?? blood glucose meter kit and supplies KIT Dispense based on insurance preference. Use daily to check sugars once daily. Dx e11.9 1 each 0  ?? cetirizine (ZYRTEC) 10 MG tablet Take 10 mg by mouth daily.    ?? docusate sodium  (COLACE) 100 MG capsule Take 100 mg by mouth daily as needed for mild constipation.    ?? ferrous sulfate 325 (65 FE) MG EC tablet Take 325 mg by mouth daily with breakfast. Every other day    ?? isosorbide mononitrate (IMDUR) 30 MG 24 hr tablet Take 30 mg by mouth daily.    ?? ketorolac (ACULAR) 0.5 % ophthalmic solution SMARTSIG:In Eye(s)    ?? Lancets (ONETOUCH DELICA PLUS [id number]) MISC USE AS INSTRUCTED TO CHECK BLOOD SUGARS ONCE DAILY. DX E 11.9 100 each 12  ?? metFORMIN (GLUCOPHAGE) 500 MG tablet TAKE 1 TABLET BY MOUTH EVERY DAY WITH BREAKFAST 90 tablet 1  ?? Omega-3 Fatty Acids (FISH OIL) 1200 MG CAPS Take 1 capsule by mouth daily.    ?? ONETOUCH ULTRA test strip USE AS INSTRUCTED TO CHECK BLOOD SUGARS ONCE DAILY. DX E11.9 100 strip 12  ?? prednisoLONE acetate (PRED FORTE) 1 % ophthalmic suspension SMARTSIG:In Eye(s)    ?? Probiotic, Lactobacillus, CAPS     ?? rosuvastatin (CRESTOR) 40 MG tablet TAKE 1 TABLET BY MOUTH EVERY  DAY 90 tablet 1  ?? traZODone (DESYREL) 50 MG tablet TAKE 0.5-1 TABLETS BY MOUTH AT BEDTIME AS NEEDED FOR SLEEP. 90 tablet 1  ? ?No current facility-administered medications for this visit.  ? ?Facility-Administered Medications Ordered in Other Visits  ?Medication Dose Route Frequency Provider Last Rate Last Admin  ?? cyanocobalamin ((VITAMIN B-12)) injection 1,000 mcg  1,000 mcg Intramuscular Once Cammie Sickle, MD      ?? epoetin alfa-epbx (RETACRIT) injection 40,000 Units  40,000 Units Subcutaneous Once Charlaine Dalton R, MD      ? ? ?PHYSICAL EXAMINATION: ? ? ?BP 95/78 (BP Location: Left Arm, Patient Position: Sitting, Cuff Size: Normal)   Pulse 75   Temp 98.3 ?F (36.8 ?C) (Tympanic)   Ht 5' (1.524 m)   Wt 134 lb 8 oz (61 kg)   SpO2 99%   BMI 26.27 kg/m?  ? ?Filed Weights  ? 03/21/22 1057  ?Weight: 134 lb 8 oz (61 kg)  ? ? ?Physical Exam ?Constitutional:   ?   Comments: Frail-appearing Caucasian female patient.  She is in a wheelchair.  ?HENT:  ?   Head:  Normocephalic and atraumatic.  ?   Mouth/Throat:  ?   Pharynx: No oropharyngeal exudate.  ?Eyes:  ?   Pupils: Pupils are equal, round, and reactive to light.  ?   Comments: Positive for pallor.  ?Cardiovascular:  ?   Rate and Rhy

## 2022-03-21 NOTE — Assessment & Plan Note (Addendum)
#  Severe anemia hemoglobin-nadir 6 [summer 2020]. 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  ? ?# continue Feraheme monthly.FEB 2023- sat- 14%; ferritin 30.  Proceed with retacrit today. ? ?# B12 def- FEB 2023- 232; start B12 injections.[march 2023]  ? ?# CKD stage III-1.5- GFR- 40s STABLE;  Recommend continued p.o. intake.;  See above ? ?# TIA: on plavix [Dr.Shah] on PT. ? ?# DISPOSITION:  ?# HOLD Ferrahem today; proceed with B12 injection & retacrit today ?; ?# in 4 weeks-  MD-labs-  labs-cbc;bmp; B12 levels; possible Ferrahem or possible retacrit; B12 injection- Dr.B ? ? ? ?

## 2022-03-21 NOTE — Telephone Encounter (Signed)
Daughter called and said to cancel lab appointment for 5/31 and to call her back ?

## 2022-03-24 ENCOUNTER — Encounter: Payer: Self-pay | Admitting: Internal Medicine

## 2022-03-24 NOTE — Assessment & Plan Note (Signed)
Outside sugars reviewed - averaging 100-130.  Continue low carb diet and exercise.  Follow met b and a1c.  ?

## 2022-03-24 NOTE — Assessment & Plan Note (Addendum)
Dr Delana Meyer 08/2021:  ?Duplex ultrasound shows?<50%?stenosis bilaterally. ?? ?Continue antiplatelet therapy as prescribed ?Continue management of CAD, HTN and Hyperlipidemia ?? ?Follow up in?12?months with duplex ultrasound and physical exam? ?

## 2022-03-24 NOTE — Assessment & Plan Note (Signed)
Continue crestor.  Low cholesterol diet and exercise. Follow lipid panel and liver function tests.   

## 2022-03-24 NOTE — Assessment & Plan Note (Signed)
Continue imdur and crestor.  Continue risk factor modification.  No chest pain.  Follow.  ?

## 2022-03-24 NOTE — Assessment & Plan Note (Signed)
Continue crestor 

## 2022-03-24 NOTE — Assessment & Plan Note (Signed)
Follow cbc.  Being followed by hematology.  °

## 2022-03-24 NOTE — Assessment & Plan Note (Signed)
Continues on aspirin.  Denies any increased heart rate or palpitations.  Follow.  ?

## 2022-03-24 NOTE — Assessment & Plan Note (Signed)
Continues on imdur.  Follow pressures.   ?

## 2022-03-24 NOTE — Assessment & Plan Note (Signed)
Continue to avoid antiinflammatories.  Stay hydrated.  Follow metabolic panel.  

## 2022-03-24 NOTE — Assessment & Plan Note (Signed)
Followed by hematology.  History of AVM.  Also with CKD.  Receiving IV iron.   ?

## 2022-03-27 ENCOUNTER — Ambulatory Visit (INDEPENDENT_AMBULATORY_CARE_PROVIDER_SITE_OTHER): Payer: Medicare Other

## 2022-03-27 VITALS — Ht 60.0 in | Wt 134.0 lb

## 2022-03-27 DIAGNOSIS — Z0000 Encounter for general adult medical examination without abnormal findings: Secondary | ICD-10-CM

## 2022-03-27 NOTE — Progress Notes (Addendum)
Subjective:   Jocelyn Elliott is a 88 y.o. female who presents for Medicare Annual (Subsequent) preventive examination.  Review of Systems    No ROS.  Medicare Wellness Virtual Visit.  Visual/audio telehealth visit, UTA vital signs.   See social history for additional risk factors.   Cardiac Risk Factors include: advanced age (>35men, >76 women);diabetes mellitus;hypertension     Objective:    Today's Vitals   03/27/22 0934  Weight: 134 lb (60.8 kg)  Height: 5' (1.524 m)   Body mass index is 26.17 kg/m.     03/27/2022   10:35 AM 03/21/2022   10:57 AM 03/06/2022    8:10 AM 02/28/2022    9:27 AM 02/13/2022    6:48 AM 01/30/2022    8:56 AM 01/08/2022    1:19 PM  Advanced Directives  Does Patient Have a Medical Advance Directive? Yes Yes Yes Yes Yes Yes Yes  Type of Estate agent of [place];Living will Healthcare Power of [place];Living will Healthcare Power of [place];Living will Healthcare Power of [place];Living will Healthcare Power of [place];Living will Healthcare Power of [place];Living will Living will  Does patient want to make changes to medical advance directive? No - Patient declined  No - Patient declined  No - Patient declined    Copy of Healthcare Power of Attorney in Chart? No - copy requested  No - copy requested  Yes - validated most recent copy scanned in chart (See row information)      Current Medications (verified) Outpatient Encounter Medications as of 03/27/2022  Medication Sig   aspirin 81 MG tablet Take 81 mg by mouth daily.   Biotin 5000 MCG CAPS Take 1 capsule by mouth daily.   blood glucose meter kit and supplies KIT Dispense based on insurance preference. Use daily to check sugars once daily. Dx e11.9   cetirizine (ZYRTEC) 10 MG tablet Take 10 mg by mouth daily.   docusate sodium (COLACE) 100 MG capsule Take 100 mg by mouth daily as needed for mild constipation.   ferrous sulfate 325 (65 FE) MG EC tablet Take 325 mg by mouth  daily with breakfast. Every other day   isosorbide mononitrate (IMDUR) 30 MG 24 hr tablet Take 30 mg by mouth daily.   ketorolac (ACULAR) 0.5 % ophthalmic solution SMARTSIG:In Eye(s)   Lancets (ONETOUCH DELICA PLUS LANCET30G) MISC USE AS INSTRUCTED TO CHECK BLOOD SUGARS ONCE DAILY. DX E 11.9   metFORMIN (GLUCOPHAGE) 500 MG tablet TAKE 1 TABLET BY MOUTH EVERY DAY WITH BREAKFAST   Omega-3 Fatty Acids (FISH OIL) 1200 MG CAPS Take 1 capsule by mouth daily.   ONETOUCH ULTRA test strip USE AS INSTRUCTED TO CHECK BLOOD SUGARS ONCE DAILY. DX E11.9   prednisoLONE acetate (PRED FORTE) 1 % ophthalmic suspension SMARTSIG:In Eye(s)   Probiotic, Lactobacillus, CAPS    rosuvastatin (CRESTOR) 40 MG tablet TAKE 1 TABLET BY MOUTH EVERY DAY   traZODone (DESYREL) 50 MG tablet TAKE 0.5-1 TABLETS BY MOUTH AT BEDTIME AS NEEDED FOR SLEEP.   No facility-administered encounter medications on file as of 03/27/2022.    Allergies (verified) Propofol, Ciprofloxacin, Levaquin [levofloxacin], and Tequin [gatifloxacin]   History: Past Medical History:  Diagnosis Date   Arthritis    AV malformation of gastrointestinal tract    Blood in stool    CAD (coronary artery disease)    Carotid arterial disease (HCC)    Cataracts, bilateral    Chronic blood loss anemia    Chronic cystitis    CKD (chronic  kidney disease), stage III (HCC)    Complication of anesthesia    reaction to propofol - caused heart to pause   Depression    Diabetes mellitus (HCC)    GERD (gastroesophageal reflux disease)    Hyperlipidemia    Hypertension    IDA (iron deficiency anemia)    Stress incontinence    TIA (transient ischemic attack) 03/2021   No deficits   Wears dentures    full upper and lower   Past Surgical History:  Procedure Laterality Date   ABDOMINAL HYSTERECTOMY  11/19/1970   ovaries left in place   APPENDECTOMY  11/19/1990   BACK SURGERY  06/08/2010   L3, L4, L5   CARDIAC CATHETERIZATION  02/2001   CAROTID ARTERY  ANGIOPLASTY  09/19/2002   CARPAL TUNNEL RELEASE  1991, 92   right then left    CATARACT EXTRACTION W/PHACO Left 02/13/2022   Procedure: CATARACT EXTRACTION PHACO AND INTRAOCULAR LENS PLACEMENT (IOC) LEFT DIABETIC 14.90 01:26.8;  Surgeon: Galen Manila, MD;  Location: [facility];  Service: Ophthalmology;  Laterality: Left;  Diabetic   CATARACT EXTRACTION W/PHACO Right 03/06/2022   Procedure: CATARACT EXTRACTION PHACO AND INTRAOCULAR LENS PLACEMENT (IOC) RIGHT DIABETIC 9.66 00:53.7;  Surgeon: Galen Manila, MD;  Location: [facility];  Service: Ophthalmology;  Laterality: Right;  Diabetic   CHOLECYSTECTOMY  11/20/1991   COLONOSCOPY WITH PROPOFOL N/A 02/02/2019   Procedure: COLONOSCOPY WITH PROPOFOL;  Surgeon: Scot Jun, MD;  Location: [facility];  Service: Endoscopy;  Laterality: N/A;   ESOPHAGOGASTRODUODENOSCOPY N/A 02/02/2019   Procedure: ESOPHAGOGASTRODUODENOSCOPY (EGD);  Surgeon: Scot Jun, MD;  Location[facility];  Service: Endoscopy;  Laterality: N/A;   FOOT SURGERY  06/20/2007   Left   right carotid artery surgery  01/09/2013   Dr. Gilda Crease @ [facility]   TOTAL HIP ARTHROPLASTY  05/03/2011   left 12, right 11/07   Family History  Problem Relation Age of Onset   Hyperlipidemia Father    Heart disease Father        myocardial infarction   Hypertension Father        Parent   Arthritis Other        parent   Diabetes Other        nephew   Cervical cancer Sister    Rectal cancer Sister    Breast cancer Neg Hx    Social History   Socioeconomic History   Marital status: Widowed    Spouse name: Not on file   Number of children: 1   Years of education: 2   Highest education level: Not on file  Occupational History   Occupation: Retired  Tobacco Use   Smoking status: Former    Types: Cigarettes    Quit date: 11/19/1989    Years since quitting: 32.3   Smokeless tobacco: Never  Vaping Use   Vaping Use: Never used  Substance and  Sexual Activity   Alcohol use: No    Alcohol/week: 0.0 standard drinks   Drug use: No   Sexual activity: Not Currently  Other Topics Concern   Not on file  Social History Narrative   Regular exercise-no   Caffeine Use-yes   Social Determinants of Health   Financial Resource Strain: Low Risk    Difficulty of Paying Living Expenses: Not hard at all  Food Insecurity: No Food Insecurity   Worried About Programme researcher, broadcasting/film/video in the Last Year: Never true   Ran Out of Food in the Last Year: Never  true  Transportation Needs: No Transportation Needs   Lack of Transportation (Medical): No   Lack of Transportation (Non-Medical): No  Physical Activity: Not on file  Stress: No Stress Concern Present   Feeling of Stress : Not at all  Social Connections: Unknown   Frequency of Communication with Friends and Family: More than three times a week   Frequency of Social Gatherings with Friends and Family: Three times a week   Attends Religious Services: Not on file   Active Member of Clubs or Organizations: Not on file   Attends Club or Organization Meetings: Not on file   Marital Status: Widowed    Tobacco Counseling Counseling given: Not Answered   Clinical Intake:  Pre-visit preparation completed: Yes        Diabetes: Yes (Followed PCP)  How often do you need to have someone help you when you read instructions, pamphlets, or other written materials from your doctor or pharmacy?: 1 - Never  Interpreter Needed?: No      Activities of Daily Living    03/27/2022    9:34 AM 03/06/2022    7:59 AM  In your present state of health, do you have any difficulty performing the following activities:  Hearing? 0 0  Vision? 0 0  Difficulty concentrating or making decisions? 0 0  Walking or climbing stairs? 1 0  Comment Cane/walker in use when ambulating.   Dressing or bathing? 0 0  Doing errands, shopping? 1   Comment Daughter Advice worker and eating ? N   Comment Daughter  meal preps. Self feeds.   Using the Toilet? N   In the past six months, have you accidently leaked urine? Y   Comment Stress incontinence. Managed with daily liner.   Do you have problems with loss of bowel control? N   Managing your Medications? N   Managing your Finances? N   Housekeeping or managing your Housekeeping? Y   Comment Daughter maid assist     Patient Care Team: Dale Wrightwood, MD as PCP - General (Internal Medicine) Dale Rockwood, MD (Internal Medicine) Marcina Millard, MD (Internal Medicine) Willeen Niece, MD (Specialist) Schnier, Latina Craver, MD (Vascular Surgery) Marcina Millard, MD (Internal Medicine) Willeen Niece, MD (Specialist) Gilda Crease, Latina Craver, MD (Vascular Surgery)  Indicate any recent Medical Services you may have received from other than [facility] providers in the past year (date may be approximate).     Assessment:   This is a routine wellness examination for [place].  Virtual Visit via Telephone Note  I connected with  Jocelyn Elliott on 03/27/22 at  9:30 AM EDT by telephone and verified that I am speaking with the correct person using two identifiers.  Persons participating in the virtual visit: patient/Nurse Health Advisor   I discussed the limitations of performing an evaluation and management service by telehealth. The patient expressed understanding and agreed to proceed.We continued and completed visit with audio only. Some vital signs may be absent or patient reported.   Hearing/Vision screen Hearing Screening - Comments:: Patient is able to hear conversational tones without difficulty. No issues reported.  Vision Screening - Comments:: Followed by [facility] Cataracts extracted, bilateral 02/13/22 L eye, 03/06/22 R eye Wears reading glasses  No retinopathy reported  Dietary issues and exercise activities discussed: Current Exercise Habits: Home exercise routine, Type of exercise: walking (Standing exercises/leg lifts),  Intensity: Mild  FBS 103 Healthy diet Fair water intake   Goals Addressed  This Visit's Progress    Follow up with Primary Care Provider       As needed.       Depression Screen    03/27/2022    9:44 AM 09/15/2021    4:21 PM 04/11/2021    1:20 PM 03/24/2021   10:47 AM 02/07/2021   11:25 AM 02/04/2020   11:53 AM 11/08/2016   11:22 AM  PHQ 2/9 Scores  PHQ - 2 Score 0 0 0 0 1 0 0    Fall Risk    03/27/2022    9:44 AM 09/15/2021    4:21 PM 04/11/2021    1:20 PM 03/24/2021   10:49 AM 02/04/2020   11:53 AM  Fall Risk   Falls in the past year? 0 1 1 0 0  Number falls in past yr: 0 0 1 0   Injury with Fall?  0 0 0   Risk for fall due to :  History of fall(s)   Impaired balance/gait  Follow up Falls evaluation completed Falls evaluation completed Falls evaluation completed Falls evaluation completed Falls prevention discussed    FALL RISK PREVENTION PERTAINING TO THE HOME: Home free of loose throw rugs in walkways, pet beds, electrical cords, etc? Yes  Adequate lighting in your home to reduce risk of falls? Yes   ASSISTIVE DEVICES UTILIZED TO PREVENT FALLS: Life alert? No  Use of a cane, walker or w/c? Yes  Grab bars in the bathroom? Yes  Elevated toilet seat or a handicapped toilet? Yes   TIMED UP AND GO: Was the test performed? No .   Cognitive Function: Patient is alert and oriented x3.         03/27/2022   10:36 AM 03/24/2021   11:04 AM 02/04/2020   12:04 PM 11/08/2016   11:53 AM  6CIT Screen  What Year? 0 points 0 points 0 points 0 points  What month? 0 points 0 points 0 points 0 points  What time? 0 points 0 points 0 points 0 points  Count back from 20  0 points 0 points 0 points  Months in reverse 0 points 0 points 0 points 0 points    Immunizations Immunization History  Administered Date(s) Administered   Fluad Quad(high Dose 65+) 07/28/2019, 10/03/2020   Influenza, High Dose Seasonal PF 11/08/2016, 09/26/2017, 10/14/2018    Influenza,inj,Quad PF,6+ Mos 10/07/2013, 07/18/2015   PFIZER(Purple Top)SARS-COV-2 Vaccination 01/15/2020, 02/10/2020, 11/01/2020   Pneumococcal Conjugate-13 09/26/2017   Pneumococcal Polysaccharide-23 09/24/2007   Tdap vaccine- discontinued per patient.  Screening Tests Health Maintenance  Topic Date Due   MAMMOGRAM  09/15/2020   URINE MICROALBUMIN  02/24/2021   HEMOGLOBIN A1C  08/10/2021   COVID-19 Vaccine (4 - Booster for Pfizer series) 03/29/2022 (Originally 12/27/2020)   Zoster Vaccines- Shingrix (1 of 2) 06/12/2022 (Originally 06/12/1984)   INFLUENZA VACCINE  06/19/2022   OPHTHALMOLOGY EXAM  01/03/2023   FOOT EXAM  03/14/2023   Pneumonia Vaccine 6+ Years old  Completed   HPV VACCINES  Aged Out   DEXA SCAN  Discontinued   TETANUS/TDAP  Discontinued   Health Maintenance Health Maintenance Due  Topic Date Due   MAMMOGRAM  09/15/2020   URINE MICROALBUMIN  02/24/2021   HEMOGLOBIN A1C  08/10/2021   Mammogram- scheduled 07/16/22.  Bone density- discontinued per patient.   Lung Cancer Screening: (Low Dose CT Chest recommended if Age 63-80 years, 30 pack-year currently smoking OR have quit w/in 15years.) does not qualify.   Hepatitis C Screening: does not qualify.  Vision Screening: Recommended annual ophthalmology exams for early detection of glaucoma and other disorders of the eye.  Dental Screening: Recommended annual dental exams for proper oral hygiene  Community Resource Referral / Chronic Care Management: CRR required this visit?  No   CCM required this visit?  No      Plan:   Keep all routine maintenance appointments.   I have personally reviewed and noted the following in the patient's chart:   Medical and social history Use of alcohol, tobacco or illicit drugs  Current medications and supplements including opioid prescriptions.  Functional ability and status Nutritional status Physical activity Advanced directives List of other  physicians Hospitalizations, surgeries, and ER visits in previous 12 months Vitals Screenings to include cognitive, depression, and falls Referrals and appointments  In addition, I have reviewed and discussed with patient certain preventive protocols, quality metrics, and best practice recommendations. A written personalized care plan for preventive services as well as general preventive health recommendations were provided to patient.     Ashok Pall, LPN   11/24/1094

## 2022-03-27 NOTE — Patient Instructions (Addendum)
?  Jocelyn Elliott , ?Thank you for taking time to come for your Medicare Wellness Visit. I appreciate your ongoing commitment to your health goals. Please review the following plan we discussed and let me know if I can assist you in the future.  ? ?These are the goals we discussed: ? Goals   ? ?  Follow up with Primary Care Provider   ?  As needed. ?  ? ?  ?  ?This is a list of the screening recommended for you and due dates:  ?Health Maintenance  ?Topic Date Due  ? Mammogram  09/15/2020  ? Urine Protein Check  02/24/2021  ? Hemoglobin A1C  08/10/2021  ? COVID-19 Vaccine (4 - Booster for Pfizer series) 03/29/2022*  ? Zoster (Shingles) Vaccine (1 of 2) 06/12/2022*  ? Flu Shot  06/19/2022  ? Eye exam for diabetics  01/03/2023  ? Complete foot exam   03/14/2023  ? Pneumonia Vaccine  Completed  ? HPV Vaccine  Aged Out  ? DEXA scan (bone density measurement)  Discontinued  ? Tetanus Vaccine  Discontinued  ?*Topic was postponed. The date shown is not the original due date.  ?  ?

## 2022-04-18 ENCOUNTER — Other Ambulatory Visit: Payer: Medicare Other

## 2022-04-20 ENCOUNTER — Ambulatory Visit: Payer: Medicare Other

## 2022-04-20 ENCOUNTER — Ambulatory Visit: Payer: Medicare Other | Admitting: Oncology

## 2022-04-23 ENCOUNTER — Inpatient Hospital Stay: Payer: Medicare Other | Attending: Internal Medicine | Admitting: Internal Medicine

## 2022-04-23 ENCOUNTER — Inpatient Hospital Stay: Payer: Medicare Other

## 2022-04-23 ENCOUNTER — Encounter: Payer: Self-pay | Admitting: Internal Medicine

## 2022-04-23 DIAGNOSIS — D508 Other iron deficiency anemias: Secondary | ICD-10-CM

## 2022-04-23 DIAGNOSIS — D631 Anemia in chronic kidney disease: Secondary | ICD-10-CM | POA: Diagnosis present

## 2022-04-23 DIAGNOSIS — N183 Chronic kidney disease, stage 3 unspecified: Secondary | ICD-10-CM | POA: Diagnosis not present

## 2022-04-23 DIAGNOSIS — N184 Chronic kidney disease, stage 4 (severe): Secondary | ICD-10-CM | POA: Diagnosis present

## 2022-04-23 DIAGNOSIS — D649 Anemia, unspecified: Secondary | ICD-10-CM

## 2022-04-23 LAB — CBC WITH DIFFERENTIAL/PLATELET
Abs Immature Granulocytes: 0.02 10*3/uL (ref 0.00–0.07)
Basophils Absolute: 0.1 10*3/uL (ref 0.0–0.1)
Basophils Relative: 1 %
Eosinophils Absolute: 0.4 10*3/uL (ref 0.0–0.5)
Eosinophils Relative: 5 %
HCT: 28.1 % — ABNORMAL LOW (ref 36.0–46.0)
Hemoglobin: 8.9 g/dL — ABNORMAL LOW (ref 12.0–15.0)
Immature Granulocytes: 0 %
Lymphocytes Relative: 18 %
Lymphs Abs: 1.3 10*3/uL (ref 0.7–4.0)
MCH: 34.4 pg — ABNORMAL HIGH (ref 26.0–34.0)
MCHC: 31.7 g/dL (ref 30.0–36.0)
MCV: 108.5 fL — ABNORMAL HIGH (ref 80.0–100.0)
Monocytes Absolute: 0.4 10*3/uL (ref 0.1–1.0)
Monocytes Relative: 6 %
Neutro Abs: 5 10*3/uL (ref 1.7–7.7)
Neutrophils Relative %: 70 %
Platelets: 170 10*3/uL (ref 150–400)
RBC: 2.59 MIL/uL — ABNORMAL LOW (ref 3.87–5.11)
RDW: 13.2 % (ref 11.5–15.5)
WBC: 7.2 10*3/uL (ref 4.0–10.5)
nRBC: 0 % (ref 0.0–0.2)

## 2022-04-23 LAB — BASIC METABOLIC PANEL
Anion gap: 7 (ref 5–15)
BUN: 21 mg/dL (ref 8–23)
CO2: 23 mmol/L (ref 22–32)
Calcium: 8.7 mg/dL — ABNORMAL LOW (ref 8.9–10.3)
Chloride: 108 mmol/L (ref 98–111)
Creatinine, Ser: 1.27 mg/dL — ABNORMAL HIGH (ref 0.44–1.00)
GFR, Estimated: 41 mL/min — ABNORMAL LOW (ref >60)
Glucose, Bld: 107 mg/dL — ABNORMAL HIGH (ref 70–99)
Potassium: 4.2 mmol/L (ref 3.5–5.1)
Sodium: 138 mmol/L (ref 135–145)

## 2022-04-23 LAB — VITAMIN B12: Vitamin B-12: 482 pg/mL (ref 180–914)

## 2022-04-23 MED ORDER — CYANOCOBALAMIN 1000 MCG/ML IJ SOLN
1000.0000 ug | Freq: Once | INTRAMUSCULAR | Status: AC
  Administered 2022-04-23: 1000 ug via INTRAMUSCULAR
  Filled 2022-04-23: qty 1

## 2022-04-23 MED ORDER — SODIUM CHLORIDE 0.9 % IV SOLN
Freq: Once | INTRAVENOUS | Status: DC
  Filled 2022-04-23: qty 250

## 2022-04-23 MED ORDER — SODIUM CHLORIDE 0.9 % IV SOLN
510.0000 mg | Freq: Once | INTRAVENOUS | Status: AC
  Administered 2022-04-23: 510 mg via INTRAVENOUS
  Filled 2022-04-23: qty 510

## 2022-04-23 NOTE — Progress Notes (Signed)
Patient here for oncology follow-up appointment,  concerns of fatigue     °

## 2022-04-23 NOTE — Progress Notes (Signed)
[place] OFFICE PROGRESS NOTE  Patient Care Team: Einar Pheasant, MD as PCP - General (Internal Medicine) Einar Pheasant, MD (Internal Medicine) Isaias Cowman, MD (Internal Medicine) Brendolyn Patty, MD (Specialist) Schnier, Dolores Lory, MD (Vascular Surgery) Isaias Cowman, MD (Internal Medicine) Brendolyn Patty, MD (Specialist) Schnier, Dolores Lory, MD (Vascular Surgery) Cammie Sickle, MD as Consulting Physician (Oncology)   SUMMARY OF HEMATOLOGIC HISTORY:  # IRON DEFICIENCY ANEMIA- recurrent [? GI blood loss; Dr.Elliot; AVM-capsule]; IV Ferrahem q 56m last colo- 2012/march; EGD-- [id number]  July 2020 bone marrow biopsy-mild dysplastic changes; 40% hypercellularity; cytogenetics-normal.-Retacrit.MARCH 2023- Foundation One Hem- TP53 subclonal. B12 def- FEB 2023- Start B12 injections Monthly; MAY 3rd, 2023- Start Retacrit 20K [place].   #Colonoscopy-April 2020 aborted because of cardiac pause.  # CKD stage III-IV; CAD-Dr. PSaralyn Pilar  March 30th, 2022-TIA/slurred spleech [Dr.Shah]  INTERVAL HISTORY: In a wheelchair.  Accompanied by her daughter RShirlean Mylar   89-year-old female patient with above history of recurrent iron deficiency anemia unclear etiology/?-related to CKD is here for follow-up.  In the interim patient had cataract surgery of the right eye also.  Uneventful.  Patient denies any blood in stools or black-colored stools. Patient denies any nausea vomiting. Patient received Feraheme infusion approximately 3 weeks ago.   Patient admits to improvement of her fatigue .   Review of Systems  Constitutional:  Positive for malaise/fatigue. Negative for chills, diaphoresis, fever and weight loss.  HENT:  Negative for nosebleeds and sore throat.   Eyes:  Negative for double vision.  Respiratory:  Negative for cough, hemoptysis, sputum production and wheezing.   Cardiovascular:  Negative for palpitations, orthopnea and leg swelling.   Gastrointestinal:  Negative for abdominal pain, blood in stool, constipation, diarrhea, heartburn, melena, nausea and vomiting.  Genitourinary:  Negative for dysuria, frequency and urgency.  Musculoskeletal:  Negative for back pain and joint pain.  Skin: Negative.  Negative for itching and rash.  Neurological:  Negative for dizziness, tingling, focal weakness, weakness and headaches.  Endo/Heme/Allergies:  Does not bruise/bleed easily.  Psychiatric/Behavioral:  Negative for depression. The patient is not nervous/anxious and does not have insomnia.     PAST MEDICAL HISTORY :  Past Medical History:  Diagnosis Date   Arthritis    AV malformation of gastrointestinal tract    Blood in stool    CAD (coronary artery disease)    Carotid arterial disease (HCC)    Cataracts, bilateral    Chronic blood loss anemia    Chronic cystitis    CKD (chronic kidney disease), stage III (HCC)    Complication of anesthesia    reaction to propofol - caused heart to pause   Depression    Diabetes mellitus (HCC)    GERD (gastroesophageal reflux disease)    Hyperlipidemia    Hypertension    IDA (iron deficiency anemia)    Stress incontinence    TIA (transient ischemic attack) 03/2021   No deficits   Wears dentures    full upper and lower    PAST SURGICAL HISTORY :   Past Surgical History:  Procedure Laterality Date   ABDOMINAL HYSTERECTOMY  11/19/1970   ovaries left in place   APPENDECTOMY  11/19/1990   BACK SURGERY  06/08/2010   L3, L4, L5   CARDIAC CATHETERIZATION  02/2001   CAROTID ARTERY ANGIOPLASTY  09/19/2002   CARPAL TUNNEL RELEASE  1991, 92   right then left    CATARACT EXTRACTION W/PHACO Left 02/13/2022   Procedure: CATARACT EXTRACTION PHACO AND INTRAOCULAR  LENS PLACEMENT (IOC) LEFT DIABETIC 14.90 01:26.8;  Surgeon: Birder Robson, MD;  Location: [facility];  Service: Ophthalmology;  Laterality: Left;  Diabetic   CATARACT EXTRACTION W/PHACO Right 03/06/2022   Procedure:  CATARACT EXTRACTION PHACO AND INTRAOCULAR LENS PLACEMENT (IOC) RIGHT DIABETIC 9.66 00:53.7;  Surgeon: Birder Robson, MD;  Location: [facility];  Service: Ophthalmology;  Laterality: Right;  Diabetic   CHOLECYSTECTOMY  11/20/1991   COLONOSCOPY WITH PROPOFOL N/A 02/02/2019   Procedure: COLONOSCOPY WITH PROPOFOL;  Surgeon: Manya Silvas, MD;  Location[facility] ENDOSCOPY;  Service: Endoscopy;  Laterality: N/A;   ESOPHAGOGASTRODUODENOSCOPY N/A 02/02/2019   Procedure: ESOPHAGOGASTRODUODENOSCOPY (EGD);  Surgeon: Manya Silvas, MD;  Location: [facility] ENDOSCOPY;  Service: Endoscopy;  Laterality: N/A;   FOOT SURGERY  06/20/2007   Left   right carotid artery surgery  01/09/2013   Dr. Delana Meyer @ [facility]   TOTAL HIP ARTHROPLASTY  05/03/2011   left 12, right 11/07    FAMILY HISTORY :   Family History  Problem Relation Age of Onset   Hyperlipidemia Father    Heart disease Father        myocardial infarction   Hypertension Father        Parent   Arthritis Other        parent   Diabetes Other        nephew   Cervical cancer Sister    Rectal cancer Sister    Breast cancer Neg Hx     SOCIAL HISTORY:   Social History   Tobacco Use   Smoking status: Former    Types: Cigarettes    Quit date: 11/19/1989    Years since quitting: 32.4   Smokeless tobacco: Never  Vaping Use   Vaping Use: Never used  Substance Use Topics   Alcohol use: No    Alcohol/week: 0.0 standard drinks   Drug use: No    ALLERGIES:  is allergic to propofol, ciprofloxacin, levaquin [levofloxacin], and tequin [gatifloxacin].  MEDICATIONS:  Current Outpatient Medications  Medication Sig Dispense Refill   aspirin 81 MG tablet Take 81 mg by mouth daily.     Biotin 5000 MCG CAPS Take 1 capsule by mouth daily.     blood glucose meter kit and supplies KIT Dispense based on insurance preference. Use daily to check sugars once daily. Dx e11.9 1 each 0   cetirizine (ZYRTEC) 10 MG tablet Take 10 mg by mouth daily.      docusate sodium (COLACE) 100 MG capsule Take 100 mg by mouth daily as needed for mild constipation.     ferrous sulfate 325 (65 FE) MG EC tablet Take 325 mg by mouth daily with breakfast. Every other day     isosorbide mononitrate (IMDUR) 30 MG 24 hr tablet Take 30 mg by mouth daily.     Lancets (ONETOUCH DELICA PLUS [id number]) MISC USE AS INSTRUCTED TO CHECK BLOOD SUGARS ONCE DAILY. DX E 11.9 100 each 12   metFORMIN (GLUCOPHAGE) 500 MG tablet TAKE 1 TABLET BY MOUTH EVERY DAY WITH BREAKFAST 90 tablet 1   Omega-3 Fatty Acids (FISH OIL) 1200 MG CAPS Take 1 capsule by mouth daily.     ONETOUCH ULTRA test strip USE AS INSTRUCTED TO CHECK BLOOD SUGARS ONCE DAILY. DX E11.9 100 strip 12   Probiotic, Lactobacillus, CAPS      rosuvastatin (CRESTOR) 40 MG tablet TAKE 1 TABLET BY MOUTH EVERY DAY 90 tablet 1   traZODone (DESYREL) 50 MG tablet TAKE 0.5-1 TABLETS BY MOUTH AT BEDTIME  AS NEEDED FOR SLEEP. 90 tablet 1   ketorolac (ACULAR) 0.5 % ophthalmic solution SMARTSIG:In Eye(s) (Patient not taking: Reported on 04/23/2022)     prednisoLONE acetate (PRED FORTE) 1 % ophthalmic suspension SMARTSIG:In Eye(s) (Patient not taking: Reported on 04/23/2022)     No current facility-administered medications for this visit.    PHYSICAL EXAMINATION:   BP (!) 93/51 (Patient Position: Sitting)   Pulse 78   Temp 98 F (36.7 C) (Tympanic)   Resp 17   Wt 137 lb (62.1 kg)   SpO2 98%   BMI 26.76 kg/m   Filed Weights   04/23/22 1317  Weight: 137 lb (62.1 kg)    Physical Exam Constitutional:      Comments: Frail-appearing Caucasian female patient.  She is in a wheelchair.  HENT:     Head: Normocephalic and atraumatic.     Mouth/Throat:     Pharynx: No oropharyngeal exudate.  Eyes:     Pupils: Pupils are equal, round, and reactive to light.     Comments: Positive for pallor.  Cardiovascular:     Rate and Rhythm: Normal rate and regular rhythm.  Pulmonary:     Effort: No respiratory distress.     Breath  sounds: No wheezing.  Abdominal:     General: Bowel sounds are normal. There is no distension.     Palpations: Abdomen is soft. There is no mass.     Tenderness: There is no abdominal tenderness. There is no guarding or rebound.  Musculoskeletal:        General: No tenderness. Normal range of motion.     Cervical back: Normal range of motion and neck supple.  Skin:    General: Skin is warm.  Neurological:     Mental Status: She is alert and oriented to person, place, and time.  Psychiatric:        Mood and Affect: Affect normal.    LABORATORY DATA:  I have reviewed the data as listed    Component Value Date/Time   NA 138 04/23/2022 1255   NA 143 01/10/2013 0314   K 4.2 04/23/2022 1255   K 4.0 01/10/2013 0314   CL 108 04/23/2022 1255   CL 109 (H) 01/10/2013 0314   CO2 23 04/23/2022 1255   CO2 27 01/10/2013 0314   GLUCOSE 107 (H) 04/23/2022 1255   GLUCOSE 119 (H) 01/10/2013 0314   BUN 21 04/23/2022 1255   BUN 15 01/10/2013 0314   CREATININE 1.27 (H) 04/23/2022 1255   CREATININE 1.03 01/10/2013 0314   CREATININE 1.13 (H) 09/18/2012 0848   CALCIUM 8.7 (L) 04/23/2022 1255   CALCIUM 8.1 (L) 01/10/2013 0314   PROT 6.3 02/07/2021 1121   PROT 7.2 12/20/2011 1521   ALBUMIN 4.0 02/07/2021 1121   ALBUMIN 3.9 12/20/2011 1521   AST 29 02/07/2021 1121   AST 31 12/20/2011 1521   ALT 29 02/07/2021 1121   ALT 37 12/20/2011 1521   ALKPHOS 55 02/07/2021 1121   ALKPHOS 57 12/20/2011 1521   BILITOT 0.4 02/07/2021 1121   BILITOT 0.5 12/20/2011 1521   GFRNONAA 41 (L) 04/23/2022 1255   GFRNONAA 52 (L) 01/10/2013 0314   GFRNONAA 47 (L) 09/18/2012 0848   GFRAA 46 (L) 07/12/2020 1240   GFRAA >60 01/10/2013 0314   GFRAA 54 (L) 09/18/2012 0848    No results found for: SPEP, UPEP  Lab Results  Component Value Date   WBC 7.2 04/23/2022   NEUTROABS 5.0 04/23/2022   HGB 8.9 (L) 04/23/2022  HCT 28.1 (L) 04/23/2022   MCV 108.5 (H) 04/23/2022   PLT 170 04/23/2022      Chemistry       Component Value Date/Time   NA 138 04/23/2022 1255   NA 143 01/10/2013 0314   K 4.2 04/23/2022 1255   K 4.0 01/10/2013 0314   CL 108 04/23/2022 1255   CL 109 (H) 01/10/2013 0314   CO2 23 04/23/2022 1255   CO2 27 01/10/2013 0314   BUN 21 04/23/2022 1255   BUN 15 01/10/2013 0314   CREATININE 1.27 (H) 04/23/2022 1255   CREATININE 1.03 01/10/2013 0314   CREATININE 1.13 (H) 09/18/2012 0848      Component Value Date/Time   CALCIUM 8.7 (L) 04/23/2022 1255   CALCIUM 8.1 (L) 01/10/2013 0314   ALKPHOS 55 02/07/2021 1121   ALKPHOS 57 12/20/2011 1521   AST 29 02/07/2021 1121   AST 31 12/20/2011 1521   ALT 29 02/07/2021 1121   ALT 37 12/20/2011 1521   BILITOT 0.4 02/07/2021 1121   BILITOT 0.5 12/20/2011 1521         ASSESSMENT & PLAN:   Anemia of chronic kidney failure, stage 3 (moderate) (HCC) #Severe anemia hemoglobin-nadir 6 [summer 2020]. Likely CKD/iron deficiency [July 2021- Bone marrow biopsy-mild dyserythropoietic changes]. [place].   # continue Feraheme monthly. FEB 2023- sat- 14%; ferritin 30.  Proceed with retacrit today.  Plan Retacrit later this week.  Today hemoglobin is 8.9.  # B12 def- FEB 2023- 232; start B12 injections every monthly.Luetta Nutting 2023]   # CKD stage III-1.5- GFR- 40s STABLE;  Recommend continued p.o. intake.;  See above  # TIA: on plavix [Dr.Shah] on PT.  # DISPOSITION:  # Ferrahem today; proceed with B12 injection; HOLD  retacrit today  # 6/09-retacrit  Lab-H&H; reticulocyte count; LDH; Haptoglobin; DAT; tech review of smear re: hemolysis [please order]  # in 4 weeks-  MD-labs-  labs-cbc;bmp; B12 levels; possible Ferrahem or possible retacrit; B12 injection- Dr.B      Cammie Sickle, MD 04/23/2022 10:20 PM

## 2022-04-23 NOTE — Assessment & Plan Note (Addendum)
#  Severe anemia hemoglobin-nadir 6 [summer 2020]. Likely CKD/iron deficiency [July 2021- Bone marrow biopsy-mild dyserythropoietic changes]. [place].   # continue Feraheme monthly. FEB 2023- sat- 14%; ferritin 30.  Proceed with retacrit today.  Plan Retacrit later this week.  Today hemoglobin is 8.9.  Given the macrocytosis will rule out hemolysis-ordered haptoglobin LDH reticulocyte count etc.  # B12 def- FEB 2023- 232; start B12 injections every monthly.Jocelyn Elliott 2023]   # CKD stage III-1.5- GFR- 40s STABLE;  Recommend continued p.o. intake.;  See above  # TIA: on plavix [Dr.Shah] on PT.  # DISPOSITION:  # Ferrahem today; proceed with B12 injection; HOLD  retacrit today  # 6/09-retacrit  Lab-H&H; reticulocyte count; LDH; Haptoglobin; DAT; tech review of smear re: hemolysis [please order]  # in 4 weeks-  MD-labs-  labs-cbc;bmp; B12 levels; possible Ferrahem or possible retacrit; B12 injection- Dr.B

## 2022-04-27 ENCOUNTER — Inpatient Hospital Stay: Payer: Medicare Other

## 2022-04-27 ENCOUNTER — Other Ambulatory Visit: Payer: Self-pay

## 2022-04-27 VITALS — BP 107/59 | HR 70

## 2022-04-27 DIAGNOSIS — D649 Anemia, unspecified: Secondary | ICD-10-CM

## 2022-04-27 DIAGNOSIS — N183 Chronic kidney disease, stage 3 unspecified: Secondary | ICD-10-CM

## 2022-04-27 DIAGNOSIS — N184 Chronic kidney disease, stage 4 (severe): Secondary | ICD-10-CM | POA: Diagnosis not present

## 2022-04-27 LAB — RETIC PANEL
Immature Retic Fract: 29.6 % — ABNORMAL HIGH (ref 2.3–15.9)
RBC.: 2.69 MIL/uL — ABNORMAL LOW (ref 3.87–5.11)
Retic Count, Absolute: 134.8 10*3/uL (ref 19.0–186.0)
Retic Ct Pct: 5 % — ABNORMAL HIGH (ref 0.4–3.1)
Reticulocyte Hemoglobin: 37.2 pg (ref >27.9)

## 2022-04-27 LAB — DAT, POLYSPECIFIC AHG (ARMC ONLY): Polyspecific AHG test: NEGATIVE

## 2022-04-27 LAB — HEMOGLOBIN AND HEMATOCRIT, BLOOD
HCT: 29.6 % — ABNORMAL LOW (ref 36.0–46.0)
Hemoglobin: 9.5 g/dL — ABNORMAL LOW (ref 12.0–15.0)

## 2022-04-27 LAB — LACTATE DEHYDROGENASE: LDH: 197 U/L — ABNORMAL HIGH (ref 98–192)

## 2022-04-27 MED ORDER — EPOETIN ALFA-EPBX 40000 UNIT/ML IJ SOLN
40000.0000 [IU] | Freq: Once | INTRAMUSCULAR | Status: AC
  Administered 2022-04-27: 40000 [IU] via SUBCUTANEOUS
  Filled 2022-04-27: qty 1

## 2022-04-28 LAB — HAPTOGLOBIN: Haptoglobin: 139 mg/dL (ref 41–333)

## 2022-05-08 ENCOUNTER — Ambulatory Visit (INDEPENDENT_AMBULATORY_CARE_PROVIDER_SITE_OTHER): Payer: Medicare Other | Admitting: Dermatology

## 2022-05-08 DIAGNOSIS — R202 Paresthesia of skin: Secondary | ICD-10-CM

## 2022-05-08 DIAGNOSIS — L814 Other melanin hyperpigmentation: Secondary | ICD-10-CM

## 2022-05-08 DIAGNOSIS — L578 Other skin changes due to chronic exposure to nonionizing radiation: Secondary | ICD-10-CM

## 2022-05-08 DIAGNOSIS — L219 Seborrheic dermatitis, unspecified: Secondary | ICD-10-CM | POA: Diagnosis not present

## 2022-05-08 DIAGNOSIS — L304 Erythema intertrigo: Secondary | ICD-10-CM | POA: Diagnosis not present

## 2022-05-08 DIAGNOSIS — L821 Other seborrheic keratosis: Secondary | ICD-10-CM

## 2022-05-08 DIAGNOSIS — L570 Actinic keratosis: Secondary | ICD-10-CM | POA: Diagnosis not present

## 2022-05-08 DIAGNOSIS — L720 Epidermal cyst: Secondary | ICD-10-CM

## 2022-05-08 MED ORDER — HYDROCORTISONE 2.5 % EX CREA: TOPICAL_CREAM | CUTANEOUS | 2 refills | Status: DC

## 2022-05-08 MED ORDER — KETOCONAZOLE 2 % EX CREA: TOPICAL_CREAM | CUTANEOUS | 2 refills | Status: DC

## 2022-05-08 MED ORDER — KETOCONAZOLE 2 % EX SHAM: MEDICATED_SHAMPOO | CUTANEOUS | 2 refills | Status: DC

## 2022-05-08 NOTE — Patient Instructions (Addendum)
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 This is usually accompanied by chronic rubbing or scratching often leaving the area of skin discolored and thickened. There is no cure, but there are some treatments which may help control the itch. Recommend OTC Gold Bond Rapid Relief Anti-Itch cream (pramoxine + menthol), CeraVe Anti-itch cream or lotion (pramoxine), Sarna lotion (Original- menthol + camphor or Sensitive- pramoxine) or Eucerin 12 hour Itch Relief lotion (menthol) up to 3 times per day to areas on body that are itchy (back).    Over the counter (non-prescription) treatments for notalgia paresthetica include numbing creams like pramoxine or lidocaine which temporarily reduce itch or Capsaicin-containing creams which cause a burning sensation but which sometimes over time will reset the nerves to stop producing itch.  If you choose to use Capsaicin cream, it is recommended to use it 5 times daily for 1 week followed by 3 times daily for 3-6 weeks. You may have to continue using it long-term. For severe cases, there are some prescription cream or pill options which may help. Other treatment options include: - Transcutaneous Electrical Nerve Stimulation (TENS) - Gabapentin 300-900 mg daily po - Amitriptyline orally - Paravertebral local anesthetic block - intralesional Botulinum toxin A  Intertrigo (yeast)  Mix hydrocortisone with ketaconazole 2% twice a day. If improved, decrease to hydrocortisone and ketaconazole mixed once a day. If still clear, decrease to ketaconazole only. Start Zeasorb AF Powder to skin fold areas daily. Avoid baby powder with cornstarch.  Start ketoconazole 2% shampoo - massage into scalp and let sit several minutes before rinsing. Use 2-3 times a week.   Cryotherapy Aftercare  Wash gently with soap and water everyday.   Apply Vaseline  and Band-Aid daily until healed.   Due to recent changes in healthcare laws, you may see results of your pathology and/or laboratory studies on MyChart before the doctors have had a chance to review them. We understand that in some cases there may be results that are confusing or concerning to you. Please understand that not all results are received at the same time and often the doctors may need to interpret multiple results in order to provide you with the best plan of care or course of treatment. Therefore, we ask that you please give [place] 2 business days to thoroughly review all your results before contacting the office for clarification. Should we see a critical lab result, you will be contacted sooner.   If You Need Anything After Your Visit  If you have any questions or concerns for your doctor, please call our main line at [phone] and press option 4 to reach your doctor's medical assistant. If no one answers, please leave a voicemail as directed and we will return your call as soon as possible. Messages left after 4 pm will be answered the following business day.   You may also send [place] a message via [place]. We typically respond to MyChart messages within 1-2 business days.  For prescription refills, please ask your pharmacy to contact our office. Our fax number is [phone].  If you have an urgent issue when the clinic is closed that cannot wait until the next business day, you can page your doctor at the number below.    Please note that while we do our best to be available for urgent issues outside of office hours, we are  not available 24/7.   If you have an urgent issue and are unable to reach [place], you may choose to seek medical care at your doctor's office, retail clinic, urgent care center, or emergency room.  If you have a medical emergency, please immediately call 911 or go to the emergency department.  Pager Numbers  - Dr. Nehemiah Massed: [phone]  - Dr. Laurence Ferrari:  [phone]  - Dr. Nicole Kindred: [phone]  In the event of inclement weather, please call our main line at [phone] for an update on the status of any delays or closures.  Dermatology Medication Tips: Please keep the boxes that topical medications come in in order to help keep track of the instructions about where and how to use these. Pharmacies typically print the medication instructions only on the boxes and not directly on the medication tubes.   If your medication is too expensive, please contact our office at [phone] option 4 or send [place] a message through [place].   We are unable to tell what your co-pay for medications will be in advance as this is different depending on your insurance coverage. However, we may be able to find a substitute medication at lower cost or fill out paperwork to get insurance to cover a needed medication.   If a prior authorization is required to get your medication covered by your insurance company, please allow [place] 1-2 business days to complete this process.  Drug prices often vary depending on where the prescription is filled and some pharmacies may offer cheaper prices.  The website www.goodrx.com contains coupons for medications through different pharmacies. The prices here do not account for what the cost may be with help from insurance (it may be cheaper with your insurance), but the website can give you the price if you did not use any insurance.  - You can print the associated coupon and take it with your prescription to the pharmacy.  - You may also stop by our office during regular business hours and pick up a GoodRx coupon card.  - If you need your prescription sent electronically to a different pharmacy, notify our office through [facility] or by phone at [phone] option 4.     Si Usted Necesita Algo Despus de Su Visita  Tambin puede enviarnos un mensaje a travs de Pharmacist, community. Por lo general respondemos a los mensajes de  MyChart en el transcurso de 1 a 2 das hbiles.  Para renovar recetas, por favor pida a su farmacia que se ponga en contacto con nuestra oficina. Harland Dingwall de fax es [place] [phone].  Si tiene un asunto urgente cuando la clnica est cerrada y que no puede esperar hasta el siguiente da hbil, puede llamar/localizar a su doctor(a) al nmero que aparece a continuacin.   Por favor, tenga en cuenta que aunque hacemos todo lo posible para estar disponibles para asuntos urgentes fuera del horario de [place], no estamos disponibles las 24 horas del da, los 7 das de la [place].   Si tiene un problema urgente y no puede comunicarse con nosotros, puede optar por buscar atencin mdica  en el consultorio de su doctor(a), en una clnica privada, en un centro de atencin urgente o en una sala de emergencias.  Si tiene Engineering geologist, por favor llame inmediatamente al 911 o vaya a la sala de emergencias.  Nmeros de bper  - Dr. Nehemiah Massed: [phone]  - Dra. Moye: [phone]  - Dra. Nicole Kindred: [phone]  En caso de inclemencias del tiempo, por favor  llame a Cleotis Nipper lnea principal al [phone] para una actualizacin sobre el [place] de cualquier retraso o cierre.  Consejos para la medicacin en dermatologa: Por favor, guarde las cajas en las que vienen los medicamentos de uso tpico para ayudarle a seguir las instrucciones sobre dnde y cmo usarlos. Las farmacias generalmente imprimen las instrucciones del medicamento slo en las cajas y no directamente en los tubos del [place].   Si su medicamento es muy caro, por favor, pngase en contacto con Zigmund Daniel llamando al [phone] y presione la opcin 4 o envenos un mensaje a travs de Pharmacist, community.   No podemos decirle cul ser su copago por los medicamentos por adelantado ya que esto es diferente dependiendo de la cobertura de su seguro. Sin embargo, es posible que podamos encontrar un medicamento sustituto a Contractor un formulario para que el seguro cubra el medicamento que se considera necesario.   Si se requiere una autorizacin previa para que su compaa de seguros [place] su medicamento, por favor permtanos de 1 a 2 das hbiles para completar este proceso.  Los precios de los medicamentos varan con frecuencia dependiendo del Environmental consultant de dnde se surte la receta y alguna farmacias pueden ofrecer precios ms baratos.  El sitio web www.goodrx.com tiene cupones para medicamentos de Airline pilot. Los precios aqu no tienen en cuenta lo que podra costar con la ayuda del seguro (puede ser ms barato con su seguro), pero el sitio web puede darle el precio si no utiliz Research scientist (physical sciences).  - Puede imprimir el cupn correspondiente y llevarlo con su receta a la farmacia.  - Tambin puede pasar por nuestra oficina durante el horario de atencin regular y Charity fundraiser una tarjeta de cupones de GoodRx.  - Si necesita que su receta se enve electrnicamente a una farmacia diferente, informe a nuestra oficina a travs de MyChart de [place] o por telfono llamando al [phone] y presione la opcin 4.

## 2022-05-08 NOTE — Progress Notes (Signed)
Follow-Up Visit   Subjective  Jocelyn Elliott is a 88 y.o. female who presents for the following: Check several spots.  Patient here to have itchy and sore spots of the scalp checked, also hairloss in the same areas. She also has intertrigo of the inframammary and lower abdomen. She is using baby powder with cornstarch. In the past she has used Nystatin cream and powder with no improvement.   Patient here with daughter, Shirlean Mylar.   The following portions of the chart were reviewed this encounter and updated as appropriate:       Review of Systems:  No other skin or systemic complaints except as noted in HPI or Assessment and Plan.  Objective  Well appearing patient in no apparent distress; mood and affect are within normal limits.  A focused examination was performed including face, back, scalp, chest. Relevant physical exam findings are noted in the Assessment and Plan.  Scalp x 4, forehead x 5 (9) Pink scaly macules Mid forehead pink slightly depressed macule, slightly pearly 45mm  Scalp Mild erythema of the scalp, pt c/o itching  Inframammary Folds, inguinal folds Light pink papules, patches   Right Paraspinal Upper Back at Scapula Clear, persistent itchy spot    Assessment & Plan  Actinic Damage - chronic, secondary to cumulative UV radiation exposure/sun exposure over time - diffuse scaly erythematous macules with underlying dyspigmentation - Recommend daily broad spectrum sunscreen SPF 30+ to sun-exposed areas, reapply every 2 hours as needed.  - Recommend staying in the shade or wearing long sleeves, sun glasses (UVA+UVB protection) and wide brim hats (4-inch brim around the entire circumference of the hat). - Call for new or changing lesions.  Seborrheic Keratoses - Stuck-on, waxy, tan-brown papules and/or plaques  - Benign-appearing - Discussed benign etiology and prognosis. - Observe - Call for any changes  Lentigines - Scattered tan macules - Due to sun  exposure - Benign-appering, observe - Recommend daily broad spectrum sunscreen SPF 30+ to sun-exposed areas, reapply every 2 hours as needed. - Call for any changes  Milia - tiny firm white papules perioral - type of cyst - benign - may be extracted if symptomatic - observe   Actinic keratosis (9) Scalp x 4, forehead x 5  vs ISKs - face  Recheck mid upper forehead on f/u.  Destruction of lesion - Scalp x 4, forehead x 5  Destruction method: cryotherapy   Informed consent: discussed and consent obtained   Lesion destroyed using liquid nitrogen: Yes   Region frozen until ice ball extended beyond lesion: Yes   Outcome: patient tolerated procedure well with no complications   Post-procedure details: wound care instructions given   Additional details:  Prior to procedure, discussed risks of blister formation, small wound, skin dyspigmentation, or rare scar following cryotherapy. Recommend Vaseline ointment to treated areas while healing.   Seborrheic dermatitis Scalp  With pruritus, Chronic and persistent condition with duration or expected duration over one year. Condition is bothersome/symptomatic for patient.  Seborrheic Dermatitis  -  is a chronic persistent rash characterized by pinkness and scaling most commonly of the mid face but also can occur on the scalp (dandruff), ears; mid chest, mid back and groin.  It tends to be exacerbated by stress and cooler weather.  People who have neurologic disease may experience new onset or exacerbation of existing seborrheic dermatitis.  The condition is not curable but treatable and can be controlled.  Start ketoconazole 2% shampoo massage into scalp and let sit  several minutes before rinsing dsp 155mL 2Rf.  ketoconazole (NIZORAL) 2 % shampoo - Scalp Massage into scalp and let sit several minutes before rinsing. Use 2-3 times a week.  Erythema intertrigo Inframammary Folds, inguinal folds  Chronic and persistent condition with  duration or expected duration over one year. Condition is bothersome/symptomatic for patient. Currently flared.   Intertrigo is a chronic recurrent rash that occurs in skin fold areas that may be associated with friction; heat; moisture; yeast; fungus; and bacteria.  It is exacerbated by increased movement / activity; sweating; and higher atmospheric temperature.  Start 2% ketoconazole cream qd/bid as directed Start hydrocortisone 2.5% cream qd/bid as directed  hydrocortisone with ketaconazole 2% twice a day. If improved, decrease to hydrocortisone and ketaconazole mixed once a day. If still clear, decrease to ketaconazole only. Start Zeasorb AF Powder to skin fold areas daily. Avoid baby powder with cornstarch.  ketoconazole (NIZORAL) 2 % cream - Inframammary Folds, inguinal folds Apply once to twice daily to skin folds for rash as directed.  hydrocortisone 2.5 % cream - Inframammary Folds, inguinal folds Apply once to twice daily to skin folds as directed.  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 This is usually accompanied by chronic rubbing or scratching often leaving the area of skin discolored and thickened. There is no cure, but there are some treatments which may help control the itch.   Recommend OTC Gold Bond Rapid Relief Anti-Itch cream (pramoxine + menthol), CeraVe Anti-itch cream or lotion (pramoxine), Sarna lotion (Original- menthol + camphor or Sensitive- pramoxine) or Eucerin 12 hour Itch Relief lotion (menthol) up to 3 times per day to areas on body that are itchy.   Over the counter (non-prescription) treatments for notalgia paresthetica include numbing creams like pramoxine or lidocaine which temporarily reduce itch or Capsaicin-containing creams which cause a burning sensation but  which sometimes over time will reset the nerves to stop producing itch.  If you choose to use Capsaicin cream, it is recommended to use it 5 times daily for 1 week followed by 3 times daily for 3-6 weeks. You may have to continue using it long-term. For severe cases, there are some prescription cream or pill options which may help. Other treatment options include: - Transcutaneous Electrical Nerve Stimulation (TENS) - Gabapentin 300-900 mg daily po - Amitriptyline orally - Paravertebral local anesthetic block - intralesional Botulinum toxin A   Return in about 8 weeks (around 07/03/2022) for AKs/ISKs.  IJamesetta Orleans, CMA, am acting as scribe for Brendolyn Patty, MD . Documentation: I have reviewed the above documentation for accuracy and completeness, and I agree with the above.  Brendolyn Patty MD

## 2022-05-21 ENCOUNTER — Inpatient Hospital Stay: Payer: Medicare Other | Attending: Internal Medicine | Admitting: Internal Medicine

## 2022-05-21 ENCOUNTER — Inpatient Hospital Stay: Payer: Medicare Other

## 2022-05-21 ENCOUNTER — Encounter: Payer: Self-pay | Admitting: Internal Medicine

## 2022-05-21 VITALS — BP 146/70 | HR 85 | Temp 98.1°F | Resp 16

## 2022-05-21 DIAGNOSIS — N183 Chronic kidney disease, stage 3 unspecified: Secondary | ICD-10-CM | POA: Diagnosis not present

## 2022-05-21 DIAGNOSIS — D631 Anemia in chronic kidney disease: Secondary | ICD-10-CM | POA: Diagnosis not present

## 2022-05-21 DIAGNOSIS — D508 Other iron deficiency anemias: Secondary | ICD-10-CM

## 2022-05-21 DIAGNOSIS — N184 Chronic kidney disease, stage 4 (severe): Secondary | ICD-10-CM | POA: Diagnosis not present

## 2022-05-21 DIAGNOSIS — D649 Anemia, unspecified: Secondary | ICD-10-CM

## 2022-05-21 LAB — CBC WITH DIFFERENTIAL/PLATELET
Abs Immature Granulocytes: 0.03 10*3/uL (ref 0.00–0.07)
Basophils Absolute: 0.1 10*3/uL (ref 0.0–0.1)
Basophils Relative: 1 %
Eosinophils Absolute: 0.2 10*3/uL (ref 0.0–0.5)
Eosinophils Relative: 3 %
HCT: 32.5 % — ABNORMAL LOW (ref 36.0–46.0)
Hemoglobin: 10.3 g/dL — ABNORMAL LOW (ref 12.0–15.0)
Immature Granulocytes: 0 %
Lymphocytes Relative: 18 %
Lymphs Abs: 1.3 10*3/uL (ref 0.7–4.0)
MCH: 33.6 pg (ref 26.0–34.0)
MCHC: 31.7 g/dL (ref 30.0–36.0)
MCV: 105.9 fL — ABNORMAL HIGH (ref 80.0–100.0)
Monocytes Absolute: 0.4 10*3/uL (ref 0.1–1.0)
Monocytes Relative: 6 %
Neutro Abs: 5.1 10*3/uL (ref 1.7–7.7)
Neutrophils Relative %: 72 %
Platelets: 172 10*3/uL (ref 150–400)
RBC: 3.07 MIL/uL — ABNORMAL LOW (ref 3.87–5.11)
RDW: 12.9 % (ref 11.5–15.5)
WBC: 7.1 10*3/uL (ref 4.0–10.5)
nRBC: 0 % (ref 0.0–0.2)

## 2022-05-21 LAB — BASIC METABOLIC PANEL
Anion gap: 9 (ref 5–15)
BUN: 22 mg/dL (ref 8–23)
CO2: 24 mmol/L (ref 22–32)
Calcium: 9 mg/dL (ref 8.9–10.3)
Chloride: 106 mmol/L (ref 98–111)
Creatinine, Ser: 1.19 mg/dL — ABNORMAL HIGH (ref 0.44–1.00)
GFR, Estimated: 44 mL/min — ABNORMAL LOW (ref >60)
Glucose, Bld: 81 mg/dL (ref 70–99)
Potassium: 4.3 mmol/L (ref 3.5–5.1)
Sodium: 139 mmol/L (ref 135–145)

## 2022-05-21 LAB — VITAMIN B12: Vitamin B-12: 604 pg/mL (ref 180–914)

## 2022-05-21 MED ORDER — CYANOCOBALAMIN 1000 MCG/ML IJ SOLN
1000.0000 ug | Freq: Once | INTRAMUSCULAR | Status: AC
  Administered 2022-05-21: 1000 ug via INTRAMUSCULAR
  Filled 2022-05-21: qty 1

## 2022-05-21 MED ORDER — SODIUM CHLORIDE 0.9 % IV SOLN
510.0000 mg | Freq: Once | INTRAVENOUS | Status: AC
  Administered 2022-05-21: 510 mg via INTRAVENOUS
  Filled 2022-05-21: qty 17

## 2022-05-21 MED ORDER — SODIUM CHLORIDE 0.9 % IV SOLN
Freq: Once | INTRAVENOUS | Status: AC
  Filled 2022-05-21: qty 250

## 2022-05-21 MED FILL — Ferumoxytol Inj 510 MG/17ML (30 MG/ML) (Elemental Fe): INTRAVENOUS | Qty: 17 | Status: AC

## 2022-05-21 NOTE — Assessment & Plan Note (Addendum)
#  Severe anemia hemoglobin-nadir 6 [summer 2020]. Likely CKD/iron deficiency [July 2021- Bone marrow biopsy-mild dyserythropoietic changes]. [place] for hemolysis [haptoglobin-WNL]  # Today hemoglobin is 10.3. continue Feraheme monthly. FEB 2023- sat- 14%; ferritin 30.  HOLD retacrit today.    # B12 def- FEB 2023- 232; start B12 injections every monthly.Luetta Nutting 2023]   # CKD stage III-1.5- GFR- 40s STABLE;  Recommend continued p.o. intake.; STABLE  # TIA: on plavix [Dr.Shah] on PT.  # DISPOSITION:  # Ferrahem today; proceed with B12 injection; HOLD  retacrit today # in 2 weeks- H&H; possible retacrit # in 4 weeks-  NP- labs-  labs-cbc;bmp;iron studies;feritin; possible Ferrahem or possible retacrit; B12 injection- Dr.B

## 2022-05-21 NOTE — Progress Notes (Signed)
[facility] OFFICE PROGRESS NOTE  Patient Care Team: Einar Pheasant, MD as PCP - General (Internal Medicine) Einar Pheasant, MD (Internal Medicine) Isaias Cowman, MD (Internal Medicine) Brendolyn Patty, MD (Specialist) Schnier, Dolores Lory, MD (Vascular Surgery) Isaias Cowman, MD (Internal Medicine) Brendolyn Patty, MD (Specialist) Schnier, Dolores Lory, MD (Vascular Surgery) Cammie Sickle, MD as Consulting Physician (Oncology)   SUMMARY OF HEMATOLOGIC HISTORY:  # IRON DEFICIENCY ANEMIA- recurrent [? GI blood loss; Dr.Elliot; AVM-capsule]; IV Ferrahem q 31m last colo- 2012/march; EGD-- [id number]  July 2020 bone marrow biopsy-mild dysplastic changes; 40% hypercellularity; cytogenetics-normal.-Retacrit.MARCH 2023- [facility] Hem- TP53 subclonal. B12 def- FEB 2023- Start B12 injections Monthly; MAY 3rd, 2023- Start Retacrit 20K [place].   #Colonoscopy-April 2020 aborted because of cardiac pause.  # CKD stage III-IV; CAD-Dr. PSaralyn Pilar  March 30th, 2022-TIA/slurred spleech [Dr.Shah]  INTERVAL HISTORY: In a wheelchair.  Alone.    88-year-old female patient with above history of recurrent iron deficiency anemia unclear etiology/?-related to CKD-on Feraheme/Retacrit is here for follow-up.  Patient denies any blood in stools or black-colored stools. Patient denies any nausea vomiting. Patient admits to improvement of her fatigue .   Review of Systems  Constitutional:  Positive for malaise/fatigue. Negative for chills, diaphoresis, fever and weight loss.  HENT:  Negative for nosebleeds and sore throat.   Eyes:  Negative for double vision.  Respiratory:  Negative for cough, hemoptysis, sputum production and wheezing.   Cardiovascular:  Negative for palpitations, orthopnea and leg swelling.  Gastrointestinal:  Negative for abdominal pain, blood in stool, constipation, diarrhea, heartburn, melena, nausea and vomiting.  Genitourinary:  Negative for dysuria,  frequency and urgency.  Musculoskeletal:  Negative for back pain and joint pain.  Skin: Negative.  Negative for itching and rash.  Neurological:  Negative for dizziness, tingling, focal weakness, weakness and headaches.  Endo/Heme/Allergies:  Does not bruise/bleed easily.  Psychiatric/Behavioral:  Negative for depression. The patient is not nervous/anxious and does not have insomnia.      PAST MEDICAL HISTORY :  Past Medical History:  Diagnosis Date   Arthritis    AV malformation of gastrointestinal tract    Blood in stool    CAD (coronary artery disease)    Carotid arterial disease (HCC)    Cataracts, bilateral    Chronic blood loss anemia    Chronic cystitis    CKD (chronic kidney disease), stage III (HCC)    Complication of anesthesia    reaction to propofol - caused heart to pause   Depression    Diabetes mellitus (HCC)    GERD (gastroesophageal reflux disease)    Hyperlipidemia    Hypertension    IDA (iron deficiency anemia)    Stress incontinence    TIA (transient ischemic attack) 03/2021   No deficits   Wears dentures    full upper and lower    PAST SURGICAL HISTORY :   Past Surgical History:  Procedure Laterality Date   ABDOMINAL HYSTERECTOMY  11/19/1970   ovaries left in place   APPENDECTOMY  11/19/1990   BACK SURGERY  06/08/2010   L3, L4, L5   CARDIAC CATHETERIZATION  02/2001   CAROTID ARTERY ANGIOPLASTY  09/19/2002   CARPAL TUNNEL RELEASE  1991, 92   right then left    CATARACT EXTRACTION W/PHACO Left 02/13/2022   Procedure: CATARACT EXTRACTION PHACO AND INTRAOCULAR LENS PLACEMENT ([place] LEFT DIABETIC 14.90 01:26.8;  Surgeon: PBirder Robson MD;  [facility] [facility]  Service: Ophthalmology;  Laterality: Left;  Diabetic  CATARACT EXTRACTION W/PHACO Right 03/06/2022   Procedure: CATARACT EXTRACTION PHACO AND INTRAOCULAR LENS PLACEMENT ([facility]) RIGHT DIABETIC 9.66 00:53.7;  Surgeon: Birder Robson, MD;  Location: [facility];  Service:  Ophthalmology;  Laterality: Right;  Diabetic   CHOLECYSTECTOMY  11/20/1991   COLONOSCOPY WITH PROPOFOL N/A 02/02/2019   Procedure: COLONOSCOPY WITH PROPOFOL;  Surgeon: Manya Silvas, MD;  Location: [facility] ENDOSCOPY;  Service: Endoscopy;  Laterality: N/A;   ESOPHAGOGASTRODUODENOSCOPY N/A 02/02/2019   Procedure: ESOPHAGOGASTRODUODENOSCOPY (EGD);  Surgeon: Manya Silvas, MD;  Location: [facility] ENDOSCOPY;  Service: Endoscopy;  Laterality: N/A;   FOOT SURGERY  06/20/2007   Left   right carotid artery surgery  01/09/2013   Dr. Delana Meyer @ [facility]   TOTAL HIP ARTHROPLASTY  05/03/2011   left 12, right 11/07    FAMILY HISTORY :   Family History  Problem Relation Age of Onset   Hyperlipidemia Father    Heart disease Father        myocardial infarction   Hypertension Father        Parent   Arthritis Other        parent   Diabetes Other        nephew   Cervical cancer Sister    Rectal cancer Sister    Breast cancer Neg Hx     SOCIAL HISTORY:   Social History   Tobacco Use   Smoking status: Former    Types: Cigarettes    Quit date: 11/19/1989    Years since quitting: 32.5   Smokeless tobacco: Never  Vaping Use   Vaping Use: Never used  Substance Use Topics   Alcohol use: No    Alcohol/week: 0.0 standard drinks of alcohol   Drug use: No    ALLERGIES:  is allergic to propofol, ciprofloxacin, levaquin [levofloxacin], and tequin [gatifloxacin].  MEDICATIONS:  Current Outpatient Medications  Medication Sig Dispense Refill   aspirin 81 MG tablet Take 81 mg by mouth daily.     Biotin 5000 MCG CAPS Take 1 capsule by mouth daily.     blood glucose meter kit and supplies KIT Dispense based on insurance preference. Use daily to check sugars once daily. Dx e11.9 1 each 0   cetirizine (ZYRTEC) 10 MG tablet Take 10 mg by mouth daily.     docusate sodium (COLACE) 100 MG capsule Take 100 mg by mouth daily as needed for mild constipation.     ferrous sulfate 325 (65 FE) MG EC tablet Take 325  mg by mouth daily with breakfast. Every other day     hydrocortisone 2.5 % cream Apply once to twice daily to skin folds as directed. 30 g 2   isosorbide mononitrate (IMDUR) 30 MG 24 hr tablet Take 30 mg by mouth daily.     ketoconazole (NIZORAL) 2 % cream Apply once to twice daily to skin folds for rash as directed. 60 g 2   ketoconazole (NIZORAL) 2 % shampoo Massage into scalp and let sit several minutes before rinsing. Use 2-3 times a week. 120 mL 2   Lancets (ONETOUCH DELICA PLUS [id number]) MISC USE AS INSTRUCTED TO CHECK BLOOD SUGARS ONCE DAILY. DX E 11.9 100 each 12   metFORMIN (GLUCOPHAGE) 500 MG tablet TAKE 1 TABLET BY MOUTH EVERY DAY WITH BREAKFAST 90 tablet 1   Omega-3 Fatty Acids (FISH OIL) 1200 MG CAPS Take 1 capsule by mouth daily.     ONETOUCH ULTRA test strip USE AS INSTRUCTED TO CHECK BLOOD SUGARS ONCE DAILY. DX E11.9 100  strip 12   Probiotic, Lactobacillus, CAPS      rosuvastatin (CRESTOR) 40 MG tablet TAKE 1 TABLET BY MOUTH EVERY DAY 90 tablet 1   traZODone (DESYREL) 50 MG tablet TAKE 0.5-1 TABLETS BY MOUTH AT BEDTIME AS NEEDED FOR SLEEP. 90 tablet 1   ketorolac (ACULAR) 0.5 % ophthalmic solution SMARTSIG:In Eye(s) (Patient not taking: Reported on 04/23/2022)     prednisoLONE acetate (PRED FORTE) 1 % ophthalmic suspension SMARTSIG:In Eye(s) (Patient not taking: Reported on 04/23/2022)     No current facility-administered medications for this visit.    PHYSICAL EXAMINATION:   BP (!) 95/55 (BP Location: Left Arm, Patient Position: Sitting, Cuff Size: Normal)   Pulse 88   Temp 98 F (36.7 C) (Tympanic)   Ht 5' (1.524 m)   Wt 139 lb 8 oz (63.3 kg)   SpO2 96%   BMI 27.24 kg/m   Filed Weights   05/21/22 1317  Weight: 139 lb 8 oz (63.3 kg)     Physical Exam Constitutional:      Comments: Frail-appearing Caucasian female patient.  She is in a wheelchair.  HENT:     Head: Normocephalic and atraumatic.     Mouth/Throat:     Pharynx: No oropharyngeal exudate.  Eyes:      Pupils: Pupils are equal, round, and reactive to light.     Comments: Positive for pallor.  Cardiovascular:     Rate and Rhythm: Normal rate and regular rhythm.  Pulmonary:     Effort: No respiratory distress.     Breath sounds: No wheezing.  Abdominal:     General: Bowel sounds are normal. There is no distension.     Palpations: Abdomen is soft. There is no mass.     Tenderness: There is no abdominal tenderness. There is no guarding or rebound.  Musculoskeletal:        General: No tenderness. Normal range of motion.     Cervical back: Normal range of motion and neck supple.  Skin:    General: Skin is warm.  Neurological:     Mental Status: She is alert and oriented to person, place, and time.  Psychiatric:        Mood and Affect: Affect normal.     LABORATORY DATA:  I have reviewed the data as listed    Component Value Date/Time   NA 139 05/21/2022 1304   NA 143 01/10/2013 0314   K 4.3 05/21/2022 1304   K 4.0 01/10/2013 0314   CL 106 05/21/2022 1304   CL 109 (H) 01/10/2013 0314   CO2 24 05/21/2022 1304   CO2 27 01/10/2013 0314   GLUCOSE 81 05/21/2022 1304   GLUCOSE 119 (H) 01/10/2013 0314   BUN 22 05/21/2022 1304   BUN 15 01/10/2013 0314   CREATININE 1.19 (H) 05/21/2022 1304   CREATININE 1.03 01/10/2013 0314   CREATININE 1.13 (H) 09/18/2012 0848   CALCIUM 9.0 05/21/2022 1304   CALCIUM 8.1 (L) 01/10/2013 0314   PROT 6.3 02/07/2021 1121   PROT 7.2 12/20/2011 1521   ALBUMIN 4.0 02/07/2021 1121   ALBUMIN 3.9 12/20/2011 1521   AST 29 02/07/2021 1121   AST 31 12/20/2011 1521   ALT 29 02/07/2021 1121   ALT 37 12/20/2011 1521   ALKPHOS 55 02/07/2021 1121   ALKPHOS 57 12/20/2011 1521   BILITOT 0.4 02/07/2021 1121   BILITOT 0.5 12/20/2011 1521   GFRNONAA 44 (L) 05/21/2022 1304   GFRNONAA 52 (L) 01/10/2013 0314   GFRNONAA 47 (L)  09/18/2012 0848   GFRAA 46 (L) 07/12/2020 1240   GFRAA >60 01/10/2013 0314   GFRAA 54 (L) 09/18/2012 0848    No results found for:  "SPEP", "UPEP"  Lab Results  Component Value Date   WBC 7.1 05/21/2022   NEUTROABS 5.1 05/21/2022   HGB 10.3 (L) 05/21/2022   HCT 32.5 (L) 05/21/2022   MCV 105.9 (H) 05/21/2022   PLT 172 05/21/2022      Chemistry      Component Value Date/Time   NA 139 05/21/2022 1304   NA 143 01/10/2013 0314   K 4.3 05/21/2022 1304   K 4.0 01/10/2013 0314   CL 106 05/21/2022 1304   CL 109 (H) 01/10/2013 0314   CO2 24 05/21/2022 1304   CO2 27 01/10/2013 0314   BUN 22 05/21/2022 1304   BUN 15 01/10/2013 0314   CREATININE 1.19 (H) 05/21/2022 1304   CREATININE 1.03 01/10/2013 0314   CREATININE 1.13 (H) 09/18/2012 0848      Component Value Date/Time   CALCIUM 9.0 05/21/2022 1304   CALCIUM 8.1 (L) 01/10/2013 0314   ALKPHOS 55 02/07/2021 1121   ALKPHOS 57 12/20/2011 1521   AST 29 02/07/2021 1121   AST 31 12/20/2011 1521   ALT 29 02/07/2021 1121   ALT 37 12/20/2011 1521   BILITOT 0.4 02/07/2021 1121   BILITOT 0.5 12/20/2011 1521         ASSESSMENT & PLAN:   Anemia of chronic kidney failure, stage 3 (moderate) (HCC) #Severe anemia hemoglobin-nadir 6 [summer 2020]. Likely CKD/iron deficiency [July 2021- Bone marrow biopsy-mild dyserythropoietic changes]. [place] for hemolysis [haptoglobin-WNL]  # Today hemoglobin is 10.3. continue Feraheme monthly. FEB 2023- sat- 14%; ferritin 30.  HOLD retacrit today.    # B12 def- FEB 2023- 232; start B12 injections every monthly.Luetta Nutting 2023]   # CKD stage III-1.5- GFR- 40s STABLE;  Recommend continued p.o. intake.; STABLE  # TIA: on plavix [Dr.Shah] on PT.  # DISPOSITION:  # Ferrahem today; proceed with B12 injection; HOLD  retacrit today # in 2 weeks- H&H; possible retacrit # in 4 weeks-  NP- labs-  labs-cbc;bmp;iron studies;feritin; possible Ferrahem or possible retacrit; B12 injection- Dr.B      Cammie Sickle, MD 05/21/2022 1:59 PM

## 2022-06-04 ENCOUNTER — Inpatient Hospital Stay: Payer: Medicare Other

## 2022-06-04 VITALS — BP 111/48 | HR 73

## 2022-06-04 DIAGNOSIS — D631 Anemia in chronic kidney disease: Secondary | ICD-10-CM

## 2022-06-04 DIAGNOSIS — N183 Chronic kidney disease, stage 3 unspecified: Secondary | ICD-10-CM

## 2022-06-04 DIAGNOSIS — D649 Anemia, unspecified: Secondary | ICD-10-CM

## 2022-06-04 DIAGNOSIS — N184 Chronic kidney disease, stage 4 (severe): Secondary | ICD-10-CM | POA: Diagnosis not present

## 2022-06-04 LAB — HEMOGLOBIN AND HEMATOCRIT, BLOOD
HCT: 30.1 % — ABNORMAL LOW (ref 36.0–46.0)
Hemoglobin: 9.6 g/dL — ABNORMAL LOW (ref 12.0–15.0)

## 2022-06-04 MED ORDER — EPOETIN ALFA-EPBX 40000 UNIT/ML IJ SOLN
40000.0000 [IU] | Freq: Once | INTRAMUSCULAR | Status: AC
  Administered 2022-06-04: 40000 [IU] via SUBCUTANEOUS
  Filled 2022-06-04: qty 1

## 2022-06-18 ENCOUNTER — Inpatient Hospital Stay: Payer: Medicare Other

## 2022-06-18 ENCOUNTER — Inpatient Hospital Stay: Payer: Medicare Other | Admitting: Medical Oncology

## 2022-06-18 MED FILL — Ferumoxytol Inj 510 MG/17ML (30 MG/ML) (Elemental Fe): INTRAVENOUS | Qty: 17 | Status: AC

## 2022-06-20 ENCOUNTER — Other Ambulatory Visit: Payer: Self-pay | Admitting: Internal Medicine

## 2022-06-25 ENCOUNTER — Inpatient Hospital Stay: Payer: Medicare Other

## 2022-06-25 ENCOUNTER — Encounter: Payer: Self-pay | Admitting: Medical Oncology

## 2022-06-25 ENCOUNTER — Inpatient Hospital Stay (HOSPITAL_BASED_OUTPATIENT_CLINIC_OR_DEPARTMENT_OTHER): Payer: Medicare Other | Admitting: Medical Oncology

## 2022-06-25 ENCOUNTER — Inpatient Hospital Stay: Payer: Medicare Other | Attending: Internal Medicine

## 2022-06-25 VITALS — BP 100/89 | HR 68 | Temp 97.8°F | Resp 16 | Wt 131.9 lb

## 2022-06-25 DIAGNOSIS — D649 Anemia, unspecified: Secondary | ICD-10-CM

## 2022-06-25 DIAGNOSIS — D631 Anemia in chronic kidney disease: Secondary | ICD-10-CM | POA: Diagnosis present

## 2022-06-25 DIAGNOSIS — N183 Chronic kidney disease, stage 3 unspecified: Secondary | ICD-10-CM | POA: Diagnosis present

## 2022-06-25 DIAGNOSIS — D508 Other iron deficiency anemias: Secondary | ICD-10-CM

## 2022-06-25 LAB — CBC WITH DIFFERENTIAL/PLATELET
Abs Immature Granulocytes: 0.03 10*3/uL (ref 0.00–0.07)
Basophils Absolute: 0.1 10*3/uL (ref 0.0–0.1)
Basophils Relative: 1 %
Eosinophils Absolute: 0.2 10*3/uL (ref 0.0–0.5)
Eosinophils Relative: 3 %
HCT: 32 % — ABNORMAL LOW (ref 36.0–46.0)
Hemoglobin: 10.3 g/dL — ABNORMAL LOW (ref 12.0–15.0)
Immature Granulocytes: 0 %
Lymphocytes Relative: 23 %
Lymphs Abs: 1.6 10*3/uL (ref 0.7–4.0)
MCH: 33.6 pg (ref 26.0–34.0)
MCHC: 32.2 g/dL (ref 30.0–36.0)
MCV: 104.2 fL — ABNORMAL HIGH (ref 80.0–100.0)
Monocytes Absolute: 0.4 10*3/uL (ref 0.1–1.0)
Monocytes Relative: 7 %
Neutro Abs: 4.5 10*3/uL (ref 1.7–7.7)
Neutrophils Relative %: 66 %
Platelets: 208 10*3/uL (ref 150–400)
RBC: 3.07 MIL/uL — ABNORMAL LOW (ref 3.87–5.11)
RDW: 14 % (ref 11.5–15.5)
WBC: 6.8 10*3/uL (ref 4.0–10.5)
nRBC: 0 % (ref 0.0–0.2)

## 2022-06-25 LAB — BASIC METABOLIC PANEL
Anion gap: 9 (ref 5–15)
BUN: 34 mg/dL — ABNORMAL HIGH (ref 8–23)
CO2: 24 mmol/L (ref 22–32)
Calcium: 8.9 mg/dL (ref 8.9–10.3)
Chloride: 104 mmol/L (ref 98–111)
Creatinine, Ser: 1.36 mg/dL — ABNORMAL HIGH (ref 0.44–1.00)
GFR, Estimated: 37 mL/min — ABNORMAL LOW (ref >60)
Glucose, Bld: 78 mg/dL (ref 70–99)
Potassium: 3.9 mmol/L (ref 3.5–5.1)
Sodium: 137 mmol/L (ref 135–145)

## 2022-06-25 LAB — IRON AND TIBC
Iron: 89 ug/dL (ref 28–170)
Saturation Ratios: 28 % (ref 10.4–31.8)
TIBC: 323 ug/dL (ref 250–450)
UIBC: 234 ug/dL

## 2022-06-25 LAB — FERRITIN: Ferritin: 103 ng/mL (ref 11–307)

## 2022-06-25 MED ORDER — SODIUM CHLORIDE 0.9 % IV SOLN
510.0000 mg | Freq: Once | INTRAVENOUS | Status: AC
  Administered 2022-06-25: 510 mg via INTRAVENOUS
  Filled 2022-06-25: qty 510

## 2022-06-25 MED ORDER — SODIUM CHLORIDE 0.9 % IV SOLN
Freq: Once | INTRAVENOUS | Status: AC
  Filled 2022-06-25: qty 250

## 2022-06-25 MED ORDER — CYANOCOBALAMIN 1000 MCG/ML IJ SOLN
1000.0000 ug | Freq: Once | INTRAMUSCULAR | Status: AC
  Administered 2022-06-25: 1000 ug via INTRAMUSCULAR
  Filled 2022-06-25: qty 1

## 2022-06-25 NOTE — Progress Notes (Signed)
[facility]oc[facility] OFFICE PROGRESS NOTE  Patient Care Team: Einar Pheasant, MD as PCP - General (Internal Medicine) Einar Pheasant, MD (Internal Medicine) Isaias Cowman, MD (Internal Medicine) Brendolyn Patty, MD (Specialist) Schnier, Dolores Lory, MD (Vascular Surgery) Isaias Cowman, MD (Internal Medicine) Brendolyn Patty, MD (Specialist) Schnier, Dolores Lory, MD (Vascular Surgery) Cammie Sickle, MD as Consulting Physician (Oncology)   SUMMARY OF HEMATOLOGIC HISTORY:  # IRON DEFICIENCY ANEMIA- recurrent [? GI blood loss; Dr.Elliot; AVM-capsule]; IV Ferrahem q 19m last colo- 2012/march; EGD-- [id number]  July 2020 bone marrow biopsy-mild dysplastic changes; 40% hypercellularity; cytogenetics-normal.-Retacrit.MARCH 2023- [facility] One Hem- TP53 subclonal. B12 def- FEB 2023- Start B12 injections Monthly; MAY 3rd, 2023- Start Retacrit 20K [place].   #Colonoscopy-April 2020 aborted because of cardiac pause.  # CKD stage III-IV; CAD-Dr. PSaralyn Pilar  March 30th, 2022-TIA/slurred spleech [Dr.Shah]  INTERVAL HISTORY: In a wheelchair.  With a family member   88-year-old female patient with above history of recurrent iron deficiency anemia unclear etiology/?-related to CKD-on Feraheme/Retacrit is here for follow-up.  Patient reports that overall she is doing well. Some continue chronic fatigue. No bleeding episodes or changes in stools. No unintentional weight loss, night sweats.   Review of Systems  Constitutional:  Positive for malaise/fatigue. Negative for chills, diaphoresis, fever and weight loss.  HENT:  Negative for nosebleeds and sore throat.   Eyes:  Negative for double vision.  Respiratory:  Negative for cough, hemoptysis, sputum production and wheezing.   Cardiovascular:  Negative for palpitations, orthopnea and leg swelling.  Gastrointestinal:  Negative for abdominal pain, blood in stool, constipation, diarrhea, heartburn, melena, nausea and vomiting.   Genitourinary:  Negative for dysuria, frequency and urgency.  Musculoskeletal:  Negative for back pain and joint pain.  Skin: Negative.  Negative for itching and rash.  Neurological:  Negative for dizziness, tingling, focal weakness, weakness and headaches.  Endo/Heme/Allergies:  Does not bruise/bleed easily.  Psychiatric/Behavioral:  Negative for depression. The patient is not nervous/anxious and does not have insomnia.      PAST MEDICAL HISTORY :  Past Medical History:  Diagnosis Date   Arthritis    AV malformation of gastrointestinal tract    Blood in stool    CAD (coronary artery disease)    Carotid arterial disease (HCC)    Cataracts, bilateral    Chronic blood loss anemia    Chronic cystitis    CKD (chronic kidney disease), stage III (HCC)    Complication of anesthesia    reaction to propofol - caused heart to pause   Depression    Diabetes mellitus (HCC)    GERD (gastroesophageal reflux disease)    Hyperlipidemia    Hypertension    IDA (iron deficiency anemia)    Stress incontinence    TIA (transient ischemic attack) 03/2021   No deficits   Wears dentures    full upper and lower    PAST SURGICAL HISTORY :   Past Surgical History:  Procedure Laterality Date   ABDOMINAL HYSTERECTOMY  11/19/1970   ovaries left in place   APPENDECTOMY  11/19/1990   BACK SURGERY  06/08/2010   L3, L4, L5   CARDIAC CATHETERIZATION  02/2001   CAROTID ARTERY ANGIOPLASTY  09/19/2002   CARPAL TUNNEL RELEASE  1991, 92   right then left    CATARACT EXTRACTION W/PHACO Left 02/13/2022   Procedure: CATARACT EXTRACTION PHACO AND INTRAOCULAR LENS PLACEMENT ([place] LEFT DIABETIC 14.90 01:26.8;  Surgeon: PBirder Robson MD;  [facility] [facility]  Service: Ophthalmology;  Laterality: Left;  Diabetic   CATARACT EXTRACTION W/PHACO Right 03/06/2022   Procedure: CATARACT EXTRACTION PHACO AND INTRAOCULAR LENS PLACEMENT ([facility]) RIGHT DIABETIC 9.66 00:53.7;  Surgeon: Birder Robson, MD;   Location: [place];  Service: Ophthalmology;  Laterality: Right;  Diabetic   CHOLECYSTECTOMY  11/20/1991   COLONOSCOPY WITH PROPOFOL N/A 02/02/2019   Procedure: COLONOSCOPY WITH PROPOFOL;  Surgeon: Manya Silvas, MD;  Location: [facility] ENDOSCOPY;  Service: Endoscopy;  Laterality: N/A;   ESOPHAGOGASTRODUODENOSCOPY N/A 02/02/2019   Procedure: ESOPHAGOGASTRODUODENOSCOPY (EGD);  Surgeon: Manya Silvas, MD;  Location[facility]COPY;  Service: Endoscopy;  Laterality: N/A;   FOOT SURGERY  06/20/2007   Left   right carotid artery surgery  01/09/2013   Dr. Delana Meyer @ [facility]   TOTAL HIP ARTHROPLASTY  05/03/2011   left 12, right 11/07    FAMILY HISTORY :   Family History  Problem Relation Age of Onset   Hyperlipidemia Father    Heart disease Father        myocardial infarction   Hypertension Father        Parent   Arthritis Other        parent   Diabetes Other        nephew   Cervical cancer Sister    Rectal cancer Sister    Breast cancer Neg Hx     SOCIAL HISTORY:   Social History   Tobacco Use   Smoking status: Former    Types: Cigarettes    Quit date: 11/19/1989    Years since quitting: 32.6   Smokeless tobacco: Never  Vaping Use   Vaping Use: Never used  Substance Use Topics   Alcohol use: No    Alcohol/week: 0.0 standard drinks of alcohol   Drug use: No    ALLERGIES:  is allergic to propofol, ciprofloxacin, levaquin [levofloxacin], and tequin [gatifloxacin].  MEDICATIONS:  Current Outpatient Medications  Medication Sig Dispense Refill   aspirin 81 MG tablet Take 81 mg by mouth daily.     Biotin 5000 MCG CAPS Take 1 capsule by mouth daily.     blood glucose meter kit and supplies KIT Dispense based on insurance preference. Use daily to check sugars once daily. Dx e11.9 1 each 0   cetirizine (ZYRTEC) 10 MG tablet Take 10 mg by mouth daily.     docusate sodium (COLACE) 100 MG capsule Take 100 mg by mouth daily as needed for mild constipation.     ferrous  sulfate 325 (65 FE) MG EC tablet Take 325 mg by mouth daily with breakfast. Every other day     guaiFENesin (MUCINEX) 600 MG 12 hr tablet Take by mouth 2 (two) times daily as needed.     hydrocortisone 2.5 % cream Apply once to twice daily to skin folds as directed. 30 g 2   ketoconazole (NIZORAL) 2 % cream Apply once to twice daily to skin folds for rash as directed. 60 g 2   ketoconazole (NIZORAL) 2 % shampoo Massage into scalp and let sit several minutes before rinsing. Use 2-3 times a week. 120 mL 2   metFORMIN (GLUCOPHAGE) 500 MG tablet TAKE 1 TABLET BY MOUTH EVERY DAY WITH BREAKFAST 90 tablet 1   Omega-3 Fatty Acids (FISH OIL) 1200 MG CAPS Take 1 capsule by mouth daily.     Probiotic, Lactobacillus, CAPS      rosuvastatin (CRESTOR) 40 MG tablet TAKE 1 TABLET BY MOUTH EVERY DAY 90 tablet 1   traZODone (DESYREL) 50 MG tablet TAKE  1/2 TO 1 TABLET BY MOUTH AT BEDTIME AS NEEDED FOR SLEEP 90 tablet 1   isosorbide mononitrate (IMDUR) 30 MG 24 hr tablet Take 30 mg by mouth daily. (Patient not taking: Reported on 06/25/2022)     ketorolac (ACULAR) 0.5 % ophthalmic solution SMARTSIG:In Eye(s) (Patient not taking: Reported on 04/23/2022)     Lancets (ONETOUCH DELICA PLUS [id number]) MISC USE AS INSTRUCTED TO CHECK BLOOD SUGARS ONCE DAILY. DX E 11.9 100 each 12   ONETOUCH ULTRA test strip USE AS INSTRUCTED TO CHECK BLOOD SUGARS ONCE DAILY. DX E11.9 100 strip 12   prednisoLONE acetate (PRED FORTE) 1 % ophthalmic suspension SMARTSIG:In Eye(s) (Patient not taking: Reported on 04/23/2022)     No current facility-administered medications for this visit.    PHYSICAL EXAMINATION:   BP 100/89   Pulse 68   Temp 97.8 F (36.6 C)   Resp 16   Wt 131 lb 14.4 oz (59.8 kg)   SpO2 99%   BMI 25.76 kg/m   Filed Weights   06/25/22 1309  Weight: 131 lb 14.4 oz (59.8 kg)     Physical Exam Constitutional:      Comments: Frail-appearing Caucasian female patient.  She is in a wheelchair.  HENT:     Head:  Normocephalic and atraumatic.     Mouth/Throat:     Pharynx: No oropharyngeal exudate.  Eyes:     Pupils: Pupils are equal, round, and reactive to light.  Cardiovascular:     Rate and Rhythm: Normal rate and regular rhythm.  Pulmonary:     Effort: No respiratory distress.     Breath sounds: No wheezing.  Abdominal:     General: Bowel sounds are normal. There is no distension.     Palpations: Abdomen is soft. There is no mass.     Tenderness: There is no abdominal tenderness. There is no guarding or rebound.  Musculoskeletal:        General: No tenderness. Normal range of motion.     Cervical back: Normal range of motion and neck supple.  Skin:    General: Skin is warm.  Neurological:     Mental Status: She is alert and oriented to person, place, and time.  Psychiatric:        Mood and Affect: Affect normal.     LABORATORY DATA:  I have reviewed the data as listed    Component Value Date/Time   NA 137 06/25/2022 1247   NA 143 01/10/2013 0314   K 3.9 06/25/2022 1247   K 4.0 01/10/2013 0314   CL 104 06/25/2022 1247   CL 109 (H) 01/10/2013 0314   CO2 24 06/25/2022 1247   CO2 27 01/10/2013 0314   GLUCOSE 78 06/25/2022 1247   GLUCOSE 119 (H) 01/10/2013 0314   BUN 34 (H) 06/25/2022 1247   BUN 15 01/10/2013 0314   CREATININE 1.36 (H) 06/25/2022 1247   CREATININE 1.03 01/10/2013 0314   CREATININE 1.13 (H) 09/18/2012 0848   CALCIUM 8.9 06/25/2022 1247   CALCIUM 8.1 (L) 01/10/2013 0314   PROT 6.3 02/07/2021 1121   PROT 7.2 12/20/2011 1521   ALBUMIN 4.0 02/07/2021 1121   ALBUMIN 3.9 12/20/2011 1521   AST 29 02/07/2021 1121   AST 31 12/20/2011 1521   ALT 29 02/07/2021 1121   ALT 37 12/20/2011 1521   ALKPHOS 55 02/07/2021 1121   ALKPHOS 57 12/20/2011 1521   BILITOT 0.4 02/07/2021 1121   BILITOT 0.5 12/20/2011 1521   GFRNONAA 37 (L) 06/25/2022  [place] (L) 01/10/2013 0314   GFRNONAA 47 (L) 09/18/2012 0848   GFRAA 46 (L) 07/12/2020 1240   GFRAA >60 01/10/2013  0314   GFRAA 54 (L) 09/18/2012 0848    No results found for: "SPEP", "UPEP"  Lab Results  Component Value Date   WBC 6.8 06/25/2022   NEUTROABS 4.5 06/25/2022   HGB 10.3 (L) 06/25/2022   HCT 32.0 (L) 06/25/2022   MCV 104.2 (H) 06/25/2022   PLT 208 06/25/2022      Chemistry      Component Value Date/Time   NA 137 06/25/2022 1247   NA 143 01/10/2013 0314   K 3.9 06/25/2022 1247   K 4.0 01/10/2013 0314   CL 104 06/25/2022 1247   CL 109 (H) 01/10/2013 0314   CO2 24 06/25/2022 1247   CO2 27 01/10/2013 0314   BUN 34 (H) 06/25/2022 1247   BUN 15 01/10/2013 0314   CREATININE 1.36 (H) 06/25/2022 1247   CREATININE 1.03 01/10/2013 0314   CREATININE 1.13 (H) 09/18/2012 0848      Component Value Date/Time   CALCIUM 8.9 06/25/2022 1247   CALCIUM 8.1 (L) 01/10/2013 0314   ALKPHOS 55 02/07/2021 1121   ALKPHOS 57 12/20/2011 1521   AST 29 02/07/2021 1121   AST 31 12/20/2011 1521   ALT 29 02/07/2021 1121   ALT 37 12/20/2011 1521   BILITOT 0.4 02/07/2021 1121   BILITOT 0.5 12/20/2011 1521       ASSESSMENT & PLAN:  Encounter Diagnoses  Name Primary?   Anemia of chronic kidney failure, stage 3 (moderate) (HCC)    Other iron deficiency anemia Yes   Chronic in nature. Labs reviewed and stable. Feraheme and B12 today.    Today: B12 and Feraheme RTC 2 weeks H&H with possible retacrit  RTC in 4 weeks with MD, labs (CBC, BMP, Ferritin, iron, B12), possible feraheme or possible retacrit; [place], PA-C 06/25/2022 2:46 PM

## 2022-07-03 ENCOUNTER — Ambulatory Visit (INDEPENDENT_AMBULATORY_CARE_PROVIDER_SITE_OTHER): Payer: Medicare Other | Admitting: Dermatology

## 2022-07-03 DIAGNOSIS — L570 Actinic keratosis: Secondary | ICD-10-CM | POA: Diagnosis not present

## 2022-07-03 DIAGNOSIS — L649 Androgenic alopecia, unspecified: Secondary | ICD-10-CM

## 2022-07-03 DIAGNOSIS — L219 Seborrheic dermatitis, unspecified: Secondary | ICD-10-CM

## 2022-07-03 DIAGNOSIS — Z872 Personal history of diseases of the skin and subcutaneous tissue: Secondary | ICD-10-CM

## 2022-07-03 DIAGNOSIS — L578 Other skin changes due to chronic exposure to nonionizing radiation: Secondary | ICD-10-CM | POA: Diagnosis not present

## 2022-07-03 DIAGNOSIS — L820 Inflamed seborrheic keratosis: Secondary | ICD-10-CM | POA: Diagnosis not present

## 2022-07-03 DIAGNOSIS — L720 Epidermal cyst: Secondary | ICD-10-CM

## 2022-07-03 MED ORDER — FINASTERIDE 5 MG PO TABS: 5.0000 mg | ORAL_TABLET | Freq: Every day | ORAL | 1 refills | Status: DC

## 2022-07-03 NOTE — Progress Notes (Signed)
Follow-Up Visit   Subjective  Jocelyn Elliott is a 88 y.o. female who presents for the following: Follow-up.  Patient presents for 8 week follow-up AKs vs ISKs of the forehead and scalp. She also has new spots to check on the left ear, left paranasal, left upper inner arm (itchy), and chin. She also has hair loss that has been going on for years, but worsened recently. No recent surgery, illness, stress, or new medicines. No h/o breast cancer.  Patient accompanied by daughter.   The following portions of the chart were reviewed this encounter and updated as appropriate:       Review of Systems:  No other skin or systemic complaints except as noted in HPI or Assessment and Plan.  Objective  Well appearing patient in no apparent distress; mood and affect are within normal limits.  A focused examination was performed including face, scalp. Relevant physical exam findings are noted in the Assessment and Plan.  L upper inner arm x 1 Erythematous stuck-on, waxy papule  Scalp Thinning of the crown.     scalp x 2, L mid antihelix x 1, L mid helix x 1, L paranasal x 1 (5) Pink scaly macules.  Mid Frontal Scalp Scalp with mild erythema    Assessment & Plan  Actinic Damage - chronic, secondary to cumulative UV radiation exposure/sun exposure over time - diffuse scaly erythematous macules with underlying dyspigmentation - Recommend daily broad spectrum sunscreen SPF 30+ to sun-exposed areas, reapply every 2 hours as needed.  - Recommend staying in the shade or wearing long sleeves, sun glasses (UVA+UVB protection) and wide brim hats (4-inch brim around the entire circumference of the hat). - Call for new or changing lesions.  [place] - tiny firm white papules - type of cyst - benign - may be extracted if symptomatic - observe - sample of Effaclar 0.1% gel qhs - sample given  Inflamed seborrheic keratosis L upper inner arm x 1  Symptomatic, irritating, patient would like  treated.  Destruction of lesion - L upper inner arm x 1  Destruction method: cryotherapy   Informed consent: discussed and consent obtained   Lesion destroyed using liquid nitrogen: Yes   Region frozen until ice ball extended beyond lesion: Yes   Outcome: patient tolerated procedure well with no complications   Post-procedure details: wound care instructions given   Additional details:  Prior to procedure, discussed risks of blister formation, small wound, skin dyspigmentation, or rare scar following cryotherapy. Recommend Vaseline ointment to treated areas while healing.   Androgenetic alopecia Scalp  Chronic and persistent condition with duration or expected duration over one year. Condition is symptomatic/ bothersome to patient.  Female Androgenic Alopecia is a chronic condition related to genetics and/or hormonal changes.  In women androgenetic alopecia is commonly associated with menopause but may occur any time after puberty.  It causes hair thinning primarily on the crown with widening of the part and temporal hairline recession.  Can use OTC Rogaine (minoxidil) 5% solution/foam as directed.  Oral treatments in female patients who have no contraindication may include : - Low dose oral minoxidil 1.25 - 5mg  daily - Spironolactone 50 - 100mg  bid - Finasteride 2.5 - 5 mg daily Adjunctive therapies include: - Low Level Laser Light Therapy (LLLT) - Platelet-rich plasma injections (PRP) - Hair Transplants or scalp reduction   TSH normal 02/07/2021  Start finasteride 5mg  take 1/2 tablet PO QD dsp #30 1Rf. Start 5% Rogaine foam to scalp qhs  Recommend  minoxidil 5% (Rogaine for men) solution or foam to be applied to the scalp and left in. This should ideally be used twice daily for best results but it helps with hair regrowth when used at least three times per week. Rogaine initially can cause increased hair shedding for the first few weeks but this will stop with continued use. In  studies, people who used minoxidil (Rogaine) for at least 6 months had thicker hair than people who did not. Minoxidil topical (Rogaine) only works as long as it continues to be used. If if it is no longer used then the hair it has been helping to regrow can fall out. Minoxidil topical (Rogaine) can cause increased facial hair growth which can usually be managed easily with a battery-operated hair trimmer. If facial hair growth is bothersome, switching to the 2% women's version can decrease the risk of unwanted facial hair growth.   finasteride (PROSCAR) 5 MG tablet - Scalp Take 1 tablet (5 mg total) by mouth daily.  AK (actinic keratosis) (5) scalp x 2, L mid antihelix x 1, L mid helix x 1, L paranasal x 1  vs ISKs (scalp) vs CDNH (L mid antihelix)  Recommend donut pillow while sleeping to relieve pressure.   Actinic keratoses are precancerous spots that appear secondary to cumulative UV radiation exposure/sun exposure over time. They are chronic with expected duration over 1 year. A portion of actinic keratoses will progress to squamous cell carcinoma of the skin. It is not possible to reliably predict which spots will progress to skin cancer and so treatment is recommended to prevent development of skin cancer.  Recommend daily broad spectrum sunscreen SPF 30+ to sun-exposed areas, reapply every 2 hours as needed.  Recommend staying in the shade or wearing long sleeves, sun glasses (UVA+UVB protection) and wide brim hats (4-inch brim around the entire circumference of the hat). Call for new or changing lesions.  Destruction of lesion - scalp x 2, L mid antihelix x 1, L mid helix x 1, L paranasal x 1  Destruction method: cryotherapy   Informed consent: discussed and consent obtained   Lesion destroyed using liquid nitrogen: Yes   Region frozen until ice ball extended beyond lesion: Yes   Outcome: patient tolerated procedure well with no complications   Post-procedure details: wound care  instructions given   Additional details:  Prior to procedure, discussed risks of blister formation, small wound, skin dyspigmentation, or rare scar following cryotherapy. Recommend Vaseline ointment to treated areas while healing.   Seborrheic dermatitis Mid Frontal Scalp  Chronic condition with duration or expected duration over one year. Currently well-controlled.   Seborrheic Dermatitis  -  is a chronic persistent rash characterized by pinkness and scaling most commonly of the mid face but also can occur on the scalp (dandruff), ears; mid chest, mid back and groin.  It tends to be exacerbated by stress and cooler weather.  People who have neurologic disease may experience new onset or exacerbation of existing seborrheic dermatitis.  The condition is not curable but treatable and can be controlled.  Continue ketoconazole 2% shampoo (may alternate with Head & Shoulders shampoo) - massage into scalp and let sit several minutes before rinsing.  Related Medications ketoconazole (NIZORAL) 2 % shampoo Massage into scalp and let sit several minutes before rinsing. Use 2-3 times a week.   History of PreCancerous Actinic Keratosis  - site(s) of PreCancerous Actinic Keratosis clear today forehead. - these may recur and new lesions may form requiring  treatment to prevent transformation into skin cancer - observe for new or changing spots and contact [place] for appointment if occur - photoprotection with sun protective clothing; sunglasses and broad spectrum sunscreen with SPF of at least 30 + and frequent self skin exams recommended - yearly exams by a dermatologist recommended for persons with history of PreCancerous Actinic Keratoses   Return in about 4 months (around 11/02/2022) for alopecia.  IJamesetta Orleans, CMA, am acting as scribe for Brendolyn Patty, MD .  Documentation: I have reviewed the above documentation for accuracy and completeness, and I agree with the above.  Brendolyn Patty MD

## 2022-07-03 NOTE — Patient Instructions (Addendum)
Start Finasteride 5mg  tablet - take 1/2 tablet by mouth every day. Start Rogaine 5% nightly to scalp.  Recommend donut/travel pillow while sleeping to relieve pressure on the left ear.   Start Effaclar 0.1% Gel - Apply to bumps on chin every night until improved.   Cryotherapy Aftercare  Wash gently with soap and water everyday.   Apply Vaseline and Band-Aid daily until healed.      Androgenetic alopecia  Androgenetic alopecia the most common cause of hair loss. This hair loss is genetically programmed. Men may see a receding hair line or thinning or baldness on the top of their scalp. Women tend to have a widening part and loss of hair at the top of their scalp. Men may take oral medication such as Propecia or may use a topical medication like minoxidil (Rogaine) to treat the condition. Women may also use the topical medication minoxidil. Some physicians may treat women with spironolactone, a fluid pill, which can lower female hormone levels subsequently causing regrowth of hair or slowing of the hair loss.   Due to recent changes in healthcare laws, you may see results of your pathology and/or laboratory studies on MyChart before the doctors have had a chance to review them. We understand that in some cases there may be results that are confusing or concerning to you. Please understand that not all results are received at the same time and often the doctors may need to interpret multiple results in order to provide you with the best plan of care or course of treatment. Therefore, we ask that you please give [place] 2 business days to thoroughly review all your results before contacting the office for clarification. Should we see a critical lab result, you will be contacted sooner.   If You Need Anything After Your Visit  If you have any questions or concerns for your doctor, please call our main line at [phone] and press option 4 to reach your doctor's medical assistant. If no one answers,  please leave a voicemail as directed and we will return your call as soon as possible. Messages left after 4 pm will be answered the following business day.   You may also send [place] a message via [place]. We typically respond to MyChart messages within 1-2 business days.  For prescription refills, please ask your pharmacy to contact our office. Our fax number is [phone].  If you have an urgent issue when the clinic is closed that cannot wait until the next business day, you can page your doctor at the number below.    Please note that while we do our best to be available for urgent issues outside of office hours, we are not available 24/7.   If you have an urgent issue and are unable to reach [place], you may choose to seek medical care at your doctor's office, retail clinic, urgent care center, or emergency room.  If you have a medical emergency, please immediately call 911 or go to the emergency department.  Pager Numbers  - Dr. Nehemiah Massed: [phone]  - Dr. Laurence Ferrari: [phone]  - Dr. Nicole Kindred: [phone]  In the event of inclement weather, please call our main line at [phone] for an update on the status of any delays or closures.  Dermatology Medication Tips: Please keep the boxes that topical medications come in in order to help keep track of the instructions about where and how to use these. Pharmacies typically print the medication instructions only on the boxes and not directly on  the medication tubes.   If your medication is too expensive, please contact our office at [phone] option 4 or send [place] a message through [place].   We are unable to tell what your co-pay for medications will be in advance as this is different depending on your insurance coverage. However, we may be able to find a substitute medication at lower cost or fill out paperwork to get insurance to cover a needed medication.   If a prior authorization is required to get your medication covered by your  insurance company, please allow [place] 1-2 business days to complete this process.  Drug prices often vary depending on where the prescription is filled and some pharmacies may offer cheaper prices.  The website www.goodrx.com contains coupons for medications through different pharmacies. The prices here do not account for what the cost may be with help from insurance (it may be cheaper with your insurance), but the website can give you the price if you did not use any insurance.  - You can print the associated coupon and take it with your prescription to the pharmacy.  - You may also stop by our office during regular business hours and pick up a GoodRx coupon card.  - If you need your prescription sent electronically to a different pharmacy, notify our office through [facility] or by phone at [phone] option 4.     Si Usted Necesita Algo Despus de Su Visita  Tambin puede enviarnos un mensaje a travs de Pharmacist, community. Por lo general respondemos a los mensajes de MyChart en el transcurso de 1 a 2 das hbiles.  Para renovar recetas, por favor pida a su farmacia que se ponga en contacto con nuestra oficina. Harland Dingwall de fax es [place] [phone].  Si tiene un asunto urgente cuando la clnica est cerrada y que no puede esperar hasta el siguiente da hbil, puede llamar/localizar a su doctor(a) al nmero que aparece a continuacin.   Por favor, tenga en cuenta que aunque hacemos todo lo posible para estar disponibles para asuntos urgentes fuera del horario de [place], no estamos disponibles las 24 horas del da, los 7 das de la [place].   Si tiene un problema urgente y no puede comunicarse con nosotros, puede optar por buscar atencin mdica  en el consultorio de su doctor(a), en una clnica privada, en un centro de atencin urgente o en una sala de emergencias.  Si tiene Engineering geologist, por favor llame inmediatamente al 911 o vaya a la sala de emergencias.  Nmeros de  bper  - Dr. Nehemiah Massed: [phone]  - Dra. Moye: [phone]  - Dra. Nicole Kindred: [phone]  En caso de inclemencias del [place], por favor llame a Johnsie Kindred principal al [phone] para una actualizacin sobre el [place] de cualquier retraso o cierre.  Consejos para la medicacin en dermatologa: Por favor, guarde las cajas en las que vienen los medicamentos de uso tpico para ayudarle a seguir las instrucciones sobre dnde y cmo usarlos. Las farmacias generalmente imprimen las instrucciones del medicamento slo en las cajas y no directamente en los tubos del [place].   Si su medicamento es muy caro, por favor, pngase en contacto con Zigmund Daniel llamando al [phone] y presione la opcin 4 o envenos un mensaje a travs de Pharmacist, community.   No podemos decirle cul ser su copago por los medicamentos por adelantado ya que esto es diferente dependiendo de la cobertura de su seguro. Sin embargo, es posible que podamos encontrar un medicamento sustituto a  menor costo o llenar un formulario para que el seguro cubra el medicamento que se considera necesario.   Si se requiere una autorizacin previa para que su compaa de seguros [place] su medicamento, por favor permtanos de 1 a 2 das hbiles para completar este proceso.  Los precios de los medicamentos varan con frecuencia dependiendo del Environmental consultant de dnde se surte la receta y alguna farmacias pueden ofrecer precios ms baratos.  El sitio web www.goodrx.com tiene cupones para medicamentos de Airline pilot. Los precios aqu no tienen en cuenta lo que podra costar con la ayuda del seguro (puede ser ms barato con su seguro), pero el sitio web puede darle el precio si no utiliz Research scientist (physical sciences).  - Puede imprimir el cupn correspondiente y llevarlo con su receta a la farmacia.  - Tambin puede pasar por nuestra oficina durante el horario de atencin regular y Charity fundraiser una tarjeta de cupones de GoodRx.  - Si necesita que su receta se  enve electrnicamente a una farmacia diferente, informe a nuestra oficina a travs de MyChart de [place] o por telfono llamando al [phone] y presione la opcin 4.

## 2022-07-09 ENCOUNTER — Inpatient Hospital Stay: Payer: Medicare Other

## 2022-07-09 DIAGNOSIS — D631 Anemia in chronic kidney disease: Secondary | ICD-10-CM

## 2022-07-09 DIAGNOSIS — N183 Chronic kidney disease, stage 3 unspecified: Secondary | ICD-10-CM | POA: Diagnosis not present

## 2022-07-09 LAB — HEMOGLOBIN AND HEMATOCRIT, BLOOD
HCT: 31.4 % — ABNORMAL LOW (ref 36.0–46.0)
Hemoglobin: 10 g/dL — ABNORMAL LOW (ref 12.0–15.0)

## 2022-07-16 ENCOUNTER — Encounter: Payer: Self-pay | Admitting: Internal Medicine

## 2022-07-16 ENCOUNTER — Ambulatory Visit
Admission: RE | Admit: 2022-07-16 | Discharge: 2022-07-16 | Disposition: A | Discharge status: Home / Self Care | Payer: Medicare Other | Source: Ambulatory Visit | Attending: Internal Medicine | Admitting: Internal Medicine

## 2022-07-16 ENCOUNTER — Ambulatory Visit: Payer: Medicare Other | Admitting: Internal Medicine

## 2022-07-16 VITALS — BP 112/54 | HR 62 | Temp 97.5°F | Ht 60.0 in | Wt 130.8 lb

## 2022-07-16 DIAGNOSIS — M25559 Pain in unspecified hip: Secondary | ICD-10-CM

## 2022-07-16 DIAGNOSIS — D649 Anemia, unspecified: Secondary | ICD-10-CM

## 2022-07-16 DIAGNOSIS — E1159 Type 2 diabetes mellitus with other circulatory complications: Secondary | ICD-10-CM

## 2022-07-16 DIAGNOSIS — I10 Essential (primary) hypertension: Secondary | ICD-10-CM

## 2022-07-16 DIAGNOSIS — I779 Disorder of arteries and arterioles, unspecified: Secondary | ICD-10-CM

## 2022-07-16 DIAGNOSIS — N183 Chronic kidney disease, stage 3 unspecified: Secondary | ICD-10-CM | POA: Diagnosis not present

## 2022-07-16 DIAGNOSIS — E7800 Pure hypercholesterolemia, unspecified: Secondary | ICD-10-CM

## 2022-07-16 DIAGNOSIS — D696 Thrombocytopenia, unspecified: Secondary | ICD-10-CM

## 2022-07-16 DIAGNOSIS — I25119 Atherosclerotic heart disease of native coronary artery with unspecified angina pectoris: Secondary | ICD-10-CM

## 2022-07-16 DIAGNOSIS — I952 Hypotension due to drugs: Secondary | ICD-10-CM

## 2022-07-16 DIAGNOSIS — I4891 Unspecified atrial fibrillation: Secondary | ICD-10-CM | POA: Diagnosis not present

## 2022-07-16 DIAGNOSIS — I700 Atherosclerosis of aorta: Secondary | ICD-10-CM

## 2022-07-16 DIAGNOSIS — Z1231 Encounter for screening mammogram for malignant neoplasm of breast: Secondary | ICD-10-CM | POA: Diagnosis present

## 2022-07-16 LAB — LIPID PANEL
Cholesterol: 131 mg/dL (ref 0–200)
HDL: 62.7 mg/dL (ref >39.00)
LDL Cholesterol: 45 mg/dL (ref 0–99)
NonHDL: 68.75
Total CHOL/HDL Ratio: 2
Triglycerides: 120 mg/dL (ref 0.0–149.0)
VLDL: 24 mg/dL (ref 0.0–40.0)

## 2022-07-16 LAB — HEPATIC FUNCTION PANEL
ALT: 26 U/L (ref 0–35)
AST: 26 U/L (ref 0–37)
Albumin: 3.9 g/dL (ref 3.5–5.2)
Alkaline Phosphatase: 45 U/L (ref 39–117)
Bilirubin, Direct: 0.1 mg/dL (ref 0.0–0.3)
Total Bilirubin: 0.3 mg/dL (ref 0.2–1.2)
Total Protein: 6.3 g/dL (ref 6.0–8.3)

## 2022-07-16 LAB — MICROALBUMIN / CREATININE URINE RATIO
Creatinine,U: 39.6 mg/dL
Microalb Creat Ratio: 32.2 mg/g — ABNORMAL HIGH (ref 0.0–30.0)
Microalb, Ur: 12.7 mg/dL — ABNORMAL HIGH (ref 0.0–1.9)

## 2022-07-16 LAB — BASIC METABOLIC PANEL
BUN: 21 mg/dL (ref 6–23)
CO2: 26 mEq/L (ref 19–32)
Calcium: 9.3 mg/dL (ref 8.4–10.5)
Chloride: 106 mEq/L (ref 96–112)
Creatinine, Ser: 1.28 mg/dL — ABNORMAL HIGH (ref 0.40–1.20)
GFR: 37.51 mL/min — ABNORMAL LOW (ref >60.00)
Glucose, Bld: 91 mg/dL (ref 70–99)
Potassium: 4.2 mEq/L (ref 3.5–5.1)
Sodium: 140 mEq/L (ref 135–145)

## 2022-07-16 LAB — HEMOGLOBIN A1C: Hgb A1c MFr Bld: 5.3 % (ref 4.6–6.5)

## 2022-07-16 LAB — TSH: TSH: 1.82 u[IU]/mL (ref 0.35–5.50)

## 2022-07-16 NOTE — Progress Notes (Unsigned)
Patient ID: Jocelyn Elliott, female   DOB: February 26, 1932, 88 y.o.   MRN: [id number]   Subjective:    Patient ID: Jocelyn Elliott, female    DOB: 1932/08/27, 88 y.o.   MRN: [id number]   Patient here for No chief complaint on file.  Marland Kitchen   HPI Here to follow up regarding her diabetes, hypertension and anemia.  Sees hematology - last visit 06/25/22 - f/u anemia.  Receives IV iron infusions and b12.  She is accompanied by her daughter.  History obtained from both of them.     Past Medical History:  Diagnosis Date   Arthritis    AV malformation of gastrointestinal tract    Blood in stool    CAD (coronary artery disease)    Carotid arterial disease (HCC)    Cataracts, bilateral    Chronic blood loss anemia    Chronic cystitis    CKD (chronic kidney disease), stage III (HCC)    Complication of anesthesia    reaction to propofol - caused heart to pause   Depression    Diabetes mellitus (HCC)    GERD (gastroesophageal reflux disease)    Hyperlipidemia    Hypertension    IDA (iron deficiency anemia)    Stress incontinence    TIA (transient ischemic attack) 03/2021   No deficits   Wears dentures    full upper and lower   Past Surgical History:  Procedure Laterality Date   ABDOMINAL HYSTERECTOMY  11/19/1970   ovaries left in place   APPENDECTOMY  11/19/1990   BACK SURGERY  06/08/2010   L3, L4, L5   CARDIAC CATHETERIZATION  02/2001   CAROTID ARTERY ANGIOPLASTY  09/19/2002   CARPAL TUNNEL RELEASE  1991, 92   right then left    CATARACT EXTRACTION W/PHACO Left 02/13/2022   Procedure: CATARACT EXTRACTION PHACO AND INTRAOCULAR LENS PLACEMENT ([place]) LEFT DIABETIC 14.90 01:26.8;  Surgeon: Birder Robson, MD;  Location: [facility];  Service: Ophthalmology;  Laterality: Left;  Diabetic   CATARACT EXTRACTION W/PHACO Right 03/06/2022   Procedure: CATARACT EXTRACTION PHACO AND INTRAOCULAR LENS PLACEMENT (IOC) RIGHT DIABETIC 9.66 00:53.7;  Surgeon: Birder Robson, MD;  Location: [facility];  Service: Ophthalmology;  Laterality: Right;  Diabetic   CHOLECYSTECTOMY  11/20/1991   COLONOSCOPY WITH PROPOFOL N/A 02/02/2019   Procedure: COLONOSCOPY WITH PROPOFOL;  Surgeon: Manya Silvas, MD;  Location[facility] ENDOSCOPY;  Service: Endoscopy;  Laterality: N/A;   ESOPHAGOGASTRODUODENOSCOPY N/A 02/02/2019   Procedure: ESOPHAGOGASTRODUODENOSCOPY (EGD);  Surgeon: Manya Silvas, MD;  Location[facility] ENDOSCOPY;  Service: Endoscopy;  Laterality: N/A;   FOOT SURGERY  06/20/2007   Left   right carotid artery surgery  01/09/2013   Dr. Delana Meyer @ [facility]   TOTAL HIP ARTHROPLASTY  05/03/2011   left 12, right 11/07   Family History  Problem Relation Age of Onset   Hyperlipidemia Father    Heart disease Father        myocardial infarction   Hypertension Father        Parent   Arthritis Other        parent   Diabetes Other        nephew   Cervical cancer Sister    Rectal cancer Sister    Breast cancer Neg Hx    Social History   Socioeconomic History   Marital status: Widowed    Spouse name: Not on file   Number of children: 1   Years of education: 13   Highest  education level: Not on file  Occupational History   Occupation: Retired  Tobacco Use   Smoking status: Former    Types: Cigarettes    Quit date: 11/19/1989    Years since quitting: 32.6   Smokeless tobacco: Never  Vaping Use   Vaping Use: Never used  Substance and Sexual Activity   Alcohol use: No    Alcohol/week: 0.0 standard drinks of alcohol   Drug use: No   Sexual activity: Not Currently  Other Topics Concern   Not on file  Social History Narrative   Regular exercise-no   Caffeine Use-yes   Social Determinants of Health   Financial Resource Strain: Low Risk  (03/27/2022)   Overall Financial Resource Strain (CARDIA)    Difficulty of Paying Living Expenses: Not hard at all  Food Insecurity: No Food Insecurity (03/27/2022)   Hunger Vital Sign    Worried About Running Out of Food in the Last Year:  Never true    Ran Out of Food in the Last Year: Never true  Transportation Needs: No Transportation Needs (03/27/2022)   PRAPARE - Hydrologist (Medical): No    Lack of Transportation (Non-Medical): No  Physical Activity: Not on file  Stress: No Stress Concern Present (03/27/2022)   [place]    Feeling of Stress : Not at all  Social Connections: Unknown (03/27/2022)   Social Connection and Isolation Panel [NHANES]    Frequency of Communication with Friends and Family: More than three times a week    Frequency of Social Gatherings with Friends and Family: Three times a week    Attends Religious Services: Not on file    Active Member of Clubs or Organizations: Not on file    Attends Club or Organization Meetings: Not on file    Marital Status: Widowed     Review of Systems     Objective:     There were no vitals taken for this visit. Wt Readings from Last 3 Encounters:  06/25/22 131 lb 14.4 oz (59.8 kg)  05/21/22 139 lb 8 oz (63.3 kg)  04/23/22 137 lb (62.1 kg)    Physical Exam   Outpatient Encounter Medications as of 07/16/2022  Medication Sig   aspirin 81 MG tablet Take 81 mg by mouth daily.   Biotin 5000 MCG CAPS Take 1 capsule by mouth daily.   blood glucose meter kit and supplies KIT Dispense based on insurance preference. Use daily to check sugars once daily. Dx e11.9   cetirizine (ZYRTEC) 10 MG tablet Take 10 mg by mouth daily.   docusate sodium (COLACE) 100 MG capsule Take 100 mg by mouth daily as needed for mild constipation.   ferrous sulfate 325 (65 FE) MG EC tablet Take 325 mg by mouth daily with breakfast. Every other day   finasteride (PROSCAR) 5 MG tablet Take 1 tablet (5 mg total) by mouth daily.   guaiFENesin (MUCINEX) 600 MG 12 hr tablet Take by mouth 2 (two) times daily as needed.   hydrocortisone 2.5 % cream Apply once to twice daily to skin folds as directed.    isosorbide mononitrate (IMDUR) 30 MG 24 hr tablet Take 30 mg by mouth daily. (Patient not taking: Reported on 06/25/2022)   ketoconazole (NIZORAL) 2 % cream Apply once to twice daily to skin folds for rash as directed.   ketoconazole (NIZORAL) 2 % shampoo Massage into scalp and let sit several minutes before rinsing. Use  2-3 times a week.   ketorolac (ACULAR) 0.5 % ophthalmic solution SMARTSIG:In Eye(s) (Patient not taking: Reported on 04/23/2022)   Lancets (ONETOUCH DELICA PLUS [id number]) MISC USE AS INSTRUCTED TO CHECK BLOOD SUGARS ONCE DAILY. DX E 11.9   metFORMIN (GLUCOPHAGE) 500 MG tablet TAKE 1 TABLET BY MOUTH EVERY DAY WITH BREAKFAST   Omega-3 Fatty Acids (FISH OIL) 1200 MG CAPS Take 1 capsule by mouth daily.   ONETOUCH ULTRA test strip USE AS INSTRUCTED TO CHECK BLOOD SUGARS ONCE DAILY. DX E11.9   prednisoLONE acetate (PRED FORTE) 1 % ophthalmic suspension SMARTSIG:In Eye(s) (Patient not taking: Reported on 04/23/2022)   Probiotic, Lactobacillus, CAPS    rosuvastatin (CRESTOR) 40 MG tablet TAKE 1 TABLET BY MOUTH EVERY DAY   traZODone (DESYREL) 50 MG tablet TAKE 1/2 TO 1 TABLET BY MOUTH AT BEDTIME AS NEEDED FOR SLEEP   No facility-administered encounter medications on file as of 07/16/2022.     Lab Results  Component Value Date   WBC 6.8 06/25/2022   HGB 10.0 (L) 07/09/2022   HCT 31.4 (L) 07/09/2022   PLT 208 06/25/2022   GLUCOSE 78 06/25/2022   CHOL 140 02/07/2021   TRIG 111.0 02/07/2021   HDL 67.20 02/07/2021   LDLDIRECT 51.0 04/14/2019   LDLCALC 50 02/07/2021   ALT 29 02/07/2021   AST 29 02/07/2021   NA 137 06/25/2022   K 3.9 06/25/2022   CL 104 06/25/2022   CREATININE 1.36 (H) 06/25/2022   BUN 34 (H) 06/25/2022   CO2 24 06/25/2022   TSH 1.97 02/07/2021   INR 1.0 06/15/2019   HGBA1C 6.4 02/07/2021   MICROALBUR 32.8 (H) 02/25/2020       Assessment & Plan:   Problem List Items Addressed This Visit   None    Einar Pheasant, MD

## 2022-07-17 ENCOUNTER — Encounter: Payer: Self-pay | Admitting: *Deleted

## 2022-07-17 ENCOUNTER — Encounter: Payer: Self-pay | Admitting: Internal Medicine

## 2022-07-17 NOTE — Assessment & Plan Note (Signed)
Off imdur due to low blood pressure.  Remains on crestor.  Currently without symptoms.  Follow

## 2022-07-17 NOTE — Assessment & Plan Note (Signed)
Follow cbc.  Being followed by hematology.  °

## 2022-07-17 NOTE — Assessment & Plan Note (Signed)
Continue crestor.  Low cholesterol diet and exercise. Follow lipid panel and liver function tests.   

## 2022-07-17 NOTE — Assessment & Plan Note (Signed)
Blood pressure here wnl.  Outside readings reviewed - lower.  No dizziness.  No light headedness.  Stay hydrated.  Follow.

## 2022-07-17 NOTE — Assessment & Plan Note (Signed)
Continues on aspirin.  Denies any increased heart rate or palpitations.  Follow.

## 2022-07-17 NOTE — Assessment & Plan Note (Signed)
Followed by hematology.  History of AVM.  Also with CKD.  Receiving IV iron.

## 2022-07-17 NOTE — Assessment & Plan Note (Signed)
Continue crestor 

## 2022-07-17 NOTE — Assessment & Plan Note (Signed)
Dr Delana Meyer 08/2021:  Duplex ultrasound shows<50%stenosis bilaterally.  Continue antiplatelet therapy as prescribed Continue management of CAD, HTN and Hyperlipidemia  Follow up in6months with duplex ultrasound and physical exam

## 2022-07-17 NOTE — Assessment & Plan Note (Signed)
Continue to avoid antiinflammatories.  Stay hydrated.  Follow metabolic panel. Off ace inhibitor due to low blood pressure.

## 2022-07-17 NOTE — Assessment & Plan Note (Signed)
Sugars have been under reasonable control.  Outside sugars reviewed.  Most range - 90-140.  Follow.  Continues on metformin.

## 2022-07-17 NOTE — Assessment & Plan Note (Signed)
Chronic.  Stable.  Notify me if desires any further intervention.

## 2022-07-25 ENCOUNTER — Ambulatory Visit: Payer: Medicare Other

## 2022-07-25 ENCOUNTER — Ambulatory Visit: Payer: Medicare Other | Admitting: Internal Medicine

## 2022-07-25 ENCOUNTER — Other Ambulatory Visit: Payer: Medicare Other

## 2022-07-28 ENCOUNTER — Other Ambulatory Visit: Payer: Self-pay | Admitting: Dermatology

## 2022-07-28 DIAGNOSIS — L649 Androgenic alopecia, unspecified: Secondary | ICD-10-CM

## 2022-08-03 ENCOUNTER — Other Ambulatory Visit: Payer: Medicare Other

## 2022-08-03 ENCOUNTER — Ambulatory Visit: Payer: Medicare Other

## 2022-08-03 ENCOUNTER — Ambulatory Visit: Payer: Medicare Other | Admitting: Internal Medicine

## 2022-08-07 ENCOUNTER — Telehealth: Payer: Self-pay | Admitting: Internal Medicine

## 2022-08-07 NOTE — Telephone Encounter (Signed)
Patient called and said that Dr Nicki Reaper took her off of metFORMIN (GLUCOPHAGE) 500 MG tablet, since being off medication her blood sugar has been running high. The lowest has been is 138 this mooring and the highest was Monday at 11am  233.

## 2022-08-07 NOTE — Telephone Encounter (Signed)
Last a1c 5.3.  can she check and record her sugars over the next couple of weeks and send in readings.  Would like to review overall readings and can better determine treatment.

## 2022-08-16 ENCOUNTER — Other Ambulatory Visit: Payer: Self-pay

## 2022-08-16 DIAGNOSIS — N183 Chronic kidney disease, stage 3 unspecified: Secondary | ICD-10-CM

## 2022-08-16 MED FILL — Ferumoxytol Inj 510 MG/17ML (30 MG/ML) (Elemental Fe): INTRAVENOUS | Qty: 17 | Status: AC

## 2022-08-17 ENCOUNTER — Encounter: Payer: Self-pay | Admitting: Internal Medicine

## 2022-08-17 ENCOUNTER — Inpatient Hospital Stay (HOSPITAL_BASED_OUTPATIENT_CLINIC_OR_DEPARTMENT_OTHER): Payer: Medicare Other | Admitting: Internal Medicine

## 2022-08-17 ENCOUNTER — Inpatient Hospital Stay: Payer: Medicare Other

## 2022-08-17 ENCOUNTER — Inpatient Hospital Stay: Payer: Medicare Other | Attending: Internal Medicine

## 2022-08-17 VITALS — BP 132/62 | HR 87 | Resp 18

## 2022-08-17 DIAGNOSIS — N183 Chronic kidney disease, stage 3 unspecified: Secondary | ICD-10-CM | POA: Diagnosis present

## 2022-08-17 DIAGNOSIS — D631 Anemia in chronic kidney disease: Secondary | ICD-10-CM

## 2022-08-17 DIAGNOSIS — G459 Transient cerebral ischemic attack, unspecified: Secondary | ICD-10-CM | POA: Insufficient documentation

## 2022-08-17 DIAGNOSIS — D508 Other iron deficiency anemias: Secondary | ICD-10-CM

## 2022-08-17 LAB — CBC WITH DIFFERENTIAL/PLATELET
Abs Immature Granulocytes: 0.02 10*3/uL (ref 0.00–0.07)
Basophils Absolute: 0.1 10*3/uL (ref 0.0–0.1)
Basophils Relative: 1 %
Eosinophils Absolute: 0.2 10*3/uL (ref 0.0–0.5)
Eosinophils Relative: 3 %
HCT: 26 % — ABNORMAL LOW (ref 36.0–46.0)
Hemoglobin: 8.2 g/dL — ABNORMAL LOW (ref 12.0–15.0)
Immature Granulocytes: 0 %
Lymphocytes Relative: 23 %
Lymphs Abs: 1.4 10*3/uL (ref 0.7–4.0)
MCH: 34 pg (ref 26.0–34.0)
MCHC: 31.5 g/dL (ref 30.0–36.0)
MCV: 107.9 fL — ABNORMAL HIGH (ref 80.0–100.0)
Monocytes Absolute: 0.4 10*3/uL (ref 0.1–1.0)
Monocytes Relative: 7 %
Neutro Abs: 4 10*3/uL (ref 1.7–7.7)
Neutrophils Relative %: 66 %
Platelets: 158 10*3/uL (ref 150–400)
RBC: 2.41 MIL/uL — ABNORMAL LOW (ref 3.87–5.11)
RDW: 14.4 % (ref 11.5–15.5)
WBC: 6.1 10*3/uL (ref 4.0–10.5)
nRBC: 0 % (ref 0.0–0.2)

## 2022-08-17 LAB — BASIC METABOLIC PANEL
Anion gap: 4 — ABNORMAL LOW (ref 5–15)
BUN: 27 mg/dL — ABNORMAL HIGH (ref 8–23)
CO2: 25 mmol/L (ref 22–32)
Calcium: 8.9 mg/dL (ref 8.9–10.3)
Chloride: 107 mmol/L (ref 98–111)
Creatinine, Ser: 1.29 mg/dL — ABNORMAL HIGH (ref 0.44–1.00)
GFR, Estimated: 40 mL/min — ABNORMAL LOW (ref >60)
Glucose, Bld: 102 mg/dL — ABNORMAL HIGH (ref 70–99)
Potassium: 4.1 mmol/L (ref 3.5–5.1)
Sodium: 136 mmol/L (ref 135–145)

## 2022-08-17 LAB — IRON AND TIBC
Iron: 48 ug/dL (ref 28–170)
Saturation Ratios: 13 % (ref 10.4–31.8)
TIBC: 358 ug/dL (ref 250–450)
UIBC: 310 ug/dL

## 2022-08-17 LAB — FERRITIN: Ferritin: 30 ng/mL (ref 11–307)

## 2022-08-17 MED ORDER — SODIUM CHLORIDE 0.9 % IV SOLN
510.0000 mg | Freq: Once | INTRAVENOUS | Status: AC
  Administered 2022-08-17: 510 mg via INTRAVENOUS
  Filled 2022-08-17: qty 17

## 2022-08-17 MED ORDER — SODIUM CHLORIDE 0.9 % IV SOLN
Freq: Once | INTRAVENOUS | Status: AC
  Filled 2022-08-17: qty 250

## 2022-08-17 NOTE — Assessment & Plan Note (Addendum)
#  Severe anemia hemoglobin-nadir 6 [summer 2020]. Likely CKD/iron deficiency [July 2021- Bone marrow biopsy-mild dyserythropoietic changes]. [place] 2023- NEGATIVE for hemolysis [haptoglobin-WNL]  # Today hemoglobin is  8.2; . continue Feraheme monthly. FEB 2023- sat- 14%; ferritin 30.  HOLD retacrit today.    # B12 def- FEB 2023- 232; start B12 injections every monthly.Luetta Nutting 2023]   # CKD stage III-1.5- GFR- 40s STABLE;  Recommend continued p.o. intake.; STABLE  # TIA: on plavix [Dr.Shah] on PT.  # DISPOSITION:  # Ferrahem today; HOLD b12 injection # in 2 weeks-ferrahem # in 4 weeks-  MD-  labs-  labs-cbc;bmp;B12 levels; possible Ferrahem or possible retacrit; B12 injection- Dr.B

## 2022-08-17 NOTE — Patient Instructions (Signed)

## 2022-08-17 NOTE — Progress Notes (Signed)
Patient stays cold.

## 2022-08-17 NOTE — Progress Notes (Signed)
[facility] OFFICE PROGRESS NOTE  Patient Care Team: Einar Pheasant, MD as PCP - General (Internal Medicine) Einar Pheasant, MD (Internal Medicine) Isaias Cowman, MD (Internal Medicine) Brendolyn Patty, MD (Specialist) Schnier, Dolores Lory, MD (Vascular Surgery) Isaias Cowman, MD (Internal Medicine) Brendolyn Patty, MD (Specialist) Schnier, Dolores Lory, MD (Vascular Surgery) Cammie Sickle, MD as Consulting Physician (Oncology)   SUMMARY OF HEMATOLOGIC HISTORY:  # IRON DEFICIENCY ANEMIA- recurrent [? GI blood loss; Dr.Elliot; AVM-capsule]; IV Ferrahem q 75m last colo- 2012/march; EGD-- [id number]  July 2020 bone marrow biopsy-mild dysplastic changes; 40% hypercellularity; cytogenetics-normal.-Retacrit.MARCH 2023- Foundation One Hem- TP53 subclonal. B12 def- FEB 2023- Start B12 injections Monthly; MAY 3rd, 2023- Start Retacrit 20K [place].   #Colonoscopy-April 2020 aborted because of cardiac pause.  # CKD stage III-IV; CAD-Dr. PSaralyn Pilar  March 30th, 2022-TIA/slurred spleech [Dr.Shah]  INTERVAL HISTORY: In a wheelchair.  Alone.    88-year-old female patient with above history of recurrent iron deficiency anemia unclear etiology/?-related to CKD-on Feraheme/Retacrit is here for follow-up.  Patient denies any blood in stools or black-colored stools. Patient denies any nausea vomiting. Patient admits to improvement of her fatigue .   Review of Systems  Constitutional:  Positive for malaise/fatigue. Negative for chills, diaphoresis, fever and weight loss.  HENT:  Negative for nosebleeds and sore throat.   Eyes:  Negative for double vision.  Respiratory:  Negative for cough, hemoptysis, sputum production and wheezing.   Cardiovascular:  Negative for palpitations, orthopnea and leg swelling.  Gastrointestinal:  Negative for abdominal pain, blood in stool, constipation, diarrhea, heartburn, melena, nausea and vomiting.  Genitourinary:  Negative for dysuria,  frequency and urgency.  Musculoskeletal:  Negative for back pain and joint pain.  Skin: Negative.  Negative for itching and rash.  Neurological:  Negative for dizziness, tingling, focal weakness, weakness and headaches.  Endo/Heme/Allergies:  Does not bruise/bleed easily.  Psychiatric/Behavioral:  Negative for depression. The patient is not nervous/anxious and does not have insomnia.      PAST MEDICAL HISTORY :  Past Medical History:  Diagnosis Date  . Arthritis   . AV malformation of gastrointestinal tract   . Blood in stool   . CAD (coronary artery disease)   . Carotid arterial disease ([place]   . Cataracts, bilateral   . Chronic blood loss anemia   . Chronic cystitis   . CKD (chronic kidney disease), stage III ([place]   . Complication of anesthesia    reaction to propofol - caused heart to pause  . Depression   . Diabetes mellitus ([place]   . GERD (gastroesophageal reflux disease)   . Hyperlipidemia   . Hypertension   . IDA (iron deficiency anemia)   . Stress incontinence   . TIA (transient ischemic attack) 03/2021   No deficits  . Wears dentures    full upper and lower    PAST SURGICAL HISTORY :   Past Surgical History:  Procedure Laterality Date  . ABDOMINAL HYSTERECTOMY  11/19/1970   ovaries left in place  . APPENDECTOMY  11/19/1990  . BACK SURGERY  06/08/2010   L3, L4, L5  . CARDIAC CATHETERIZATION  02/2001  . CAROTID ARTERY ANGIOPLASTY  09/19/2002  . [place]   right then left   . CATARACT EXTRACTION W/PHACO Left 02/13/2022   Procedure: CATARACT EXTRACTION PHACO AND INTRAOCULAR LENS PLACEMENT (IOC) LEFT DIABETIC 14.90 01:26.8;  Surgeon: PBirder Robson MD;  [place]  Service: Ophthalmology;  Laterality: Left;  Diabetic  .  CATARACT EXTRACTION W/PHACO Right 03/06/2022   Procedure: CATARACT EXTRACTION PHACO AND INTRAOCULAR LENS PLACEMENT (IOC) RIGHT DIABETIC 9.66 00:53.7;  Surgeon: Birder Robson, MD;  Location: [place];  Service: Ophthalmology;  Laterality: Right;  Diabetic  . CHOLECYSTECTOMY  11/20/1991  . COLONOSCOPY WITH PROPOFOL N/A 02/02/2019   Procedure: COLONOSCOPY WITH PROPOFOL;  Surgeon: Manya Silvas, MD;  Location: [facility] ENDOSCOPY;  Service: Endoscopy;  Laterality: N/A;  . ESOPHAGOGASTRODUODENOSCOPY N/A 02/02/2019   Procedure: ESOPHAGOGASTRODUODENOSCOPY (EGD);  Surgeon: Manya Silvas, MD;  Location: [facility] ENDOSCOPY;  Service: Endoscopy;  Laterality: N/A;  . FOOT SURGERY  06/20/2007   Left  . right carotid artery surgery  01/09/2013   Dr. Delana Meyer @ [facility]  . TOTAL HIP ARTHROPLASTY  05/03/2011   left 12, right 11/07    FAMILY HISTORY :   Family History  Problem Relation Age of Onset  . Hyperlipidemia Father   . Heart disease Father        myocardial infarction  . Hypertension Father        Parent  . Arthritis Other        parent  . Diabetes Other        nephew  . Cervical cancer Sister   . Rectal cancer Sister   . Breast cancer Neg Hx     SOCIAL HISTORY:   Social History   Tobacco Use  . Smoking status: Former    Types: Cigarettes    Quit date: 11/19/1989    Years since quitting: 32.7  . Smokeless tobacco: Never  Vaping Use  . Vaping Use: Never used  Substance Use Topics  . Alcohol use: No    Alcohol/week: 0.0 standard drinks of alcohol  . Drug use: No    ALLERGIES:  is allergic to propofol, ciprofloxacin, levaquin [levofloxacin], and tequin [gatifloxacin].  MEDICATIONS:  Current Outpatient Medications  Medication Sig Dispense Refill  . aspirin 81 MG tablet Take 81 mg by mouth daily.    . Biotin 5000 MCG CAPS Take 1 capsule by mouth daily.    . blood glucose meter kit and supplies KIT Dispense based on insurance preference. Use daily to check sugars once daily. Dx e11.9 1 each 0  . cetirizine (ZYRTEC) 10 MG tablet Take 10 mg by mouth daily.    Marland Kitchen docusate sodium (COLACE) 100 MG capsule Take 100 mg by mouth daily as needed for mild constipation.    .  ferrous sulfate 325 (65 FE) MG EC tablet Take 325 mg by mouth every other day. Every other day    . finasteride (PROSCAR) 5 MG tablet Take 1 tablet (5 mg total) by mouth daily. 30 tablet 1  . guaiFENesin (MUCINEX) 600 MG 12 hr tablet Take by mouth 2 (two) times daily as needed.    Marland Kitchen ketoconazole (NIZORAL) 2 % shampoo Massage into scalp and let sit several minutes before rinsing. Use 2-3 times a week. 120 mL 2  . Lancets (ONETOUCH DELICA PLUS YBOFBP10C) MISC USE AS INSTRUCTED TO CHECK BLOOD SUGARS ONCE DAILY. DX E 11.9 100 each 12  . metFORMIN (GLUCOPHAGE) 500 MG tablet TAKE 1 TABLET BY MOUTH EVERY DAY WITH BREAKFAST 90 tablet 1  . Omega-3 Fatty Acids (FISH OIL) 1200 MG CAPS Take 1 capsule by mouth daily.    Glory Rosebush ULTRA test strip USE AS INSTRUCTED TO CHECK BLOOD SUGARS ONCE DAILY. DX E11.9 100 strip 12  . Probiotic, Lactobacillus, CAPS     . rosuvastatin (CRESTOR) 40 MG tablet TAKE 1 TABLET  BY MOUTH EVERY DAY 90 tablet 1  . traZODone (DESYREL) 50 MG tablet TAKE 1/2 TO 1 TABLET BY MOUTH AT BEDTIME AS NEEDED FOR SLEEP 90 tablet 1   No current facility-administered medications for this visit.    PHYSICAL EXAMINATION:   BP (!) 104/59 (BP Location: Left Arm, Patient Position: Sitting)   Pulse 67   Temp 97.8 F (36.6 C) (Tympanic)   Resp 16   Wt 137 lb 12.8 oz (62.5 kg)   BMI 26.91 kg/m   Filed Weights   08/17/22 1300  Weight: 137 lb 12.8 oz (62.5 kg)     Physical Exam Constitutional:      Comments: Frail-appearing Caucasian female patient.  She is in a wheelchair.  HENT:     Head: Normocephalic and atraumatic.     Mouth/Throat:     Pharynx: No oropharyngeal exudate.  Eyes:     Pupils: Pupils are equal, round, and reactive to light.     Comments: Positive for pallor.  Cardiovascular:     Rate and Rhythm: Normal rate and regular rhythm.  Pulmonary:     Effort: No respiratory distress.     Breath sounds: No wheezing.  Abdominal:     General: Bowel sounds are normal.  There is no distension.     Palpations: Abdomen is soft. There is no mass.     Tenderness: There is no abdominal tenderness. There is no guarding or rebound.  Musculoskeletal:        General: No tenderness. Normal range of motion.     Cervical back: Normal range of motion and neck supple.  Skin:    General: Skin is warm.  Neurological:     Mental Status: She is alert and oriented to person, place, and time.  Psychiatric:        Mood and Affect: Affect normal.    LABORATORY DATA:  I have reviewed the data as listed    Component Value Date/Time   NA 136 08/17/2022 1306   NA 143 01/10/2013 0314   K 4.1 08/17/2022 1306   K 4.0 01/10/2013 0314   CL 107 08/17/2022 1306   CL 109 (H) 01/10/2013 0314   CO2 25 08/17/2022 1306   CO2 27 01/10/2013 0314   GLUCOSE 102 (H) 08/17/2022 1306   GLUCOSE 119 (H) 01/10/2013 0314   BUN 27 (H) 08/17/2022 1306   BUN 15 01/10/2013 0314   CREATININE 1.29 (H) 08/17/2022 1306   CREATININE 1.03 01/10/2013 0314   CREATININE 1.13 (H) 09/18/2012 0848   CALCIUM 8.9 08/17/2022 1306   CALCIUM 8.1 (L) 01/10/2013 0314   PROT 6.3 07/16/2022 1106   PROT 7.2 12/20/2011 1521   ALBUMIN 3.9 07/16/2022 1106   ALBUMIN 3.9 12/20/2011 1521   AST 26 07/16/2022 1106   AST 31 12/20/2011 1521   ALT 26 07/16/2022 1106   ALT 37 12/20/2011 1521   ALKPHOS 45 07/16/2022 1106   ALKPHOS 57 12/20/2011 1521   BILITOT 0.3 07/16/2022 1106   BILITOT 0.5 12/20/2011 1521   GFRNONAA 40 (L) 08/17/2022 1306   GFRNONAA 52 (L) 01/10/2013 0314   GFRNONAA 47 (L) 09/18/2012 0848   GFRAA 46 (L) 07/12/2020 1240   GFRAA >60 01/10/2013 0314   GFRAA 54 (L) 09/18/2012 0848    No results found for: "SPEP", "UPEP"  Lab Results  Component Value Date   WBC 6.1 08/17/2022   NEUTROABS 4.0 08/17/2022   HGB 8.2 (L) 08/17/2022   HCT 26.0 (L) 08/17/2022   MCV 107.9 (  H) 08/17/2022   PLT 158 08/17/2022      Chemistry      Component Value Date/Time   NA 136 08/17/2022 1306   NA 143  01/10/2013 0314   K 4.1 08/17/2022 1306   K 4.0 01/10/2013 0314   CL 107 08/17/2022 1306   CL 109 (H) 01/10/2013 0314   CO2 25 08/17/2022 1306   CO2 27 01/10/2013 0314   BUN 27 (H) 08/17/2022 1306   BUN 15 01/10/2013 0314   CREATININE 1.29 (H) 08/17/2022 1306   CREATININE 1.03 01/10/2013 0314   CREATININE 1.13 (H) 09/18/2012 0848      Component Value Date/Time   CALCIUM 8.9 08/17/2022 1306   CALCIUM 8.1 (L) 01/10/2013 0314   ALKPHOS 45 07/16/2022 1106   ALKPHOS 57 12/20/2011 1521   AST 26 07/16/2022 1106   AST 31 12/20/2011 1521   ALT 26 07/16/2022 1106   ALT 37 12/20/2011 1521   BILITOT 0.3 07/16/2022 1106   BILITOT 0.5 12/20/2011 1521         ASSESSMENT & PLAN:   Anemia of chronic kidney failure, stage 3 (moderate) (HCC) #Severe anemia hemoglobin-nadir 6 [summer 2020]. Likely CKD/iron deficiency [July 2021- Bone marrow biopsy-mild dyserythropoietic changes]. [place] for hemolysis [haptoglobin-WNL]  # Today hemoglobin is 10.3. continue Feraheme monthly. FEB 2023- sat- 14%; ferritin 30.  HOLD retacrit today.    # B12 def- FEB 2023- 232; start B12 injections every monthly.Luetta Nutting 2023]   # CKD stage III-1.5- GFR- 40s STABLE;  Recommend continued p.o. intake.; STABLE  # TIA: on plavix [Dr.Shah] on PT.  # DISPOSITION:  # Ferrahem today; proceed with B12 injection; HOLD  retacrit today # in 2 weeks- H&H; possible retacrit # in 4 weeks-  NP- labs-  labs-cbc;bmp;iron studies;feritin; possible Ferrahem or possible retacrit; B12 injection- Dr.B       Cammie Sickle, MD 08/17/2022 2:21 PM

## 2022-08-19 NOTE — Progress Notes (Signed)
MRN : [id number]  KHYLEIGH FURNEY is a 88 y.o. (02-12-1932) female who presents with chief complaint of check carotid arteries.  History of Present Illness:  The patient is seen for follow up evaluation of carotid stenosis. The carotid stenosis followed by ultrasound.   She is s/p right CEA 01/09/2013 and Left CEA 10/08/2002.   The patient denies amaurosis fugax. There is no recent history of TIA symptoms or focal motor deficits. There is no prior documented CVA.   The patient is taking enteric-coated aspirin 81 mg daily.   There is no history of migraine headaches. There is no history of seizures.   The patient has a history of coronary artery disease, no recent episodes of angina or shortness of breath. The patient denies PAD or claudication symptoms. There is a history of hyperlipidemia which is being treated with a statin.     Carotid Duplex done today shows patent right CEA and 44-03% LICA restenosis.  No change compared to last study in 08/20/2021  No outpatient medications have been marked as taking for the 08/20/22 encounter (Appointment) with Delana Meyer, Dolores Lory, MD.    Past Medical History:  Diagnosis Date   Arthritis    AV malformation of gastrointestinal tract    Blood in stool    CAD (coronary artery disease)    Carotid arterial disease ([place])    Cataracts, bilateral    Chronic blood loss anemia    Chronic cystitis    CKD (chronic kidney disease), stage III (HCC)    Complication of anesthesia    reaction to propofol - caused heart to pause   Depression    Diabetes mellitus (HCC)    GERD (gastroesophageal reflux disease)    Hyperlipidemia    Hypertension    IDA (iron deficiency anemia)    Stress incontinence    TIA (transient ischemic attack) 03/2021   No deficits   Wears dentures    full upper and lower    Past Surgical History:  Procedure Laterality Date   ABDOMINAL HYSTERECTOMY  11/19/1970   ovaries left in place   APPENDECTOMY  11/19/1990    BACK SURGERY  06/08/2010   L3, L4, L5   CARDIAC CATHETERIZATION  02/2001   CAROTID ARTERY ANGIOPLASTY  09/19/2002   CARPAL TUNNEL RELEASE  1991, 92   right then left    CATARACT EXTRACTION W/PHACO Left 02/13/2022   Procedure: CATARACT EXTRACTION PHACO AND INTRAOCULAR LENS PLACEMENT ([place]) LEFT DIABETIC 14.90 01:26.8;  Surgeon: Birder Robson, MD;  Location: [place];  Service: Ophthalmology;  Laterality: Left;  Diabetic   CATARACT EXTRACTION W/PHACO Right 03/06/2022   Procedure: CATARACT EXTRACTION PHACO AND INTRAOCULAR LENS PLACEMENT (IOC) RIGHT DIABETIC 9.66 00:53.7;  Surgeon: Birder Robson, MD;  Location: [place];  Service: Ophthalmology;  Laterality: Right;  Diabetic   CHOLECYSTECTOMY  11/20/1991   COLONOSCOPY WITH PROPOFOL N/A 02/02/2019   Procedure: COLONOSCOPY WITH PROPOFOL;  Surgeon: Manya Silvas, MD;  Location[facility] ENDOSCOPY;  Service: Endoscopy;  Laterality: N/A;   ESOPHAGOGASTRODUODENOSCOPY N/A 02/02/2019   Procedure: ESOPHAGOGASTRODUODENOSCOPY (EGD);  Surgeon: Manya Silvas, MD;  [facility]COPY;  Service: Endoscopy;  Laterality: N/A;   FOOT SURGERY  06/20/2007   Left   right carotid artery surgery  01/09/2013   Dr. Delana Meyer @ [facility]   TOTAL HIP ARTHROPLASTY  05/03/2011   left 12, right 11/07    Social History Social History   Tobacco Use   Smoking status: Former  Types: Cigarettes    Quit date: 11/19/1989    Years since quitting: 32.7   Smokeless tobacco: Never  Vaping Use   Vaping Use: Never used  Substance Use Topics   Alcohol use: No    Alcohol/week: 0.0 standard drinks of alcohol   Drug use: No    Family History Family History  Problem Relation Age of Onset   Hyperlipidemia Father    Heart disease Father        myocardial infarction   Hypertension Father        Parent   Arthritis Other        parent   Diabetes Other        nephew   Cervical cancer Sister    Rectal cancer Sister    Breast cancer Neg Hx      Allergies  Allergen Reactions   Propofol Other (See Comments)    Caused heart to pause   Ciprofloxacin Rash   Levaquin [Levofloxacin] Rash   Tequin [Gatifloxacin] Hives and Rash     REVIEW OF SYSTEMS (Negative unless checked)  Constitutional: [] Weight loss  [] Fever  [] Chills Cardiac: [] Chest pain   [] Chest pressure   [] Palpitations   [] Shortness of breath when laying flat   [] Shortness of breath with exertion. Vascular:  [x] Pain in legs with walking   [] Pain in legs at rest  [] History of DVT   [] Phlebitis   [] Swelling in legs   [] Varicose veins   [] Non-healing ulcers Pulmonary:   [] Uses home oxygen   [] Productive cough   [] Hemoptysis   [] Wheeze  [] COPD   [] Asthma Neurologic:  [] Dizziness   [] Seizures   [] History of stroke   [] History of TIA  [] Aphasia   [] Vissual changes   [] Weakness or numbness in arm   [] Weakness or numbness in leg Musculoskeletal:   [] Joint swelling   [] Joint pain   [] Low back pain Hematologic:  [] Easy bruising  [] Easy bleeding   [] Hypercoagulable state   [] Anemic Gastrointestinal:  [] Diarrhea   [] Vomiting  [] Gastroesophageal reflux/heartburn   [] Difficulty swallowing. Genitourinary:  [] Chronic kidney disease   [] Difficult urination  [] Frequent urination   [] Blood in urine Skin:  [] Rashes   [] Ulcers  Psychological:  [] History of anxiety   []  History of major depression.  Physical Examination  There were no vitals filed for this visit. There is no height or weight on file to calculate BMI. Gen: WD/WN, NAD Head: [place]/AT, No temporalis wasting.  Ear/Nose/Throat: Hearing grossly intact, nares w/o erythema or drainage Eyes: PER, EOMI, sclera nonicteric.  Neck: Supple, no masses.  No bruit or JVD.  Pulmonary:  Good air movement, no audible wheezing, no use of accessory muscles.  Cardiac: RRR, normal S1, S2, no Murmurs. Vascular:  carotid bruit noted Vessel Right Left  Radial Palpable Palpable  Carotid  Palpable  Palpable  Subclav  Palpable Palpable   Gastrointestinal: soft, non-distended. No guarding/no peritoneal signs.  Musculoskeletal: M/S 5/5 throughout.  No visible deformity.  Neurologic: CN 2-12 intact. Pain and light touch intact in extremities.  Symmetrical.  Speech is fluent. Motor exam as listed above. Psychiatric: Judgment intact, Mood & affect appropriate for pt's clinical situation. Dermatologic: No rashes or ulcers noted.  No changes consistent with cellulitis.   CBC Lab Results  Component Value Date   WBC 6.1 08/17/2022   HGB 8.2 (L) 08/17/2022   HCT 26.0 (L) 08/17/2022   MCV 107.9 (H) 08/17/2022   PLT 158 08/17/2022    BMET    Component Value Date/Time  NA 136 08/17/2022 1306   NA 143 01/10/2013 0314   K 4.1 08/17/2022 1306   K 4.0 01/10/2013 0314   CL 107 08/17/2022 1306   CL 109 (H) 01/10/2013 0314   CO2 25 08/17/2022 1306   CO2 27 01/10/2013 0314   GLUCOSE 102 (H) 08/17/2022 1306   GLUCOSE 119 (H) 01/10/2013 0314   BUN 27 (H) 08/17/2022 1306   BUN 15 01/10/2013 0314   CREATININE 1.29 (H) 08/17/2022 1306   CREATININE 1.03 01/10/2013 0314   CREATININE 1.13 (H) 09/18/2012 0848   CALCIUM 8.9 08/17/2022 1306   CALCIUM 8.1 (L) 01/10/2013 0314   GFRNONAA 40 (L) 08/17/2022 1306   GFRNONAA 52 (L) 01/10/2013 0314   GFRNONAA 47 (L) 09/18/2012 0848   GFRAA 46 (L) 07/12/2020 1240   GFRAA >60 01/10/2013 0314   GFRAA 54 (L) 09/18/2012 0848   Estimated Creatinine Clearance: 24.9 mL/min (A) (by C-G formula based on SCr of 1.29 mg/dL (H)).  COAG Lab Results  Component Value Date   INR 1.0 06/15/2019   INR 1.0 01/10/2013    Radiology No results found.   Assessment/Plan 1. Carotid artery disease, unspecified laterality, unspecified type ([place]) Recommend:  Given the patient's asymptomatic subcritical stenosis no further invasive testing or surgery at this time.  Duplex ultrasound shows RICA 8-03% and LICA 21-22% stenosis bilaterally s/p left CEA.  Continue antiplatelet therapy as  prescribed Continue management of CAD, HTN and Hyperlipidemia Healthy heart diet,  encouraged exercise at least 4 times per week Follow up in 12 months with duplex ultrasound and physical exam   - VAS US CAROTID - VAS US CAROTID; Future  2. Coronary artery disease involving native coronary artery of native heart with angina pectoris ([place]) Continue cardiac and antihypertensive medications as already ordered and reviewed, no changes at this time.  Continue statin as ordered and reviewed, no changes at this time  Nitrates PRN for chest pain   3. Primary hypertension Continue antihypertensive medications as already ordered, these medications have been reviewed and there are no changes at this time.   4. Type 2 diabetes mellitus with other circulatory complication, without long-term current use of insulin (HCC) Continue hypoglycemic medications as already ordered, these medications have been reviewed and there are no changes at this time.  Hgb A1C to be monitored as already arranged by primary service     Hortencia Pilar, MD  08/19/2022 4:24 PM

## 2022-08-20 ENCOUNTER — Encounter (INDEPENDENT_AMBULATORY_CARE_PROVIDER_SITE_OTHER): Payer: Medicare Other

## 2022-08-20 ENCOUNTER — Ambulatory Visit (INDEPENDENT_AMBULATORY_CARE_PROVIDER_SITE_OTHER): Payer: Medicare Other | Admitting: Vascular Surgery

## 2022-08-20 ENCOUNTER — Encounter (INDEPENDENT_AMBULATORY_CARE_PROVIDER_SITE_OTHER): Payer: Self-pay | Admitting: Vascular Surgery

## 2022-08-20 ENCOUNTER — Ambulatory Visit (INDEPENDENT_AMBULATORY_CARE_PROVIDER_SITE_OTHER): Payer: Medicare Other

## 2022-08-20 VITALS — BP 113/67 | HR 87 | Resp 17 | Ht 59.0 in | Wt 137.0 lb

## 2022-08-20 DIAGNOSIS — I25119 Atherosclerotic heart disease of native coronary artery with unspecified angina pectoris: Secondary | ICD-10-CM

## 2022-08-20 DIAGNOSIS — I779 Disorder of arteries and arterioles, unspecified: Secondary | ICD-10-CM

## 2022-08-20 DIAGNOSIS — E1159 Type 2 diabetes mellitus with other circulatory complications: Secondary | ICD-10-CM | POA: Diagnosis not present

## 2022-08-20 DIAGNOSIS — I10 Essential (primary) hypertension: Secondary | ICD-10-CM

## 2022-08-26 ENCOUNTER — Encounter (INDEPENDENT_AMBULATORY_CARE_PROVIDER_SITE_OTHER): Payer: Self-pay | Admitting: Vascular Surgery

## 2022-08-31 ENCOUNTER — Ambulatory Visit: Payer: Medicare Other

## 2022-09-03 ENCOUNTER — Inpatient Hospital Stay: Payer: Medicare Other | Attending: Internal Medicine

## 2022-09-03 ENCOUNTER — Telehealth: Payer: Self-pay | Admitting: Internal Medicine

## 2022-09-03 VITALS — BP 121/55 | HR 79 | Temp 97.3°F | Resp 16

## 2022-09-03 DIAGNOSIS — D508 Other iron deficiency anemias: Secondary | ICD-10-CM

## 2022-09-03 DIAGNOSIS — D631 Anemia in chronic kidney disease: Secondary | ICD-10-CM | POA: Insufficient documentation

## 2022-09-03 DIAGNOSIS — G459 Transient cerebral ischemic attack, unspecified: Secondary | ICD-10-CM | POA: Insufficient documentation

## 2022-09-03 DIAGNOSIS — Z7902 Long term (current) use of antithrombotics/antiplatelets: Secondary | ICD-10-CM | POA: Insufficient documentation

## 2022-09-03 DIAGNOSIS — N183 Chronic kidney disease, stage 3 unspecified: Secondary | ICD-10-CM | POA: Insufficient documentation

## 2022-09-03 MED ORDER — SODIUM CHLORIDE 0.9 % IV SOLN
510.0000 mg | Freq: Once | INTRAVENOUS | Status: AC
  Administered 2022-09-03: 510 mg via INTRAVENOUS
  Filled 2022-09-03: qty 510

## 2022-09-03 MED ORDER — SODIUM CHLORIDE 0.9 % IV SOLN
Freq: Once | INTRAVENOUS | Status: AC
  Filled 2022-09-03: qty 250

## 2022-09-03 NOTE — Telephone Encounter (Signed)
Reviewed sugar readings .  (08/17/22 - 09/03/22 - 120-150s).  Overall sugar readings are ok.  Continue to monitor.

## 2022-09-03 NOTE — Telephone Encounter (Signed)
Pt daughter came into the office to drop off pt sugar readings. Placed in provider folder

## 2022-09-03 NOTE — Telephone Encounter (Signed)
Readings have been placed in your records folder its in a small envelope

## 2022-09-04 NOTE — Telephone Encounter (Signed)
Pt has been informed and has verbalized understanding stated she keeps it recorded everyday.

## 2022-09-07 ENCOUNTER — Other Ambulatory Visit: Payer: Self-pay | Admitting: Internal Medicine

## 2022-09-10 ENCOUNTER — Encounter (INDEPENDENT_AMBULATORY_CARE_PROVIDER_SITE_OTHER): Payer: Medicare Other

## 2022-09-10 ENCOUNTER — Ambulatory Visit (INDEPENDENT_AMBULATORY_CARE_PROVIDER_SITE_OTHER): Payer: Medicare Other | Admitting: Vascular Surgery

## 2022-09-14 ENCOUNTER — Other Ambulatory Visit: Payer: Self-pay | Admitting: Internal Medicine

## 2022-09-17 ENCOUNTER — Encounter (INDEPENDENT_AMBULATORY_CARE_PROVIDER_SITE_OTHER): Payer: Self-pay

## 2022-09-17 MED FILL — Ferumoxytol Inj 510 MG/17ML (30 MG/ML) (Elemental Fe): INTRAVENOUS | Qty: 17 | Status: AC

## 2022-09-18 ENCOUNTER — Inpatient Hospital Stay: Payer: Medicare Other

## 2022-09-18 ENCOUNTER — Inpatient Hospital Stay (HOSPITAL_BASED_OUTPATIENT_CLINIC_OR_DEPARTMENT_OTHER): Payer: Medicare Other | Admitting: Internal Medicine

## 2022-09-18 ENCOUNTER — Encounter: Payer: Self-pay | Admitting: Internal Medicine

## 2022-09-18 DIAGNOSIS — D631 Anemia in chronic kidney disease: Secondary | ICD-10-CM

## 2022-09-18 DIAGNOSIS — N183 Chronic kidney disease, stage 3 unspecified: Secondary | ICD-10-CM

## 2022-09-18 DIAGNOSIS — D649 Anemia, unspecified: Secondary | ICD-10-CM

## 2022-09-18 LAB — CBC WITH DIFFERENTIAL/PLATELET
Abs Immature Granulocytes: 0.02 10*3/uL (ref 0.00–0.07)
Basophils Absolute: 0 10*3/uL (ref 0.0–0.1)
Basophils Relative: 1 %
Eosinophils Absolute: 0.2 10*3/uL (ref 0.0–0.5)
Eosinophils Relative: 3 %
HCT: 26 % — ABNORMAL LOW (ref 36.0–46.0)
Hemoglobin: 8.2 g/dL — ABNORMAL LOW (ref 12.0–15.0)
Immature Granulocytes: 0 %
Lymphocytes Relative: 18 %
Lymphs Abs: 1.1 10*3/uL (ref 0.7–4.0)
MCH: 36.1 pg — ABNORMAL HIGH (ref 26.0–34.0)
MCHC: 31.5 g/dL (ref 30.0–36.0)
MCV: 114.5 fL — ABNORMAL HIGH (ref 80.0–100.0)
Monocytes Absolute: 0.4 10*3/uL (ref 0.1–1.0)
Monocytes Relative: 7 %
Neutro Abs: 4.4 10*3/uL (ref 1.7–7.7)
Neutrophils Relative %: 71 %
Platelets: 158 10*3/uL (ref 150–400)
RBC: 2.27 MIL/uL — ABNORMAL LOW (ref 3.87–5.11)
RDW: 14.5 % (ref 11.5–15.5)
WBC: 6.1 10*3/uL (ref 4.0–10.5)
nRBC: 0 % (ref 0.0–0.2)

## 2022-09-18 LAB — BASIC METABOLIC PANEL
Anion gap: 6 (ref 5–15)
BUN: 23 mg/dL (ref 8–23)
CO2: 24 mmol/L (ref 22–32)
Calcium: 9 mg/dL (ref 8.9–10.3)
Chloride: 107 mmol/L (ref 98–111)
Creatinine, Ser: 1.24 mg/dL — ABNORMAL HIGH (ref 0.44–1.00)
GFR, Estimated: 42 mL/min — ABNORMAL LOW (ref >60)
Glucose, Bld: 116 mg/dL — ABNORMAL HIGH (ref 70–99)
Potassium: 4.5 mmol/L (ref 3.5–5.1)
Sodium: 137 mmol/L (ref 135–145)

## 2022-09-18 LAB — VITAMIN B12: Vitamin B-12: 543 pg/mL (ref 180–914)

## 2022-09-18 MED ORDER — CYANOCOBALAMIN 1000 MCG/ML IJ SOLN
1000.0000 ug | Freq: Once | INTRAMUSCULAR | Status: AC
  Administered 2022-09-18: 1000 ug via INTRAMUSCULAR
  Filled 2022-09-18: qty 1

## 2022-09-18 MED ORDER — EPOETIN ALFA-EPBX 40000 UNIT/ML IJ SOLN
40000.0000 [IU] | Freq: Once | INTRAMUSCULAR | Status: AC
  Administered 2022-09-18: 40000 [IU] via SUBCUTANEOUS

## 2022-09-18 NOTE — Assessment & Plan Note (Addendum)
#  Severe anemia hemoglobin-nadir 6 [summer 2020]. Likely CKD/iron deficiency [July 2021- Bone marrow biopsy-mild dyserythropoietic changes]. [place] 2023- NEGATIVE for hemolysis [haptoglobin-WNL]  # Today hemoglobin is  8.2; . continue Feraheme monthly..  Proceed with Retacrit today.  Plan Feraheme in 2 weeks.  # B12 def- FEB 2023- 232; start B12 injections every monthly.Luetta Nutting 2023] ; B12 levels pending today.  Proceed with injection today.  # CKD stage III-1.5- GFR- 40s STABLE;  Recommend continued p.o. intake.; STABLE  # TIA: on plavix [Dr.Shah] on PT.  # DISPOSITION:  # HOLD  Ferrahem today; # Proceed with RETACRIT;  B 12 injection # in 2 weeks-ferrahem # in 4 weeks-  MD-  labs-  labs-cbc;bmp; possible Ferrahem or possible retacrit; B12 injection- Dr.B

## 2022-09-18 NOTE — Progress Notes (Signed)
Patient has no concerns at the moment. 

## 2022-09-18 NOTE — Progress Notes (Signed)
I connected with [name] on 09/18/22 at  1:15 PM EDT by video enabled telemedicine visit and verified that I am speaking with the correct person using two identifiers.  I discussed the limitations, risks, security and privacy concerns of performing an evaluation and management service by telemedicine and the availability of in-person appointments. I also discussed with the patient that there may be a patient responsible charge related to this service. The patient expressed understanding and agreed to proceed.    Other persons participating in the visit and their role in the encounter: RN/medical reconciliation Patient's location: home Provider's location: office  Oncology History   No history exists.    Chief Complaint: Anemia.     History of present illness:Jocelyn Elliott 88 y.o.  female with history of recurrent iron deficiency anemia unclear etiology/?-related to CKD-on Feraheme/Retacrit is here for follow-up.   Patient denies any blood in stools or black-colored stools. Patient denies any nausea vomiting. Patient admits to improvement of her fatigue   Observation/objective: Alert & oriented x 3. In No acute distress.   Assessment and plan: Anemia of chronic kidney failure, stage 3 (moderate) (HCC) #Severe anemia hemoglobin-nadir 6 [summer 2020]. 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 JUNE 2023- NEGATIVE for hemolysis [haptoglobin-WNL]  # Today hemoglobin is  8.2; . continue Feraheme monthly..  Proceed with Retacrit today.  Plan Feraheme in 2 weeks.  # B12 def- FEB 2023- 232; start B12 injections every monthly..[march 2023] ; B12 levels pending today.  Proceed with injection today.  # CKD stage III-1.5- GFR- 40s STABLE;  Recommend continued p.o. intake.; STABLE  # TIA: on plavix [Dr.Shah] on PT.  # DISPOSITION:  # HOLD  Ferrahem today; # Proceed with RETACRIT;  B 12  injection # in 2 weeks-ferrahem # in 4 weeks-  MD-  labs-  labs-cbc;bmp; possible Ferrahem or possible retacrit; B12 injection- Dr.B      Follow-up instructions:  I discussed the assessment and treatment plan with the patient.  The patient was provided an opportunity to ask questions and all were answered.  The patient agreed with the plan and demonstrated understanding of instructions.  The patient was advised to call back or seek an in person evaluation if the symptoms worsen or if the condition fails to improve as anticipated.    Dr.   [facility] 09/18/2022 2:29 PM 

## 2022-09-21 ENCOUNTER — Other Ambulatory Visit: Payer: Self-pay | Admitting: Internal Medicine

## 2022-10-02 ENCOUNTER — Inpatient Hospital Stay: Payer: Medicare Other | Attending: Internal Medicine

## 2022-10-02 VITALS — BP 124/54 | HR 73 | Temp 96.1°F | Resp 18

## 2022-10-02 DIAGNOSIS — N183 Chronic kidney disease, stage 3 unspecified: Secondary | ICD-10-CM | POA: Insufficient documentation

## 2022-10-02 DIAGNOSIS — D631 Anemia in chronic kidney disease: Secondary | ICD-10-CM | POA: Insufficient documentation

## 2022-10-02 DIAGNOSIS — D508 Other iron deficiency anemias: Secondary | ICD-10-CM

## 2022-10-02 DIAGNOSIS — G459 Transient cerebral ischemic attack, unspecified: Secondary | ICD-10-CM | POA: Insufficient documentation

## 2022-10-02 DIAGNOSIS — Z7902 Long term (current) use of antithrombotics/antiplatelets: Secondary | ICD-10-CM | POA: Diagnosis not present

## 2022-10-02 MED ORDER — SODIUM CHLORIDE 0.9 % IV SOLN
Freq: Once | INTRAVENOUS | Status: AC
  Filled 2022-10-02: qty 250

## 2022-10-02 MED ORDER — SODIUM CHLORIDE 0.9 % IV SOLN
510.0000 mg | Freq: Once | INTRAVENOUS | Status: AC
  Administered 2022-10-02: 510 mg via INTRAVENOUS
  Filled 2022-10-02: qty 17

## 2022-10-05 ENCOUNTER — Other Ambulatory Visit: Payer: Self-pay | Admitting: Internal Medicine

## 2022-10-15 MED FILL — Ferumoxytol Inj 510 MG/17ML (30 MG/ML) (Elemental Fe): INTRAVENOUS | Qty: 17 | Status: AC

## 2022-10-16 ENCOUNTER — Ambulatory Visit: Payer: Medicare Other

## 2022-10-16 ENCOUNTER — Inpatient Hospital Stay (HOSPITAL_BASED_OUTPATIENT_CLINIC_OR_DEPARTMENT_OTHER): Payer: Medicare Other | Admitting: Internal Medicine

## 2022-10-16 ENCOUNTER — Encounter: Payer: Self-pay | Admitting: Internal Medicine

## 2022-10-16 ENCOUNTER — Inpatient Hospital Stay: Payer: Medicare Other

## 2022-10-16 ENCOUNTER — Ambulatory Visit: Payer: Medicare Other | Admitting: Internal Medicine

## 2022-10-16 ENCOUNTER — Other Ambulatory Visit: Payer: Medicare Other

## 2022-10-16 VITALS — BP 117/66 | HR 68 | Temp 98.2°F | Resp 16 | Wt 140.2 lb

## 2022-10-16 DIAGNOSIS — D649 Anemia, unspecified: Secondary | ICD-10-CM

## 2022-10-16 DIAGNOSIS — N183 Chronic kidney disease, stage 3 unspecified: Secondary | ICD-10-CM

## 2022-10-16 DIAGNOSIS — D631 Anemia in chronic kidney disease: Secondary | ICD-10-CM | POA: Diagnosis not present

## 2022-10-16 LAB — CBC WITH DIFFERENTIAL/PLATELET
Abs Immature Granulocytes: 0.02 10*3/uL (ref 0.00–0.07)
Basophils Absolute: 0.1 10*3/uL (ref 0.0–0.1)
Basophils Relative: 1 %
Eosinophils Absolute: 0.2 10*3/uL (ref 0.0–0.5)
Eosinophils Relative: 3 %
HCT: 30.2 % — ABNORMAL LOW (ref 36.0–46.0)
Hemoglobin: 9.5 g/dL — ABNORMAL LOW (ref 12.0–15.0)
Immature Granulocytes: 0 %
Lymphocytes Relative: 20 %
Lymphs Abs: 1.3 10*3/uL (ref 0.7–4.0)
MCH: 34.7 pg — ABNORMAL HIGH (ref 26.0–34.0)
MCHC: 31.5 g/dL (ref 30.0–36.0)
MCV: 110.2 fL — ABNORMAL HIGH (ref 80.0–100.0)
Monocytes Absolute: 0.4 10*3/uL (ref 0.1–1.0)
Monocytes Relative: 7 %
Neutro Abs: 4.4 10*3/uL (ref 1.7–7.7)
Neutrophils Relative %: 69 %
Platelets: 173 10*3/uL (ref 150–400)
RBC: 2.74 MIL/uL — ABNORMAL LOW (ref 3.87–5.11)
RDW: 13.6 % (ref 11.5–15.5)
WBC: 6.3 10*3/uL (ref 4.0–10.5)
nRBC: 0 % (ref 0.0–0.2)

## 2022-10-16 LAB — IRON AND TIBC
Iron: 54 ug/dL (ref 28–170)
Saturation Ratios: 17 % (ref 10.4–31.8)
TIBC: 326 ug/dL (ref 250–450)
UIBC: 272 ug/dL

## 2022-10-16 LAB — BASIC METABOLIC PANEL
Anion gap: 7 (ref 5–15)
BUN: 20 mg/dL (ref 8–23)
CO2: 25 mmol/L (ref 22–32)
Calcium: 8.9 mg/dL (ref 8.9–10.3)
Chloride: 105 mmol/L (ref 98–111)
Creatinine, Ser: 1.17 mg/dL — ABNORMAL HIGH (ref 0.44–1.00)
GFR, Estimated: 45 mL/min — ABNORMAL LOW (ref >60)
Glucose, Bld: 129 mg/dL — ABNORMAL HIGH (ref 70–99)
Potassium: 4 mmol/L (ref 3.5–5.1)
Sodium: 137 mmol/L (ref 135–145)

## 2022-10-16 LAB — FERRITIN: Ferritin: 81 ng/mL (ref 11–307)

## 2022-10-16 MED ORDER — EPOETIN ALFA-EPBX 40000 UNIT/ML IJ SOLN
40000.0000 [IU] | Freq: Once | INTRAMUSCULAR | Status: AC
  Administered 2022-10-16: 40000 [IU] via SUBCUTANEOUS
  Filled 2022-10-16: qty 1

## 2022-10-16 MED ORDER — CYANOCOBALAMIN 1000 MCG/ML IJ SOLN
1000.0000 ug | Freq: Once | INTRAMUSCULAR | Status: AC
  Administered 2022-10-16: 1000 ug via INTRAMUSCULAR
  Filled 2022-10-16: qty 1

## 2022-10-16 NOTE — Progress Notes (Signed)
Patient denies new problems/concerns today.   °

## 2022-10-16 NOTE — Progress Notes (Signed)
[place] OFFICE PROGRESS NOTE  Patient Care Team: Einar Pheasant, MD as PCP - General (Internal Medicine) Einar Pheasant, MD (Internal Medicine) Isaias Cowman, MD (Internal Medicine) Brendolyn Patty, MD (Specialist) Schnier, Dolores Lory, MD (Vascular Surgery) Isaias Cowman, MD (Internal Medicine) Brendolyn Patty, MD (Specialist) Schnier, Dolores Lory, MD (Vascular Surgery) Cammie Sickle, MD as Consulting Physician (Oncology)   SUMMARY OF HEMATOLOGIC HISTORY:  # IRON DEFICIENCY ANEMIA- recurrent [? GI blood loss; Dr.Elliot; AVM-capsule]; IV Ferrahem q 34m last colo- 2012/march; EGD-- [id number]  July 2020 bone marrow biopsy-mild dysplastic changes; 40% hypercellularity; cytogenetics-normal.-Retacrit.MARCH 2023- Foundation One Hem- TP53 subclonal. B12 def- FEB 2023- Start B12 injections Monthly; MAY 3rd, 2023- Start Retacrit 20K [place].   #Colonoscopy-April 2020 aborted because of cardiac pause.  # CKD stage III-IV; CAD-Dr. PSaralyn Pilar  March 30th, 2022-TIA/slurred spleech [Dr.Shah]  INTERVAL HISTORY: In a wheelchair.  Alone.    88year old female patient with above history of recurrent iron deficiency anemia unclear etiology/?-related to CKD-on Feraheme/Retacrit is here for follow-up.  Patient denies any blood in stools or black-colored stools. Patient denies any nausea vomiting. Patient admits to improvement of her fatigue .   Review of Systems  Constitutional:  Positive for malaise/fatigue. Negative for chills, diaphoresis, fever and weight loss.  HENT:  Negative for nosebleeds and sore throat.   Eyes:  Negative for double vision.  Respiratory:  Negative for cough, hemoptysis, sputum production and wheezing.   Cardiovascular:  Negative for palpitations, orthopnea and leg swelling.  Gastrointestinal:  Negative for abdominal pain, blood in stool, constipation, diarrhea, heartburn, melena, nausea and vomiting.  Genitourinary:  Negative for dysuria,  frequency and urgency.  Musculoskeletal:  Negative for back pain and joint pain.  Skin: Negative.  Negative for itching and rash.  Neurological:  Negative for dizziness, tingling, focal weakness, weakness and headaches.  Endo/Heme/Allergies:  Does not bruise/bleed easily.  Psychiatric/Behavioral:  Negative for depression. The patient is not nervous/anxious and does not have insomnia.      PAST MEDICAL HISTORY :  Past Medical History:  Diagnosis Date   Arthritis    AV malformation of gastrointestinal tract    Blood in stool    CAD (coronary artery disease)    Carotid arterial disease (HCC)    Cataracts, bilateral    Chronic blood loss anemia    Chronic cystitis    CKD (chronic kidney disease), stage III (HCC)    Complication of anesthesia    reaction to propofol - caused heart to pause   Depression    Diabetes mellitus (HCC)    GERD (gastroesophageal reflux disease)    Hyperlipidemia    Hypertension    IDA (iron deficiency anemia)    Stress incontinence    TIA (transient ischemic attack) 03/2021   No deficits   Wears dentures    full upper and lower    PAST SURGICAL HISTORY :   Past Surgical History:  Procedure Laterality Date   ABDOMINAL HYSTERECTOMY  11/19/1970   ovaries left in place   APPENDECTOMY  11/19/1990   BACK SURGERY  06/08/2010   L3, L4, L5   CARDIAC CATHETERIZATION  02/2001   CAROTID ARTERY ANGIOPLASTY  09/19/2002   CARPAL TUNNEL RELEASE  1991, 92   right then left    CATARACT EXTRACTION W/PHACO Left 02/13/2022   Procedure: CATARACT EXTRACTION PHACO AND INTRAOCULAR LENS PLACEMENT ([place] LEFT DIABETIC 14.90 01:26.8;  Surgeon: PBirder Robson MD;  Location[place] [facility]  Service: Ophthalmology;  Laterality: Left;  Diabetic  CATARACT EXTRACTION W/PHACO Right 03/06/2022   Procedure: CATARACT EXTRACTION PHACO AND INTRAOCULAR LENS PLACEMENT (IOC) RIGHT DIABETIC 9.66 00:53.7;  Surgeon: Birder Robson, MD;  Location: [place];  Service:  Ophthalmology;  Laterality: Right;  Diabetic   CHOLECYSTECTOMY  11/20/1991   COLONOSCOPY WITH PROPOFOL N/A 02/02/2019   Procedure: COLONOSCOPY WITH PROPOFOL;  Surgeon: Manya Silvas, MD;  Location: [facility] ENDOSCOPY;  Service: Endoscopy;  Laterality: N/A;   ESOPHAGOGASTRODUODENOSCOPY N/A 02/02/2019   Procedure: ESOPHAGOGASTRODUODENOSCOPY (EGD);  Surgeon: Manya Silvas, MD;  Location: [facility] ENDOSCOPY;  Service: Endoscopy;  Laterality: N/A;   FOOT SURGERY  06/20/2007   Left   right carotid artery surgery  01/09/2013   Dr. Delana Meyer @ [facility]   TOTAL HIP ARTHROPLASTY  05/03/2011   left 12, right 11/07    FAMILY HISTORY :   Family History  Problem Relation Age of Onset   Hyperlipidemia Father    Heart disease Father        myocardial infarction   Hypertension Father        Parent   Arthritis Other        parent   Diabetes Other        nephew   Cervical cancer Sister    Rectal cancer Sister    Breast cancer Neg Hx     SOCIAL HISTORY:   Social History   Tobacco Use   Smoking status: Former    Types: Cigarettes    Quit date: 11/19/1989    Years since quitting: 32.9   Smokeless tobacco: Never  Vaping Use   Vaping Use: Never used  Substance Use Topics   Alcohol use: No    Alcohol/week: 0.0 standard drinks of alcohol   Drug use: No    ALLERGIES:  is allergic to propofol, ciprofloxacin, levaquin [levofloxacin], and tequin [gatifloxacin].  MEDICATIONS:  Current Outpatient Medications  Medication Sig Dispense Refill   aspirin 81 MG tablet Take 81 mg by mouth daily.     Biotin 5000 MCG CAPS Take 1 capsule by mouth daily.     blood glucose meter kit and supplies KIT Dispense based on insurance preference. Use daily to check sugars once daily. Dx e11.9 1 each 0   cetirizine (ZYRTEC) 10 MG tablet Take 10 mg by mouth daily.     docusate sodium (COLACE) 100 MG capsule Take 100 mg by mouth daily as needed for mild constipation.     ferrous sulfate 325 (65 FE) MG EC tablet Take 325  mg by mouth every other day. Every other day     finasteride (PROSCAR) 5 MG tablet Take 1 tablet (5 mg total) by mouth daily. 30 tablet 1   guaiFENesin (MUCINEX) 600 MG 12 hr tablet Take by mouth 2 (two) times daily as needed.     Lancets (ONETOUCH DELICA PLUS [id number]) MISC USE AS INSTRUCTED TO CHECK BLOOD SUGARS ONCE DAILY. DX E 11.9 100 each 12   Omega-3 Fatty Acids (FISH OIL) 1200 MG CAPS Take 1 capsule by mouth daily.     ONETOUCH ULTRA test strip USE AS INSTRUCTED TO CHECK BLOOD SUGARS ONCE DAILY. DX E11.9 100 strip 12   Probiotic, Lactobacillus, CAPS      rosuvastatin (CRESTOR) 40 MG tablet TAKE 1 TABLET BY MOUTH EVERY DAY 90 tablet 1   traZODone (DESYREL) 50 MG tablet TAKE 1/2 TO 1 TABLET BY MOUTH AT BEDTIME AS NEEDED FOR SLEEP 90 tablet 1   metFORMIN (GLUCOPHAGE) 500 MG tablet TAKE 1 TABLET BY MOUTH EVERY DAY  WITH BREAKFAST (Patient not taking: Reported on 10/16/2022) 90 tablet 1   No current facility-administered medications for this visit.   Facility-Administered Medications Ordered in Other Visits  Medication Dose Route Frequency Provider Last Rate Last Admin   cyanocobalamin (VITAMIN B12) injection 1,000 mcg  1,000 mcg Intramuscular Once Cammie Sickle, MD       epoetin alfa-epbx (RETACRIT) injection 40,000 Units  40,000 Units Subcutaneous Once Cammie Sickle, MD        PHYSICAL EXAMINATION:   BP 117/66 (BP Location: Left Arm, Patient Position: Sitting)   Pulse 68   Temp 98.2 F (36.8 C) (Tympanic)   Resp 16   Wt 140 lb 3.2 oz (63.6 kg)   BMI 28.32 kg/m   Filed Weights   10/16/22 1300  Weight: 140 lb 3.2 oz (63.6 kg)     Physical Exam Constitutional:      Comments: [place] Caucasian female patient.  She is in a wheelchair.  HENT:     Head: Normocephalic and atraumatic.     Mouth/Throat:     Pharynx: No oropharyngeal exudate.  Eyes:     Pupils: Pupils are equal, round, and reactive to light.     Comments: Positive for pallor.   Cardiovascular:     Rate and Rhythm: Normal rate and regular rhythm.  Pulmonary:     Effort: No respiratory distress.     Breath sounds: No wheezing.  Abdominal:     General: Bowel sounds are normal. There is no distension.     Palpations: Abdomen is soft. There is no mass.     Tenderness: There is no abdominal tenderness. There is no guarding or rebound.  Musculoskeletal:        General: No tenderness. Normal range of motion.     Cervical back: Normal range of motion and neck supple.  Skin:    General: Skin is warm.  Neurological:     Mental Status: She is alert and oriented to person, place, and time.  Psychiatric:        Mood and Affect: Affect normal.     LABORATORY DATA:  I have reviewed the data as listed    Component Value Date/Time   NA 137 10/16/2022 1312   NA 143 01/10/2013 0314   K 4.0 10/16/2022 1312   K 4.0 01/10/2013 0314   CL 105 10/16/2022 1312   CL 109 (H) 01/10/2013 0314   CO2 25 10/16/2022 1312   CO2 27 01/10/2013 0314   GLUCOSE 129 (H) 10/16/2022 1312   GLUCOSE 119 (H) 01/10/2013 0314   BUN 20 10/16/2022 1312   BUN 15 01/10/2013 0314   CREATININE 1.17 (H) 10/16/2022 1312   CREATININE 1.03 01/10/2013 0314   CREATININE 1.13 (H) 09/18/2012 0848   CALCIUM 8.9 10/16/2022 1312   CALCIUM 8.1 (L) 01/10/2013 0314   PROT 6.3 07/16/2022 1106   PROT 7.2 12/20/2011 1521   ALBUMIN 3.9 07/16/2022 1106   ALBUMIN 3.9 12/20/2011 1521   AST 26 07/16/2022 1106   AST 31 12/20/2011 1521   ALT 26 07/16/2022 1106   ALT 37 12/20/2011 1521   ALKPHOS 45 07/16/2022 1106   ALKPHOS 57 12/20/2011 1521   BILITOT 0.3 07/16/2022 1106   BILITOT 0.5 12/20/2011 1521   GFRNONAA 45 (L) 10/16/2022 1312   GFRNONAA 52 (L) 01/10/2013 0314   GFRNONAA 47 (L) 09/18/2012 0848   GFRAA 46 (L) 07/12/2020 1240   GFRAA >60 01/10/2013 0314   GFRAA 54 (L) 09/18/2012 0848  No results found for: "SPEP", "UPEP"  Lab Results  Component Value Date   WBC 6.3 10/16/2022   NEUTROABS 4.4  10/16/2022   HGB 9.5 (L) 10/16/2022   HCT 30.2 (L) 10/16/2022   MCV 110.2 (H) 10/16/2022   PLT 173 10/16/2022      Chemistry      Component Value Date/Time   NA 137 10/16/2022 1312   NA 143 01/10/2013 0314   K 4.0 10/16/2022 1312   K 4.0 01/10/2013 0314   CL 105 10/16/2022 1312   CL 109 (H) 01/10/2013 0314   CO2 25 10/16/2022 1312   CO2 27 01/10/2013 0314   BUN 20 10/16/2022 1312   BUN 15 01/10/2013 0314   CREATININE 1.17 (H) 10/16/2022 1312   CREATININE 1.03 01/10/2013 0314   CREATININE 1.13 (H) 09/18/2012 0848      Component Value Date/Time   CALCIUM 8.9 10/16/2022 1312   CALCIUM 8.1 (L) 01/10/2013 0314   ALKPHOS 45 07/16/2022 1106   ALKPHOS 57 12/20/2011 1521   AST 26 07/16/2022 1106   AST 31 12/20/2011 1521   ALT 26 07/16/2022 1106   ALT 37 12/20/2011 1521   BILITOT 0.3 07/16/2022 1106   BILITOT 0.5 12/20/2011 1521         ASSESSMENT & PLAN:   Anemia of chronic kidney failure, stage 3 (moderate) (HCC) #Severe anemia hemoglobin-nadir 6 [summer 2020]. Likely CKD/iron deficiency [July 2021- Bone marrow biopsy-mild dyserythropoietic changes]. [place] 2023- NEGATIVE for hemolysis [haptoglobin-WNL]  # Today hemoglobin is  9.5 . continue Feraheme monthly.  Proceed with Retacrit today.  Plan Feraheme in 2 weeks. STABLE.   # B12 def- FEB 2023- 232; start B12 injections every monthly. [march 2023] ; B12 levels pending today.  Proceed with injection today.STABLE.   # CKD stage III-GFR- 40s STABLE;  Recommend continued p.o. intake.; STABLE  # TIA: on plavix [Dr.Shah] on PT.STABLE.   # DISPOSITION:  # HOLD  Ferrahem today # Proceed with RETACRIT;  B 12 injection # in 2 weeks-ferrahem # in 4 weeks-  MD-  labs-  labs-cbc;bmp; LDH; possible Ferrahem or possible retacrit; B12 injection- Dr.B       Cammie Sickle, MD 10/16/2022 1:59 PM

## 2022-10-16 NOTE — Assessment & Plan Note (Addendum)
#  Severe anemia hemoglobin-nadir 6 [summer 2020]. Likely CKD/iron deficiency [July 2021- Bone marrow biopsy-mild dyserythropoietic changes]. [place] 2023- NEGATIVE for hemolysis [haptoglobin-WNL]  # Today hemoglobin is  9.5 . continue Feraheme monthly.  Proceed with Retacrit today.  Plan Feraheme in 2 weeks. STABLE.   # B12 def- FEB 2023- 232; start B12 injections every monthly. [march 2023] ; B12 levels pending today.  Proceed with injection today.STABLE.   # CKD stage III-GFR- 40s STABLE;  Recommend continued p.o. intake.; STABLE  # TIA: on plavix [Dr.Shah] on PT.STABLE.   # DISPOSITION:  # HOLD  Ferrahem today # Proceed with RETACRIT;  B 12 injection # in 2 weeks-ferrahem # in 4 weeks-  MD-  labs-  labs-cbc;bmp; LDH; possible Ferrahem or possible retacrit; B12 injection- Dr.B

## 2022-10-29 ENCOUNTER — Other Ambulatory Visit: Payer: Self-pay | Admitting: Dermatology

## 2022-10-29 DIAGNOSIS — L649 Androgenic alopecia, unspecified: Secondary | ICD-10-CM

## 2022-10-30 ENCOUNTER — Inpatient Hospital Stay: Payer: Medicare Other | Attending: Internal Medicine

## 2022-10-30 VITALS — BP 130/58 | HR 78 | Temp 97.7°F | Resp 16

## 2022-10-30 DIAGNOSIS — G459 Transient cerebral ischemic attack, unspecified: Secondary | ICD-10-CM | POA: Diagnosis not present

## 2022-10-30 DIAGNOSIS — D631 Anemia in chronic kidney disease: Secondary | ICD-10-CM | POA: Insufficient documentation

## 2022-10-30 DIAGNOSIS — N183 Chronic kidney disease, stage 3 unspecified: Secondary | ICD-10-CM | POA: Diagnosis present

## 2022-10-30 DIAGNOSIS — Z7902 Long term (current) use of antithrombotics/antiplatelets: Secondary | ICD-10-CM | POA: Diagnosis not present

## 2022-10-30 DIAGNOSIS — D508 Other iron deficiency anemias: Secondary | ICD-10-CM

## 2022-10-30 MED ORDER — SODIUM CHLORIDE 0.9 % IV SOLN
510.0000 mg | Freq: Once | INTRAVENOUS | Status: AC
  Administered 2022-10-30: 510 mg via INTRAVENOUS
  Filled 2022-10-30: qty 510

## 2022-10-30 MED ORDER — SODIUM CHLORIDE 0.9 % IV SOLN
Freq: Once | INTRAVENOUS | Status: AC
  Filled 2022-10-30: qty 250

## 2022-10-30 NOTE — Patient Instructions (Signed)

## 2022-11-06 ENCOUNTER — Ambulatory Visit: Payer: Medicare Other | Admitting: Dermatology

## 2022-11-13 ENCOUNTER — Encounter: Payer: Self-pay | Admitting: Internal Medicine

## 2022-11-13 ENCOUNTER — Inpatient Hospital Stay: Payer: Medicare Other

## 2022-11-13 ENCOUNTER — Inpatient Hospital Stay (HOSPITAL_BASED_OUTPATIENT_CLINIC_OR_DEPARTMENT_OTHER): Payer: Medicare Other | Admitting: Internal Medicine

## 2022-11-13 VITALS — BP 103/65 | HR 74 | Temp 97.8°F | Resp 16 | Wt 141.2 lb

## 2022-11-13 DIAGNOSIS — D649 Anemia, unspecified: Secondary | ICD-10-CM

## 2022-11-13 DIAGNOSIS — D631 Anemia in chronic kidney disease: Secondary | ICD-10-CM | POA: Diagnosis not present

## 2022-11-13 DIAGNOSIS — N183 Chronic kidney disease, stage 3 unspecified: Secondary | ICD-10-CM | POA: Diagnosis not present

## 2022-11-13 LAB — CBC WITH DIFFERENTIAL/PLATELET
Abs Immature Granulocytes: 0.03 10*3/uL (ref 0.00–0.07)
Basophils Absolute: 0 10*3/uL (ref 0.0–0.1)
Basophils Relative: 1 %
Eosinophils Absolute: 0.2 10*3/uL (ref 0.0–0.5)
Eosinophils Relative: 3 %
HCT: 28.9 % — ABNORMAL LOW (ref 36.0–46.0)
Hemoglobin: 9.2 g/dL — ABNORMAL LOW (ref 12.0–15.0)
Immature Granulocytes: 0 %
Lymphocytes Relative: 18 %
Lymphs Abs: 1.3 10*3/uL (ref 0.7–4.0)
MCH: 33.8 pg (ref 26.0–34.0)
MCHC: 31.8 g/dL (ref 30.0–36.0)
MCV: 106.3 fL — ABNORMAL HIGH (ref 80.0–100.0)
Monocytes Absolute: 0.4 10*3/uL (ref 0.1–1.0)
Monocytes Relative: 6 %
Neutro Abs: 5 10*3/uL (ref 1.7–7.7)
Neutrophils Relative %: 72 %
Platelets: 158 10*3/uL (ref 150–400)
RBC: 2.72 MIL/uL — ABNORMAL LOW (ref 3.87–5.11)
RDW: 13.5 % (ref 11.5–15.5)
WBC: 7 10*3/uL (ref 4.0–10.5)
nRBC: 0 % (ref 0.0–0.2)

## 2022-11-13 LAB — BASIC METABOLIC PANEL
Anion gap: 5 (ref 5–15)
BUN: 23 mg/dL (ref 8–23)
CO2: 26 mmol/L (ref 22–32)
Calcium: 8.7 mg/dL — ABNORMAL LOW (ref 8.9–10.3)
Chloride: 106 mmol/L (ref 98–111)
Creatinine, Ser: 1.26 mg/dL — ABNORMAL HIGH (ref 0.44–1.00)
GFR, Estimated: 41 mL/min — ABNORMAL LOW (ref >60)
Glucose, Bld: 131 mg/dL — ABNORMAL HIGH (ref 70–99)
Potassium: 3.8 mmol/L (ref 3.5–5.1)
Sodium: 137 mmol/L (ref 135–145)

## 2022-11-13 LAB — LACTATE DEHYDROGENASE: LDH: 179 U/L (ref 98–192)

## 2022-11-13 MED ORDER — CYANOCOBALAMIN 1000 MCG/ML IJ SOLN
1000.0000 ug | Freq: Once | INTRAMUSCULAR | Status: AC
  Administered 2022-11-13: 1000 ug via INTRAMUSCULAR
  Filled 2022-11-13: qty 1

## 2022-11-13 MED ORDER — EPOETIN ALFA-EPBX 40000 UNIT/ML IJ SOLN
40000.0000 [IU] | Freq: Once | INTRAMUSCULAR | Status: AC
  Administered 2022-11-13: 40000 [IU] via SUBCUTANEOUS
  Filled 2022-11-13: qty 1

## 2022-11-13 MED FILL — Ferumoxytol Inj 510 MG/17ML (30 MG/ML) (Elemental Fe): INTRAVENOUS | Qty: 17 | Status: AC

## 2022-11-13 NOTE — Progress Notes (Signed)
[place] OFFICE PROGRESS NOTE  Patient Care Team: Einar Pheasant, MD as PCP - General (Internal Medicine) Einar Pheasant, MD (Internal Medicine) Isaias Cowman, MD (Internal Medicine) Brendolyn Patty, MD (Specialist) Schnier, Dolores Lory, MD (Vascular Surgery) Isaias Cowman, MD (Internal Medicine) Brendolyn Patty, MD (Specialist) Schnier, Dolores Lory, MD (Vascular Surgery) Cammie Sickle, MD as Consulting Physician (Oncology)   SUMMARY OF HEMATOLOGIC HISTORY:  # IRON DEFICIENCY ANEMIA- recurrent [? GI blood loss; Dr.Elliot; AVM-capsule]; IV Ferrahem q 60m last colo- 2012/march; EGD-- [id number]  July 2020 bone marrow biopsy-mild dysplastic changes; 40% hypercellularity; cytogenetics-normal.-Retacrit.MARCH 2023- Foundation One Hem- TP53 subclonal. B12 def- FEB 2023- Start B12 injections Monthly; MAY 3rd, 2023- Start Retacrit 20K [place].   #Colonoscopy-April 2020 aborted because of cardiac pause.  # CKD stage III-IV; CAD-Dr. PSaralyn Pilar  March 30th, 2022-TIA/slurred spleech [Dr.Shah]  INTERVAL HISTORY: In a wheelchair.  Alone.    88year old female patient with above history of recurrent iron deficiency anemia unclear etiology/?-related to CKD-on Feraheme/Retacrit is here for follow-up.  Patient denies new problems/concerns today. Patient denies any blood in stools or black-colored stools. Patient denies any nausea vomiting. Patient admits to improvement of her fatigue .   Review of Systems  Constitutional:  Positive for malaise/fatigue. Negative for chills, diaphoresis, fever and weight loss.  HENT:  Negative for nosebleeds and sore throat.   Eyes:  Negative for double vision.  Respiratory:  Negative for cough, hemoptysis, sputum production and wheezing.   Cardiovascular:  Negative for palpitations, orthopnea and leg swelling.  Gastrointestinal:  Negative for abdominal pain, blood in stool, constipation, diarrhea, heartburn, melena, nausea and vomiting.   Genitourinary:  Negative for dysuria, frequency and urgency.  Musculoskeletal:  Negative for back pain and joint pain.  Skin: Negative.  Negative for itching and rash.  Neurological:  Negative for dizziness, tingling, focal weakness, weakness and headaches.  Endo/Heme/Allergies:  Does not bruise/bleed easily.  Psychiatric/Behavioral:  Negative for depression. The patient is not nervous/anxious and does not have insomnia.      PAST MEDICAL HISTORY :  Past Medical History:  Diagnosis Date   Arthritis    AV malformation of gastrointestinal tract    Blood in stool    CAD (coronary artery disease)    Carotid arterial disease (HCC)    Cataracts, bilateral    Chronic blood loss anemia    Chronic cystitis    CKD (chronic kidney disease), stage III (HCC)    Complication of anesthesia    reaction to propofol - caused heart to pause   Depression    Diabetes mellitus (HCC)    GERD (gastroesophageal reflux disease)    Hyperlipidemia    Hypertension    IDA (iron deficiency anemia)    Stress incontinence    TIA (transient ischemic attack) 03/2021   No deficits   Wears dentures    full upper and lower    PAST SURGICAL HISTORY :   Past Surgical History:  Procedure Laterality Date   ABDOMINAL HYSTERECTOMY  11/19/1970   ovaries left in place   APPENDECTOMY  11/19/1990   BACK SURGERY  06/08/2010   L3, L4, L5   CARDIAC CATHETERIZATION  02/2001   CAROTID ARTERY ANGIOPLASTY  09/19/2002   CARPAL TUNNEL RELEASE  1991, 92   right then left    CATARACT EXTRACTION W/PHACO Left 02/13/2022   Procedure: CATARACT EXTRACTION PHACO AND INTRAOCULAR LENS PLACEMENT ([place] LEFT DIABETIC 14.90 01:26.8;  Surgeon: PBirder Robson MD;  Location[place]  Service: Ophthalmology;  Laterality:  Left;  Diabetic   CATARACT EXTRACTION W/PHACO Right 03/06/2022   Procedure: CATARACT EXTRACTION PHACO AND INTRAOCULAR LENS PLACEMENT (IOC) RIGHT DIABETIC 9.66 00:53.7;  Surgeon: Birder Robson, MD;   Location: [place];  Service: Ophthalmology;  Laterality: Right;  Diabetic   CHOLECYSTECTOMY  11/20/1991   COLONOSCOPY WITH PROPOFOL N/A 02/02/2019   Procedure: COLONOSCOPY WITH PROPOFOL;  Surgeon: Manya Silvas, MD;  Location: [facility] ENDOSCOPY;  Service: Endoscopy;  Laterality: N/A;   ESOPHAGOGASTRODUODENOSCOPY N/A 02/02/2019   Procedure: ESOPHAGOGASTRODUODENOSCOPY (EGD);  Surgeon: Manya Silvas, MD;  Location: [facility] ENDOSCOPY;  Service: Endoscopy;  Laterality: N/A;   FOOT SURGERY  06/20/2007   Left   right carotid artery surgery  01/09/2013   Dr. Delana Meyer @ [facility]   TOTAL HIP ARTHROPLASTY  05/03/2011   left 12, right 11/07    FAMILY HISTORY :   Family History  Problem Relation Age of Onset   Hyperlipidemia Father    Heart disease Father        myocardial infarction   Hypertension Father        Parent   Arthritis Other        parent   Diabetes Other        nephew   Cervical cancer Sister    Rectal cancer Sister    Breast cancer Neg Hx     SOCIAL HISTORY:   Social History   Tobacco Use   Smoking status: Former    Types: Cigarettes    Quit date: 11/19/1989    Years since quitting: 33.0   Smokeless tobacco: Never  Vaping Use   Vaping Use: Never used  Substance Use Topics   Alcohol use: No    Alcohol/week: 0.0 standard drinks of alcohol   Drug use: No    ALLERGIES:  is allergic to propofol, ciprofloxacin, levaquin [levofloxacin], and tequin [gatifloxacin].  MEDICATIONS:  Current Outpatient Medications  Medication Sig Dispense Refill   aspirin 81 MG tablet Take 81 mg by mouth daily.     Biotin 5000 MCG CAPS Take 1 capsule by mouth daily.     blood glucose meter kit and supplies KIT Dispense based on insurance preference. Use daily to check sugars once daily. Dx e11.9 1 each 0   cetirizine (ZYRTEC) 10 MG tablet Take 10 mg by mouth daily.     docusate sodium (COLACE) 100 MG capsule Take 100 mg by mouth daily as needed for mild constipation.     ferrous  sulfate 325 (65 FE) MG EC tablet Take 325 mg by mouth every other day. Every other day     finasteride (PROSCAR) 5 MG tablet TAKE 1 TABLET (5 MG TOTAL) BY MOUTH DAILY. 30 tablet 1   guaiFENesin (MUCINEX) 600 MG 12 hr tablet Take by mouth 2 (two) times daily as needed.     Lancets (ONETOUCH DELICA PLUS [id number]) MISC USE AS INSTRUCTED TO CHECK BLOOD SUGARS ONCE DAILY. DX E 11.9 100 each 12   Omega-3 Fatty Acids (FISH OIL) 1200 MG CAPS Take 1 capsule by mouth daily.     ONETOUCH ULTRA test strip USE AS INSTRUCTED TO CHECK BLOOD SUGARS ONCE DAILY. DX E11.9 100 strip 12   Probiotic, Lactobacillus, CAPS      rosuvastatin (CRESTOR) 40 MG tablet TAKE 1 TABLET BY MOUTH EVERY DAY 90 tablet 1   traZODone (DESYREL) 50 MG tablet TAKE 1/2 TO 1 TABLET BY MOUTH AT BEDTIME AS NEEDED FOR SLEEP 90 tablet 1   metFORMIN (GLUCOPHAGE) 500 MG tablet TAKE 1  TABLET BY MOUTH EVERY DAY WITH BREAKFAST (Patient not taking: Reported on 10/16/2022) 90 tablet 1   No current facility-administered medications for this visit.    PHYSICAL EXAMINATION:   BP 103/65 (BP Location: Left Arm, Patient Position: Sitting)   Pulse 74   Temp 97.8 F (36.6 C) (Tympanic)   Resp 16   Wt 141 lb 3.2 oz (64 kg)   BMI 28.52 kg/m   Filed Weights   11/13/22 1400  Weight: 141 lb 3.2 oz (64 kg)     Physical Exam Constitutional:      Comments: Frail-appearing Caucasian female patient.  She is in a wheelchair.  HENT:     Head: Normocephalic and atraumatic.     Mouth/Throat:     Pharynx: No oropharyngeal exudate.  Eyes:     Pupils: Pupils are equal, round, and reactive to light.     Comments: Positive for pallor.  Cardiovascular:     Rate and Rhythm: Normal rate and regular rhythm.  Pulmonary:     Effort: No respiratory distress.     Breath sounds: No wheezing.  Abdominal:     General: Bowel sounds are normal. There is no distension.     Palpations: Abdomen is soft. There is no mass.     Tenderness: There is no abdominal  tenderness. There is no guarding or rebound.  Musculoskeletal:        General: No tenderness. Normal range of motion.     Cervical back: Normal range of motion and neck supple.  Skin:    General: Skin is warm.  Neurological:     Mental Status: She is alert and oriented to person, place, and time.  Psychiatric:        Mood and Affect: Affect normal.     LABORATORY DATA:  I have reviewed the data as listed    Component Value Date/Time   NA 137 11/13/2022 1435   NA 143 01/10/2013 0314   K 3.8 11/13/2022 1435   K 4.0 01/10/2013 0314   CL 106 11/13/2022 1435   CL 109 (H) 01/10/2013 0314   CO2 26 11/13/2022 1435   CO2 27 01/10/2013 0314   GLUCOSE 131 (H) 11/13/2022 1435   GLUCOSE 119 (H) 01/10/2013 0314   BUN 23 11/13/2022 1435   BUN 15 01/10/2013 0314   CREATININE 1.26 (H) 11/13/2022 1435   CREATININE 1.03 01/10/2013 0314   CREATININE 1.13 (H) 09/18/2012 0848   CALCIUM 8.7 (L) 11/13/2022 1435   CALCIUM 8.1 (L) 01/10/2013 0314   PROT 6.3 07/16/2022 1106   PROT 7.2 12/20/2011 1521   ALBUMIN 3.9 07/16/2022 1106   ALBUMIN 3.9 12/20/2011 1521   AST 26 07/16/2022 1106   AST 31 12/20/2011 1521   ALT 26 07/16/2022 1106   ALT 37 12/20/2011 1521   ALKPHOS 45 07/16/2022 1106   ALKPHOS 57 12/20/2011 1521   BILITOT 0.3 07/16/2022 1106   BILITOT 0.5 12/20/2011 1521   GFRNONAA 41 (L) 11/13/2022 1435   GFRNONAA 52 (L) 01/10/2013 0314   GFRNONAA 47 (L) 09/18/2012 0848   GFRAA 46 (L) 07/12/2020 1240   GFRAA >60 01/10/2013 0314   GFRAA 54 (L) 09/18/2012 0848    No results found for: "SPEP", "UPEP"  Lab Results  Component Value Date   WBC 7.0 11/13/2022   NEUTROABS 5.0 11/13/2022   HGB 9.2 (L) 11/13/2022   HCT 28.9 (L) 11/13/2022   MCV 106.3 (H) 11/13/2022   PLT 158 11/13/2022      Chemistry  Component Value Date/Time   NA 137 11/13/2022 1435   NA 143 01/10/2013 0314   K 3.8 11/13/2022 1435   K 4.0 01/10/2013 0314   CL 106 11/13/2022 1435   CL 109 (H) 01/10/2013  0314   CO2 26 11/13/2022 1435   CO2 27 01/10/2013 0314   BUN 23 11/13/2022 1435   BUN 15 01/10/2013 0314   CREATININE 1.26 (H) 11/13/2022 1435   CREATININE 1.03 01/10/2013 0314   CREATININE 1.13 (H) 09/18/2012 0848      Component Value Date/Time   CALCIUM 8.7 (L) 11/13/2022 1435   CALCIUM 8.1 (L) 01/10/2013 0314   ALKPHOS 45 07/16/2022 1106   ALKPHOS 57 12/20/2011 1521   AST 26 07/16/2022 1106   AST 31 12/20/2011 1521   ALT 26 07/16/2022 1106   ALT 37 12/20/2011 1521   BILITOT 0.3 07/16/2022 1106   BILITOT 0.5 12/20/2011 1521         ASSESSMENT & PLAN:   Anemia of chronic kidney failure, stage 3 (moderate) (HCC) #Severe anemia hemoglobin-nadir 6 [summer 2020]. Likely CKD/iron deficiency [July 2021- Bone marrow biopsy-mild dyserythropoietic changes]. [place] 2023- NEGATIVE for hemolysis [haptoglobin-WNL]  # Today hemoglobin is  9.2 . continue Feraheme monthly.  Proceed with Retacrit today.  Plan Feraheme in 2 weeks. STABLE.   # B12 def- FEB 2023- 232; start B12 injections every monthly. [march 2023] ; B12 levels pending today.  Proceed with injection today.STABLE.   # CKD stage III-GFR- 40s STABLE;  Recommend continued p.o. intake.  STABLE  # TIA: on plavix [Dr.Shah] on PT.STABLE.   # DISPOSITION:  # HOLD  Ferrahem today # Proceed with RETACRIT;  B 12 injection # in 4 weeks-  MD-  labs-  labs-cbc;bmp; LDH; possible Ferrahem or possible retacrit; B12 injection- Dr.B       Cammie Sickle, MD 11/13/2022 3:39 PM

## 2022-11-13 NOTE — Progress Notes (Signed)
Patient denies new problems/concerns today.   °

## 2022-11-13 NOTE — Assessment & Plan Note (Addendum)
#  Severe anemia hemoglobin-nadir 6 [summer 2020]. Likely CKD/iron deficiency [July 2021- Bone marrow biopsy-mild dyserythropoietic changes]. [place] 2023- NEGATIVE for hemolysis [haptoglobin-WNL]  # Today hemoglobin is  9.2 . continue Feraheme monthly.  Proceed with Retacrit today.  Plan Feraheme in 2 weeks. STABLE.   # B12 def- FEB 2023- 232; start B12 injections every monthly. [march 2023] ; B12 levels pending today.  Proceed with injection today.STABLE.   # CKD stage III-GFR- 40s STABLE;  Recommend continued p.o. intake.  STABLE  # TIA: on plavix [Dr.Shah] on PT.STABLE.   # DISPOSITION:  # HOLD  Ferrahem today # Proceed with RETACRIT;  B 12 injection # in 4 weeks-  MD-  labs-  labs-cbc;bmp; LDH; possible Ferrahem or possible retacrit; B12 injection- Dr.B

## 2022-11-15 ENCOUNTER — Encounter: Payer: Medicare Other | Admitting: Internal Medicine

## 2022-11-16 ENCOUNTER — Telehealth: Payer: Self-pay | Admitting: Internal Medicine

## 2022-11-16 ENCOUNTER — Telehealth (INDEPENDENT_AMBULATORY_CARE_PROVIDER_SITE_OTHER): Payer: Medicare Other | Admitting: Nurse Practitioner

## 2022-11-16 DIAGNOSIS — J329 Chronic sinusitis, unspecified: Secondary | ICD-10-CM | POA: Insufficient documentation

## 2022-11-16 MED ORDER — AZITHROMYCIN 250 MG PO TABS: ORAL_TABLET | ORAL | 0 refills | Status: AC

## 2022-11-16 MED ORDER — BENZONATATE 100 MG PO CAPS: 100.0000 mg | ORAL_CAPSULE | Freq: Two times a day (BID) | ORAL | 0 refills | Status: DC | PRN

## 2022-11-16 NOTE — Telephone Encounter (Signed)
Patient's daughter called back and her mom tested negative for covid.

## 2022-11-16 NOTE — Assessment & Plan Note (Signed)
Acute, patient's daughter called to notify me that at home COVID test was negative.  Recommend she treat her symptoms with rest, hydration, Tessalon Perles for cough suppression.  Will send prescription for Z-Pak that patient can take if symptoms persist past 7 days or if they worsen before then.  Patient reports understanding.

## 2022-11-16 NOTE — Progress Notes (Signed)
   Established Patient Office Visit  An audio/visual tele-health visit was completed today for this patient. I connected with  Jocelyn Elliott on 11/16/22 utilizing audio/visual technology and verified that I am speaking with the correct person using two identifiers. The patient was located at their home, and I was located at the office of [place] at [facility] during the encounter. I discussed the limitations of evaluation and management by telemedicine. The patient expressed understanding and agreed to proceed.     Subjective   Patient ID: Jocelyn Elliott, female    DOB: [birth year]  Age: 88 y.o. MRN: [id number]  Chief Complaint  Patient presents with   Sinus Problem    Symptom onset 3 days ago. Experiencing congestion, productive cough, headache, post-nasal drip. Has not tested at home to rule out covid infection. Reports that she had covid in the past and current symptoms seem different. Has had similar symptoms in the past that ended up progressing into pneumonia and is hoping to prevent this from happening again.     Review of Systems  Constitutional:  Negative for chills, fever and malaise/fatigue.  HENT:  Positive for congestion.        (+) post-nasal drip  Respiratory:  Positive for cough and sputum production (large amount per patient, yellow-green). Negative for shortness of breath.   Cardiovascular:  Negative for chest pain and leg swelling.  Neurological:  Positive for headaches.  Psychiatric/Behavioral:  The patient does not have insomnia (as long as she sleeps in her chair).       Objective:     There were no vitals taken for this visit.   Physical Exam Comprehensive physical exam not completed today as office visit was conducted remotely.  Patient appears well over video, no evidence of acute respiratory distress noted.  Patient was alert and oriented, and appeared to have appropriate judgment.   No results found for any visits on  11/16/22.    The ASCVD Risk score (Arnett DK, et al., 2019) failed to calculate for the following reasons:   The 2019 ASCVD risk score is only valid for ages 33 to 13    Assessment & Plan:   Problem List Items Addressed This Visit       Respiratory   Sinusitis - Primary    Acute, patient's daughter called to notify me that at home COVID test was negative.  Recommend she treat her symptoms with rest, hydration, Tessalon Perles for cough suppression.  Will send prescription for Z-Pak that patient can take if symptoms persist past 7 days or if they worsen before then.  Patient reports understanding.      Relevant Medications   benzonatate (TESSALON) 100 MG capsule   azithromycin (ZITHROMAX) 250 MG tablet    Return if symptoms worsen or fail to improve.    Ailene Ards, NP

## 2022-11-27 ENCOUNTER — Encounter: Payer: Medicare Other | Admitting: Internal Medicine

## 2022-12-11 ENCOUNTER — Inpatient Hospital Stay: Payer: Medicare Other | Attending: Internal Medicine

## 2022-12-11 ENCOUNTER — Inpatient Hospital Stay (HOSPITAL_BASED_OUTPATIENT_CLINIC_OR_DEPARTMENT_OTHER): Payer: Medicare Other | Admitting: Internal Medicine

## 2022-12-11 ENCOUNTER — Encounter: Payer: Self-pay | Admitting: Internal Medicine

## 2022-12-11 ENCOUNTER — Inpatient Hospital Stay: Payer: Medicare Other

## 2022-12-11 VITALS — BP 124/56 | HR 79 | Temp 98.4°F | Resp 18 | Wt 139.1 lb

## 2022-12-11 DIAGNOSIS — E538 Deficiency of other specified B group vitamins: Secondary | ICD-10-CM | POA: Diagnosis not present

## 2022-12-11 DIAGNOSIS — Z8673 Personal history of transient ischemic attack (TIA), and cerebral infarction without residual deficits: Secondary | ICD-10-CM | POA: Insufficient documentation

## 2022-12-11 DIAGNOSIS — N183 Chronic kidney disease, stage 3 unspecified: Secondary | ICD-10-CM | POA: Diagnosis present

## 2022-12-11 DIAGNOSIS — D631 Anemia in chronic kidney disease: Secondary | ICD-10-CM

## 2022-12-11 DIAGNOSIS — D649 Anemia, unspecified: Secondary | ICD-10-CM

## 2022-12-11 DIAGNOSIS — D508 Other iron deficiency anemias: Secondary | ICD-10-CM

## 2022-12-11 LAB — CBC WITH DIFFERENTIAL/PLATELET
Abs Immature Granulocytes: 0.02 10*3/uL (ref 0.00–0.07)
Basophils Absolute: 0.1 10*3/uL (ref 0.0–0.1)
Basophils Relative: 1 %
Eosinophils Absolute: 0.2 10*3/uL (ref 0.0–0.5)
Eosinophils Relative: 4 %
HCT: 32.4 % — ABNORMAL LOW (ref 36.0–46.0)
Hemoglobin: 10.5 g/dL — ABNORMAL LOW (ref 12.0–15.0)
Immature Granulocytes: 0 %
Lymphocytes Relative: 22 %
Lymphs Abs: 1.4 10*3/uL (ref 0.7–4.0)
MCH: 34.2 pg — ABNORMAL HIGH (ref 26.0–34.0)
MCHC: 32.4 g/dL (ref 30.0–36.0)
MCV: 105.5 fL — ABNORMAL HIGH (ref 80.0–100.0)
Monocytes Absolute: 0.4 10*3/uL (ref 0.1–1.0)
Monocytes Relative: 6 %
Neutro Abs: 4.1 10*3/uL (ref 1.7–7.7)
Neutrophils Relative %: 67 %
Platelets: 171 10*3/uL (ref 150–400)
RBC: 3.07 MIL/uL — ABNORMAL LOW (ref 3.87–5.11)
RDW: 13.2 % (ref 11.5–15.5)
WBC: 6.2 10*3/uL (ref 4.0–10.5)
nRBC: 0 % (ref 0.0–0.2)

## 2022-12-11 LAB — BASIC METABOLIC PANEL
Anion gap: 10 (ref 5–15)
BUN: 21 mg/dL (ref 8–23)
CO2: 24 mmol/L (ref 22–32)
Calcium: 8.5 mg/dL — ABNORMAL LOW (ref 8.9–10.3)
Chloride: 101 mmol/L (ref 98–111)
Creatinine, Ser: 1.23 mg/dL — ABNORMAL HIGH (ref 0.44–1.00)
GFR, Estimated: 42 mL/min — ABNORMAL LOW (ref >60)
Glucose, Bld: 197 mg/dL — ABNORMAL HIGH (ref 70–99)
Potassium: 3.7 mmol/L (ref 3.5–5.1)
Sodium: 135 mmol/L (ref 135–145)

## 2022-12-11 LAB — LACTATE DEHYDROGENASE: LDH: 185 U/L (ref 98–192)

## 2022-12-11 MED ORDER — SODIUM CHLORIDE 0.9 % IV SOLN
510.0000 mg | Freq: Once | INTRAVENOUS | Status: AC
  Administered 2022-12-11: 510 mg via INTRAVENOUS
  Filled 2022-12-11: qty 17

## 2022-12-11 MED ORDER — CYANOCOBALAMIN 1000 MCG/ML IJ SOLN
1000.0000 ug | Freq: Once | INTRAMUSCULAR | Status: AC
  Administered 2022-12-11: 1000 ug via INTRAMUSCULAR
  Filled 2022-12-11: qty 1

## 2022-12-11 MED ORDER — SODIUM CHLORIDE 0.9 % IV SOLN
Freq: Once | INTRAVENOUS | Status: AC
  Filled 2022-12-11: qty 250

## 2022-12-11 NOTE — Assessment & Plan Note (Addendum)
#  Severe anemia hemoglobin-nadir 6 [summer 2020]. Likely CKD/iron deficiency [July 2021- Bone marrow biopsy-mild dyserythropoietic changes]. [place] 2023- NEGATIVE for hemolysis [haptoglobin-WNL]  # Today hemoglobin is  10.2 continue Feraheme monthly.  HOLD  Retacrit today.- stable.    # B12 def- FEB 2023- 232; start B12 injections every monthly. [march 2023] ; B12 levels - 723 [NOV 2023] Proceed with injection today. Stable.    # CKD stage III-GFR- 40s STABLE;  Recommend continued p.o. intake.  Stable.   # TIA: on plavix [Dr.Shah] on PT.stable.   # DISPOSITION:  #  Ferrahem today; B 12 injection # HOLD RETACRIT;   # in 4 weeks-  MD-  labs-  labs-cbc;bmp; LDH; possible Ferrahem or possible retacrit; B12 injection- Dr.B

## 2022-12-11 NOTE — Progress Notes (Signed)
Pt  doing well. Takes oral iron every other day. Denies dizziness or fatigue. No visible bleeding. Stools are dark from iron. Appetite is good smaller more freq meals.

## 2022-12-11 NOTE — Progress Notes (Signed)
[facility] OFFICE PROGRESS NOTE  Patient Care Team: Einar Pheasant, MD as PCP - General (Internal Medicine) Einar Pheasant, MD (Internal Medicine) Isaias [place], MD (Internal Medicine) Brendolyn Patty, MD (Specialist) Schnier, Dolores Lory, MD (Vascular Surgery) Isaias [place], MD (Internal Medicine) Brendolyn Patty, MD (Specialist) Schnier, Dolores Lory, MD (Vascular Surgery) Cammie Sickle, MD as Consulting Physician (Oncology)   SUMMARY OF HEMATOLOGIC HISTORY:  # IRON DEFICIENCY ANEMIA- recurrent [? GI blood loss; Dr.Elliot; AVM-capsule]; IV Ferrahem q 33m; last colo- 2012/march; EGD- [id number].  July 2020 bone marrow biopsy-mild dysplastic changes; 40% hypercellularity; cytogenetics-normal.-Retacrit.MARCH 2023- [facility] Hem- TP53 subclonal. B12 def- FEB 2023- Start B12 injections Monthly; MAY 3rd, 2023- Start Retacrit 20K [place].   #Colonoscopy-April 2020 aborted because of cardiac pause.  # CKD stage III-IV; CAD-Dr. Saralyn Pilar;  March 30th, 2022-TIA/slurred spleech [Dr.Shah]  INTERVAL HISTORY: In a wheelchair.  With her daughter.    89 year old female patient with above history of recurrent iron deficiency anemia unclear etiology/?-related to CKD-on Feraheme/Retacrit is here for follow-up.  Pt doing well. Takes oral iron every other day. Denies dizziness or fatigue. No visible bleeding. Stools are dark from iron. Appetite is good smaller more freq meals. Patient denies any nausea vomiting. Patient admits to improvement of her fatigue .   Review of Systems  Constitutional:  Positive for malaise/fatigue. Negative for chills, diaphoresis, fever and weight loss.  HENT:  Negative for nosebleeds and sore throat.   Eyes:  Negative for double vision.  Respiratory:  Negative for cough, hemoptysis, sputum production and wheezing.   Cardiovascular:  Negative for palpitations, orthopnea and leg swelling.  Gastrointestinal:  Negative for abdominal pain, blood  in stool, constipation, diarrhea, heartburn, melena, nausea and vomiting.  Genitourinary:  Negative for dysuria, frequency and urgency.  Musculoskeletal:  Negative for back pain and joint pain.  Skin: Negative.  Negative for itching and rash.  Neurological:  Negative for dizziness, tingling, focal weakness, weakness and headaches.  Endo/Heme/Allergies:  Does not bruise/bleed easily.  Psychiatric/Behavioral:  Negative for depression. The patient is not nervous/anxious and does not have insomnia.      PAST MEDICAL HISTORY :  Past Medical History:  Diagnosis Date   Arthritis    AV malformation of gastrointestinal tract    Blood in stool    CAD (coronary artery disease)    Carotid arterial disease (HCC)    Cataracts, bilateral    Chronic blood loss anemia    Chronic cystitis    CKD (chronic kidney disease), stage III (HCC)    Complication of anesthesia    reaction to propofol - caused heart to pause   Depression    Diabetes mellitus (HCC)    GERD (gastroesophageal reflux disease)    Hyperlipidemia    Hypertension    IDA (iron deficiency anemia)    Stress incontinence    TIA (transient ischemic attack) 03/2021   No deficits   Wears dentures    full upper and lower    PAST SURGICAL HISTORY :   Past Surgical History:  Procedure Laterality Date   ABDOMINAL HYSTERECTOMY  11/19/1970   ovaries left in place   APPENDECTOMY  11/19/1990   BACK SURGERY  06/08/2010   L3, L4, L5   CARDIAC CATHETERIZATION  02/2001   CAROTID ARTERY ANGIOPLASTY  09/19/2002   CARPAL TUNNEL RELEASE  1991, 92   right then left    CATARACT EXTRACTION W/PHACO Left 02/13/2022   Procedure: CATARACT EXTRACTION PHACO AND INTRAOCULAR LENS PLACEMENT (IOC) LEFT DIABETIC 14.90  01:26.8;  Surgeon: Birder Robson, MD;  Location: [facility];  Service: Ophthalmology;  Laterality: Left;  Diabetic   CATARACT EXTRACTION W/PHACO Right 03/06/2022   Procedure: CATARACT EXTRACTION PHACO AND INTRAOCULAR LENS  PLACEMENT (IOC) RIGHT DIABETIC 9.66 00:53.7;  Surgeon: Birder Robson, MD;  Location: [facility];  Service: Ophthalmology;  Laterality: Right;  Diabetic   CHOLECYSTECTOMY  11/20/1991   COLONOSCOPY WITH PROPOFOL N/A 02/02/2019   Procedure: COLONOSCOPY WITH PROPOFOL;  Surgeon: Manya Silvas, MD;  Location: [facility] ENDOSCOPY;  Service: Endoscopy;  Laterality: N/A;   ESOPHAGOGASTRODUODENOSCOPY N/A 02/02/2019   Procedure: ESOPHAGOGASTRODUODENOSCOPY (EGD);  Surgeon: Manya Silvas, MD;  Location[facility] ENDOSCOPY;  Service: Endoscopy;  Laterality: N/A;   FOOT SURGERY  06/20/2007   Left   right carotid artery surgery  01/09/2013   Dr. Delana Meyer @ [facility]   TOTAL HIP ARTHROPLASTY  05/03/2011   left 12, right 11/07    FAMILY HISTORY :   Family History  Problem Relation Age of Onset   Hyperlipidemia Father    Heart disease Father        myocardial infarction   Hypertension Father        Parent   Arthritis Other        parent   Diabetes Other        nephew   Cervical cancer Sister    Rectal cancer Sister    Breast cancer Neg Hx     SOCIAL HISTORY:   Social History   Tobacco Use   Smoking status: Former    Types: Cigarettes    Quit date: 11/19/1989    Years since quitting: 33.0   Smokeless tobacco: Never  Vaping Use   Vaping Use: Never used  Substance Use Topics   Alcohol use: No    Alcohol/week: 0.0 standard drinks of alcohol   Drug use: No    ALLERGIES:  is allergic to propofol, ciprofloxacin, levaquin [levofloxacin], and tequin [gatifloxacin].  MEDICATIONS:  Current Outpatient Medications  Medication Sig Dispense Refill   aspirin 81 MG tablet Take 81 mg by mouth daily.     Biotin 5000 MCG CAPS Take 1 capsule by mouth daily.     blood glucose meter kit and supplies KIT Dispense based on insurance preference. Use daily to check sugars once daily. Dx e11.9 1 each 0   cetirizine (ZYRTEC) 10 MG tablet Take 10 mg by mouth daily.     docusate sodium (COLACE) 100 MG  capsule Take 100 mg by mouth daily as needed for mild constipation.     ferrous sulfate 325 (65 FE) MG EC tablet Take 325 mg by mouth every other day. Every other day     finasteride (PROSCAR) 5 MG tablet TAKE 1 TABLET (5 MG TOTAL) BY MOUTH DAILY. 30 tablet 1   Lancets (ONETOUCH DELICA PLUS [id number]) MISC USE AS INSTRUCTED TO CHECK BLOOD SUGARS ONCE DAILY. DX E 11.9 100 each 12   Omega-3 Fatty Acids (FISH OIL) 1200 MG CAPS Take 1 capsule by mouth daily.     ONETOUCH ULTRA test strip USE AS INSTRUCTED TO CHECK BLOOD SUGARS ONCE DAILY. DX E11.9 100 strip 12   Probiotic, Lactobacillus, CAPS      rosuvastatin (CRESTOR) 40 MG tablet TAKE 1 TABLET BY MOUTH EVERY DAY 90 tablet 1   traZODone (DESYREL) 50 MG tablet TAKE 1/2 TO 1 TABLET BY MOUTH AT BEDTIME AS NEEDED FOR SLEEP 90 tablet 1   benzonatate (TESSALON) 100 MG capsule Take 1 capsule (100 mg total)  by mouth 2 (two) times daily as needed for cough. (Patient not taking: Reported on 12/11/2022) 20 capsule 0   guaiFENesin (MUCINEX) 600 MG 12 hr tablet Take by mouth 2 (two) times daily as needed. (Patient not taking: Reported on 12/11/2022)     No current facility-administered medications for this visit.    PHYSICAL EXAMINATION:   BP (!) 124/56 (BP Location: Left Arm, Patient Position: Sitting)   Pulse 79   Temp 98.4 F (36.9 C) (Tympanic)   Resp 18   Wt 139 lb 1.6 oz (63.1 kg)   SpO2 99%   BMI 28.09 kg/m   Filed Weights   12/11/22 1529  Weight: 139 lb 1.6 oz (63.1 kg)     Physical Exam Constitutional:      Comments: Frail-appearing Caucasian female patient.  She is in a wheelchair.  HENT:     Head: Normocephalic and atraumatic.     Mouth/Throat:     Pharynx: No oropharyngeal exudate.  Eyes:     Pupils: Pupils are equal, round, and reactive to light.     Comments: Positive for pallor.  Cardiovascular:     Rate and Rhythm: Normal rate and regular rhythm.  Pulmonary:     Effort: No respiratory distress.     Breath sounds: No  wheezing.  Abdominal:     General: Bowel sounds are normal. There is no distension.     Palpations: Abdomen is soft. There is no mass.     Tenderness: There is no abdominal tenderness. There is no guarding or rebound.  Musculoskeletal:        General: No tenderness. Normal range of motion.     Cervical back: Normal range of motion and neck supple.  Skin:    General: Skin is warm.  Neurological:     Mental Status: She is alert and oriented to person, place, and time.  Psychiatric:        Mood and Affect: Affect normal.     LABORATORY DATA:  I have reviewed the data as listed    Component Value Date/Time   NA 135 12/11/2022 1504   NA 143 01/10/2013 0314   K 3.7 12/11/2022 1504   K 4.0 01/10/2013 0314   CL 101 12/11/2022 1504   CL 109 (H) 01/10/2013 0314   CO2 24 12/11/2022 1504   CO2 27 01/10/2013 0314   GLUCOSE 197 (H) 12/11/2022 1504   GLUCOSE 119 (H) 01/10/2013 0314   BUN 21 12/11/2022 1504   BUN 15 01/10/2013 0314   CREATININE 1.23 (H) 12/11/2022 1504   CREATININE 1.03 01/10/2013 0314   CREATININE 1.13 (H) 09/18/2012 0848   CALCIUM 8.5 (L) 12/11/2022 1504   CALCIUM 8.1 (L) 01/10/2013 0314   PROT 6.3 07/16/2022 1106   PROT 7.2 12/20/2011 1521   ALBUMIN 3.9 07/16/2022 1106   ALBUMIN 3.9 12/20/2011 1521   AST 26 07/16/2022 1106   AST 31 12/20/2011 1521   ALT 26 07/16/2022 1106   ALT 37 12/20/2011 1521   ALKPHOS 45 07/16/2022 1106   ALKPHOS 57 12/20/2011 1521   BILITOT 0.3 07/16/2022 1106   BILITOT 0.5 12/20/2011 1521   GFRNONAA 42 (L) 12/11/2022 1504   GFRNONAA 52 (L) 01/10/2013 0314   GFRNONAA 47 (L) 09/18/2012 0848   GFRAA 46 (L) 07/12/2020 1240   GFRAA >60 01/10/2013 0314   GFRAA 54 (L) 09/18/2012 0848    No results found for: "SPEP", "UPEP"  Lab Results  Component Value Date   WBC 6.2 12/11/2022  NEUTROABS 4.1 12/11/2022   HGB 10.5 (L) 12/11/2022   HCT 32.4 (L) 12/11/2022   MCV 105.5 (H) 12/11/2022   PLT 171 12/11/2022      Chemistry       Component Value Date/Time   NA 135 12/11/2022 1504   NA 143 01/10/2013 0314   K 3.7 12/11/2022 1504   K 4.0 01/10/2013 0314   CL 101 12/11/2022 1504   CL 109 (H) 01/10/2013 0314   CO2 24 12/11/2022 1504   CO2 27 01/10/2013 0314   BUN 21 12/11/2022 1504   BUN 15 01/10/2013 0314   CREATININE 1.23 (H) 12/11/2022 1504   CREATININE 1.03 01/10/2013 0314   CREATININE 1.13 (H) 09/18/2012 0848      Component Value Date/Time   CALCIUM 8.5 (L) 12/11/2022 1504   CALCIUM 8.1 (L) 01/10/2013 0314   ALKPHOS 45 07/16/2022 1106   ALKPHOS 57 12/20/2011 1521   AST 26 07/16/2022 1106   AST 31 12/20/2011 1521   ALT 26 07/16/2022 1106   ALT 37 12/20/2011 1521   BILITOT 0.3 07/16/2022 1106   BILITOT 0.5 12/20/2011 1521         ASSESSMENT & PLAN:   Anemia of chronic kidney failure, stage 3 (moderate) (HCC) #Severe anemia hemoglobin-nadir 6 [summer 2020]. Likely CKD/iron deficiency [July 2021- Bone marrow biopsy-mild dyserythropoietic changes]. [place] 2023- NEGATIVE for hemolysis [haptoglobin-WNL]  # Today hemoglobin is  10.2 continue Feraheme monthly.  HOLD  Retacrit today.- stable.    # B12 def- FEB 2023- 232; start B12 injections every monthly. [march 2023] ; B12 levels - 723 [NOV 2023] Proceed with injection today. Stable.    # CKD stage III-GFR- 40s STABLE;  Recommend continued p.o. intake.  Stable.   # TIA: on plavix [Dr.Shah] on PT.stable.   # DISPOSITION:  #  Ferrahem today; B 12 injection # HOLD RETACRIT;   # in 4 weeks-  MD-  labs-  labs-cbc;bmp; LDH; possible Ferrahem or possible retacrit; B12 injection- Dr.B        Cammie Sickle, MD 12/11/2022 3:54 PM

## 2022-12-21 ENCOUNTER — Encounter: Payer: Self-pay | Admitting: Internal Medicine

## 2022-12-24 ENCOUNTER — Encounter: Payer: Self-pay | Admitting: Internal Medicine

## 2022-12-24 ENCOUNTER — Ambulatory Visit (INDEPENDENT_AMBULATORY_CARE_PROVIDER_SITE_OTHER): Payer: Medicare Other | Admitting: Internal Medicine

## 2022-12-24 VITALS — BP 120/74 | HR 88 | Temp 97.8°F | Resp 16 | Ht 59.0 in | Wt 138.4 lb

## 2022-12-24 DIAGNOSIS — I25119 Atherosclerotic heart disease of native coronary artery with unspecified angina pectoris: Secondary | ICD-10-CM

## 2022-12-24 DIAGNOSIS — I4891 Unspecified atrial fibrillation: Secondary | ICD-10-CM

## 2022-12-24 DIAGNOSIS — D649 Anemia, unspecified: Secondary | ICD-10-CM

## 2022-12-24 DIAGNOSIS — Z0000 Encounter for general adult medical examination without abnormal findings: Secondary | ICD-10-CM

## 2022-12-24 DIAGNOSIS — E7800 Pure hypercholesterolemia, unspecified: Secondary | ICD-10-CM

## 2022-12-24 DIAGNOSIS — I779 Disorder of arteries and arterioles, unspecified: Secondary | ICD-10-CM

## 2022-12-24 DIAGNOSIS — N183 Chronic kidney disease, stage 3 unspecified: Secondary | ICD-10-CM

## 2022-12-24 DIAGNOSIS — I700 Atherosclerosis of aorta: Secondary | ICD-10-CM

## 2022-12-24 DIAGNOSIS — E1159 Type 2 diabetes mellitus with other circulatory complications: Secondary | ICD-10-CM | POA: Diagnosis not present

## 2022-12-24 DIAGNOSIS — D696 Thrombocytopenia, unspecified: Secondary | ICD-10-CM

## 2022-12-24 NOTE — Assessment & Plan Note (Signed)
Physical today 12/24/22.   Mammogram 07/16/22 - Birads I. Missed her appt in 10/2017.

## 2022-12-24 NOTE — Progress Notes (Unsigned)
Subjective:    Patient ID: Jocelyn Elliott, female    DOB: [birth year], 89 y.o.   MRN: [id number]  Patient here for  Chief Complaint  Patient presents with   Annual Exam    HPI She is accompanied by her daughter.  History obtained from both of them.  Reports she is doing relatively well.  Able to do her ADLs.  No chest pain reported.  Breathing stable.  No increased cough or congestion.  No acid reflux reported.  No abdominal pain or bowel issues reported.  No bleeding.  Reviewed outside blood pressures and blood sugars.  Most am sugars 110-140.     Past Medical History:  Diagnosis Date   Arthritis    AV malformation of gastrointestinal tract    Blood in stool    CAD (coronary artery disease)    Carotid arterial disease (HCC)    Cataracts, bilateral    Chronic blood loss anemia    Chronic cystitis    CKD (chronic kidney disease), stage III (HCC)    Complication of anesthesia    reaction to propofol - caused heart to pause   Depression    Diabetes mellitus (HCC)    GERD (gastroesophageal reflux disease)    Hyperlipidemia    Hypertension    IDA (iron deficiency anemia)    Stress incontinence    TIA (transient ischemic attack) 03/2021   No deficits   Wears dentures    full upper and lower   Past Surgical History:  Procedure Laterality Date   ABDOMINAL HYSTERECTOMY  11/19/1970   ovaries left in place   APPENDECTOMY  11/19/1990   BACK SURGERY  06/08/2010   L3, L4, L5   CARDIAC CATHETERIZATION  02/2001   CAROTID ARTERY ANGIOPLASTY  09/19/2002   CARPAL TUNNEL RELEASE  1991, 92   right then left    CATARACT EXTRACTION W/PHACO Left 02/13/2022   Procedure: CATARACT EXTRACTION PHACO AND INTRAOCULAR LENS PLACEMENT ([place]) LEFT DIABETIC 14.90 01:26.8;  Surgeon: Birder Robson, MD;  Location: [place];  Service: Ophthalmology;  Laterality: Left;  Diabetic   CATARACT EXTRACTION W/PHACO Right 03/06/2022   Procedure: CATARACT EXTRACTION PHACO AND INTRAOCULAR LENS  PLACEMENT ([facility]) RIGHT DIABETIC 9.66 00:53.7;  Surgeon: Birder Robson, MD;  Location: [place];  Service: Ophthalmology;  Laterality: Right;  Diabetic   CHOLECYSTECTOMY  11/20/1991   COLONOSCOPY WITH PROPOFOL N/A 02/02/2019   Procedure: COLONOSCOPY WITH PROPOFOL;  Surgeon: Manya Silvas, MD;  [facility] ENDOSCOPY;  Service: Endoscopy;  Laterality: N/A;   ESOPHAGOGASTRODUODENOSCOPY N/A 02/02/2019   Procedure: ESOPHAGOGASTRODUODENOSCOPY (EGD);  Surgeon: Manya Silvas, MD;  Location: [facility] ENDOSCOPY;  Service: Endoscopy;  Laterality: N/A;   FOOT SURGERY  06/20/2007   Left   right carotid artery surgery  01/09/2013   Dr. Delana Meyer @ [facility]   TOTAL HIP ARTHROPLASTY  05/03/2011   left 12, right 11/07   Family History  Problem Relation Age of Onset   Hyperlipidemia Father    Heart disease Father        myocardial infarction   Hypertension Father        Parent   Arthritis Other        parent   Diabetes Other        nephew   Cervical cancer Sister    Rectal cancer Sister    Breast cancer Neg Hx    Social History   Socioeconomic History   Marital status: Widowed    Spouse name: Not on file  Number of children: 1   Years of education: 13   Highest education level: Not on file  Occupational History   Occupation: Retired  Tobacco Use   Smoking status: Former    Types: Cigarettes    Quit date: 11/19/1989    Years since quitting: 33.1   Smokeless tobacco: Never  Vaping Use   Vaping Use: Never used  Substance and Sexual Activity   Alcohol use: No    Alcohol/week: 0.0 standard drinks of alcohol   Drug use: No   Sexual activity: Not Currently  Other Topics Concern   Not on file  Social History Narrative   Regular exercise-no   Caffeine Use-yes   Social Determinants of Health   Financial Resource Strain: Low Risk  (03/27/2022)   Overall Financial Resource Strain (CARDIA)    Difficulty of Paying Living Expenses: Not hard at all  Food Insecurity: No Food  Insecurity (03/27/2022)   Hunger Vital Sign    Worried About Running Out of Food in the Last Year: Never true    [place] in the Last Year: Never true  Transportation Needs: No Transportation Needs (03/27/2022)   PRAPARE - Hydrologist (Medical): No    Lack of Transportation (Non-Medical): No  Physical Activity: Not on file  Stress: No Stress Concern Present (03/27/2022)   [place]    Feeling of Stress : Not at all  Social Connections: Unknown (03/27/2022)   Social Connection and Isolation Panel [NHANES]    Frequency of Communication with Friends and Family: More than three times a week    Frequency of Social Gatherings with Friends and Family: Three times a week    Attends Religious Services: Not on file    Active Member of Clubs or Organizations: Not on file    Attends Club or Organization Meetings: Not on file    Marital Status: Widowed     Review of Systems  Constitutional:  Negative for appetite change and unexpected weight change.  HENT:  Negative for congestion, sinus pressure and sore throat.   Eyes:  Negative for pain and visual disturbance.  Respiratory:  Negative for cough, chest tightness and shortness of breath.   Cardiovascular:  Negative for chest pain, palpitations and leg swelling.  Gastrointestinal:  Negative for abdominal pain, diarrhea, nausea and vomiting.  Genitourinary:  Negative for difficulty urinating and dysuria.  Musculoskeletal:  Negative for joint swelling and myalgias.  Skin:  Negative for color change and rash.  Neurological:  Negative for dizziness and headaches.  Hematological:  Negative for adenopathy. Does not bruise/bleed easily.  Psychiatric/Behavioral:  Negative for agitation and dysphoric mood.        Objective:     BP 120/74   Pulse 88   Temp 97.8 F (36.6 C)   Resp 16   Ht 4\' 11"  (1.499 m)   Wt 138 lb 6.4 oz (62.8 kg)   SpO2 98%    BMI 27.95 kg/m  Wt Readings from Last 3 Encounters:  12/24/22 138 lb 6.4 oz (62.8 kg)  12/11/22 139 lb 1.6 oz (63.1 kg)  11/13/22 141 lb 3.2 oz (64 kg)    Physical Exam Vitals reviewed.  Constitutional:      General: She is not in acute distress.    Appearance: Normal appearance. She is well-developed.  HENT:     Head: Normocephalic and atraumatic.     Right Ear: External ear normal.  Left Ear: External ear normal.  Eyes:     General: No scleral icterus.       Right eye: No discharge.        Left eye: No discharge.     Conjunctiva/sclera: Conjunctivae normal.  Neck:     Thyroid: No thyromegaly.  Cardiovascular:     Rate and Rhythm: Normal rate and regular rhythm.  Pulmonary:     Effort: No tachypnea, accessory muscle usage or respiratory distress.     Breath sounds: Normal breath sounds. No decreased breath sounds or wheezing.  Chest:  Breasts:    Right: No inverted nipple, mass, nipple discharge or tenderness (no axillary adenopathy).     Left: No inverted nipple, mass, nipple discharge or tenderness (no axilarry adenopathy).  Abdominal:     General: Bowel sounds are normal.     Palpations: Abdomen is soft.     Tenderness: There is no abdominal tenderness.  Musculoskeletal:        General: No swelling or tenderness.     Cervical back: Neck supple.  Lymphadenopathy:     Cervical: No cervical adenopathy.  Skin:    Findings: No erythema or rash.  Neurological:     Mental Status: She is alert and oriented to person, place, and time.  Psychiatric:        Mood and Affect: Mood normal.        Behavior: Behavior normal.      Outpatient Encounter Medications as of 12/24/2022  Medication Sig   aspirin 81 MG tablet Take 81 mg by mouth daily.   Biotin 5000 MCG CAPS Take 1 capsule by mouth daily.   blood glucose meter kit and supplies KIT Dispense based on insurance preference. Use daily to check sugars once daily. Dx e11.9   cetirizine (ZYRTEC) 10 MG tablet Take 10 mg  by mouth daily.   docusate sodium (COLACE) 100 MG capsule Take 100 mg by mouth daily as needed for mild constipation.   ferrous sulfate 325 (65 FE) MG EC tablet Take 325 mg by mouth every other day. Every other day   finasteride (PROSCAR) 5 MG tablet TAKE 1 TABLET (5 MG TOTAL) BY MOUTH DAILY.   Lancets (ONETOUCH DELICA PLUS EPPIRJ18A) MISC USE AS INSTRUCTED TO CHECK BLOOD SUGARS ONCE DAILY. DX E 11.9   Omega-3 Fatty Acids (FISH OIL) 1200 MG CAPS Take 1 capsule by mouth daily.   ONETOUCH ULTRA test strip USE AS INSTRUCTED TO CHECK BLOOD SUGARS ONCE DAILY. DX E11.9   Probiotic, Lactobacillus, CAPS    rosuvastatin (CRESTOR) 40 MG tablet TAKE 1 TABLET BY MOUTH EVERY DAY   traZODone (DESYREL) 50 MG tablet TAKE 1/2 TO 1 TABLET BY MOUTH AT BEDTIME AS NEEDED FOR SLEEP   [DISCONTINUED] benzonatate (TESSALON) 100 MG capsule Take 1 capsule (100 mg total) by mouth 2 (two) times daily as needed for cough. (Patient not taking: Reported on 12/11/2022)   [DISCONTINUED] guaiFENesin (MUCINEX) 600 MG 12 hr tablet Take by mouth 2 (two) times daily as needed. (Patient not taking: Reported on 12/11/2022)   No facility-administered encounter medications on file as of 12/24/2022.     Lab Results  Component Value Date   WBC 6.2 12/11/2022   HGB 10.5 (L) 12/11/2022   HCT 32.4 (L) 12/11/2022   PLT 171 12/11/2022   GLUCOSE 197 (H) 12/11/2022   CHOL 145 12/24/2022   TRIG 118.0 12/24/2022   HDL 69.60 12/24/2022   LDLDIRECT 51.0 04/14/2019   LDLCALC 52 12/24/2022   ALT  28 12/24/2022   AST 36 12/24/2022   NA 135 12/11/2022   K 3.7 12/11/2022   CL 101 12/11/2022   CREATININE 1.23 (H) 12/11/2022   BUN 21 12/11/2022   CO2 24 12/11/2022   TSH 1.82 07/16/2022   INR 1.0 06/15/2019   HGBA1C 5.1 12/24/2022   MICROALBUR 12.7 (H) 07/16/2022    MM 3D SCREEN BREAST BILATERAL  Result Date: 07/18/2022 CLINICAL DATA:  Screening. EXAM: DIGITAL SCREENING BILATERAL MAMMOGRAM WITH TOMOSYNTHESIS AND CAD TECHNIQUE: Bilateral  screening digital craniocaudal and mediolateral oblique mammograms were obtained. Bilateral screening digital breast tomosynthesis was performed. The images were evaluated with computer-aided detection. COMPARISON:  Previous exam(s). ACR Breast Density Category c: The breast tissue is heterogeneously dense, which may obscure small masses. FINDINGS: There are no findings suspicious for malignancy. IMPRESSION: No mammographic evidence of malignancy. A result letter of this screening mammogram will be mailed directly to the patient. RECOMMENDATION: Screening mammogram in one year. (Code:SM-B-01Y) BI-RADS CATEGORY  1: Negative. Electronically Signed   By: Everlean Alstrom M.D.   On: 07/18/2022 09:56       Assessment & Plan:  Routine general medical examination at a health care facility  Type 2 diabetes mellitus with other circulatory complication, without long-term current use of insulin (HCC) Assessment & Plan: Sugars have been under reasonable control.  Outside sugars reviewed.  Most range - 110-140.  Follow.   Orders: -     Hemoglobin A1c  Hypercholesteremia Assessment & Plan: Continue crestor.  Low cholesterol diet and exercise.  Follow lipid panel and liver function tests.    Orders: -     Lipid panel -     Hepatic function panel  Health care maintenance Assessment & Plan: Physical today 12/24/22.   Mammogram 07/16/22 - Birads I. Missed her appt in 10/2017.     Atrial fibrillation, unspecified type [facility]) Assessment & Plan: Continues on aspirin.  Denies any increased heart rate or palpitations.  Follow.    Anemia, unspecified type Assessment & Plan: Followed by hematology.  History of AVM.  Also with CKD.  Receiving IV iron.     Aortic atherosclerosis ([place]) Assessment & Plan: Continue crestor.      Carotid artery disease, unspecified laterality, unspecified type ([place]) Assessment & Plan:  Continue antiplatelet therapy as prescribed Continue management of CAD, HTN and  Hyperlipidemia   Stage 3 chronic kidney disease, unspecified whether stage 3a or 3b CKD ([place]) Assessment & Plan: Continue to avoid antiinflammatories.  Stay hydrated.  Follow metabolic panel. Off ace inhibitor due to low blood pressure.    Coronary artery disease involving native coronary artery of native heart with angina pectoris [facility]) Assessment & Plan: Off imdur due to low blood pressure.  Remains on crestor.  Currently without symptoms.  Follow    Thrombocytopenia (HCC) Assessment & Plan: Follow cbc.  Being followed by hematology.       Einar Pheasant, MD

## 2022-12-25 ENCOUNTER — Encounter: Payer: Self-pay | Admitting: Internal Medicine

## 2022-12-25 LAB — LIPID PANEL
Cholesterol: 145 mg/dL (ref 0–200)
HDL: 69.6 mg/dL (ref >39.00)
LDL Cholesterol: 52 mg/dL (ref 0–99)
NonHDL: 75.16
Total CHOL/HDL Ratio: 2
Triglycerides: 118 mg/dL (ref 0.0–149.0)
VLDL: 23.6 mg/dL (ref 0.0–40.0)

## 2022-12-25 LAB — HEMOGLOBIN A1C: Hgb A1c MFr Bld: 5.1 % (ref 4.6–6.5)

## 2022-12-25 LAB — HEPATIC FUNCTION PANEL
ALT: 28 U/L (ref 0–35)
AST: 36 U/L (ref 0–37)
Albumin: 4.2 g/dL (ref 3.5–5.2)
Alkaline Phosphatase: 57 U/L (ref 39–117)
Bilirubin, Direct: 0.1 mg/dL (ref 0.0–0.3)
Total Bilirubin: 0.4 mg/dL (ref 0.2–1.2)
Total Protein: 6.7 g/dL (ref 6.0–8.3)

## 2022-12-25 NOTE — Assessment & Plan Note (Signed)
Continues on aspirin.  Denies any increased heart rate or palpitations.  Follow.

## 2022-12-25 NOTE — Assessment & Plan Note (Signed)
Continue crestor 

## 2022-12-25 NOTE — Assessment & Plan Note (Signed)
Continue to avoid antiinflammatories.  Stay hydrated.  Follow metabolic panel. Off ace inhibitor due to low blood pressure.

## 2022-12-25 NOTE — Assessment & Plan Note (Signed)
Continue antiplatelet therapy as prescribed Continue management of CAD, HTN and Hyperlipidemia

## 2022-12-25 NOTE — Assessment & Plan Note (Addendum)
Sugars have been under reasonable control.  Outside sugars reviewed.  Most range - 110-140.  Follow.

## 2022-12-25 NOTE — Assessment & Plan Note (Signed)
Follow cbc.  Being followed by hematology.

## 2022-12-25 NOTE — Assessment & Plan Note (Signed)
Followed by hematology.  History of AVM.  Also with CKD.  Receiving IV iron.

## 2022-12-25 NOTE — Assessment & Plan Note (Signed)
Off imdur due to low blood pressure.  Remains on crestor.  Currently without symptoms.  Follow

## 2022-12-25 NOTE — Assessment & Plan Note (Signed)
Continue crestor.  Low cholesterol diet and exercise. Follow lipid panel and liver function tests.   

## 2022-12-30 ENCOUNTER — Other Ambulatory Visit: Payer: Self-pay | Admitting: Internal Medicine

## 2023-01-07 ENCOUNTER — Other Ambulatory Visit: Payer: Self-pay | Admitting: Dermatology

## 2023-01-07 DIAGNOSIS — L649 Androgenic alopecia, unspecified: Secondary | ICD-10-CM

## 2023-01-08 ENCOUNTER — Inpatient Hospital Stay: Payer: Medicare Other | Attending: Internal Medicine

## 2023-01-08 ENCOUNTER — Encounter: Payer: Self-pay | Admitting: Internal Medicine

## 2023-01-08 ENCOUNTER — Inpatient Hospital Stay (HOSPITAL_BASED_OUTPATIENT_CLINIC_OR_DEPARTMENT_OTHER): Payer: Medicare Other | Admitting: Internal Medicine

## 2023-01-08 ENCOUNTER — Inpatient Hospital Stay: Payer: Medicare Other

## 2023-01-08 VITALS — BP 156/65 | HR 78 | Temp 97.6°F | Resp 18

## 2023-01-08 VITALS — BP 147/51 | HR 73 | Resp 18 | Ht 59.0 in | Wt 140.0 lb

## 2023-01-08 DIAGNOSIS — N183 Chronic kidney disease, stage 3 unspecified: Secondary | ICD-10-CM

## 2023-01-08 DIAGNOSIS — D631 Anemia in chronic kidney disease: Secondary | ICD-10-CM | POA: Insufficient documentation

## 2023-01-08 DIAGNOSIS — D508 Other iron deficiency anemias: Secondary | ICD-10-CM

## 2023-01-08 DIAGNOSIS — D649 Anemia, unspecified: Secondary | ICD-10-CM

## 2023-01-08 DIAGNOSIS — Z7902 Long term (current) use of antithrombotics/antiplatelets: Secondary | ICD-10-CM | POA: Diagnosis not present

## 2023-01-08 DIAGNOSIS — E1122 Type 2 diabetes mellitus with diabetic chronic kidney disease: Secondary | ICD-10-CM | POA: Insufficient documentation

## 2023-01-08 DIAGNOSIS — G459 Transient cerebral ischemic attack, unspecified: Secondary | ICD-10-CM | POA: Insufficient documentation

## 2023-01-08 DIAGNOSIS — I129 Hypertensive chronic kidney disease with stage 1 through stage 4 chronic kidney disease, or unspecified chronic kidney disease: Secondary | ICD-10-CM | POA: Insufficient documentation

## 2023-01-08 LAB — BASIC METABOLIC PANEL
Anion gap: 8 (ref 5–15)
BUN: 21 mg/dL (ref 8–23)
CO2: 25 mmol/L (ref 22–32)
Calcium: 8.9 mg/dL (ref 8.9–10.3)
Chloride: 107 mmol/L (ref 98–111)
Creatinine, Ser: 1.28 mg/dL — ABNORMAL HIGH (ref 0.44–1.00)
GFR, Estimated: 40 mL/min — ABNORMAL LOW (ref >60)
Glucose, Bld: 177 mg/dL — ABNORMAL HIGH (ref 70–99)
Potassium: 4.2 mmol/L (ref 3.5–5.1)
Sodium: 140 mmol/L (ref 135–145)

## 2023-01-08 LAB — CBC WITH DIFFERENTIAL/PLATELET
Abs Immature Granulocytes: 0.02 10*3/uL (ref 0.00–0.07)
Basophils Absolute: 0.1 10*3/uL (ref 0.0–0.1)
Basophils Relative: 1 %
Eosinophils Absolute: 0.2 10*3/uL (ref 0.0–0.5)
Eosinophils Relative: 3 %
HCT: 33.2 % — ABNORMAL LOW (ref 36.0–46.0)
Hemoglobin: 10.7 g/dL — ABNORMAL LOW (ref 12.0–15.0)
Immature Granulocytes: 0 %
Lymphocytes Relative: 18 %
Lymphs Abs: 1.2 10*3/uL (ref 0.7–4.0)
MCH: 34.3 pg — ABNORMAL HIGH (ref 26.0–34.0)
MCHC: 32.2 g/dL (ref 30.0–36.0)
MCV: 106.4 fL — ABNORMAL HIGH (ref 80.0–100.0)
Monocytes Absolute: 0.4 10*3/uL (ref 0.1–1.0)
Monocytes Relative: 6 %
Neutro Abs: 4.5 10*3/uL (ref 1.7–7.7)
Neutrophils Relative %: 72 %
Platelets: 142 10*3/uL — ABNORMAL LOW (ref 150–400)
RBC: 3.12 MIL/uL — ABNORMAL LOW (ref 3.87–5.11)
RDW: 13.6 % (ref 11.5–15.5)
WBC: 6.4 10*3/uL (ref 4.0–10.5)
nRBC: 0 % (ref 0.0–0.2)

## 2023-01-08 LAB — LACTATE DEHYDROGENASE: LDH: 178 U/L (ref 98–192)

## 2023-01-08 MED ORDER — SODIUM CHLORIDE 0.9 % IV SOLN
Freq: Once | INTRAVENOUS | Status: AC
  Filled 2023-01-08: qty 250

## 2023-01-08 MED ORDER — SODIUM CHLORIDE 0.9 % IV SOLN
510.0000 mg | Freq: Once | INTRAVENOUS | Status: AC
  Administered 2023-01-08: 510 mg via INTRAVENOUS
  Filled 2023-01-08: qty 510

## 2023-01-08 MED ORDER — CYANOCOBALAMIN 1000 MCG/ML IJ SOLN
1000.0000 ug | Freq: Once | INTRAMUSCULAR | Status: AC
  Administered 2023-01-08: 1000 ug via INTRAMUSCULAR
  Filled 2023-01-08: qty 1

## 2023-01-08 NOTE — Progress Notes (Signed)
[facility] OFFICE PROGRESS NOTE  Patient Care Team: Einar Pheasant, MD as PCP - General (Internal Medicine) Einar Pheasant, MD (Internal Medicine) Isaias Cowman, MD (Internal Medicine) Brendolyn Patty, MD (Specialist) Schnier, Dolores Lory, MD (Vascular Surgery) Isaias Cowman, MD (Internal Medicine) Brendolyn Patty, MD (Specialist) Schnier, Dolores Lory, MD (Vascular Surgery) Cammie Sickle, MD as Consulting Physician (Oncology)   SUMMARY OF HEMATOLOGIC HISTORY:  # IRON DEFICIENCY ANEMIA- recurrent [? GI blood loss; Dr.Elliot; AVM-capsule]; IV Ferrahem q 22m last colo- 2012/march; EGD-TK:[id number]  July 2020 bone marrow biopsy-mild dysplastic changes; 40% hypercellularity; cytogenetics-normal.-Retacrit.MARCH 2023- [facility] Hem- TP53 subclonal. B12 def- FEB 2023- Start B12 injections Monthly; MAY 3rd, 2023- Start Retacrit 20K [place].   #Colonoscopy-April 2020 aborted because of cardiac pause.  # CKD stage III-IV; CAD-Dr. PSaralyn Pilar  March 30th, 2022-TIA/slurred spleech [Dr.Shah]  INTERVAL HISTORY: In a wheelchair.  With her daughter.    89year old female patient with above history of recurrent iron deficiency anemia unclear etiology/?-related to CKD-on Feraheme/Retacrit is here for follow-up.  Pt doing well. Takes oral iron every other day. Denies dizziness or fatigue. No visible bleeding. Stools are dark from iron.   Patient denies any nausea vomiting. Patient admits to improvement of her fatigue.   Review of Systems  Constitutional:  Positive for malaise/fatigue. Negative for chills, diaphoresis, fever and weight loss.  HENT:  Negative for nosebleeds and sore throat.   Eyes:  Negative for double vision.  Respiratory:  Negative for cough, hemoptysis, sputum production and wheezing.   Cardiovascular:  Negative for palpitations, orthopnea and leg swelling.  Gastrointestinal:  Negative for abdominal pain, blood in stool, constipation, diarrhea,  heartburn, melena, nausea and vomiting.  Genitourinary:  Negative for dysuria, frequency and urgency.  Musculoskeletal:  Negative for back pain and joint pain.  Skin: Negative.  Negative for itching and rash.  Neurological:  Negative for dizziness, tingling, focal weakness, weakness and headaches.  Endo/Heme/Allergies:  Does not bruise/bleed easily.  Psychiatric/Behavioral:  Negative for depression. The patient is not nervous/anxious and does not have insomnia.      PAST MEDICAL HISTORY :  Past Medical History:  Diagnosis Date   Arthritis    AV malformation of gastrointestinal tract    Blood in stool    CAD (coronary artery disease)    Carotid arterial disease (HCC)    Cataracts, bilateral    Chronic blood loss anemia    Chronic cystitis    CKD (chronic kidney disease), stage III (HCC)    Complication of anesthesia    reaction to propofol - caused heart to pause   Depression    Diabetes mellitus (HCC)    GERD (gastroesophageal reflux disease)    Hyperlipidemia    Hypertension    IDA (iron deficiency anemia)    Stress incontinence    TIA (transient ischemic attack) 03/2021   No deficits   Wears dentures    full upper and lower    PAST SURGICAL HISTORY :   Past Surgical History:  Procedure Laterality Date   ABDOMINAL HYSTERECTOMY  11/19/1970   ovaries left in place   APPENDECTOMY  11/19/1990   BACK SURGERY  06/08/2010   L3, L4, L5   CARDIAC CATHETERIZATION  02/2001   CAROTID ARTERY ANGIOPLASTY  09/19/2002   CARPAL TUNNEL RELEASE  1991, 92   right then left    CATARACT EXTRACTION W/PHACO Left 02/13/2022   Procedure: CATARACT EXTRACTION PHACO AND INTRAOCULAR LENS PLACEMENT ([facility]) LEFT DIABETIC 14.90 01:26.8;  Surgeon: PBirder Robson MD;  Location: [place];  Service: Ophthalmology;  Laterality: Left;  Diabetic   CATARACT EXTRACTION W/PHACO Right 03/06/2022   Procedure: CATARACT EXTRACTION PHACO AND INTRAOCULAR LENS PLACEMENT ([facility]) RIGHT DIABETIC 9.66  00:53.7;  Surgeon: Birder Robson, MD;  Location: [place];  Service: Ophthalmology;  Laterality: Right;  Diabetic   CHOLECYSTECTOMY  11/20/1991   COLONOSCOPY WITH PROPOFOL N/A 02/02/2019   Procedure: COLONOSCOPY WITH PROPOFOL;  Surgeon: Manya Silvas, MD;  Location: [facility] ENDOSCOPY;  Service: Endoscopy;  Laterality: N/A;   ESOPHAGOGASTRODUODENOSCOPY N/A 02/02/2019   Procedure: ESOPHAGOGASTRODUODENOSCOPY ([facility]);  Surgeon: Manya Silvas, MD;  Location: [facility] ENDOSCOPY;  Service: Endoscopy;  Laterality: N/A;   FOOT SURGERY  06/20/2007   Left   right carotid artery surgery  01/09/2013   Dr. Delana Meyer @ [facility]   TOTAL HIP ARTHROPLASTY  05/03/2011   left 12, right 11/07    FAMILY HISTORY :   Family History  Problem Relation Age of Onset   Hyperlipidemia Father    Heart disease Father        myocardial infarction   Hypertension Father        Parent   Arthritis Other        parent   Diabetes Other        nephew   Cervical cancer Sister    Rectal cancer Sister    Breast cancer Neg Hx     SOCIAL HISTORY:   Social History   Tobacco Use   Smoking status: Former    Types: Cigarettes    Quit date: 11/19/1989    Years since quitting: 33.1   Smokeless tobacco: Never  Vaping Use   Vaping Use: Never used  Substance Use Topics   Alcohol use: No    Alcohol/week: 0.0 standard drinks of alcohol   Drug use: No    ALLERGIES:  is allergic to propofol, ciprofloxacin, levaquin [levofloxacin], and tequin [gatifloxacin].  MEDICATIONS:  Current Outpatient Medications  Medication Sig Dispense Refill   aspirin 81 MG tablet Take 81 mg by mouth daily.     Biotin 5000 MCG CAPS Take 1 capsule by mouth daily.     blood glucose meter kit and supplies KIT Dispense based on insurance preference. Use daily to check sugars once daily. Dx e11.9 1 each 0   cetirizine (ZYRTEC) 10 MG tablet Take 10 mg by mouth daily.     docusate sodium (COLACE) 100 MG capsule Take 100 mg by mouth daily as  needed for mild constipation.     ferrous sulfate 325 (65 FE) MG EC tablet Take 325 mg by mouth every other day. Every other day     finasteride (PROSCAR) 5 MG tablet TAKE 1 TABLET (5 MG TOTAL) BY MOUTH DAILY. 30 tablet 0   Lancets (ONETOUCH DELICA PLUS Q000111Q) MISC USE AS INSTRUCTED TO CHECK BLOOD SUGARS ONCE DAILY. DX E 11.9 100 each 12   Omega-3 Fatty Acids (FISH OIL) 1200 MG CAPS Take 1 capsule by mouth daily.     ONETOUCH ULTRA test strip USE AS INSTRUCTED TO CHECK BLOOD SUGARS ONCE DAILY. 100 strip 12   Probiotic, Lactobacillus, CAPS      rosuvastatin (CRESTOR) 40 MG tablet TAKE 1 TABLET BY MOUTH EVERY DAY 90 tablet 1   traZODone (DESYREL) 50 MG tablet TAKE 1/2 TO 1 TABLET BY MOUTH AT BEDTIME AS NEEDED FOR SLEEP 90 tablet 1   No current facility-administered medications for this visit.    PHYSICAL EXAMINATION:   BP (!) 147/51 (BP Location: Left  Arm, Patient Position: Sitting)   Pulse 73   Resp 18   Ht 4' 11"$  (1.499 m)   Wt 140 lb (63.5 kg)   SpO2 96%   BMI 28.28 kg/m   Filed Weights   01/08/23 1520  Weight: 140 lb (63.5 kg)     Physical Exam Constitutional:      Comments: [place] Caucasian female patient.  She is in a wheelchair.  HENT:     Head: Normocephalic and atraumatic.     Mouth/Throat:     Pharynx: No oropharyngeal exudate.  Eyes:     Pupils: Pupils are equal, round, and reactive to light.     Comments: Positive for pallor.  Cardiovascular:     Rate and Rhythm: Normal rate and regular rhythm.  Pulmonary:     Effort: No respiratory distress.     Breath sounds: No wheezing.  Abdominal:     General: Bowel sounds are normal. There is no distension.     Palpations: Abdomen is soft. There is no mass.     Tenderness: There is no abdominal tenderness. There is no guarding or rebound.  Musculoskeletal:        General: No tenderness. Normal range of motion.     Cervical back: Normal range of motion and neck supple.  Skin:    General: Skin is warm.   Neurological:     Mental Status: She is alert and oriented to person, place, and time.  Psychiatric:        Mood and Affect: Affect normal.     LABORATORY DATA:  I have reviewed the data as listed    Component Value Date/Time   NA 140 01/08/2023 1505   NA 143 01/10/2013 0314   K 4.2 01/08/2023 1505   K 4.0 01/10/2013 0314   CL 107 01/08/2023 1505   CL 109 (H) 01/10/2013 0314   CO2 25 01/08/2023 1505   CO2 27 01/10/2013 0314   GLUCOSE 177 (H) 01/08/2023 1505   GLUCOSE 119 (H) 01/10/2013 0314   BUN 21 01/08/2023 1505   BUN 15 01/10/2013 0314   CREATININE 1.28 (H) 01/08/2023 1505   CREATININE 1.03 01/10/2013 0314   CREATININE 1.13 (H) 09/18/2012 0848   CALCIUM 8.9 01/08/2023 1505   CALCIUM 8.1 (L) 01/10/2013 0314   PROT 6.7 12/24/2022 1521   PROT 7.2 12/20/2011 1521   ALBUMIN 4.2 12/24/2022 1521   ALBUMIN 3.9 12/20/2011 1521   AST 36 12/24/2022 1521   AST 31 12/20/2011 1521   ALT 28 12/24/2022 1521   ALT 37 12/20/2011 1521   ALKPHOS 57 12/24/2022 1521   ALKPHOS 57 12/20/2011 1521   BILITOT 0.4 12/24/2022 1521   BILITOT 0.5 12/20/2011 1521   GFRNONAA 40 (L) 01/08/2023 1505   GFRNONAA 52 (L) 01/10/2013 0314   GFRNONAA 47 (L) 09/18/2012 0848   GFRAA 46 (L) 07/12/2020 1240   GFRAA >60 01/10/2013 0314   GFRAA 54 (L) 09/18/2012 0848    No results found for: "SPEP", "UPEP"  Lab Results  Component Value Date   WBC 6.4 01/08/2023   NEUTROABS 4.5 01/08/2023   HGB 10.7 (L) 01/08/2023   HCT 33.2 (L) 01/08/2023   MCV 106.4 (H) 01/08/2023   PLT 142 (L) 01/08/2023      Chemistry      Component Value Date/Time   NA 140 01/08/2023 1505   NA 143 01/10/2013 0314   K 4.2 01/08/2023 1505   K 4.0 01/10/2013 0314   CL 107 01/08/2023 1505  CL 109 (H) 01/10/2013 0314   CO2 25 01/08/2023 1505   CO2 27 01/10/2013 0314   BUN 21 01/08/2023 1505   BUN 15 01/10/2013 0314   CREATININE 1.28 (H) 01/08/2023 1505   CREATININE 1.03 01/10/2013 0314   CREATININE 1.13 (H)  09/18/2012 0848      Component Value Date/Time   CALCIUM 8.9 01/08/2023 1505   CALCIUM 8.1 (L) 01/10/2013 0314   ALKPHOS 57 12/24/2022 1521   ALKPHOS 57 12/20/2011 1521   AST 36 12/24/2022 1521   AST 31 12/20/2011 1521   ALT 28 12/24/2022 1521   ALT 37 12/20/2011 1521   BILITOT 0.4 12/24/2022 1521   BILITOT 0.5 12/20/2011 1521      ASSESSMENT & PLAN:   Anemia of chronic kidney failure, stage 3 (moderate) (HCC) #Severe anemia hemoglobin-nadir 6 [summer 2020]. Likely CKD/iron deficiency [July 2021- Bone marrow biopsy-mild dyserythropoietic changes]. [place] 2023- NEGATIVE for hemolysis [haptoglobin-WNL]  # Today hemoglobin is  10.7- continue Feraheme monthly.  HOLD Retacrit today.- stable.  I sat-17; ferritin-81- NOV 2023.   # B12 def- FEB 2023- 232; continue B12 injections every monthly. [march 2023] ; B12 levels - 723 [NOV 2023] Proceed with injection today. Stable.    # CKD stage III-GFR- 40s Recommend continued p.o. intake.  Stable.   # TIA: on plavix [Dr.Shah]- stable.   # DISPOSITION:  #  Ferrahem today; B 12 injection # HOLD RETACRIT;   # in 4 weeks-  MD-  labs- -cbc;bmp; LDH; iron studies;ferritin; B12  possible Ferrahem or possible retacrit; B12 injection- Dr.B      Cammie Sickle, MD 01/08/2023 3:57 PM

## 2023-01-08 NOTE — Assessment & Plan Note (Addendum)
#  Severe anemia hemoglobin-nadir 6 [summer 2020]. Likely CKD/iron deficiency [July 2021- Bone marrow biopsy-mild dyserythropoietic changes]. [place] 2023- NEGATIVE for hemolysis [haptoglobin-WNL]  # Today hemoglobin is  10.7- continue Feraheme monthly.  HOLD Retacrit today.- stable.  I sat-17; ferritin-81- NOV 2023.   # B12 def- FEB 2023- 232; continue B12 injections every monthly. [march 2023] ; B12 levels - 723 [NOV 2023] Proceed with injection today. Stable.    # CKD stage III-GFR- 40s Recommend continued p.o. intake.  Stable.   # TIA: on plavix [Dr.Shah]- stable.   # DISPOSITION:  #  Ferrahem today; B 12 injection # HOLD RETACRIT;   # in 4 weeks-  MD-  labs- -cbc;bmp; LDH; iron studies;ferritin; B12  possible Ferrahem or possible retacrit; B12 injection- Dr.B

## 2023-01-30 ENCOUNTER — Other Ambulatory Visit: Payer: Self-pay | Admitting: Internal Medicine

## 2023-02-05 ENCOUNTER — Inpatient Hospital Stay: Payer: Medicare Other

## 2023-02-05 ENCOUNTER — Inpatient Hospital Stay (HOSPITAL_BASED_OUTPATIENT_CLINIC_OR_DEPARTMENT_OTHER): Payer: Medicare Other | Admitting: Internal Medicine

## 2023-02-05 ENCOUNTER — Inpatient Hospital Stay: Payer: Medicare Other | Attending: Internal Medicine

## 2023-02-05 ENCOUNTER — Encounter: Payer: Self-pay | Admitting: Internal Medicine

## 2023-02-05 VITALS — BP 109/60 | HR 75 | Temp 96.9°F | Resp 16 | Wt 141.4 lb

## 2023-02-05 DIAGNOSIS — D631 Anemia in chronic kidney disease: Secondary | ICD-10-CM

## 2023-02-05 DIAGNOSIS — E538 Deficiency of other specified B group vitamins: Secondary | ICD-10-CM | POA: Diagnosis not present

## 2023-02-05 DIAGNOSIS — D649 Anemia, unspecified: Secondary | ICD-10-CM

## 2023-02-05 DIAGNOSIS — N183 Chronic kidney disease, stage 3 unspecified: Secondary | ICD-10-CM | POA: Diagnosis not present

## 2023-02-05 LAB — IRON AND TIBC
Iron: 80 ug/dL (ref 28–170)
Saturation Ratios: 27 % (ref 10.4–31.8)
TIBC: 294 ug/dL (ref 250–450)
UIBC: 214 ug/dL

## 2023-02-05 LAB — BASIC METABOLIC PANEL - CANCER CENTER ONLY
Anion gap: 6 (ref 5–15)
BUN: 24 mg/dL — ABNORMAL HIGH (ref 8–23)
CO2: 24 mmol/L (ref 22–32)
Calcium: 8.6 mg/dL — ABNORMAL LOW (ref 8.9–10.3)
Chloride: 107 mmol/L (ref 98–111)
Creatinine: 1.31 mg/dL — ABNORMAL HIGH (ref 0.44–1.00)
GFR, Estimated: 39 mL/min — ABNORMAL LOW (ref >60)
Glucose, Bld: 163 mg/dL — ABNORMAL HIGH (ref 70–99)
Potassium: 4.4 mmol/L (ref 3.5–5.1)
Sodium: 137 mmol/L (ref 135–145)

## 2023-02-05 LAB — LACTATE DEHYDROGENASE: LDH: 177 U/L (ref 98–192)

## 2023-02-05 LAB — CBC WITH DIFFERENTIAL (CANCER CENTER ONLY)
Abs Immature Granulocytes: 0.01 10*3/uL (ref 0.00–0.07)
Basophils Absolute: 0 10*3/uL (ref 0.0–0.1)
Basophils Relative: 1 %
Eosinophils Absolute: 0.2 10*3/uL (ref 0.0–0.5)
Eosinophils Relative: 3 %
HCT: 30.3 % — ABNORMAL LOW (ref 36.0–46.0)
Hemoglobin: 9.7 g/dL — ABNORMAL LOW (ref 12.0–15.0)
Immature Granulocytes: 0 %
Lymphocytes Relative: 19 %
Lymphs Abs: 1.1 10*3/uL (ref 0.7–4.0)
MCH: 34.2 pg — ABNORMAL HIGH (ref 26.0–34.0)
MCHC: 32 g/dL (ref 30.0–36.0)
MCV: 106.7 fL — ABNORMAL HIGH (ref 80.0–100.0)
Monocytes Absolute: 0.3 10*3/uL (ref 0.1–1.0)
Monocytes Relative: 4 %
Neutro Abs: 4.1 10*3/uL (ref 1.7–7.7)
Neutrophils Relative %: 73 %
Platelet Count: 180 10*3/uL (ref 150–400)
RBC: 2.84 MIL/uL — ABNORMAL LOW (ref 3.87–5.11)
RDW: 13.8 % (ref 11.5–15.5)
WBC Count: 5.7 10*3/uL (ref 4.0–10.5)
nRBC: 0 % (ref 0.0–0.2)

## 2023-02-05 LAB — FERRITIN: Ferritin: 71 ng/mL (ref 11–307)

## 2023-02-05 LAB — VITAMIN B12: Vitamin B-12: 670 pg/mL (ref 180–914)

## 2023-02-05 MED ORDER — EPOETIN ALFA-EPBX 40000 UNIT/ML IJ SOLN
40000.0000 [IU] | Freq: Once | INTRAMUSCULAR | Status: AC
  Administered 2023-02-05: 40000 [IU] via SUBCUTANEOUS
  Filled 2023-02-05: qty 1

## 2023-02-05 MED ORDER — CYANOCOBALAMIN 1000 MCG/ML IJ SOLN
1000.0000 ug | Freq: Once | INTRAMUSCULAR | Status: AC
  Administered 2023-02-05: 1000 ug via INTRAMUSCULAR
  Filled 2023-02-05: qty 1

## 2023-02-05 MED FILL — Ferumoxytol Inj 510 MG/17ML (30 MG/ML) (Elemental Fe): INTRAVENOUS | Qty: 17 | Status: AC

## 2023-02-05 NOTE — Progress Notes (Signed)
Patient reports having black tarry stools for the past week.

## 2023-02-05 NOTE — Assessment & Plan Note (Addendum)
#  Severe anemia hemoglobin-nadir 6 [summer 2020]. Likely CKD/iron deficiency [July 2021- Bone marrow biopsy-mild dyserythropoietic changes]. [place] 2023- NEGATIVE for hemolysis [haptoglobin-WNL]  # Today hemoglobin is  9.7- HOLD Feraheme monthly.  Proceed with Retacrit today.- stable.  I sat-17; ferritin-81- NOV 2023.   # B12 def- FEB 2023- 232; continue B12 injections every monthly. [march 2023] ; B12 levels - 723 [NOV 2023] Proceed with injection today. Stable.    # CKD stage III- Recommend continued p.o. intake.  Stable.   # TIA: on plavix [Dr.Shah]- stable.   Monthly B12; Ferra/retacrit q m  # DISPOSITION:  #  HOLD Ferrahem today;  # proceed with RETACRIT;  B 12 injection # in 4 weeks-  MD-  labs- -cbc;bmp;  possible Ferrahem or possible retacrit; B12 injection- Dr.B

## 2023-02-05 NOTE — Addendum Note (Signed)
Addended by: Sofie Rower A on: 02/05/2023 04:21 PM   Modules accepted: Orders

## 2023-02-05 NOTE — Progress Notes (Signed)
[place] OFFICE PROGRESS NOTE  Patient Care Team: Einar Pheasant, MD as PCP - General (Internal Medicine) Einar Pheasant, MD (Internal Medicine) Isaias Cowman, MD (Internal Medicine) Brendolyn Patty, MD (Specialist) Schnier, Dolores Lory, MD (Vascular Surgery) Isaias Cowman, MD (Internal Medicine) Brendolyn Patty, MD (Specialist) Schnier, Dolores Lory, MD (Vascular Surgery) Cammie Sickle, MD as Consulting Physician (Oncology)   SUMMARY OF HEMATOLOGIC HISTORY:  # IRON DEFICIENCY ANEMIA- recurrent [? GI blood loss; Dr.Elliot; AVM-capsule]; IV Ferrahem q 31m; last colo- 2012/march; [id number].  July 2020 bone marrow biopsy-mild dysplastic changes; 40% hypercellularity; cytogenetics-normal.-Retacrit.MARCH 2023- [facility] Hem- TP53 subclonal. B12 def- FEB 2023- Start B12 injections Monthly; MAY 3rd, 2023- Start Retacrit 20K [place].   #Colonoscopy-April 2020 aborted because of cardiac pause.  # CKD stage III-IV; CAD-Dr. Saralyn Pilar;  March 30th, 2022-TIA/slurred spleech [Dr.Shah]  INTERVAL HISTORY: In a wheelchair.  With her daughter.   89 year old female patient with above history of recurrent iron deficiency anemia unclear etiology/?-related to CKD-on Feraheme/Retacrit is here for follow-up.  Patient reports having black tarry stools for the past week.  Stools are dark from iron. Takes oral iron every other day.   Denies dizziness or fatigue. No visible bleeding. Patient denies any nausea vomiting.   Review of Systems  Constitutional:  Positive for malaise/fatigue. Negative for chills, diaphoresis, fever and weight loss.  HENT:  Negative for nosebleeds and sore throat.   Eyes:  Negative for double vision.  Respiratory:  Negative for cough, hemoptysis, sputum production and wheezing.   Cardiovascular:  Negative for palpitations, orthopnea and leg swelling.  Gastrointestinal:  Negative for abdominal pain, blood in stool, constipation, diarrhea,  heartburn, melena, nausea and vomiting.  Genitourinary:  Negative for dysuria, frequency and urgency.  Musculoskeletal:  Negative for back pain and joint pain.  Skin: Negative.  Negative for itching and rash.  Neurological:  Negative for dizziness, tingling, focal weakness, weakness and headaches.  Endo/Heme/Allergies:  Does not bruise/bleed easily.  Psychiatric/Behavioral:  Negative for depression. The patient is not nervous/anxious and does not have insomnia.      PAST MEDICAL HISTORY :  Past Medical History:  Diagnosis Date   Arthritis    AV malformation of gastrointestinal tract    Blood in stool    CAD (coronary artery disease)    Carotid arterial disease (HCC)    Cataracts, bilateral    Chronic blood loss anemia    Chronic cystitis    CKD (chronic kidney disease), stage III (HCC)    Complication of anesthesia    reaction to propofol - caused heart to pause   Depression    Diabetes mellitus (HCC)    GERD (gastroesophageal reflux disease)    Hyperlipidemia    Hypertension    IDA (iron deficiency anemia)    Stress incontinence    TIA (transient ischemic attack) 03/2021   No deficits   Wears dentures    full upper and lower    PAST SURGICAL HISTORY :   Past Surgical History:  Procedure Laterality Date   ABDOMINAL HYSTERECTOMY  11/19/1970   ovaries left in place   APPENDECTOMY  11/19/1990   BACK SURGERY  06/08/2010   L3, L4, L5   CARDIAC CATHETERIZATION  02/2001   CAROTID ARTERY ANGIOPLASTY  09/19/2002   CARPAL TUNNEL RELEASE  1991, 92   right then left    CATARACT EXTRACTION W/PHACO Left 02/13/2022   Procedure: CATARACT EXTRACTION PHACO AND INTRAOCULAR LENS PLACEMENT (IOC) LEFT DIABETIC 14.90 01:26.8;  Surgeon: Birder Robson, MD;  Location: [facility];  Service: Ophthalmology;  Laterality: Left;  Diabetic   CATARACT EXTRACTION W/PHACO Right 03/06/2022   Procedure: CATARACT EXTRACTION PHACO AND INTRAOCULAR LENS PLACEMENT (IOC) RIGHT DIABETIC 9.66  00:53.7;  Surgeon: Birder Robson, MD;  Location: [place];  Service: Ophthalmology;  Laterality: Right;  Diabetic   CHOLECYSTECTOMY  11/20/1991   COLONOSCOPY WITH PROPOFOL N/A 02/02/2019   Procedure: COLONOSCOPY WITH PROPOFOL;  Surgeon: Manya Silvas, MD;  Location: [facility] ENDOSCOPY;  Service: Endoscopy;  Laterality: N/A;   ESOPHAGOGASTRODUODENOSCOPY N/A 02/02/2019   Procedure: ESOPHAGOGASTRODUODENOSCOPY (EGD);  Surgeon: Manya Silvas, MD;  Location[facility] ENDOSCOPY;  Service: Endoscopy;  Laterality: N/A;   FOOT SURGERY  06/20/2007   Left   right carotid artery surgery  01/09/2013   Dr. Delana Meyer @ [facility]   TOTAL HIP ARTHROPLASTY  05/03/2011   left 12, right 11/07    FAMILY HISTORY :   Family History  Problem Relation Age of Onset   Hyperlipidemia Father    Heart disease Father        myocardial infarction   Hypertension Father        Parent   Arthritis Other        parent   Diabetes Other        nephew   Cervical cancer Sister    Rectal cancer Sister    Breast cancer Neg Hx     SOCIAL HISTORY:   Social History   Tobacco Use   Smoking status: Former    Types: Cigarettes    Quit date: 11/19/1989    Years since quitting: 33.2   Smokeless tobacco: Never  Vaping Use   Vaping Use: Never used  Substance Use Topics   Alcohol use: No    Alcohol/week: 0.0 standard drinks of alcohol   Drug use: No    ALLERGIES:  is allergic to propofol, ciprofloxacin, levaquin [levofloxacin], and tequin [gatifloxacin].  MEDICATIONS:  Current Outpatient Medications  Medication Sig Dispense Refill   aspirin 81 MG tablet Take 81 mg by mouth daily.     Biotin 5000 MCG CAPS Take 1 capsule by mouth daily.     blood glucose meter kit and supplies KIT Dispense based on insurance preference. Use daily to check sugars once daily. Dx e11.9 1 each 0   cetirizine (ZYRTEC) 10 MG tablet Take 10 mg by mouth daily.     docusate sodium (COLACE) 100 MG capsule Take 100 mg by mouth daily as  needed for mild constipation.     ferrous sulfate 325 (65 FE) MG EC tablet Take 325 mg by mouth every other day. Every other day     finasteride (PROSCAR) 5 MG tablet TAKE 1 TABLET (5 MG TOTAL) BY MOUTH DAILY. 30 tablet 0   Lancets (ONETOUCH DELICA PLUS Q000111Q) MISC USE AS INSTRUCTED TO CHECK BLOOD SUGARS ONCE DAILY. DX E 11.9 100 each 12   Omega-3 Fatty Acids (FISH OIL) 1200 MG CAPS Take 1 capsule by mouth daily.     ONETOUCH ULTRA test strip USE AS INSTRUCTED TO CHECK BLOOD SUGARS ONCE DAILY. 100 strip 12   Probiotic, Lactobacillus, CAPS      rosuvastatin (CRESTOR) 40 MG tablet TAKE 1 TABLET BY MOUTH EVERY DAY 90 tablet 1   traZODone (DESYREL) 50 MG tablet TAKE 1/2 TO 1 TABLET BY MOUTH AT BEDTIME AS NEEDED FOR SLEEP 90 tablet 1   No current facility-administered medications for this visit.    PHYSICAL EXAMINATION:   BP 109/60 (BP Location: Left Arm,  Patient Position: Sitting)   Pulse 75   Temp (!) 96.9 F (36.1 C) (Tympanic)   Resp 16   Wt 141 lb 6.4 oz (64.1 kg)   BMI 28.56 kg/m   Filed Weights   02/05/23 1400  Weight: 141 lb 6.4 oz (64.1 kg)     Physical Exam Constitutional:      Comments: Frail-appearing Caucasian female patient.  She is in a wheelchair.  HENT:     Head: Normocephalic and atraumatic.     Mouth/Throat:     Pharynx: No oropharyngeal exudate.  Eyes:     Pupils: Pupils are equal, round, and reactive to light.     Comments: Positive for pallor.  Cardiovascular:     Rate and Rhythm: Normal rate and regular rhythm.  Pulmonary:     Effort: No respiratory distress.     Breath sounds: No wheezing.  Abdominal:     General: Bowel sounds are normal. There is no distension.     Palpations: Abdomen is soft. There is no mass.     Tenderness: There is no abdominal tenderness. There is no guarding or rebound.  Musculoskeletal:        General: No tenderness. Normal range of motion.     Cervical back: Normal range of motion and neck supple.  Skin:     General: Skin is warm.  Neurological:     Mental Status: She is alert and oriented to person, place, and time.  Psychiatric:        Mood and Affect: Affect normal.     LABORATORY DATA:  I have reviewed the data as listed    Component Value Date/Time   NA 137 02/05/2023 1429   NA 143 01/10/2013 0314   K 4.4 02/05/2023 1429   K 4.0 01/10/2013 0314   CL 107 02/05/2023 1429   CL 109 (H) 01/10/2013 0314   CO2 24 02/05/2023 1429   CO2 27 01/10/2013 0314   GLUCOSE 163 (H) 02/05/2023 1429   GLUCOSE 119 (H) 01/10/2013 0314   BUN 24 (H) 02/05/2023 1429   BUN 15 01/10/2013 0314   CREATININE 1.31 (H) 02/05/2023 1429   CREATININE 1.03 01/10/2013 0314   CREATININE 1.13 (H) 09/18/2012 0848   CALCIUM 8.6 (L) 02/05/2023 1429   CALCIUM 8.1 (L) 01/10/2013 0314   PROT 6.7 12/24/2022 1521   PROT 7.2 12/20/2011 1521   ALBUMIN 4.2 12/24/2022 1521   ALBUMIN 3.9 12/20/2011 1521   AST 36 12/24/2022 1521   AST 31 12/20/2011 1521   ALT 28 12/24/2022 1521   ALT 37 12/20/2011 1521   ALKPHOS 57 12/24/2022 1521   ALKPHOS 57 12/20/2011 1521   BILITOT 0.4 12/24/2022 1521   BILITOT 0.5 12/20/2011 1521   GFRNONAA 39 (L) 02/05/2023 1429   GFRNONAA 52 (L) 01/10/2013 0314   GFRNONAA 47 (L) 09/18/2012 0848   GFRAA 46 (L) 07/12/2020 1240   GFRAA >60 01/10/2013 0314   GFRAA 54 (L) 09/18/2012 0848    No results found for: "SPEP", "UPEP"  Lab Results  Component Value Date   WBC 5.7 02/05/2023   NEUTROABS 4.1 02/05/2023   HGB 9.7 (L) 02/05/2023   HCT 30.3 (L) 02/05/2023   MCV 106.7 (H) 02/05/2023   PLT 180 02/05/2023      Chemistry      Component Value Date/Time   NA 137 02/05/2023 1429   NA 143 01/10/2013 0314   K 4.4 02/05/2023 1429   K 4.0 01/10/2013 0314   CL 107 02/05/2023  1429   CL 109 (H) 01/10/2013 0314   CO2 24 02/05/2023 1429   CO2 27 01/10/2013 0314   BUN 24 (H) 02/05/2023 1429   BUN 15 01/10/2013 0314   CREATININE 1.31 (H) 02/05/2023 1429   CREATININE 1.03 01/10/2013 0314    CREATININE 1.13 (H) 09/18/2012 0848      Component Value Date/Time   CALCIUM 8.6 (L) 02/05/2023 1429   CALCIUM 8.1 (L) 01/10/2013 0314   ALKPHOS 57 12/24/2022 1521   ALKPHOS 57 12/20/2011 1521   AST 36 12/24/2022 1521   AST 31 12/20/2011 1521   ALT 28 12/24/2022 1521   ALT 37 12/20/2011 1521   BILITOT 0.4 12/24/2022 1521   BILITOT 0.5 12/20/2011 1521      ASSESSMENT & PLAN:   Anemia of chronic kidney failure, stage 3 (moderate) (HCC) #Severe anemia hemoglobin-nadir 6 [summer 2020]. Likely CKD/iron deficiency [July 2021- Bone marrow biopsy-mild dyserythropoietic changes]. [place] 2023- NEGATIVE for hemolysis [haptoglobin-WNL]  # Today hemoglobin is  9.7- HOLD Feraheme monthly.  Proceed with Retacrit today.- stable.  I sat-17; ferritin-81- NOV 2023.   # B12 def- FEB 2023- 232; continue B12 injections every monthly. [march 2023] ; B12 levels - 723 [NOV 2023] Proceed with injection today. Stable.    # CKD stage III- Recommend continued p.o. intake.  Stable.   # TIA: on plavix [Dr.Shah]- stable.   Monthly B12; Ferra/retacrit q m  # DISPOSITION:  #  HOLD Ferrahem today;  # proceed with RETACRIT;  B 12 injection # in 4 weeks-  MD-  labs- -cbc;bmp;  possible Ferrahem or possible retacrit; B12 injection- Dr.B   Cammie Sickle, MD 02/05/2023 4:14 PM

## 2023-02-21 ENCOUNTER — Ambulatory Visit (INDEPENDENT_AMBULATORY_CARE_PROVIDER_SITE_OTHER): Payer: Medicare Other | Admitting: Internal Medicine

## 2023-02-21 VITALS — Ht 59.0 in | Wt 141.0 lb

## 2023-02-21 DIAGNOSIS — R0981 Nasal congestion: Secondary | ICD-10-CM | POA: Diagnosis not present

## 2023-02-21 DIAGNOSIS — N183 Chronic kidney disease, stage 3 unspecified: Secondary | ICD-10-CM | POA: Diagnosis not present

## 2023-02-21 DIAGNOSIS — I10 Essential (primary) hypertension: Secondary | ICD-10-CM

## 2023-02-21 DIAGNOSIS — E1159 Type 2 diabetes mellitus with other circulatory complications: Secondary | ICD-10-CM | POA: Diagnosis not present

## 2023-02-21 MED ORDER — AZITHROMYCIN 250 MG PO TABS: ORAL_TABLET | ORAL | 0 refills | Status: AC

## 2023-02-21 NOTE — Progress Notes (Signed)
Patient ID: Jocelyn Elliott, female   DOB: 1932-02-22, 89 y.o.   MRN: [id number]   Virtual Visit via telephone Note   All issues noted in this document were discussed and addressed.  No physical exam was performed (except for noted visual exam findings with Video Visits).   I connected with Danella MaiersJeanette Buddenhagen by telephone and verified that I am speaking with the correct person using two identifiers. Location patient: home Location provider: work  Persons participating in the telephone visit: patient, provider  The limitations, risks, security and privacy concerns of performing an evaluation and management service by telephone and the availability of in person appointments have been discussed.  It has also been discussed with the patient that there may be a patient responsible charge related to this service. The patient expressed understanding and agreed to proceed.   Reason for visit: work in appt   HPI: Work in appt with concerns regarding increased congestion.  States symptoms started over the last two weeks.  Started taking mucinex.  Runny nose - hot water out of her nose.  Over the last three days - symptoms worsened.  Reports increased drainage.  No fever.  Some headache.  Tylenol relieves.  Nasal congestion - yellow mucus production.  Using [place]s cough drops.  Some increased drainage.  Feels starting to go into her chest.  No chest pain, chest tightness or sob.  Eating.  Staying hydrated.     ROS: See pertinent positives and negatives per HPI.  Past Medical History:  Diagnosis Date   Arthritis    AV malformation of gastrointestinal tract    Blood in stool    CAD (coronary artery disease)    Carotid arterial disease    Cataracts, bilateral    Chronic blood loss anemia    Chronic cystitis    CKD (chronic kidney disease), stage III    Complication of anesthesia    reaction to propofol - caused heart to pause   Depression    Diabetes mellitus    GERD (gastroesophageal reflux  disease)    Hyperlipidemia    Hypertension    IDA (iron deficiency anemia)    Stress incontinence    TIA (transient ischemic attack) 03/2021   No deficits   Wears dentures    full upper and lower    Past Surgical History:  Procedure Laterality Date   ABDOMINAL HYSTERECTOMY  11/19/1970   ovaries left in place   APPENDECTOMY  11/19/1990   BACK SURGERY  06/08/2010   L3, L4, L5   CARDIAC CATHETERIZATION  02/2001   CAROTID ARTERY ANGIOPLASTY  09/19/2002   CARPAL TUNNEL RELEASE  1991, 92   right then left    CATARACT EXTRACTION W/PHACO Left 02/13/2022   Procedure: CATARACT EXTRACTION PHACO AND INTRAOCULAR LENS PLACEMENT (IOC) LEFT DIABETIC 14.90 01:26.8;  Surgeon: Galen ManilaPorfilio, William, MD;  [facility];  Service: Ophthalmology;  Laterality: Left;  Diabetic   CATARACT EXTRACTION W/PHACO Right 03/06/2022   Procedure: CATARACT EXTRACTION PHACO AND INTRAOCULAR LENS PLACEMENT (IOC) RIGHT DIABETIC 9.66 00:53.7;  Surgeon: Galen ManilaPorfilio, William, MD;  [facility];  Service: Ophthalmology;  Laterality: Right;  Diabetic   CHOLECYSTECTOMY  11/20/1991   COLONOSCOPY WITH PROPOFOL N/A 02/02/2019   Procedure: COLONOSCOPY WITH PROPOFOL;  Surgeon: Scot JunElliott, Robert T, MD;  [facility];  Service: Endoscopy;  Laterality: N/A;   ESOPHAGOGASTRODUODENOSCOPY N/A 02/02/2019   Procedure: ESOPHAGOGASTRODUODENOSCOPY (EGD);  Surgeon: Scot JunElliott, Robert T, MD;  [facility];  Service: Endoscopy;  Laterality: N/A;  FOOT SURGERY  06/20/2007   Left   right carotid artery surgery  01/09/2013   Dr. Gilda Crease @ [facility]   TOTAL HIP ARTHROPLASTY  05/03/2011   left 12, right 11/07    Family History  Problem Relation Age of Onset   Hyperlipidemia Father    Heart disease Father        myocardial infarction   Hypertension Father        Parent   Arthritis Other        parent   Diabetes Other        nephew   Cervical cancer Sister    Rectal cancer Sister    Breast cancer  Neg Hx     SOCIAL HX: reviewed.    Current Outpatient Medications:    azithromycin (ZITHROMAX) 250 MG tablet, Take 2 tablets on day 1, then 1 tablet daily on days 2 through 5, Disp: 6 tablet, Rfl: 0   aspirin 81 MG tablet, Take 81 mg by mouth daily., Disp: , Rfl:    Biotin 5000 MCG CAPS, Take 1 capsule by mouth daily., Disp: , Rfl:    blood glucose meter kit and supplies KIT, Dispense based on insurance preference. Use daily to check sugars once daily. Dx e11.9, Disp: 1 each, Rfl: 0   cetirizine (ZYRTEC) 10 MG tablet, Take 10 mg by mouth daily., Disp: , Rfl:    docusate sodium (COLACE) 100 MG capsule, Take 100 mg by mouth daily as needed for mild constipation., Disp: , Rfl:    ferrous sulfate 325 (65 FE) MG EC tablet, Take 325 mg by mouth every other day. Every other day, Disp: , Rfl:    finasteride (PROSCAR) 5 MG tablet, TAKE 1 TABLET (5 MG TOTAL) BY MOUTH DAILY., Disp: 30 tablet, Rfl: 0   Lancets (ONETOUCH DELICA PLUS LANCET30G) MISC, USE AS INSTRUCTED TO CHECK BLOOD SUGARS ONCE DAILY. DX E 11.9, Disp: 100 each, Rfl: 12   Omega-3 Fatty Acids (FISH OIL) 1200 MG CAPS, Take 1 capsule by mouth daily., Disp: , Rfl:    ONETOUCH ULTRA test strip, USE AS INSTRUCTED TO CHECK BLOOD SUGARS ONCE DAILY., Disp: 100 strip, Rfl: 12   Probiotic, Lactobacillus, CAPS, , Disp: , Rfl:    rosuvastatin (CRESTOR) 40 MG tablet, TAKE 1 TABLET BY MOUTH EVERY DAY, Disp: 90 tablet, Rfl: 1   traZODone (DESYREL) 50 MG tablet, TAKE 1/2 TO 1 TABLET BY MOUTH AT BEDTIME AS NEEDED FOR SLEEP, Disp: 90 tablet, Rfl: 1  EXAM:  GENERAL: sounds to be in no acute distress.  Answering questions appropriately.   PSYCH/NEURO: pleasant and cooperative, no obvious depression or anxiety, speech and thought processing grossly intact  ASSESSMENT AND PLAN:  Discussed the following assessment and plan:  Problem List Items Addressed This Visit     Hypertension    Continues on imdur.  Follow pressures.        Diabetes mellitus     Sugars have been under reasonable control.  Follow.       Congestion of nasal sinus - Primary    Congestion as outlined.  Saline nasal spray and steroid nasal spray as outlined.  Continue mucinex as directed.  Zpak as directed.  Follow.  Call with update.       CKD (chronic kidney disease) stage 3, GFR 30-59 ml/min    Continue to avoid antiinflammatories.  Stay hydrated.  Follow metabolic panel. Off ace inhibitor due to low blood pressure.        Return if symptoms  worsen or fail to improve.   I discussed the assessment and treatment plan with the patient. The patient was provided an opportunity to ask questions and all were answered. The patient agreed with the plan and demonstrated an understanding of the instructions.   The patient was advised to call back or seek an in-person evaluation if the symptoms worsen or if the condition fails to improve as anticipated.  I provided 22 minutes of non-face-to-face time during this encounter.   Dale Durhamharlene Audreyanna Butkiewicz, MD

## 2023-02-24 ENCOUNTER — Encounter: Payer: Self-pay | Admitting: Internal Medicine

## 2023-02-25 ENCOUNTER — Telehealth: Payer: Self-pay

## 2023-02-25 NOTE — Assessment & Plan Note (Signed)
Continue to avoid antiinflammatories.  Stay hydrated.  Follow metabolic panel. Off ace inhibitor due to low blood pressure.  

## 2023-02-25 NOTE — Assessment & Plan Note (Signed)
Sugars have been under reasonable control.  Follow.  

## 2023-02-25 NOTE — Assessment & Plan Note (Signed)
Continues on imdur.  Follow pressures.   ?

## 2023-02-25 NOTE — Telephone Encounter (Signed)
Patient states Dr. Dale [place] gave her some medication for a sinus infection on 02/21/2023.  Patient states she is still coughing up yellow phlegm and she is still really congested in her sinuses.  Patient states she has one pill left, but thinks she will need to have more medication because she is not close to being well.  Patient states she is some better, but not near well.  Patient states she is supposed to go fill out her taxes at 2pm today and she would like to hear from [place] before then.

## 2023-02-25 NOTE — Telephone Encounter (Signed)
FYI-  Patient takes her last pill of abx today. She does feel like this is helping but she is still not completely better. Advised patient that she should complete her abx and give it a few more days since she does feel like she is improving and call us with update middle of this week.

## 2023-02-25 NOTE — Assessment & Plan Note (Signed)
Congestion as outlined.  Saline nasal spray and steroid nasal spray as outlined.  Continue mucinex as directed.  Zpak as directed.  Follow.  Call with update.

## 2023-02-26 NOTE — Telephone Encounter (Signed)
Per note, improving.  Will call with update.

## 2023-02-27 NOTE — Telephone Encounter (Signed)
Pt daughter called stating pt is not doing any better [phone]

## 2023-02-27 NOTE — Telephone Encounter (Signed)
FYI-  Called and spoke with daughter and patient. Per daughter- patient is not any better, weak, not wanting to do anything, etc. Per patient- she feels okay and does feel that the abx has helped some but still having a lot of sinus pressure and congestion. Some cough. Does not feel that she has gotten worse since phone visit. Advised to both of them that in office appt would be best to determine best treatment options. Pt declined and says that she does not want to come out. She did agree to do a virtual to discuss tomorrow PM. Confirmed no SOB, fever, body aches, etc. Also advised that if symptoms worsen or develops new symptoms- needs to be evaluated asap. Daughter agreed and said that she is going to be there with patient and has been getting her to drink fluids, eat, etc.

## 2023-02-28 ENCOUNTER — Telehealth: Payer: Medicare Other | Admitting: Internal Medicine

## 2023-02-28 ENCOUNTER — Other Ambulatory Visit: Payer: Self-pay

## 2023-02-28 ENCOUNTER — Emergency Department: Payer: Medicare Other

## 2023-02-28 ENCOUNTER — Encounter: Payer: Self-pay | Admitting: Internal Medicine

## 2023-02-28 ENCOUNTER — Inpatient Hospital Stay
Admission: EM | Admit: 2023-02-28 | Discharge: 2023-03-03 | DRG: 195 | Disposition: A | Discharge status: Home / Self Care | Payer: Medicare Other | Attending: Hospitalist | Admitting: Hospitalist

## 2023-02-28 VITALS — Ht 59.0 in | Wt 141.0 lb

## 2023-02-28 DIAGNOSIS — Z8673 Personal history of transient ischemic attack (TIA), and cerebral infarction without residual deficits: Secondary | ICD-10-CM

## 2023-02-28 DIAGNOSIS — Z87891 Personal history of nicotine dependence: Secondary | ICD-10-CM

## 2023-02-28 DIAGNOSIS — E119 Type 2 diabetes mellitus without complications: Secondary | ICD-10-CM

## 2023-02-28 DIAGNOSIS — Z9079 Acquired absence of other genital organ(s): Secondary | ICD-10-CM

## 2023-02-28 DIAGNOSIS — I2510 Atherosclerotic heart disease of native coronary artery without angina pectoris: Secondary | ICD-10-CM | POA: Diagnosis present

## 2023-02-28 DIAGNOSIS — M1990 Unspecified osteoarthritis, unspecified site: Secondary | ICD-10-CM | POA: Diagnosis present

## 2023-02-28 DIAGNOSIS — K219 Gastro-esophageal reflux disease without esophagitis: Secondary | ICD-10-CM | POA: Diagnosis present

## 2023-02-28 DIAGNOSIS — Z961 Presence of intraocular lens: Secondary | ICD-10-CM | POA: Diagnosis present

## 2023-02-28 DIAGNOSIS — I4891 Unspecified atrial fibrillation: Secondary | ICD-10-CM

## 2023-02-28 DIAGNOSIS — J069 Acute upper respiratory infection, unspecified: Secondary | ICD-10-CM | POA: Diagnosis present

## 2023-02-28 DIAGNOSIS — Z833 Family history of diabetes mellitus: Secondary | ICD-10-CM

## 2023-02-28 DIAGNOSIS — J159 Unspecified bacterial pneumonia: Principal | ICD-10-CM

## 2023-02-28 DIAGNOSIS — N183 Chronic kidney disease, stage 3 unspecified: Secondary | ICD-10-CM | POA: Diagnosis not present

## 2023-02-28 DIAGNOSIS — I700 Atherosclerosis of aorta: Secondary | ICD-10-CM

## 2023-02-28 DIAGNOSIS — B9789 Other viral agents as the cause of diseases classified elsewhere: Secondary | ICD-10-CM | POA: Diagnosis present

## 2023-02-28 DIAGNOSIS — Z83438 Family history of other disorder of lipoprotein metabolism and other lipidemia: Secondary | ICD-10-CM

## 2023-02-28 DIAGNOSIS — J189 Pneumonia, unspecified organism: Secondary | ICD-10-CM | POA: Diagnosis not present

## 2023-02-28 DIAGNOSIS — Z9049 Acquired absence of other specified parts of digestive tract: Secondary | ICD-10-CM

## 2023-02-28 DIAGNOSIS — Z8249 Family history of ischemic heart disease and other diseases of the circulatory system: Secondary | ICD-10-CM

## 2023-02-28 DIAGNOSIS — Z1152 Encounter for screening for COVID-19: Secondary | ICD-10-CM

## 2023-02-28 DIAGNOSIS — E785 Hyperlipidemia, unspecified: Secondary | ICD-10-CM | POA: Diagnosis present

## 2023-02-28 DIAGNOSIS — A419 Sepsis, unspecified organism: Secondary | ICD-10-CM | POA: Diagnosis not present

## 2023-02-28 DIAGNOSIS — Z79899 Other long term (current) drug therapy: Secondary | ICD-10-CM

## 2023-02-28 DIAGNOSIS — N1832 Chronic kidney disease, stage 3b: Secondary | ICD-10-CM | POA: Diagnosis present

## 2023-02-28 DIAGNOSIS — R0602 Shortness of breath: Secondary | ICD-10-CM

## 2023-02-28 DIAGNOSIS — F32A Depression, unspecified: Secondary | ICD-10-CM | POA: Diagnosis present

## 2023-02-28 DIAGNOSIS — R0603 Acute respiratory distress: Secondary | ICD-10-CM | POA: Diagnosis present

## 2023-02-28 DIAGNOSIS — Z96643 Presence of artificial hip joint, bilateral: Secondary | ICD-10-CM | POA: Diagnosis present

## 2023-02-28 DIAGNOSIS — Z90710 Acquired absence of both cervix and uterus: Secondary | ICD-10-CM

## 2023-02-28 DIAGNOSIS — D509 Iron deficiency anemia, unspecified: Secondary | ICD-10-CM | POA: Diagnosis present

## 2023-02-28 DIAGNOSIS — I129 Hypertensive chronic kidney disease with stage 1 through stage 4 chronic kidney disease, or unspecified chronic kidney disease: Secondary | ICD-10-CM | POA: Diagnosis present

## 2023-02-28 DIAGNOSIS — Z7982 Long term (current) use of aspirin: Secondary | ICD-10-CM

## 2023-02-28 DIAGNOSIS — I10 Essential (primary) hypertension: Secondary | ICD-10-CM | POA: Diagnosis present

## 2023-02-28 DIAGNOSIS — N3020 Other chronic cystitis without hematuria: Secondary | ICD-10-CM | POA: Diagnosis present

## 2023-02-28 DIAGNOSIS — Z881 Allergy status to other antibiotic agents status: Secondary | ICD-10-CM

## 2023-02-28 DIAGNOSIS — I25119 Atherosclerotic heart disease of native coronary artery with unspecified angina pectoris: Secondary | ICD-10-CM

## 2023-02-28 DIAGNOSIS — Z888 Allergy status to other drugs, medicaments and biological substances status: Secondary | ICD-10-CM

## 2023-02-28 DIAGNOSIS — E1122 Type 2 diabetes mellitus with diabetic chronic kidney disease: Secondary | ICD-10-CM | POA: Diagnosis present

## 2023-02-28 LAB — CBC WITH DIFFERENTIAL/PLATELET
Abs Immature Granulocytes: 0.04 10*3/uL (ref 0.00–0.07)
Basophils Absolute: 0 10*3/uL (ref 0.0–0.1)
Basophils Relative: 0 %
Eosinophils Absolute: 0 10*3/uL (ref 0.0–0.5)
Eosinophils Relative: 0 %
HCT: 30.8 % — ABNORMAL LOW (ref 36.0–46.0)
Hemoglobin: 9.9 g/dL — ABNORMAL LOW (ref 12.0–15.0)
Immature Granulocytes: 0 %
Lymphocytes Relative: 8 %
Lymphs Abs: 1 10*3/uL (ref 0.7–4.0)
MCH: 32.7 pg (ref 26.0–34.0)
MCHC: 32.1 g/dL (ref 30.0–36.0)
MCV: 101.7 fL — ABNORMAL HIGH (ref 80.0–100.0)
Monocytes Absolute: 0.7 10*3/uL (ref 0.1–1.0)
Monocytes Relative: 6 %
Neutro Abs: 10 10*3/uL — ABNORMAL HIGH (ref 1.7–7.7)
Neutrophils Relative %: 86 %
Platelets: 232 10*3/uL (ref 150–400)
RBC: 3.03 MIL/uL — ABNORMAL LOW (ref 3.87–5.11)
RDW: 13.7 % (ref 11.5–15.5)
WBC: 11.8 10*3/uL — ABNORMAL HIGH (ref 4.0–10.5)
nRBC: 0 % (ref 0.0–0.2)

## 2023-02-28 LAB — CBC
HCT: 33.3 % — ABNORMAL LOW (ref 36.0–46.0)
Hemoglobin: 10.7 g/dL — ABNORMAL LOW (ref 12.0–15.0)
MCH: 32.4 pg (ref 26.0–34.0)
MCHC: 32.1 g/dL (ref 30.0–36.0)
MCV: 100.9 fL — ABNORMAL HIGH (ref 80.0–100.0)
Platelets: 238 10*3/uL (ref 150–400)
RBC: 3.3 MIL/uL — ABNORMAL LOW (ref 3.87–5.11)
RDW: 13.5 % (ref 11.5–15.5)
WBC: 10.2 10*3/uL (ref 4.0–10.5)
nRBC: 0 % (ref 0.0–0.2)

## 2023-02-28 LAB — BASIC METABOLIC PANEL
Anion gap: 9 (ref 5–15)
BUN: 21 mg/dL (ref 8–23)
CO2: 21 mmol/L — ABNORMAL LOW (ref 22–32)
Calcium: 8.4 mg/dL — ABNORMAL LOW (ref 8.9–10.3)
Chloride: 99 mmol/L (ref 98–111)
Creatinine, Ser: 1.21 mg/dL — ABNORMAL HIGH (ref 0.44–1.00)
GFR, Estimated: 43 mL/min — ABNORMAL LOW (ref >60)
Glucose, Bld: 137 mg/dL — ABNORMAL HIGH (ref 70–99)
Potassium: 3.5 mmol/L (ref 3.5–5.1)
Sodium: 129 mmol/L — ABNORMAL LOW (ref 135–145)

## 2023-02-28 LAB — APTT: aPTT: 22 seconds — ABNORMAL LOW (ref 24–36)

## 2023-02-28 LAB — LACTIC ACID, PLASMA: Lactic Acid, Venous: 1.3 mmol/L (ref 0.5–1.9)

## 2023-02-28 LAB — PROTIME-INR
INR: 1.2 (ref 0.8–1.2)
Prothrombin Time: 14.9 seconds (ref 11.4–15.2)

## 2023-02-28 MED ORDER — SODIUM CHLORIDE 0.9 % IV SOLN
2.0000 g | INTRAVENOUS | Status: DC
  Administered 2023-02-28: 2 g via INTRAVENOUS
  Filled 2023-02-28: qty 20

## 2023-02-28 MED ORDER — SODIUM CHLORIDE 0.9 % IV SOLN
500.0000 mg | INTRAVENOUS | Status: DC
  Administered 2023-03-01: 500 mg via INTRAVENOUS
  Filled 2023-02-28: qty 5

## 2023-02-28 MED ORDER — LACTATED RINGERS IV BOLUS (SEPSIS)
1000.0000 mL | Freq: Once | INTRAVENOUS | Status: AC
  Administered 2023-02-28: 1000 mL via INTRAVENOUS

## 2023-02-28 NOTE — Sepsis Progress Note (Signed)
Sepsis protocol is being followed by [facility]. 

## 2023-02-28 NOTE — Assessment & Plan Note (Signed)
Continue crestor 

## 2023-02-28 NOTE — ED Provider Notes (Signed)
[facility] Provider Note    Event Date/Time   First MD Initiated Contact with Patient 02/28/23 2235     (approximate)   History   Shortness of Breath   HPI  Jocelyn Elliott is a 89 y.o. female  here with cough and sob. Pt has had 1 week of sinusitis sx, on azithromycin. States she has had progressively worsening wheezing and SOB with this and has now had increased sputum production. She has felt generally weak. She has had poor appetite. No chest pain. No rash. No h/o COPD.       Physical Exam   Triage Vital Signs: ED Triage Vitals  Enc Vitals Group     BP 02/28/23 1819 (!) 105/55     Pulse Rate 02/28/23 1816 (!) 104     Resp 02/28/23 1819 20     Temp 02/28/23 1816 99.4 F (37.4 C)     Temp src --      SpO2 02/28/23 1819 93 %     Weight --      Height --      Head Circumference --      Peak Flow --      Pain Score 02/28/23 1817 0     Pain Loc --      Pain Edu? --      Excl. in GC? --     Most recent vital signs: Vitals:   03/01/23 0020 03/01/23 0020  BP: (!) 138/127   Pulse: 87   Resp: 20   Temp:  98.7 F (37.1 C)  SpO2:       General: Awake, no distress.  CV:  Good peripheral perfusion. RRR. Resp:  Normal work of breathing. Bilateral rhonchi with mild expiratory wheezes. Abd:  No distention. No tenderness. NO rebound or guarding. Other:  No LE edema.   ED Results / Procedures / Treatments   Labs (all labs ordered are listed, but only abnormal results are displayed) Labs Reviewed  CBC - Abnormal; Notable for the following components:      Result Value   RBC 3.30 (*)    Hemoglobin 10.7 (*)    HCT 33.3 (*)    MCV 100.9 (*)    All other components within normal limits  BASIC METABOLIC PANEL - Abnormal; Notable for the following components:   Sodium 129 (*)    CO2 21 (*)    Glucose, Bld 137 (*)    Creatinine, Ser 1.21 (*)    Calcium 8.4 (*)    GFR, Estimated 43 (*)    All other components within normal limits   CBC WITH DIFFERENTIAL/PLATELET - Abnormal; Notable for the following components:   WBC 11.8 (*)    RBC 3.03 (*)    Hemoglobin 9.9 (*)    HCT 30.8 (*)    MCV 101.7 (*)    Neutro Abs 10.0 (*)    All other components within normal limits  APTT - Abnormal; Notable for the following components:   aPTT 22 (*)    All other components within normal limits  URINALYSIS, COMPLETE (UACMP) WITH MICROSCOPIC - Abnormal; Notable for the following components:   Color, Urine YELLOW (*)    APPearance HAZY (*)    Hgb urine dipstick LARGE (*)    Protein, ur >=300 (*)    Leukocytes,Ua TRACE (*)    Bacteria, UA RARE (*)    All other components within normal limits  TROPONIN I (HIGH SENSITIVITY) - Abnormal; Notable for the  following components:   Troponin I (High Sensitivity) 59 (*)    All other components within normal limits  RESP PANEL BY RT-PCR (RSV, FLU A&B, COVID)  RVPGX2  CULTURE, BLOOD (ROUTINE X 2)  CULTURE, BLOOD (ROUTINE X 2)  LACTIC ACID, PLASMA  PROTIME-INR  LACTIC ACID, PLASMA  CORTISOL-AM, BLOOD  PROCALCITONIN  TROPONIN I (HIGH SENSITIVITY)     EKG Normal sinus rhythm, VR 97. PR 132, QRS 100, QTc 457. No acute ST elevations or depressions. No ischemia or infarct. Nonspecific ST changes.   RADIOLOGY CXR: R basilar opacities, suspicious for PNA   I also independently reviewed and agree with radiologist interpretations.   PROCEDURES:  Critical Care performed: No  .1-3 Lead EKG Interpretation  Performed by: Shaune PollackIsaacs, Livana Yerian, MD Authorized by: Shaune PollackIsaacs, Saryn Cherry, MD     Interpretation: normal     ECG rate:  80-100   ECG rate assessment: normal     Rhythm: sinus rhythm     Ectopy: none     Conduction: normal   Comments:     Indication: sob     MEDICATIONS ORDERED IN ED: Medications  aspirin EC tablet 81 mg (has no administration in time range)  rosuvastatin (CRESTOR) tablet 40 mg (has no administration in time range)  traZODone (DESYREL) tablet 50 mg (has no  administration in time range)  docusate sodium (COLACE) capsule 100 mg (has no administration in time range)  finasteride (PROSCAR) tablet 5 mg (has no administration in time range)  ferrous sulfate tablet 325 mg (has no administration in time range)  lactated ringers infusion (has no administration in time range)  enoxaparin (LOVENOX) injection 30 mg (has no administration in time range)  cefTRIAXone (ROCEPHIN) 2 g in sodium chloride 0.9 % 100 mL IVPB (has no administration in time range)  azithromycin (ZITHROMAX) 500 mg in sodium chloride 0.9 % 250 mL IVPB (has no administration in time range)  acetaminophen (TYLENOL) tablet 650 mg (has no administration in time range)    Or  acetaminophen (TYLENOL) suppository 650 mg (has no administration in time range)  ondansetron (ZOFRAN) tablet 4 mg (has no administration in time range)    Or  ondansetron (ZOFRAN) injection 4 mg (has no administration in time range)  albuterol (PROVENTIL) (2.5 MG/3ML) 0.083% nebulizer solution 2.5 mg (has no administration in time range)  guaiFENesin-dextromethorphan (ROBITUSSIN DM) 100-10 MG/5ML syrup 5 mL (has no administration in time range)  insulin aspart (novoLOG) injection 0-15 Units (has no administration in time range)  insulin aspart (novoLOG) injection 0-5 Units (has no administration in time range)  lactated ringers bolus 1,000 mL (1,000 mLs Intravenous New Bag/Given 02/28/23 2344)    And  lactated ringers bolus 1,000 mL (1,000 mLs Intravenous New Bag/Given 02/28/23 2358)     IMPRESSION / MDM / ASSESSMENT AND PLAN / ED COURSE  I reviewed the triage vital signs and the nursing notes.                              Differential diagnosis includes, but is not limited to, CAP, CHF, ACS, anemia, deconditioning, PTX  Patient's presentation is most consistent with acute presentation with potential threat to life or bodily function.  The patient is on the cardiac monitor to evaluate for evidence of arrhythmia  and/or significant heart rate changes  89 yo F here with cough, congestion in setting of recent sinusitis. CXR shows R basilar PNA. BMP with mild hyponatremia. CBC with mild  leukocytosis. Given age and comorbidities, will admit to medicine. Pt and family in agreement.     FINAL CLINICAL IMPRESSION(S) / ED DIAGNOSES   Final diagnoses:  Community acquired bacterial pneumonia     Rx / DC Orders   ED Discharge Orders     None        Note:  This document was prepared using Dragon voice recognition software and may include unintentional dictation errors.   Shaune Pollack, MD 03/01/23 [id number]

## 2023-02-28 NOTE — Assessment & Plan Note (Signed)
Off imdur due to low blood pressure.  Remains on crestor.  Currently without symptoms.

## 2023-02-28 NOTE — Assessment & Plan Note (Signed)
Increased sob.  Having to sleep in lift chair - upright.  Increased cough and congestion.  Wheezing.  Recently treated with zpak.  Worsening symptoms.  Given worsening symptoms and increased fatigue and sob, discussed the need for ER evaluation.  Daughter agreeable to take her to the ER.  ER notified.

## 2023-02-28 NOTE — ED Notes (Signed)
2nd EKG taken at this time per Dr. Robbi Garter order.

## 2023-02-28 NOTE — ED Triage Notes (Signed)
Pt to ED [facility] from home for congestion x5 weeks. Shob with exertion.

## 2023-02-28 NOTE — Progress Notes (Signed)
Patient ID: Jocelyn Elliott, female   DOB: 21-Jan-1932, 89 y.o.   MRN: [id number]   Virtual Visit via video Note   All issues noted in this document were discussed and addressed.  No physical exam was performed (except for noted visual exam findings with Video Visits).   I connected with Jocelyn Elliott by a video enabled telemedicine application and verified that I am speaking with the correct person using two identifiers. Location patient: home Location provider: work Persons participating in the virtual visit: patient, provider  The limitations, risks, security and privacy concerns of performing an evaluation and management service by video and the availability of in person appointments have been discussed. It has also been discussed with the patient that there may be a patient responsible charge related to this service. The patient expressed understanding and agreed to proceed.    Reason for visit: work in appt  HPI: Work in for persistent congestion and cough.  She is accompanied by her daughter Jocelyn Elliott).  History obtained from both of them.  She was seen initially on 02/21/23 with nasal congestion.  Was placed on zpak at that time.  Worked in today - worsening symptoms.  Increased nasal congestion and chest congestion.  Is having to sleep upright in her lift chair.  No fever.  Yellow mucus production.  No sore throat.  No vomiting or diarrhea.  Increased sob.  Sleeping a lot.  Having trouble breathing.     ROS: See pertinent positives and negatives per HPI.  Past Medical History:  Diagnosis Date   Arthritis    AV malformation of gastrointestinal tract    Blood in stool    CAD (coronary artery disease)    Carotid arterial disease    Cataracts, bilateral    Chronic blood loss anemia    Chronic cystitis    CKD (chronic kidney disease), stage III    Complication of anesthesia    reaction to propofol - caused heart to pause   Depression    Diabetes mellitus    GERD (gastroesophageal  reflux disease)    Hyperlipidemia    Hypertension    IDA (iron deficiency anemia)    Stress incontinence    TIA (transient ischemic attack) 03/2021   No deficits   Wears dentures    full upper and lower    Past Surgical History:  Procedure Laterality Date   ABDOMINAL HYSTERECTOMY  11/19/1970   ovaries left in place   APPENDECTOMY  11/19/1990   BACK SURGERY  06/08/2010   L3, L4, L5   CARDIAC CATHETERIZATION  02/2001   CAROTID ARTERY ANGIOPLASTY  09/19/2002   CARPAL TUNNEL RELEASE  1991, 92   right then left    CATARACT EXTRACTION W/PHACO Left 02/13/2022   Procedure: CATARACT EXTRACTION PHACO AND INTRAOCULAR LENS PLACEMENT (IOC) LEFT DIABETIC 14.90 01:26.8;  Surgeon: Galen Manila, MD;  Location: [facility];  Service: Ophthalmology;  Laterality: Left;  Diabetic   CATARACT EXTRACTION W/PHACO Right 03/06/2022   Procedure: CATARACT EXTRACTION PHACO AND INTRAOCULAR LENS PLACEMENT (IOC) RIGHT DIABETIC 9.66 00:53.7;  Surgeon: Galen Manila, MD;  Location: [facility];  Service: Ophthalmology;  Laterality: Right;  Diabetic   CHOLECYSTECTOMY  11/20/1991   COLONOSCOPY WITH PROPOFOL N/A 02/02/2019   Procedure: COLONOSCOPY WITH PROPOFOL;  Surgeon: Scot Jun, MD;  Location: [facility];  Service: Endoscopy;  Laterality: N/A;   ESOPHAGOGASTRODUODENOSCOPY N/A 02/02/2019   Procedure: ESOPHAGOGASTRODUODENOSCOPY (EGD);  Surgeon: Scot Jun, MD;  Location: [facility] END[facility];  Service: Endoscopy;  Laterality: N/A;   FOOT SURGERY  06/20/2007   Left   right carotid artery surgery  01/09/2013   Dr. Gilda CreaseSchnier @ [facility]   TOTAL HIP ARTHROPLASTY  05/03/2011   left 12, right 11/07    Family History  Problem Relation Age of Onset   Hyperlipidemia Father    Heart disease Father        myocardial infarction   Hypertension Father        Parent   Arthritis Other        parent   Diabetes Other        nephew   Cervical cancer Sister    Rectal cancer Sister    Breast  cancer Neg Hx     SOCIAL HX: reviewed.    Current Outpatient Medications:    aspirin 81 MG tablet, Take 81 mg by mouth daily., Disp: , Rfl:    Biotin 5000 MCG CAPS, Take 1 capsule by mouth daily., Disp: , Rfl:    blood glucose meter kit and supplies KIT, Dispense based on insurance preference. Use daily to check sugars once daily. Dx e11.9, Disp: 1 each, Rfl: 0   cetirizine (ZYRTEC) 10 MG tablet, Take 10 mg by mouth daily., Disp: , Rfl:    docusate sodium (COLACE) 100 MG capsule, Take 100 mg by mouth daily as needed for mild constipation., Disp: , Rfl:    ferrous sulfate 325 (65 FE) MG EC tablet, Take 325 mg by mouth every other day. Every other day, Disp: , Rfl:    finasteride (PROSCAR) 5 MG tablet, TAKE 1 TABLET (5 MG TOTAL) BY MOUTH DAILY., Disp: 30 tablet, Rfl: 0   Lancets (ONETOUCH DELICA PLUS LANCET30G) MISC, USE AS INSTRUCTED TO CHECK BLOOD SUGARS ONCE DAILY. DX E 11.9, Disp: 100 each, Rfl: 12   Omega-3 Fatty Acids (FISH OIL) 1200 MG CAPS, Take 1 capsule by mouth daily., Disp: , Rfl:    ONETOUCH ULTRA test strip, USE AS INSTRUCTED TO CHECK BLOOD SUGARS ONCE DAILY., Disp: 100 strip, Rfl: 12   Probiotic, Lactobacillus, CAPS, , Disp: , Rfl:    rosuvastatin (CRESTOR) 40 MG tablet, TAKE 1 TABLET BY MOUTH EVERY DAY, Disp: 90 tablet, Rfl: 1   traZODone (DESYREL) 50 MG tablet, TAKE 1/2 TO 1 TABLET BY MOUTH AT BEDTIME AS NEEDED FOR SLEEP, Disp: 90 tablet, Rfl: 1  EXAM:  VITALS per patient if applicable: pulse 89, 94%  GENERAL: alert, oriented, appears not to feel well.   HEENT: atraumatic, conjunttiva clear, no obvious abnormalities on inspection of external nose and ears  NECK: normal movements of the head and neck  LUNGS: increased difficulty breathing.  Increased cough with forced expiration.   CV: no obvious cyanosis  PSYCH/NEURO: pleasant and cooperative, no obvious depression or anxiety, speech and thought processing grossly intact  ASSESSMENT AND PLAN:  Discussed the  following assessment and plan:  Problem List Items Addressed This Visit     SOB (shortness of breath) - Primary    Increased sob.  Having to sleep in lift chair - upright.  Increased cough and congestion.  Wheezing.  Recently treated with zpak.  Worsening symptoms.  Given worsening symptoms and increased fatigue and sob, discussed the need for ER evaluation.  Daughter agreeable to take her to the ER.  ER notified.        Coronary artery disease with angina pectoris    Off imdur due to low blood pressure.  Remains on crestor.  Currently without symptoms.  CKD (chronic kidney disease) stage 3, GFR 30-59 ml/min    Continue to avoid antiinflammatories.  Stay hydrated.  Follow metabolic panel. Off ace inhibitor due to low blood pressure.       Aortic atherosclerosis    Continue crestor.         Afib    Continues on aspirin.  Given increased sob and congestion - to ER for evaluation.         Return if symptoms worsen or fail to improve.   I discussed the assessment and treatment plan with the patient. The patient was provided an opportunity to ask questions and all were answered. The patient agreed with the plan and demonstrated an understanding of the instructions.   The patient was advised to call back or seek an in-person evaluation if the symptoms worsen or if the condition fails to improve as anticipated.    Dale Butler, MD

## 2023-02-28 NOTE — Assessment & Plan Note (Signed)
Continue to avoid antiinflammatories.  Stay hydrated.  Follow metabolic panel. Off ace inhibitor due to low blood pressure.  

## 2023-02-28 NOTE — Assessment & Plan Note (Signed)
Continues on aspirin.  Given increased sob and congestion - to ER for evaluation.

## 2023-03-01 ENCOUNTER — Telehealth: Payer: Self-pay | Admitting: Internal Medicine

## 2023-03-01 ENCOUNTER — Encounter: Payer: Self-pay | Admitting: Internal Medicine

## 2023-03-01 DIAGNOSIS — N3020 Other chronic cystitis without hematuria: Secondary | ICD-10-CM | POA: Diagnosis present

## 2023-03-01 DIAGNOSIS — I129 Hypertensive chronic kidney disease with stage 1 through stage 4 chronic kidney disease, or unspecified chronic kidney disease: Secondary | ICD-10-CM | POA: Diagnosis present

## 2023-03-01 DIAGNOSIS — N1832 Chronic kidney disease, stage 3b: Secondary | ICD-10-CM | POA: Diagnosis present

## 2023-03-01 DIAGNOSIS — Z8249 Family history of ischemic heart disease and other diseases of the circulatory system: Secondary | ICD-10-CM | POA: Diagnosis not present

## 2023-03-01 DIAGNOSIS — Z8673 Personal history of transient ischemic attack (TIA), and cerebral infarction without residual deficits: Secondary | ICD-10-CM | POA: Diagnosis not present

## 2023-03-01 DIAGNOSIS — Z1152 Encounter for screening for COVID-19: Secondary | ICD-10-CM | POA: Diagnosis not present

## 2023-03-01 DIAGNOSIS — F32A Depression, unspecified: Secondary | ICD-10-CM | POA: Diagnosis present

## 2023-03-01 DIAGNOSIS — Z90710 Acquired absence of both cervix and uterus: Secondary | ICD-10-CM | POA: Diagnosis not present

## 2023-03-01 DIAGNOSIS — J159 Unspecified bacterial pneumonia: Secondary | ICD-10-CM

## 2023-03-01 DIAGNOSIS — E1122 Type 2 diabetes mellitus with diabetic chronic kidney disease: Secondary | ICD-10-CM | POA: Diagnosis present

## 2023-03-01 DIAGNOSIS — K219 Gastro-esophageal reflux disease without esophagitis: Secondary | ICD-10-CM | POA: Diagnosis present

## 2023-03-01 DIAGNOSIS — B9789 Other viral agents as the cause of diseases classified elsewhere: Secondary | ICD-10-CM | POA: Diagnosis present

## 2023-03-01 DIAGNOSIS — J189 Pneumonia, unspecified organism: Secondary | ICD-10-CM

## 2023-03-01 DIAGNOSIS — Z961 Presence of intraocular lens: Secondary | ICD-10-CM | POA: Diagnosis present

## 2023-03-01 DIAGNOSIS — Z87891 Personal history of nicotine dependence: Secondary | ICD-10-CM | POA: Diagnosis not present

## 2023-03-01 DIAGNOSIS — D509 Iron deficiency anemia, unspecified: Secondary | ICD-10-CM | POA: Diagnosis present

## 2023-03-01 DIAGNOSIS — Z96643 Presence of artificial hip joint, bilateral: Secondary | ICD-10-CM | POA: Diagnosis present

## 2023-03-01 DIAGNOSIS — E785 Hyperlipidemia, unspecified: Secondary | ICD-10-CM | POA: Diagnosis present

## 2023-03-01 DIAGNOSIS — I2510 Atherosclerotic heart disease of native coronary artery without angina pectoris: Secondary | ICD-10-CM | POA: Diagnosis present

## 2023-03-01 DIAGNOSIS — M1990 Unspecified osteoarthritis, unspecified site: Secondary | ICD-10-CM | POA: Diagnosis present

## 2023-03-01 DIAGNOSIS — A419 Sepsis, unspecified organism: Secondary | ICD-10-CM | POA: Insufficient documentation

## 2023-03-01 DIAGNOSIS — B348 Other viral infections of unspecified site: Secondary | ICD-10-CM | POA: Diagnosis not present

## 2023-03-01 DIAGNOSIS — Z9079 Acquired absence of other genital organ(s): Secondary | ICD-10-CM | POA: Diagnosis not present

## 2023-03-01 DIAGNOSIS — R0603 Acute respiratory distress: Secondary | ICD-10-CM | POA: Diagnosis present

## 2023-03-01 DIAGNOSIS — J069 Acute upper respiratory infection, unspecified: Secondary | ICD-10-CM | POA: Diagnosis present

## 2023-03-01 DIAGNOSIS — Z881 Allergy status to other antibiotic agents status: Secondary | ICD-10-CM | POA: Diagnosis not present

## 2023-03-01 DIAGNOSIS — Z9049 Acquired absence of other specified parts of digestive tract: Secondary | ICD-10-CM | POA: Diagnosis not present

## 2023-03-01 LAB — RESPIRATORY PANEL BY PCR

## 2023-03-01 LAB — URINALYSIS, COMPLETE (UACMP) WITH MICROSCOPIC
Bilirubin Urine: NEGATIVE
Glucose, UA: NEGATIVE mg/dL
Ketones, ur: NEGATIVE mg/dL
Nitrite: NEGATIVE
Protein, ur: >=300 mg/dL — AB
Specific Gravity, Urine: 1.013 (ref 1.005–1.030)
pH: 6 (ref 5.0–8.0)

## 2023-03-01 LAB — TROPONIN I (HIGH SENSITIVITY)
Troponin I (High Sensitivity): 59 ng/L — ABNORMAL HIGH (ref <18)
Troponin I (High Sensitivity): 57 ng/L — ABNORMAL HIGH (ref <18)

## 2023-03-01 LAB — LACTIC ACID, PLASMA
Lactic Acid, Venous: 2.8 mmol/L (ref 0.5–1.9)
Lactic Acid, Venous: 1.6 mmol/L (ref 0.5–1.9)
Lactic Acid, Venous: 2.1 mmol/L (ref 0.5–1.9)

## 2023-03-01 LAB — RESP PANEL BY RT-PCR (RSV, FLU A&B, COVID)  RVPGX2
Influenza A by PCR: NEGATIVE
Influenza B by PCR: NEGATIVE
Resp Syncytial Virus by PCR: NEGATIVE
SARS Coronavirus 2 by RT PCR: NEGATIVE

## 2023-03-01 LAB — GLUCOSE, CAPILLARY
Glucose-Capillary: 120 mg/dL — ABNORMAL HIGH (ref 70–99)
Glucose-Capillary: 86 mg/dL (ref 70–99)
Glucose-Capillary: 135 mg/dL — ABNORMAL HIGH (ref 70–99)
Glucose-Capillary: 104 mg/dL — ABNORMAL HIGH (ref 70–99)
Glucose-Capillary: 136 mg/dL — ABNORMAL HIGH (ref 70–99)

## 2023-03-01 LAB — PROCALCITONIN: Procalcitonin: 0.3 ng/mL

## 2023-03-01 LAB — CORTISOL-AM, BLOOD: Cortisol - AM: 16.4 ug/dL (ref 6.7–22.6)

## 2023-03-01 MED ORDER — INSULIN ASPART 100 UNIT/ML IJ SOLN: 0.0000 [IU] | Freq: Every day | INTRAMUSCULAR | Status: DC

## 2023-03-01 MED ORDER — SODIUM CHLORIDE 0.9 % IV SOLN
500.0000 mg | INTRAVENOUS | Status: DC
  Administered 2023-03-01: 500 mg via INTRAVENOUS
  Filled 2023-03-01: qty 5

## 2023-03-01 MED ORDER — FINASTERIDE 5 MG PO TABS
5.0000 mg | ORAL_TABLET | Freq: Every day | ORAL | Status: DC
  Administered 2023-03-01: 5 mg via ORAL
  Filled 2023-03-01: qty 1

## 2023-03-01 MED ORDER — ALBUTEROL SULFATE (2.5 MG/3ML) 0.083% IN NEBU: 2.5000 mg | INHALATION_SOLUTION | RESPIRATORY_TRACT | Status: DC | PRN

## 2023-03-01 MED ORDER — ACETAMINOPHEN 325 MG PO TABS
650.0000 mg | ORAL_TABLET | Freq: Four times a day (QID) | ORAL | Status: DC | PRN
  Administered 2023-03-01 – 2023-03-03 (×4): 650 mg via ORAL
  Filled 2023-03-01 (×4): qty 2

## 2023-03-01 MED ORDER — ONDANSETRON HCL 4 MG/2ML IJ SOLN: 4.0000 mg | Freq: Four times a day (QID) | INTRAMUSCULAR | Status: DC | PRN

## 2023-03-01 MED ORDER — INSULIN ASPART 100 UNIT/ML IJ SOLN
0.0000 [IU] | Freq: Three times a day (TID) | INTRAMUSCULAR | Status: DC
  Administered 2023-03-01: 2 [IU] via SUBCUTANEOUS
  Filled 2023-03-01: qty 1

## 2023-03-01 MED ORDER — ACETAMINOPHEN 650 MG RE SUPP: 650.0000 mg | Freq: Four times a day (QID) | RECTAL | Status: DC | PRN

## 2023-03-01 MED ORDER — FERROUS SULFATE 325 (65 FE) MG PO TABS
325.0000 mg | ORAL_TABLET | ORAL | Status: DC
  Administered 2023-03-02: 325 mg via ORAL
  Filled 2023-03-01: qty 1

## 2023-03-01 MED ORDER — ENOXAPARIN SODIUM 30 MG/0.3ML IJ SOSY
30.0000 mg | PREFILLED_SYRINGE | INTRAMUSCULAR | Status: DC
  Administered 2023-03-01 – 2023-03-03 (×3): 30 mg via SUBCUTANEOUS
  Filled 2023-03-01 (×3): qty 0.3

## 2023-03-01 MED ORDER — ASPIRIN 81 MG PO TBEC
81.0000 mg | DELAYED_RELEASE_TABLET | Freq: Every day | ORAL | Status: DC
  Administered 2023-03-01 – 2023-03-03 (×3): 81 mg via ORAL
  Filled 2023-03-01 (×3): qty 1

## 2023-03-01 MED ORDER — GUAIFENESIN-DM 100-10 MG/5ML PO SYRP
5.0000 mL | ORAL_SOLUTION | ORAL | Status: DC | PRN
  Administered 2023-03-01 – 2023-03-03 (×9): 5 mL via ORAL
  Filled 2023-03-01 (×9): qty 10

## 2023-03-01 MED ORDER — ROSUVASTATIN CALCIUM 20 MG PO TABS
40.0000 mg | ORAL_TABLET | Freq: Every day | ORAL | Status: DC
  Administered 2023-03-01 – 2023-03-03 (×3): 40 mg via ORAL
  Filled 2023-03-01 (×2): qty 2
  Filled 2023-03-01: qty 4
  Filled 2023-03-01: qty 2
  Filled 2023-03-01: qty 4

## 2023-03-01 MED ORDER — LACTATED RINGERS IV SOLN
150.0000 mL/h | INTRAVENOUS | Status: DC
  Administered 2023-03-01: 150 mL/h via INTRAVENOUS

## 2023-03-01 MED ORDER — TRAZODONE HCL 50 MG PO TABS
50.0000 mg | ORAL_TABLET | Freq: Every evening | ORAL | Status: DC | PRN
  Administered 2023-03-02: 50 mg via ORAL
  Filled 2023-03-01 (×2): qty 1

## 2023-03-01 MED ORDER — DOCUSATE SODIUM 100 MG PO CAPS
100.0000 mg | ORAL_CAPSULE | Freq: Every day | ORAL | Status: DC | PRN
  Administered 2023-03-02: 100 mg via ORAL
  Filled 2023-03-01: qty 1

## 2023-03-01 MED ORDER — ONDANSETRON HCL 4 MG PO TABS: 4.0000 mg | ORAL_TABLET | Freq: Four times a day (QID) | ORAL | Status: DC | PRN

## 2023-03-01 MED ORDER — SODIUM CHLORIDE 0.9 % IV SOLN
2.0000 g | INTRAVENOUS | Status: DC
  Administered 2023-03-01: 2 g via INTRAVENOUS
  Filled 2023-03-01: qty 20

## 2023-03-01 NOTE — Progress Notes (Signed)
Patient arrived to unit from ED alert and orientated, no respiratory distress. Brought to unit by RN on stretcher. 2 iv intact to rt hand. Daughter at bedside. Ambulated to bathroom with x 1 assist. Assisted to bed and made comfortable. Orientated to room and call bell.bed adjusted in room patient does not like the air from the vent directly on her.

## 2023-03-01 NOTE — Assessment & Plan Note (Signed)
Hemoglobin at baseline Continue ferrous sulfate

## 2023-03-01 NOTE — Assessment & Plan Note (Signed)
Sliding scale insulin coverage 

## 2023-03-01 NOTE — Progress Notes (Signed)
Anticoagulation monitoring(Lovenox):  89 yo female ordered Lovenox 40 mg Q24h    There were no vitals filed for this visit. BMI 28.5    Lab Results  Component Value Date   CREATININE 1.21 (H) 02/28/2023   CREATININE 1.31 (H) 02/05/2023   CREATININE 1.28 (H) 01/08/2023   Estimated Creatinine Clearance: 26.1 mL/min (A) (by C-G formula based on SCr of 1.21 mg/dL (H)). Hemoglobin & Hematocrit     Component Value Date/Time   HGB 9.9 (L) 02/28/2023 2318   HGB 9.7 (L) 02/05/2023 1429   HGB 9.7 (L) 10/20/2014 1508   HCT 30.8 (L) 02/28/2023 2318   HCT 31.0 (L) 10/20/2014 1508     Per Protocol for Patient with estCrcl < 30 ml/min and BMI < 30, will transition to Lovenox 30 mg Q24h.

## 2023-03-01 NOTE — Progress Notes (Signed)
  PROGRESS NOTE    Jocelyn Elliott  JKK:[id number] DOB: Oct 10, 1932 DOA: 02/28/2023 PCP: Dale [place], MD  [id number]-[id number]  LOS: 0 days   Brief hospital course:   Assessment & Plan: Jocelyn Elliott is a 89 y.o. female with medical history significant for CAD, DM, iron deficiency anemia followed by oncology, CKD 3b,, HTN, who presents via EMS with shortness of breath.  Patient has had a sinus infection and congestion for the past5 weeks and completed a course of Z-Pak about a week prior.  Over the past several days she has had increased cough and congestion as well as wheezing and dyspnea on exertion.  She went to see her PCP who directed her to to the ED for further workup.    Respiratory distress Parainfluenza Virus 3 infection --cough and congestion, likely due to URI. --covid neg.  RVP obtained today, returned positive for Parainfluenza Virus 3  --supportive care  * CAP (community acquired pneumonia) --CXR showed "Right basilar patchy opacities, suspicious for pneumonia."  CXR personally reviewed, opacities not significant, and could also be from viral PNA, but given pt has been sick for weeks, will treat as CAP. --cont ceftriaxone and azithromycin  Sepsis, ruled out   Stage 3b chronic kidney disease Renal function at baseline  IDA (iron deficiency anemia) Hemoglobin at baseline Continue ferrous sulfate  Diabetes mellitus --A1c 5.1. --d/c BG checks and SSI  Hx of Hypertension, not currently active Soft blood pressure Not currently on antihypertensives  CAD (coronary artery disease) Stable Continue aspirin and rosuvastatin   DVT prophylaxis: Lovenox SQ Code Status: Full code  Family Communication: son-in-law updated at bedside today Level of care: Telemetry Medical Dispo:   The patient is from: home Anticipated d/c is to: home Anticipated d/c date is: tomorrow   Subjective and Interval History:  Pt reported feeling better, coughing up  sputum.   Objective: Vitals:   03/01/23 0020 03/01/23 0138 03/01/23 0813 03/01/23 1533  BP:  124/89 101/68 (!) 128/57  Pulse:  95 (!) 101 90  Resp:  18 20 18   Temp: 98.7 F (37.1 C) 98.7 F (37.1 C) 98.1 F (36.7 C) 98.8 F (37.1 C)  TempSrc: Oral     SpO2:  95% 98% 99%    Intake/Output Summary (Last 24 hours) at 03/01/2023 1941 Last data filed at 03/01/2023 1426 Gross per 24 hour  Intake 1223.45 ml  Output 300 ml  Net 923.45 ml   There were no vitals filed for this visit.  Examination:   Constitutional: NAD, AAOx3 HEENT: conjunctivae and lids normal, EOMI CV: No cyanosis.   RESP: normal respiratory effort, on RA Neuro: II - XII grossly intact.   Psych: Normal mood and affect.  Appropriate judgement and reason   Data Reviewed: I have personally reviewed labs and imaging studies  Time spent: 50 minutes  Darlin Priestly, MD [facility] If 7PM-7AM, please contact night-coverage 03/01/2023, 7:41 PM

## 2023-03-01 NOTE — Telephone Encounter (Signed)
FYI for you- patient was admitted.

## 2023-03-01 NOTE — Assessment & Plan Note (Signed)
Soft blood pressure Not currently on antihypertensives

## 2023-03-01 NOTE — TOC Initial Note (Signed)
Transition of Care [facility]) - Initial/Assessment Note    Patient Details  Name: Jocelyn Elliott MRN: [id number] Date of Birth: 1932/08/28  Transition of Care [facility]) CM/SW Contact:    Garret Reddish, RN Phone Number: 03/01/2023, 2:56 PM  Clinical Narrative:      Chart reviewed.  Noted that patient was admitted with Community Acquired Pneumonia and Sepsis.  Patient is currently on IV Rocephin and IV Azithromycin.  Patient is also receiving antitussives and albuterol, 02 as needed.    I have meet with patient at bedside.  Patient reports that prior to admission she lived at home by herself. Her daughter lives very near to her and comes over daily to help with meals.  Her family also assist with groceries and errands.  Her daughter transports her to appointments.  Patient reports that she has rolling walker and a raised toilet seat at home.  Patient does not have home 02 at home.  Patient is currently not requiring home o2 at this time while in hospital.  Patient's PCP is [employer].  [facility] [place].  Patient plans on returning home on discharge with the support of her family. No TOC identified at this time.                Expected Discharge Plan: Home/Self Care (Home with support of family) Barriers to Discharge: No Barriers Identified   Patient Goals and CMS Choice            Expected Discharge Plan and Services       Living arrangements for the past 2 months: Single Family Home                                      Prior Living Arrangements/Services Living arrangements for the past 2 months: Single Family Home Lives with:: Self   Do you feel safe going back to the place where you live?: Yes      Need for Family Participation in Patient Care: Yes (Comment) Care giver support system in place?: Yes (comment) (Patient has supportive daughter and son-in law)      Activities of Daily Living Home Assistive Devices/Equipment: Environmental consultant (specify type)  (standard) ADL Screening (condition at time of admission) Patient's cognitive ability adequate to safely complete daily activities?: Yes Is the patient deaf or have difficulty hearing?: No Does the patient have difficulty seeing, even when wearing glasses/contacts?: Yes Does the patient have difficulty concentrating, remembering, or making decisions?: No Patient able to express need for assistance with ADLs?: Yes Does the patient have difficulty dressing or bathing?: No Independently performs ADLs?: No Communication: Independent Dressing (OT): Independent Grooming: Independent Feeding: Independent Bathing: Needs assistance Is this a change from baseline?: Change from baseline, expected to last <3 days Toileting: Needs assistance Is this a change from baseline?: Change from baseline, expected to last <3 days In/Out Bed: Independent Walks in Home: Independent, Independent with device (comment) (ocassionally use a walker) Does the patient have difficulty walking or climbing stairs?: Yes Weakness of Legs: Left Weakness of Arms/Hands: None  Permission Sought/Granted   Permission granted to share information with : Yes, Verbal Permission Granted              Emotional Assessment Appearance:: Appears younger than stated age Attitude/Demeanor/Rapport: Engaged Affect (typically observed): Pleasant Orientation: : Oriented to Self, Oriented to Place, Oriented to  Time, Oriented to Situation  Admission diagnosis:  Sepsis [A41.9] Patient Active Problem List   Diagnosis Date Noted   Sepsis 03/01/2023   CAP (community acquired pneumonia) 03/01/2023   SOB (shortness of breath) 02/28/2023   Sinusitis 11/16/2022   Congestion of nasal sinus 11/09/2021   Sinus headache 04/11/2021   Ear pain, left 04/11/2021   Post-nasal drip 04/11/2021   COVID-19 04/11/2021   Aortic atherosclerosis 04/10/2021   Thrombocytopenia 04/10/2021   Fall 04/10/2021   Difficulty with speech 02/12/2021    Peripheral edema 01/02/2021   Cough 12/10/2020   Trigger finger, right ring finger 03/05/2020   Anemia of chronic kidney failure, stage 3 (moderate) 07/20/2019   Chest pain 04/17/2019   Bradycardia 02/02/2019   Afib 02/02/2019   Stage 3b chronic kidney disease 10/18/2018   Rash 06/14/2018   SI (sacroiliac) joint dysfunction 01/07/2018   Dizziness 05/22/2017   History of total left hip replacement 05/17/2017   Trochanteric bursitis of left hip 05/17/2017   IDA (iron deficiency anemia) 09/20/2016   Hypotension 04/06/2016   Respiratory infection 04/06/2016   Other specified respiratory disorders 04/06/2016   Health care maintenance 10/28/2015   2-vessel coronary artery disease 09/22/2015   SOB (shortness of breath) on exertion 09/20/2015   Knee pain, bilateral 09/20/2015   Sleeping difficulty 07/24/2015   Hepatomegaly 07/24/2015   Sleep disorder 07/24/2015   Hepatomegaly, not elsewhere classified 07/24/2015   Generalized anxiety disorder 04/23/2015   UTI (urinary tract infection) 05/22/2014   Hip pain 10/11/2013   Pain in hip 10/11/2013   Tendonitis 07/19/2013   Leg numbness 04/07/2013   CAD (coronary artery disease) 09/05/2012   Hypertension 09/05/2012   Hypercholesteremia 09/05/2012   Diabetes mellitus 09/05/2012   Carotid arterial disease 09/05/2012   Normocytic anemia 09/05/2012   Disorder of artery or arteriole 09/05/2012   Essential (primary) hypertension 09/05/2012   Anemia 09/05/2012   PCP:  Dale Ellsworth, MD Pharmacy:   [phone] [place], [place] - [address] [place] [address] Phone: [phone] Fax: [phone]  [facility] - [place], [place] - [address] [place] [address] Phone: [phone] Fax: [phone]     Social Determinants of Health (SDOH) Social History: SDOH Screenings   Food Insecurity: No Food Insecurity (03/01/2023)  Housing: Low Risk  (03/01/2023)   Transportation Needs: No Transportation Needs (03/01/2023)  Utilities: Not At Risk (03/01/2023)  Depression (PHQ2-9): Low Risk  (03/27/2022)  Financial Resource Strain: Low Risk  (03/27/2022)  Social Connections: Unknown (03/27/2022)  Stress: No Stress Concern Present (03/27/2022)  Tobacco Use: Medium Risk (03/01/2023)   SDOH Interventions:     Readmission Risk Interventions     No data to display

## 2023-03-01 NOTE — H&P (Signed)
History and Physical    Patient: Jocelyn Elliott ZOX:[id number] DOB: 1932/07/31 DOA: 02/28/2023 DOS: the patient was seen and examined on 03/01/2023 PCP: Dale Jennings, MD  Patient coming from: Home  Chief Complaint:  Chief Complaint  Patient presents with   Shortness of Breath    HPI: Jocelyn Elliott is a 89 y.o. female with medical history significant for CAD, DM, iron deficiency anemia followed by oncology, CKD 3b,, HTN, who presents via EMS with shortness of breath.  Patient has had a sinus infection and congestion for the past5 weeks and completed a course of Z-Pak about a week prior.  Over the past several days she has had increased cough and congestion as well as wheezing and dyspnea on exertion.  She went to see her PCP who directed her to to the ED for further workup.  Patient denies fever or chest pain. ED course and data review: Tmax 99.4 with pulse 104 and O2 sat 93% on room air BP 105/55.  Labs notable for leukocytosis of 11,800, hemoglobin at baseline at 9.9.  Lactic acid 1.3.  Troponin pending.  Sodium 129, creatinine at baseline at 1.21. EKG, personally viewed and interpreted showing NSR at 97 with no acute ST-T wave changes. Chest x-ray showed right basilar patchy opacities suspicious for pneumonia Patient started on sepsis fluids, Rocephin and azithromycin Hospitalist consulted for admission for sepsis secondary to pneumonia    Past Medical History:  Diagnosis Date   Arthritis    AV malformation of gastrointestinal tract    Blood in stool    CAD (coronary artery disease)    Carotid arterial disease    Cataracts, bilateral    Chronic blood loss anemia    Chronic cystitis    CKD (chronic kidney disease), stage III    Complication of anesthesia    reaction to propofol - caused heart to pause   Depression    Diabetes mellitus    GERD (gastroesophageal reflux disease)    Hyperlipidemia    Hypertension    IDA (iron deficiency anemia)    Stress incontinence     TIA (transient ischemic attack) 03/2021   No deficits   Wears dentures    full upper and lower   Past Surgical History:  Procedure Laterality Date   ABDOMINAL HYSTERECTOMY  11/19/1970   ovaries left in place   APPENDECTOMY  11/19/1990   BACK SURGERY  06/08/2010   L3, L4, L5   CARDIAC CATHETERIZATION  02/2001   CAROTID ARTERY ANGIOPLASTY  09/19/2002   CARPAL TUNNEL RELEASE  1991, 92   right then left    CATARACT EXTRACTION W/PHACO Left 02/13/2022   Procedure: CATARACT EXTRACTION PHACO AND INTRAOCULAR LENS PLACEMENT (IOC) LEFT DIABETIC 14.90 01:26.8;  Surgeon: Galen Manila, MD;  Location: [facility];  Service: Ophthalmology;  Laterality: Left;  Diabetic   CATARACT EXTRACTION W/PHACO Right 03/06/2022   Procedure: CATARACT EXTRACTION PHACO AND INTRAOCULAR LENS PLACEMENT (IOC) RIGHT DIABETIC 9.66 00:53.7;  Surgeon: Galen Manila, MD;  Location: [facility];  Service: Ophthalmology;  Laterality: Right;  Diabetic   CHOLECYSTECTOMY  11/20/1991   COLONOSCOPY WITH PROPOFOL N/A 02/02/2019   Procedure: COLONOSCOPY WITH PROPOFOL;  Surgeon: Scot Jun, MD;  Location: [facility] END[facility];  Service: Endoscopy;  Laterality: N/A;   ESOPHAGOGASTRODUODENOSCOPY N/A 02/02/2019   Procedure: ESOPHAGOGASTRODUODENOSCOPY (EGD);  Surgeon: Scot Jun, MD;  [facility];  Service: Endoscopy;  Laterality: N/A;   FOOT SURGERY  06/20/2007   Left   right carotid artery surgery  01/09/2013   Dr. Gilda Crease @ [facility]   TOTAL HIP ARTHROPLASTY  05/03/2011   left 12, right 11/07   Social History:  reports that she quit smoking about 33 years ago. Her smoking use included cigarettes. She has never used smokeless tobacco. She reports that she does not drink alcohol and does not use drugs.  Allergies  Allergen Reactions   Propofol Other (See Comments)    Caused heart to pause   Ciprofloxacin Rash   Levaquin [Levofloxacin] Rash   Tequin [Gatifloxacin] Hives and Rash    Family  History  Problem Relation Age of Onset   Hyperlipidemia Father    Heart disease Father        myocardial infarction   Hypertension Father        Parent   Arthritis Other        parent   Diabetes Other        nephew   Cervical cancer Sister    Rectal cancer Sister    Breast cancer Neg Hx     Prior to Admission medications   Medication Sig Start Date End Date Taking? Authorizing Provider  aspirin 81 MG tablet Take 81 mg by mouth daily.    [provider]  Biotin 5000 MCG CAPS Take 1 capsule by mouth daily.    [provider]  blood glucose meter kit and supplies KIT Dispense based on insurance preference. Use daily to check sugars once daily. Dx e11.9 09/29/19   Dale Lyman, MD  cetirizine (ZYRTEC) 10 MG tablet Take 10 mg by mouth daily.    [provider]  docusate sodium (COLACE) 100 MG capsule Take 100 mg by mouth daily as needed for mild constipation.    [provider]  ferrous sulfate 325 (65 FE) MG EC tablet Take 325 mg by mouth every other day. Every other day    [provider]  finasteride (PROSCAR) 5 MG tablet TAKE 1 TABLET (5 MG TOTAL) BY MOUTH DAILY. 01/07/23   Willeen Niece, MD  Lancets [facility] DELICA PLUS LANCET30G) MISC USE AS INSTRUCTED TO CHECK BLOOD SUGARS ONCE DAILY. DX E 11.9 03/19/22   Dale Shell, MD  Omega-3 Fatty Acids (FISH OIL) 1200 MG CAPS Take 1 capsule by mouth daily.    [provider]  ONETOUCH ULTRA test strip USE AS INSTRUCTED TO CHECK BLOOD SUGARS ONCE DAILY. 12/31/22   Dale [place], MD  Probiotic, Lactobacillus, CAPS  01/03/22   [provider]  rosuvastatin (CRESTOR) 40 MG tablet TAKE 1 TABLET BY MOUTH EVERY DAY 01/31/23   Dale [place], MD  traZODone (DESYREL) 50 MG tablet TAKE 1/2 TO 1 TABLET BY MOUTH AT BEDTIME AS NEEDED FOR SLEEP 06/20/22   Dale [place], MD    Physical Exam: Vitals:   02/28/23 1816 02/28/23 1819  BP:  (!) 105/55  Pulse: (!) 104   Resp:  20  Temp: 99.4  F (37.4 C)   SpO2:  93%   Physical Exam Vitals and nursing note reviewed.  Constitutional:      General: She is not in acute distress. HENT:     Head: Normocephalic and atraumatic.  Cardiovascular:     Rate and Rhythm: Normal rate and regular rhythm.     Heart sounds: Normal heart sounds.  Pulmonary:     Effort: Tachypnea present.     Breath sounds: Normal breath sounds.  Abdominal:     Palpations: Abdomen is soft.     Tenderness: There is no abdominal tenderness.  Neurological:     Mental Status: Mental status is at baseline.     Labs on Admission: I have personally reviewed following labs and imaging studies  CBC: Recent Labs  Lab 02/28/23 1811 02/28/23 2318  WBC 10.2 11.8*  NEUTROABS  --  10.0*  HGB 10.7* 9.9*  HCT 33.3* 30.8*  MCV 100.9* 101.7*  PLT 238 232   Basic Metabolic Panel: Recent Labs  Lab 02/28/23 1811  NA 129*  K 3.5  CL 99  CO2 21*  GLUCOSE 137*  BUN 21  CREATININE 1.21*  CALCIUM 8.4*   GFR: Estimated Creatinine Clearance: 26.1 mL/min (A) (by C-G formula based on SCr of 1.21 mg/dL (H)). Liver Function Tests: No results for input(s): "AST", "ALT", "ALKPHOS", "BILITOT", "PROT", "ALBUMIN" in the last 168 hours. No results for input(s): "LIPASE", "AMYLASE" in the last 168 hours. No results for input(s): "AMMONIA" in the last 168 hours. Coagulation Profile: Recent Labs  Lab 02/28/23 2318  INR 1.2   Cardiac Enzymes: No results for input(s): "CKTOTAL", "CKMB", "CKMBINDEX", "TROPONINI" in the last 168 hours. BNP (last 3 results) No results for input(s): "PROBNP" in the last 8760 hours. HbA1C: No results for input(s): "HGBA1C" in the last 72 hours. CBG: No results for input(s): "GLUCAP" in the last 168 hours. Lipid Profile: No results for input(s): "CHOL", "HDL", "LDLCALC", "TRIG", "CHOLHDL", "LDLDIRECT" in the last 72 hours. Thyroid Function Tests: No results for input(s): "TSH", "T4TOTAL", "FREET4", "T3FREE", "THYROIDAB" in the last  72 hours. Anemia Panel: No results for input(s): "VITAMINB12", "FOLATE", "FERRITIN", "TIBC", "IRON", "RETICCTPCT" in the last 72 hours. Urine analysis:    Component Value Date/Time   COLORURINE YELLOW (A) 09/29/2018 1444   APPEARANCEUR CLOUDY (A) 09/29/2018 1444   LABSPEC 1.015 09/29/2018 1444   PHURINE 5.0 09/29/2018 1444   GLUCOSEU NEGATIVE 09/29/2018 1444   GLUCOSEU NEGATIVE 06/22/2015 1331   HGBUR MODERATE (A) 09/29/2018 1444   BILIRUBINUR negative 10/13/2018 1116   BILIRUBINUR neg 05/19/2014 0811   KETONESUR negative 10/13/2018 1116   KETONESUR NEGATIVE 09/29/2018 1444   PROTEINUR =100 (A) 10/13/2018 1116   PROTEINUR 100 (A) 09/29/2018 1444   UROBILINOGEN 0.2 10/13/2018 1116   UROBILINOGEN 0.2 06/22/2015 1331   NITRITE Negative 10/13/2018 1116   NITRITE NEGATIVE 09/29/2018 1444   LEUKOCYTESUR Small (1+) (A) 10/13/2018 1116    Radiological Exams on Admission: DG Chest 2 View  Result Date: 02/28/2023 CLINICAL DATA:  Shortness of breath EXAM: CHEST - 2 VIEW COMPARISON:  Chest radiograph dated 02/04/2019 FINDINGS: Normal lung volumes. Right basilar patchy opacities. No pleural effusion or pneumothorax. The heart size and mediastinal contours are within normal limits. Partially imaged lumbar spinal fixation hardware appears intact. IMPRESSION: Right basilar patchy opacities, suspicious for pneumonia. Electronically Signed   By: Agustin Cree M.D.   On: 02/28/2023 18:53     Data Reviewed: Relevant notes from primary care and specialist visits, past discharge summaries as available in EHR, including Care Everywhere. Prior diagnostic testing as pertinent to current admission diagnoses Updated medications and problem lists for reconciliation ED course, including vitals, labs, imaging, treatment and response to treatment Triage notes, nursing and pharmacy notes and ED provider's notes Notable results as noted in HPI   Assessment and Plan: * CAP (community acquired  pneumonia) Sepsis Sepsis criteria includes fever, tachycardia, leukocytosis, pneumonia Rocephin and azithromycin Sepsis fluids Antitussives, albuterol as needed, incentive spirometer Oxygen if needed  Stage 3b chronic kidney disease Renal function at baseline  IDA (iron deficiency anemia) Hemoglobin at baseline Continue  ferrous sulfate  Diabetes mellitus Sliding scale insulin coverage  Hypertension Soft blood pressure Not currently on antihypertensives  CAD (coronary artery disease) Stable Continue aspirin and rosuvastatin        DVT prophylaxis: Lovenox  Consults: none  Advance Care Planning:   Code Status: Prior   Family Communication: none  Disposition Plan: Back to previous home environment  Severity of Illness: The appropriate patient status for this patient is INPATIENT. Inpatient status is judged to be reasonable and necessary in order to provide the required intensity of service to ensure the patient's safety. The patient's presenting symptoms, physical exam findings, and initial radiographic and laboratory data in the context of their chronic comorbidities is felt to place them at high risk for further clinical deterioration. Furthermore, it is not anticipated that the patient will be medically stable for discharge from the hospital within 2 midnights of admission.   * I certify that at the point of admission it is my clinical judgment that the patient will require inpatient hospital care spanning beyond 2 midnights from the point of admission due to high intensity of service, high risk for further deterioration and high frequency of surveillance required.*  Author: Andris Baumann, MD 03/01/2023 12:06 AM  For on call review [email or web address].

## 2023-03-01 NOTE — Telephone Encounter (Signed)
Patient's daughter [name] called, patient was admitted to the hospital yesterday.

## 2023-03-01 NOTE — Assessment & Plan Note (Signed)
Sepsis Sepsis criteria includes fever, tachycardia, leukocytosis, pneumonia Rocephin and azithromycin Sepsis fluids Antitussives, albuterol as needed, incentive spirometer Oxygen if needed

## 2023-03-01 NOTE — Assessment & Plan Note (Signed)
Renal function at baseline 

## 2023-03-01 NOTE — Progress Notes (Signed)
CODE SEPSIS - PHARMACY COMMUNICATION  **Broad Spectrum Antibiotics should be administered within 1 hour of Sepsis diagnosis**  Time Code Sepsis Called/Page Received: 4/11 @ 2336  Antibiotics Ordered: Azithromycin , Ceftriaxone   Time of 1st antibiotic administration: Ceftriaxone 2 gm IV X 1 on 4/11 @ 2343   Additional action taken by pharmacy: messaged RN @ 2319 to remind to give abx   If necessary, Name of Provider/Nurse Contacted:     Herman Mell D ,PharmD Clinical Pharmacist  03/01/2023  12:39 AM

## 2023-03-01 NOTE — Assessment & Plan Note (Signed)
   Stable Continue aspirin and rosuvastatin 

## 2023-03-02 DIAGNOSIS — J189 Pneumonia, unspecified organism: Secondary | ICD-10-CM

## 2023-03-02 LAB — BASIC METABOLIC PANEL
Anion gap: 10 (ref 5–15)
BUN: 18 mg/dL (ref 8–23)
CO2: 22 mmol/L (ref 22–32)
Calcium: 8.2 mg/dL — ABNORMAL LOW (ref 8.9–10.3)
Chloride: 104 mmol/L (ref 98–111)
Creatinine, Ser: 1.13 mg/dL — ABNORMAL HIGH (ref 0.44–1.00)
GFR, Estimated: 47 mL/min — ABNORMAL LOW (ref >60)
Glucose, Bld: 107 mg/dL — ABNORMAL HIGH (ref 70–99)
Potassium: 3 mmol/L — ABNORMAL LOW (ref 3.5–5.1)
Sodium: 136 mmol/L (ref 135–145)

## 2023-03-02 LAB — CBC
HCT: 29.3 % — ABNORMAL LOW (ref 36.0–46.0)
Hemoglobin: 9.3 g/dL — ABNORMAL LOW (ref 12.0–15.0)
MCH: 32.4 pg (ref 26.0–34.0)
MCHC: 31.7 g/dL (ref 30.0–36.0)
MCV: 102.1 fL — ABNORMAL HIGH (ref 80.0–100.0)
Platelets: 229 K/uL (ref 150–400)
RBC: 2.87 MIL/uL — ABNORMAL LOW (ref 3.87–5.11)
RDW: 13.8 % (ref 11.5–15.5)
WBC: 10.2 K/uL (ref 4.0–10.5)
nRBC: 0 % (ref 0.0–0.2)

## 2023-03-02 LAB — CULTURE, BLOOD (ROUTINE X 2): Culture: NO GROWTH

## 2023-03-02 LAB — MAGNESIUM: Magnesium: 2 mg/dL (ref 1.7–2.4)

## 2023-03-02 MED ORDER — POTASSIUM CHLORIDE CRYS ER 20 MEQ PO TBCR
40.0000 meq | EXTENDED_RELEASE_TABLET | Freq: Once | ORAL | Status: AC
  Administered 2023-03-02: 40 meq via ORAL
  Filled 2023-03-02: qty 2

## 2023-03-02 MED ORDER — ACETYLCYSTEINE 20 % IN SOLN
4.0000 mL | Freq: Once | RESPIRATORY_TRACT | Status: AC
  Administered 2023-03-02: 4 mL via RESPIRATORY_TRACT
  Filled 2023-03-02: qty 4

## 2023-03-02 MED ORDER — IPRATROPIUM-ALBUTEROL 0.5-2.5 (3) MG/3ML IN SOLN
3.0000 mL | Freq: Once | RESPIRATORY_TRACT | Status: AC
  Administered 2023-03-02: 3 mL via RESPIRATORY_TRACT
  Filled 2023-03-02: qty 3

## 2023-03-02 MED ORDER — CEFDINIR 300 MG PO CAPS
300.0000 mg | ORAL_CAPSULE | ORAL | Status: DC
  Administered 2023-03-02: 300 mg via ORAL
  Filled 2023-03-02: qty 1

## 2023-03-02 MED ORDER — ACETYLCYSTEINE 20 % IN SOLN
4.0000 mL | Freq: Two times a day (BID) | RESPIRATORY_TRACT | Status: DC
  Administered 2023-03-03: 4 mL via RESPIRATORY_TRACT
  Filled 2023-03-02 (×2): qty 4

## 2023-03-02 MED ORDER — IPRATROPIUM-ALBUTEROL 0.5-2.5 (3) MG/3ML IN SOLN
3.0000 mL | Freq: Two times a day (BID) | RESPIRATORY_TRACT | Status: DC
  Administered 2023-03-03: 3 mL via RESPIRATORY_TRACT
  Filled 2023-03-02: qty 3

## 2023-03-02 MED ORDER — ACETYLCYSTEINE 20 % IN SOLN
4.0000 mL | Freq: Two times a day (BID) | RESPIRATORY_TRACT | Status: DC
  Filled 2023-03-02 (×2): qty 4

## 2023-03-02 MED ORDER — IPRATROPIUM-ALBUTEROL 0.5-2.5 (3) MG/3ML IN SOLN
3.0000 mL | Freq: Two times a day (BID) | RESPIRATORY_TRACT | Status: DC
  Administered 2023-03-02: 3 mL via RESPIRATORY_TRACT
  Filled 2023-03-02: qty 3

## 2023-03-02 NOTE — Progress Notes (Signed)
  PROGRESS NOTE    Jocelyn Elliott  ZHG:[id number] DOB: 12/12/1931 DOA: 02/28/2023 PCP: Dale [place], MD  136A/136A-AA  LOS: 1 day   Brief hospital course:   Assessment & Plan: Jocelyn Elliott is a 89 y.o. female with medical history significant for CAD, DM, iron deficiency anemia followed by oncology, CKD 3b,, HTN, who presents via EMS with shortness of breath.  Patient has had a sinus infection and congestion for the past5 weeks and completed a course of Z-Pak about a week prior.  Over the past several days she has had increased cough and congestion as well as wheezing and dyspnea on exertion.  She went to see her PCP who directed her to to the ED for further workup.    Respiratory distress Parainfluenza Virus 3 infection --cough and congestion, likely due to URI. --covid neg.  RVP obtained today, returned positive for Parainfluenza Virus 3  --supportive care --Mucomyst Neb BID  * CAP (community acquired pneumonia) --CXR showed "Right basilar patchy opacities, suspicious for pneumonia."  CXR personally reviewed, opacities not significant, and could also be from viral PNA, but given pt has been sick for weeks, started treatment as CAP as ceftriaxone and azithromycin --transition to cefdinir today  Sepsis, ruled out   Stage 3b chronic kidney disease Renal function at baseline  IDA (iron deficiency anemia) Hemoglobin at baseline Continue ferrous sulfate  Diabetes mellitus --A1c 5.1. --d/c'ed BG checks and SSI  Hx of Hypertension, not currently active Soft blood pressure Not currently on antihypertensives  CAD (coronary artery disease) Stable Continue aspirin and rosuvastatin   DVT prophylaxis: Lovenox SQ Code Status: Full code  Family Communication: daughter updated at bedside today Level of care: Telemetry Medical Dispo:   The patient is from: home Anticipated d/c is to: home Anticipated d/c date is: tomorrow   Subjective and Interval History:  Pt said  she felt weak and was not ready to go home today.  Continue to cough with difficulty bringing up sputum.   Objective: Vitals:   03/01/23 1533 03/01/23 2302 03/02/23 0817 03/02/23 1550  BP: (!) 128/57 127/87 (!) 124/56 (!) 112/57  Pulse: 90 (!) 102 81 77  Resp: 18  17 18   Temp: 98.8 F (37.1 C) 98.1 F (36.7 C) 99 F (37.2 C) 98 F (36.7 C)  TempSrc:      SpO2: 99% 95% 96% 99%    Intake/Output Summary (Last 24 hours) at 03/02/2023 1815 Last data filed at 03/02/2023 1449 Gross per 24 hour  Intake 590.06 ml  Output 300 ml  Net 290.06 ml   There were no vitals filed for this visit.  Examination:   Constitutional: NAD, AAOx3 HEENT: conjunctivae and lids normal, EOMI CV: No cyanosis.   RESP: normal respiratory effort, on RA Neuro: II - XII grossly intact.   Psych: Normal mood and affect.  Appropriate judgement and reason   Data Reviewed: I have personally reviewed labs and imaging studies  Time spent: 35 minutes  Darlin Priestly, MD [facility] If 7PM-7AM, please contact night-coverage 03/02/2023, 6:15 PM

## 2023-03-02 NOTE — Telephone Encounter (Signed)
Called and spoke to Robin.

## 2023-03-03 DIAGNOSIS — B348 Other viral infections of unspecified site: Secondary | ICD-10-CM | POA: Diagnosis not present

## 2023-03-03 LAB — BASIC METABOLIC PANEL
Anion gap: 9 (ref 5–15)
BUN: 20 mg/dL (ref 8–23)
CO2: 22 mmol/L (ref 22–32)
Calcium: 8.2 mg/dL — ABNORMAL LOW (ref 8.9–10.3)
Chloride: 104 mmol/L (ref 98–111)
Creatinine, Ser: 1.12 mg/dL — ABNORMAL HIGH (ref 0.44–1.00)
GFR, Estimated: 47 mL/min — ABNORMAL LOW (ref >60)
Glucose, Bld: 123 mg/dL — ABNORMAL HIGH (ref 70–99)
Potassium: 3.5 mmol/L (ref 3.5–5.1)
Sodium: 135 mmol/L (ref 135–145)

## 2023-03-03 LAB — CBC
HCT: 27.7 % — ABNORMAL LOW (ref 36.0–46.0)
Hemoglobin: 8.9 g/dL — ABNORMAL LOW (ref 12.0–15.0)
MCH: 32.4 pg (ref 26.0–34.0)
MCHC: 32.1 g/dL (ref 30.0–36.0)
MCV: 100.7 fL — ABNORMAL HIGH (ref 80.0–100.0)
Platelets: 239 10*3/uL (ref 150–400)
RBC: 2.75 MIL/uL — ABNORMAL LOW (ref 3.87–5.11)
RDW: 14 % (ref 11.5–15.5)
WBC: 8.8 10*3/uL (ref 4.0–10.5)
nRBC: 0 % (ref 0.0–0.2)

## 2023-03-03 LAB — MAGNESIUM: Magnesium: 2 mg/dL (ref 1.7–2.4)

## 2023-03-03 LAB — CULTURE, BLOOD (ROUTINE X 2): Special Requests: ADEQUATE

## 2023-03-03 MED ORDER — GUAIFENESIN-DM 100-10 MG/5ML PO SYRP: 5.0000 mL | ORAL_SOLUTION | ORAL | 0 refills | Status: DC | PRN

## 2023-03-03 MED ORDER — CEFUROXIME AXETIL 250 MG PO TABS: 250.0000 mg | ORAL_TABLET | Freq: Two times a day (BID) | ORAL | 0 refills | Status: AC

## 2023-03-03 NOTE — Progress Notes (Signed)
Mobility Specialist - Progress Note   03/03/23 1000  Mobility  Activity Ambulated with assistance in hallway  Level of Assistance Contact guard assist, steadying assist  Assistive Device Front wheel walker  Distance Ambulated (ft) 80 ft  Activity Response Tolerated well  Mobility Referral Yes  $Mobility charge 1 Mobility   Pt sitting in recliner on RA upon arrival. Pt STS indep and ambulates in hallway SBA-CGA. Pt becomes SOB during ambulation and heart rate spikes to 128. RN notified. Pt left in recliner with needs in reach and daughter present.   Jocelyn Elliott  Mobility Specialist  03/03/23 10:02 AM

## 2023-03-03 NOTE — Discharge Summary (Signed)
Physician Discharge Summary   Jocelyn Elliott  female DOB: Jun 25, 1932  WUJ:[id number]  PCP: Dale [place], MD  Admit date: 02/28/2023 Discharge date: 03/03/2023  Admitted From: home Disposition:  home Daughter updated at bedside prior to discharge. CODE STATUS: Full code  Discharge Instructions     Discharge instructions   Complete by: As directed    You have Parainfluenza Virus 3 respiratory infection.  But you have also received 3 days of antibiotics for possible pneumonia.  Please finish 2 more days of oral antibiotic Ceftin starting tonight.   Dr. Darlin Priestly [facility] Course:  For full details, please see H&P, progress notes, consult notes and ancillary notes.  Briefly,  Jocelyn Elliott is a 89 y.o. female with medical history significant for CAD, DM, iron deficiency anemia followed by oncology, CKD 3b, HTN, who presented via EMS with shortness of breath.    Patient has had a sinus infection and congestion for the past few weeks and completed a course of Z-Pak about a week prior.  Over the past several days she has had increased cough and congestion as well as wheezing and dyspnea on exertion.  She went to see her PCP who directed her to to the ED for further workup.    Respiratory distress Parainfluenza Virus 3 infection --cough and congestion, likely due to URI. --covid neg.  RVP obtained, returned positive for Parainfluenza Virus 3  --supportive care --received Mucomyst Neb and DuoNeb for mucus clearance.   --symptoms improved prior to discharge.   * CAP (community acquired pneumonia) --CXR showed "Right basilar patchy opacities, suspicious for pneumonia."  CXR personally reviewed, opacities not significant, and could also be from viral PNA, but given pt has been sick for weeks, continued treatment as CAP as ceftriaxone and azithromycin --pt is discharged with 2 more days of oral Ceftin to complete a 5-day course of abx.   Sepsis, ruled out   Stage  3b chronic kidney disease Renal function at baseline   IDA (iron deficiency anemia) Hemoglobin at baseline Continue ferrous sulfate   Diabetes mellitus --A1c 5.1. --d/c'ed BG checks and SSI   Hx of Hypertension, not currently active Soft blood pressure Not currently on antihypertensives   CAD (coronary artery disease) Stable Continue aspirin and rosuvastatin   Unless noted above, medications under "STOP" list are ones pt was not taking PTA.  Discharge Diagnoses:  Principal Problem:   CAP (community acquired pneumonia) Active Problems:   CAD (coronary artery disease)   Hypertension   Diabetes mellitus   IDA (iron deficiency anemia)   Stage 3b chronic kidney disease   Sepsis   30 Day Unplanned Readmission Risk Score    Flowsheet Row ED to Hosp-Admission (Current) from 02/28/2023 in [facility] (1A)  30 Day Unplanned Readmission Risk Score (%) 13.23 Filed at 03/03/2023 0801       This score is the patient's risk of an unplanned readmission within 30 days of being discharged (0 -100%). The score is based on dignosis, age, lab data, medications, orders, and past utilization.   Low:  0-14.9   Medium: 15-21.9   High: 22-29.9   Extreme: 30 and above         Discharge Instructions:  Allergies as of 03/03/2023       Reactions   Propofol Other (See Comments)   Caused heart to pause   Ciprofloxacin Rash   Levaquin [levofloxacin] Rash   Tequin [gatifloxacin] Hives, Rash  Medication List     STOP taking these medications    blood glucose meter kit and supplies Kit   finasteride 5 MG tablet Commonly known as: PROSCAR   OneTouch Delica Plus Lancet30G Misc   OneTouch Ultra test strip Generic drug: glucose blood       TAKE these medications    aspirin 81 MG tablet Take 81 mg by mouth daily.   Biotin 5000 MCG Caps Take 1 capsule by mouth daily.   cefUROXime 250 MG tablet Commonly known as: CEFTIN Take 1  tablet (250 mg total) by mouth 2 (two) times daily with a meal for 2 days.   cetirizine 10 MG tablet Commonly known as: ZYRTEC Take 10 mg by mouth daily.   docusate sodium 100 MG capsule Commonly known as: COLACE Take 100 mg by mouth daily as needed for mild constipation.   ferrous sulfate 325 (65 FE) MG EC tablet Take 325 mg by mouth every other day. Every other day   Fish Oil 1200 MG Caps Take 1 capsule by mouth daily.   guaiFENesin-dextromethorphan 100-10 MG/5ML syrup Commonly known as: ROBITUSSIN DM Take 5 mLs by mouth every 4 (four) hours as needed for cough.   Probiotic (Lactobacillus) Caps Take 1 capsule by mouth daily.   rosuvastatin 40 MG tablet Commonly known as: CRESTOR TAKE 1 TABLET BY MOUTH EVERY DAY   traZODone 50 MG tablet Commonly known as: DESYREL TAKE 1/2 TO 1 TABLET BY MOUTH AT BEDTIME AS NEEDED FOR SLEEP What changed: reasons to take this         Follow-up Information     Dale [place], MD Follow up in 1 week(s).   Specialty: Internal Medicine Contact information: [address] [place] [address] [phone]                 Allergies  Allergen Reactions   Propofol Other (See Comments)    Caused heart to pause   Ciprofloxacin Rash   Levaquin [Levofloxacin] Rash   Tequin [Gatifloxacin] Hives and Rash     The results of significant diagnostics from this hospitalization (including imaging, microbiology, ancillary and laboratory) are listed below for reference.   Consultations:   Procedures/Studies: DG Chest 2 View  Result Date: 02/28/2023 CLINICAL DATA:  Shortness of breath EXAM: CHEST - 2 VIEW COMPARISON:  Chest radiograph dated 02/04/2019 FINDINGS: Normal lung volumes. Right basilar patchy opacities. No pleural effusion or pneumothorax. The heart size and mediastinal contours are within normal limits. Partially imaged lumbar spinal fixation hardware appears intact. IMPRESSION: Right basilar patchy  opacities, suspicious for pneumonia. Electronically Signed   By: Agustin Cree M.D.   On: 02/28/2023 18:53      Labs: BNP (last 3 results) No results for input(s): "BNP" in the last 8760 hours. Basic Metabolic Panel: Recent Labs  Lab 02/28/23 1811 03/02/23 0435 03/03/23 0413  NA 129* 136 135  K 3.5 3.0* 3.5  CL 99 104 104  CO2 21* 22 22  GLUCOSE 137* 107* 123*  BUN 21 18 20   CREATININE 1.21* 1.13* 1.12*  CALCIUM 8.4* 8.2* 8.2*  MG  --  2.0 2.0   Liver Function Tests: No results for input(s): "AST", "ALT", "ALKPHOS", "BILITOT", "PROT", "ALBUMIN" in the last 168 hours. No results for input(s): "LIPASE", "AMYLASE" in the last 168 hours. No results for input(s): "AMMONIA" in the last 168 hours. CBC: Recent Labs  Lab 02/28/23 1811 02/28/23 2318 03/02/23 0435 03/03/23 0413  WBC 10.2 11.8* 10.2 8.8  NEUTROABS  --  10.0*  --   --   HGB 10.7* 9.9* 9.3* 8.9*  HCT 33.3* 30.8* 29.3* 27.7*  MCV 100.9* 101.7* 102.1* 100.7*  PLT 238 232 229 239   Cardiac Enzymes: No results for input(s): "CKTOTAL", "CKMB", "CKMBINDEX", "TROPONINI" in the last 168 hours. BNP: Invalid input(s): "POCBNP" CBG: Recent Labs  Lab 03/01/23 0202 03/01/23 0848 03/01/23 1204 03/01/23 1717 03/01/23 2109  GLUCAP 120* 86 135* 104* 136*   D-Dimer No results for input(s): "DDIMER" in the last 72 hours. Hgb A1c No results for input(s): "HGBA1C" in the last 72 hours. Lipid Profile No results for input(s): "CHOL", "HDL", "LDLCALC", "TRIG", "CHOLHDL", "LDLDIRECT" in the last 72 hours. Thyroid function studies No results for input(s): "TSH", "T4TOTAL", "T3FREE", "THYROIDAB" in the last 72 hours.  Invalid input(s): "FREET3" Anemia work up No results for input(s): "VITAMINB12", "FOLATE", "FERRITIN", "TIBC", "IRON", "RETICCTPCT" in the last 72 hours. Urinalysis    Component Value Date/Time   COLORURINE YELLOW (A) 02/28/2023 2318   APPEARANCEUR HAZY (A) 02/28/2023 2318   LABSPEC 1.013 02/28/2023 2318    PHURINE 6.0 02/28/2023 2318   GLUCOSEU NEGATIVE 02/28/2023 2318   GLUCOSEU NEGATIVE 06/22/2015 1331   HGBUR LARGE (A) 02/28/2023 2318   BILIRUBINUR NEGATIVE 02/28/2023 2318   BILIRUBINUR negative 10/13/2018 1116   BILIRUBINUR neg 05/19/2014 0811   KETONESUR NEGATIVE 02/28/2023 2318   PROTEINUR >=300 (A) 02/28/2023 2318   UROBILINOGEN 0.2 10/13/2018 1116   UROBILINOGEN 0.2 06/22/2015 1331   NITRITE NEGATIVE 02/28/2023 2318   LEUKOCYTESUR TRACE (A) 02/28/2023 2318   Sepsis Labs Recent Labs  Lab 02/28/23 1811 02/28/23 2318 03/02/23 0435 03/03/23 0413  WBC 10.2 11.8* 10.2 8.8   Microbiology Recent Results (from the past 240 hour(s))  Resp panel by RT-PCR (RSV, Flu A&B, Covid) Anterior Nasal Swab     Status: None   Collection Time: 02/28/23 11:18 PM   Specimen: Anterior Nasal Swab  Result Value Ref Range Status   SARS Coronavirus 2 by RT PCR NEGATIVE NEGATIVE Final    Comment: (NOTE) SARS-CoV-2 target nucleic acids are NOT DETECTED.  The SARS-CoV-2 RNA is generally detectable in upper respiratory specimens during the acute phase of infection. The lowest concentration of SARS-CoV-2 viral copies this assay can detect is 138 copies/mL. A negative result does not preclude SARS-Cov-2 infection and should not be used as the sole basis for treatment or other patient management decisions. A negative result may occur with  improper specimen collection/handling, submission of specimen other than nasopharyngeal swab, presence of viral mutation(s) within the areas targeted by this assay, and inadequate number of viral copies(<138 copies/mL). A negative result must be combined with clinical observations, patient history, and epidemiological information. The expected result is Negative.  Fact Sheet for Patients:  BloggerCourse.com  Fact Sheet for Healthcare Providers:  SeriousBroker.it  This test is no t yet approved or cleared by the  [place] FDA and  has been authorized for detection and/or diagnosis of SARS-CoV-2 by FDA under an Emergency Use Authorization (EUA). This EUA will remain  in effect (meaning this test can be used) for the duration of the COVID-19 declaration under Section 564(b)(1) of the Act, 21 U.S.C.section 360bbb-3(b)(1), unless the authorization is terminated  or revoked sooner.       Influenza A by PCR NEGATIVE NEGATIVE Final   Influenza B by PCR NEGATIVE NEGATIVE Final    Comment: (NOTE) The Xpert Xpress SARS-CoV-2/FLU/RSV plus assay is intended as an aid in the diagnosis of influenza from Nasopharyngeal swab specimens and  should not be used as a sole basis for treatment. Nasal washings and aspirates are unacceptable for Xpert Xpress SARS-CoV-2/FLU/RSV testing.  Fact Sheet for Patients: BloggerCourse.com  Fact Sheet for Healthcare Providers: SeriousBroker.it  This test is not yet approved or cleared by the [place] FDA and has been authorized for detection and/or diagnosis of SARS-CoV-2 by FDA under an Emergency Use Authorization (EUA). This EUA will remain in effect (meaning this test can be used) for the duration of the COVID-19 declaration under Section 564(b)(1) of the Act, 21 U.S.C. section 360bbb-3(b)(1), unless the authorization is terminated or revoked.     Resp Syncytial Virus by PCR NEGATIVE NEGATIVE Final    Comment: (NOTE) Fact Sheet for Patients: BloggerCourse.com  Fact Sheet for Healthcare Providers: SeriousBroker.it  This test is not yet approved or cleared by the [place] FDA and has been authorized for detection and/or diagnosis of SARS-CoV-2 by FDA under an Emergency Use Authorization (EUA). This EUA will remain in effect (meaning this test can be used) for the duration of the COVID-19 declaration under Section 564(b)(1) of the Act, 21 U.S.C. section  360bbb-3(b)(1), unless the authorization is terminated or revoked.  Performed at [facility], [address] [place], [place] [address]   Blood Culture (routine x 2)     Status: None (Preliminary result)   Collection Time: 02/28/23 11:18 PM   Specimen: BLOOD  Result Value Ref Range Status   Specimen Description BLOOD BLOOD RIGHT ARM  Final   Special Requests   Final    BOTTLES DRAWN AEROBIC AND ANAEROBIC Blood Culture adequate volume   Culture   Final    NO GROWTH 3 DAYS Performed at [facility], [address], [place], [place] [address]    Report Status PENDING  Incomplete  Blood Culture (routine x 2)     Status: None (Preliminary result)   Collection Time: 02/28/23 11:18 PM   Specimen: BLOOD  Result Value Ref Range Status   Specimen Description BLOOD BLOOD RIGHT ARM  Final   Special Requests   Final    BOTTLES DRAWN AEROBIC AND ANAEROBIC Blood Culture adequate volume   Culture   Final    NO GROWTH 2 DAYS Performed at [facility], [address] [place], [place] [address]    Report Status PENDING  Incomplete  Respiratory (~20 pathogens) panel by PCR     Status: Abnormal   Collection Time: 03/01/23  9:26 AM   Specimen: Nasopharyngeal Swab; Respiratory  Result Value Ref Range Status   Adenovirus NOT DETECTED NOT DETECTED Final   Coronavirus 229E NOT DETECTED NOT DETECTED Final    Comment: (NOTE) The Coronavirus on the Respiratory Panel, DOES NOT test for the novel  Coronavirus (2019 nCoV)    Coronavirus HKU1 NOT DETECTED NOT DETECTED Final   Coronavirus NL63 NOT DETECTED NOT DETECTED Final   Coronavirus OC43 NOT DETECTED NOT DETECTED Final   Metapneumovirus NOT DETECTED NOT DETECTED Final   Rhinovirus / Enterovirus NOT DETECTED NOT DETECTED Final   Influenza A NOT DETECTED NOT DETECTED Final   Influenza B NOT DETECTED NOT DETECTED Final   Parainfluenza Virus 1 NOT DETECTED NOT DETECTED Final   Parainfluenza Virus 2 NOT DETECTED NOT  DETECTED Final   Parainfluenza Virus 3 DETECTED (A) NOT DETECTED Final   Parainfluenza Virus 4 NOT DETECTED NOT DETECTED Final   Respiratory Syncytial Virus NOT DETECTED NOT DETECTED Final   Bordetella pertussis NOT DETECTED NOT DETECTED Final   Bordetella Parapertussis NOT  DETECTED NOT DETECTED Final   Chlamydophila pneumoniae NOT DETECTED NOT DETECTED Final   Mycoplasma pneumoniae NOT DETECTED NOT DETECTED Final    Comment: Performed at [facility], [address], [place], [place] [address]     Total time spend on discharging this patient, including the last patient exam, discussing the hospital stay, instructions for ongoing care as it relates to all pertinent caregivers, as well as preparing the medical discharge records, prescriptions, and/or referrals as applicable, is 35 minutes.    Darlin Priestly, MD  [facility] 03/03/2023, 8:11 AM

## 2023-03-03 NOTE — TOC Transition Note (Signed)
Transition of Care [facility]) - CM/SW Discharge Note   Patient Details  Name: Jocelyn Elliott MRN: [id number] Date of Birth: [birth year]  [facility] CM/SW Contact:  Susa Simmonds, LCSWA Phone Number: 03/03/2023, 9:44 AM   Clinical Narrative:  CSW contacted patients daughter Sharyne Richters, [phone] to discuss home health options. CSW explained the difficulty with patients insurance. CSW told patients daughter if she is able to find an agency to accept patients insurance she will let her know. CSW did advise patients daughter to speak with patients PCP about an outpatient PT referral.     Final next level of care:  (Pending Medical work up) Barriers to Discharge: No Barriers Identified   Patient Goals and CMS Choice      Discharge Placement                         Discharge Plan and Services Additional resources added to the After Visit Summary for                                       Social Determinants of Health (SDOH) Interventions SDOH Screenings   Food Insecurity: No Food Insecurity (03/01/2023)  Housing: Low Risk  (03/01/2023)  Transportation Needs: No Transportation Needs (03/01/2023)  Utilities: Not At Risk (03/01/2023)  Depression (PHQ2-9): Low Risk  (03/27/2022)  Financial Resource Strain: Low Risk  (03/27/2022)  Social Connections: Unknown (03/27/2022)  Stress: No Stress Concern Present (03/27/2022)  Tobacco Use: Medium Risk (03/01/2023)     Readmission Risk Interventions     No data to display

## 2023-03-04 ENCOUNTER — Telehealth: Payer: Self-pay

## 2023-03-04 ENCOUNTER — Ambulatory Visit: Payer: Medicare Other | Admitting: Dermatology

## 2023-03-04 NOTE — Telephone Encounter (Signed)
Pt scheduled for HFU next week. Declined appt for this week.

## 2023-03-04 NOTE — Transitions of Care (Post Inpatient/ED Visit) (Signed)
   03/04/2023  Name: Jocelyn Elliott MRN: [id number] DOB: [birth year]  Today's TOC FU Call Status: Today's TOC FU Call Status:: Successful TOC FU Call Competed TOC FU Call Complete Date: 03/04/23  Transition Care Management Follow-up Telephone Call Date of Discharge: 03/03/23 Discharge Facility: [facility]) Type of Discharge: Inpatient Admission Primary Inpatient Discharge Diagnosis:: pneumonia How have you been since you were released from the hospital?: Better Any questions or concerns?: No  Items Reviewed: Did you receive and understand the discharge instructions provided?: Yes Medications obtained and verified?: Yes (Medications Reviewed) Any new allergies since your discharge?: No Dietary orders reviewed?: Yes Do you have support at home?: Yes People in Home: child(ren), adult  Home Care and Equipment/Supplies: Were Home Health Services Ordered?: NA Any new equipment or medical supplies ordered?: NA  Functional Questionnaire: Do you need assistance with bathing/showering or dressing?: No Do you need assistance with meal preparation?: No Do you need assistance with eating?: No Do you have difficulty maintaining continence: No Do you need assistance with getting out of bed/getting out of a chair/moving?: No Do you have difficulty managing or taking your medications?: No  Follow up appointments reviewed: PCP Follow-up appointment confirmed?: No MD Provider Line [phone] Given:  (daughter [name] will call and schedule) Specialist Hospital Follow-up appointment confirmed?: NA Do you need transportation to your follow-up appointment?: No Do you understand care options if your condition(s) worsen?: Yes-patient verbalized understanding    SIGNATURE Karena Addison, LPN [facility] Nurse Health Advisor Direct Dial [phone]

## 2023-03-04 NOTE — Telephone Encounter (Signed)
Reviewed. Per note, doing better. Please schedule hospital follow.  It looks like she was given a different number to call and schedule a f/u apt.  Needs hospital f/u within the next 7-14 days.

## 2023-03-05 ENCOUNTER — Inpatient Hospital Stay: Payer: Medicare Other | Admitting: Internal Medicine

## 2023-03-05 ENCOUNTER — Inpatient Hospital Stay: Payer: Medicare Other

## 2023-03-05 LAB — CULTURE, BLOOD (ROUTINE X 2)
Culture: NO GROWTH
Special Requests: ADEQUATE

## 2023-03-05 MED FILL — Ferumoxytol Inj 510 MG/17ML (30 MG/ML) (Elemental Fe): INTRAVENOUS | Qty: 17 | Status: AC

## 2023-03-11 ENCOUNTER — Ambulatory Visit: Payer: Medicare Other | Admitting: Internal Medicine

## 2023-03-11 ENCOUNTER — Encounter: Payer: Self-pay | Admitting: Internal Medicine

## 2023-03-11 VITALS — BP 128/70 | HR 69 | Temp 98.0°F | Resp 16 | Ht 59.0 in | Wt 137.8 lb

## 2023-03-11 DIAGNOSIS — N1832 Chronic kidney disease, stage 3b: Secondary | ICD-10-CM

## 2023-03-11 DIAGNOSIS — I2510 Atherosclerotic heart disease of native coronary artery without angina pectoris: Secondary | ICD-10-CM

## 2023-03-11 DIAGNOSIS — R531 Weakness: Secondary | ICD-10-CM

## 2023-03-11 DIAGNOSIS — D696 Thrombocytopenia, unspecified: Secondary | ICD-10-CM | POA: Diagnosis not present

## 2023-03-11 DIAGNOSIS — I700 Atherosclerosis of aorta: Secondary | ICD-10-CM

## 2023-03-11 DIAGNOSIS — R21 Rash and other nonspecific skin eruption: Secondary | ICD-10-CM | POA: Diagnosis not present

## 2023-03-11 DIAGNOSIS — E1159 Type 2 diabetes mellitus with other circulatory complications: Secondary | ICD-10-CM

## 2023-03-11 DIAGNOSIS — I4891 Unspecified atrial fibrillation: Secondary | ICD-10-CM

## 2023-03-11 DIAGNOSIS — D509 Iron deficiency anemia, unspecified: Secondary | ICD-10-CM

## 2023-03-11 DIAGNOSIS — E7800 Pure hypercholesterolemia, unspecified: Secondary | ICD-10-CM

## 2023-03-11 DIAGNOSIS — J988 Other specified respiratory disorders: Secondary | ICD-10-CM | POA: Diagnosis not present

## 2023-03-11 DIAGNOSIS — J189 Pneumonia, unspecified organism: Secondary | ICD-10-CM

## 2023-03-11 DIAGNOSIS — M25552 Pain in left hip: Secondary | ICD-10-CM

## 2023-03-11 DIAGNOSIS — I779 Disorder of arteries and arterioles, unspecified: Secondary | ICD-10-CM

## 2023-03-11 NOTE — Progress Notes (Addendum)
Subjective:    Patient ID: Jocelyn Elliott, female    DOB: March 18, 1932, 89 y.o.   MRN: [id number]  Patient here for  Chief Complaint  Patient presents with   Hospitalization Follow-up    HPI Here for hospital follow up. Admitted 02/28/23 - 03/03/23 - with sob, cough and congestion.  Diagnosed with parainfluenza virus 3 respiratory infection.  Received mucomyst neb and duoneb for mucus clearance.  CXR with right basilar patchy opacities - suspicious for pneumonia.  Question if opacities could be from viral pneumonia.  Was treated for CAP with ceftriaxone and azithromycin.  Completed oral ceftin after discharge. Since being home she is doing better.  Breathing is getting better. Cough and congestion getting better.  Using incentive spirometer. Starting to move around more, but still weak. Also increased pain - left hip.  Discussed home health PT.  Agreeable.  Bowels moving - prune juice.  Has f/u next week with hematology.  Will get f/u labs then.      Past Medical History:  Diagnosis Date   Arthritis    AV malformation of gastrointestinal tract    Blood in stool    CAD (coronary artery disease)    Carotid arterial disease (HCC)    Cataracts, bilateral    Chronic blood loss anemia    Chronic cystitis    CKD (chronic kidney disease), stage III (HCC)    Complication of anesthesia    reaction to propofol - caused heart to pause   Depression    Diabetes mellitus (HCC)    GERD (gastroesophageal reflux disease)    Hyperlipidemia    Hypertension    IDA (iron deficiency anemia)    Stress incontinence    TIA (transient ischemic attack) 03/2021   No deficits   Wears dentures    full upper and lower   Past Surgical History:  Procedure Laterality Date   ABDOMINAL HYSTERECTOMY  11/19/1970   ovaries left in place   APPENDECTOMY  11/19/1990   BACK SURGERY  06/08/2010   L3, L4, L5   CARDIAC CATHETERIZATION  02/2001   CAROTID ARTERY ANGIOPLASTY  09/19/2002   CARPAL TUNNEL RELEASE  1991,  92   right then left    CATARACT EXTRACTION W/PHACO Left 02/13/2022   Procedure: CATARACT EXTRACTION PHACO AND INTRAOCULAR LENS PLACEMENT (IOC) LEFT DIABETIC 14.90 01:26.8;  Surgeon: Galen Manila, MD;  Location: [facility];  Service: Ophthalmology;  Laterality: Left;  Diabetic   CATARACT EXTRACTION W/PHACO Right 03/06/2022   Procedure: CATARACT EXTRACTION PHACO AND INTRAOCULAR LENS PLACEMENT (IOC) RIGHT DIABETIC 9.66 00:53.7;  Surgeon: Galen Manila, MD;  Location: [facility];  Service: Ophthalmology;  Laterality: Right;  Diabetic   CHOLECYSTECTOMY  11/20/1991   COLONOSCOPY WITH PROPOFOL N/A 02/02/2019   Procedure: COLONOSCOPY WITH PROPOFOL;  Surgeon: Scot Jun, MD;  Location: [facility];  Service: Endoscopy;  Laterality: N/A;   ESOPHAGOGASTRODUODENOSCOPY N/A 02/02/2019   Procedure: ESOPHAGOGASTRODUODENOSCOPY (EGD);  Surgeon: Scot Jun, MD;  Location: [facility];  Service: Endoscopy;  Laterality: N/A;   FOOT SURGERY  06/20/2007   Left   right carotid artery surgery  01/09/2013   Dr. Gilda Crease @ [facility]   TOTAL HIP ARTHROPLASTY  05/03/2011   left 12, right 11/07   Family History  Problem Relation Age of Onset   Hyperlipidemia Father    Heart disease Father        myocardial infarction   Hypertension Father        Parent   Arthritis Other  parent   Diabetes Other        nephew   Cervical cancer Sister    Rectal cancer Sister    Breast cancer Neg Hx    Social History   Socioeconomic History   Marital status: Widowed    Spouse name: Not on file   Number of children: 1   Years of education: 34   Highest education level: Not on file  Occupational History   Occupation: Retired  Tobacco Use   Smoking status: Former    Types: Cigarettes    Quit date: 11/19/1989    Years since quitting: 33.3   Smokeless tobacco: Never  Vaping Use   Vaping Use: Never used  Substance and Sexual Activity   Alcohol use: No    Alcohol/week: 0.0  standard drinks of alcohol   Drug use: No   Sexual activity: Not Currently  Other Topics Concern   Not on file  Social History Narrative   Regular exercise-no   Caffeine Use-yes   Social Determinants of Health   Financial Resource Strain: Low Risk  (03/27/2022)   Overall Financial Resource Strain (CARDIA)    Difficulty of Paying Living Expenses: Not hard at all  Food Insecurity: No Food Insecurity (03/01/2023)   Hunger Vital Sign    Worried About Running Out of Food in the Last Year: Never true    Ran Out of Food in the Last Year: Never true  Transportation Needs: No Transportation Needs (03/01/2023)   PRAPARE - Administrator, Civil Service (Medical): No    Lack of Transportation (Non-Medical): No  Physical Activity: Not on file  Stress: No Stress Concern Present (03/27/2022)   [employer] of Occupational Health - Occupational Stress Questionnaire    Feeling of Stress : Not at all  Social Connections: Unknown (03/27/2022)   Social Connection and Isolation Panel [NHANES]    Frequency of Communication with Friends and Family: More than three times a week    Frequency of Social Gatherings with Friends and Family: Three times a week    Attends Religious Services: Not on file    Active Member of Clubs or Organizations: Not on file    Attends Club or Organization Meetings: Not on file    Marital Status: Widowed     Review of Systems  Constitutional:  Positive for fatigue. Negative for appetite change and unexpected weight change.  HENT:  Negative for congestion and sinus pressure.   Respiratory:  Positive for cough. Negative for chest tightness.   Cardiovascular:  Negative for chest pain and palpitations.       Leg swelling is better.   Gastrointestinal:  Negative for abdominal pain, nausea and vomiting.  Genitourinary:  Negative for difficulty urinating and dysuria.  Musculoskeletal:  Negative for joint swelling and myalgias.  Skin:  Negative for color change and  rash.  Neurological:  Negative for dizziness and headaches.  Psychiatric/Behavioral:  Negative for agitation and dysphoric mood.        Objective:     BP 128/70   Pulse 69   Temp 98 F (36.7 C)   Resp 16   Ht 4\' 11"  (1.499 m)   Wt 137 lb 12.8 oz (62.5 kg)   SpO2 98%   BMI 27.83 kg/m  Wt Readings from Last 3 Encounters:  03/11/23 137 lb 12.8 oz (62.5 kg)  02/28/23 141 lb (64 kg)  02/21/23 141 lb (64 kg)    Physical Exam Vitals reviewed.  Constitutional:  General: She is not in acute distress.    Appearance: Normal appearance.  HENT:     Head: Normocephalic and atraumatic.     Right Ear: External ear normal.     Left Ear: External ear normal.     Mouth/Throat:     Pharynx: No oropharyngeal exudate or posterior oropharyngeal erythema.  Eyes:     General: No scleral icterus.       Right eye: No discharge.        Left eye: No discharge.     Conjunctiva/sclera: Conjunctivae normal.  Neck:     Thyroid: No thyromegaly.  Cardiovascular:     Rate and Rhythm: Normal rate and regular rhythm.  Pulmonary:     Effort: No respiratory distress.     Breath sounds: Normal breath sounds. No wheezing.  Abdominal:     General: Bowel sounds are normal.     Palpations: Abdomen is soft.     Tenderness: There is no abdominal tenderness.  Musculoskeletal:        General: No swelling or tenderness.     Cervical back: Neck supple. No tenderness.  Lymphadenopathy:     Cervical: No cervical adenopathy.  Skin:    Findings: No erythema.     Comments: Circular rash - right leg.   Neurological:     Mental Status: She is alert.  Psychiatric:        Mood and Affect: Mood normal.        Behavior: Behavior normal.      Outpatient Encounter Medications as of 03/11/2023  Medication Sig   triamcinolone cream (KENALOG) 0.1 % Apply 1 Application topically 2 (two) times daily. On affected area on leg.  Use for 7-10 days only - then stop.   aspirin 81 MG tablet Take 81 mg by mouth daily.    Biotin 5000 MCG CAPS Take 1 capsule by mouth daily.   cetirizine (ZYRTEC) 10 MG tablet Take 10 mg by mouth daily.   docusate sodium (COLACE) 100 MG capsule Take 100 mg by mouth daily as needed for mild constipation.   ferrous sulfate 325 (65 FE) MG EC tablet Take 325 mg by mouth every other day. Every other day   guaiFENesin-dextromethorphan (ROBITUSSIN DM) 100-10 MG/5ML syrup Take 5 mLs by mouth every 4 (four) hours as needed for cough.   Omega-3 Fatty Acids (FISH OIL) 1200 MG CAPS Take 1 capsule by mouth daily.   Probiotic, Lactobacillus, CAPS Take 1 capsule by mouth daily.   rosuvastatin (CRESTOR) 40 MG tablet TAKE 1 TABLET BY MOUTH EVERY DAY (Patient taking differently: Take 40 mg by mouth daily.)   traZODone (DESYREL) 50 MG tablet TAKE 1/2 TO 1 TABLET BY MOUTH AT BEDTIME AS NEEDED FOR SLEEP (Patient taking differently: Take 25-50 mg by mouth at bedtime as needed for sleep. for sleep)   No facility-administered encounter medications on file as of 03/11/2023.    Reviewed/reconciled medications with patient.   Lab Results  Component Value Date   WBC 8.8 03/03/2023   HGB 8.9 (L) 03/03/2023   HCT 27.7 (L) 03/03/2023   PLT 239 03/03/2023   GLUCOSE 123 (H) 03/03/2023   CHOL 145 12/24/2022   TRIG 118.0 12/24/2022   HDL 69.60 12/24/2022   LDLDIRECT 51.0 04/14/2019   LDLCALC 52 12/24/2022   ALT 28 12/24/2022   AST 36 12/24/2022   NA 135 03/03/2023   K 3.5 03/03/2023   CL 104 03/03/2023   CREATININE 1.12 (H) 03/03/2023   BUN 20 03/03/2023  CO2 22 03/03/2023   TSH 1.82 07/16/2022   INR 1.2 02/28/2023   HGBA1C 5.1 12/24/2022   MICROALBUR 12.7 (H) 07/16/2022    DG Chest 2 View  Result Date: 02/28/2023 CLINICAL DATA:  Shortness of breath EXAM: CHEST - 2 VIEW COMPARISON:  Chest radiograph dated 02/04/2019 FINDINGS: Normal lung volumes. Right basilar patchy opacities. No pleural effusion or pneumothorax. The heart size and mediastinal contours are within normal limits. Partially  imaged lumbar spinal fixation hardware appears intact. IMPRESSION: Right basilar patchy opacities, suspicious for pneumonia. Electronically Signed   By: Agustin Cree M.D.   On: 02/28/2023 18:53       Assessment & Plan:  Respiratory infection Assessment & Plan: Admitted 02/28/23 - 03/03/23 - with sob, cough and congestion.  Diagnosed with parainfluenza virus 3 respiratory infection.  Received mucomyst neb and duoneb for mucus clearance.  CXR with right basilar patchy opacities - suspicious for pneumonia.  Question if opacities could be from viral pneumonia.  Was treated for CAP with ceftriaxone and azithromycin.  Completed oral ceftin after discharge. Since being home she is doing better.  Breathing is getting better. Cough and congestion getting better.  Using incentive spirometer. Continue incentive spirometer.  Discussed PT as outlined.  Agreeable for home health PT.    Thrombocytopenia (HCC) Assessment & Plan: Follow cbc.  Being followed by hematology.    Stage 3b chronic kidney disease (HCC) Assessment & Plan: Continue to avoid antiinflammatories.  Stay hydrated.  Follow metabolic panel. Off ace inhibitor due to low blood pressure. Will have metabolic panel checked through hematology next week.    Rash Assessment & Plan: Circular rash - trial of triamcinolone cream.     Pain of left hip Assessment & Plan: Left hip pain.  Weakness from recent illness and hospitalization.  Discussed home health PT.  Agreeable.   Orders: -     Ambulatory referral to Home Health  Iron deficiency anemia, unspecified iron deficiency anemia type Assessment & Plan: Has been followed by hematology - intermittent iron infusions.  Has f/u with hematology next week.  Recheck cbc then.    Hypercholesteremia Assessment & Plan: Continue crestor.  Low cholesterol diet and exercise.  Follow lipid panel and liver function tests.     Type 2 diabetes mellitus with other circulatory complication, without  long-term current use of insulin (HCC) Assessment & Plan: Sugars have been under reasonable control.  Follow.    Carotid artery disease, unspecified laterality, unspecified type (HCC) Assessment & Plan:  Continue antiplatelet therapy as prescribed Continue management of CAD, HTN and Hyperlipidemia Evaluated [facility] 08/20/22 -ultrasound shows RICA 1-39% and LICA 40-59% stenosis bilaterally s/p left CEA.  Recommended f/u in 12 months.     Community acquired pneumonia, unspecified laterality Assessment & Plan: Admitted 02/28/23 - 03/03/23 - with sob, cough and congestion.  Diagnosed with parainfluenza virus 3 respiratory infection.  Received mucomyst neb and duoneb for mucus clearance.  CXR with right basilar patchy opacities - suspicious for pneumonia.  Question if opacities could be from viral pneumonia.  Was treated for CAP with ceftriaxone and azithromycin.  Completed oral ceftin after discharge. Since being home she is doing better.  Breathing is getting better. Cough and congestion getting better.  Using incentive spirometer. Will need f/u cxr. Plan for 6 week cxr f/u.    Coronary artery disease involving native coronary artery of native heart without angina pectoris Assessment & Plan: Off imdur due to low blood pressure.  Remains on crestor.  Currently without  symptoms.    Aortic atherosclerosis (HCC) Assessment & Plan: Continue crestor.      Atrial fibrillation, unspecified type [facility]) Assessment & Plan: Continues on aspirin. Heart rate controlled. Appears to be in SR.    Weakness Assessment & Plan: pt with recent hospitalization for pneumonia/para influenza. with perisistent weakness and left hip pain. needs PT to evaluated and treat   Orders: -     Ambulatory referral to Home Health  Other orders -     Triamcinolone Acetonide; Apply 1 Application topically 2 (two) times daily. On affected area on leg.  Use for 7-10 days only - then stop.  Dispense: 30 g; Refill:  0     Dale Williams, MD

## 2023-03-18 ENCOUNTER — Encounter: Payer: Self-pay | Admitting: Internal Medicine

## 2023-03-18 ENCOUNTER — Inpatient Hospital Stay: Payer: Medicare Other

## 2023-03-18 ENCOUNTER — Inpatient Hospital Stay: Payer: Medicare Other | Attending: Internal Medicine

## 2023-03-18 ENCOUNTER — Inpatient Hospital Stay (HOSPITAL_BASED_OUTPATIENT_CLINIC_OR_DEPARTMENT_OTHER): Payer: Medicare Other | Admitting: Internal Medicine

## 2023-03-18 VITALS — BP 118/49 | HR 80 | Temp 98.0°F | Ht 59.0 in | Wt 139.4 lb

## 2023-03-18 DIAGNOSIS — Z7902 Long term (current) use of antithrombotics/antiplatelets: Secondary | ICD-10-CM | POA: Diagnosis not present

## 2023-03-18 DIAGNOSIS — R531 Weakness: Secondary | ICD-10-CM | POA: Insufficient documentation

## 2023-03-18 DIAGNOSIS — D631 Anemia in chronic kidney disease: Secondary | ICD-10-CM | POA: Diagnosis present

## 2023-03-18 DIAGNOSIS — N183 Chronic kidney disease, stage 3 unspecified: Secondary | ICD-10-CM | POA: Diagnosis present

## 2023-03-18 DIAGNOSIS — G459 Transient cerebral ischemic attack, unspecified: Secondary | ICD-10-CM | POA: Insufficient documentation

## 2023-03-18 DIAGNOSIS — D649 Anemia, unspecified: Secondary | ICD-10-CM

## 2023-03-18 LAB — CBC WITH DIFFERENTIAL (CANCER CENTER ONLY)
Abs Immature Granulocytes: 0.02 10*3/uL (ref 0.00–0.07)
Basophils Absolute: 0.1 10*3/uL (ref 0.0–0.1)
Basophils Relative: 1 %
Eosinophils Absolute: 0.2 10*3/uL (ref 0.0–0.5)
Eosinophils Relative: 3 %
HCT: 28.7 % — ABNORMAL LOW (ref 36.0–46.0)
Hemoglobin: 9 g/dL — ABNORMAL LOW (ref 12.0–15.0)
Immature Granulocytes: 0 %
Lymphocytes Relative: 17 %
Lymphs Abs: 1.1 10*3/uL (ref 0.7–4.0)
MCH: 32.7 pg (ref 26.0–34.0)
MCHC: 31.4 g/dL (ref 30.0–36.0)
MCV: 104.4 fL — ABNORMAL HIGH (ref 80.0–100.0)
Monocytes Absolute: 0.4 10*3/uL (ref 0.1–1.0)
Monocytes Relative: 6 %
Neutro Abs: 4.9 10*3/uL (ref 1.7–7.7)
Neutrophils Relative %: 73 %
Platelet Count: 233 10*3/uL (ref 150–400)
RBC: 2.75 MIL/uL — ABNORMAL LOW (ref 3.87–5.11)
RDW: 15.3 % (ref 11.5–15.5)
WBC Count: 6.7 10*3/uL (ref 4.0–10.5)
nRBC: 0 % (ref 0.0–0.2)

## 2023-03-18 LAB — BASIC METABOLIC PANEL - CANCER CENTER ONLY
Anion gap: 6 (ref 5–15)
BUN: 17 mg/dL (ref 8–23)
CO2: 24 mmol/L (ref 22–32)
Calcium: 8.3 mg/dL — ABNORMAL LOW (ref 8.9–10.3)
Chloride: 104 mmol/L (ref 98–111)
Creatinine: 1.08 mg/dL — ABNORMAL HIGH (ref 0.44–1.00)
GFR, Estimated: 49 mL/min — ABNORMAL LOW (ref >60)
Glucose, Bld: 145 mg/dL — ABNORMAL HIGH (ref 70–99)
Potassium: 3.9 mmol/L (ref 3.5–5.1)
Sodium: 134 mmol/L — ABNORMAL LOW (ref 135–145)

## 2023-03-18 MED ORDER — TRIAMCINOLONE ACETONIDE 0.1 % EX CREA: 1.0000 | TOPICAL_CREAM | Freq: Two times a day (BID) | CUTANEOUS | 0 refills | Status: DC

## 2023-03-18 MED ORDER — EPOETIN ALFA-EPBX 40000 UNIT/ML IJ SOLN
40000.0000 [IU] | Freq: Once | INTRAMUSCULAR | Status: AC
  Administered 2023-03-18: 40000 [IU] via SUBCUTANEOUS
  Filled 2023-03-18: qty 1

## 2023-03-18 MED ORDER — CYANOCOBALAMIN 1000 MCG/ML IJ SOLN
1000.0000 ug | Freq: Once | INTRAMUSCULAR | Status: AC
  Administered 2023-03-18: 1000 ug via INTRAMUSCULAR
  Filled 2023-03-18: qty 1

## 2023-03-18 MED FILL — Ferumoxytol Inj 510 MG/17ML (30 MG/ML) (Elemental Fe): INTRAVENOUS | Qty: 17 | Status: AC

## 2023-03-18 NOTE — Assessment & Plan Note (Signed)
Left hip pain.  Weakness from recent illness and hospitalization.  Discussed home health PT.  Agreeable.

## 2023-03-18 NOTE — Assessment & Plan Note (Signed)
Continues on aspirin. Heart rate controlled. Appears to be in SR.

## 2023-03-18 NOTE — Assessment & Plan Note (Signed)
#  Severe anemia hemoglobin-nadir 6 [summer 2020]. 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 JUNE 2023- NEGATIVE for hemolysis [haptoglobin-WNL]  # Today hemoglobin is  9.0; Proceed with Retacrit today.- MARCH 2024-  I sat-27 ferritin-71[name]- monthly-   # B12 def- FEB 2023- 232; continue B12 injections every monthly. [march 2023] ; B12 levels - 723 [NOV 2023] Proceed with injection today.  Stable.   # CKD stage III- Recommend continued p.o. intake.  Stable.   # TIA: on plavix [Dr.Shah]- stable.   Monthly B12; Ferra/retacrit q m  # DISPOSITION:  #  HOLD Ferrahem today;  # proceed with RETACRIT;  B 12 injection # Ferrahem in 2 weeks-  # in 4 weeks-  MD-  labs- -cbc;bmp;  possible Ferrahem or possible retacrit; B12 injection- Dr.B

## 2023-03-18 NOTE — Assessment & Plan Note (Signed)
Admitted 02/28/23 - 03/03/23 - with sob, cough and congestion.  Diagnosed with parainfluenza virus 3 respiratory infection.  Received mucomyst neb and duoneb for mucus clearance.  CXR with right basilar patchy opacities - suspicious for pneumonia.  Question if opacities could be from viral pneumonia.  Was treated for CAP with ceftriaxone and azithromycin.  Completed oral ceftin after discharge. Since being home she is doing better.  Breathing is getting better. Cough and congestion getting better.  Using incentive spirometer. Will need f/u cxr. Plan for 6 week cxr f/u.

## 2023-03-18 NOTE — Progress Notes (Signed)
[facility] OFFICE PROGRESS NOTE  Patient Care Team: Dale De Witt, MD as PCP - General (Internal Medicine) Dale Fairbank, MD (Internal Medicine) Marcina Millard, MD (Internal Medicine) Willeen Niece, MD (Specialist) Schnier, Latina Craver, MD (Vascular Surgery) Marcina Millard, MD (Internal Medicine) Willeen Niece, MD (Specialist) Schnier, Latina Craver, MD (Vascular Surgery) Earna Coder, MD as Consulting Physician (Oncology)   SUMMARY OF HEMATOLOGIC HISTORY:  # IRON DEFICIENCY ANEMIA- recurrent [? GI blood loss; Dr.Elliot; AVM-capsule]; IV Ferrahem q 98m; last colo- 2012/march; EGD- [id number].  July 2020 bone marrow biopsy-mild dysplastic changes; 40% hypercellularity; cytogenetics-normal.-Retacrit.MARCH 2023- Foundation One Hem- TP53 subclonal. B12 def- FEB 2023- Start B12 injections Monthly; MAY 3rd, 2023- Start Retacrit 20K [place].   #Colonoscopy-April 2020 aborted because of cardiac pause.  # CKD stage III-IV; CAD-Dr. Darrold Junker;  March 30th, 2022-TIA/slurred spleech [Dr.Shah]  INTERVAL HISTORY: In a wheelchair.  With her daughter.   89 year old female patient with above history of recurrent iron deficiency anemia unclear etiology/?-related to CKD-on Feraheme/Retacrit is here for follow-up.   Patient was recently evaluated on [facility]/28/23 [facility] for flu/pneumonia.   Mild fatigue. Notes of dyspnea. Light headedness: no; Blood in stool: no   Denies dizziness or fatigue. No visible bleeding. Patient denies any nausea vomiting.   Review of Systems  Constitutional:  Positive for malaise/fatigue. Negative for chills, diaphoresis, fever and weight loss.  HENT:  Negative for nosebleeds and sore throat.   Eyes:  Negative for double vision.  Respiratory:  Negative for cough, hemoptysis, sputum production and wheezing.   Cardiovascular:  Negative for palpitations, orthopnea and leg swelling.  Gastrointestinal:  Negative for abdominal pain, blood in stool,  constipation, diarrhea, heartburn, melena, nausea and vomiting.  Genitourinary:  Negative for dysuria, frequency and urgency.  Musculoskeletal:  Negative for back pain and joint pain.  Skin: Negative.  Negative for itching and rash.  Neurological:  Negative for dizziness, tingling, focal weakness, weakness and headaches.  Endo/Heme/Allergies:  Does not bruise/bleed easily.  Psychiatric/Behavioral:  Negative for depression. The patient is not nervous/anxious and does not have insomnia.      PAST MEDICAL HISTORY :  Past Medical History:  Diagnosis Date   Arthritis    AV malformation of gastrointestinal tract    Blood in stool    CAD (coronary artery disease)    Carotid arterial disease (HCC)    Cataracts, bilateral    Chronic blood loss anemia    Chronic cystitis    CKD (chronic kidney disease), stage III (HCC)    Complication of anesthesia    reaction to propofol - caused heart to pause   Depression    Diabetes mellitus (HCC)    GERD (gastroesophageal reflux disease)    Hyperlipidemia    Hypertension    IDA (iron deficiency anemia)    Stress incontinence    TIA (transient ischemic attack) 03/2021   No deficits   Wears dentures    full upper and lower    PAST SURGICAL HISTORY :   Past Surgical History:  Procedure Laterality Date   ABDOMINAL HYSTERECTOMY  11/19/1970   ovaries left in place   APPENDECTOMY  11/19/1990   BACK SURGERY  06/08/2010   L3, L4, L5   CARDIAC CATHETERIZATION  02/2001   CAROTID ARTERY ANGIOPLASTY  09/19/2002   CARPAL TUNNEL RELEASE  1991, 92   right then left    CATARACT EXTRACTION W/PHACO Left 02/13/2022   Procedure: CATARACT EXTRACTION PHACO AND INTRAOCULAR LENS PLACEMENT (IOC) LEFT DIABETIC 14.90 01:26.8;  Surgeon:  Galen Manila, MD;  Location[facility];  Service: Ophthalmology;  Laterality: Left;  Diabetic   CATARACT EXTRACTION W/PHACO Right 03/06/2022   Procedure: CATARACT EXTRACTION PHACO AND INTRAOCULAR LENS PLACEMENT (IOC)  RIGHT DIABETIC 9.66 00:53.7;  Surgeon: Galen Manila, MD;  Location: [facility];  Service: Ophthalmology;  Laterality: Right;  Diabetic   CHOLECYSTECTOMY  11/20/1991   COLONOSCOPY WITH PROPOFOL N/A 02/02/2019   Procedure: COLONOSCOPY WITH PROPOFOL;  Surgeon: Scot Jun, MD;  Location: [facility];  Service: Endoscopy;  Laterality: N/A;   ESOPHAGOGASTRODUODENOSCOPY N/A 02/02/2019   Procedure: ESOPHAGOGASTRODUODENOSCOPY (EGD);  Surgeon: Scot Jun, MD;  [facility];  Service: Endoscopy;  Laterality: N/A;   FOOT SURGERY  06/20/2007   Left   right carotid artery surgery  01/09/2013   Dr. Gilda Crease @ [facility]   TOTAL HIP ARTHROPLASTY  05/03/2011   left 12, right 11/07    FAMILY HISTORY :   Family History  Problem Relation Age of Onset   Hyperlipidemia Father    Heart disease Father        myocardial infarction   Hypertension Father        Parent   Arthritis Other        parent   Diabetes Other        nephew   Cervical cancer Sister    Rectal cancer Sister    Breast cancer Neg Hx     SOCIAL HISTORY:   Social History   Tobacco Use   Smoking status: Former    Types: Cigarettes    Quit date: 11/19/1989    Years since quitting: 33.3   Smokeless tobacco: Never  Vaping Use   Vaping Use: Never used  Substance Use Topics   Alcohol use: No    Alcohol/week: 0.0 standard drinks of alcohol   Drug use: No    ALLERGIES:  is allergic to propofol, ciprofloxacin, levaquin [levofloxacin], and tequin [gatifloxacin].  MEDICATIONS:  Current Outpatient Medications  Medication Sig Dispense Refill   aspirin 81 MG tablet Take 81 mg by mouth daily.     Biotin 5000 MCG CAPS Take 1 capsule by mouth daily.     cetirizine (ZYRTEC) 10 MG tablet Take 10 mg by mouth daily.     docusate sodium (COLACE) 100 MG capsule Take 100 mg by mouth daily as needed for mild constipation.     ferrous sulfate 325 (65 FE) MG EC tablet Take 325 mg by mouth every other day. Every  other day     guaiFENesin-dextromethorphan (ROBITUSSIN DM) 100-10 MG/5ML syrup Take 5 mLs by mouth every 4 (four) hours as needed for cough. 118 mL 0   Omega-3 Fatty Acids (FISH OIL) 1200 MG CAPS Take 1 capsule by mouth daily.     Probiotic, Lactobacillus, CAPS Take 1 capsule by mouth daily.     rosuvastatin (CRESTOR) 40 MG tablet TAKE 1 TABLET BY MOUTH EVERY DAY (Patient taking differently: Take 40 mg by mouth daily.) 90 tablet 1   traZODone (DESYREL) 50 MG tablet TAKE 1/2 TO 1 TABLET BY MOUTH AT BEDTIME AS NEEDED FOR SLEEP (Patient taking differently: Take 25-50 mg by mouth at bedtime as needed for sleep. for sleep) 90 tablet 1   triamcinolone cream (KENALOG) 0.1 % Apply 1 Application topically 2 (two) times daily. On affected area on leg.  Use for 7-10 days only - then stop. 30 g 0   No current facility-administered medications for this visit.    PHYSICAL EXAMINATION:   BP Marland Kitchen)  118/49 (BP Location: Left Arm, Patient Position: Sitting, Cuff Size: Normal)   Pulse 80   Temp 98 F (36.7 C) (Tympanic)   Ht 4\' 11"  (1.499 m)   Wt 139 lb 6.4 oz (63.2 kg)   SpO2 97%   BMI 28.16 kg/m   Filed Weights   03/18/23 1310  Weight: 139 lb 6.4 oz (63.2 kg)     Physical Exam Constitutional:      Comments: [place] Caucasian female patient.  She is in a wheelchair.  HENT:     Head: Normocephalic and atraumatic.     Mouth/Throat:     Pharynx: No oropharyngeal exudate.  Eyes:     Pupils: Pupils are equal, round, and reactive to light.     Comments: Positive for pallor.  Cardiovascular:     Rate and Rhythm: Normal rate and regular rhythm.  Pulmonary:     Effort: No respiratory distress.     Breath sounds: No wheezing.  Abdominal:     General: Bowel sounds are normal. There is no distension.     Palpations: Abdomen is soft. There is no mass.     Tenderness: There is no abdominal tenderness. There is no guarding or rebound.  Musculoskeletal:        General: No tenderness. Normal  range of motion.     Cervical back: Normal range of motion and neck supple.  Skin:    General: Skin is warm.  Neurological:     Mental Status: She is alert and oriented to person, place, and time.  Psychiatric:        Mood and Affect: Affect normal.     LABORATORY DATA:  I have reviewed the data as listed    Component Value Date/Time   NA 134 (L) 03/18/2023 1308   NA 143 01/10/2013 0314   K 3.9 03/18/2023 1308   K 4.0 01/10/2013 0314   CL 104 03/18/2023 1308   CL 109 (H) 01/10/2013 0314   CO2 24 03/18/2023 1308   CO2 27 01/10/2013 0314   GLUCOSE 145 (H) 03/18/2023 1308   GLUCOSE 119 (H) 01/10/2013 0314   BUN 17 03/18/2023 1308   BUN 15 01/10/2013 0314   CREATININE 1.08 (H) 03/18/2023 1308   CREATININE 1.03 01/10/2013 0314   CREATININE 1.13 (H) 09/18/2012 0848   CALCIUM 8.3 (L) 03/18/2023 1308   CALCIUM 8.1 (L) 01/10/2013 0314   PROT 6.7 12/24/2022 1521   PROT 7.2 12/20/2011 1521   ALBUMIN 4.2 12/24/2022 1521   ALBUMIN 3.9 12/20/2011 1521   AST 36 12/24/2022 1521   AST 31 12/20/2011 1521   ALT 28 12/24/2022 1521   ALT 37 12/20/2011 1521   ALKPHOS 57 12/24/2022 1521   ALKPHOS 57 12/20/2011 1521   BILITOT 0.4 12/24/2022 1521   BILITOT 0.5 12/20/2011 1521   GFRNONAA 49 (L) 03/18/2023 1308   GFRNONAA 52 (L) 01/10/2013 0314   GFRNONAA 47 (L) 09/18/2012 0848   GFRAA 46 (L) 07/12/2020 1240   GFRAA >60 01/10/2013 0314   GFRAA 54 (L) 09/18/2012 0848    No results found for: "SPEP", "UPEP"  Lab Results  Component Value Date   WBC 6.7 03/18/2023   NEUTROABS 4.9 03/18/2023   HGB 9.0 (L) 03/18/2023   HCT 28.7 (L) 03/18/2023   MCV 104.4 (H) 03/18/2023   PLT 233 03/18/2023      Chemistry      Component Value Date/Time   NA 134 (L) 03/18/2023 1308   NA 143 01/10/2013 0314  K 3.9 03/18/2023 1308   K 4.0 01/10/2013 0314   CL 104 03/18/2023 1308   CL 109 (H) 01/10/2013 0314   CO2 24 03/18/2023 1308   CO2 27 01/10/2013 0314   BUN 17 03/18/2023 1308   BUN 15  01/10/2013 0314   CREATININE 1.08 (H) 03/18/2023 1308   CREATININE 1.03 01/10/2013 0314   CREATININE 1.13 (H) 09/18/2012 0848      Component Value Date/Time   CALCIUM 8.3 (L) 03/18/2023 1308   CALCIUM 8.1 (L) 01/10/2013 0314   ALKPHOS 57 12/24/2022 1521   ALKPHOS 57 12/20/2011 1521   AST 36 12/24/2022 1521   AST 31 12/20/2011 1521   ALT 28 12/24/2022 1521   ALT 37 12/20/2011 1521   BILITOT 0.4 12/24/2022 1521   BILITOT 0.5 12/20/2011 1521      ASSESSMENT & PLAN:   Anemia of chronic kidney failure, stage 3 (moderate) (HCC) #Severe anemia hemoglobin-nadir 6 [summer 2020]. 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 JUNE 2023- NEGATIVE for hemolysis [haptoglobin-WNL]  # Today hemoglobin is  9.0; Proceed with Retacrit today.- MARCH 2024-  I sat-27 ferritin-71[name]- monthly-   # B12 def- FEB 2023- 232; continue B12 injections every monthly. [march 2023] ; B12 levels - 723 [NOV 2023] Proceed with injection today.  Stable.   # CKD stage III- Recommend continued p.o. intake.  Stable.   # TIA: on plavix [Dr.Shah]- stable.   Monthly B12; Ferra/retacrit q m  # DISPOSITION:  #  HOLD Ferrahem today;  # proceed with RETACRIT;  B 12 injection # Ferrahem in 2 weeks-  # in 4 weeks-  MD-  labs- -cbc;bmp;  possible Ferrahem or possible retacrit; B12 injection- Dr.B   Earna Coder, MD 03/18/2023 2:39 PM

## 2023-03-18 NOTE — Addendum Note (Signed)
Addended by: Charm Barges on: 03/18/2023 09:33 AM   Modules accepted: Orders

## 2023-03-18 NOTE — Assessment & Plan Note (Signed)
Circular rash - trial of triamcinolone cream.

## 2023-03-18 NOTE — Assessment & Plan Note (Signed)
Continue to avoid antiinflammatories.  Stay hydrated.  Follow metabolic panel. Off ace inhibitor due to low blood pressure. Will have metabolic panel checked through hematology next week.

## 2023-03-18 NOTE — Assessment & Plan Note (Signed)
Continue crestor.  Low cholesterol diet and exercise.  Follow lipid panel and liver function tests.  

## 2023-03-18 NOTE — Assessment & Plan Note (Signed)
pt with recent hospitalization for pneumonia/para influenza. with perisistent weakness and left hip pain. needs PT to evaluated and treat

## 2023-03-18 NOTE — Assessment & Plan Note (Signed)
Off imdur due to low blood pressure.  Remains on crestor.  Currently without symptoms.  

## 2023-03-18 NOTE — Assessment & Plan Note (Signed)
Follow cbc.  Being followed by hematology.  

## 2023-03-18 NOTE — Assessment & Plan Note (Signed)
Sugars have been under reasonable control.  Follow.  

## 2023-03-18 NOTE — Progress Notes (Signed)
Fatigue/weakness: no Dyspena: yes Light headedness: no Blood in stool: no   02/28/23 in [facility] for flu.

## 2023-03-18 NOTE — Assessment & Plan Note (Signed)
Has been followed by hematology - intermittent iron infusions.  Has f/u with hematology next week.  Recheck cbc then.

## 2023-03-18 NOTE — Assessment & Plan Note (Signed)
Admitted 02/28/23 - 03/03/23 - with sob, cough and congestion.  Diagnosed with parainfluenza virus 3 respiratory infection.  Received mucomyst neb and duoneb for mucus clearance.  CXR with right basilar patchy opacities - suspicious for pneumonia.  Question if opacities could be from viral pneumonia.  Was treated for CAP with ceftriaxone and azithromycin.  Completed oral ceftin after discharge. Since being home she is doing better.  Breathing is getting better. Cough and congestion getting better.  Using incentive spirometer. Continue incentive spirometer.  Discussed PT as outlined.  Agreeable for home health PT.

## 2023-03-18 NOTE — Assessment & Plan Note (Signed)
Continue crestor 

## 2023-03-18 NOTE — Assessment & Plan Note (Addendum)
Continue antiplatelet therapy as prescribed Continue management of CAD, HTN and Hyperlipidemia Evaluated [facility] 08/20/22 -ultrasound shows RICA 1-39% and LICA 40-59% stenosis bilaterally s/p left CEA.  Recommended f/u in 12 months.

## 2023-03-22 ENCOUNTER — Telehealth: Payer: Self-pay | Admitting: Internal Medicine

## 2023-03-22 NOTE — Telephone Encounter (Signed)
Contacted Jocelyn Elliott to schedule their annual wellness visit. Appointment made for 04/02/2023.  [name]; Care Guide Ambulatory Clinical Support Effie l [facility] Direct Dial: [phone]

## 2023-03-30 ENCOUNTER — Other Ambulatory Visit: Payer: Self-pay | Admitting: Internal Medicine

## 2023-04-01 ENCOUNTER — Inpatient Hospital Stay: Payer: Medicare Other | Attending: Internal Medicine

## 2023-04-01 VITALS — BP 97/64 | HR 76 | Temp 96.5°F | Resp 16

## 2023-04-01 DIAGNOSIS — N183 Chronic kidney disease, stage 3 unspecified: Secondary | ICD-10-CM | POA: Diagnosis present

## 2023-04-01 DIAGNOSIS — D631 Anemia in chronic kidney disease: Secondary | ICD-10-CM | POA: Diagnosis present

## 2023-04-01 DIAGNOSIS — D508 Other iron deficiency anemias: Secondary | ICD-10-CM

## 2023-04-01 MED ORDER — SODIUM CHLORIDE 0.9 % IV SOLN
510.0000 mg | Freq: Once | INTRAVENOUS | Status: AC
  Administered 2023-04-01: 510 mg via INTRAVENOUS
  Filled 2023-04-01: qty 17

## 2023-04-01 MED ORDER — SODIUM CHLORIDE 0.9 % IV SOLN
Freq: Once | INTRAVENOUS | Status: AC
  Filled 2023-04-01: qty 250

## 2023-04-01 NOTE — Telephone Encounter (Signed)
Please confirm if Ms Pustejovsky needs lancets.  Received refill request.

## 2023-04-01 NOTE — Telephone Encounter (Signed)
The original prescription was discontinued on 03/03/2023 by Darlin Priestly, MD for the following reason: Stop Taking at Discharge. Renewing this prescription may not be appropriate.    Please advise if patient should continue checking BG levels. Thanks!

## 2023-04-02 ENCOUNTER — Ambulatory Visit (INDEPENDENT_AMBULATORY_CARE_PROVIDER_SITE_OTHER): Payer: Medicare Other

## 2023-04-02 VITALS — Wt 139.0 lb

## 2023-04-02 DIAGNOSIS — Z0000 Encounter for general adult medical examination without abnormal findings: Secondary | ICD-10-CM | POA: Diagnosis not present

## 2023-04-02 NOTE — Patient Instructions (Signed)
Jocelyn Elliott , Thank you for taking time to come for your Medicare Wellness Visit. I appreciate your ongoing commitment to your health goals. Please review the following plan we discussed and let me know if I can assist you in the future.   These are the goals we discussed:  Goals      Follow up with Primary Care Provider     As needed.        This is a list of the screening recommended for you and due dates:  Health Maintenance  Topic Date Due   DTaP/Tdap/Td vaccine (1 - Tdap) Never done   COVID-19 Vaccine (4 - 2023-24 season) 07/20/2022   Eye exam for diabetics  01/03/2023   Complete foot exam   03/14/2023   Zoster (Shingles) Vaccine (1 of 2) 04/30/2024*   Flu Shot  06/20/2023   Hemoglobin A1C  06/24/2023   Mammogram  07/17/2023   Medicare Annual Wellness Visit  04/01/2024   Pneumonia Vaccine  Completed   HPV Vaccine  Aged Out   DEXA scan (bone density measurement)  Discontinued  *Topic was postponed. The date shown is not the original due date.    Advanced directives: Please bring a copy of your health care power of attorney and living will to the office to be added to your chart at your convenience.   Conditions/risks identified: Diabetes Mellitus and Nutrition, Adult When you have diabetes, or diabetes mellitus, it is very important to have healthy eating habits because your blood sugar (glucose) levels are greatly affected by what you eat and drink. Eating healthy foods in the right amounts, at about the same times every day, can help you: Manage your blood glucose. Lower your risk of heart disease. Improve your blood pressure. Reach or maintain a healthy weight. What can affect my meal plan? Every person with diabetes is different, and each person has different needs for a meal plan. Your health care provider may recommend that you work with a dietitian to make a meal plan that is best for you. Your meal plan may vary depending on factors such as: The calories you  need. The medicines you take. Your weight. Your blood glucose, blood pressure, and cholesterol levels. Your activity level. Other health conditions you have, such as heart or kidney disease. How do carbohydrates affect me? Carbohydrates, also called carbs, affect your blood glucose level more than any other type of food. Eating carbs raises the amount of glucose in your blood. It is important to know how many carbs you can safely have in each meal. This is different for every person. Your dietitian can help you calculate how many carbs you should have at each meal and for each snack. How does alcohol affect me? Alcohol can cause a decrease in blood glucose (hypoglycemia), especially if you use insulin or take certain diabetes medicines by mouth. Hypoglycemia can be a life-threatening condition. Symptoms of hypoglycemia, such as sleepiness, dizziness, and confusion, are similar to symptoms of having too much alcohol. Do not drink alcohol if: Your health care provider tells you not to drink. You are pregnant, may be pregnant, or are planning to become pregnant. If you drink alcohol: Limit how much you have to: 0-1 drink a day for women. 0-2 drinks a day for men. Know how much alcohol is in your drink. In the [place] one drink equals one 12 oz bottle of beer (355 mL), one 5 oz glass of wine (148 mL), or one 1 oz glass of  hard liquor (44 mL). Keep yourself hydrated with water, diet soda, or unsweetened iced tea. Keep in mind that regular soda, juice, and other mixers may contain a lot of sugar and must be counted as carbs. What are tips for following this plan?  Reading food labels Start by checking the serving size on the Nutrition Facts label of packaged foods and drinks. The number of calories and the amount of carbs, fats, and other nutrients listed on the label are based on one serving of the item. Many items contain more than one serving per package. Check the total grams (g) of carbs in one  serving. Check the number of grams of saturated fats and trans fats in one serving. Choose foods that have a low amount or none of these fats. Check the number of milligrams (mg) of salt (sodium) in one serving. Most people should limit total sodium intake to less than 2,300 mg per day. Always check the nutrition information of foods labeled as "low-fat" or "nonfat." These foods may be higher in added sugar or refined carbs and should be avoided. Talk to your dietitian to identify your daily goals for nutrients listed on the label. Shopping Avoid buying canned, pre-made, or processed foods. These foods tend to be high in fat, sodium, and added sugar. Shop around the outside edge of the grocery store. This is where you will most often find fresh fruits and vegetables, bulk grains, fresh meats, and fresh dairy products. Cooking Use low-heat cooking methods, such as baking, instead of high-heat cooking methods, such as deep frying. Cook using healthy oils, such as olive, canola, or sunflower oil. Avoid cooking with butter, cream, or high-fat meats. Meal planning Eat meals and snacks regularly, preferably at the same times every day. Avoid going long periods of time without eating. Eat foods that are high in fiber, such as fresh fruits, vegetables, beans, and whole grains. Eat 4-6 oz (112-168 g) of lean protein each day, such as lean meat, chicken, fish, eggs, or tofu. One ounce (oz) (28 g) of lean protein is equal to: 1 oz (28 g) of meat, chicken, or fish. 1 egg.  cup (62 g) of tofu. Eat some foods each day that contain healthy fats, such as avocado, nuts, seeds, and fish. What foods should I eat? Fruits Berries. Apples. Oranges. Peaches. Apricots. Plums. Grapes. Mangoes. Papayas. Pomegranates. Kiwi. Cherries. Vegetables Leafy greens, including lettuce, spinach, kale, chard, collard greens, mustard greens, and cabbage. Beets. Cauliflower. Broccoli. Carrots. Green beans. Tomatoes. Peppers.  Onions. Cucumbers. Brussels sprouts. Grains Whole grains, such as whole-wheat or whole-grain bread, crackers, tortillas, cereal, and pasta. Unsweetened oatmeal. Quinoa. Brown or wild rice. Meats and other proteins Seafood. Poultry without skin. Lean cuts of poultry and beef. Tofu. Nuts. Seeds. Dairy Low-fat or fat-free dairy products such as milk, yogurt, and cheese. The items listed above may not be a complete list of foods and beverages you can eat and drink. Contact a dietitian for more information. What foods should I avoid? Fruits Fruits canned with syrup. Vegetables Canned vegetables. Frozen vegetables with butter or cream sauce. Grains Refined white flour and flour products such as bread, pasta, snack foods, and cereals. Avoid all processed foods. Meats and other proteins Fatty cuts of meat. Poultry with skin. Breaded or fried meats. Processed meat. Avoid saturated fats. Dairy Full-fat yogurt, cheese, or milk. Beverages Sweetened drinks, such as soda or iced tea. The items listed above may not be a complete list of foods and beverages you should avoid.  Contact a dietitian for more information. Questions to ask a health care provider Do I need to meet with a certified diabetes care and education specialist? Do I need to meet with a dietitian? What number can I call if I have questions? When are the best times to check my blood glucose? Where to find more information: American Diabetes Association: diabetes.org Academy of Nutrition and Dietetics: eatright.[employer] of Diabetes and Digestive and Kidney Diseases: StageSync.si Association of Diabetes Care & Education Specialists: diabeteseducator.org Summary It is important to have healthy eating habits because your blood sugar (glucose) levels are greatly affected by what you eat and drink. It is important to use alcohol carefully. A healthy meal plan will help you manage your blood glucose and lower your risk of  heart disease. Your health care provider may recommend that you work with a dietitian to make a meal plan that is best for you. This information is not intended to replace advice given to you by your health care provider. Make sure you discuss any questions you have with your health care provider. Document Revised: 06/08/2020 Document Reviewed: 06/08/2020 Elsevier Patient Education  2023 [employer].   Next appointment: Follow up in one year for your annual wellness visit    Preventive Care 65 Years and Older, Female Preventive care refers to lifestyle choices and visits with your health care provider that can promote health and wellness. What does preventive care include? A yearly physical exam. This is also called an annual well check. Dental exams once or twice a year. Routine eye exams. Ask your health care provider how often you should have your eyes checked. Personal lifestyle choices, including: Daily care of your teeth and gums. Regular physical activity. Eating a healthy diet. Avoiding tobacco and drug use. Limiting alcohol use. Practicing safe sex. Taking low-dose aspirin every day. Taking vitamin and mineral supplements as recommended by your health care provider. What happens during an annual well check? The services and screenings done by your health care provider during your annual well check will depend on your age, overall health, lifestyle risk factors, and family history of disease. Counseling  Your health care provider may ask you questions about your: Alcohol use. Tobacco use. Drug use. Emotional well-being. Home and relationship well-being. Sexual activity. Eating habits. History of falls. Memory and ability to understand (cognition). Work and work Astronomer. Reproductive health. Screening  You may have the following tests or measurements: Height, weight, and BMI. Blood pressure. Lipid and cholesterol levels. These may be checked every 5 years, or  more frequently if you are over 26 years old. Skin check. Lung cancer screening. You may have this screening every year starting at age 28 if you have a 30-pack-year history of smoking and currently smoke or have quit within the past 15 years. Fecal occult blood test (FOBT) of the stool. You may have this test every year starting at age 74. Flexible sigmoidoscopy or colonoscopy. You may have a sigmoidoscopy every 5 years or a colonoscopy every 10 years starting at age 22. Hepatitis C blood test. Hepatitis B blood test. Sexually transmitted disease (STD) testing. Diabetes screening. This is done by checking your blood sugar (glucose) after you have not eaten for a while (fasting). You may have this done every 1-3 years. Bone density scan. This is done to screen for osteoporosis. You may have this done starting at age 7. Mammogram. This may be done every 1-2 years. Talk to your health care provider about how often  you should have regular mammograms. Talk with your health care provider about your test results, treatment options, and if necessary, the need for more tests. Vaccines  Your health care provider may recommend certain vaccines, such as: Influenza vaccine. This is recommended every year. Tetanus, diphtheria, and acellular pertussis (Tdap, Td) vaccine. You may need a Td booster every 10 years. Zoster vaccine. You may need this after age 87. Pneumococcal 13-valent conjugate (PCV13) vaccine. One dose is recommended after age 68. Pneumococcal polysaccharide (PPSV23) vaccine. One dose is recommended after age 36. Talk to your health care provider about which screenings and vaccines you need and how often you need them. This information is not intended to replace advice given to you by your health care provider. Make sure you discuss any questions you have with your health care provider. Document Released: 12/02/2015 Document Revised: 07/25/2016 Document Reviewed: 09/06/2015 Elsevier  Interactive Patient Education  2017 [employer].  Fall Prevention in the Home Falls can cause injuries. They can happen to people of all ages. There are many things you can do to make your home safe and to help prevent falls. What can I do on the outside of my home? Regularly fix the edges of walkways and driveways and fix any cracks. Remove anything that might make you trip as you walk through a door, such as a raised step or threshold. Trim any bushes or trees on the path to your home. Use bright outdoor lighting. Clear any walking paths of anything that might make someone trip, such as rocks or tools. Regularly check to see if handrails are loose or broken. Make sure that both sides of any steps have handrails. Any raised decks and porches should have guardrails on the edges. Have any leaves, snow, or ice cleared regularly. Use sand or salt on walking paths during winter. Clean up any spills in your garage right away. This includes oil or grease spills. What can I do in the bathroom? Use night lights. Install grab bars by the toilet and in the tub and shower. Do not use towel bars as grab bars. Use non-skid mats or decals in the tub or shower. If you need to sit down in the shower, use a plastic, non-slip stool. Keep the floor dry. Clean up any water that spills on the floor as soon as it happens. Remove soap buildup in the tub or shower regularly. Attach bath mats securely with double-sided non-slip rug tape. Do not have throw rugs and other things on the floor that can make you trip. What can I do in the bedroom? Use night lights. Make sure that you have a light by your bed that is easy to reach. Do not use any sheets or blankets that are too big for your bed. They should not hang down onto the floor. Have a firm chair that has side arms. You can use this for support while you get dressed. Do not have throw rugs and other things on the floor that can make you trip. What can I do  in the kitchen? Clean up any spills right away. Avoid walking on wet floors. Keep items that you use a lot in easy-to-reach places. If you need to reach something above you, use a strong step stool that has a grab bar. Keep electrical cords out of the way. Do not use floor polish or wax that makes floors slippery. If you must use wax, use non-skid floor wax. Do not have throw rugs and other things on  the floor that can make you trip. What can I do with my stairs? Do not leave any items on the stairs. Make sure that there are handrails on both sides of the stairs and use them. Fix handrails that are broken or loose. Make sure that handrails are as long as the stairways. Check any carpeting to make sure that it is firmly attached to the stairs. Fix any carpet that is loose or worn. Avoid having throw rugs at the top or bottom of the stairs. If you do have throw rugs, attach them to the floor with carpet tape. Make sure that you have a light switch at the top of the stairs and the bottom of the stairs. If you do not have them, ask someone to add them for you. What else can I do to help prevent falls? Wear shoes that: Do not have high heels. Have rubber bottoms. Are comfortable and fit you well. Are closed at the toe. Do not wear sandals. If you use a stepladder: Make sure that it is fully opened. Do not climb a closed stepladder. Make sure that both sides of the stepladder are locked into place. Ask someone to hold it for you, if possible. Clearly mark and make sure that you can see: Any grab bars or handrails. First and last steps. Where the edge of each step is. Use tools that help you move around (mobility aids) if they are needed. These include: Canes. Walkers. Scooters. Crutches. Turn on the lights when you go into a dark area. Replace any light bulbs as soon as they burn out. Set up your furniture so you have a clear path. Avoid moving your furniture around. If any of your  floors are uneven, fix them. If there are any pets around you, be aware of where they are. Review your medicines with your doctor. Some medicines can make you feel dizzy. This can increase your chance of falling. Ask your doctor what other things that you can do to help prevent falls. This information is not intended to replace advice given to you by your health care provider. Make sure you discuss any questions you have with your health care provider. Document Released: 09/01/2009 Document Revised: 04/12/2016 Document Reviewed: 12/10/2014 Elsevier Interactive Patient Education  2017 [employer].

## 2023-04-02 NOTE — Progress Notes (Cosign Needed Addendum)
Subjective:   Jocelyn Elliott is a 89 y.o. female who presents for Medicare Annual (Subsequent) preventive examination.  Review of Systems    I connected with  MAXIME NEEF on 04/02/23 by a audio enabled telemedicine application and verified that I am speaking with the correct person using two identifiers.  Patient Location: Home  Provider Location: Home Office  I discussed the limitations of evaluation and management by telemedicine. The patient expressed understanding and agreed to proceed.  Cardiac Risk Factors include: advanced age (>68men, >21 women);diabetes mellitus;hypertension     Objective:    Today's Vitals   04/02/23 1426  Weight: 139 lb (63 kg)   Body mass index is 28.07 kg/m.     04/02/2023    2:33 PM 03/18/2023    1:10 PM 03/01/2023    6:00 AM 02/05/2023    2:42 PM 01/08/2023    3:19 PM 12/11/2022    3:21 PM 11/13/2022    2:45 PM  Advanced Directives  Does Patient Have a Medical Advance Directive? Yes Yes Yes Yes Yes Yes Yes  Type of Estate agent of [place];Living will Healthcare Power of [employer] Power of [employer] Power of [place];Living will Healthcare Power of [place];Living will Healthcare Power of [place];Living will Healthcare Power of [place];Living will  Does patient want to make changes to medical advance directive?   No - Patient declined  No - Patient declined No - Patient declined   Copy of Healthcare Power of Attorney in Chart? No - copy requested  No - copy requested  No - copy requested No - copy requested     Current Medications (verified) Outpatient Encounter Medications as of 04/02/2023  Medication Sig   aspirin 81 MG tablet Take 81 mg by mouth daily.   Biotin 5000 MCG CAPS Take 1 capsule by mouth daily.   cetirizine (ZYRTEC) 10 MG tablet Take 10 mg by mouth daily.   docusate sodium (COLACE) 100 MG capsule Take 100 mg by mouth daily as needed for mild constipation.   ferrous sulfate 325  (65 FE) MG EC tablet Take 325 mg by mouth every other day. Every other day   guaiFENesin-dextromethorphan (ROBITUSSIN DM) 100-10 MG/5ML syrup Take 5 mLs by mouth every 4 (four) hours as needed for cough.   Omega-3 Fatty Acids (FISH OIL) 1200 MG CAPS Take 1 capsule by mouth daily.   Probiotic, Lactobacillus, CAPS Take 1 capsule by mouth daily.   rosuvastatin (CRESTOR) 40 MG tablet TAKE 1 TABLET BY MOUTH EVERY DAY (Patient taking differently: Take 40 mg by mouth daily.)   traZODone (DESYREL) 50 MG tablet TAKE 1/2 TO 1 TABLET BY MOUTH AT BEDTIME AS NEEDED FOR SLEEP (Patient taking differently: Take 25-50 mg by mouth at bedtime as needed for sleep. for sleep)   triamcinolone cream (KENALOG) 0.1 % Apply 1 Application topically 2 (two) times daily. On affected area on leg.  Use for 7-10 days only - then stop.   No facility-administered encounter medications on file as of 04/02/2023.    Allergies (verified) Propofol, Ciprofloxacin, Levaquin [levofloxacin], and Tequin [gatifloxacin]   History: Past Medical History:  Diagnosis Date   Arthritis    AV malformation of gastrointestinal tract    Blood in stool    CAD (coronary artery disease)    Carotid arterial disease (HCC)    Cataracts, bilateral    Chronic blood loss anemia    Chronic cystitis    CKD (chronic kidney disease), stage III (HCC)  Complication of anesthesia    reaction to propofol - caused heart to pause   Depression    Diabetes mellitus (HCC)    GERD (gastroesophageal reflux disease)    Hyperlipidemia    Hypertension    IDA (iron deficiency anemia)    Stress incontinence    TIA (transient ischemic attack) 03/2021   No deficits   Wears dentures    full upper and lower   Past Surgical History:  Procedure Laterality Date   ABDOMINAL HYSTERECTOMY  11/19/1970   ovaries left in place   APPENDECTOMY  11/19/1990   BACK SURGERY  06/08/2010   L3, L4, L5   CARDIAC CATHETERIZATION  02/2001   CAROTID ARTERY ANGIOPLASTY   09/19/2002   CARPAL TUNNEL RELEASE  1991, 92   right then left    CATARACT EXTRACTION W/PHACO Left 02/13/2022   Procedure: CATARACT EXTRACTION PHACO AND INTRAOCULAR LENS PLACEMENT (IOC) LEFT DIABETIC 14.90 01:26.8;  Surgeon: Galen Manila, MD;  Location: [facility];  Service: Ophthalmology;  Laterality: Left;  Diabetic   CATARACT EXTRACTION W/PHACO Right 03/06/2022   Procedure: CATARACT EXTRACTION PHACO AND INTRAOCULAR LENS PLACEMENT (IOC) RIGHT DIABETIC 9.66 00:53.7;  Surgeon: Galen Manila, MD;  Location: [facility];  Service: Ophthalmology;  Laterality: Right;  Diabetic   CHOLECYSTECTOMY  11/20/1991   COLONOSCOPY WITH PROPOFOL N/A 02/02/2019   Procedure: COLONOSCOPY WITH PROPOFOL;  Surgeon: Scot Jun, MD;  Location: [facility];  Service: Endoscopy;  Laterality: N/A;   ESOPHAGOGASTRODUODENOSCOPY N/A 02/02/2019   Procedure: ESOPHAGOGASTRODUODENOSCOPY (EGD);  Surgeon: Scot Jun, MD;  Location[facility];  Service: Endoscopy;  Laterality: N/A;   FOOT SURGERY  06/20/2007   Left   right carotid artery surgery  01/09/2013   Dr. Gilda Crease @ [facility]   TOTAL HIP ARTHROPLASTY  05/03/2011   left 12, right 11/07   Family History  Problem Relation Age of Onset   Hyperlipidemia Father    Heart disease Father        myocardial infarction   Hypertension Father        Parent   Arthritis Other        parent   Diabetes Other        nephew   Cervical cancer Sister    Rectal cancer Sister    Breast cancer Neg Hx    Social History   Socioeconomic History   Marital status: Widowed    Spouse name: Not on file   Number of children: 1   Years of education: 43   Highest education level: Not on file  Occupational History   Occupation: Retired  Tobacco Use   Smoking status: Former    Types: Cigarettes    Quit date: 11/19/1989    Years since quitting: 33.3   Smokeless tobacco: Never  Vaping Use   Vaping Use: Never used  Substance and Sexual Activity    Alcohol use: No    Alcohol/week: 0.0 standard drinks of alcohol   Drug use: No   Sexual activity: Not Currently  Other Topics Concern   Not on file  Social History Narrative   Regular exercise-no   Caffeine Use-yes   Social Determinants of Health   Financial Resource Strain: Low Risk  (04/02/2023)   Overall Financial Resource Strain (CARDIA)    Difficulty of Paying Living Expenses: Not hard at all  Food Insecurity: No Food Insecurity (04/02/2023)   Hunger Vital Sign    Worried About Running Out of Food in the Last Year: Never true  Ran Out of Food in the Last Year: Never true  Transportation Needs: No Transportation Needs (04/02/2023)   PRAPARE - Administrator, Civil Service (Medical): No    Lack of Transportation (Non-Medical): No  Physical Activity: Insufficiently Active (04/02/2023)   Exercise Vital Sign    Days of Exercise per Week: 7 days    Minutes of Exercise per Session: 20 min  Stress: No Stress Concern Present (04/02/2023)   [employer] of Occupational Health - Occupational Stress Questionnaire    Feeling of Stress : Not at all  Social Connections: Socially Integrated (04/02/2023)   Social Connection and Isolation Panel [NHANES]    Frequency of Communication with Friends and Family: More than three times a week    Frequency of Social Gatherings with Friends and Family: More than three times a week    Attends Religious Services: More than 4 times per year    Active Member of [employer] or Organizations: Yes    Attends Engineer, structural: More than 4 times per year    Marital Status: Married    Tobacco Counseling Counseling given: Yes   Clinical Intake:  Pre-visit preparation completed: Yes  Pain : No/denies pain     BMI - recorded: 28.07 Nutritional Status: BMI 25 -29 Overweight Nutritional Risks: None Diabetes: Yes CBG done?: No Did pt. bring in CBG monitor from home?: No  How often do you need to have someone help you when  you read instructions, pamphlets, or other written materials from your doctor or pharmacy?: 1 - Never  Diabetic?yes  Interpreter Needed?: No  Information entered by :: Fredirick Maudlin   Activities of Daily Living    04/02/2023    2:35 PM 03/01/2023    6:00 AM  In your present state of health, do you have any difficulty performing the following activities:  Hearing? 0 0  Vision? 0 1  Difficulty concentrating or making decisions? 0 0  Walking or climbing stairs? 1 1  Comment use walker/cane   Dressing or bathing? 0 0  Doing errands, shopping? 0 1  Preparing Food and eating ? N   Using the Toilet? N   In the past six months, have you accidently leaked urine? N   Do you have problems with loss of bowel control? N   Managing your Medications? N   Managing your Finances? N   Housekeeping or managing your Housekeeping? N     Patient Care Team: Dale Legend Lake, MD as PCP - General (Internal Medicine) Dale Stanhope, MD (Internal Medicine) Marcina Millard, MD (Internal Medicine) Willeen Niece, MD (Specialist) Schnier, Latina Craver, MD (Vascular Surgery) Marcina Millard, MD (Internal Medicine) Willeen Niece, MD (Specialist) Schnier, Latina Craver, MD (Vascular Surgery) Earna Coder, MD as Consulting Physician (Oncology)  Indicate any recent Medical Services you may have received from other than [facility] providers in the past year (date may be approximate).     Assessment:   This is a routine wellness examination for [place].  Hearing/Vision screen Hearing Screening - Comments:: Denies hearing difficulties   Vision Screening - Comments:: Wears rx glasses - up to date with routine eye exams with  [facility]   Dietary issues and exercise activities discussed: Current Exercise Habits: Home exercise routine, Type of exercise: stretching;walking, Time (Minutes): 15, Frequency (Times/Week): 7, Weekly Exercise (Minutes/Week): 105, Intensity: Mild   Goals  Addressed   None   Depression Screen    04/02/2023    2:30 PM 03/27/2022  9:44 AM 09/15/2021    4:21 PM 04/11/2021    1:20 PM 03/24/2021   10:47 AM 02/07/2021   11:25 AM 02/04/2020   11:53 AM  PHQ 2/9 Scores  PHQ - 2 Score 0 0 0 0 0 1 0    Fall Risk    04/02/2023    2:34 PM 03/27/2022    9:44 AM 09/15/2021    4:21 PM 04/11/2021    1:20 PM 03/24/2021   10:49 AM  Fall Risk   Falls in the past year? 0 0 1 1 0  Number falls in past yr: 0 0 0 1 0  Injury with Fall? 0  0 0 0  Risk for fall due to : No Fall Risks  History of fall(s)    Follow up Falls prevention discussed;Falls evaluation completed Falls evaluation completed Falls evaluation completed Falls evaluation completed Falls evaluation completed    FALL RISK PREVENTION PERTAINING TO THE HOME:  Any stairs in or around the home? No  If so, are there any without handrails? No  Home free of loose throw rugs in walkways, pet beds, electrical cords, etc? No  Adequate lighting in your home to reduce risk of falls? Yes   ASSISTIVE DEVICES UTILIZED TO PREVENT FALLS:  Life alert? No  Use of a cane, walker or w/c? Yes  Grab bars in the bathroom? Yes  Shower chair or bench in shower? Yes  Elevated toilet seat or a handicapped toilet? Yes   TIMED UP AND GO:  Was the test performed?  NO televisit  .  Cognitive Function:        04/02/2023    2:31 PM 03/27/2022   10:36 AM 03/24/2021   11:04 AM 02/04/2020   12:04 PM 11/08/2016   11:53 AM  6CIT Screen  What Year? 0 points 0 points 0 points 0 points 0 points  What month? 0 points 0 points 0 points 0 points 0 points  What time? 0 points 0 points 0 points 0 points 0 points  Count back from 20 0 points  0 points 0 points 0 points  Months in reverse 0 points 0 points 0 points 0 points 0 points  Repeat phrase 0 points      Total Score 0 points        Immunizations Immunization History  Administered Date(s) Administered   Fluad Quad(high Dose 65+) 07/28/2019, 10/03/2020   Influenza,  High Dose Seasonal PF 11/08/2016, 09/26/2017, 10/14/2018   Influenza,inj,Quad PF,6+ Mos 10/07/2013, 07/18/2015   PFIZER(Purple Top)SARS-COV-2 Vaccination 01/15/2020, 02/10/2020, 11/01/2020   Pneumococcal Conjugate-13 09/26/2017   Pneumococcal Polysaccharide-23 09/24/2007    TDAP status: Due, Education has been provided regarding the importance of this vaccine. Advised may receive this vaccine at local pharmacy or Health Dept. Aware to provide a copy of the vaccination record if obtained from local pharmacy or Health Dept. Verbalized acceptance and understanding.  Flu Vaccine status: Up to date  Pneumococcal vaccine status: Up to date  Covid-19 vaccine status: Completed vaccines  Qualifies for Shingles Vaccine? Yes   Zostavax completed No   Shingrix Completed?: No.    Education has been provided regarding the importance of this vaccine. Patient has been advised to call insurance company to determine out of pocket expense if they have not yet received this vaccine. Advised may also receive vaccine at local pharmacy or Health Dept. Verbalized acceptance and understanding.  Screening Tests Health Maintenance  Topic Date Due   DTaP/Tdap/Td (1 - Tdap) Never done  COVID-19 Vaccine (4 - 2023-24 season) 07/20/2022   OPHTHALMOLOGY EXAM  01/03/2023   FOOT EXAM  03/14/2023   Zoster Vaccines- Shingrix (1 of 2) 04/30/2024 (Originally 06/12/1951)   INFLUENZA VACCINE  06/20/2023   HEMOGLOBIN A1C  06/24/2023   MAMMOGRAM  07/17/2023   Medicare Annual Wellness (AWV)  04/01/2024   Pneumonia Vaccine 43+ Years old  Completed   HPV VACCINES  Aged Out   DEXA SCAN  Discontinued    Health Maintenance  Health Maintenance Due  Topic Date Due   DTaP/Tdap/Td (1 - Tdap) Never done   COVID-19 Vaccine (4 - 2023-24 season) 07/20/2022   OPHTHALMOLOGY EXAM  01/03/2023   FOOT EXAM  03/14/2023    Colorectal cancer screening: No longer required.   Mammogram status: Completed 07/16/22. Repeat every  year  Bone Density status: Completed 03/24/22. Results reflect: Bone density results: OSTEOPENIA. Repeat every 10 years.  Lung Cancer Screening: (Low Dose CT Chest recommended if Age 55-80 years, 30 pack-year currently smoking OR have quit w/in 15years.) does not qualify.    Additional Screening:  Hepatitis C Screening: does not qualify;due to age   Vision Screening: Recommended annual ophthalmology exams for early detection of glaucoma and other disorders of the eye. Is the patient up to date with their annual eye exam?  Yes  Who is the provider or what is the name of the office in which the patient attends annual eye exams? [facility] If pt is not established with a provider, would they like to be referred to a provider to establish care? No .   Dental Screening: Recommended annual dental exams for proper oral hygiene  Community Resource Referral / Chronic Care Management: CRR required this visit?  No   CCM required this visit?  No      Plan:     I have personally reviewed and noted the following in the patient's chart:   Medical and social history Use of alcohol, tobacco or illicit drugs  Current medications and supplements including opioid prescriptions. Patient is not currently taking opioid prescriptions. Functional ability and status Nutritional status Physical activity Advanced directives List of other physicians Hospitalizations, surgeries, and ER visits in previous 12 months Vitals Screenings to include cognitive, depression, and falls Referrals and appointments  In addition, I have reviewed and discussed with patient certain preventive protocols, quality metrics, and best practice recommendations. A written personalized care plan for preventive services as well as general preventive health recommendations were provided to patient.     Annabell Sabal, CMA   04/02/2023   Nurse Notes: none     I have reviewed the above information and agree with  above.   Duncan Dull, MD

## 2023-04-22 MED FILL — Ferumoxytol Inj 510 MG/17ML (30 MG/ML) (Elemental Fe): INTRAVENOUS | Qty: 17 | Status: AC

## 2023-04-23 ENCOUNTER — Inpatient Hospital Stay: Payer: Medicare Other

## 2023-04-23 ENCOUNTER — Inpatient Hospital Stay: Payer: Medicare Other | Admitting: Internal Medicine

## 2023-04-29 ENCOUNTER — Ambulatory Visit: Payer: Medicare Other | Admitting: Internal Medicine

## 2023-05-13 ENCOUNTER — Inpatient Hospital Stay: Payer: Medicare Other

## 2023-05-13 ENCOUNTER — Encounter: Payer: Self-pay | Admitting: Internal Medicine

## 2023-05-13 ENCOUNTER — Inpatient Hospital Stay (HOSPITAL_BASED_OUTPATIENT_CLINIC_OR_DEPARTMENT_OTHER): Payer: Medicare Other | Admitting: Internal Medicine

## 2023-05-13 ENCOUNTER — Inpatient Hospital Stay: Payer: Medicare Other | Attending: Internal Medicine

## 2023-05-13 VITALS — BP 99/54 | HR 82 | Temp 97.7°F | Wt 140.5 lb

## 2023-05-13 DIAGNOSIS — Z8673 Personal history of transient ischemic attack (TIA), and cerebral infarction without residual deficits: Secondary | ICD-10-CM | POA: Diagnosis not present

## 2023-05-13 DIAGNOSIS — N183 Chronic kidney disease, stage 3 unspecified: Secondary | ICD-10-CM | POA: Insufficient documentation

## 2023-05-13 DIAGNOSIS — Z7902 Long term (current) use of antithrombotics/antiplatelets: Secondary | ICD-10-CM | POA: Insufficient documentation

## 2023-05-13 DIAGNOSIS — D631 Anemia in chronic kidney disease: Secondary | ICD-10-CM | POA: Diagnosis present

## 2023-05-13 DIAGNOSIS — D649 Anemia, unspecified: Secondary | ICD-10-CM

## 2023-05-13 LAB — CBC WITH DIFFERENTIAL (CANCER CENTER ONLY)
Abs Immature Granulocytes: 0.01 10*3/uL (ref 0.00–0.07)
Basophils Absolute: 0.1 10*3/uL (ref 0.0–0.1)
Basophils Relative: 1 %
Eosinophils Absolute: 0.2 10*3/uL (ref 0.0–0.5)
Eosinophils Relative: 4 %
HCT: 26.8 % — ABNORMAL LOW (ref 36.0–46.0)
Hemoglobin: 8.3 g/dL — ABNORMAL LOW (ref 12.0–15.0)
Immature Granulocytes: 0 %
Lymphocytes Relative: 17 %
Lymphs Abs: 1 10*3/uL (ref 0.7–4.0)
MCH: 32.8 pg (ref 26.0–34.0)
MCHC: 31 g/dL (ref 30.0–36.0)
MCV: 105.9 fL — ABNORMAL HIGH (ref 80.0–100.0)
Monocytes Absolute: 0.3 10*3/uL (ref 0.1–1.0)
Monocytes Relative: 5 %
Neutro Abs: 4.4 10*3/uL (ref 1.7–7.7)
Neutrophils Relative %: 73 %
Platelet Count: 202 10*3/uL (ref 150–400)
RBC: 2.53 MIL/uL — ABNORMAL LOW (ref 3.87–5.11)
RDW: 14.6 % (ref 11.5–15.5)
WBC Count: 6 10*3/uL (ref 4.0–10.5)
nRBC: 0 % (ref 0.0–0.2)

## 2023-05-13 LAB — BASIC METABOLIC PANEL - CANCER CENTER ONLY
Anion gap: 7 (ref 5–15)
BUN: 21 mg/dL (ref 8–23)
CO2: 23 mmol/L (ref 22–32)
Calcium: 8.5 mg/dL — ABNORMAL LOW (ref 8.9–10.3)
Chloride: 107 mmol/L (ref 98–111)
Creatinine: 1.24 mg/dL — ABNORMAL HIGH (ref 0.44–1.00)
GFR, Estimated: 42 mL/min — ABNORMAL LOW (ref >60)
Glucose, Bld: 191 mg/dL — ABNORMAL HIGH (ref 70–99)
Potassium: 3.9 mmol/L (ref 3.5–5.1)
Sodium: 137 mmol/L (ref 135–145)

## 2023-05-13 MED ORDER — CYANOCOBALAMIN 1000 MCG/ML IJ SOLN
1000.0000 ug | Freq: Once | INTRAMUSCULAR | Status: AC
  Administered 2023-05-13: 1000 ug via INTRAMUSCULAR
  Filled 2023-05-13: qty 1

## 2023-05-13 MED ORDER — EPOETIN ALFA-EPBX 40000 UNIT/ML IJ SOLN
40000.0000 [IU] | Freq: Once | INTRAMUSCULAR | Status: AC
  Administered 2023-05-13: 40000 [IU] via SUBCUTANEOUS
  Filled 2023-05-13: qty 1

## 2023-05-13 MED FILL — Ferumoxytol Inj 510 MG/17ML (30 MG/ML) (Elemental Fe): INTRAVENOUS | Qty: 17 | Status: AC

## 2023-05-13 NOTE — Progress Notes (Signed)
Fatigue/weakness: no Dyspena: no Light headedness: no Blood in stool:  no 

## 2023-05-13 NOTE — Progress Notes (Signed)
[facility] OFFICE PROGRESS NOTE  Patient Care Team: Dale Prado Verde, MD as PCP - General (Internal Medicine) Dale Ontario, MD (Internal Medicine) Marcina Millard, MD (Internal Medicine) Willeen Niece, MD (Specialist) Schnier, Latina Craver, MD (Vascular Surgery) Marcina Millard, MD (Internal Medicine) Willeen Niece, MD (Specialist) Schnier, Latina Craver, MD (Vascular Surgery) Earna Coder, MD as Consulting Physician (Oncology)   SUMMARY OF HEMATOLOGIC HISTORY:  # IRON DEFICIENCY ANEMIA- recurrent [? GI blood loss; Dr.Elliot; AVM-capsule]; IV Ferrahem q 12m; last colo- 2012/march; EGD- [id number].  July 2020 bone marrow biopsy-mild dysplastic changes; 40% hypercellularity; cytogenetics-normal.-Retacrit.MARCH 2023- Foundation One Hem- TP53 subclonal. B12 def- FEB 2023- Start B12 injections Monthly; MAY 3rd, 2023- Start Retacrit 20K [place].   #Colonoscopy-April 2020 aborted because of cardiac pause.  # CKD stage III-IV; CAD-Dr. Darrold Junker;  March 30th, 2022-TIA/slurred spleech [Dr.Shah]  INTERVAL HISTORY: In a wheelchair.  With her daughter.   89 year old female patient with above history of recurrent iron deficiency anemia unclear etiology/?-related to CKD-on Feraheme/Retacrit is here for follow-up.   Mild fatigue. Notes of dyspnea. Light headedness: no; Blood in stool: no.    Denies dizziness or fatigue. No visible bleeding. Patient denies any nausea vomiting.   Review of Systems  Constitutional:  Positive for malaise/fatigue. Negative for chills, diaphoresis, fever and weight loss.  HENT:  Negative for nosebleeds and sore throat.   Eyes:  Negative for double vision.  Respiratory:  Negative for cough, hemoptysis, sputum production and wheezing.   Cardiovascular:  Negative for palpitations, orthopnea and leg swelling.  Gastrointestinal:  Negative for abdominal pain, blood in stool, constipation, diarrhea, heartburn, melena, nausea and vomiting.   Genitourinary:  Negative for dysuria, frequency and urgency.  Musculoskeletal:  Negative for back pain and joint pain.  Skin: Negative.  Negative for itching and rash.  Neurological:  Negative for dizziness, tingling, focal weakness, weakness and headaches.  Endo/Heme/Allergies:  Does not bruise/bleed easily.  Psychiatric/Behavioral:  Negative for depression. The patient is not nervous/anxious and does not have insomnia.      PAST MEDICAL HISTORY :  Past Medical History:  Diagnosis Date   Arthritis    AV malformation of gastrointestinal tract    Blood in stool    CAD (coronary artery disease)    Carotid arterial disease (HCC)    Cataracts, bilateral    Chronic blood loss anemia    Chronic cystitis    CKD (chronic kidney disease), stage III (HCC)    Complication of anesthesia    reaction to propofol - caused heart to pause   Depression    Diabetes mellitus (HCC)    GERD (gastroesophageal reflux disease)    Hyperlipidemia    Hypertension    IDA (iron deficiency anemia)    Stress incontinence    TIA (transient ischemic attack) 03/2021   No deficits   Wears dentures    full upper and lower    PAST SURGICAL HISTORY :   Past Surgical History:  Procedure Laterality Date   ABDOMINAL HYSTERECTOMY  11/19/1970   ovaries left in place   APPENDECTOMY  11/19/1990   BACK SURGERY  06/08/2010   L3, L4, L5   CARDIAC CATHETERIZATION  02/2001   CAROTID ARTERY ANGIOPLASTY  09/19/2002   CARPAL TUNNEL RELEASE  1991, 92   right then left    CATARACT EXTRACTION W/PHACO Left 02/13/2022   Procedure: CATARACT EXTRACTION PHACO AND INTRAOCULAR LENS PLACEMENT (IOC) LEFT DIABETIC 14.90 01:26.8;  Surgeon: Galen Manila, MD;  Location: [facility];  Service: Ophthalmology;  Laterality: Left;  Diabetic   CATARACT EXTRACTION W/PHACO Right 03/06/2022   Procedure: CATARACT EXTRACTION PHACO AND INTRAOCULAR LENS PLACEMENT (IOC) RIGHT DIABETIC 9.66 00:53.7;  Surgeon: Galen Manila, MD;   Location: [facility];  Service: Ophthalmology;  Laterality: Right;  Diabetic   CHOLECYSTECTOMY  11/20/1991   COLONOSCOPY WITH PROPOFOL N/A 02/02/2019   Procedure: COLONOSCOPY WITH PROPOFOL;  Surgeon: Scot Jun, MD;  Location: [facility] END[facility];  Service: Endoscopy;  Laterality: N/A;   ESOPHAGOGASTRODUODENOSCOPY N/A 02/02/2019   Procedure: ESOPHAGOGASTRODUODENOSCOPY (EGD);  Surgeon: Scot Jun, MD;  Location: [facility] ENDOSCOPY;  Service: Endoscopy;  Laterality: N/A;   FOOT SURGERY  06/20/2007   Left   right carotid artery surgery  01/09/2013   Dr. Gilda Crease @ [facility]   TOTAL HIP ARTHROPLASTY  05/03/2011   left 12, right 11/07    FAMILY HISTORY :   Family History  Problem Relation Age of Onset   Hyperlipidemia Father    Heart disease Father        myocardial infarction   Hypertension Father        Parent   Arthritis Other        parent   Diabetes Other        nephew   Cervical cancer Sister    Rectal cancer Sister    Breast cancer Neg Hx     SOCIAL HISTORY:   Social History   Tobacco Use   Smoking status: Former    Types: Cigarettes    Quit date: 11/19/1989    Years since quitting: 33.5   Smokeless tobacco: Never  Vaping Use   Vaping Use: Never used  Substance Use Topics   Alcohol use: No    Alcohol/week: 0.0 standard drinks of alcohol   Drug use: No    ALLERGIES:  is allergic to propofol, ciprofloxacin, levaquin [levofloxacin], and tequin [gatifloxacin].  MEDICATIONS:  Current Outpatient Medications  Medication Sig Dispense Refill   aspirin 81 MG tablet Take 81 mg by mouth daily.     Biotin 5000 MCG CAPS Take 1 capsule by mouth daily.     cetirizine (ZYRTEC) 10 MG tablet Take 10 mg by mouth daily.     docusate sodium (COLACE) 100 MG capsule Take 100 mg by mouth daily as needed for mild constipation.     ferrous sulfate 325 (65 FE) MG EC tablet Take 325 mg by mouth every other day. Every other day     guaiFENesin-dextromethorphan (ROBITUSSIN DM)  100-10 MG/5ML syrup Take 5 mLs by mouth every 4 (four) hours as needed for cough. 118 mL 0   Lancets (ONETOUCH DELICA PLUS LANCET30G) MISC USE AS INSTRUCTED TO CHECK BLOOD SUGARS ONCE DAILY. DX E 11.9 100 each 12   Omega-3 Fatty Acids (FISH OIL) 1200 MG CAPS Take 1 capsule by mouth daily.     Probiotic, Lactobacillus, CAPS Take 1 capsule by mouth daily.     rosuvastatin (CRESTOR) 40 MG tablet TAKE 1 TABLET BY MOUTH EVERY DAY (Patient taking differently: Take 40 mg by mouth daily.) 90 tablet 1   traZODone (DESYREL) 50 MG tablet TAKE 1/2 TO 1 TABLET BY MOUTH AT BEDTIME AS NEEDED FOR SLEEP (Patient taking differently: Take 25-50 mg by mouth at bedtime as needed for sleep. for sleep) 90 tablet 1   triamcinolone cream (KENALOG) 0.1 % Apply 1 Application topically 2 (two) times daily. On affected area on leg.  Use for 7-10 days only - then stop. 30 g 0   No current facility-administered medications for  this visit.    PHYSICAL EXAMINATION:   BP (!) 99/54 (BP Location: Left Arm, Patient Position: Sitting, Cuff Size: Normal)   Pulse 82   Temp 97.7 F (36.5 C) (Tympanic)   Wt 140 lb 8 oz (63.7 kg)   SpO2 96%   BMI 28.38 kg/m   Filed Weights   05/13/23 1420  Weight: 140 lb 8 oz (63.7 kg)     Physical Exam Constitutional:      Comments: Frail-appearing Caucasian female patient.  She is in a wheelchair.  HENT:     Head: Normocephalic and atraumatic.     Mouth/Throat:     Pharynx: No oropharyngeal exudate.  Eyes:     Pupils: Pupils are equal, round, and reactive to light.     Comments: Positive for pallor.  Cardiovascular:     Rate and Rhythm: Normal rate and regular rhythm.  Pulmonary:     Effort: No respiratory distress.     Breath sounds: No wheezing.  Abdominal:     General: Bowel sounds are normal. There is no distension.     Palpations: Abdomen is soft. There is no mass.     Tenderness: There is no abdominal tenderness. There is no guarding or rebound.  Musculoskeletal:         General: No tenderness. Normal range of motion.     Cervical back: Normal range of motion and neck supple.  Skin:    General: Skin is warm.  Neurological:     Mental Status: She is alert and oriented to person, place, and time.  Psychiatric:        Mood and Affect: Affect normal.     LABORATORY DATA:  I have reviewed the data as listed    Component Value Date/Time   NA 137 05/13/2023 1416   NA 143 01/10/2013 0314   K 3.9 05/13/2023 1416   K 4.0 01/10/2013 0314   CL 107 05/13/2023 1416   CL 109 (H) 01/10/2013 0314   CO2 23 05/13/2023 1416   CO2 27 01/10/2013 0314   GLUCOSE 191 (H) 05/13/2023 1416   GLUCOSE 119 (H) 01/10/2013 0314   BUN 21 05/13/2023 1416   BUN 15 01/10/2013 0314   CREATININE 1.24 (H) 05/13/2023 1416   CREATININE 1.03 01/10/2013 0314   CREATININE 1.13 (H) 09/18/2012 0848   CALCIUM 8.5 (L) 05/13/2023 1416   CALCIUM 8.1 (L) 01/10/2013 0314   PROT 6.7 12/24/2022 1521   PROT 7.2 12/20/2011 1521   ALBUMIN 4.2 12/24/2022 1521   ALBUMIN 3.9 12/20/2011 1521   AST 36 12/24/2022 1521   AST 31 12/20/2011 1521   ALT 28 12/24/2022 1521   ALT 37 12/20/2011 1521   ALKPHOS 57 12/24/2022 1521   ALKPHOS 57 12/20/2011 1521   BILITOT 0.4 12/24/2022 1521   BILITOT 0.5 12/20/2011 1521   GFRNONAA 42 (L) 05/13/2023 1416   GFRNONAA 52 (L) 01/10/2013 0314   GFRNONAA 47 (L) 09/18/2012 0848   GFRAA 46 (L) 07/12/2020 1240   GFRAA >60 01/10/2013 0314   GFRAA 54 (L) 09/18/2012 0848    No results found for: "SPEP", "UPEP"  Lab Results  Component Value Date   WBC 6.0 05/13/2023   NEUTROABS 4.4 05/13/2023   HGB 8.3 (L) 05/13/2023   HCT 26.8 (L) 05/13/2023   MCV 105.9 (H) 05/13/2023   PLT 202 05/13/2023      Chemistry      Component Value Date/Time   NA 137 05/13/2023 1416   NA 143 01/10/2013 0314  K 3.9 05/13/2023 1416   K 4.0 01/10/2013 0314   CL 107 05/13/2023 1416   CL 109 (H) 01/10/2013 0314   CO2 23 05/13/2023 1416   CO2 27 01/10/2013 0314   BUN 21  05/13/2023 1416   BUN 15 01/10/2013 0314   CREATININE 1.24 (H) 05/13/2023 1416   CREATININE 1.03 01/10/2013 0314   CREATININE 1.13 (H) 09/18/2012 0848      Component Value Date/Time   CALCIUM 8.5 (L) 05/13/2023 1416   CALCIUM 8.1 (L) 01/10/2013 0314   ALKPHOS 57 12/24/2022 1521   ALKPHOS 57 12/20/2011 1521   AST 36 12/24/2022 1521   AST 31 12/20/2011 1521   ALT 28 12/24/2022 1521   ALT 37 12/20/2011 1521   BILITOT 0.4 12/24/2022 1521   BILITOT 0.5 12/20/2011 1521      ASSESSMENT & PLAN:   Anemia of chronic kidney failure, stage 3 (moderate) (HCC) #Severe anemia hemoglobin-nadir 6 [summer 2020]. 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 JUNE 2023- NEGATIVE for hemolysis [haptoglobin-WNL]  # Today hemoglobin is  8.3 ; Proceed with Retacrit today.- MARCH 2024-  I sat-27 ferritin-71[name]- monthly; iron studies/ferritin- pending. Plan Ferrahem in 1 week.   # B12 def- FEB 2023- 232; continue B12 injections every monthly. [march 2023] ; B12 levels - 723 [NOV 2023] Proceed with injection today.  Stable.   # CKD stage III- Recommend continued p.o. intake.  Stable.   # TIA: on plavix [Dr.Shah]- stable.   Monthly B12; Ferra/retacrit q m  # DISPOSITION:  # HOLD Ferrahem today;  # proceed with RETACRIT;  B 12 injection # Ferrahem in 1 week  # in 4 weeks-  MD-  labs- -cbc;bmp;  possible Ferrahem or possible retacrit; B12 injection- Dr.B   Earna Coder, MD 05/13/2023 3:54 PM

## 2023-05-13 NOTE — Assessment & Plan Note (Addendum)
#  Severe anemia hemoglobin-nadir 6 [summer 2020]. 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 JUNE 2023- NEGATIVE for hemolysis [haptoglobin-WNL]  # Today hemoglobin is  8.3 ; Proceed with Retacrit today.- MARCH 2024-  I sat-27 ferritin-71[name]- monthly; iron studies/ferritin- pending. Plan Ferrahem in 1 week.   # B12 def- FEB 2023- 232; continue B12 injections every monthly. [march 2023] ; B12 levels - 723 [NOV 2023] Proceed with injection today.  Stable.   # CKD stage III- Recommend continued p.o. intake.  Stable.   # TIA: on plavix [Dr.Shah]- stable.   Monthly B12; Ferra/retacrit q m  # DISPOSITION:  # HOLD Ferrahem today;  # proceed with RETACRIT;  B 12 injection # Ferrahem in 1 week  # in 4 weeks-  MD-  labs- -cbc;bmp;  possible Ferrahem or possible retacrit; B12 injection- Dr.B

## 2023-05-20 ENCOUNTER — Inpatient Hospital Stay: Payer: Medicare Other | Attending: Internal Medicine

## 2023-05-20 VITALS — BP 113/58 | HR 76 | Temp 97.5°F | Resp 16

## 2023-05-20 DIAGNOSIS — D631 Anemia in chronic kidney disease: Secondary | ICD-10-CM | POA: Insufficient documentation

## 2023-05-20 DIAGNOSIS — D508 Other iron deficiency anemias: Secondary | ICD-10-CM

## 2023-05-20 DIAGNOSIS — N183 Chronic kidney disease, stage 3 unspecified: Secondary | ICD-10-CM | POA: Insufficient documentation

## 2023-05-20 MED ORDER — SODIUM CHLORIDE 0.9 % IV SOLN
510.0000 mg | Freq: Once | INTRAVENOUS | Status: AC
  Administered 2023-05-20: 510 mg via INTRAVENOUS
  Filled 2023-05-20: qty 510

## 2023-05-20 MED ORDER — SODIUM CHLORIDE 0.9 % IV SOLN
Freq: Once | INTRAVENOUS | Status: AC
  Filled 2023-05-20: qty 250

## 2023-05-21 ENCOUNTER — Ambulatory Visit: Payer: Medicare Other | Admitting: Internal Medicine

## 2023-06-10 ENCOUNTER — Encounter: Payer: Self-pay | Admitting: Internal Medicine

## 2023-06-10 ENCOUNTER — Inpatient Hospital Stay: Payer: Medicare Other | Admitting: Internal Medicine

## 2023-06-10 ENCOUNTER — Inpatient Hospital Stay: Payer: Medicare Other

## 2023-06-10 VITALS — BP 115/61 | HR 86 | Temp 97.9°F | Resp 19 | Wt 140.2 lb

## 2023-06-10 DIAGNOSIS — D631 Anemia in chronic kidney disease: Secondary | ICD-10-CM

## 2023-06-10 DIAGNOSIS — N183 Chronic kidney disease, stage 3 unspecified: Secondary | ICD-10-CM | POA: Diagnosis not present

## 2023-06-10 DIAGNOSIS — D649 Anemia, unspecified: Secondary | ICD-10-CM

## 2023-06-10 LAB — CBC WITH DIFFERENTIAL (CANCER CENTER ONLY)
Abs Immature Granulocytes: 0.03 10*3/uL (ref 0.00–0.07)
Basophils Absolute: 0 10*3/uL (ref 0.0–0.1)
Basophils Relative: 1 %
Eosinophils Absolute: 0.2 10*3/uL (ref 0.0–0.5)
Eosinophils Relative: 3 %
HCT: 28.5 % — ABNORMAL LOW (ref 36.0–46.0)
Hemoglobin: 8.7 g/dL — ABNORMAL LOW (ref 12.0–15.0)
Immature Granulocytes: 1 %
Lymphocytes Relative: 18 %
Lymphs Abs: 1.1 10*3/uL (ref 0.7–4.0)
MCH: 33.5 pg (ref 26.0–34.0)
MCHC: 30.5 g/dL (ref 30.0–36.0)
MCV: 109.6 fL — ABNORMAL HIGH (ref 80.0–100.0)
Monocytes Absolute: 0.3 10*3/uL (ref 0.1–1.0)
Monocytes Relative: 5 %
Neutro Abs: 4.2 10*3/uL (ref 1.7–7.7)
Neutrophils Relative %: 72 %
Platelet Count: 156 10*3/uL (ref 150–400)
RBC: 2.6 MIL/uL — ABNORMAL LOW (ref 3.87–5.11)
RDW: 15.9 % — ABNORMAL HIGH (ref 11.5–15.5)
WBC Count: 5.8 10*3/uL (ref 4.0–10.5)
nRBC: 0 % (ref 0.0–0.2)

## 2023-06-10 LAB — BASIC METABOLIC PANEL
Anion gap: 7 (ref 5–15)
BUN: 26 mg/dL — ABNORMAL HIGH (ref 8–23)
CO2: 26 mmol/L (ref 22–32)
Calcium: 8.8 mg/dL — ABNORMAL LOW (ref 8.9–10.3)
Chloride: 105 mmol/L (ref 98–111)
Creatinine, Ser: 1.32 mg/dL — ABNORMAL HIGH (ref 0.44–1.00)
GFR, Estimated: 39 mL/min — ABNORMAL LOW (ref >60)
Glucose, Bld: 165 mg/dL — ABNORMAL HIGH (ref 70–99)
Potassium: 4.3 mmol/L (ref 3.5–5.1)
Sodium: 138 mmol/L (ref 135–145)

## 2023-06-10 LAB — FERRITIN: Ferritin: 42 ng/mL (ref 11–307)

## 2023-06-10 LAB — IRON AND TIBC
Iron: 72 ug/dL (ref 28–170)
Saturation Ratios: 22 % (ref 10.4–31.8)
TIBC: 333 ug/dL (ref 250–450)
UIBC: 261 ug/dL

## 2023-06-10 MED ORDER — CYANOCOBALAMIN 1000 MCG/ML IJ SOLN
1000.0000 ug | Freq: Once | INTRAMUSCULAR | Status: AC
  Administered 2023-06-10: 1000 ug via INTRAMUSCULAR
  Filled 2023-06-10: qty 1

## 2023-06-10 MED ORDER — EPOETIN ALFA-EPBX 40000 UNIT/ML IJ SOLN
40000.0000 [IU] | Freq: Once | INTRAMUSCULAR | Status: AC
  Administered 2023-06-10: 40000 [IU] via SUBCUTANEOUS
  Filled 2023-06-10: qty 1

## 2023-06-10 NOTE — Addendum Note (Signed)
Addended by: Darrold Span A on: 06/10/2023 04:32 PM   Modules accepted: Orders

## 2023-06-10 NOTE — Assessment & Plan Note (Addendum)
#  Severe anemia hemoglobin-nadir 6 [summer 2020]. 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 JUNE 2023- NEGATIVE for hemolysis [haptoglobin-WNL]  # Today hemoglobin is  8.3 ; Proceed with Retacrit today.- MARCH 2024-  I sat-27 ferritin-71[name]- monthly; iron studies/ferritin- pending. Plan Ferrahem in 1 week.   # B12 def- FEB 2023- 232; continue B12 injections every monthly. [march 2023] ; B12 levels - 723 [NOV 2023] Proceed with injection today.  Stable.   # CKD stage III- Recommend continued p.o. intake.  Stable.   # TIA: on plavix [Dr.Shah]- stable.   Monthly B12; Ferra/retacrit q m  # DISPOSITION:  # HOLD Ferrahem today;  # proceed with RETACRIT;  B 12 injection # Ferrahem in 2 week  # in 4 weeks-  MD-  labs- -cbc;bmp; iron studies; ferritin- possible Ferrahem or possible retacrit; B12 injection- Dr.B

## 2023-06-10 NOTE — Progress Notes (Signed)
Patient has no concerns 

## 2023-06-10 NOTE — Progress Notes (Signed)
HOLD Ferrahem today;  # proceed with RETACRIT;  B 12 injection

## 2023-06-10 NOTE — Progress Notes (Signed)
[facility] OFFICE PROGRESS NOTE  Patient Care Team: Dale Ravenwood, MD as PCP - General (Internal Medicine) Dale [place], MD (Internal Medicine) Marcina Millard, MD (Internal Medicine) Willeen Niece, MD (Specialist) Schnier, Latina Craver, MD (Vascular Surgery) Marcina Millard, MD (Internal Medicine) Willeen Niece, MD (Specialist) Schnier, Latina Craver, MD (Vascular Surgery) Earna Coder, MD as Consulting Physician (Oncology)   SUMMARY OF HEMATOLOGIC HISTORY:  # IRON DEFICIENCY ANEMIA- recurrent [? GI blood loss; Dr.Elliot; AVM-capsule]; IV Ferrahem q 26m; last colo- 2012/march; EGD- [id number].  July 2020 bone marrow biopsy-mild dysplastic changes; 40% hypercellularity; cytogenetics-normal.-Retacrit.MARCH 2023- Foundation One Hem- TP53 subclonal. B12 def- FEB 2023- Start B12 injections Monthly; MAY 3rd, 2023- Start Retacrit 20K [place].   #Colonoscopy-April 2020 aborted because of cardiac pause.  # CKD stage III-IV; CAD-Dr. Darrold Junker;  March 30th, 2022-TIA/slurred spleech [Dr.Shah]  INTERVAL HISTORY: In a wheelchair.  With her daughter.   89 year old female patient with above history of recurrent iron deficiency anemia unclear etiology/?-related to CKD-on Feraheme/Retacrit is here for follow-up.  Mild fatigue. Notes of dyspnea. Light headedness: no; Blood in stool: no.    Denies dizziness or fatigue. No visible bleeding. Patient denies any nausea vomiting.   Review of Systems  Constitutional:  Positive for malaise/fatigue. Negative for chills, diaphoresis, fever and weight loss.  HENT:  Negative for nosebleeds and sore throat.   Eyes:  Negative for double vision.  Respiratory:  Negative for cough, hemoptysis, sputum production and wheezing.   Cardiovascular:  Negative for palpitations, orthopnea and leg swelling.  Gastrointestinal:  Negative for abdominal pain, blood in stool, constipation, diarrhea, heartburn, melena, nausea and vomiting.   Genitourinary:  Negative for dysuria, frequency and urgency.  Musculoskeletal:  Negative for back pain and joint pain.  Skin: Negative.  Negative for itching and rash.  Neurological:  Negative for dizziness, tingling, focal weakness, weakness and headaches.  Endo/Heme/Allergies:  Does not bruise/bleed easily.  Psychiatric/Behavioral:  Negative for depression. The patient is not nervous/anxious and does not have insomnia.      PAST MEDICAL HISTORY :  Past Medical History:  Diagnosis Date   Arthritis    AV malformation of gastrointestinal tract    Blood in stool    CAD (coronary artery disease)    Carotid arterial disease (HCC)    Cataracts, bilateral    Chronic blood loss anemia    Chronic cystitis    CKD (chronic kidney disease), stage III (HCC)    Complication of anesthesia    reaction to propofol - caused heart to pause   Depression    Diabetes mellitus (HCC)    GERD (gastroesophageal reflux disease)    Hyperlipidemia    Hypertension    IDA (iron deficiency anemia)    Stress incontinence    TIA (transient ischemic attack) 03/2021   No deficits   Wears dentures    full upper and lower    PAST SURGICAL HISTORY :   Past Surgical History:  Procedure Laterality Date   ABDOMINAL HYSTERECTOMY  11/19/1970   ovaries left in place   APPENDECTOMY  11/19/1990   BACK SURGERY  06/08/2010   L3, L4, L5   CARDIAC CATHETERIZATION  02/2001   CAROTID ARTERY ANGIOPLASTY  09/19/2002   CARPAL TUNNEL RELEASE  1991, 92   right then left    CATARACT EXTRACTION W/PHACO Left 02/13/2022   Procedure: CATARACT EXTRACTION PHACO AND INTRAOCULAR LENS PLACEMENT (IOC) LEFT DIABETIC 14.90 01:26.8;  Surgeon: Galen Manila, MD;  Location: [facility];  Service: Ophthalmology;  Laterality: Left;  Diabetic   CATARACT EXTRACTION W/PHACO Right 03/06/2022   Procedure: CATARACT EXTRACTION PHACO AND INTRAOCULAR LENS PLACEMENT (IOC) RIGHT DIABETIC 9.66 00:53.7;  Surgeon: Galen Manila, MD;   Location: [facility];  Service: Ophthalmology;  Laterality: Right;  Diabetic   CHOLECYSTECTOMY  11/20/1991   COLONOSCOPY WITH PROPOFOL N/A 02/02/2019   Procedure: COLONOSCOPY WITH PROPOFOL;  Surgeon: Scot Jun, MD;  Location: [facility];  Service: Endoscopy;  Laterality: N/A;   ESOPHAGOGASTRODUODENOSCOPY N/A 02/02/2019   Procedure: ESOPHAGOGASTRODUODENOSCOPY (EGD);  Surgeon: Scot Jun, MD;  Location: [facility] END[facility];  Service: Endoscopy;  Laterality: N/A;   FOOT SURGERY  06/20/2007   Left   right carotid artery surgery  01/09/2013   Dr. Gilda Crease @ [facility]   TOTAL HIP ARTHROPLASTY  05/03/2011   left 12, right 11/07    FAMILY HISTORY :   Family History  Problem Relation Age of Onset   Hyperlipidemia Father    Heart disease Father        myocardial infarction   Hypertension Father        Parent   Arthritis Other        parent   Diabetes Other        nephew   Cervical cancer Sister    Rectal cancer Sister    Breast cancer Neg Hx     SOCIAL HISTORY:   Social History   Tobacco Use   Smoking status: Former    Current packs/day: 0.00    Types: Cigarettes    Quit date: 11/19/1989    Years since quitting: 33.5   Smokeless tobacco: Never  Vaping Use   Vaping status: Never Used  Substance Use Topics   Alcohol use: No    Alcohol/week: 0.0 standard drinks of alcohol   Drug use: No    ALLERGIES:  is allergic to propofol, ciprofloxacin, levaquin [levofloxacin], and tequin [gatifloxacin].  MEDICATIONS:  Current Outpatient Medications  Medication Sig Dispense Refill   aspirin 81 MG tablet Take 81 mg by mouth daily.     Biotin 5000 MCG CAPS Take 1 capsule by mouth daily.     cetirizine (ZYRTEC) 10 MG tablet Take 10 mg by mouth daily.     docusate sodium (COLACE) 100 MG capsule Take 100 mg by mouth daily as needed for mild constipation.     ferrous sulfate 325 (65 FE) MG EC tablet Take 325 mg by mouth every other day. Every other day      guaiFENesin-dextromethorphan (ROBITUSSIN DM) 100-10 MG/5ML syrup Take 5 mLs by mouth every 4 (four) hours as needed for cough. 118 mL 0   Lancets (ONETOUCH DELICA PLUS LANCET30G) MISC USE AS INSTRUCTED TO CHECK BLOOD SUGARS ONCE DAILY. DX E 11.9 100 each 12   Omega-3 Fatty Acids (FISH OIL) 1200 MG CAPS Take 1 capsule by mouth daily.     Probiotic, Lactobacillus, CAPS Take 1 capsule by mouth daily.     rosuvastatin (CRESTOR) 40 MG tablet TAKE 1 TABLET BY MOUTH EVERY DAY (Patient taking differently: Take 40 mg by mouth daily.) 90 tablet 1   traZODone (DESYREL) 50 MG tablet TAKE 1/2 TO 1 TABLET BY MOUTH AT BEDTIME AS NEEDED FOR SLEEP (Patient taking differently: Take 25-50 mg by mouth at bedtime as needed for sleep. for sleep) 90 tablet 1   triamcinolone cream (KENALOG) 0.1 % Apply 1 Application topically 2 (two) times daily. On affected area on leg.  Use for 7-10 days only - then stop. 30 g 0  No current facility-administered medications for this visit.    PHYSICAL EXAMINATION:   BP 115/61   Pulse 86   Temp 97.9 F (36.6 C)   Resp 19   Wt 140 lb 3.2 oz (63.6 kg)   SpO2 96%   BMI 28.32 kg/m   Filed Weights   06/10/23 1514  Weight: 140 lb 3.2 oz (63.6 kg)     Physical Exam Constitutional:      Comments: Frail-appearing Caucasian female patient.  She is in a wheelchair.  HENT:     Head: Normocephalic and atraumatic.     Mouth/Throat:     Pharynx: No oropharyngeal exudate.  Eyes:     Pupils: Pupils are equal, round, and reactive to light.     Comments: Positive for pallor.  Cardiovascular:     Rate and Rhythm: Normal rate and regular rhythm.  Pulmonary:     Effort: No respiratory distress.     Breath sounds: No wheezing.  Abdominal:     General: Bowel sounds are normal. There is no distension.     Palpations: Abdomen is soft. There is no mass.     Tenderness: There is no abdominal tenderness. There is no guarding or rebound.  Musculoskeletal:        General: No  tenderness. Normal range of motion.     Cervical back: Normal range of motion and neck supple.  Skin:    General: Skin is warm.  Neurological:     Mental Status: She is alert and oriented to person, place, and time.  Psychiatric:        Mood and Affect: Affect normal.     LABORATORY DATA:  I have reviewed the data as listed    Component Value Date/Time   NA 138 06/10/2023 1501   NA 143 01/10/2013 0314   K 4.3 06/10/2023 1501   K 4.0 01/10/2013 0314   CL 105 06/10/2023 1501   CL 109 (H) 01/10/2013 0314   CO2 26 06/10/2023 1501   CO2 27 01/10/2013 0314   GLUCOSE 165 (H) 06/10/2023 1501   GLUCOSE 119 (H) 01/10/2013 0314   BUN 26 (H) 06/10/2023 1501   BUN 15 01/10/2013 0314   CREATININE 1.32 (H) 06/10/2023 1501   CREATININE 1.24 (H) 05/13/2023 1416   CREATININE 1.03 01/10/2013 0314   CREATININE 1.13 (H) 09/18/2012 0848   CALCIUM 8.8 (L) 06/10/2023 1501   CALCIUM 8.1 (L) 01/10/2013 0314   PROT 6.7 12/24/2022 1521   PROT 7.2 12/20/2011 1521   ALBUMIN 4.2 12/24/2022 1521   ALBUMIN 3.9 12/20/2011 1521   AST 36 12/24/2022 1521   AST 31 12/20/2011 1521   ALT 28 12/24/2022 1521   ALT 37 12/20/2011 1521   ALKPHOS 57 12/24/2022 1521   ALKPHOS 57 12/20/2011 1521   BILITOT 0.4 12/24/2022 1521   BILITOT 0.5 12/20/2011 1521   GFRNONAA 39 (L) 06/10/2023 1501   GFRNONAA 42 (L) 05/13/2023 1416   GFRNONAA 52 (L) 01/10/2013 0314   GFRNONAA 47 (L) 09/18/2012 0848   GFRAA 46 (L) 07/12/2020 1240   GFRAA >60 01/10/2013 0314   GFRAA 54 (L) 09/18/2012 0848    No results found for: "SPEP", "UPEP"  Lab Results  Component Value Date   WBC 5.8 06/10/2023   NEUTROABS 4.2 06/10/2023   HGB 8.7 (L) 06/10/2023   HCT 28.5 (L) 06/10/2023   MCV 109.6 (H) 06/10/2023   PLT 156 06/10/2023      Chemistry      Component Value Date/Time  NA 138 06/10/2023 1501   NA 143 01/10/2013 0314   K 4.3 06/10/2023 1501   K 4.0 01/10/2013 0314   CL 105 06/10/2023 1501   CL 109 (H) 01/10/2013 0314    CO2 26 06/10/2023 1501   CO2 27 01/10/2013 0314   BUN 26 (H) 06/10/2023 1501   BUN 15 01/10/2013 0314   CREATININE 1.32 (H) 06/10/2023 1501   CREATININE 1.24 (H) 05/13/2023 1416   CREATININE 1.03 01/10/2013 0314   CREATININE 1.13 (H) 09/18/2012 0848      Component Value Date/Time   CALCIUM 8.8 (L) 06/10/2023 1501   CALCIUM 8.1 (L) 01/10/2013 0314   ALKPHOS 57 12/24/2022 1521   ALKPHOS 57 12/20/2011 1521   AST 36 12/24/2022 1521   AST 31 12/20/2011 1521   ALT 28 12/24/2022 1521   ALT 37 12/20/2011 1521   BILITOT 0.4 12/24/2022 1521   BILITOT 0.5 12/20/2011 1521      ASSESSMENT & PLAN:   Anemia of chronic kidney failure, stage 3 (moderate) (HCC) #Severe anemia hemoglobin-nadir 6 [summer 2020]. 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 JUNE 2023- NEGATIVE for hemolysis [haptoglobin-WNL]  # Today hemoglobin is  8.3 ; Proceed with Retacrit today.- MARCH 2024-  I sat-27 ferritin-71[name]- monthly; iron studies/ferritin- pending. Plan Ferrahem in 1 week.   # B12 def- FEB 2023- 232; continue B12 injections every monthly. [march 2023] ; B12 levels - 723 [NOV 2023] Proceed with injection today.  Stable.   # CKD stage III- Recommend continued p.o. intake.  Stable.   # TIA: on plavix [Dr.Shah]- stable.   Monthly B12; Ferra/retacrit q m  # DISPOSITION:  # HOLD Ferrahem today;  # proceed with RETACRIT;  B 12 injection # Ferrahem in 2 week  # in 4 weeks-  MD-  labs- -cbc;bmp; iron studies; ferritin- possible Ferrahem or possible retacrit; B12 injection- Dr.B   Earna Coder, MD 06/10/2023 3:53 PM

## 2023-06-17 ENCOUNTER — Telehealth (INDEPENDENT_AMBULATORY_CARE_PROVIDER_SITE_OTHER): Payer: Medicare Other | Admitting: Internal Medicine

## 2023-06-17 ENCOUNTER — Telehealth: Payer: Self-pay | Admitting: Internal Medicine

## 2023-06-17 ENCOUNTER — Other Ambulatory Visit: Payer: Self-pay | Admitting: Internal Medicine

## 2023-06-17 ENCOUNTER — Encounter: Payer: Self-pay | Admitting: Internal Medicine

## 2023-06-17 VITALS — BP 109/60 | HR 88 | Ht 59.0 in | Wt 140.0 lb

## 2023-06-17 DIAGNOSIS — I700 Atherosclerosis of aorta: Secondary | ICD-10-CM

## 2023-06-17 DIAGNOSIS — E1159 Type 2 diabetes mellitus with other circulatory complications: Secondary | ICD-10-CM | POA: Diagnosis not present

## 2023-06-17 DIAGNOSIS — I4891 Unspecified atrial fibrillation: Secondary | ICD-10-CM

## 2023-06-17 DIAGNOSIS — D649 Anemia, unspecified: Secondary | ICD-10-CM | POA: Diagnosis not present

## 2023-06-17 DIAGNOSIS — I10 Essential (primary) hypertension: Secondary | ICD-10-CM

## 2023-06-17 DIAGNOSIS — U071 COVID-19: Secondary | ICD-10-CM

## 2023-06-17 MED ORDER — NIRMATRELVIR/RITONAVIR (PAXLOVID) TABLET (RENAL DOSING): 2.0000 | ORAL_TABLET | Freq: Two times a day (BID) | ORAL | 0 refills | Status: AC

## 2023-06-17 NOTE — Progress Notes (Unsigned)
Patient ID: Jocelyn Elliott, female   DOB: [birth year], 89 y.o.   MRN: [id number]   Virtual Visit via video Note  I connected with [name] by a video enabled telemedicine application and verified that I am speaking with the correct person using two identifiers. Location patient: home Location provider: work  Persons participating in the virtual visit: patient, provider  The limitations, risks, security and privacy concerns of performing an evaluation and management service by video and the availability of in person appointments have been discussed. It has also been discussed with the patient that there may be a patient responsible charge related to this service. The patient expressed understanding and agreed to proceed.   Reason for visit: work in appt  HPI: Work in - covid positive.  Reports symptoms started 06/15/23.  Reported headache, body aches, runny nose and cough. No chest pain.  No increased sob.  Increased congestion.  Chills.  Diarrhea this am.  No vomiting.  Tested positive 06/16/23.  Using walker to ambulate.  Took mucinex yesterday am.     ROS: See pertinent positives and negatives per HPI.  Past Medical History:  Diagnosis Date   Arthritis    AV malformation of gastrointestinal tract    Blood in stool    CAD (coronary artery disease)    Carotid arterial disease (HCC)    Cataracts, bilateral    Chronic blood loss anemia    Chronic cystitis    CKD (chronic kidney disease), stage III (HCC)    Complication of anesthesia    reaction to propofol - caused heart to pause   Depression    Diabetes mellitus (HCC)    GERD (gastroesophageal reflux disease)    Hyperlipidemia    Hypertension    IDA (iron deficiency anemia)    Stress incontinence    TIA (transient ischemic attack) 03/2021   No deficits   Wears dentures    full upper and lower    Past Surgical History:  Procedure Laterality Date   ABDOMINAL HYSTERECTOMY  11/19/1970   ovaries left in place    APPENDECTOMY  11/19/1990   BACK SURGERY  06/08/2010   L3, L4, L5   CARDIAC CATHETERIZATION  02/2001   CAROTID ARTERY ANGIOPLASTY  09/19/2002   CARPAL TUNNEL RELEASE  1991, 92   right then left    CATARACT EXTRACTION W/PHACO Left 02/13/2022   Procedure: CATARACT EXTRACTION PHACO AND INTRAOCULAR LENS PLACEMENT (IOC) LEFT DIABETIC 14.90 01:26.8;  Surgeon: Galen Manila, MD;  Location: [facility];  Service: Ophthalmology;  Laterality: Left;  Diabetic   CATARACT EXTRACTION W/PHACO Right 03/06/2022   Procedure: CATARACT EXTRACTION PHACO AND INTRAOCULAR LENS PLACEMENT (IOC) RIGHT DIABETIC 9.66 00:53.7;  Surgeon: Galen Manila, MD;  Location: [facility];  Service: Ophthalmology;  Laterality: Right;  Diabetic   CHOLECYSTECTOMY  11/20/1991   COLONOSCOPY WITH PROPOFOL N/A 02/02/2019   Procedure: COLONOSCOPY WITH PROPOFOL;  Surgeon: Scot Jun, MD;  Location: [facility] END[facility];  Service: Endoscopy;  Laterality: N/A;   ESOPHAGOGASTRODUODENOSCOPY N/A 02/02/2019   Procedure: ESOPHAGOGASTRODUODENOSCOPY (EGD);  Surgeon: Scot Jun, MD;  Location: [facility];  Service: Endoscopy;  Laterality: N/A;   FOOT SURGERY  06/20/2007   Left   right carotid artery surgery  01/09/2013   Dr. Gilda Crease @ [facility]   TOTAL HIP ARTHROPLASTY  05/03/2011   left 12, right 11/07    Family History  Problem Relation Age of Onset   Hyperlipidemia Father    Heart disease Father  myocardial infarction   Hypertension Father        Parent   Arthritis Other        parent   Diabetes Other        nephew   Cervical cancer Sister    Rectal cancer Sister    Breast cancer Neg Hx     SOCIAL HX: reviewed.    Current Outpatient Medications:    nirmatrelvir/ritonavir, renal dosing, (PAXLOVID) 10 x 150 MG & 10 x 100MG  TABS, Take 2 tablets by mouth 2 (two) times daily for 5 days. (Take nirmatrelvir 150 mg one tablet twice daily for 5 days and ritonavir 100 mg one tablet twice daily for 5  days) Patient GFR is 39 (last check), Disp: 20 tablet, Rfl: 0   aspirin 81 MG tablet, Take 81 mg by mouth daily., Disp: , Rfl:    Biotin 5000 MCG CAPS, Take 1 capsule by mouth daily., Disp: , Rfl:    cetirizine (ZYRTEC) 10 MG tablet, Take 10 mg by mouth daily., Disp: , Rfl:    docusate sodium (COLACE) 100 MG capsule, Take 100 mg by mouth daily as needed for mild constipation., Disp: , Rfl:    ferrous sulfate 325 (65 FE) MG EC tablet, Take 325 mg by mouth every other day. Every other day, Disp: , Rfl:    guaiFENesin-dextromethorphan (ROBITUSSIN DM) 100-10 MG/5ML syrup, Take 5 mLs by mouth every 4 (four) hours as needed for cough., Disp: 118 mL, Rfl: 0   Lancet Device MISC, Use as directed to check blood sugars, Disp: 1 each, Rfl: 0   Lancets (ONETOUCH DELICA PLUS LANCET30G) MISC, USE AS INSTRUCTED TO CHECK BLOOD SUGARS ONCE DAILY. DX E 11.9, Disp: 100 each, Rfl: 12   Omega-3 Fatty Acids (FISH OIL) 1200 MG CAPS, Take 1 capsule by mouth daily., Disp: , Rfl:    Probiotic, Lactobacillus, CAPS, Take 1 capsule by mouth daily., Disp: , Rfl:    rosuvastatin (CRESTOR) 40 MG tablet, TAKE 1 TABLET BY MOUTH EVERY DAY, Disp: 90 tablet, Rfl: 1   traZODone (DESYREL) 50 MG tablet, TAKE 1/2 TO 1 TABLET BY MOUTH AT BEDTIME AS NEEDED FOR SLEEP, Disp: 90 tablet, Rfl: 1   triamcinolone cream (KENALOG) 0.1 %, Apply 1 Application topically 2 (two) times daily. On affected area on leg.  Use for 7-10 days only - then stop., Disp: 30 g, Rfl: 0  EXAM:  VITALS per patient if applicable: 109/60, 88  GENERAL: alert, oriented, appears well and in no acute distress  HEENT: atraumatic, conjunttiva clear, no obvious abnormalities on inspection of external nose and ears  NECK: normal movements of the head and neck  LUNGS: on inspection no signs of respiratory distress, breathing rate appears normal, no obvious gross SOB, gasping or wheezing  CV: no obvious cyanosis  PSYCH/NEURO: pleasant and cooperative, no obvious  depression or anxiety, speech and thought processing grossly intact  ASSESSMENT AND PLAN:  Discussed the following assessment and plan:  Problem List Items Addressed This Visit     Hypertension    Continues on imdur.  Follow pressures.        Diabetes mellitus (HCC)    Sugars have been under reasonable control.  Follow.       COVID-19 virus infection - Primary    Symptoms started 06/15/23.  Tested positive 06/16/23.  Increased congestion and cough.  Discussed covid.  Discussed oral anti viral medication and possible side effects. Given her underlying lung disease, discussed paxlovid.  Agreeable to start.  Saline nasal spray and nasacort nasal spray as outlined.  Mucinex.  Prednisone taper as directed.  Rest. Stay hydrated.  Follow.  Call with update.  Hold crestor while on paxlovid.       Relevant Medications   nirmatrelvir/ritonavir, renal dosing, (PAXLOVID) 10 x 150 MG & 10 x 100MG  TABS   Aortic atherosclerosis (HCC)    Continue crestor.         Anemia    Followed by hematology.  History of AVM.  Also with CKD.  Receiving IV iron.        Afib (HCC)    Continues on aspirin. Heart rate controlled.        No follow-ups on file.   I discussed the assessment and treatment plan with the patient. The patient was provided an opportunity to ask questions and all were answered. The patient agreed with the plan and demonstrated an understanding of the instructions.   The patient was advised to call back or seek an in-person evaluation if the symptoms worsen or if the condition fails to improve as anticipated.    Dale Silverton, MD

## 2023-06-17 NOTE — Telephone Encounter (Signed)
Called patient for more info. Patient started having symptoms Saturday and tested positive on Sunday. Daughter and grandson positive as well. Patient is having headache, body aches, congestion (head and chest), runny nose and cough. Patient denies sob or fever. Declined coming in for in person eval but did agree to do a virtual. Patient scheduled.

## 2023-06-17 NOTE — Telephone Encounter (Signed)
Pt daughter called in stating that pt tested positive this morning for Covid and she wants to know if Dr. Lorin Picket can call her some meds for it? She stated they both are sick and can't come out to drive because of the headaches.

## 2023-06-19 ENCOUNTER — Other Ambulatory Visit: Payer: Self-pay

## 2023-06-19 ENCOUNTER — Encounter: Payer: Self-pay | Admitting: Internal Medicine

## 2023-06-19 ENCOUNTER — Encounter (INDEPENDENT_AMBULATORY_CARE_PROVIDER_SITE_OTHER): Payer: Self-pay

## 2023-06-19 MED ORDER — LANCET DEVICE MISC: 0 refills | Status: DC

## 2023-06-19 NOTE — Assessment & Plan Note (Signed)
Followed by hematology.  History of AVM.  Also with CKD.  Receiving IV iron.

## 2023-06-19 NOTE — Telephone Encounter (Signed)
Thank her for the update.  I am glad she is feeling better.  Keep [place] posted and let us know if any problems.

## 2023-06-19 NOTE — Assessment & Plan Note (Signed)
Continues on imdur.  Follow pressures.   ?

## 2023-06-19 NOTE — Telephone Encounter (Signed)
Pt daughter called back to let Dr. Lorin Picket knows that Paxlovid has helped pt a lot, she's feeling wonderful. Still a little bit under the weather but much better.   She also stated if Dr. Lorin Picket can sent over her pen to [facility] on [address]? Pt one its broken

## 2023-06-19 NOTE — Telephone Encounter (Signed)
FYI for you. I have sent in lancet device for her

## 2023-06-19 NOTE — Assessment & Plan Note (Signed)
Sugars have been under reasonable control.  Follow.   

## 2023-06-19 NOTE — Telephone Encounter (Signed)
Robin is aware

## 2023-06-19 NOTE — Assessment & Plan Note (Signed)
Continue crestor 

## 2023-06-19 NOTE — Assessment & Plan Note (Addendum)
Symptoms started 06/15/23.  Tested positive 06/16/23.  Increased congestion and cough.  Discussed covid.  Discussed oral anti viral medication and possible side effects. Given her underlying lung disease, discussed paxlovid.  Agreeable to start.  Saline nasal spray and nasacort nasal spray as outlined.  Mucinex.  Prednisone taper as directed.  Rest. Stay hydrated.  Follow.  Call with update.  Hold crestor while on paxlovid.

## 2023-06-19 NOTE — Assessment & Plan Note (Signed)
Continues on aspirin. Heart rate controlled.

## 2023-06-24 ENCOUNTER — Inpatient Hospital Stay: Payer: Medicare Other

## 2023-07-01 ENCOUNTER — Inpatient Hospital Stay: Payer: Medicare Other | Attending: Internal Medicine

## 2023-07-01 VITALS — BP 103/48 | HR 74 | Temp 97.8°F

## 2023-07-01 DIAGNOSIS — N183 Chronic kidney disease, stage 3 unspecified: Secondary | ICD-10-CM | POA: Diagnosis present

## 2023-07-01 DIAGNOSIS — Z7902 Long term (current) use of antithrombotics/antiplatelets: Secondary | ICD-10-CM | POA: Insufficient documentation

## 2023-07-01 DIAGNOSIS — E1122 Type 2 diabetes mellitus with diabetic chronic kidney disease: Secondary | ICD-10-CM | POA: Insufficient documentation

## 2023-07-01 DIAGNOSIS — Z79899 Other long term (current) drug therapy: Secondary | ICD-10-CM | POA: Diagnosis not present

## 2023-07-01 DIAGNOSIS — E538 Deficiency of other specified B group vitamins: Secondary | ICD-10-CM | POA: Diagnosis not present

## 2023-07-01 DIAGNOSIS — D631 Anemia in chronic kidney disease: Secondary | ICD-10-CM | POA: Insufficient documentation

## 2023-07-01 DIAGNOSIS — Z8673 Personal history of transient ischemic attack (TIA), and cerebral infarction without residual deficits: Secondary | ICD-10-CM | POA: Insufficient documentation

## 2023-07-01 DIAGNOSIS — F32A Depression, unspecified: Secondary | ICD-10-CM | POA: Diagnosis not present

## 2023-07-01 DIAGNOSIS — I129 Hypertensive chronic kidney disease with stage 1 through stage 4 chronic kidney disease, or unspecified chronic kidney disease: Secondary | ICD-10-CM | POA: Diagnosis not present

## 2023-07-01 DIAGNOSIS — D508 Other iron deficiency anemias: Secondary | ICD-10-CM

## 2023-07-01 MED ORDER — SODIUM CHLORIDE 0.9 % IV SOLN
510.0000 mg | Freq: Once | INTRAVENOUS | Status: AC
  Administered 2023-07-01: 510 mg via INTRAVENOUS
  Filled 2023-07-01: qty 510

## 2023-07-01 MED ORDER — SODIUM CHLORIDE 0.9 % IV SOLN
Freq: Once | INTRAVENOUS | Status: AC
  Filled 2023-07-01: qty 250

## 2023-07-01 NOTE — Patient Instructions (Signed)
 Iron Sucrose Injection What is this medication? IRON SUCROSE (EYE ern SOO krose) treats low levels of iron (iron deficiency anemia) in people with kidney disease. Iron is a mineral that plays an important role in making red blood cells, which carry oxygen from your lungs to the rest of your body. This medicine may be used for other purposes; ask your health care provider or pharmacist if you have questions. COMMON BRAND NAME(S): Venofer What should I tell my care team before I take this medication? They need to know if you have any of these conditions: Anemia not caused by low iron levels Heart disease High levels of iron in the blood Kidney disease Liver disease An unusual or allergic reaction to iron, other medications, foods, dyes, or preservatives Pregnant or trying to get pregnant Breastfeeding How should I use this medication? This medication is for infusion into a vein. It is given in a hospital or clinic setting. Talk to your care team about the use of this medication in children. While this medication may be prescribed for children as young as 2 years for selected conditions, precautions do apply. Overdosage: If you think you have taken too much of this medicine contact a poison control center or emergency room at once. NOTE: This medicine is only for you. Do not share this medicine with others. What if I miss a dose? Keep appointments for follow-up doses. It is important not to miss your dose. Call your care team if you are unable to keep an appointment. What may interact with this medication? Do not take this medication with any of the following: Deferoxamine Dimercaprol Other iron products This medication may also interact with the following: Chloramphenicol Deferasirox This list may not describe all possible interactions. Give your health care provider a list of all the medicines, herbs, non-prescription drugs, or dietary supplements you use. Also tell them if you smoke,  drink alcohol, or use illegal drugs. Some items may interact with your medicine. What should I watch for while using this medication? Visit your care team regularly. Tell your care team if your symptoms do not start to get better or if they get worse. You may need blood work done while you are taking this medication. You may need to follow a special diet. Talk to your care team. Foods that contain iron include: whole grains/cereals, dried fruits, beans, or peas, leafy green vegetables, and organ meats (liver, kidney). What side effects may I notice from receiving this medication? Side effects that you should report to your care team as soon as possible: Allergic reactions--skin rash, itching, hives, swelling of the face, lips, tongue, or throat Low blood pressure--dizziness, feeling faint or lightheaded, blurry vision Shortness of breath Side effects that usually do not require medical attention (report to your care team if they continue or are bothersome): Flushing Headache Joint pain Muscle pain Nausea Pain, redness, or irritation at injection site This list may not describe all possible side effects. Call your doctor for medical advice about side effects. You may report side effects to FDA at 1-800-FDA-1088. Where should I keep my medication? This medication is given in a hospital or clinic. It will not be stored at home. NOTE: This sheet is a summary. It may not cover all possible information. If you have questions about this medicine, talk to your doctor, pharmacist, or health care provider.  2024 Elsevier/Gold Standard (2023-04-12 00:00:00)

## 2023-07-02 ENCOUNTER — Encounter: Payer: Self-pay | Admitting: Internal Medicine

## 2023-07-02 ENCOUNTER — Ambulatory Visit: Payer: Medicare Other | Admitting: Internal Medicine

## 2023-07-02 VITALS — BP 128/72 | HR 82 | Temp 97.8°F | Resp 16 | Ht 59.0 in | Wt 135.0 lb

## 2023-07-02 DIAGNOSIS — I700 Atherosclerosis of aorta: Secondary | ICD-10-CM | POA: Diagnosis not present

## 2023-07-02 DIAGNOSIS — D696 Thrombocytopenia, unspecified: Secondary | ICD-10-CM

## 2023-07-02 DIAGNOSIS — E1159 Type 2 diabetes mellitus with other circulatory complications: Secondary | ICD-10-CM | POA: Diagnosis not present

## 2023-07-02 DIAGNOSIS — I4891 Unspecified atrial fibrillation: Secondary | ICD-10-CM | POA: Diagnosis not present

## 2023-07-02 DIAGNOSIS — N183 Chronic kidney disease, stage 3 unspecified: Secondary | ICD-10-CM | POA: Diagnosis not present

## 2023-07-02 DIAGNOSIS — E7800 Pure hypercholesterolemia, unspecified: Secondary | ICD-10-CM

## 2023-07-02 DIAGNOSIS — U071 COVID-19: Secondary | ICD-10-CM

## 2023-07-02 DIAGNOSIS — N1832 Chronic kidney disease, stage 3b: Secondary | ICD-10-CM

## 2023-07-02 DIAGNOSIS — I10 Essential (primary) hypertension: Secondary | ICD-10-CM

## 2023-07-02 DIAGNOSIS — I779 Disorder of arteries and arterioles, unspecified: Secondary | ICD-10-CM

## 2023-07-02 DIAGNOSIS — D631 Anemia in chronic kidney disease: Secondary | ICD-10-CM

## 2023-07-02 NOTE — Progress Notes (Signed)
Subjective:    Patient ID: Jocelyn Elliott, female    DOB: Apr 01, 1932, 89 y.o.   MRN: [id number]  Patient here for  Chief Complaint  Patient presents with   Medical Management of Chronic Issues    HPI Here for follow up appt - f/u regarding diabetes, anemia, hypertension and hypercholesterolemia.  She is accompanied by her daughter.  History obtained from both of them. Diagnosed with covid 06/17/23.  Treated with paxlovid. Is doing better.  Feels better.  No increased cough or congestion.  No sob.  Some fatigue.  No chest pain.  Eating.  No vomiting.  Blood sugars averaging 115-130s.  Taking stool softener.  No problems with constipation.  Does report having to go to bathroom q 2 hours - notices more at night.  Discussed if bladder emptying fully.  Discussed possible sleep apnea.  Discussed further w/up.  Declines.     Past Medical History:  Diagnosis Date   Arthritis    AV malformation of gastrointestinal tract    Blood in stool    CAD (coronary artery disease)    Carotid arterial disease (HCC)    Cataracts, bilateral    Chronic blood loss anemia    Chronic cystitis    CKD (chronic kidney disease), stage III (HCC)    Complication of anesthesia    reaction to propofol - caused heart to pause   Depression    Diabetes mellitus (HCC)    GERD (gastroesophageal reflux disease)    Hyperlipidemia    Hypertension    IDA (iron deficiency anemia)    Stress incontinence    TIA (transient ischemic attack) 03/2021   No deficits   Wears dentures    full upper and lower   Past Surgical History:  Procedure Laterality Date   ABDOMINAL HYSTERECTOMY  11/19/1970   ovaries left in place   APPENDECTOMY  11/19/1990   BACK SURGERY  06/08/2010   L3, L4, L5   CARDIAC CATHETERIZATION  02/2001   CAROTID ARTERY ANGIOPLASTY  09/19/2002   CARPAL TUNNEL RELEASE  1991, 92   right then left    CATARACT EXTRACTION W/PHACO Left 02/13/2022   Procedure: CATARACT EXTRACTION PHACO AND INTRAOCULAR LENS  PLACEMENT (IOC) LEFT DIABETIC 14.90 01:26.8;  Surgeon: Galen Manila, MD;  Location: [facility];  Service: Ophthalmology;  Laterality: Left;  Diabetic   CATARACT EXTRACTION W/PHACO Right 03/06/2022   Procedure: CATARACT EXTRACTION PHACO AND INTRAOCULAR LENS PLACEMENT (IOC) RIGHT DIABETIC 9.66 00:53.7;  Surgeon: Galen Manila, MD;  Location: [facility];  Service: Ophthalmology;  Laterality: Right;  Diabetic   CHOLECYSTECTOMY  11/20/1991   COLONOSCOPY WITH PROPOFOL N/A 02/02/2019   Procedure: COLONOSCOPY WITH PROPOFOL;  Surgeon: Scot Jun, MD;  Location: [facility];  Service: Endoscopy;  Laterality: N/A;   ESOPHAGOGASTRODUODENOSCOPY N/A 02/02/2019   Procedure: ESOPHAGOGASTRODUODENOSCOPY (EGD);  Surgeon: Scot Jun, MD;  Location: [facility];  Service: Endoscopy;  Laterality: N/A;   FOOT SURGERY  06/20/2007   Left   right carotid artery surgery  01/09/2013   Dr. Gilda Crease @ [facility]   TOTAL HIP ARTHROPLASTY  05/03/2011   left 12, right 11/07   Family History  Problem Relation Age of Onset   Hyperlipidemia Father    Heart disease Father        myocardial infarction   Hypertension Father        Parent   Arthritis Other        parent   Diabetes Other  nephew   Cervical cancer Sister    Rectal cancer Sister    Breast cancer Neg Hx    Social History   Socioeconomic History   Marital status: Widowed    Spouse name: Not on file   Number of children: 1   Years of education: 63   Highest education level: Not on file  Occupational History   Occupation: Retired  Tobacco Use   Smoking status: Former    Current packs/day: 0.00    Types: Cigarettes    Quit date: 11/19/1989    Years since quitting: 33.6   Smokeless tobacco: Never  Vaping Use   Vaping status: Never Used  Substance and Sexual Activity   Alcohol use: No    Alcohol/week: 0.0 standard drinks of alcohol   Drug use: No   Sexual activity: Not Currently  Other Topics Concern    Not on file  Social History Narrative   Regular exercise-no   Caffeine Use-yes   Social Determinants of Health   Financial Resource Strain: Low Risk  (04/02/2023)   Overall Financial Resource Strain (CARDIA)    Difficulty of Paying Living Expenses: Not hard at all  Food Insecurity: No Food Insecurity (04/02/2023)   Hunger Vital Sign    Worried About Running Out of Food in the Last Year: Never true    Ran Out of Food in the Last Year: Never true  Transportation Needs: No Transportation Needs (04/02/2023)   PRAPARE - Administrator, Civil Service (Medical): No    Lack of Transportation (Non-Medical): No  Physical Activity: Insufficiently Active (04/02/2023)   Exercise Vital Sign    Days of Exercise per Week: 7 days    Minutes of Exercise per Session: 20 min  Stress: No Stress Concern Present (04/02/2023)   [employer] of Occupational Health - Occupational Stress Questionnaire    Feeling of Stress : Not at all  Social Connections: Socially Integrated (04/02/2023)   Social Connection and Isolation Panel [NHANES]    Frequency of Communication with Friends and Family: More than three times a week    Frequency of Social Gatherings with Friends and Family: More than three times a week    Attends Religious Services: More than 4 times per year    Active Member of [employer] or Organizations: Yes    Attends Engineer, structural: More than 4 times per year    Marital Status: Married     Review of Systems  Constitutional:  Positive for fatigue. Negative for appetite change and unexpected weight change.  HENT:  Negative for congestion and sinus pressure.   Respiratory:  Negative for cough and chest tightness.        Breathing stable.   Cardiovascular:  Negative for chest pain and palpitations.       No increased swelling.   Gastrointestinal:  Negative for abdominal pain, diarrhea, nausea and vomiting.  Genitourinary:  Negative for difficulty urinating and dysuria.   Musculoskeletal:  Negative for joint swelling and myalgias.  Skin:  Negative for color change and rash.  Neurological:  Negative for dizziness and headaches.  Psychiatric/Behavioral:  Negative for agitation and dysphoric mood.        Objective:     BP 128/72   Pulse 82   Temp 97.8 F (36.6 C)   Resp 16   Ht 4\' 11"  (1.499 m)   Wt 135 lb (61.2 kg)   SpO2 97%   BMI 27.27 kg/m  Wt Readings from Last 3 Encounters:  07/02/23 135 lb (61.2 kg)  06/17/23 140 lb (63.5 kg)  06/10/23 140 lb 3.2 oz (63.6 kg)    Physical Exam Vitals reviewed.  Constitutional:      General: She is not in acute distress.    Appearance: Normal appearance.  HENT:     Head: Normocephalic and atraumatic.     Right Ear: External ear normal.     Left Ear: External ear normal.  Eyes:     General: No scleral icterus.       Right eye: No discharge.        Left eye: No discharge.     Conjunctiva/sclera: Conjunctivae normal.  Neck:     Thyroid: No thyromegaly.  Cardiovascular:     Rate and Rhythm: Normal rate and regular rhythm.  Pulmonary:     Effort: No respiratory distress.     Breath sounds: Normal breath sounds. No wheezing.  Abdominal:     General: Bowel sounds are normal.     Palpations: Abdomen is soft.     Tenderness: There is no abdominal tenderness.  Musculoskeletal:        General: No swelling or tenderness.     Cervical back: Neck supple. No tenderness.  Lymphadenopathy:     Cervical: No cervical adenopathy.  Skin:    Findings: No erythema or rash.  Neurological:     Mental Status: She is alert.  Psychiatric:        Mood and Affect: Mood normal.        Behavior: Behavior normal.      Outpatient Encounter Medications as of 07/02/2023  Medication Sig   aspirin 81 MG tablet Take 81 mg by mouth daily.   Biotin 5000 MCG CAPS Take 1 capsule by mouth daily.   cetirizine (ZYRTEC) 10 MG tablet Take 10 mg by mouth daily.   docusate sodium (COLACE) 100 MG capsule Take 100 mg by mouth  daily as needed for mild constipation.   ferrous sulfate 325 (65 FE) MG EC tablet Take 325 mg by mouth every other day. Every other day   guaiFENesin-dextromethorphan (ROBITUSSIN DM) 100-10 MG/5ML syrup Take 5 mLs by mouth every 4 (four) hours as needed for cough.   Lancet Device MISC Use as directed to check blood sugars   Lancets (ONETOUCH DELICA PLUS LANCET30G) MISC USE AS INSTRUCTED TO CHECK BLOOD SUGARS ONCE DAILY. DX E 11.9   Omega-3 Fatty Acids (FISH OIL) 1200 MG CAPS Take 1 capsule by mouth daily.   Probiotic, Lactobacillus, CAPS Take 1 capsule by mouth daily.   rosuvastatin (CRESTOR) 40 MG tablet TAKE 1 TABLET BY MOUTH EVERY DAY   traZODone (DESYREL) 50 MG tablet TAKE 1/2 TO 1 TABLET BY MOUTH AT BEDTIME AS NEEDED FOR SLEEP   triamcinolone cream (KENALOG) 0.1 % Apply 1 Application topically 2 (two) times daily. On affected area on leg.  Use for 7-10 days only - then stop.   No facility-administered encounter medications on file as of 07/02/2023.     Lab Results  Component Value Date   WBC 5.8 06/10/2023   HGB 8.7 (L) 06/10/2023   HCT 28.5 (L) 06/10/2023   PLT 156 06/10/2023   GLUCOSE 165 (H) 06/10/2023   CHOL 145 12/24/2022   TRIG 118.0 12/24/2022   HDL 69.60 12/24/2022   LDLDIRECT 51.0 04/14/2019   LDLCALC 52 12/24/2022   ALT 28 12/24/2022   AST 36 12/24/2022   NA 138 06/10/2023   K 4.3 06/10/2023   CL 105 06/10/2023  CREATININE 1.32 (H) 06/10/2023   BUN 26 (H) 06/10/2023   CO2 26 06/10/2023   TSH 1.82 07/16/2022   INR 1.2 02/28/2023   HGBA1C 5.1 12/24/2022   MICROALBUR 12.7 (H) 07/16/2022    DG Chest 2 View  Result Date: 02/28/2023 CLINICAL DATA:  Shortness of breath EXAM: CHEST - 2 VIEW COMPARISON:  Chest radiograph dated 02/04/2019 FINDINGS: Normal lung volumes. Right basilar patchy opacities. No pleural effusion or pneumothorax. The heart size and mediastinal contours are within normal limits. Partially imaged lumbar spinal fixation hardware appears intact.  IMPRESSION: Right basilar patchy opacities, suspicious for pneumonia. Electronically Signed   By: Agustin Cree M.D.   On: 02/28/2023 18:53       Assessment & Plan:  Type 2 diabetes mellitus with other circulatory complication, without long-term current use of insulin (HCC) Assessment & Plan: Sugars as outlined.  Watches diet.   Lab Results  Component Value Date   HGBA1C 5.1 12/24/2022      Atrial fibrillation, unspecified type [facility]) Assessment & Plan: Continues on aspirin. Heart rate controlled. Appears to have regular rhythm on exam.    Anemia of chronic kidney failure, stage 3 (moderate) (HCC) Assessment & Plan: Being followed by Dr Donneta Romberg.  Last hgb 8.7.  IV iron.  Continue f/u with hematology.    Aortic atherosclerosis (HCC) Assessment & Plan: Continue crestor.      Carotid artery disease, unspecified laterality, unspecified type (HCC) Assessment & Plan:  Continue antiplatelet therapy as prescribed Continue management of CAD, HTN and Hyperlipidemia Evaluated [facility] 08/20/22 -ultrasound shows RICA 1-39% and LICA 40-59% stenosis bilaterally s/p left CEA.  Recommended f/u in 12 months.     Stage 3b chronic kidney disease (HCC) Assessment & Plan: Continue to avoid antiinflammatories.  Stay hydrated.  Follow metabolic panel. Off ace inhibitor due to low blood pressure. Will have metabolic panel checked through hematology.    Thrombocytopenia (HCC) Assessment & Plan: Follow cbc.  Being followed by hematology.    Primary hypertension Assessment & Plan: Continues on imdur.  Follow pressures.  Denies dizziness or light headedness.    Hypercholesteremia Assessment & Plan: Continue crestor.  Low cholesterol diet and exercise.  Follow lipid panel and liver function tests.     COVID-19 virus infection Assessment & Plan: Recent infection.  Treated with paxlovid.  Doing well.  No increased sob.  Follow.        Dale [place], MD

## 2023-07-05 ENCOUNTER — Other Ambulatory Visit: Payer: Self-pay | Admitting: *Deleted

## 2023-07-05 DIAGNOSIS — E1169 Type 2 diabetes mellitus with other specified complication: Secondary | ICD-10-CM

## 2023-07-07 ENCOUNTER — Encounter: Payer: Self-pay | Admitting: Internal Medicine

## 2023-07-07 NOTE — Assessment & Plan Note (Signed)
Continue to avoid antiinflammatories.  Stay hydrated.  Follow metabolic panel. Off ace inhibitor due to low blood pressure. Will have metabolic panel checked through hematology.

## 2023-07-07 NOTE — Assessment & Plan Note (Addendum)
Continues on aspirin. Heart rate controlled. Appears to have regular rhythm on exam.

## 2023-07-07 NOTE — Assessment & Plan Note (Addendum)
Being followed by Dr Donneta Romberg.  Last hgb 8.7.  IV iron.  Continue f/u with hematology.

## 2023-07-07 NOTE — Assessment & Plan Note (Signed)
Sugars as outlined.  Watches diet.   Lab Results  Component Value Date   HGBA1C 5.1 12/24/2022

## 2023-07-07 NOTE — Assessment & Plan Note (Addendum)
Continues on imdur.  Follow pressures.  Denies dizziness or light headedness.

## 2023-07-07 NOTE — Assessment & Plan Note (Signed)
Recent infection.  Treated with paxlovid.  Doing well.  No increased sob.  Follow.

## 2023-07-07 NOTE — Assessment & Plan Note (Signed)
Continue crestor 

## 2023-07-07 NOTE — Assessment & Plan Note (Signed)
Continue antiplatelet therapy as prescribed Continue management of CAD, HTN and Hyperlipidemia Evaluated [facility] 08/20/22 -ultrasound shows RICA 1-39% and LICA 40-59% stenosis bilaterally s/p left CEA.  Recommended f/u in 12 months.

## 2023-07-07 NOTE — Assessment & Plan Note (Signed)
Follow cbc.  Being followed by hematology.

## 2023-07-07 NOTE — Assessment & Plan Note (Signed)
Continue crestor.  Low cholesterol diet and exercise.  Follow lipid panel and liver function tests.  

## 2023-07-08 ENCOUNTER — Inpatient Hospital Stay: Payer: Medicare Other

## 2023-07-08 ENCOUNTER — Other Ambulatory Visit: Payer: Self-pay | Admitting: *Deleted

## 2023-07-08 ENCOUNTER — Inpatient Hospital Stay: Payer: Medicare Other | Admitting: Internal Medicine

## 2023-07-08 ENCOUNTER — Encounter: Payer: Self-pay | Admitting: Internal Medicine

## 2023-07-08 VITALS — BP 105/61 | HR 72 | Temp 97.4°F | Ht 59.0 in | Wt 136.7 lb

## 2023-07-08 DIAGNOSIS — D649 Anemia, unspecified: Secondary | ICD-10-CM

## 2023-07-08 DIAGNOSIS — D631 Anemia in chronic kidney disease: Secondary | ICD-10-CM

## 2023-07-08 DIAGNOSIS — E1169 Type 2 diabetes mellitus with other specified complication: Secondary | ICD-10-CM

## 2023-07-08 DIAGNOSIS — N183 Chronic kidney disease, stage 3 unspecified: Secondary | ICD-10-CM

## 2023-07-08 DIAGNOSIS — I129 Hypertensive chronic kidney disease with stage 1 through stage 4 chronic kidney disease, or unspecified chronic kidney disease: Secondary | ICD-10-CM | POA: Diagnosis not present

## 2023-07-08 LAB — CMP (CANCER CENTER ONLY)
ALT: 15 U/L (ref 0–44)
AST: 19 U/L (ref 15–41)
Albumin: 3.4 g/dL — ABNORMAL LOW (ref 3.5–5.0)
Alkaline Phosphatase: 43 U/L (ref 38–126)
Anion gap: 7 (ref 5–15)
BUN: 20 mg/dL (ref 8–23)
CO2: 26 mmol/L (ref 22–32)
Calcium: 9 mg/dL (ref 8.9–10.3)
Chloride: 102 mmol/L (ref 98–111)
Creatinine: 1.11 mg/dL — ABNORMAL HIGH (ref 0.44–1.00)
GFR, Estimated: 48 mL/min — ABNORMAL LOW (ref >60)
Glucose, Bld: 101 mg/dL — ABNORMAL HIGH (ref 70–99)
Potassium: 4.3 mmol/L (ref 3.5–5.1)
Sodium: 135 mmol/L (ref 135–145)
Total Bilirubin: 0.5 mg/dL (ref 0.3–1.2)
Total Protein: 6.3 g/dL — ABNORMAL LOW (ref 6.5–8.1)

## 2023-07-08 LAB — CBC WITH DIFFERENTIAL (CANCER CENTER ONLY)
Abs Immature Granulocytes: 0.02 10*3/uL (ref 0.00–0.07)
Basophils Absolute: 0 10*3/uL (ref 0.0–0.1)
Basophils Relative: 1 %
Eosinophils Absolute: 0.2 10*3/uL (ref 0.0–0.5)
Eosinophils Relative: 3 %
HCT: 30.9 % — ABNORMAL LOW (ref 36.0–46.0)
Hemoglobin: 9.5 g/dL — ABNORMAL LOW (ref 12.0–15.0)
Immature Granulocytes: 0 %
Lymphocytes Relative: 20 %
Lymphs Abs: 1.1 10*3/uL (ref 0.7–4.0)
MCH: 32.9 pg (ref 26.0–34.0)
MCHC: 30.7 g/dL (ref 30.0–36.0)
MCV: 106.9 fL — ABNORMAL HIGH (ref 80.0–100.0)
Monocytes Absolute: 0.4 10*3/uL (ref 0.1–1.0)
Monocytes Relative: 7 %
Neutro Abs: 3.7 10*3/uL (ref 1.7–7.7)
Neutrophils Relative %: 69 %
Platelet Count: 194 10*3/uL (ref 150–400)
RBC: 2.89 MIL/uL — ABNORMAL LOW (ref 3.87–5.11)
RDW: 16.2 % — ABNORMAL HIGH (ref 11.5–15.5)
WBC Count: 5.3 10*3/uL (ref 4.0–10.5)
nRBC: 0 % (ref 0.0–0.2)

## 2023-07-08 LAB — FERRITIN: Ferritin: 377 ng/mL — ABNORMAL HIGH (ref 11–307)

## 2023-07-08 LAB — IRON AND TIBC
Iron: 91 ug/dL (ref 28–170)
Saturation Ratios: 27 % (ref 10.4–31.8)
TIBC: 332 ug/dL (ref 250–450)
UIBC: 241 ug/dL

## 2023-07-08 LAB — LIPID PANEL
Cholesterol: 112 mg/dL (ref 0–200)
HDL: 52 mg/dL (ref >40)
LDL Cholesterol: 30 mg/dL (ref 0–99)
Total CHOL/HDL Ratio: 2.2 RATIO
Triglycerides: 150 mg/dL — ABNORMAL HIGH (ref <150)
VLDL: 30 mg/dL (ref 0–40)

## 2023-07-08 LAB — TSH: TSH: 1.401 u[IU]/mL (ref 0.350–4.500)

## 2023-07-08 LAB — HEMOGLOBIN A1C
Hgb A1c MFr Bld: 4.9 % (ref 4.8–5.6)
Mean Plasma Glucose: 93.93 mg/dL

## 2023-07-08 MED ORDER — EPOETIN ALFA-EPBX 40000 UNIT/ML IJ SOLN
40000.0000 [IU] | Freq: Once | INTRAMUSCULAR | Status: AC
  Administered 2023-07-08: 40000 [IU] via SUBCUTANEOUS

## 2023-07-08 MED ORDER — CYANOCOBALAMIN 1000 MCG/ML IJ SOLN
1000.0000 ug | Freq: Once | INTRAMUSCULAR | Status: AC
  Administered 2023-07-08: 1000 ug via INTRAMUSCULAR

## 2023-07-08 NOTE — Addendum Note (Signed)
Addended by: Clydia Llano on: 07/08/2023 02:07 PM   Modules accepted: Orders

## 2023-07-08 NOTE — Progress Notes (Signed)
[facility] OFFICE PROGRESS NOTE  Patient Care Team: Dale Gumbranch, MD as PCP - General (Internal Medicine) Dale Berlin, MD (Internal Medicine) Marcina Millard, MD (Internal Medicine) Willeen Niece, MD (Specialist) Schnier, Latina Craver, MD (Vascular Surgery) Marcina Millard, MD (Internal Medicine) Willeen Niece, MD (Specialist) Schnier, Latina Craver, MD (Vascular Surgery) Earna Coder, MD as Consulting Physician (Oncology)   SUMMARY OF HEMATOLOGIC HISTORY:  # IRON DEFICIENCY ANEMIA- recurrent [? GI blood loss; Dr.Elliot; AVM-capsule]; IV Ferrahem q 11m; last colo- 2012/march; EGD- [id number].  July 2020 bone marrow biopsy-mild dysplastic changes; 40% hypercellularity; cytogenetics-normal.-Retacrit.MARCH 2023- Foundation One Hem- TP53 subclonal. B12 def- FEB 2023- Start B12 injections Monthly; MAY 3rd, 2023- Start Retacrit 20K [place].   #Colonoscopy-April 2020 aborted because of cardiac pause.  # CKD stage III-IV; CAD-Dr. Darrold Junker;  March 30th, 2022-TIA/slurred spleech [Dr.Shah]  INTERVAL HISTORY: In a wheelchair.  With her daughter.   89 year old female patient with above history of recurrent iron deficiency anemia unclear etiology/?-related to CKD-on Feraheme/Retacrit is here for follow-up.  Patient had COVID in interim. Felt "congestion" in chest- coughing. S/p paxlovid. Currently resolved.    Denies dizziness or fatigue. No visible bleeding. Patient denies any nausea vomiting.   Review of Systems  Constitutional:  Positive for malaise/fatigue. Negative for chills, diaphoresis, fever and weight loss.  HENT:  Negative for nosebleeds and sore throat.   Eyes:  Negative for double vision.  Respiratory:  Negative for cough, hemoptysis, sputum production and wheezing.   Cardiovascular:  Negative for palpitations, orthopnea and leg swelling.  Gastrointestinal:  Negative for abdominal pain, blood in stool, constipation, diarrhea, heartburn, melena,  nausea and vomiting.  Genitourinary:  Negative for dysuria, frequency and urgency.  Musculoskeletal:  Negative for back pain and joint pain.  Skin: Negative.  Negative for itching and rash.  Neurological:  Negative for dizziness, tingling, focal weakness, weakness and headaches.  Endo/Heme/Allergies:  Does not bruise/bleed easily.  Psychiatric/Behavioral:  Negative for depression. The patient is not nervous/anxious and does not have insomnia.      PAST MEDICAL HISTORY :  Past Medical History:  Diagnosis Date   Arthritis    AV malformation of gastrointestinal tract    Blood in stool    CAD (coronary artery disease)    Carotid arterial disease (HCC)    Cataracts, bilateral    Chronic blood loss anemia    Chronic cystitis    CKD (chronic kidney disease), stage III (HCC)    Complication of anesthesia    reaction to propofol - caused heart to pause   Depression    Diabetes mellitus (HCC)    GERD (gastroesophageal reflux disease)    Hyperlipidemia    Hypertension    IDA (iron deficiency anemia)    Stress incontinence    TIA (transient ischemic attack) 03/2021   No deficits   Wears dentures    full upper and lower    PAST SURGICAL HISTORY :   Past Surgical History:  Procedure Laterality Date   ABDOMINAL HYSTERECTOMY  11/19/1970   ovaries left in place   APPENDECTOMY  11/19/1990   BACK SURGERY  06/08/2010   L3, L4, L5   CARDIAC CATHETERIZATION  02/2001   CAROTID ARTERY ANGIOPLASTY  09/19/2002   CARPAL TUNNEL RELEASE  1991, 92   right then left    CATARACT EXTRACTION W/PHACO Left 02/13/2022   Procedure: CATARACT EXTRACTION PHACO AND INTRAOCULAR LENS PLACEMENT (IOC) LEFT DIABETIC 14.90 01:26.8;  Surgeon: Galen Manila, MD;  Location: [facility];  Service:  Ophthalmology;  Laterality: Left;  Diabetic   CATARACT EXTRACTION W/PHACO Right 03/06/2022   Procedure: CATARACT EXTRACTION PHACO AND INTRAOCULAR LENS PLACEMENT (IOC) RIGHT DIABETIC 9.66 00:53.7;  Surgeon:  Galen Manila, MD;  Location: [facility];  Service: Ophthalmology;  Laterality: Right;  Diabetic   CHOLECYSTECTOMY  11/20/1991   COLONOSCOPY WITH PROPOFOL N/A 02/02/2019   Procedure: COLONOSCOPY WITH PROPOFOL;  Surgeon: Scot Jun, MD;  Location: [facility] END[facility]COPY;  Service: Endoscopy;  Laterality: N/A;   ESOPHAGOGASTRODUODENOSCOPY N/A 02/02/2019   Procedure: ESOPHAGOGASTRODUODENOSCOPY (EGD);  Surgeon: Scot Jun, MD;  Location: [facility] ENDOSCOPY;  Service: Endoscopy;  Laterality: N/A;   FOOT SURGERY  06/20/2007   Left   right carotid artery surgery  01/09/2013   Dr. Gilda Crease @ [facility]   TOTAL HIP ARTHROPLASTY  05/03/2011   left 12, right 11/07    FAMILY HISTORY :   Family History  Problem Relation Age of Onset   Hyperlipidemia Father    Heart disease Father        myocardial infarction   Hypertension Father        Parent   Arthritis Other        parent   Diabetes Other        nephew   Cervical cancer Sister    Rectal cancer Sister    Breast cancer Neg Hx     SOCIAL HISTORY:   Social History   Tobacco Use   Smoking status: Former    Current packs/day: 0.00    Types: Cigarettes    Quit date: 11/19/1989    Years since quitting: 33.6   Smokeless tobacco: Never  Vaping Use   Vaping status: Never Used  Substance Use Topics   Alcohol use: No    Alcohol/week: 0.0 standard drinks of alcohol   Drug use: No    ALLERGIES:  is allergic to propofol, ciprofloxacin, levaquin [levofloxacin], and tequin [gatifloxacin].  MEDICATIONS:  Current Outpatient Medications  Medication Sig Dispense Refill   aspirin 81 MG tablet Take 81 mg by mouth daily.     Biotin 5000 MCG CAPS Take 1 capsule by mouth daily.     cetirizine (ZYRTEC) 10 MG tablet Take 10 mg by mouth daily.     docusate sodium (COLACE) 100 MG capsule Take 100 mg by mouth daily as needed for mild constipation.     ferrous sulfate 325 (65 FE) MG EC tablet Take 325 mg by mouth every other day. Every other  day     Lancet Device MISC Use as directed to check blood sugars 1 each 0   Lancets (ONETOUCH DELICA PLUS LANCET30G) MISC USE AS INSTRUCTED TO CHECK BLOOD SUGARS ONCE DAILY. DX E 11.9 100 each 12   Omega-3 Fatty Acids (FISH OIL) 1200 MG CAPS Take 1 capsule by mouth daily.     Probiotic, Lactobacillus, CAPS Take 1 capsule by mouth daily.     rosuvastatin (CRESTOR) 40 MG tablet TAKE 1 TABLET BY MOUTH EVERY DAY 90 tablet 1   traZODone (DESYREL) 50 MG tablet TAKE 1/2 TO 1 TABLET BY MOUTH AT BEDTIME AS NEEDED FOR SLEEP 90 tablet 1   triamcinolone cream (KENALOG) 0.1 % Apply 1 Application topically 2 (two) times daily. On affected area on leg.  Use for 7-10 days only - then stop. 30 g 0   guaiFENesin-dextromethorphan (ROBITUSSIN DM) 100-10 MG/5ML syrup Take 5 mLs by mouth every 4 (four) hours as needed for cough. (Patient not taking: Reported on 07/08/2023) 118 mL 0   No  current facility-administered medications for this visit.   Facility-Administered Medications Ordered in Other Visits  Medication Dose Route Frequency Provider Last Rate Last Admin   epoetin alfa-epbx (RETACRIT) injection 40,000 Units  40,000 Units Subcutaneous Once Louretta Shorten R, MD        PHYSICAL EXAMINATION:   BP 105/61 (BP Location: Left Arm, Patient Position: Sitting, Cuff Size: Normal)   Pulse 72   Temp (!) 97.4 F (36.3 C) (Tympanic)   Ht 4\' 11"  (1.499 m)   Wt 136 lb 11.2 oz (62 kg)   SpO2 94%   BMI 27.61 kg/m   Filed Weights   07/08/23 1256  Weight: 136 lb 11.2 oz (62 kg)      Physical Exam Constitutional:      Comments: [place] Caucasian female patient.  She is in a wheelchair.  HENT:     Head: Normocephalic and atraumatic.     Mouth/Throat:     Pharynx: No oropharyngeal exudate.  Eyes:     Pupils: Pupils are equal, round, and reactive to light.     Comments: Positive for pallor.  Cardiovascular:     Rate and Rhythm: Normal rate and regular rhythm.     Heart sounds: Murmur heard.   Pulmonary:     Effort: No respiratory distress.     Breath sounds: No wheezing.  Abdominal:     General: Bowel sounds are normal. There is no distension.     Palpations: Abdomen is soft. There is no mass.     Tenderness: There is no abdominal tenderness. There is no guarding or rebound.  Musculoskeletal:        General: No tenderness. Normal range of motion.     Cervical back: Normal range of motion and neck supple.  Skin:    General: Skin is warm.  Neurological:     Mental Status: She is alert and oriented to person, place, and time.  Psychiatric:        Mood and Affect: Affect normal.     LABORATORY DATA:  I have reviewed the data as listed    Component Value Date/Time   NA 135 07/08/2023 1240   NA 143 01/10/2013 0314   K 4.3 07/08/2023 1240   K 4.0 01/10/2013 0314   CL 102 07/08/2023 1240   CL 109 (H) 01/10/2013 0314   CO2 26 07/08/2023 1240   CO2 27 01/10/2013 0314   GLUCOSE 101 (H) 07/08/2023 1240   GLUCOSE 119 (H) 01/10/2013 0314   BUN 20 07/08/2023 1240   BUN 15 01/10/2013 0314   CREATININE 1.11 (H) 07/08/2023 1240   CREATININE 1.03 01/10/2013 0314   CREATININE 1.13 (H) 09/18/2012 0848   CALCIUM 9.0 07/08/2023 1240   CALCIUM 8.1 (L) 01/10/2013 0314   PROT 6.3 (L) 07/08/2023 1240   PROT 7.2 12/20/2011 1521   ALBUMIN 3.4 (L) 07/08/2023 1240   ALBUMIN 3.9 12/20/2011 1521   AST 19 07/08/2023 1240   ALT 15 07/08/2023 1240   ALT 37 12/20/2011 1521   ALKPHOS 43 07/08/2023 1240   ALKPHOS 57 12/20/2011 1521   BILITOT 0.5 07/08/2023 1240   GFRNONAA 48 (L) 07/08/2023 1240   GFRNONAA 52 (L) 01/10/2013 0314   GFRNONAA 47 (L) 09/18/2012 0848   GFRAA 46 (L) 07/12/2020 1240   GFRAA >60 01/10/2013 0314   GFRAA 54 (L) 09/18/2012 0848    No results found for: "SPEP", "UPEP"  Lab Results  Component Value Date   WBC 5.3 07/08/2023   NEUTROABS 3.7 07/08/2023  HGB 9.5 (L) 07/08/2023   HCT 30.9 (L) 07/08/2023   MCV 106.9 (H) 07/08/2023   PLT 194 07/08/2023       Chemistry      Component Value Date/Time   NA 135 07/08/2023 1240   NA 143 01/10/2013 0314   K 4.3 07/08/2023 1240   K 4.0 01/10/2013 0314   CL 102 07/08/2023 1240   CL 109 (H) 01/10/2013 0314   CO2 26 07/08/2023 1240   CO2 27 01/10/2013 0314   BUN 20 07/08/2023 1240   BUN 15 01/10/2013 0314   CREATININE 1.11 (H) 07/08/2023 1240   CREATININE 1.03 01/10/2013 0314   CREATININE 1.13 (H) 09/18/2012 0848      Component Value Date/Time   CALCIUM 9.0 07/08/2023 1240   CALCIUM 8.1 (L) 01/10/2013 0314   ALKPHOS 43 07/08/2023 1240   ALKPHOS 57 12/20/2011 1521   AST 19 07/08/2023 1240   ALT 15 07/08/2023 1240   ALT 37 12/20/2011 1521   BILITOT 0.5 07/08/2023 1240      ASSESSMENT & PLAN:   Anemia of chronic kidney failure, stage 3 (moderate) (HCC) #Severe anemia hemoglobin-nadir 6 [summer 2020]. 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 JUNE 2023- NEGATIVE for hemolysis [haptoglobin-WNL]  # Today hemoglobin is  9.5 Proceed with Retacrit today.- MARCH 2024-  I sat-27 ferritin-71[name]- monthly; iron studies/ferritin- pending. Plan Ferrahem in 1 week.   # B12 def- FEB 2023- 232; continue B12 injections every monthly. [march 2023] ; B12 levels - 723 [NOV 2023] Proceed with injection today.  Stable.   # CKD stage III- Recommend continued p.o. intake.  Stable.   # TIA: on plavix [Dr.Shah]- stable.   Monthly B12; Ferra/retacrit q m  # DISPOSITION:  # HOLD Ferrahem today;  # proceed with RETACRIT;  B 12 injection # in 2 weeks- ferrahem  # in 4 weeks- labs- H&H; possible retacrit; b12 injefction # in 8 weeks-  MD-  labs- -cbc;bmp;reticulocyte count; haptoglobin;  possible Ferrahem or possible retacrit; B12 injection- Dr.B    Earna Coder, MD 07/08/2023 1:55 PM

## 2023-07-08 NOTE — Assessment & Plan Note (Addendum)
#  Severe anemia hemoglobin-nadir 6 [summer 2020]. 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 JUNE 2023- NEGATIVE for hemolysis [haptoglobin-WNL]  # Today hemoglobin is  9.5 Proceed with Retacrit today.- MARCH 2024-  I sat-27 ferritin-71Etta Elliott- monthly; iron studies/ferritin- pending. Plan Ferrahem in 1 week.   # B12 def- FEB 2023- 232; continue B12 injections every monthly. [march 2023] ; B12 levels - 723 [NOV 2023] Proceed with injection today.  Stable.   # CKD stage III- Recommend continued p.o. intake.  Stable.   # TIA: on plavix [Dr.Shah]- stable.   Monthly B12; Ferra/retacrit q m  # DISPOSITION:  # HOLD Ferrahem today;  # proceed with RETACRIT;  B 12 injection # in 2 weeks- ferrahem  # in 4 weeks- labs- H&H; possible retacrit; b12 injefction # in 8 weeks-  MD-  labs- -cbc;bmp;reticulocyte count; haptoglobin;  possible Ferrahem or possible retacrit; B12 injection- Dr.B

## 2023-07-08 NOTE — Progress Notes (Signed)
Fatigue/weakness: no Dyspena: yes with exertion Light headedness: no Blood in stool: no   Had covid 06/15/23.  Has trouble sleeping, takes trazodone.

## 2023-07-10 ENCOUNTER — Other Ambulatory Visit: Payer: Medicare Other

## 2023-07-10 ENCOUNTER — Ambulatory Visit: Payer: Medicare Other | Admitting: Internal Medicine

## 2023-07-10 ENCOUNTER — Ambulatory Visit: Payer: Medicare Other

## 2023-07-24 ENCOUNTER — Inpatient Hospital Stay: Payer: Medicare Other | Attending: Internal Medicine

## 2023-07-24 DIAGNOSIS — N183 Chronic kidney disease, stage 3 unspecified: Secondary | ICD-10-CM | POA: Insufficient documentation

## 2023-07-24 DIAGNOSIS — D631 Anemia in chronic kidney disease: Secondary | ICD-10-CM | POA: Insufficient documentation

## 2023-07-24 DIAGNOSIS — E538 Deficiency of other specified B group vitamins: Secondary | ICD-10-CM | POA: Insufficient documentation

## 2023-08-05 ENCOUNTER — Inpatient Hospital Stay: Payer: Medicare Other

## 2023-08-05 ENCOUNTER — Other Ambulatory Visit: Payer: Self-pay | Admitting: Internal Medicine

## 2023-08-05 VITALS — BP 93/47 | HR 71

## 2023-08-05 DIAGNOSIS — D631 Anemia in chronic kidney disease: Secondary | ICD-10-CM | POA: Diagnosis present

## 2023-08-05 DIAGNOSIS — N183 Chronic kidney disease, stage 3 unspecified: Secondary | ICD-10-CM

## 2023-08-05 DIAGNOSIS — D649 Anemia, unspecified: Secondary | ICD-10-CM

## 2023-08-05 DIAGNOSIS — E538 Deficiency of other specified B group vitamins: Secondary | ICD-10-CM | POA: Diagnosis not present

## 2023-08-05 LAB — HEMOGLOBIN AND HEMATOCRIT (CANCER CENTER ONLY)
HCT: 31.1 % — ABNORMAL LOW (ref 36.0–46.0)
Hemoglobin: 9.6 g/dL — ABNORMAL LOW (ref 12.0–15.0)

## 2023-08-05 MED ORDER — CYANOCOBALAMIN 1000 MCG/ML IJ SOLN
1000.0000 ug | Freq: Once | INTRAMUSCULAR | Status: AC
  Administered 2023-08-05: 1000 ug via INTRAMUSCULAR
  Filled 2023-08-05: qty 1

## 2023-08-05 MED ORDER — EPOETIN ALFA-EPBX 40000 UNIT/ML IJ SOLN
40000.0000 [IU] | Freq: Once | INTRAMUSCULAR | Status: AC
  Administered 2023-08-05: 40000 [IU] via SUBCUTANEOUS
  Filled 2023-08-05: qty 1

## 2023-08-06 ENCOUNTER — Other Ambulatory Visit: Payer: Self-pay | Admitting: Internal Medicine

## 2023-08-06 DIAGNOSIS — Z1231 Encounter for screening mammogram for malignant neoplasm of breast: Secondary | ICD-10-CM

## 2023-08-07 ENCOUNTER — Ambulatory Visit: Payer: Medicare Other | Admitting: Dermatology

## 2023-08-07 ENCOUNTER — Encounter: Payer: Self-pay | Admitting: Dermatology

## 2023-08-07 VITALS — BP 97/53 | HR 77

## 2023-08-07 DIAGNOSIS — R208 Other disturbances of skin sensation: Secondary | ICD-10-CM | POA: Diagnosis not present

## 2023-08-07 DIAGNOSIS — L820 Inflamed seborrheic keratosis: Secondary | ICD-10-CM | POA: Diagnosis not present

## 2023-08-07 DIAGNOSIS — D489 Neoplasm of uncertain behavior, unspecified: Secondary | ICD-10-CM

## 2023-08-07 DIAGNOSIS — L821 Other seborrheic keratosis: Secondary | ICD-10-CM

## 2023-08-07 DIAGNOSIS — B078 Other viral warts: Secondary | ICD-10-CM | POA: Diagnosis not present

## 2023-08-07 DIAGNOSIS — C44319 Basal cell carcinoma of skin of other parts of face: Secondary | ICD-10-CM | POA: Diagnosis not present

## 2023-08-07 DIAGNOSIS — I872 Venous insufficiency (chronic) (peripheral): Secondary | ICD-10-CM

## 2023-08-07 DIAGNOSIS — C4491 Basal cell carcinoma of skin, unspecified: Secondary | ICD-10-CM

## 2023-08-07 HISTORY — DX: Basal cell carcinoma of skin, unspecified: C44.91

## 2023-08-07 MED ORDER — HYDROCORTISONE 2.5 % EX CREA
TOPICAL_CREAM | CUTANEOUS | 5 refills | Status: AC
Start: 2023-08-07 — End: ?

## 2023-08-07 NOTE — Patient Instructions (Addendum)
Biopsy Wound Care Instructions  Leave the original bandage on for 24 hours if possible.  If the bandage becomes soaked or soiled before that time, it is OK to remove it and examine the wound.  A small amount of post-operative bleeding is normal.  If excessive bleeding occurs, remove the bandage, place gauze over the site and apply continuous pressure (no peeking) over the area for 30 minutes. If this does not work, please call our clinic as soon as possible or page your doctor if it is after hours.   Once a day, cleanse the wound with soap and water. It is fine to shower. If a thick crust develops you may use a Q-tip dipped into dilute hydrogen peroxide (mix 1:1 with water) to dissolve it.  Hydrogen peroxide can slow the healing process, so use it only as needed.    After washing, apply petroleum jelly (Vaseline) or an antibiotic ointment if your doctor prescribed one for you, followed by a bandage.    For best healing, the wound should be covered with a layer of ointment at all times. If you are not able to keep the area covered with a bandage to hold the ointment in place, this may mean re-applying the ointment several times a day.  Continue this wound care until the wound has healed and is no longer open.   Itching and mild discomfort is normal during the healing process. However, if you develop pain or severe itching, please call our office.   If you have any discomfort, you can take Tylenol (acetaminophen) or ibuprofen as directed on the bottle. (Please do not take these if you have an allergy to them or cannot take them for another reason).  Some redness, tenderness and white or yellow material in the wound is normal healing.  If the area becomes very sore and red, or develops a thick yellow-green material (pus), it may be infected; please notify us.    If you have stitches, return to clinic as directed to have the stitches removed. You will continue wound care for 2-3 days after the stitches  are removed.   Wound healing continues for up to one year following surgery. It is not unusual to experience pain in the scar from time to time during the interval.  If the pain becomes severe or the scar thickens, you should notify the office.    A slight amount of redness in a scar is expected for the first six months.  After six months, the redness will fade and the scar will soften and fade.  The color difference becomes less noticeable with time.  If there are any problems, return for a post-op surgery check at your earliest convenience.  To improve the appearance of the scar, you can use silicone scar gel, cream, or sheets (such as Mederma or Serica) every night for up to one year. These are available over the counter (without a prescription).  Please call our office at [phone] for any questions or concerns.   Seborrheic Keratosis  What causes seborrheic keratoses? Seborrheic keratoses are harmless, common skin growths that first appear during adult life.  As time goes by, more growths appear.  Some people may develop a large number of them.  Seborrheic keratoses appear on both covered and uncovered body parts.  They are not caused by sunlight.  The tendency to develop seborrheic keratoses can be inherited.  They vary in color from skin-colored to gray, brown, or even black.  They can be  either smooth or have a rough, warty surface.   Seborrheic keratoses are superficial and look as if they were stuck on the skin.  Under the microscope this type of keratosis looks like layers upon layers of skin.  That is why at times the top layer may seem to fall off, but the rest of the growth remains and re-grows.    Treatment Seborrheic keratoses do not need to be treated, but can easily be removed in the office.  Seborrheic keratoses often cause symptoms when they rub on clothing or jewelry.  Lesions can be in the way of shaving.  If they become inflamed, they can cause itching, soreness, or  burning.  Removal of a seborrheic keratosis can be accomplished by freezing, burning, or surgery. If any spot bleeds, scabs, or grows rapidly, please return to have it checked, as these can be an indication of a skin cancer.   Cryotherapy Aftercare  Wash gently with soap and water everyday.   Apply Vaseline and Band-Aid daily until healed.    Due to recent changes in healthcare laws, you may see results of your pathology and/or laboratory studies on MyChart before the doctors have had a chance to review them. We understand that in some cases there may be results that are confusing or concerning to you. Please understand that not all results are received at the same time and often the doctors may need to interpret multiple results in order to provide you with the best plan of care or course of treatment. Therefore, we ask that you please give [place] 2 business days to thoroughly review all your results before contacting the office for clarification. Should we see a critical lab result, you will be contacted sooner.   If You Need Anything After Your Visit  If you have any questions or concerns for your doctor, please call our main line at [phone] and press option 4 to reach your doctor's medical assistant. If no one answers, please leave a voicemail as directed and we will return your call as soon as possible. Messages left after 4 pm will be answered the following business day.   You may also send [place] a message via MyChart. We typically respond to MyChart messages within 1-2 business days.  For prescription refills, please ask your pharmacy to contact our office. Our fax number is [phone].  If you have an urgent issue when the clinic is closed that cannot wait until the next business day, you can page your doctor at the number below.    Please note that while we do our best to be available for urgent issues outside of office hours, we are not available 24/7.   If you have an urgent issue  and are unable to reach [place], you may choose to seek medical care at your doctor's office, retail clinic, urgent care center, or emergency room.  If you have a medical emergency, please immediately call 911 or go to the emergency department.  Pager Numbers  - Dr. Gwen Pounds: [phone]  - Dr. Roseanne Reno: [phone]  - Dr. Katrinka Blazing: [phone]   In the event of inclement weather, please call our main line at [phone] for an update on the status of any delays or closures.  Dermatology Medication Tips: Please keep the boxes that topical medications come in in order to help keep track of the instructions about where and how to use these. Pharmacies typically print the medication instructions only on the boxes and not directly on the medication tubes.  If your medication is too expensive, please contact our office at [phone] option 4 or send [place] a message through MyChart.   We are unable to tell what your co-pay for medications will be in advance as this is different depending on your insurance coverage. However, we may be able to find a substitute medication at lower cost or fill out paperwork to get insurance to cover a needed medication.   If a prior authorization is required to get your medication covered by your insurance company, please allow [place] 1-2 business days to complete this process.  Drug prices often vary depending on where the prescription is filled and some pharmacies may offer cheaper prices.  The website www.goodrx.com contains coupons for medications through different pharmacies. The prices here do not account for what the cost may be with help from insurance (it may be cheaper with your insurance), but the website can give you the price if you did not use any insurance.  - You can print the associated coupon and take it with your prescription to the pharmacy.  - You may also stop by our office during regular business hours and pick up a GoodRx coupon card.  - If you  need your prescription sent electronically to a different pharmacy, notify our office through [facility] or by phone at [phone] option 4.     Si Usted Necesita Algo Despus de Su Visita  Tambin puede enviarnos un mensaje a travs de Clinical cytogeneticist. Por lo general respondemos a los mensajes de MyChart en el transcurso de 1 a 2 das hbiles.  Para renovar recetas, por favor pida a su farmacia que se ponga en contacto con nuestra oficina. Annie Sable de fax es [place] [phone].  Si tiene un asunto urgente cuando la clnica est cerrada y que no puede esperar hasta el siguiente da hbil, puede llamar/localizar a su doctor(a) al nmero que aparece a continuacin.   Por favor, tenga en cuenta que aunque hacemos todo lo posible para estar disponibles para asuntos urgentes fuera del horario de [place], no estamos disponibles las 24 horas del da, los 7 [address]  [address] de la [place].   Si tiene un problema urgente y no puede comunicarse con nosotros, puede optar por buscar atencin mdica  en el consultorio de su doctor(a), en una clnica privada, en un centro de atencin urgente o en una sala de emergencias.  Si tiene Engineer, drilling, por favor llame inmediatamente al 911 o vaya a la sala de emergencias.  Nmeros de bper  - Dr. Gwen Pounds: [phone]  - Dra. Roseanne Reno: [phone]  - Dr. Katrinka Blazing: [phone]   En caso de inclemencias del tiempo, por favor llame a Lacy Duverney principal al [phone] para una actualizacin sobre el [place] de cualquier retraso o cierre.  Consejos para la medicacin en dermatologa: Por favor, guarde las cajas en las que vienen los medicamentos de uso tpico para ayudarle a seguir las instrucciones sobre dnde y cmo usarlos. Las farmacias generalmente imprimen las instrucciones del medicamento slo en las cajas y no directamente en los tubos del [place].   Si su medicamento es muy caro, por favor, pngase en contacto con Rolm Gala llamando al  [phone] y presione la opcin 4 o envenos un mensaje a travs de Clinical cytogeneticist.   No podemos decirle cul ser su copago por los medicamentos por adelantado ya que esto es diferente dependiendo de la cobertura de su seguro. Sin embargo, es posible que podamos encontrar un medicamento sustituto a Audiological scientist  un formulario para que el seguro cubra el medicamento que se considera necesario.   Si se requiere una autorizacin previa para que su compaa de seguros [place] su medicamento, por favor permtanos de 1 a 2 das hbiles para completar [address]  Los precios de los medicamentos varan con frecuencia dependiendo del Environmental consultant de dnde se surte la receta y alguna farmacias pueden ofrecer precios ms baratos.  El sitio web www.goodrx.com tiene cupones para medicamentos de Health and safety inspector. Los precios aqu no tienen en cuenta lo que podra costar con la ayuda del seguro (puede ser ms barato con su seguro), pero el sitio web puede darle el precio si no utiliz Tourist information centre manager.  - Puede imprimir el cupn correspondiente y llevarlo con su receta a la farmacia.  - Tambin puede pasar por nuestra oficina durante el horario de atencin regular y Education officer, museum una tarjeta de cupones de GoodRx.  - Si necesita que su receta se enve electrnicamente a una farmacia diferente, informe a nuestra oficina a travs de MyChart de [place] o por telfono llamando al [phone] y presione la opcin 4.

## 2023-08-07 NOTE — Progress Notes (Signed)
Follow-Up Visit   Subjective  Jocelyn Elliott is a 89 y.o. female who presents for the following: patient here with daughter concering some spots she would like checked at forehead, right ear, left jaw area, left side of nose, chin, right leg, chest, right hand, right shoulder area and scalp   Forehead spot present x 2 years without healing. Scab scratched off today  R ear lesion grew quickly and is tender  Has chronic scalp pruritus  The patient has spots, moles and lesions to be evaluated, some may be new or changing and the patient may have concern these could be cancer.   The following portions of the chart were reviewed this encounter and updated as appropriate: medications, allergies, medical history  Review of Systems:  No other skin or systemic complaints except as noted in HPI or Assessment and Plan.  Objective  Well appearing patient in no apparent distress; mood and affect are within normal limits.   A focused examination was performed of the following areas: Right ear, forehead, left jaw, face, scalp, chest, right leg, back  Relevant exam findings are noted in the Assessment and Plan.  central upper forehead 4 mm eroded pink papule      right antihelix ear 7 mm pink keratotic papule        Right Upper Back x 1, right mid back x 2, left jaw line x 1 , right hand x 1 Erythematous stuck-on, waxy papule or plaque    Assessment & Plan   SEBORRHEIC KERATOSIS At left jaw, chest, forehead, right hand  - Stuck-on, waxy, tan-brown papules and/or plaques  - Benign-appearing - Discussed benign etiology and prognosis. - Observe - Call for any changes  Stasis DERMATITIS Exam: Scaly patch on background of venous stasis on right lower anterior leg   Chronic and persistent condition with duration or expected duration over one year. Condition is bothersome/symptomatic for patient. Currently flared.  Treatment Plan: Start hydrocortisone 2.5 % cream apply  topically  2 times daily as needed for itch at right lower leg   Topical steroids (such as triamcinolone, fluocinolone, fluocinonide, mometasone, clobetasol, halobetasol, betamethasone, hydrocortisone) can cause thinning and lightening of the skin if they are used for too long in the same area. Your physician has selected the right strength medicine for your problem and area affected on the body. Please use your medication only as directed by your physician to prevent side effects.   Recommend gentle skin care.  Scalp dysesthesia Exam: normal skin  05/06/2012 CT angio neck: "There is cervical spondylosis. There is degenerative disc disease of the  cervical spine most significant at C5-C6 and C6-C7. There is severe right  facet arthropathy at C3-C4 and C4-C5 on the right. There is facet  arthropathy at C3-C4 on the left."  Plan: Most likely secondary to cervical spine disease (as seen on CT) that is causing neurogenic pruritus Anti itch creams prn for pruritus  Neoplasm of uncertain behavior (2) central upper forehead  Skin / nail biopsy Type of biopsy: tangential   Informed consent: discussed and consent obtained   Patient was prepped and draped in usual sterile fashion: Area prepped with alcohol. Anesthesia: the lesion was anesthetized in a standard fashion   Anesthetic:  1% lidocaine w/ epinephrine 1-100,000 buffered w/ 8.4% NaHCO3 Instrument used: DermaBlade   Hemostasis achieved with: pressure and aluminum chloride   Outcome: patient tolerated procedure well   Post-procedure details: wound care instructions given   Post-procedure details comment:  Ointment  and small bandage applied  Specimen 1 - Surgical pathology Differential Diagnosis: r/o bcc vs scc  Check Margins: No  right antihelix ear  Skin / nail biopsy Type of biopsy: tangential   Informed consent: discussed and consent obtained   Patient was prepped and draped in usual sterile fashion: Area prepped with  alcohol. Anesthesia: the lesion was anesthetized in a standard fashion   Anesthetic:  1% lidocaine w/ epinephrine 1-100,000 buffered w/ 8.4% NaHCO3 Instrument used: flexible razor blade   Hemostasis achieved with: pressure, aluminum chloride and electrodesiccation   Outcome: patient tolerated procedure well   Post-procedure details: wound care instructions given   Post-procedure details comment:  Ointment and small bandage applied  Specimen 2 - Surgical pathology Differential Diagnosis: r/o scc vs bcc vs verruca  Check Margins: No    Inflamed seborrheic keratosis Right Upper Back x 1, right mid back x 2, left jaw line x 1 , right hand x 1  Symptomatic, irritating, patient would like treated.  Destruction of lesion - Right Upper Back x 1, right mid back x 2, left jaw line x 1 , right hand x 1  Destruction method: cryotherapy   Informed consent: discussed and consent obtained   Lesion destroyed using liquid nitrogen: Yes   Region frozen until ice ball extended beyond lesion: Yes   Outcome: patient tolerated procedure well with no complications   Post-procedure details: wound care instructions given   Additional details:  Prior to procedure, discussed risks of blister formation, small wound, skin dyspigmentation, or rare scar following cryotherapy. Recommend Vaseline ointment to treated areas while healing.   Stasis dermatitis  Related Medications hydrocortisone 2.5 % cream apply topically 2 times daily as needed for itch at right lower leg  Dysesthesia of scalp     Return if symptoms worsen or fail to improve.  I, Asher Muir, CMA, am acting as scribe for Elie Goody, MD.   Documentation: I have reviewed the above documentation for accuracy and completeness, and I agree with the above.  Elie Goody, MD

## 2023-08-12 LAB — SURGICAL PATHOLOGY

## 2023-08-13 ENCOUNTER — Telehealth: Payer: Self-pay

## 2023-08-13 DIAGNOSIS — C44319 Basal cell carcinoma of skin of other parts of face: Secondary | ICD-10-CM

## 2023-08-13 NOTE — Telephone Encounter (Signed)
Called patient's daughter, [name]. Discussed pathology results and treatment plan. Patient's daughter prefers to go to [facility] due to closer location. Referral sent.

## 2023-08-13 NOTE — Telephone Encounter (Signed)
-----   Message from [facility] sent at 08/12/2023 10:25 PM EDT ----- Diagnosis:  1. Skin, central upper forehead :       BASAL CELL CARCINOMA, NODULAR PATTERN, ULCERATED        2. Skin, right antihelix ear :       VERRUCA VULGARIS, INFLAMED   For Site 1: Please call with diagnosis and determine where the patient would like to have Mohs surgery. If the patient asks for my recommendation for Mohs surgeon, please refer to [facility] (Dr Coralie Carpen and Dr Caprice Beaver)  Explanation: your biopsy shows a basal cell skin cancer in the second layer of the skin. This is the most common kind of skin cancer and is caused by damage from sun exposure. Basal cell skin cancers almost never spread beyond the skin, so they are not dangerous to your overall health. However, they will continue to grow, can bleed, cause nonhealing wounds, and disrupt nearby structures unless fully treated.  Treatment: Given the location and type of skin cancer, I recommend Mohs surgery. Mohs surgery involves cutting out the skin cancer and then checking under the microscope to ensure the whole skin cancer was removed. If any skin cancer remains, the surgeon will cut out more until it is fully removed. The cure rate is about 98-99%. Once the Mohs surgeon confirms the skin cancer is out, they will discuss the options to repair or heal the area. You must take it easy for about two weeks after surgery (no lifting over 10-15 lbs, avoid activity to get your heart rate and blood pressure up). It is done at another office outside of [place] ([place], [place], or [place]).  For site 2: Spot on ear was a wart. It may resolve after being removed by the biopsy. Allow area to heal and contact us if wart reforms.

## 2023-08-16 ENCOUNTER — Other Ambulatory Visit (INDEPENDENT_AMBULATORY_CARE_PROVIDER_SITE_OTHER): Payer: Self-pay | Admitting: Nurse Practitioner

## 2023-08-16 DIAGNOSIS — I779 Disorder of arteries and arterioles, unspecified: Secondary | ICD-10-CM

## 2023-08-19 ENCOUNTER — Inpatient Hospital Stay: Payer: Medicare Other

## 2023-08-19 VITALS — BP 122/66 | HR 75 | Temp 97.7°F | Resp 18

## 2023-08-19 DIAGNOSIS — N183 Chronic kidney disease, stage 3 unspecified: Secondary | ICD-10-CM | POA: Diagnosis not present

## 2023-08-19 DIAGNOSIS — D508 Other iron deficiency anemias: Secondary | ICD-10-CM

## 2023-08-19 MED ORDER — SODIUM CHLORIDE 0.9 % IV SOLN
Freq: Once | INTRAVENOUS | Status: AC
  Filled 2023-08-19: qty 250

## 2023-08-19 MED ORDER — SODIUM CHLORIDE 0.9 % IV SOLN
510.0000 mg | Freq: Once | INTRAVENOUS | Status: AC
  Administered 2023-08-19: 510 mg via INTRAVENOUS
  Filled 2023-08-19: qty 510

## 2023-08-21 ENCOUNTER — Ambulatory Visit
Admission: RE | Admit: 2023-08-21 | Discharge: 2023-08-21 | Disposition: A | Discharge status: Home / Self Care | Payer: Medicare Other | Source: Ambulatory Visit | Attending: Internal Medicine | Admitting: Internal Medicine

## 2023-08-21 DIAGNOSIS — Z1231 Encounter for screening mammogram for malignant neoplasm of breast: Secondary | ICD-10-CM | POA: Diagnosis present

## 2023-08-25 NOTE — Progress Notes (Signed)
MRN : [id number]  Jocelyn Elliott is a 89 y.o. (Sep 19, 1932) female who presents with chief complaint of check carotid arteries.  History of Present Illness:   The patient is seen for follow up evaluation of carotid stenosis. The carotid stenosis followed by ultrasound.   She is s/p right CEA 01/09/2013 and Left CEA 10/08/2002.   The patient denies amaurosis fugax. There is no recent history of TIA symptoms or focal motor deficits. There is no prior documented CVA.   The patient is taking enteric-coated aspirin 81 mg daily.   There is no history of migraine headaches. There is no history of seizures.   The patient has a history of coronary artery disease, no recent episodes of angina or shortness of breath. The patient denies PAD or claudication symptoms. There is a history of hyperlipidemia which is being treated with a statin.     Carotid Duplex done today shows patent bilateral CEA's with 1-39% RICA and 40-50% LICA restenosis.  No significant change compared to last study.    No outpatient medications have been marked as taking for the 08/26/23 encounter (Appointment) with Gilda Crease, Latina Craver, MD.    Past Medical History:  Diagnosis Date   Arthritis    AV malformation of gastrointestinal tract    Basal cell carcinoma 08/07/2023   Central upper forehead. Nodular pattern. Mohs pending.   Blood in stool    CAD (coronary artery disease)    Carotid arterial disease (HCC)    Cataracts, bilateral    Chronic blood loss anemia    Chronic cystitis    CKD (chronic kidney disease), stage III (HCC)    Complication of anesthesia    reaction to propofol - caused heart to pause   Depression    Diabetes mellitus (HCC)    GERD (gastroesophageal reflux disease)    Hyperlipidemia    Hypertension    IDA (iron deficiency anemia)    Stress incontinence    TIA (transient ischemic attack) 03/2021   No deficits   Wears dentures    full upper and lower    Past  Surgical History:  Procedure Laterality Date   ABDOMINAL HYSTERECTOMY  11/19/1970   ovaries left in place   APPENDECTOMY  11/19/1990   BACK SURGERY  06/08/2010   L3, L4, L5   CARDIAC CATHETERIZATION  02/2001   CAROTID ARTERY ANGIOPLASTY  09/19/2002   CARPAL TUNNEL RELEASE  1991, 92   right then left    CATARACT EXTRACTION W/PHACO Left 02/13/2022   Procedure: CATARACT EXTRACTION PHACO AND INTRAOCULAR LENS PLACEMENT (IOC) LEFT DIABETIC 14.90 01:26.8;  Surgeon: Galen Manila, MD;  Location: [facility];  Service: Ophthalmology;  Laterality: Left;  Diabetic   CATARACT EXTRACTION W/PHACO Right 03/06/2022   Procedure: CATARACT EXTRACTION PHACO AND INTRAOCULAR LENS PLACEMENT (IOC) RIGHT DIABETIC 9.66 00:53.7;  Surgeon: Galen Manila, MD;  Location: [facility];  Service: Ophthalmology;  Laterality: Right;  Diabetic   CHOLECYSTECTOMY  11/20/1991   COLONOSCOPY WITH PROPOFOL N/A 02/02/2019   Procedure: COLONOSCOPY WITH PROPOFOL;  Surgeon: Scot Jun, MD;  Location: [facility] END[facility];  Service: Endoscopy;  Laterality: N/A;   ESOPHAGOGASTRODUODENOSCOPY N/A 02/02/2019   Procedure: ESOPHAGOGASTRODUODENOSCOPY (EGD);  Surgeon: Scot Jun, MD;  Location: [facility] ENDOSCOPY;  Service: Endoscopy;  Laterality: N/A;   FOOT SURGERY  06/20/2007   Left   right carotid artery surgery  01/09/2013   Dr. Gilda Crease @ [facility]   TOTAL HIP ARTHROPLASTY  05/03/2011   left 12, right 11/07  Social History Social History   Tobacco Use   Smoking status: Former    Current packs/day: 0.00    Types: Cigarettes    Quit date: 11/19/1989    Years since quitting: 33.7   Smokeless tobacco: Never  Vaping Use   Vaping status: Never Used  Substance Use Topics   Alcohol use: No    Alcohol/week: 0.0 standard drinks of alcohol   Drug use: No    Family History Family History  Problem Relation Age of Onset   Hyperlipidemia Father    Heart disease Father        myocardial infarction    Hypertension Father        Parent   Arthritis Other        parent   Diabetes Other        nephew   Cervical cancer Sister    Rectal cancer Sister    Breast cancer Neg Hx     Allergies  Allergen Reactions   Propofol Other (See Comments)    Caused heart to pause   Ciprofloxacin Rash   Levaquin [Levofloxacin] Rash   Tequin [Gatifloxacin] Hives and Rash     REVIEW OF SYSTEMS (Negative unless checked)  Constitutional: [] Weight loss  [] Fever  [] Chills Cardiac: [] Chest pain   [] Chest pressure   [] Palpitations   [] Shortness of breath when laying flat   [] Shortness of breath with exertion. Vascular:  [x] Pain in legs with walking   [] Pain in legs at rest  [] History of DVT   [] Phlebitis   [] Swelling in legs   [] Varicose veins   [] Non-healing ulcers Pulmonary:   [] Uses home oxygen   [] Productive cough   [] Hemoptysis   [] Wheeze  [] COPD   [] Asthma Neurologic:  [] Dizziness   [] Seizures   [] History of stroke   [] History of TIA  [] Aphasia   [] Vissual changes   [] Weakness or numbness in arm   [] Weakness or numbness in leg Musculoskeletal:   [] Joint swelling   [] Joint pain   [] Low back pain Hematologic:  [] Easy bruising  [] Easy bleeding   [] Hypercoagulable state   [] Anemic Gastrointestinal:  [] Diarrhea   [] Vomiting  [] Gastroesophageal reflux/heartburn   [] Difficulty swallowing. Genitourinary:  [] Chronic kidney disease   [] Difficult urination  [] Frequent urination   [] Blood in urine Skin:  [] Rashes   [] Ulcers  Psychological:  [] History of anxiety   []  History of major depression.  Physical Examination  There were no vitals filed for this visit. There is no height or weight on file to calculate BMI. Gen: WD/WN, NAD Head: Evan/AT, No temporalis wasting.  Ear/Nose/Throat: Hearing grossly intact, nares w/o erythema or drainage Eyes: PER, EOMI, sclera nonicteric.  Neck: Supple, no masses.  No bruit or JVD.  Pulmonary:  Good air movement, no audible wheezing, no use of accessory muscles.  Cardiac:  RRR, normal S1, S2, no Murmurs. Vascular:  carotid bruit noted bilateral well-healed carotid endarterectomy incisional scars Vessel Right Left  Radial Palpable Palpable  Carotid  Palpable  Palpable  Subclav  Palpable Palpable  Gastrointestinal: soft, non-distended. No guarding/no peritoneal signs.  Musculoskeletal: M/S 5/5 throughout.  No visible deformity.  Neurologic: CN 2-12 intact. Pain and light touch intact in extremities.  Symmetrical.  Speech is fluent. Motor exam as listed above. Psychiatric: Judgment intact, Mood & affect appropriate for pt's clinical situation. Dermatologic: No rashes or ulcers noted.  No changes consistent with cellulitis.   CBC Lab Results  Component Value Date   WBC 5.3 07/08/2023   HGB 9.6 (L) 08/05/2023  HCT 31.1 (L) 08/05/2023   MCV 106.9 (H) 07/08/2023   PLT 194 07/08/2023    BMET    Component Value Date/Time   NA 135 07/08/2023 1240   NA 143 01/10/2013 0314   K 4.3 07/08/2023 1240   K 4.0 01/10/2013 0314   CL 102 07/08/2023 1240   CL 109 (H) 01/10/2013 0314   CO2 26 07/08/2023 1240   CO2 27 01/10/2013 0314   GLUCOSE 101 (H) 07/08/2023 1240   GLUCOSE 119 (H) 01/10/2013 0314   BUN 20 07/08/2023 1240   BUN 15 01/10/2013 0314   CREATININE 1.11 (H) 07/08/2023 1240   CREATININE 1.03 01/10/2013 0314   CREATININE 1.13 (H) 09/18/2012 0848   CALCIUM 9.0 07/08/2023 1240   CALCIUM 8.1 (L) 01/10/2013 0314   GFRNONAA 48 (L) 07/08/2023 1240   GFRNONAA 52 (L) 01/10/2013 0314   GFRNONAA 47 (L) 09/18/2012 0848   GFRAA 46 (L) 07/12/2020 1240   GFRAA >60 01/10/2013 0314   GFRAA 54 (L) 09/18/2012 0848   CrCl cannot be calculated (Patient's most recent lab result is older than the maximum 21 days allowed.).  COAG Lab Results  Component Value Date   INR 1.2 02/28/2023   INR 1.0 06/15/2019   INR 1.0 01/10/2013    Radiology MM 3D SCREENING MAMMOGRAM BILATERAL BREAST  Result Date: 08/23/2023 CLINICAL DATA:  Screening. EXAM: DIGITAL SCREENING  BILATERAL MAMMOGRAM WITH TOMOSYNTHESIS AND CAD TECHNIQUE: Bilateral screening digital craniocaudal and mediolateral oblique mammograms were obtained. Bilateral screening digital breast tomosynthesis was performed. The images were evaluated with computer-aided detection. COMPARISON:  Previous exam(s). ACR Breast Density Category c: The breasts are heterogeneously dense, which may obscure small masses. FINDINGS: There are no findings suspicious for malignancy. IMPRESSION: No mammographic evidence of malignancy. A result letter of this screening mammogram will be mailed directly to the patient. RECOMMENDATION: Screening mammogram in one year. (Code:SM-B-01Y) BI-RADS CATEGORY  1: Negative. Electronically Signed   By: Edwin Cap M.D.   On: 08/23/2023 07:47     Assessment/Plan 1. Carotid artery disease, unspecified laterality, unspecified type (HCC) Recommend:  Given the patient's asymptomatic subcritical stenosis no further invasive testing or surgery at this time.  Carotid Duplex done today shows patent bilateral CEA's with 1-39% RICA and 40-50% LICA restenosis.  No significant change compared to last study.  Continue antiplatelet therapy as prescribed Continue management of CAD, HTN and Hyperlipidemia Healthy heart diet,  encouraged exercise at least 4 times per week Follow up in 12 months with duplex ultrasound and physical exam  - VAS US CAROTID - VAS US CAROTID; Future  2. Coronary artery disease involving native coronary artery of native heart without angina pectoris Continue cardiac and antihypertensive medications as already ordered and reviewed, no changes at this time.  Continue statin as ordered and reviewed, no changes at this time  Nitrates PRN for chest pain  3. Primary hypertension Continue antihypertensive medications as already ordered, these medications have been reviewed and there are no changes at this time.  4. Atrial fibrillation, unspecified type (HCC) Continue  antiarrhythmia medications as already ordered, these medications have been reviewed and there are no changes at this time.  Continue anticoagulation as ordered by Cardiology Service  5. Type 2 diabetes mellitus with other circulatory complication, without long-term current use of insulin (HCC) Continue hypoglycemic medications as already ordered, these medications have been reviewed and there are no changes at this time.  Hgb A1C to be monitored as already arranged by primary service  Levora Dredge, MD  08/25/2023 3:17 PM

## 2023-08-26 ENCOUNTER — Ambulatory Visit (INDEPENDENT_AMBULATORY_CARE_PROVIDER_SITE_OTHER): Payer: Medicare Other | Admitting: Vascular Surgery

## 2023-08-26 ENCOUNTER — Ambulatory Visit (INDEPENDENT_AMBULATORY_CARE_PROVIDER_SITE_OTHER): Payer: Medicare Other

## 2023-08-26 ENCOUNTER — Encounter (INDEPENDENT_AMBULATORY_CARE_PROVIDER_SITE_OTHER): Payer: Self-pay | Admitting: Vascular Surgery

## 2023-08-26 VITALS — BP 107/61 | HR 84 | Resp 18 | Ht 59.0 in | Wt 138.2 lb

## 2023-08-26 DIAGNOSIS — I779 Disorder of arteries and arterioles, unspecified: Secondary | ICD-10-CM | POA: Diagnosis not present

## 2023-08-26 DIAGNOSIS — I10 Essential (primary) hypertension: Secondary | ICD-10-CM | POA: Diagnosis not present

## 2023-08-26 DIAGNOSIS — I4891 Unspecified atrial fibrillation: Secondary | ICD-10-CM | POA: Diagnosis not present

## 2023-08-26 DIAGNOSIS — I2510 Atherosclerotic heart disease of native coronary artery without angina pectoris: Secondary | ICD-10-CM | POA: Diagnosis not present

## 2023-08-26 DIAGNOSIS — E1159 Type 2 diabetes mellitus with other circulatory complications: Secondary | ICD-10-CM

## 2023-08-30 ENCOUNTER — Encounter (INDEPENDENT_AMBULATORY_CARE_PROVIDER_SITE_OTHER): Payer: Self-pay | Admitting: Vascular Surgery

## 2023-09-02 ENCOUNTER — Other Ambulatory Visit: Payer: Self-pay | Admitting: Emergency Medicine

## 2023-09-02 ENCOUNTER — Encounter: Payer: Self-pay | Admitting: Internal Medicine

## 2023-09-02 ENCOUNTER — Inpatient Hospital Stay (HOSPITAL_BASED_OUTPATIENT_CLINIC_OR_DEPARTMENT_OTHER): Payer: Medicare Other | Admitting: Internal Medicine

## 2023-09-02 ENCOUNTER — Inpatient Hospital Stay: Payer: Medicare Other | Attending: Internal Medicine

## 2023-09-02 ENCOUNTER — Inpatient Hospital Stay: Payer: Medicare Other

## 2023-09-02 VITALS — BP 109/61 | HR 81 | Temp 97.6°F | Ht 59.0 in | Wt 138.0 lb

## 2023-09-02 DIAGNOSIS — N183 Chronic kidney disease, stage 3 unspecified: Secondary | ICD-10-CM

## 2023-09-02 DIAGNOSIS — D631 Anemia in chronic kidney disease: Secondary | ICD-10-CM | POA: Insufficient documentation

## 2023-09-02 DIAGNOSIS — E538 Deficiency of other specified B group vitamins: Secondary | ICD-10-CM | POA: Insufficient documentation

## 2023-09-02 DIAGNOSIS — D649 Anemia, unspecified: Secondary | ICD-10-CM

## 2023-09-02 LAB — CBC WITH DIFFERENTIAL (CANCER CENTER ONLY)
Abs Immature Granulocytes: 0.07 10*3/uL (ref 0.00–0.07)
Basophils Absolute: 0.1 10*3/uL (ref 0.0–0.1)
Basophils Relative: 1 %
Eosinophils Absolute: 0.2 10*3/uL (ref 0.0–0.5)
Eosinophils Relative: 4 %
HCT: 31.2 % — ABNORMAL LOW (ref 36.0–46.0)
Hemoglobin: 9.8 g/dL — ABNORMAL LOW (ref 12.0–15.0)
Immature Granulocytes: 1 %
Lymphocytes Relative: 19 %
Lymphs Abs: 1.1 10*3/uL (ref 0.7–4.0)
MCH: 33.1 pg (ref 26.0–34.0)
MCHC: 31.4 g/dL (ref 30.0–36.0)
MCV: 105.4 fL — ABNORMAL HIGH (ref 80.0–100.0)
Monocytes Absolute: 0.3 10*3/uL (ref 0.1–1.0)
Monocytes Relative: 5 %
Neutro Abs: 4 10*3/uL (ref 1.7–7.7)
Neutrophils Relative %: 70 %
Platelet Count: 167 10*3/uL (ref 150–400)
RBC: 2.96 MIL/uL — ABNORMAL LOW (ref 3.87–5.11)
RDW: 14.9 % (ref 11.5–15.5)
WBC Count: 5.7 10*3/uL (ref 4.0–10.5)
nRBC: 0 % (ref 0.0–0.2)

## 2023-09-02 LAB — BASIC METABOLIC PANEL
Anion gap: 5 (ref 5–15)
BUN: 22 mg/dL (ref 8–23)
CO2: 26 mmol/L (ref 22–32)
Calcium: 8.5 mg/dL — ABNORMAL LOW (ref 8.9–10.3)
Chloride: 105 mmol/L (ref 98–111)
Creatinine, Ser: 1.2 mg/dL — ABNORMAL HIGH (ref 0.44–1.00)
GFR, Estimated: 43 mL/min — ABNORMAL LOW (ref >60)
Glucose, Bld: 186 mg/dL — ABNORMAL HIGH (ref 70–99)
Potassium: 3.8 mmol/L (ref 3.5–5.1)
Sodium: 136 mmol/L (ref 135–145)

## 2023-09-02 LAB — RETICULOCYTES
Immature Retic Fract: 20.5 % — ABNORMAL HIGH (ref 2.3–15.9)
RBC.: 2.94 MIL/uL — ABNORMAL LOW (ref 3.87–5.11)
Retic Count, Absolute: 94.7 10*3/uL (ref 19.0–186.0)
Retic Ct Pct: 3.2 % — ABNORMAL HIGH (ref 0.4–3.1)

## 2023-09-02 MED ORDER — EPOETIN ALFA-EPBX 40000 UNIT/ML IJ SOLN
40000.0000 [IU] | Freq: Once | INTRAMUSCULAR | Status: AC
  Administered 2023-09-02: 40000 [IU] via SUBCUTANEOUS

## 2023-09-02 MED ORDER — CYANOCOBALAMIN 1000 MCG/ML IJ SOLN
1000.0000 ug | Freq: Once | INTRAMUSCULAR | Status: AC
  Administered 2023-09-02: 1000 ug via INTRAMUSCULAR

## 2023-09-02 NOTE — Assessment & Plan Note (Addendum)
#  Severe anemia hemoglobin-nadir 6 [summer 2020]. 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 JUNE 2023- NEGATIVE for hemolysis [haptoglobin-WNL]  # Today hemoglobin is  9.8- Proceed with Retacrit today.- MARCH 2024-  I sat-27 ferritin-377[name]- monthly.   # B12 def- FEB 2023- 232; continue B12 injections every monthly. [march 2023] ; B12 levels - 723 [NOV 2023] Proceed with injection today.  Stable.   # CKD stage III- Recommend continued p.o. intake.  Stable.   # TIA: on plavix [Dr.Shah]- stable.   Monthly B12; Ferra/retacrit q m  # DISPOSITION:  # HOLD Ferrahem today;  # proceed with RETACRIT;  B 12 injection # in 4 weeks- labs- H&H; Ferrahem; NO retacrit; b12 injefction # in 8 weeks-  MD-  labs- -cbc;bmp; B12 levels-  possible Ferrahem or possible retacrit; B12 injection- Dr.B

## 2023-09-02 NOTE — Progress Notes (Signed)
[facility] OFFICE PROGRESS NOTE  Patient Care Team: Dale [place], MD as PCP - General (Internal Medicine) Dale Wickliffe, MD (Internal Medicine) Marcina Millard, MD (Internal Medicine) Willeen Niece, MD (Specialist) Schnier, Latina Craver, MD (Vascular Surgery) Marcina Millard, MD (Internal Medicine) Willeen Niece, MD (Specialist) Schnier, Latina Craver, MD (Vascular Surgery) Earna Coder, MD as Consulting Physician (Oncology)   SUMMARY OF HEMATOLOGIC HISTORY:  # IRON DEFICIENCY ANEMIA- recurrent [? GI blood loss; Dr.Elliot; AVM-capsule]; IV Ferrahem q 48m; last colo- 2012/march; EGD- [id number].  July 2020 bone marrow biopsy-mild dysplastic changes; 40% hypercellularity; cytogenetics-normal.-Retacrit.MARCH 2023- Foundation One Hem- TP53 subclonal. B12 def- FEB 2023- Start B12 injections Monthly; MAY 3rd, 2023- Start Retacrit 20K [place].   #Colonoscopy-April 2020 aborted because of cardiac pause.  # CKD stage III-IV; CAD-Dr. Darrold Junker;  March 30th, 2022-TIA/slurred spleech [Dr.Shah]  INTERVAL HISTORY: In a wheelchair.  With her daughter.   89 year old female patient with above history of recurrent iron deficiency anemia unclear etiology/?-related to CKD-on Feraheme/Retacrit is here for follow-up.   Denies dizziness or fatigue. No visible bleeding. Patient denies any nausea vomiting. Denies any dyspnea or cough.   Review of Systems  Constitutional:  Positive for malaise/fatigue. Negative for chills, diaphoresis, fever and weight loss.  HENT:  Negative for nosebleeds and sore throat.   Eyes:  Negative for double vision.  Respiratory:  Negative for cough, hemoptysis, sputum production and wheezing.   Cardiovascular:  Negative for palpitations, orthopnea and leg swelling.  Gastrointestinal:  Negative for abdominal pain, blood in stool, constipation, diarrhea, heartburn, melena, nausea and vomiting.  Genitourinary:  Negative for dysuria, frequency and  urgency.  Musculoskeletal:  Negative for back pain and joint pain.  Skin: Negative.  Negative for itching and rash.  Neurological:  Negative for dizziness, tingling, focal weakness, weakness and headaches.  Endo/Heme/Allergies:  Does not bruise/bleed easily.  Psychiatric/Behavioral:  Negative for depression. The patient is not nervous/anxious and does not have insomnia.      PAST MEDICAL HISTORY :  Past Medical History:  Diagnosis Date   Arthritis    AV malformation of gastrointestinal tract    Basal cell carcinoma 08/07/2023   Central upper forehead. Nodular pattern. Mohs pending.   Blood in stool    CAD (coronary artery disease)    Carotid arterial disease (HCC)    Cataracts, bilateral    Chronic blood loss anemia    Chronic cystitis    CKD (chronic kidney disease), stage III (HCC)    Complication of anesthesia    reaction to propofol - caused heart to pause   Depression    Diabetes mellitus (HCC)    GERD (gastroesophageal reflux disease)    Hyperlipidemia    Hypertension    IDA (iron deficiency anemia)    Stress incontinence    TIA (transient ischemic attack) 03/2021   No deficits   Wears dentures    full upper and lower    PAST SURGICAL HISTORY :   Past Surgical History:  Procedure Laterality Date   ABDOMINAL HYSTERECTOMY  11/19/1970   ovaries left in place   APPENDECTOMY  11/19/1990   BACK SURGERY  06/08/2010   L3, L4, L5   CARDIAC CATHETERIZATION  02/2001   CAROTID ARTERY ANGIOPLASTY  09/19/2002   CARPAL TUNNEL RELEASE  1991, 92   right then left    CATARACT EXTRACTION W/PHACO Left 02/13/2022   Procedure: CATARACT EXTRACTION PHACO AND INTRAOCULAR LENS PLACEMENT (IOC) LEFT DIABETIC 14.90 01:26.8;  Surgeon: Galen Manila, MD;  [facility];  Service: Ophthalmology;  Laterality: Left;  Diabetic   CATARACT EXTRACTION W/PHACO Right 03/06/2022   Procedure: CATARACT EXTRACTION PHACO AND INTRAOCULAR LENS PLACEMENT (IOC) RIGHT DIABETIC 9.66  00:53.7;  Surgeon: Galen Manila, MD;  [facility]: [facility];  Service: Ophthalmology;  Laterality: Right;  Diabetic   CHOLECYSTECTOMY  11/20/1991   COLONOSCOPY WITH PROPOFOL N/A 02/02/2019   Procedure: COLONOSCOPY WITH PROPOFOL;  Surgeon: Scot Jun, MD;  Location: [facility] END[facility]COPY;  Service: Endoscopy;  Laterality: N/A;   ESOPHAGOGASTRODUODENOSCOPY N/A 02/02/2019   Procedure: ESOPHAGOGASTRODUODENOSCOPY (EGD);  Surgeon: Scot Jun, MD;  Location: [facility] ENDOSCOPY;  Service: Endoscopy;  Laterality: N/A;   FOOT SURGERY  06/20/2007   Left   right carotid artery surgery  01/09/2013   Dr. Gilda Crease @ [facility]   TOTAL HIP ARTHROPLASTY  05/03/2011   left 12, right 11/07    FAMILY HISTORY :   Family History  Problem Relation Age of Onset   Hyperlipidemia Father    Heart disease Father        myocardial infarction   Hypertension Father        Parent   Arthritis Other        parent   Diabetes Other        nephew   Cervical cancer Sister    Rectal cancer Sister    Breast cancer Neg Hx     SOCIAL HISTORY:   Social History   Tobacco Use   Smoking status: Former    Current packs/day: 0.00    Types: Cigarettes    Quit date: 11/19/1989    Years since quitting: 33.8   Smokeless tobacco: Never  Vaping Use   Vaping status: Never Used  Substance Use Topics   Alcohol use: No    Alcohol/week: 0.0 standard drinks of alcohol   Drug use: No    ALLERGIES:  is allergic to propofol, ciprofloxacin, levaquin [levofloxacin], and tequin [gatifloxacin].  MEDICATIONS:  Current Outpatient Medications  Medication Sig Dispense Refill   aspirin 81 MG tablet Take 81 mg by mouth daily.     Biotin 5000 MCG CAPS Take 1 capsule by mouth daily.     cetirizine (ZYRTEC) 10 MG tablet Take 10 mg by mouth daily.     docusate sodium (COLACE) 100 MG capsule Take 100 mg by mouth daily as needed for mild constipation.     ferrous sulfate 325 (65 FE) MG EC tablet Take 325 mg by mouth every other  day. Every other day     hydrocortisone 2.5 % cream apply topically 2 times daily as needed for itch at right lower leg 30 g 5   Lancet Device MISC Use as directed to check blood sugars 1 each 0   Lancets (ONETOUCH DELICA PLUS LANCET30G) MISC USE AS INSTRUCTED TO CHECK BLOOD SUGARS ONCE DAILY. DX E 11.9 100 each 12   Omega-3 Fatty Acids (FISH OIL) 1200 MG CAPS Take 1 capsule by mouth daily.     Probiotic, Lactobacillus, CAPS Take 1 capsule by mouth daily.     rosuvastatin (CRESTOR) 40 MG tablet TAKE 1 TABLET BY MOUTH EVERY DAY 90 tablet 1   traZODone (DESYREL) 50 MG tablet TAKE 1/2 TO 1 TABLET BY MOUTH AT BEDTIME AS NEEDED FOR SLEEP 90 tablet 1   triamcinolone cream (KENALOG) 0.1 % Apply 1 Application topically 2 (two) times daily. On affected area on leg.  Use for 7-10 days only - then stop. 30 g 0   guaiFENesin-dextromethorphan (ROBITUSSIN DM) 100-10 MG/5ML  syrup Take 5 mLs by mouth every 4 (four) hours as needed for cough. (Patient not taking: Reported on 07/08/2023) 118 mL 0   No current facility-administered medications for this visit.    PHYSICAL EXAMINATION:   BP 109/61 (BP Location: Left Arm, Patient Position: Sitting, Cuff Size: Normal)   Pulse 81   Temp 97.6 F (36.4 C) (Tympanic)   Ht 4\' 11"  (1.499 m)   Wt 138 lb (62.6 kg)   SpO2 97%   BMI 27.87 kg/m   Filed Weights   09/02/23 1422  Weight: 138 lb (62.6 kg)     Physical Exam Constitutional:      Comments: [place] Caucasian female patient.  She is in a wheelchair.  HENT:     Head: Normocephalic and atraumatic.     Mouth/Throat:     Pharynx: No oropharyngeal exudate.  Eyes:     Pupils: Pupils are equal, round, and reactive to light.     Comments: Positive for pallor.  Cardiovascular:     Rate and Rhythm: Normal rate and regular rhythm.     Heart sounds: Murmur heard.  Pulmonary:     Effort: No respiratory distress.     Breath sounds: No wheezing.  Abdominal:     General: Bowel sounds are normal. There  is no distension.     Palpations: Abdomen is soft. There is no mass.     Tenderness: There is no abdominal tenderness. There is no guarding or rebound.  Musculoskeletal:        General: No tenderness. Normal range of motion.     Cervical back: Normal range of motion and neck supple.  Skin:    General: Skin is warm.  Neurological:     Mental Status: She is alert and oriented to person, place, and time.  Psychiatric:        Mood and Affect: Affect normal.     LABORATORY DATA:  I have reviewed the data as listed    Component Value Date/Time   NA 136 09/02/2023 1415   NA 143 01/10/2013 0314   K 3.8 09/02/2023 1415   K 4.0 01/10/2013 0314   CL 105 09/02/2023 1415   CL 109 (H) 01/10/2013 0314   CO2 26 09/02/2023 1415   CO2 27 01/10/2013 0314   GLUCOSE 186 (H) 09/02/2023 1415   GLUCOSE 119 (H) 01/10/2013 0314   BUN 22 09/02/2023 1415   BUN 15 01/10/2013 0314   CREATININE 1.20 (H) 09/02/2023 1415   CREATININE 1.11 (H) 07/08/2023 1240   CREATININE 1.03 01/10/2013 0314   CREATININE 1.13 (H) 09/18/2012 0848   CALCIUM 8.5 (L) 09/02/2023 1415   CALCIUM 8.1 (L) 01/10/2013 0314   PROT 6.3 (L) 07/08/2023 1240   PROT 7.2 12/20/2011 1521   ALBUMIN 3.4 (L) 07/08/2023 1240   ALBUMIN 3.9 12/20/2011 1521   AST 19 07/08/2023 1240   ALT 15 07/08/2023 1240   ALT 37 12/20/2011 1521   ALKPHOS 43 07/08/2023 1240   ALKPHOS 57 12/20/2011 1521   BILITOT 0.5 07/08/2023 1240   GFRNONAA 43 (L) 09/02/2023 1415   GFRNONAA 48 (L) 07/08/2023 1240   GFRNONAA 52 (L) 01/10/2013 0314   GFRNONAA 47 (L) 09/18/2012 0848   GFRAA 46 (L) 07/12/2020 1240   GFRAA >60 01/10/2013 0314   GFRAA 54 (L) 09/18/2012 0848    No results found for: "SPEP", "UPEP"  Lab Results  Component Value Date   WBC 5.7 09/02/2023   NEUTROABS 4.0 09/02/2023   HGB 9.8 (L)  09/02/2023   HCT 31.2 (L) 09/02/2023   MCV 105.4 (H) 09/02/2023   PLT 167 09/02/2023      Chemistry      Component Value Date/Time   NA 136 09/02/2023  1415   NA 143 01/10/2013 0314   K 3.8 09/02/2023 1415   K 4.0 01/10/2013 0314   CL 105 09/02/2023 1415   CL 109 (H) 01/10/2013 0314   CO2 26 09/02/2023 1415   CO2 27 01/10/2013 0314   BUN 22 09/02/2023 1415   BUN 15 01/10/2013 0314   CREATININE 1.20 (H) 09/02/2023 1415   CREATININE 1.11 (H) 07/08/2023 1240   CREATININE 1.03 01/10/2013 0314   CREATININE 1.13 (H) 09/18/2012 0848      Component Value Date/Time   CALCIUM 8.5 (L) 09/02/2023 1415   CALCIUM 8.1 (L) 01/10/2013 0314   ALKPHOS 43 07/08/2023 1240   ALKPHOS 57 12/20/2011 1521   AST 19 07/08/2023 1240   ALT 15 07/08/2023 1240   ALT 37 12/20/2011 1521   BILITOT 0.5 07/08/2023 1240      ASSESSMENT & PLAN:   Anemia of chronic kidney failure, stage 3 (moderate) (HCC) #Severe anemia hemoglobin-nadir 6 [summer 2020]. 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 JUNE 2023- NEGATIVE for hemolysis [haptoglobin-WNL]  # Today hemoglobin is  9.8- Proceed with Retacrit today.- MARCH 2024-  I sat-27 ferritin-377[name]- monthly.   # B12 def- FEB 2023- 232; continue B12 injections every monthly. [march 2023] ; B12 levels - 723 [NOV 2023] Proceed with injection today.  Stable.   # CKD stage III- Recommend continued p.o. intake.  Stable.   # TIA: on plavix [Dr.Shah]- stable.   Monthly B12; Ferra/retacrit q m  # DISPOSITION:  # HOLD Ferrahem today;  # proceed with RETACRIT;  B 12 injection # in 4 weeks- labs- H&H; Ferrahem; NO retacrit; b12 injefction # in 8 weeks-  MD-  labs- -cbc;bmp; B12 levels-  possible Ferrahem or possible retacrit; B12 injection- Dr.B    Earna Coder, MD 09/02/2023 3:21 PM

## 2023-09-02 NOTE — Progress Notes (Signed)
Fatigue/weakness: no Dyspena: yes Light headedness: no Blood in stool: no  Pt states he had mammogram 2 weeks ago.

## 2023-09-03 LAB — HAPTOGLOBIN: Haptoglobin: 149 mg/dL (ref 41–333)

## 2023-09-25 ENCOUNTER — Other Ambulatory Visit: Payer: Self-pay | Admitting: Dermatology

## 2023-09-25 DIAGNOSIS — L219 Seborrheic dermatitis, unspecified: Secondary | ICD-10-CM

## 2023-10-01 ENCOUNTER — Inpatient Hospital Stay: Payer: Medicare Other

## 2023-10-01 ENCOUNTER — Ambulatory Visit: Payer: Medicare Other | Admitting: Nurse Practitioner

## 2023-10-02 ENCOUNTER — Encounter: Payer: Self-pay | Admitting: Internal Medicine

## 2023-10-02 ENCOUNTER — Inpatient Hospital Stay: Payer: Medicare Other

## 2023-10-02 ENCOUNTER — Inpatient Hospital Stay: Payer: Medicare Other | Attending: Internal Medicine

## 2023-10-02 ENCOUNTER — Inpatient Hospital Stay (HOSPITAL_BASED_OUTPATIENT_CLINIC_OR_DEPARTMENT_OTHER): Payer: Medicare Other | Admitting: Internal Medicine

## 2023-10-02 VITALS — BP 101/67 | HR 87 | Temp 97.7°F | Ht 59.0 in | Wt 141.2 lb

## 2023-10-02 DIAGNOSIS — Z7902 Long term (current) use of antithrombotics/antiplatelets: Secondary | ICD-10-CM | POA: Insufficient documentation

## 2023-10-02 DIAGNOSIS — D631 Anemia in chronic kidney disease: Secondary | ICD-10-CM

## 2023-10-02 DIAGNOSIS — D649 Anemia, unspecified: Secondary | ICD-10-CM | POA: Diagnosis not present

## 2023-10-02 DIAGNOSIS — Z79899 Other long term (current) drug therapy: Secondary | ICD-10-CM | POA: Insufficient documentation

## 2023-10-02 DIAGNOSIS — E538 Deficiency of other specified B group vitamins: Secondary | ICD-10-CM | POA: Insufficient documentation

## 2023-10-02 DIAGNOSIS — N183 Chronic kidney disease, stage 3 unspecified: Secondary | ICD-10-CM

## 2023-10-02 DIAGNOSIS — Z8673 Personal history of transient ischemic attack (TIA), and cerebral infarction without residual deficits: Secondary | ICD-10-CM | POA: Insufficient documentation

## 2023-10-02 LAB — HEMOGLOBIN AND HEMATOCRIT (CANCER CENTER ONLY)
HCT: 36.5 % (ref 36.0–46.0)
Hemoglobin: 11.6 g/dL — ABNORMAL LOW (ref 12.0–15.0)

## 2023-10-02 MED ORDER — CYANOCOBALAMIN 1000 MCG/ML IJ SOLN
1000.0000 ug | Freq: Once | INTRAMUSCULAR | Status: AC
  Administered 2023-10-02: 1000 ug via INTRAMUSCULAR

## 2023-10-02 NOTE — Patient Instructions (Signed)
 Vitamin B12 Injection What is this medication? Vitamin B12 (VAHY tuh min B12) prevents and treats low vitamin B12 levels in your body. It is used in people who do not get enough vitamin B12 from their diet or when their digestive tract does not absorb enough. Vitamin B12 plays an important role in maintaining the health of your nervous system and red blood cells. This medicine may be used for other purposes; ask your health care provider or pharmacist if you have questions. COMMON BRAND NAME(S): B-12 Compliance Kit, B-12 Injection Kit, Cyomin, Dodex, LA-12, Nutri-Twelve, Physicians EZ Use B-12, Primabalt, Vitamin Deficiency Injectable System - B12 What should I tell my care team before I take this medication? They need to know if you have any of these conditions: Kidney disease Leber's disease Megaloblastic anemia An unusual or allergic reaction to cyanocobalamin, cobalt, other medications, foods, dyes, or preservatives Pregnant or trying to get pregnant Breast-feeding How should I use this medication? This medication is injected into a muscle or deeply under the skin. It is usually given in a clinic or care team's office. However, your care team may teach you how to inject yourself. Follow all instructions. Talk to your care team about the use of this medication in children. Special care may be needed. Overdosage: If you think you have taken too much of this medicine contact a poison control center or emergency room at once. NOTE: This medicine is only for you. Do not share this medicine with others. What if I miss a dose? If you are given your dose at a clinic or care team's office, call to reschedule your appointment. If you give your own injections, and you miss a dose, take it as soon as you can. If it is almost time for your next dose, take only that dose. Do not take double or extra doses. What may interact with this medication? Alcohol Colchicine This list may not describe all possible  interactions. Give your health care provider a list of all the medicines, herbs, non-prescription drugs, or dietary supplements you use. Also tell them if you smoke, drink alcohol, or use illegal drugs. Some items may interact with your medicine. What should I watch for while using this medication? Visit your care team regularly. You may need blood work done while you are taking this medication. You may need to follow a special diet. Talk to your care team. Limit your alcohol intake and avoid smoking to get the best benefit. What side effects may I notice from receiving this medication? Side effects that you should report to your care team as soon as possible: Allergic reactions--skin rash, itching, hives, swelling of the face, lips, tongue, or throat Swelling of the ankles, hands, or feet Trouble breathing Side effects that usually do not require medical attention (report to your care team if they continue or are bothersome): Diarrhea This list may not describe all possible side effects. Call your doctor for medical advice about side effects. You may report side effects to FDA at 1-800-FDA-1088. Where should I keep my medication? Keep out of the reach of children. Store at room temperature between 15 and 30 degrees C (59 and 85 degrees F). Protect from light. Throw away any unused medication after the expiration date. NOTE: This sheet is a summary. It may not cover all possible information. If you have questions about this medicine, talk to your doctor, pharmacist, or health care provider.  2024 Elsevier/Gold Standard (2021-07-18 00:00:00)

## 2023-10-02 NOTE — Progress Notes (Unsigned)
Fatigue/weakness: no Dyspena: no Light headedness: no Blood in stool: no  Skin cancer, BCC, removed from forehead 09/09/23.  Mammogram since last visit 08/21/23.  Vein and vascular 08/26/23.

## 2023-10-02 NOTE — Progress Notes (Unsigned)
[facility] OFFICE PROGRESS NOTE  Patient Care Team: Dale [place], MD as PCP - General (Internal Medicine) Dale Allen, MD (Internal Medicine) Marcina Millard, MD (Internal Medicine) Willeen Niece, MD (Specialist) Schnier, Latina Craver, MD (Vascular Surgery) Marcina Millard, MD (Internal Medicine) Willeen Niece, MD (Specialist) Schnier, Latina Craver, MD (Vascular Surgery) Earna Coder, MD as Consulting Physician (Oncology)   SUMMARY OF HEMATOLOGIC HISTORY:  # IRON DEFICIENCY ANEMIA- recurrent [? GI blood loss; Dr.Elliot; AVM-capsule]; IV Ferrahem q 40m; last colo- 2012/march; EGD- [id number].  July 2020 bone marrow biopsy-mild dysplastic changes; 40% hypercellularity; cytogenetics-normal.-Retacrit.MARCH 2023- [facility] One Hem- TP53 subclonal. B12 def- FEB 2023- Start B12 injections Monthly; MAY 3rd, 2023- Start Retacrit 20K [place].   #Colonoscopy-April 2020 aborted because of cardiac pause.  # CKD stage III-IV; CAD-Dr. Darrold Junker;  March 30th, 2022-TIA/slurred spleech [Dr.Shah]  INTERVAL HISTORY: In a wheelchair.  With her daughter.   89 year old female patient with above history of recurrent iron deficiency anemia unclear etiology/?-related to CKD-on Feraheme/Retacrit is here for follow-up.  Patient continues to have mild fatigue ; not any worse.    Skin cancer, BCC, removed from forehead 09/09/23; Mammogram since last visit 08/21/23.   Denies dizziness or fatigue. No visible bleeding. Patient denies any nausea vomiting. Denies any dyspnea or cough.   Review of Systems  Constitutional:  Positive for malaise/fatigue. Negative for chills, diaphoresis, fever and weight loss.  HENT:  Negative for nosebleeds and sore throat.   Eyes:  Negative for double vision.  Respiratory:  Negative for cough, hemoptysis, sputum production and wheezing.   Cardiovascular:  Negative for palpitations, orthopnea and leg swelling.  Gastrointestinal:  Negative for  abdominal pain, blood in stool, constipation, diarrhea, heartburn, melena, nausea and vomiting.  Genitourinary:  Negative for dysuria, frequency and urgency.  Musculoskeletal:  Negative for back pain and joint pain.  Skin: Negative.  Negative for itching and rash.  Neurological:  Negative for dizziness, tingling, focal weakness, weakness and headaches.  Endo/Heme/Allergies:  Does not bruise/bleed easily.  Psychiatric/Behavioral:  Negative for depression. The patient is not nervous/anxious and does not have insomnia.      PAST MEDICAL HISTORY :  Past Medical History:  Diagnosis Date   Arthritis    AV malformation of gastrointestinal tract    Basal cell carcinoma 08/07/2023   Central upper forehead. Nodular pattern. Mohs pending.   Blood in stool    CAD (coronary artery disease)    Carotid arterial disease (HCC)    Cataracts, bilateral    Chronic blood loss anemia    Chronic cystitis    CKD (chronic kidney disease), stage III (HCC)    Complication of anesthesia    reaction to propofol - caused heart to pause   Depression    Diabetes mellitus (HCC)    GERD (gastroesophageal reflux disease)    Hyperlipidemia    Hypertension    IDA (iron deficiency anemia)    Stress incontinence    TIA (transient ischemic attack) 03/2021   No deficits   Wears dentures    full upper and lower    PAST SURGICAL HISTORY :   Past Surgical History:  Procedure Laterality Date   ABDOMINAL HYSTERECTOMY  11/19/1970   ovaries left in place   APPENDECTOMY  11/19/1990   BACK SURGERY  06/08/2010   L3, L4, L5   CARDIAC CATHETERIZATION  02/2001   CAROTID ARTERY ANGIOPLASTY  09/19/2002   CARPAL TUNNEL RELEASE  1991, 92   right then left    CATARACT EXTRACTION  W/PHACO Left 02/13/2022   Procedure: CATARACT EXTRACTION PHACO AND INTRAOCULAR LENS PLACEMENT (IOC) LEFT DIABETIC 14.90 01:26.8;  Surgeon: Galen Manila, MD;  Location: [facility];  Service: Ophthalmology;  Laterality: Left;  Diabetic    CATARACT EXTRACTION W/PHACO Right 03/06/2022   Procedure: CATARACT EXTRACTION PHACO AND INTRAOCULAR LENS PLACEMENT (IOC) RIGHT DIABETIC 9.66 00:53.7;  Surgeon: Galen Manila, MD;  Location: [facility];  Service: Ophthalmology;  Laterality: Right;  Diabetic   CHOLECYSTECTOMY  11/20/1991   COLONOSCOPY WITH PROPOFOL N/A 02/02/2019   Procedure: COLONOSCOPY WITH PROPOFOL;  Surgeon: Scot Jun, MD;  Location: [facility] END[facility]COPY;  Service: Endoscopy;  Laterality: N/A;   ESOPHAGOGASTRODUODENOSCOPY N/A 02/02/2019   Procedure: ESOPHAGOGASTRODUODENOSCOPY (EGD);  Surgeon: Scot Jun, MD;  Location: [facility];  Service: Endoscopy;  Laterality: N/A;   FOOT SURGERY  06/20/2007   Left   right carotid artery surgery  01/09/2013   Dr. Gilda Crease @ [facility]   TOTAL HIP ARTHROPLASTY  05/03/2011   left 12, right 11/07    FAMILY HISTORY :   Family History  Problem Relation Age of Onset   Hyperlipidemia Father    Heart disease Father        myocardial infarction   Hypertension Father        Parent   Arthritis Other        parent   Diabetes Other        nephew   Cervical cancer Sister    Rectal cancer Sister    Breast cancer Neg Hx     SOCIAL HISTORY:   Social History   Tobacco Use   Smoking status: Former    Current packs/day: 0.00    Types: Cigarettes    Quit date: 11/19/1989    Years since quitting: 33.8   Smokeless tobacco: Never  Vaping Use   Vaping status: Never Used  Substance Use Topics   Alcohol use: No    Alcohol/week: 0.0 standard drinks of alcohol   Drug use: No    ALLERGIES:  is allergic to propofol, ciprofloxacin, levaquin [levofloxacin], and tequin [gatifloxacin].  MEDICATIONS:  Current Outpatient Medications  Medication Sig Dispense Refill   aspirin 81 MG tablet Take 81 mg by mouth daily.     Biotin 5000 MCG CAPS Take 1 capsule by mouth daily.     cetirizine (ZYRTEC) 10 MG tablet Take 10 mg by mouth daily.     docusate sodium (COLACE) 100 MG  capsule Take 100 mg by mouth daily as needed for mild constipation.     ferrous sulfate 325 (65 FE) MG EC tablet Take 325 mg by mouth every other day. Every other day     hydrocortisone 2.5 % cream apply topically 2 times daily as needed for itch at right lower leg 30 g 5   ketoconazole (NIZORAL) 2 % shampoo MASSAGE INTO SCALP AND LET SIT SEVERAL MINUTES BEFORE RINSING. USE 2-3 TIMES A WEEK. 120 mL 2   Lancet Device MISC Use as directed to check blood sugars 1 each 0   Lancets (ONETOUCH DELICA PLUS LANCET30G) MISC USE AS INSTRUCTED TO CHECK BLOOD SUGARS ONCE DAILY. DX E 11.9 100 each 12   Omega-3 Fatty Acids (FISH OIL) 1200 MG CAPS Take 1 capsule by mouth daily.     Probiotic, Lactobacillus, CAPS Take 1 capsule by mouth daily.     rosuvastatin (CRESTOR) 40 MG tablet TAKE 1 TABLET BY MOUTH EVERY DAY 90 tablet 1   traZODone (DESYREL) 50 MG tablet TAKE 1/2 TO 1  TABLET BY MOUTH AT BEDTIME AS NEEDED FOR SLEEP 90 tablet 1   triamcinolone cream (KENALOG) 0.1 % Apply 1 Application topically 2 (two) times daily. On affected area on leg.  Use for 7-10 days only - then stop. 30 g 0   guaiFENesin-dextromethorphan (ROBITUSSIN DM) 100-10 MG/5ML syrup Take 5 mLs by mouth every 4 (four) hours as needed for cough. (Patient not taking: Reported on 07/08/2023) 118 mL 0   No current facility-administered medications for this visit.    PHYSICAL EXAMINATION:   BP 101/67 (BP Location: Left Arm, Patient Position: Sitting, Cuff Size: Normal)   Pulse 87   Temp 97.7 F (36.5 C) (Tympanic)   Ht 4\' 11"  (1.499 m)   Wt 141 lb 3.2 oz (64 kg)   SpO2 97%   BMI 28.52 kg/m   Filed Weights   10/02/23 1518  Weight: 141 lb 3.2 oz (64 kg)     Physical Exam Constitutional:      Comments: [place] Caucasian female patient.  She is in a wheelchair.  HENT:     Head: Normocephalic and atraumatic.     Mouth/Throat:     Pharynx: No oropharyngeal exudate.  Eyes:     Pupils: Pupils are equal, round, and reactive to  light.     Comments: Positive for pallor.  Cardiovascular:     Rate and Rhythm: Normal rate and regular rhythm.     Heart sounds: Murmur heard.  Pulmonary:     Effort: No respiratory distress.     Breath sounds: No wheezing.  Abdominal:     General: Bowel sounds are normal. There is no distension.     Palpations: Abdomen is soft. There is no mass.     Tenderness: There is no abdominal tenderness. There is no guarding or rebound.  Musculoskeletal:        General: No tenderness. Normal range of motion.     Cervical back: Normal range of motion and neck supple.  Skin:    General: Skin is warm.  Neurological:     Mental Status: She is alert and oriented to person, place, and time.  Psychiatric:        Mood and Affect: Affect normal.     LABORATORY DATA:  I have reviewed the data as listed    Component Value Date/Time   NA 136 09/02/2023 1415   NA 143 01/10/2013 0314   K 3.8 09/02/2023 1415   K 4.0 01/10/2013 0314   CL 105 09/02/2023 1415   CL 109 (H) 01/10/2013 0314   CO2 26 09/02/2023 1415   CO2 27 01/10/2013 0314   GLUCOSE 186 (H) 09/02/2023 1415   GLUCOSE 119 (H) 01/10/2013 0314   BUN 22 09/02/2023 1415   BUN 15 01/10/2013 0314   CREATININE 1.20 (H) 09/02/2023 1415   CREATININE 1.11 (H) 07/08/2023 1240   CREATININE 1.03 01/10/2013 0314   CREATININE 1.13 (H) 09/18/2012 0848   CALCIUM 8.5 (L) 09/02/2023 1415   CALCIUM 8.1 (L) 01/10/2013 0314   PROT 6.3 (L) 07/08/2023 1240   PROT 7.2 12/20/2011 1521   ALBUMIN 3.4 (L) 07/08/2023 1240   ALBUMIN 3.9 12/20/2011 1521   AST 19 07/08/2023 1240   ALT 15 07/08/2023 1240   ALT 37 12/20/2011 1521   ALKPHOS 43 07/08/2023 1240   ALKPHOS 57 12/20/2011 1521   BILITOT 0.5 07/08/2023 1240   GFRNONAA 43 (L) 09/02/2023 1415   GFRNONAA 48 (L) 07/08/2023 1240   GFRNONAA 52 (L) 01/10/2013 0314   GFRNONAA  47 (L) 09/18/2012 0848   GFRAA 46 (L) 07/12/2020 1240   GFRAA >60 01/10/2013 0314   GFRAA 54 (L) 09/18/2012 0848    No  results found for: "SPEP", "UPEP"  Lab Results  Component Value Date   WBC 5.7 09/02/2023   NEUTROABS 4.0 09/02/2023   HGB 11.6 (L) 10/02/2023   HCT 36.5 10/02/2023   MCV 105.4 (H) 09/02/2023   PLT 167 09/02/2023      Chemistry      Component Value Date/Time   NA 136 09/02/2023 1415   NA 143 01/10/2013 0314   K 3.8 09/02/2023 1415   K 4.0 01/10/2013 0314   CL 105 09/02/2023 1415   CL 109 (H) 01/10/2013 0314   CO2 26 09/02/2023 1415   CO2 27 01/10/2013 0314   BUN 22 09/02/2023 1415   BUN 15 01/10/2013 0314   CREATININE 1.20 (H) 09/02/2023 1415   CREATININE 1.11 (H) 07/08/2023 1240   CREATININE 1.03 01/10/2013 0314   CREATININE 1.13 (H) 09/18/2012 0848      Component Value Date/Time   CALCIUM 8.5 (L) 09/02/2023 1415   CALCIUM 8.1 (L) 01/10/2013 0314   ALKPHOS 43 07/08/2023 1240   ALKPHOS 57 12/20/2011 1521   AST 19 07/08/2023 1240   ALT 15 07/08/2023 1240   ALT 37 12/20/2011 1521   BILITOT 0.5 07/08/2023 1240      ASSESSMENT & PLAN:   Anemia of chronic kidney failure, stage 3 (moderate) (HCC) #Severe anemia hemoglobin-nadir 6 [summer 2020]. 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 JUNE 2023- NEGATIVE for hemolysis [haptoglobin-WNL]  # Today hemoglobin is  11.6-  Proceed with Retacrit today.- MARCH 2024-  I sat-27 ferritin-377[name]- monthly.   # B12 def- continue B12 injections every monthly. [march 2023] ; B12 levels - 723 [NOV 2023] Proceed with injection today.  Stable.   # CKD stage III- Recommend continued p.o. intake.  Stable.   # TIA: on plavix [Dr.Shah]- stable.   Monthly B12; Ferra/retacrit q m  # DISPOSITION:  # HOLD Ferrahem today;  # HOLD RETACRIT;   # Ok with B 12 injection injection   # in 4 weeks- retacrit; B12 ijection # in 8 weeks-  MD-  labs- -cbc;bmp; Iron studies; ferritn;  B12 levels- possible Ferrahem or possible retacrit; B12  injection- Dr.B     Earna Coder, MD 10/02/2023 4:10 PM

## 2023-10-02 NOTE — Assessment & Plan Note (Addendum)
#  Severe anemia hemoglobin-nadir 6 [summer 2020]. 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 JUNE 2023- NEGATIVE for hemolysis [haptoglobin-WNL]  # Today hemoglobin is  11.6-  Proceed with Retacrit today.- MARCH 2024-  I sat-27 ferritin-377[name]- monthly.   # B12 def- continue B12 injections every monthly. [march 2023] ; B12 levels - 723 [NOV 2023] Proceed with injection today.  Stable.   # CKD stage III- Recommend continued p.o. intake.  Stable.   # TIA: on plavix [Dr.Shah]- stable.   Monthly B12; Ferra/retacrit q m  # DISPOSITION:  # HOLD Ferrahem today;  # HOLD RETACRIT;   # Ok with B 12 injection injection   # in 4 weeks- retacrit; B12 ijection # in 8 weeks-  MD-  labs- -cbc;bmp; Iron studies; ferritn;  B12 levels- possible Ferrahem or possible retacrit; B12 injection- Dr.B

## 2023-10-03 ENCOUNTER — Encounter: Payer: Self-pay | Admitting: Internal Medicine

## 2023-10-04 ENCOUNTER — Other Ambulatory Visit: Payer: Self-pay | Admitting: Internal Medicine

## 2023-10-07 ENCOUNTER — Encounter: Payer: Self-pay | Admitting: Internal Medicine

## 2023-10-07 ENCOUNTER — Ambulatory Visit: Payer: Medicare Other | Admitting: Internal Medicine

## 2023-10-07 VITALS — BP 120/68 | HR 80 | Temp 97.6°F | Ht 59.0 in | Wt 140.0 lb

## 2023-10-07 DIAGNOSIS — I4891 Unspecified atrial fibrillation: Secondary | ICD-10-CM

## 2023-10-07 DIAGNOSIS — M25552 Pain in left hip: Secondary | ICD-10-CM

## 2023-10-07 DIAGNOSIS — I10 Essential (primary) hypertension: Secondary | ICD-10-CM

## 2023-10-07 DIAGNOSIS — E7800 Pure hypercholesterolemia, unspecified: Secondary | ICD-10-CM

## 2023-10-07 DIAGNOSIS — D649 Anemia, unspecified: Secondary | ICD-10-CM

## 2023-10-07 DIAGNOSIS — E1159 Type 2 diabetes mellitus with other circulatory complications: Secondary | ICD-10-CM

## 2023-10-07 DIAGNOSIS — I779 Disorder of arteries and arterioles, unspecified: Secondary | ICD-10-CM

## 2023-10-07 DIAGNOSIS — D696 Thrombocytopenia, unspecified: Secondary | ICD-10-CM

## 2023-10-07 DIAGNOSIS — I700 Atherosclerosis of aorta: Secondary | ICD-10-CM

## 2023-10-07 DIAGNOSIS — N1832 Chronic kidney disease, stage 3b: Secondary | ICD-10-CM

## 2023-10-07 DIAGNOSIS — Z23 Encounter for immunization: Secondary | ICD-10-CM | POA: Diagnosis not present

## 2023-10-07 DIAGNOSIS — I2510 Atherosclerotic heart disease of native coronary artery without angina pectoris: Secondary | ICD-10-CM

## 2023-10-07 NOTE — Progress Notes (Signed)
Subjective:    Patient ID: Jocelyn Elliott, female    DOB: May 22, 1932, 89 y.o.   MRN: [id number]  Patient here for  Chief Complaint  Patient presents with   Medical Management of Chronic Issues    4 month f/up -blood pressure    HPI Here to follow up regarding diabetes, hypertension and anemia. Seeing Dr Donneta Romberg for her anemia.  Last hgb 11.6. evaluated by [facility] 08/26/23 - carotic duplex - patent bilateral CEA's with 1-39% RICA and 40-50% LICA restenosis.  No significant change compared to last study. F/u 12 months. Breathing stable. No increased cough or congestion. No abdominal pain reported. Having persistent left hip pain. Affecting activity.  Not as active. Discussed home health PT.  Agreeable.    Past Medical History:  Diagnosis Date   Arthritis    AV malformation of gastrointestinal tract    Basal cell carcinoma 08/07/2023   Central upper forehead. Nodular pattern. Mohs pending.   Blood in stool    CAD (coronary artery disease)    Carotid arterial disease (HCC)    Cataracts, bilateral    Chronic blood loss anemia    Chronic cystitis    CKD (chronic kidney disease), stage III (HCC)    Complication of anesthesia    reaction to propofol - caused heart to pause   Depression    Diabetes mellitus (HCC)    GERD (gastroesophageal reflux disease)    Hyperlipidemia    Hypertension    IDA (iron deficiency anemia)    Stress incontinence    TIA (transient ischemic attack) 03/2021   No deficits   Wears dentures    full upper and lower   Past Surgical History:  Procedure Laterality Date   ABDOMINAL HYSTERECTOMY  11/19/1970   ovaries left in place   APPENDECTOMY  11/19/1990   BACK SURGERY  06/08/2010   L3, L4, L5   CARDIAC CATHETERIZATION  02/2001   CAROTID ARTERY ANGIOPLASTY  09/19/2002   CARPAL TUNNEL RELEASE  1991, 92   right then left    CATARACT EXTRACTION W/PHACO Left 02/13/2022   Procedure: CATARACT EXTRACTION PHACO AND INTRAOCULAR LENS PLACEMENT (IOC) LEFT  DIABETIC 14.90 01:26.8;  Surgeon: Galen Manila, MD;  Location: [facility];  Service: Ophthalmology;  Laterality: Left;  Diabetic   CATARACT EXTRACTION W/PHACO Right 03/06/2022   Procedure: CATARACT EXTRACTION PHACO AND INTRAOCULAR LENS PLACEMENT (IOC) RIGHT DIABETIC 9.66 00:53.7;  Surgeon: Galen Manila, MD;  Location: [facility];  Service: Ophthalmology;  Laterality: Right;  Diabetic   CHOLECYSTECTOMY  11/20/1991   COLONOSCOPY WITH PROPOFOL N/A 02/02/2019   Procedure: COLONOSCOPY WITH PROPOFOL;  Surgeon: Scot Jun, MD;  Location[facility];  Service: Endoscopy;  Laterality: N/A;   ESOPHAGOGASTRODUODENOSCOPY N/A 02/02/2019   Procedure: ESOPHAGOGASTRODUODENOSCOPY (EGD);  Surgeon: Scot Jun, MD;  Location: [facility];  Service: Endoscopy;  Laterality: N/A;   FOOT SURGERY  06/20/2007   Left   right carotid artery surgery  01/09/2013   Dr. Gilda Crease @ [facility]   TOTAL HIP ARTHROPLASTY  05/03/2011   left 12, right 11/07   Family History  Problem Relation Age of Onset   Hyperlipidemia Father    Heart disease Father        myocardial infarction   Hypertension Father        Parent   Arthritis Other        parent   Diabetes Other        nephew   Cervical cancer Sister  Rectal cancer Sister    Breast cancer Neg Hx    Social History   Socioeconomic History   Marital status: Widowed    Spouse name: Not on file   Number of children: 1   Years of education: 83   Highest education level: Not on file  Occupational History   Occupation: Retired  Tobacco Use   Smoking status: Former    Current packs/day: 0.00    Types: Cigarettes    Quit date: 11/19/1989    Years since quitting: 33.9   Smokeless tobacco: Never  Vaping Use   Vaping status: Never Used  Substance and Sexual Activity   Alcohol use: No    Alcohol/week: 0.0 standard drinks of alcohol   Drug use: No   Sexual activity: Not Currently  Other Topics Concern   Not on file   Social History Narrative   Regular exercise-no   Caffeine Use-yes   Social Determinants of Health   Financial Resource Strain: Low Risk  (04/02/2023)   Overall Financial Resource Strain (CARDIA)    Difficulty of Paying Living Expenses: Not hard at all  Food Insecurity: No Food Insecurity (04/02/2023)   Hunger Vital Sign    Worried About Running Out of Food in the Last Year: Never true    Ran Out of Food in the Last Year: Never true  Transportation Needs: No Transportation Needs (04/02/2023)   PRAPARE - Administrator, Civil Service (Medical): No    Lack of Transportation (Non-Medical): No  Physical Activity: Insufficiently Active (04/02/2023)   Exercise Vital Sign    Days of Exercise per Week: 7 days    Minutes of Exercise per Session: 20 min  Stress: No Stress Concern Present (04/02/2023)   [employer] of Occupational Health - Occupational Stress Questionnaire    Feeling of Stress : Not at all  Social Connections: Socially Integrated (04/02/2023)   Social Connection and Isolation Panel [NHANES]    Frequency of Communication with Friends and Family: More than three times a week    Frequency of Social Gatherings with Friends and Family: More than three times a week    Attends Religious Services: More than 4 times per year    Active Member of [employer] or Organizations: Yes    Attends Engineer, structural: More than 4 times per year    Marital Status: Married     Review of Systems  Constitutional:  Negative for appetite change and unexpected weight change.  HENT:  Negative for congestion and sinus pressure.   Respiratory:  Negative for cough, chest tightness and shortness of breath.   Cardiovascular:  Negative for chest pain and palpitations.  Gastrointestinal:  Negative for abdominal pain, diarrhea, nausea and vomiting.  Genitourinary:  Negative for difficulty urinating and dysuria.  Musculoskeletal:  Negative for myalgias.       Left hip pain as outlined.    Skin:  Negative for color change and rash.  Neurological:  Negative for dizziness and headaches.  Psychiatric/Behavioral:  Negative for agitation and dysphoric mood.        Objective:     BP 120/68   Pulse 80   Temp 97.6 F (36.4 C) (Oral)   Ht 4\' 11"  (1.499 m)   Wt 140 lb (63.5 kg)   SpO2 96%   BMI 28.28 kg/m  Wt Readings from Last 3 Encounters:  10/07/23 140 lb (63.5 kg)  10/02/23 141 lb 3.2 oz (64 kg)  09/02/23 138 lb (62.6 kg)  Physical Exam Vitals reviewed.  Constitutional:      General: She is not in acute distress.    Appearance: Normal appearance.  HENT:     Head: Normocephalic and atraumatic.     Right Ear: External ear normal.     Left Ear: External ear normal.  Eyes:     General: No scleral icterus.       Right eye: No discharge.        Left eye: No discharge.     Conjunctiva/sclera: Conjunctivae normal.  Neck:     Thyroid: No thyromegaly.  Cardiovascular:     Rate and Rhythm: Normal rate and regular rhythm.  Pulmonary:     Effort: No respiratory distress.     Breath sounds: Normal breath sounds. No wheezing.  Abdominal:     General: Bowel sounds are normal.     Palpations: Abdomen is soft.     Tenderness: There is no abdominal tenderness.  Musculoskeletal:        General: No swelling or tenderness.     Cervical back: Neck supple. No tenderness.  Lymphadenopathy:     Cervical: No cervical adenopathy.  Skin:    Findings: No erythema or rash.  Neurological:     Mental Status: She is alert.  Psychiatric:        Mood and Affect: Mood normal.        Behavior: Behavior normal.      Outpatient Encounter Medications as of 10/07/2023  Medication Sig   aspirin 81 MG tablet Take 81 mg by mouth daily.   Biotin 5000 MCG CAPS Take 1 capsule by mouth daily.   cetirizine (ZYRTEC) 10 MG tablet Take 10 mg by mouth daily.   docusate sodium (COLACE) 100 MG capsule Take 100 mg by mouth daily as needed for mild constipation.   ferrous sulfate 325 (65  FE) MG EC tablet Take 325 mg by mouth every other day. Every other day   guaiFENesin-dextromethorphan (ROBITUSSIN DM) 100-10 MG/5ML syrup Take 5 mLs by mouth every 4 (four) hours as needed for cough.   hydrocortisone 2.5 % cream apply topically 2 times daily as needed for itch at right lower leg   ketoconazole (NIZORAL) 2 % shampoo MASSAGE INTO SCALP AND LET SIT SEVERAL MINUTES BEFORE RINSING. USE 2-3 TIMES A WEEK.   Lancet Device MISC Use as directed to check blood sugars   Lancets (ONETOUCH DELICA PLUS LANCET30G) MISC USE AS INSTRUCTED TO CHECK BLOOD SUGARS ONCE DAILY. DX E 11.9   Omega-3 Fatty Acids (FISH OIL) 1200 MG CAPS Take 1 capsule by mouth daily.   Probiotic, Lactobacillus, CAPS Take 1 capsule by mouth daily.   rosuvastatin (CRESTOR) 40 MG tablet TAKE 1 TABLET BY MOUTH EVERY DAY   traZODone (DESYREL) 50 MG tablet TAKE 1/2 TO 1 TABLET BY MOUTH AT BEDTIME AS NEEDED FOR SLEEP   triamcinolone cream (KENALOG) 0.1 % Apply 1 Application topically 2 (two) times daily. On affected area on leg.  Use for 7-10 days only - then stop.   No facility-administered encounter medications on file as of 10/07/2023.     Lab Results  Component Value Date   WBC 5.7 09/02/2023   HGB 11.6 (L) 10/02/2023   HCT 36.5 10/02/2023   PLT 167 09/02/2023   GLUCOSE 186 (H) 09/02/2023   CHOL 112 07/08/2023   TRIG 150 (H) 07/08/2023   HDL 52 07/08/2023   LDLDIRECT 51.0 04/14/2019   LDLCALC 30 07/08/2023   ALT 15 07/08/2023   AST  19 07/08/2023   NA 136 09/02/2023   K 3.8 09/02/2023   CL 105 09/02/2023   CREATININE 1.20 (H) 09/02/2023   BUN 22 09/02/2023   CO2 26 09/02/2023   TSH 1.401 07/08/2023   INR 1.2 02/28/2023   HGBA1C 4.9 07/08/2023   MICROALBUR 12.7 (H) 07/16/2022    MM 3D SCREENING MAMMOGRAM BILATERAL BREAST  Result Date: 08/23/2023 CLINICAL DATA:  Screening. EXAM: DIGITAL SCREENING BILATERAL MAMMOGRAM WITH TOMOSYNTHESIS AND CAD TECHNIQUE: Bilateral screening digital craniocaudal and  mediolateral oblique mammograms were obtained. Bilateral screening digital breast tomosynthesis was performed. The images were evaluated with computer-aided detection. COMPARISON:  Previous exam(s). ACR Breast Density Category c: The breasts are heterogeneously dense, which may obscure small masses. FINDINGS: There are no findings suspicious for malignancy. IMPRESSION: No mammographic evidence of malignancy. A result letter of this screening mammogram will be mailed directly to the patient. RECOMMENDATION: Screening mammogram in one year. (Code:SM-B-01Y) BI-RADS CATEGORY  1: Negative. Electronically Signed   By: Edwin Cap M.D.   On: 08/23/2023 07:47       Assessment & Plan:  Pain of left hip Assessment & Plan: Persistent left hip pain. Limiting activity. Discussed home health PT.  Agreeable.    Orders: -     Ambulatory referral to Home Health  Need for influenza vaccination -     Flu Vaccine Trivalent High Dose (Fluad)  Atrial fibrillation, unspecified type [facility]) Assessment & Plan: Continues on aspirin. Heart rate controlled. Appears to have regular rhythm on exam.    Anemia, unspecified type Assessment & Plan: Followed by hematology.  History of AVM.  Also with CKD.  Receiving IV iron.  Followed by Dr Donneta Romberg.  Last hgb 11.6.    Aortic atherosclerosis (HCC) Assessment & Plan: Continue crestor.      Coronary artery disease involving native coronary artery of native heart without angina pectoris Assessment & Plan: Off imdur due to low blood pressure.  Remains on crestor.  Currently without symptoms.    Carotid artery disease, unspecified laterality, unspecified type (HCC) Assessment & Plan:  Continue antiplatelet therapy as prescribed Continue management of CAD, HTN and Hyperlipidemia Evaluated [facility] 08/26/23 -ultrasound shows RICA 1-39% and LICA 40-59% stenosis bilaterally s/p left CEA.  Recommended f/u in 12 months.     Type 2 diabetes mellitus with other  circulatory complication, without long-term current use of insulin (HCC) Assessment & Plan: Sugars as outlined.  Watches diet.   Lab Results  Component Value Date   HGBA1C 4.9 07/08/2023      Disorder of artery or arteriole (HCC) Assessment & Plan: Has been evaluated and followed by [facility].  Continue risk factor modification.  Continue daily aspirin.    Hypercholesteremia Assessment & Plan: Continue crestor.  Low cholesterol diet and exercise.  Follow lipid panel and liver function tests.     Primary hypertension Assessment & Plan: Blood pressure as outlined. Follow pressures.  Denies dizziness or light headedness.    Stage 3b chronic kidney disease (HCC) Assessment & Plan: Continue to avoid antiinflammatories.  Stay hydrated.  Follow metabolic panel. Off ace inhibitor due to low blood pressure. Will have metabolic panel checked through hematology.    Thrombocytopenia (HCC) Assessment & Plan: Follow cbc.  Being followed by hematology.       Dale Strum, MD

## 2023-10-12 ENCOUNTER — Encounter: Payer: Self-pay | Admitting: Internal Medicine

## 2023-10-12 NOTE — Assessment & Plan Note (Signed)
Follow cbc.  Being followed by hematology.

## 2023-10-12 NOTE — Assessment & Plan Note (Signed)
Continues on aspirin. Heart rate controlled. Appears to have regular rhythm on exam.

## 2023-10-12 NOTE — Assessment & Plan Note (Signed)
Off imdur due to low blood pressure.  Remains on crestor.  Currently without symptoms.

## 2023-10-12 NOTE — Assessment & Plan Note (Signed)
Continue crestor.  Low cholesterol diet and exercise.  Follow lipid panel and liver function tests.  

## 2023-10-12 NOTE — Assessment & Plan Note (Signed)
Continue crestor 

## 2023-10-12 NOTE — Assessment & Plan Note (Signed)
Continue antiplatelet therapy as prescribed Continue management of CAD, HTN and Hyperlipidemia Evaluated [facility] 08/26/23 -ultrasound shows RICA 1-39% and LICA 40-59% stenosis bilaterally s/p left CEA.  Recommended f/u in 12 months.

## 2023-10-12 NOTE — Assessment & Plan Note (Signed)
Continue to avoid antiinflammatories.  Stay hydrated.  Follow metabolic panel. Off ace inhibitor due to low blood pressure. Will have metabolic panel checked through hematology.

## 2023-10-12 NOTE — Assessment & Plan Note (Signed)
Persistent left hip pain. Limiting activity. Discussed home health PT.  Agreeable.

## 2023-10-12 NOTE — Assessment & Plan Note (Signed)
Has been evaluated and followed by [facility].  Continue risk factor modification.  Continue daily aspirin.

## 2023-10-12 NOTE — Assessment & Plan Note (Signed)
Sugars as outlined.  Watches diet.   Lab Results  Component Value Date   HGBA1C 4.9 07/08/2023

## 2023-10-12 NOTE — Assessment & Plan Note (Signed)
Followed by hematology.  History of AVM.  Also with CKD.  Receiving IV iron.  Followed by Dr Donneta Romberg.  Last hgb 11.6.

## 2023-10-12 NOTE — Assessment & Plan Note (Signed)
Blood pressure as outlined. Follow pressures.  Denies dizziness or light headedness.

## 2023-10-28 LAB — HM DIABETES EYE EXAM

## 2023-10-29 ENCOUNTER — Emergency Department: Payer: Medicare Other

## 2023-10-29 ENCOUNTER — Other Ambulatory Visit: Payer: Medicare Other

## 2023-10-29 ENCOUNTER — Ambulatory Visit: Payer: Medicare Other | Admitting: Internal Medicine

## 2023-10-29 ENCOUNTER — Inpatient Hospital Stay
Admission: EM | Admit: 2023-10-29 | Discharge: 2023-11-03 | DRG: 065 | Disposition: A | Discharge status: Home Health | Payer: Medicare Other | Attending: Internal Medicine | Admitting: Internal Medicine

## 2023-10-29 ENCOUNTER — Inpatient Hospital Stay: Payer: Medicare Other

## 2023-10-29 ENCOUNTER — Other Ambulatory Visit: Payer: Self-pay

## 2023-10-29 ENCOUNTER — Ambulatory Visit: Payer: Medicare Other

## 2023-10-29 DIAGNOSIS — G8191 Hemiplegia, unspecified affecting right dominant side: Secondary | ICD-10-CM | POA: Diagnosis present

## 2023-10-29 DIAGNOSIS — Z85828 Personal history of other malignant neoplasm of skin: Secondary | ICD-10-CM

## 2023-10-29 DIAGNOSIS — R410 Disorientation, unspecified: Principal | ICD-10-CM

## 2023-10-29 DIAGNOSIS — I480 Paroxysmal atrial fibrillation: Secondary | ICD-10-CM | POA: Diagnosis present

## 2023-10-29 DIAGNOSIS — Z83438 Family history of other disorder of lipoprotein metabolism and other lipidemia: Secondary | ICD-10-CM

## 2023-10-29 DIAGNOSIS — Z833 Family history of diabetes mellitus: Secondary | ICD-10-CM

## 2023-10-29 DIAGNOSIS — Z888 Allergy status to other drugs, medicaments and biological substances status: Secondary | ICD-10-CM

## 2023-10-29 DIAGNOSIS — K219 Gastro-esophageal reflux disease without esophagitis: Secondary | ICD-10-CM | POA: Diagnosis present

## 2023-10-29 DIAGNOSIS — I6349 Cerebral infarction due to embolism of other cerebral artery: Secondary | ICD-10-CM | POA: Diagnosis not present

## 2023-10-29 DIAGNOSIS — T45515D Adverse effect of anticoagulants, subsequent encounter: Secondary | ICD-10-CM

## 2023-10-29 DIAGNOSIS — I639 Cerebral infarction, unspecified: Secondary | ICD-10-CM | POA: Diagnosis present

## 2023-10-29 DIAGNOSIS — E1122 Type 2 diabetes mellitus with diabetic chronic kidney disease: Secondary | ICD-10-CM | POA: Diagnosis present

## 2023-10-29 DIAGNOSIS — H53461 Homonymous bilateral field defects, right side: Secondary | ICD-10-CM | POA: Diagnosis present

## 2023-10-29 DIAGNOSIS — Z8249 Family history of ischemic heart disease and other diseases of the circulatory system: Secondary | ICD-10-CM

## 2023-10-29 DIAGNOSIS — N1831 Chronic kidney disease, stage 3a: Secondary | ICD-10-CM | POA: Insufficient documentation

## 2023-10-29 DIAGNOSIS — Z1152 Encounter for screening for COVID-19: Secondary | ICD-10-CM

## 2023-10-29 DIAGNOSIS — R29705 NIHSS score 5: Secondary | ICD-10-CM | POA: Diagnosis present

## 2023-10-29 DIAGNOSIS — Z90710 Acquired absence of both cervix and uterus: Secondary | ICD-10-CM

## 2023-10-29 DIAGNOSIS — K5520 Angiodysplasia of colon without hemorrhage: Secondary | ICD-10-CM | POA: Diagnosis present

## 2023-10-29 DIAGNOSIS — D509 Iron deficiency anemia, unspecified: Secondary | ICD-10-CM | POA: Diagnosis present

## 2023-10-29 DIAGNOSIS — I959 Hypotension, unspecified: Secondary | ICD-10-CM | POA: Diagnosis not present

## 2023-10-29 DIAGNOSIS — Z7982 Long term (current) use of aspirin: Secondary | ICD-10-CM

## 2023-10-29 DIAGNOSIS — Z800 Family history of malignant neoplasm of digestive organs: Secondary | ICD-10-CM

## 2023-10-29 DIAGNOSIS — Z66 Do not resuscitate: Secondary | ICD-10-CM | POA: Diagnosis not present

## 2023-10-29 DIAGNOSIS — Z881 Allergy status to other antibiotic agents status: Secondary | ICD-10-CM

## 2023-10-29 DIAGNOSIS — Z602 Problems related to living alone: Secondary | ICD-10-CM | POA: Diagnosis present

## 2023-10-29 DIAGNOSIS — I129 Hypertensive chronic kidney disease with stage 1 through stage 4 chronic kidney disease, or unspecified chronic kidney disease: Secondary | ICD-10-CM | POA: Diagnosis present

## 2023-10-29 DIAGNOSIS — Z8049 Family history of malignant neoplasm of other genital organs: Secondary | ICD-10-CM

## 2023-10-29 DIAGNOSIS — E785 Hyperlipidemia, unspecified: Secondary | ICD-10-CM | POA: Diagnosis present

## 2023-10-29 DIAGNOSIS — E559 Vitamin D deficiency, unspecified: Secondary | ICD-10-CM | POA: Diagnosis present

## 2023-10-29 DIAGNOSIS — Z96642 Presence of left artificial hip joint: Secondary | ICD-10-CM | POA: Diagnosis present

## 2023-10-29 DIAGNOSIS — Z79899 Other long term (current) drug therapy: Secondary | ICD-10-CM

## 2023-10-29 DIAGNOSIS — E119 Type 2 diabetes mellitus without complications: Secondary | ICD-10-CM

## 2023-10-29 DIAGNOSIS — F32A Depression, unspecified: Secondary | ICD-10-CM | POA: Diagnosis present

## 2023-10-29 DIAGNOSIS — I63412 Cerebral infarction due to embolism of left middle cerebral artery: Secondary | ICD-10-CM

## 2023-10-29 DIAGNOSIS — H5461 Unqualified visual loss, right eye, normal vision left eye: Secondary | ICD-10-CM | POA: Diagnosis present

## 2023-10-29 DIAGNOSIS — R414 Neurologic neglect syndrome: Secondary | ICD-10-CM | POA: Diagnosis present

## 2023-10-29 DIAGNOSIS — Z8673 Personal history of transient ischemic attack (TIA), and cerebral infarction without residual deficits: Secondary | ICD-10-CM | POA: Diagnosis present

## 2023-10-29 DIAGNOSIS — R4781 Slurred speech: Secondary | ICD-10-CM

## 2023-10-29 DIAGNOSIS — N3020 Other chronic cystitis without hematuria: Secondary | ICD-10-CM | POA: Diagnosis present

## 2023-10-29 DIAGNOSIS — Z87891 Personal history of nicotine dependence: Secondary | ICD-10-CM

## 2023-10-29 DIAGNOSIS — I2510 Atherosclerotic heart disease of native coronary artery without angina pectoris: Secondary | ICD-10-CM | POA: Diagnosis present

## 2023-10-29 DIAGNOSIS — I10 Essential (primary) hypertension: Secondary | ICD-10-CM | POA: Diagnosis present

## 2023-10-29 LAB — CBC WITH DIFFERENTIAL/PLATELET
Abs Immature Granulocytes: 0.02 10*3/uL (ref 0.00–0.07)
Basophils Absolute: 0.1 10*3/uL (ref 0.0–0.1)
Basophils Relative: 1 %
Eosinophils Absolute: 0.2 10*3/uL (ref 0.0–0.5)
Eosinophils Relative: 3 %
HCT: 36.5 % (ref 36.0–46.0)
Hemoglobin: 12 g/dL (ref 12.0–15.0)
Immature Granulocytes: 0 %
Lymphocytes Relative: 26 %
Lymphs Abs: 1.6 10*3/uL (ref 0.7–4.0)
MCH: 33.7 pg (ref 26.0–34.0)
MCHC: 32.9 g/dL (ref 30.0–36.0)
MCV: 102.5 fL — ABNORMAL HIGH (ref 80.0–100.0)
Monocytes Absolute: 0.5 10*3/uL (ref 0.1–1.0)
Monocytes Relative: 8 %
Neutro Abs: 3.8 10*3/uL (ref 1.7–7.7)
Neutrophils Relative %: 62 %
Platelets: 170 10*3/uL (ref 150–400)
RBC: 3.56 MIL/uL — ABNORMAL LOW (ref 3.87–5.11)
RDW: 13.6 % (ref 11.5–15.5)
WBC: 6.3 10*3/uL (ref 4.0–10.5)
nRBC: 0 % (ref 0.0–0.2)

## 2023-10-29 LAB — BASIC METABOLIC PANEL
Anion gap: 7 (ref 5–15)
BUN: 24 mg/dL — ABNORMAL HIGH (ref 8–23)
CO2: 22 mmol/L (ref 22–32)
Calcium: 8.8 mg/dL — ABNORMAL LOW (ref 8.9–10.3)
Chloride: 108 mmol/L (ref 98–111)
Creatinine, Ser: 1.09 mg/dL — ABNORMAL HIGH (ref 0.44–1.00)
GFR, Estimated: 49 mL/min — ABNORMAL LOW (ref >60)
Glucose, Bld: 99 mg/dL (ref 70–99)
Potassium: 3.8 mmol/L (ref 3.5–5.1)
Sodium: 137 mmol/L (ref 135–145)

## 2023-10-29 NOTE — ED Triage Notes (Signed)
EMS brings pt in from home; st eyes dilated yesterday and now having diff seeing out of rt eye

## 2023-10-29 NOTE — ED Triage Notes (Signed)
Pt to ED via EMS from home, per daughter pt called he this morning and states that she felt like something was wrong. Pt reports feeling confused. Pt had her eyes dilated yesterday and is now having blurry vision in her right eye. Per family pt cannot remember how to do things that she normally does on a daily basis. Pt lives alone and takes care of herself. Pt denies numbness or weakness in extremities.

## 2023-10-30 ENCOUNTER — Inpatient Hospital Stay
Admit: 2023-10-30 | Discharge: 2023-10-30 | Disposition: A | Discharge status: Home / Self Care | Payer: Medicare Other | Attending: Student in an Organized Health Care Education/Training Program | Admitting: Student in an Organized Health Care Education/Training Program

## 2023-10-30 ENCOUNTER — Inpatient Hospital Stay: Payer: Medicare Other

## 2023-10-30 ENCOUNTER — Other Ambulatory Visit: Payer: Self-pay

## 2023-10-30 DIAGNOSIS — K5520 Angiodysplasia of colon without hemorrhage: Secondary | ICD-10-CM | POA: Diagnosis present

## 2023-10-30 DIAGNOSIS — I480 Paroxysmal atrial fibrillation: Secondary | ICD-10-CM | POA: Diagnosis present

## 2023-10-30 DIAGNOSIS — R414 Neurologic neglect syndrome: Secondary | ICD-10-CM | POA: Diagnosis present

## 2023-10-30 DIAGNOSIS — E1122 Type 2 diabetes mellitus with diabetic chronic kidney disease: Secondary | ICD-10-CM | POA: Diagnosis present

## 2023-10-30 DIAGNOSIS — Z85828 Personal history of other malignant neoplasm of skin: Secondary | ICD-10-CM | POA: Diagnosis not present

## 2023-10-30 DIAGNOSIS — Z8249 Family history of ischemic heart disease and other diseases of the circulatory system: Secondary | ICD-10-CM | POA: Diagnosis not present

## 2023-10-30 DIAGNOSIS — G8191 Hemiplegia, unspecified affecting right dominant side: Secondary | ICD-10-CM | POA: Diagnosis present

## 2023-10-30 DIAGNOSIS — M7062 Trochanteric bursitis, left hip: Secondary | ICD-10-CM | POA: Diagnosis not present

## 2023-10-30 DIAGNOSIS — Z87891 Personal history of nicotine dependence: Secondary | ICD-10-CM | POA: Diagnosis not present

## 2023-10-30 DIAGNOSIS — E785 Hyperlipidemia, unspecified: Secondary | ICD-10-CM | POA: Diagnosis present

## 2023-10-30 DIAGNOSIS — N1831 Chronic kidney disease, stage 3a: Secondary | ICD-10-CM | POA: Diagnosis present

## 2023-10-30 DIAGNOSIS — I129 Hypertensive chronic kidney disease with stage 1 through stage 4 chronic kidney disease, or unspecified chronic kidney disease: Secondary | ICD-10-CM | POA: Diagnosis present

## 2023-10-30 DIAGNOSIS — H53461 Homonymous bilateral field defects, right side: Secondary | ICD-10-CM | POA: Diagnosis present

## 2023-10-30 DIAGNOSIS — I63412 Cerebral infarction due to embolism of left middle cerebral artery: Secondary | ICD-10-CM

## 2023-10-30 DIAGNOSIS — N1832 Chronic kidney disease, stage 3b: Secondary | ICD-10-CM

## 2023-10-30 DIAGNOSIS — I639 Cerebral infarction, unspecified: Secondary | ICD-10-CM

## 2023-10-30 DIAGNOSIS — I2510 Atherosclerotic heart disease of native coronary artery without angina pectoris: Secondary | ICD-10-CM | POA: Diagnosis present

## 2023-10-30 DIAGNOSIS — D696 Thrombocytopenia, unspecified: Secondary | ICD-10-CM

## 2023-10-30 DIAGNOSIS — Z800 Family history of malignant neoplasm of digestive organs: Secondary | ICD-10-CM | POA: Diagnosis not present

## 2023-10-30 DIAGNOSIS — Z7982 Long term (current) use of aspirin: Secondary | ICD-10-CM | POA: Diagnosis not present

## 2023-10-30 DIAGNOSIS — F32A Depression, unspecified: Secondary | ICD-10-CM | POA: Diagnosis present

## 2023-10-30 DIAGNOSIS — Z1152 Encounter for screening for COVID-19: Secondary | ICD-10-CM | POA: Diagnosis not present

## 2023-10-30 DIAGNOSIS — N3020 Other chronic cystitis without hematuria: Secondary | ICD-10-CM | POA: Diagnosis present

## 2023-10-30 DIAGNOSIS — E1136 Type 2 diabetes mellitus with diabetic cataract: Secondary | ICD-10-CM | POA: Diagnosis not present

## 2023-10-30 DIAGNOSIS — I6349 Cerebral infarction due to embolism of other cerebral artery: Secondary | ICD-10-CM | POA: Diagnosis present

## 2023-10-30 DIAGNOSIS — E559 Vitamin D deficiency, unspecified: Secondary | ICD-10-CM | POA: Diagnosis present

## 2023-10-30 DIAGNOSIS — R410 Disorientation, unspecified: Secondary | ICD-10-CM | POA: Diagnosis not present

## 2023-10-30 DIAGNOSIS — Z8673 Personal history of transient ischemic attack (TIA), and cerebral infarction without residual deficits: Secondary | ICD-10-CM | POA: Diagnosis present

## 2023-10-30 DIAGNOSIS — H5461 Unqualified visual loss, right eye, normal vision left eye: Secondary | ICD-10-CM | POA: Diagnosis present

## 2023-10-30 DIAGNOSIS — Z66 Do not resuscitate: Secondary | ICD-10-CM | POA: Diagnosis not present

## 2023-10-30 DIAGNOSIS — D509 Iron deficiency anemia, unspecified: Secondary | ICD-10-CM | POA: Diagnosis present

## 2023-10-30 DIAGNOSIS — I4891 Unspecified atrial fibrillation: Secondary | ICD-10-CM | POA: Diagnosis not present

## 2023-10-30 DIAGNOSIS — E1159 Type 2 diabetes mellitus with other circulatory complications: Secondary | ICD-10-CM

## 2023-10-30 DIAGNOSIS — I770 Arteriovenous fistula, acquired: Secondary | ICD-10-CM

## 2023-10-30 DIAGNOSIS — Z8049 Family history of malignant neoplasm of other genital organs: Secondary | ICD-10-CM | POA: Diagnosis not present

## 2023-10-30 DIAGNOSIS — M25552 Pain in left hip: Secondary | ICD-10-CM | POA: Diagnosis not present

## 2023-10-30 DIAGNOSIS — D631 Anemia in chronic kidney disease: Secondary | ICD-10-CM

## 2023-10-30 DIAGNOSIS — I700 Atherosclerosis of aorta: Secondary | ICD-10-CM

## 2023-10-30 LAB — URINALYSIS, ROUTINE W REFLEX MICROSCOPIC
Bilirubin Urine: NEGATIVE
Glucose, UA: NEGATIVE mg/dL
Ketones, ur: NEGATIVE mg/dL
Nitrite: NEGATIVE
Protein, ur: NEGATIVE mg/dL
Specific Gravity, Urine: 1.005 (ref 1.005–1.030)
pH: 6 (ref 5.0–8.0)

## 2023-10-30 LAB — TROPONIN I (HIGH SENSITIVITY): Troponin I (High Sensitivity): 26 ng/L — ABNORMAL HIGH (ref <18)

## 2023-10-30 LAB — CBG MONITORING, ED
Glucose-Capillary: 101 mg/dL — ABNORMAL HIGH (ref 70–99)
Glucose-Capillary: 102 mg/dL — ABNORMAL HIGH (ref 70–99)
Glucose-Capillary: 118 mg/dL — ABNORMAL HIGH (ref 70–99)
Glucose-Capillary: 101 mg/dL — ABNORMAL HIGH (ref 70–99)

## 2023-10-30 LAB — LIPID PANEL
Cholesterol: 128 mg/dL (ref 0–200)
HDL: 60 mg/dL (ref >40)
LDL Cholesterol: 46 mg/dL (ref 0–99)
Total CHOL/HDL Ratio: 2.1 {ratio}
Triglycerides: 110 mg/dL (ref <150)
VLDL: 22 mg/dL (ref 0–40)

## 2023-10-30 LAB — PHOSPHORUS: Phosphorus: 3.1 mg/dL (ref 2.5–4.6)

## 2023-10-30 LAB — TSH: TSH: 2.134 u[IU]/mL (ref 0.350–4.500)

## 2023-10-30 LAB — VITAMIN D 25 HYDROXY (VIT D DEFICIENCY, FRACTURES): Vit D, 25-Hydroxy: 16.17 ng/mL — ABNORMAL LOW (ref 30–100)

## 2023-10-30 LAB — MAGNESIUM: Magnesium: 2.3 mg/dL (ref 1.7–2.4)

## 2023-10-30 MED ORDER — ACETAMINOPHEN 160 MG/5ML PO SOLN: 650.0000 mg | ORAL | Status: DC | PRN

## 2023-10-30 MED ORDER — MORPHINE SULFATE (PF) 2 MG/ML IV SOLN
1.0000 mg | INTRAVENOUS | Status: DC | PRN
  Administered 2023-10-30: 1 mg via INTRAVENOUS
  Filled 2023-10-30: qty 1

## 2023-10-30 MED ORDER — INSULIN ASPART 100 UNIT/ML IJ SOLN
0.0000 [IU] | Freq: Every day | INTRAMUSCULAR | Status: DC
  Administered 2023-10-31: 2 [IU] via SUBCUTANEOUS
  Filled 2023-10-30: qty 1

## 2023-10-30 MED ORDER — ASPIRIN 81 MG PO CHEW
324.0000 mg | CHEWABLE_TABLET | Freq: Once | ORAL | Status: AC
  Administered 2023-10-30: 324 mg via ORAL
  Filled 2023-10-30: qty 4

## 2023-10-30 MED ORDER — ACETAMINOPHEN 650 MG RE SUPP: 650.0000 mg | RECTAL | Status: DC | PRN

## 2023-10-30 MED ORDER — ROSUVASTATIN CALCIUM 20 MG PO TABS
40.0000 mg | ORAL_TABLET | Freq: Every day | ORAL | Status: DC
  Administered 2023-10-30 – 2023-11-03 (×5): 40 mg via ORAL
  Filled 2023-10-30 (×5): qty 2

## 2023-10-30 MED ORDER — ACETAMINOPHEN 325 MG PO TABS
650.0000 mg | ORAL_TABLET | ORAL | Status: DC | PRN
  Administered 2023-10-31 – 2023-11-02 (×5): 650 mg via ORAL
  Filled 2023-10-30 (×5): qty 2

## 2023-10-30 MED ORDER — STROKE: EARLY STAGES OF RECOVERY BOOK: Freq: Once | Status: AC

## 2023-10-30 MED ORDER — TRAZODONE HCL 50 MG PO TABS: 25.0000 mg | ORAL_TABLET | Freq: Every evening | ORAL | Status: DC | PRN

## 2023-10-30 MED ORDER — VITAMIN D (ERGOCALCIFEROL) 1.25 MG (50000 UNIT) PO CAPS
50000.0000 [IU] | ORAL_CAPSULE | ORAL | Status: DC
  Administered 2023-10-31: 50000 [IU] via ORAL
  Filled 2023-10-30: qty 1

## 2023-10-30 MED ORDER — ASPIRIN 81 MG PO TBEC
81.0000 mg | DELAYED_RELEASE_TABLET | Freq: Every day | ORAL | Status: DC
  Administered 2023-10-30 – 2023-10-31 (×2): 81 mg via ORAL
  Filled 2023-10-30 (×2): qty 1

## 2023-10-30 MED ORDER — ENOXAPARIN SODIUM 30 MG/0.3ML IJ SOSY
30.0000 mg | PREFILLED_SYRINGE | INTRAMUSCULAR | Status: DC
  Administered 2023-10-30 – 2023-10-31 (×2): 30 mg via SUBCUTANEOUS
  Filled 2023-10-30 (×2): qty 0.3

## 2023-10-30 MED ORDER — INSULIN ASPART 100 UNIT/ML IJ SOLN
0.0000 [IU] | Freq: Three times a day (TID) | INTRAMUSCULAR | Status: DC
Start: 2023-10-30 — End: 2023-11-03
  Administered 2023-10-31: 3 [IU] via SUBCUTANEOUS
  Administered 2023-11-01: 5 [IU] via SUBCUTANEOUS
  Administered 2023-11-02 – 2023-11-03 (×2): 2 [IU] via SUBCUTANEOUS
  Filled 2023-10-30 (×4): qty 1

## 2023-10-30 MED ORDER — IOHEXOL 350 MG/ML SOLN
75.0000 mL | Freq: Once | INTRAVENOUS | Status: AC | PRN
  Administered 2023-10-30: 75 mL via INTRAVENOUS

## 2023-10-30 MED ORDER — ACETAMINOPHEN 500 MG PO TABS
1000.0000 mg | ORAL_TABLET | Freq: Once | ORAL | Status: AC
  Administered 2023-10-30: 1000 mg via ORAL
  Filled 2023-10-30: qty 2

## 2023-10-30 NOTE — ED Notes (Addendum)
Patient and family not allowing staff to obtain blood pressure at this time until finished eating. Patient eating dinner at this time

## 2023-10-30 NOTE — ED Notes (Signed)
Patient unhooked and ambulated to restroom. Patient X2 assist to the restroom.

## 2023-10-30 NOTE — TOC Initial Note (Signed)
Transition of Care [facility]) - Initial/Assessment Note    Patient Details  Name: Jocelyn Elliott MRN: [id number] Date of Birth: 06-18-1932  [facility] of [facility]) CM/SW Contact:    Marquita Palms, LCSW Phone Number: 10/30/2023, 11:58 AM  Clinical Narrative:                  CSW met with the patient and her daughter [name] bedside. Patient's daughter has MD appointment and [facility] [place]. Patient daughter reported that she currently has [facility]. Patient has RW and lives independent. Patients daughter reports she is expecting her to go home and she will care for her. Not agreeable to SNF placement. Awaiting admission.       Patient Goals and CMS Choice            Expected Discharge Plan and Services                                              Prior Living Arrangements/Services                       Activities of Daily Living      Permission Sought/Granted                  Emotional Assessment              Admission diagnosis:  CVA (cerebral vascular accident) [facility]) [I63.9] Patient Active Problem List   Diagnosis Date Noted   CVA (cerebral vascular accident) (HCC) 10/30/2023   Stage 3a chronic kidney disease (HCC) 10/30/2023   CAP (community acquired pneumonia) 03/01/2023   SOB (shortness of breath) 02/28/2023   Ear pain, left 04/11/2021   COVID-19 virus infection 04/11/2021   Aortic atherosclerosis (HCC) 04/10/2021   Thrombocytopenia (HCC) 04/10/2021   Fall 04/10/2021   Difficulty with speech 02/12/2021   Peripheral edema 01/02/2021   Anemia of chronic kidney failure, stage 3 (moderate) (HCC) 07/20/2019   Chest pain 04/17/2019   Bradycardia 02/02/2019   Paroxysmal atrial fibrillation (HCC) 02/02/2019   Stage 3b chronic kidney disease (HCC) 10/18/2018   SI (sacroiliac) joint dysfunction 01/07/2018   Dizziness 05/22/2017   History of total left hip replacement 05/17/2017   Trochanteric bursitis of left hip  05/17/2017   IDA (iron deficiency anemia) 09/20/2016   Hypotension 04/06/2016   Other specified respiratory disorders 04/06/2016   Health care maintenance 10/28/2015   2-vessel coronary artery disease 09/22/2015   SOB (shortness of breath) on exertion 09/20/2015   Knee pain, bilateral 09/20/2015   Sleeping difficulty 07/24/2015   Hepatomegaly 07/24/2015   Sleep disorder 07/24/2015   Hepatomegaly, not elsewhere classified 07/24/2015   Generalized anxiety disorder 04/23/2015   Hip pain 10/11/2013   Pain in hip 10/11/2013   Tendonitis 07/19/2013   Leg numbness 04/07/2013   CAD (coronary artery disease) 09/05/2012   Hypertension 09/05/2012   Hypercholesteremia 09/05/2012   Diabetes mellitus (HCC) 09/05/2012   Carotid arterial disease (HCC) 09/05/2012   Normocytic anemia 09/05/2012   Disorder of artery or arteriole (HCC) 09/05/2012   Essential (primary) hypertension 09/05/2012   Anemia 09/05/2012   PCP:  Dale Geneva, MD Pharmacy:   [phone] [place], [place] - [address] [place] [address] Phone: [phone] Fax: [phone]  [facility] - [place], [place] - [phone]  [address] [place] [address] Phone: [phone] Fax: [phone]     Social Determinants of Health (SDOH) Social History: SDOH Screenings   Food Insecurity: No Food Insecurity (04/02/2023)  Housing: Low Risk  (04/02/2023)  Transportation Needs: No Transportation Needs (04/02/2023)  Utilities: Not At Risk (04/02/2023)  Alcohol Screen: Low Risk  (04/02/2023)  Depression (PHQ2-9): Low Risk  (04/02/2023)  Financial Resource Strain: Low Risk  (04/02/2023)  Physical Activity: Insufficiently Active (04/02/2023)  Social Connections: Socially Integrated (04/02/2023)  Stress: No Stress Concern Present (04/02/2023)  Tobacco Use: Medium Risk (10/29/2023)   SDOH Interventions:     Readmission Risk Interventions    10/30/2023   11:54 AM   Readmission Risk Prevention Plan  Post Dischage Appt Complete  Medication Screening Complete  Transportation Screening Complete

## 2023-10-30 NOTE — Assessment & Plan Note (Signed)
Receives Retacrit infusions with oncology

## 2023-10-30 NOTE — Assessment & Plan Note (Signed)
 Renal function at baseline

## 2023-10-30 NOTE — Assessment & Plan Note (Signed)
Sliding scale insulin coverage 

## 2023-10-30 NOTE — Progress Notes (Signed)
PHARMACIST - PHYSICIAN COMMUNICATION  CONCERNING:  Enoxaparin (Lovenox) for DVT Prophylaxis    RECOMMENDATION: Patient was prescribed enoxaprin 40mg  q24 hours for VTE prophylaxis.   Filed Weights   10/29/23 2301  Weight: 62.6 kg (138 lb)    Body mass index is 27.87 kg/m.  Estimated Creatinine Clearance: 28.2 mL/min (A) (by C-G formula based on SCr of 1.09 mg/dL (H)).  Patient is candidate for enoxaparin 30mg  every 24 hours based on CrCl <39ml/min or Weight <45kg  DESCRIPTION: Pharmacy has adjusted enoxaparin dose per [facility] policy.  Patient is now receiving enoxaparin 30 mg every 24 hours   Otelia Sergeant, PharmD, [facility] 10/30/2023 1:37 AM

## 2023-10-30 NOTE — Evaluation (Signed)
Occupational Therapy Evaluation Patient Details Name: Jocelyn Elliott MRN: [id number] DOB: [birth year] Today's Date: 10/30/2023   History of Present Illness 89 y.o. female who presents to the ED with confusion, difficulty with speech, unsteady gait, blurred vision in her right eye and pain in her right arm and leg. MRI head findings: acute infarct in the left thalamus capsular, periatrial, and medial temporal areas with PMHx: A-fib not on anticoagulation, chronic IDA followed by oncology, CKD 3, CAD, prior TIA s/p bilateral carotid endarterectomy(recent duplex 08/2023 with1-39% RICA and 40-50% LICA restenosis)   Clinical Impression   Pt was seen for OT evaluation this date. Prior to hospital admission, pt lived at home alone and was MOD I/Ind with ADLs, IADLs and mobility. Daughter reports use of RW in the home or no AD and IND with ADLs and simple IADLs. Daughter brought in meals and drove pt to appts, etc. Pt utilized a [facility] or rollator for community mobility.  Pt presents to acute OT demonstrating impaired ADL performance and functional mobility 2/2 weakness, balance deficits, visual and coordination deficits (See OT problem list for additional functional deficits). Pt currently requires Min to CGA for STS from recliner to RW, Min/CGA for safety with mobility d/t impulsivity, safety awareness deficits and visual deficits on R side. R sided inattention from visual field deficit. BUE ROM WFL, RUE strength 3+/5 grossly. Min A needed for toilet transfer using grab bar, CGA to Min A needed for standing balance during clothing management and hygiene. Sensation intact. RLE weakness notable during gait. Oriented to person and daughter in room, otherwise unable to recall DOB, current year, location, etc. Pt would benefit from skilled OT services to address noted impairments and functional limitations (see below for any additional details) in order to maximize safety and independence while minimizing falls risk  and caregiver burden. Do anticipate the need for follow up OT services upon acute hospital DC.        If plan is discharge home, recommend the following: A little help with walking and/or transfers;A little help with bathing/dressing/bathroom;Help with stairs or ramp for entrance;Assist for transportation;Assistance with cooking/housework;Supervision due to cognitive status;Direct supervision/assist for medications management;Direct supervision/assist for financial management    Functional Status Assessment  Patient has had a recent decline in their functional status and demonstrates the ability to make significant improvements in function in a reasonable and predictable amount of time.  Equipment Recommendations  None recommended by OT    Recommendations for Other Services       Precautions / Restrictions Precautions Precautions: Fall Restrictions Weight Bearing Restrictions: No      Mobility Bed Mobility               General bed mobility comments: NT up in recliner pre/post session    Transfers Overall transfer level: Needs assistance Equipment used: Rolling walker (2 wheels) Transfers: Sit to/from Stand Sit to Stand: Min assist, Contact guard assist           General transfer comment: Min A for 1st STS, CGA for 2nd to RW with cueing for hand placement on RW      Balance Overall balance assessment: Needs assistance Sitting-balance support: Feet supported Sitting balance-Leahy Scale: Good Sitting balance - Comments: steady reaching within BOS Postural control: Right lateral lean Standing balance support: Bilateral upper extremity supported, During functional activity, Reliant on assistive device for balance Standing balance-Leahy Scale: Fair Standing balance comment: Min/CGA on R side d/t some lateral lean at times from R sided weakness  and impulsivity for safety with dependence on RW or grab bar to maintain balance                           ADL  either performed or assessed with clinical judgement   ADL Overall ADL's : Needs assistance/impaired Eating/Feeding: Supervision/ safety;Sitting                       Toilet Transfer: Minimal assistance;Regular Toilet;Rolling walker (2 wheels);Grab bars   Toileting- Clothing Manipulation and Hygiene: Contact guard assist;Sit to/from stand               Vision Patient Visual Report: Peripheral vision impairment Vision Assessment?: Yes Tracking/Visual Pursuits: Requires cues, head turns, or add eye shifts to track Visual Fields: Right homonymous hemianopsia;Right visual field deficit     Perception         Praxis         Pertinent Vitals/Pain Pain Assessment Pain Assessment: No/denies pain     Extremity/Trunk Assessment Upper Extremity Assessment Upper Extremity Assessment: Generalized weakness;Right hand dominant;RUE deficits/detail RUE Deficits / Details: full ROM, sensation; slower FMC on R side; 3+/5 strength in RUE RUE Sensation: WNL RUE Coordination: decreased fine motor   Lower Extremity Assessment Lower Extremity Assessment: Generalized weakness;RLE deficits/detail;Defer to PT evaluation RLE Deficits / Details: RLE weakness-slower on R side during mobility RLE Sensation: WNL       Communication Communication Communication: Difficulty communicating thoughts/reduced clarity of speech Cueing Techniques: Verbal cues;Tactile cues   Cognition Arousal: Alert Behavior During Therapy: WFL for tasks assessed/performed Overall Cognitive Status: Impaired/Different from baseline Area of Impairment: Orientation, Safety/judgement                 Orientation Level: Person       Safety/Judgement: Decreased awareness of safety     General Comments: oriented to person and daughter in room, stated it was March 1933, unable to state DOB correctly, able to follow commands; difficulty with word findin at times     General Comments       Exercises  Other Exercises Other Exercises: Edu on role of OT in acute setting and IPR level of care for stroke rehab.   Shoulder Instructions      Home Living Family/patient expects to be discharged to:: Private residence Living Arrangements: Alone Available Help at Discharge: Family;Available 24 hours/day (daughter and son in law live next door) Type of Home: House       Home Layout: One level     Bathroom Shower/Tub: Sponge bathes at baseline   Bathroom Toilet: Handicapped height     Home Equipment: Cane - single point;Rollator (4 wheels);Rolling Walker (2 wheels);Shower seat;Tub bench;BSC/3in1;Lift chair          Prior Functioning/Environment Prior Level of Function : Independent/Modified Independent             Mobility Comments: SPC or rollator for community mobility; RW or no AD in the home; not driving ADLs Comments: IND with ADLs, can prepare coffee, oatmeal, daughter cooks higher level meals        OT Problem List: Decreased strength;Decreased coordination;Decreased cognition;Decreased safety awareness;Impaired balance (sitting and/or standing);Impaired vision/perception      OT Treatment/Interventions: Self-care/ADL training;Therapeutic exercise;Therapeutic activities;Cognitive remediation/compensation;Neuromuscular education;DME and/or AE instruction;Patient/family education;Balance training    OT Goals(Current goals can be found in the care plan section) Acute Rehab OT Goals Patient Stated Goal: improve strength, safety and IND OT Goal Formulation:  With patient/family Time For Goal Achievement: 11/13/23 Potential to Achieve Goals: Good ADL Goals Pt Will Perform Grooming: with set-up;sitting Pt Will Perform Lower Body Bathing: with supervision;sitting/lateral leans;sit to/from stand Pt Will Perform Upper Body Dressing: sitting;with set-up Pt Will Perform Lower Body Dressing: with supervision;sitting/lateral leans;sit to/from stand Pt Will Transfer to Toilet:  with supervision;grab bars;regular height toilet;ambulating Pt Will Perform Toileting - Clothing Manipulation and hygiene: with supervision;sit to/from stand;sitting/lateral leans Additional ADL Goal #1: Pt will utilize compensatory strategies for vision to maximize her safety/IND with ADL performance and mobility.  OT Frequency: Min 1X/week    Co-evaluation              AM-PAC OT "6 Clicks" Daily Activity     Outcome Measure Help from another person eating meals?: A Little Help from another person taking care of personal grooming?: A Little Help from another person toileting, which includes using toliet, bedpan, or urinal?: A Lot Help from another person bathing (including washing, rinsing, drying)?: A Lot Help from another person to put on and taking off regular upper body clothing?: A Little Help from another person to put on and taking off regular lower body clothing?: A Lot 6 Click Score: 15   End of Session Equipment Utilized During Treatment: Rolling walker (2 wheels) (recliner follow) Nurse Communication: Mobility status  Activity Tolerance: Patient tolerated treatment well Patient left: in chair;with call bell/phone within reach;with family/visitor present  OT Visit Diagnosis: Unsteadiness on feet (R26.81);Other abnormalities of gait and mobility (R26.89);Muscle weakness (generalized) (M62.81)                Time: 7829-5621 OT Time Calculation (min): 41 min Charges:  OT General Charges $OT Visit: 1 Visit OT Evaluation $OT Eval Moderate Complexity: 1 Mod OT Treatments $Self Care/Home Management : 23-37 mins Seraiah Nowack, OTR/L 10/30/23, 4:03 PM  Marqui Formby E Geraldyn Shain 10/30/2023, 3:59 PM

## 2023-10-30 NOTE — Assessment & Plan Note (Addendum)
History of TIA History of bilateral carotid endarterectomy Patient with strokelike symptoms presenting outside tPA window, initial CT negative Permissive hypertension for first 24-48 hrs post stroke onset: Prn Labetalol IV or Vasotec IV If BP greater than 220/120  Statins for LDL goal less than 70 ASA 81mg  daily, Plavix 75mg  daily x 3 weeks then monotherapy thereafter Telemetry, echo, MRI/MRA---> MRI positive for stroke pending official read.  Patient was unable to tolerate lying flat to get MRI Avoid dextrose containing fluids, Maintain euglycemia, euthermia Neuro checks q4 hrs x 24 hrs and then per shift Head of bed 30 degrees Physical therapy/Occupational therapy/Speech therapy if failed dysphagia screen Neurology consult to follow

## 2023-10-30 NOTE — Progress Notes (Addendum)
[facility] Progress Note  Patient: Jocelyn Elliott    ZOX:[id number]  DOA: 10/29/2023     Date of Service: the patient was seen and examined on 10/30/2023  No chief complaint on file.  Brief hospital course: IMONIE SPELL is a 89 y.o. female with medical history significant for Prior history of A-fib not on anticoagulation, chronic IDA followed by oncology, CKD 3, CAD, prior TIA s/p bilateral carotid endarterectomy(recent duplex 08/2023 with1-39% RICA and 40-50% LICA restenosis) who presents to the ED with confusion, difficulty with speech, unsteady gait, blurred vision in her right eye and pain in her right arm and leg.  History is provided by her daughter at the bedside.  She states patient lives alone and takes care of her bills and is fully independent at baseline.  She states the day prior she went to have an eye exam and had eye dilatation and was in her usual state of health.  On the day of arrival however she awoke and " did not seem right" because of the aforementioned symptoms. ED course and data review: Vitals within normal limits Pertinent workup includes the following: CBC unremarkable, BMP notable for baseline creatinine of 1.09 Troponin 26 EKG, personally viewed and interpreted showing A-fib at 86 with no ischemic ST-T wave changes CT head nonacute but shows small old left cerebellar infarct and encephalomalacia Patient was given chewable aspirin 325 Hospitalist consulted for admission for stroke workup.     Assessment and Plan:  # CVA (cerebral vascular accident) # History of TIA # History of bilateral carotid endarterectomy Patient with strokelike symptoms presenting outside tPA window, initial CT negative Permissive hypertension for first 24-48 hrs post stroke onset: Prn Labetalol IV or Vasotec IV If BP greater than 220/120  Statins for LDL goal less than 70.  TSH 2.1 within normal range ASA 81mg  daily for now Telemetry, echo, MRI/MRA---> MRI positive for  stroke pending official read.  Patient was unable to tolerate lying flat to get MRI Avoid dextrose containing fluids, Maintain euglycemia, euthermia Neuro checks q4 hrs x 24 hrs and then per shift Head of bed 30 degrees Physical therapy/Occupational therapy/Speech therapy if failed dysphagia screen Neurology consulted, rec DOAC aftr clearance from GI and Heme/onc D/w GI and oncologist, recommended to start DOAC and monitor H&H and transfuse as needed 12/11 discussed with patient's daughter regarding DOACs, she stated that patient is having dark stools all the time due to AVM and high risk for bleeding.  Discussed with neurology, patient remains at high risk for GI bleeding, risk and benefit needs to be considered, will discuss with patient's daughter again tomorrow regarding DOAC Patient is to follow with a pulmonologist as an outpatient for prism glasses  # Paroxysmal atrial fibrillation Prior history of A-fib not on anticoagulation due to AVM, GI bleed, still having dark black stools and iron deficiency, getting IV iron infusions. Currently in A-fib with controlled rate Will benefit from anticoagulation secondary stroke prevention Will discuss with patient's daughter again tomorrow a.m.   # stage 3a chronic kidney disease Renal function at baseline   # IDA (iron deficiency anemia) Receives Retacrit infusions with oncology   # Diabetes mellitus (HCC) Sliding scale insulin coverage   # CAD (coronary artery disease) Continue aspirin and rosuvastatin  # Vitamin D deficiency: started vitamin D 50,000 units p.o. weekly, follow with PCP to repeat vitamin D level after 3 to 6 months.   Body mass index is 27.87 kg/m.  Interventions:  Diet: Heart healthy and  carb modified diet DVT Prophylaxis: Subcutaneous Lovenox   Advance goals of care discussion: DNR-limited  Family Communication: family was present at bedside, at the time of interview.  The pt provided permission to discuss  medical plan with the family. Opportunity was given to ask question and all questions were answered satisfactorily.   Disposition:  Pt is from home, admitted with acute CVA, history of A-fib, remains at high risk for GI bleed, which precludes a safe discharge. Discharge to home, when stable, most likely tomorrow a.m. Follow PT OT and TOC for discharge planning  Subjective: No significant events overnight, patient still has blurry vision, some decrease strength on the right side.  No any other complaints.  Overall she is gradually improving  Physical Exam: General: NAD, lying comfortably Appear in no distress, affect appropriate Eyes: PERRLA ENT: Oral Mucosa Clear, moist  Neck: no JVD,  Cardiovascular: S1 and S2 Present, no Murmur,  Respiratory: good respiratory effort, Bilateral Air entry equal and Decreased, no Crackles, no wheezes Abdomen: Bowel Sound present, Soft and no tenderness,  Skin: no rashes Extremities: 2+ pedal edema R>L, no calf tenderness Neurologic: Right-sided weakness power 4/5 Gait not checked due to patient safety concerns  Vitals:   10/30/23 1351 10/30/23 1358 10/30/23 1430 10/30/23 1500  BP: (!) 78/51 (!) 87/54 (!) 103/59 99/60  Pulse: 74 77 84 86  Resp: 13 16 17 16   Temp:      TempSrc:      SpO2: 93% 93% 96% 97%  Weight:      Height:       No intake or output data in the 24 hours ending 10/30/23 1712 Filed Weights   10/29/23 2301  Weight: 62.6 kg    Data Reviewed: I have personally reviewed and interpreted daily labs, tele strips, imagings as discussed above. I reviewed all nursing notes, pharmacy notes, vitals, pertinent old records I have discussed plan of care as described above with RN and patient/family.  CBC: Recent Labs  Lab 10/29/23 2303  WBC 6.3  NEUTROABS 3.8  HGB 12.0  HCT 36.5  MCV 102.5*  PLT 170   Basic Metabolic Panel: Recent Labs  Lab 10/29/23 2303  NA 137  K 3.8  CL 108  CO2 22  GLUCOSE 99  BUN 24*  CREATININE  1.09*  CALCIUM 8.8*  MG 2.3  PHOS 3.1    Studies: CT ANGIO HEAD NECK W WO CM  Result Date: 10/30/2023 CLINICAL DATA:  Neuro deficit, acute, stroke suspected EXAM: CT ANGIOGRAPHY HEAD AND NECK WITH AND WITHOUT CONTRAST TECHNIQUE: Multidetector CT imaging of the head and neck was performed using the standard protocol during bolus administration of intravenous contrast. Multiplanar CT image reconstructions and MIPs were obtained to evaluate the vascular anatomy. Carotid stenosis measurements (when applicable) are obtained utilizing NASCET criteria, using the distal internal carotid diameter as the denominator. RADIATION DOSE REDUCTION: This exam was performed according to the departmental dose-optimization program which includes automated exposure control, adjustment of the mA and/or kV according to patient size and/or use of iterative reconstruction technique. CONTRAST:  75mL OMNIPAQUE IOHEXOL 350 MG/ML SOLN COMPARISON:  None Available. FINDINGS: CT HEAD FINDINGS Brain: No hemorrhage. No hydrocephalus. No extra-axial fluid collection. There is a chronic left superior cerebellar infarct. No CT evidence of an acute cortical infarct. Mass effect. No mass lesion. There is a background of moderate chronic microvascular ischemic change. Vascular: No hyperdense vessel or unexpected calcification. Skull: Normal. Negative for fracture or focal lesion. Sinuses/Orbits: No middle ear or  mastoid effusion. Paranasal sinuses are clear. Bilateral lens replacement. Orbits are otherwise unremarkable. Other: None. Review of the MIP images confirms the above findings CTA NECK FINDINGS Aortic arch: Standard branching. Imaged portion shows no evidence of aneurysm or dissection. Atherosclerotic calcifications of the aortic arch. Moderate narrowing of the origin of the left subclavian artery likely the brachiocephalic artery. Right carotid system: No evidence of dissection, stenosis (50% or greater), or occlusion. Mild narrowing in  the cavernous segment of the right ICA Left carotid system: No evidence of dissection or occlusion. Approximately 60% stenosis of the origin of the left ICA secondary to soft atherosclerotic plaque. Somewhat beaded and irregular appearance of the distal left common carotid artery, as can be seen in the setting of FMD. Vertebral arteries: Right dominant. Moderate to severe stenosis of the origin of the left vertebral artery secondary to calcified atherosclerotic plaque no evidence of dissection, stenosis (50% or greater), or occlusion. Skeleton: Grade 1 anterolisthesis of C3 on C4 and C4 on C5 Other neck: Negative. Upper chest: Negative. Review of the MIP images confirms the above findings CTA HEAD FINDINGS Anterior circulation: No significant stenosis, proximal occlusion, aneurysm, or vascular malformation. Posterior circulation: No significant stenosis, proximal occlusion, aneurysm, or vascular malformation. Moderate narrowing in the P2 segment of the left PCA Venous sinuses: As permitted by contrast timing, patent. Anatomic variants: No Review of the MIP images confirms the above findings IMPRESSION: 1. No hemorrhage or CT evidence of an acute cortical infarct. 2. No intracranial large vessel occlusion. 3. Approximately 60% stenosis of the origin of the left ICA secondary to soft atherosclerotic plaque. 4. Moderate to severe stenosis of the origin of the left vertebral artery secondary to calcified atherosclerotic plaque. 5. Somewhat beaded and irregular appearance of the distal left common carotid artery and proximal left ICA, as can be seen in the setting of FMD. Aortic Atherosclerosis (ICD10-I70.0). Electronically Signed   By: Lorenza Cambridge M.D.   On: 10/30/2023 11:18   MR BRAIN WO CONTRAST  Result Date: 10/30/2023 CLINICAL DATA:  Onset of right-sided weakness and confusion with cysts speech difficulty today. EXAM: MRI HEAD WITHOUT CONTRAST TECHNIQUE: Multiplanar, multiecho pulse sequences of the brain and  surrounding structures were obtained without intravenous contrast. COMPARISON:  Head CT from yesterday. FINDINGS: Brain: Band of restricted diffusion at the upper and lateral left thalamus extending laterally to the periatrial white matter and medial temporal cortex. There is a background of chronic small vessel ischemic gliosis in the deep cerebral white matter. Chronic left cerebellar infarction. No evidence of hemorrhage, hydrocephalus, mass, or collection. Vascular: Grossly preserved major flow voids by axial T2 weighted imaging. Skull and upper cervical spine: No gross marrow lesion Sinuses/Orbits: Negative Other: Progressive severe motion artifact with numerous nondiagnostic sequences. The scan was truncated before MRA and coronal T2 weighted imaging. IMPRESSION: Very motion degraded and truncated brain MRI but still positive for acute infarct in the left thalamus capsular, periatrial, and medial temporal areas (thalamoperforator versus anterior choroidal infarction). Electronically Signed   By: Tiburcio Pea M.D.   On: 10/30/2023 04:39   CT HEAD WO CONTRAST ( )  Result Date: 10/29/2023 CLINICAL DATA:  Altered mental status. EXAM: CT HEAD WITHOUT CONTRAST TECHNIQUE: Contiguous axial images were obtained from the base of the skull through the vertex without intravenous contrast. RADIATION DOSE REDUCTION: This exam was performed according to the departmental dose-optimization program which includes automated exposure control, adjustment of the mA and/or kV according to patient size and/or use of iterative reconstruction technique.  COMPARISON:  Head CT dated 02/15/2021. FINDINGS: Brain: Moderate age-related atrophy and chronic microvascular ischemic changes. 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 There is no acute intracranial hemorrhage. No mass effect or midline shift. No extra-axial fluid collection. Vascular: No hyperdense vessel or unexpected calcification. Skull: Normal. Negative  for fracture or focal lesion. Sinuses/Orbits: No acute finding. Other: None IMPRESSION: 1. No acute intracranial pathology. 2. Moderate age-related atrophy and chronic microvascular ischemic changes. 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 Electronically Signed   By: Elgie Collard M.D.   On: 10/29/2023 23:35    Scheduled Meds:  [START ON 10/31/2023]  stroke: early stages of recovery book   Does not apply Once   aspirin EC  81 mg Oral Daily   enoxaparin (LOVENOX) injection  30 mg Subcutaneous Q24H   insulin aspart  0-15 Units Subcutaneous TID WC   insulin aspart  0-5 Units Subcutaneous QHS   rosuvastatin  40 mg Oral Daily   Continuous Infusions: PRN Meds: acetaminophen **OR** acetaminophen (TYLENOL) oral liquid 160 mg/5 mL **OR** acetaminophen, morphine injection, traZODone  Time spent: 55 minutes  Author: Gillis Santa. MD [facility] 10/30/2023 5:12 PM  To reach On-call, see care teams to locate the attending and reach out to them via [email or web address]. If 7PM-7AM, please contact night-coverage If you still have difficulty reaching the attending provider, please page the [facility] (Director on Call) for [facility] on amion for assistance.

## 2023-10-30 NOTE — H&P (Addendum)
History and Physical    Patient: Jocelyn Elliott JXB:[id number] DOB: 1932-03-24 DOA: 10/29/2023 DOS: the patient was seen and examined on 10/30/2023 PCP: Dale Quechee, MD  Patient coming from: Home  Chief Complaint: No chief complaint on file.   HPI: TEKLA MATOUSEK is a 89 y.o. female with medical history significant for Prior history of A-fib not on anticoagulation, chronic IDA followed by oncology, CKD 3, CAD, prior TIA s/p bilateral carotid endarterectomy(recent duplex 08/2023 with1-39% RICA and 40-50% LICA restenosis) who presents to the ED with confusion, difficulty with speech, unsteady gait, blurred vision in her right eye and pain in her right arm and leg.  History is provided by her daughter at the bedside.  She states patient lives alone and takes care of her bills and is fully independent at baseline.  She states the day prior she went to have an eye exam and had eye dilatation and was in her usual state of health.  On the day of arrival however she awoke and " did not seem right" because of the aforementioned symptoms. ED course and data review: Vitals within normal limits Pertinent workup includes the following: CBC unremarkable, BMP notable for baseline creatinine of 1.09 Troponin 26 EKG, personally viewed and interpreted showing A-fib at 88 with no ischemic ST-T wave changes CT head nonacute but shows small old left cerebellar infarct and encephalomalacia Patient was given chewable aspirin 325 Hospitalist consulted for admission for stroke workup.     Past Medical History:  Diagnosis Date   Arthritis    AV malformation of gastrointestinal tract    Basal cell carcinoma 08/07/2023   Central upper forehead. Nodular pattern. Mohs 09/09/23   Blood in stool    CAD (coronary artery disease)    Carotid arterial disease (HCC)    Cataracts, bilateral    Chronic blood loss anemia    Chronic cystitis    CKD (chronic kidney disease), stage III (HCC)    Complication of  anesthesia    reaction to propofol - caused heart to pause   Depression    Diabetes mellitus (HCC)    GERD (gastroesophageal reflux disease)    Hyperlipidemia    Hypertension    IDA (iron deficiency anemia)    Stress incontinence    TIA (transient ischemic attack) 03/2021   No deficits   Wears dentures    full upper and lower   Past Surgical History:  Procedure Laterality Date   ABDOMINAL HYSTERECTOMY  11/19/1970   ovaries left in place   APPENDECTOMY  11/19/1990   BACK SURGERY  06/08/2010   L3, L4, L5   CARDIAC CATHETERIZATION  02/2001   CAROTID ARTERY ANGIOPLASTY  09/19/2002   CARPAL TUNNEL RELEASE  1991, 92   right then left    CATARACT EXTRACTION W/PHACO Left 02/13/2022   Procedure: CATARACT EXTRACTION PHACO AND INTRAOCULAR LENS PLACEMENT (IOC) LEFT DIABETIC 14.90 01:26.8;  Surgeon: Galen Manila, MD;  Location: [facility];  Service: Ophthalmology;  Laterality: Left;  Diabetic   CATARACT EXTRACTION W/PHACO Right 03/06/2022   Procedure: CATARACT EXTRACTION PHACO AND INTRAOCULAR LENS PLACEMENT (IOC) RIGHT DIABETIC 9.66 00:53.7;  Surgeon: Galen Manila, MD;  Location: [facility];  Service: Ophthalmology;  Laterality: Right;  Diabetic   CHOLECYSTECTOMY  11/20/1991   COLONOSCOPY WITH PROPOFOL N/A 02/02/2019   Procedure: COLONOSCOPY WITH PROPOFOL;  Surgeon: Scot Jun, MD;  Location: [facility];  Service: Endoscopy;  Laterality: N/A;   ESOPHAGOGASTRODUODENOSCOPY N/A 02/02/2019   Procedure: ESOPHAGOGASTRODUODENOSCOPY (EGD);  Surgeon:  Scot Jun, MD;  Location: [facility];  Service: Endoscopy;  Laterality: N/A;   FOOT SURGERY  06/20/2007   Left   right carotid artery surgery  01/09/2013   Dr. Gilda Crease @ [facility]   TOTAL HIP ARTHROPLASTY  05/03/2011   left 12, right 11/07   Social History:  reports that she quit smoking about 33 years ago. Her smoking use included cigarettes. She has never used smokeless tobacco. She reports that she does  not drink alcohol and does not use drugs.  Allergies  Allergen Reactions   Propofol Other (See Comments)    Caused heart to pause   Ciprofloxacin Rash   Levaquin [Levofloxacin] Rash   Tequin [Gatifloxacin] Hives and Rash    Family History  Problem Relation Age of Onset   Hyperlipidemia Father    Heart disease Father        myocardial infarction   Hypertension Father        Parent   Arthritis Other        parent   Diabetes Other        nephew   Cervical cancer Sister    Rectal cancer Sister    Breast cancer Neg Hx     Prior to Admission medications   Medication Sig Start Date End Date Taking? Authorizing Provider  aspirin 81 MG tablet Take 81 mg by mouth daily.    [provider]  Biotin 5000 MCG CAPS Take 1 capsule by mouth daily.    [provider]  cetirizine (ZYRTEC) 10 MG tablet Take 10 mg by mouth daily.    [provider]  docusate sodium (COLACE) 100 MG capsule Take 100 mg by mouth daily as needed for mild constipation.    [provider]  ferrous sulfate 325 (65 FE) MG EC tablet Take 325 mg by mouth every other day. Every other day    [provider]  guaiFENesin-dextromethorphan (ROBITUSSIN DM) 100-10 MG/5ML syrup Take 5 mLs by mouth every 4 (four) hours as needed for cough. 03/03/23   Darlin Priestly, MD  hydrocortisone 2.5 % cream apply topically 2 times daily as needed for itch at right lower leg 08/07/23   Elie Goody, MD  ketoconazole (NIZORAL) 2 % shampoo MASSAGE INTO SCALP AND LET SIT SEVERAL MINUTES BEFORE RINSING. USE 2-3 TIMES A WEEK. 09/25/23   Willeen Niece, MD  Lancet Device MISC Use as directed to check blood sugars 06/19/23   Dale [place], MD  Lancets [facility] DELICA PLUS LANCET30G) MISC USE AS INSTRUCTED TO CHECK BLOOD SUGARS ONCE DAILY. DX E 11.9 04/03/23   Dale Napier Field, MD  Omega-3 Fatty Acids (FISH OIL) 1200 MG CAPS Take 1 capsule by mouth daily.    [provider]  Probiotic, Lactobacillus,  CAPS Take 1 capsule by mouth daily. 01/03/22   [provider]  rosuvastatin (CRESTOR) 40 MG tablet TAKE 1 TABLET BY MOUTH EVERY DAY 10/04/23   Sherlene Shams, MD  traZODone (DESYREL) 50 MG tablet TAKE 1/2 TO 1 TABLET BY MOUTH AT BEDTIME AS NEEDED FOR SLEEP 06/18/23   Sherlene Shams, MD  triamcinolone cream (KENALOG) 0.1 % Apply 1 Application topically 2 (two) times daily. On affected area on leg.  Use for 7-10 days only - then stop. 03/18/23   Dale [place], MD    Physical Exam: Vitals:   10/30/23 0030 10/30/23 0045 10/30/23 0100 10/30/23 0115  BP: (!) 151/72  106/80   Pulse: 80 82 87 87  Resp: (!) 21 13 13  19  Temp:      TempSrc:      SpO2: 94% 94% 93% 95%  Weight:      Height:       Physical Exam Vitals and nursing note reviewed.  Constitutional:      General: She is not in acute distress. HENT:     Head: Normocephalic and atraumatic.  Cardiovascular:     Rate and Rhythm: Normal rate and regular rhythm.     Heart sounds: Normal heart sounds.  Pulmonary:     Effort: Pulmonary effort is normal.     Breath sounds: Normal breath sounds.  Abdominal:     Palpations: Abdomen is soft.     Tenderness: There is no abdominal tenderness.  Neurological:     Mental Status: Mental status is at baseline. She is disoriented.     Labs on Admission: I have personally reviewed following labs and imaging studies  CBC: Recent Labs  Lab 10/29/23 2303  WBC 6.3  NEUTROABS 3.8  HGB 12.0  HCT 36.5  MCV 102.5*  PLT 170   Basic Metabolic Panel: Recent Labs  Lab 10/29/23 2303  NA 137  K 3.8  CL 108  CO2 22  GLUCOSE 99  BUN 24*  CREATININE 1.09*  CALCIUM 8.8*  MG 2.3   GFR: Estimated Creatinine Clearance: 28.2 mL/min (A) (by C-G formula based on SCr of 1.09 mg/dL (H)). Liver Function Tests: No results for input(s): "AST", "ALT", "ALKPHOS", "BILITOT", "PROT", "ALBUMIN" in the last 168 hours. No results for input(s): "LIPASE", "AMYLASE" in the last 168 hours. No  results for input(s): "AMMONIA" in the last 168 hours. Coagulation Profile: No results for input(s): "INR", "PROTIME" in the last 168 hours. Cardiac Enzymes: No results for input(s): "CKTOTAL", "CKMB", "CKMBINDEX", "TROPONINI" in the last 168 hours. BNP (last 3 results) No results for input(s): "PROBNP" in the last 8760 hours. HbA1C: No results for input(s): "HGBA1C" in the last 72 hours. CBG: No results for input(s): "GLUCAP" in the last 168 hours. Lipid Profile: No results for input(s): "CHOL", "HDL", "LDLCALC", "TRIG", "CHOLHDL", "LDLDIRECT" in the last 72 hours. Thyroid Function Tests: Recent Labs    10/29/23 2303  TSH 2.134   Anemia Panel: No results for input(s): "VITAMINB12", "FOLATE", "FERRITIN", "TIBC", "IRON", "RETICCTPCT" in the last 72 hours. Urine analysis:    Component Value Date/Time   COLORURINE YELLOW (A) 02/28/2023 2318   APPEARANCEUR HAZY (A) 02/28/2023 2318   LABSPEC 1.013 02/28/2023 2318   PHURINE 6.0 02/28/2023 2318   GLUCOSEU NEGATIVE 02/28/2023 2318   GLUCOSEU NEGATIVE 06/22/2015 1331   HGBUR LARGE (A) 02/28/2023 2318   BILIRUBINUR NEGATIVE 02/28/2023 2318   BILIRUBINUR negative 10/13/2018 1116   BILIRUBINUR neg 05/19/2014 0811   KETONESUR NEGATIVE 02/28/2023 2318   PROTEINUR >=300 (A) 02/28/2023 2318   UROBILINOGEN 0.2 10/13/2018 1116   UROBILINOGEN 0.2 06/22/2015 1331   NITRITE NEGATIVE 02/28/2023 2318   LEUKOCYTESUR TRACE (A) 02/28/2023 2318    Radiological Exams on Admission: CT HEAD WO CONTRAST ( )  Result Date: 10/29/2023 CLINICAL DATA:  Altered mental status. EXAM: CT HEAD WITHOUT CONTRAST TECHNIQUE: Contiguous axial images were obtained from the base of the skull through the vertex without intravenous contrast. RADIATION DOSE REDUCTION: This exam was performed according to the departmental dose-optimization program which includes automated exposure control, adjustment of the mA and/or kV according to patient size and/or use of iterative  reconstruction technique. COMPARISON:  Head CT dated 02/15/2021. FINDINGS: Brain: Moderate age-related atrophy and chronic microvascular ischemic changes. Small  old left cerebellar infarct and encephalomalacia. There is no acute intracranial hemorrhage. No mass effect or midline shift. No extra-axial fluid collection. Vascular: No hyperdense vessel or unexpected calcification. Skull: Normal. Negative for fracture or focal lesion. Sinuses/Orbits: No acute finding. Other: None IMPRESSION: 1. No acute intracranial pathology. 2. Moderate age-related atrophy and chronic microvascular ischemic changes. 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 Electronically Signed   By: Elgie Collard M.D.   On: 10/29/2023 23:35     Data Reviewed: Relevant notes from primary care and specialist visits, past discharge summaries as available in EHR, including Care Everywhere. Prior diagnostic testing as pertinent to current admission diagnoses Updated medications and problem lists for reconciliation ED course, including vitals, labs, imaging, treatment and response to treatment Triage notes, nursing and pharmacy notes and ED provider's notes Notable results as noted in HPI   Assessment and Plan: * CVA (cerebral vascular accident) (HCC) History of TIA History of bilateral carotid endarterectomy Patient with strokelike symptoms presenting outside tPA window, initial CT negative Permissive hypertension for first 24-48 hrs post stroke onset: Prn Labetalol IV or Vasotec IV If BP greater than 220/120  Statins for LDL goal less than 70 ASA 81mg  daily, Plavix 75mg  daily x 3 weeks then monotherapy thereafter Telemetry, echo, MRI/MRA---> MRI positive for stroke pending official read.  Patient was unable to tolerate lying flat to get MRI Avoid dextrose containing fluids, Maintain euglycemia, euthermia Neuro checks q4 hrs x 24 hrs and then per shift Head of bed 30 degrees Physical therapy/Occupational  therapy/Speech therapy if failed dysphagia screen Neurology consult to follow   Paroxysmal atrial fibrillation (HCC) Prior history of A-fib not on anticoagulation ?  Possibly related to anemia of uncertain etiology Currently in A-fib with controlled rate Will benefit from anticoagulation secondary stroke prevention Will defer decision on full dose Lovenox pending neurology consult  Stage 3a chronic kidney disease (HCC) Renal function at baseline  IDA (iron deficiency anemia) Receives Retacrit infusions with oncology  Diabetes mellitus (HCC) Sliding scale insulin coverage  CAD (coronary artery disease) Continue aspirin and rosuvastatin    DVT prophylaxis: Lovenox  Consults: Neurology  Advance Care Planning:   Code Status: Limited: Do not attempt resuscitation (DNR) -DNR-LIMITED -Do Not Intubate/DNI    Family Communication: Daughter at bedside  Disposition Plan: Back to previous home environment  Severity of Illness: The appropriate patient status for this patient is INPATIENT. Inpatient status is judged to be reasonable and necessary in order to provide the required intensity of service to ensure the patient's safety. The patient's presenting symptoms, physical exam findings, and initial radiographic and laboratory data in the context of their chronic comorbidities is felt to place them at high risk for further clinical deterioration. Furthermore, it is not anticipated that the patient will be medically stable for discharge from the hospital within 2 midnights of admission.   * I certify that at the point of admission it is my clinical judgment that the patient will require inpatient hospital care spanning beyond 2 midnights from the point of admission due to high intensity of service, high risk for further deterioration and high frequency of surveillance required.*  Author: Andris Baumann, MD 10/30/2023 3:30 AM  For on call review [email or web address].

## 2023-10-30 NOTE — ED Provider Notes (Addendum)
[facility] Provider Note    Event Date/Time   First MD Initiated Contact with Patient 10/29/23 2334     (approximate)   History   No chief complaint on file.   HPI  Jocelyn Elliott is a 89 y.o. female past medical history significant for paroxysmal atrial fibrillation not on anticoagulation, iron deficiency anemia, CKD stage III, CAD, history of TIA, who presents to the emergency department with confusion and trouble with her speech.  Patient had her eyes dilated yesterday afternoon at a routine appointment.  Afterwards had not had return to her vision and was complaining of some mild ongoing vision changes.  Patient's daughter at bedside last saw her at 8 PM yesterday and she was in her normal state of health but still complaining of some mild change in vision.  This morning at approximately 1030 she called her and states that she did not feel well.  Patient has been having mild confusion and not acting her normal self.  Trouble with her speech and having slurring of words.  Complaining of some mild pain or weakness to her right side.  Denies any falls or trauma.  Denies any chest pain or shortness of breath.  Denies any dysuria, urinary urgency or frequency.  Atrial fibrillation with 1 prior episode following a colonoscopy.  Patient was evaluated by cardiology and ultimately the patient has not had a known episode of atrial fibrillation since that time and has not been on anticoagulation.  Did have a history of a slow GI bleed and has required blood transfusions.     Physical Exam   Triage Vital Signs: ED Triage Vitals  Encounter Vitals Group     BP 10/29/23 2302 135/65     Systolic BP Percentile --      Diastolic BP Percentile --      Pulse Rate 10/29/23 2302 92     Resp 10/29/23 2302 18     Temp 10/29/23 2302 98.4 F (36.9 C)     Temp Source 10/29/23 2302 Oral     SpO2 10/29/23 2255 96 %     Weight 10/29/23 2301 138 lb (62.6 kg)     Height 10/29/23  2301 4\' 11"  (1.499 m)     Head Circumference --      Peak Flow --      Pain Score 10/29/23 2301 0     Pain Loc --      Pain Education --      Exclude from Growth Chart --     Most recent vital signs: Vitals:   10/29/23 2255 10/29/23 2302  BP:  135/65  Pulse:  92  Resp:  18  Temp:  98.4 F (36.9 C)  SpO2: 96% 99%    Physical Exam Constitutional:      Appearance: She is well-developed.  HENT:     Head: Atraumatic.  Eyes:     General: Visual field deficit (Right eye with right lateral visual field defect) present.     Extraocular Movements: Extraocular movements intact.     Conjunctiva/sclera: Conjunctivae normal.     Pupils: Pupils are equal, round, and reactive to light.  Cardiovascular:     Rate and Rhythm: Tachycardia present. Rhythm irregular.  Pulmonary:     Effort: No respiratory distress.     Breath sounds: No wheezing.  Abdominal:     General: There is no distension.  Musculoskeletal:        General: Normal range of motion.  Cervical back: Normal range of motion.  Skin:    General: Skin is warm.  Neurological:     Mental Status: She is alert. Mental status is at baseline.     GCS: GCS eye subscore is 4. GCS verbal subscore is 4. GCS motor subscore is 6.     Sensory: Sensation is intact.     Motor: Motor function is intact.     Coordination: Coordination is intact.     Comments: Slurring of speech, able to name objects.     IMPRESSION / MDM / ASSESSMENT AND PLAN / ED COURSE  I reviewed the triage vital signs and the nursing notes.  Differential diagnosis including intracranial hemorrhage, CVA, electrolyte abnormality, anemia, urinary tract infection  On arrival patient is outside of the window for TNK.  Does not meet criteria for LVO  EKG  I, Corena Herter, the attending physician, personally viewed and interpreted this ECG.   Atrial fibrillation with controlled rate while on cardiac telemetry.  RADIOLOGY I independently reviewed imaging, my  interpretation of imaging: CT scan of the head without signs of intracranial hemorrhage.  Old infarcts noted.  LABS (all labs ordered are listed, but only abnormal results are displayed) Labs interpreted as -    Labs Reviewed  CBC WITH DIFFERENTIAL/PLATELET - Abnormal; Notable for the following components:      Result Value   RBC 3.56 (*)    MCV 102.5 (*)    All other components within normal limits  BASIC METABOLIC PANEL - Abnormal; Notable for the following components:   BUN 24 (*)    Creatinine, Ser 1.09 (*)    Calcium 8.8 (*)    GFR, Estimated 49 (*)    All other components within normal limits  TROPONIN I (HIGH SENSITIVITY) - Abnormal; Notable for the following components:   Troponin I (High Sensitivity) 26 (*)    All other components within normal limits  TSH  MAGNESIUM  URINALYSIS, ROUTINE W REFLEX MICROSCOPIC     MDM  On chart review patient recent EKGs without signs of atrial fibrillation.  Concern for recurrent atrial fibrillation that may have caused a new CVA.  Only significant deficit on my exam is some mild confusion with slurring speech.  No significant electrolyte abnormalities or signs of leukocytosis.  Creatinine appears to be at her baseline.  Patient was given aspirin.  Concern for possible CVA.  Consulted the hospitalist for further CVA workup and recurrent atrial fibrillation.     PROCEDURES:  Critical Care performed: No  Procedures  Patient's presentation is most consistent with acute presentation with potential threat to life or bodily function.   MEDICATIONS ORDERED IN ED: Medications  aspirin chewable tablet 324 mg (has no administration in time range)  acetaminophen (TYLENOL) tablet 1,000 mg (1,000 mg Oral Given 10/30/23 0013)    FINAL CLINICAL IMPRESSION(S) / ED DIAGNOSES   Final diagnoses:  Confusion  Slurred speech  Paroxysmal atrial fibrillation (HCC)     Rx / DC Orders   ED Discharge Orders     None        Note:   This document was prepared using Dragon voice recognition software and may include unintentional dictation errors.   Corena Herter, MD 10/30/23 [id number]    Corena Herter, MD 10/30/23 [phone]

## 2023-10-30 NOTE — ED Notes (Signed)
 Pt admitted to [facility]

## 2023-10-30 NOTE — ED Notes (Signed)
Secure chat sent to Lucianne Muss, Dileep,MD that stated " Since around 1pm, the patients blood pressure has started trending down. This is a third blood pressure reading that just took 87/54 (63). The others were similar."   No new orders at this time

## 2023-10-30 NOTE — Assessment & Plan Note (Addendum)
Prior history of A-fib not on anticoagulation ?  Possibly related to anemia of uncertain etiology Currently in A-fib with controlled rate Will benefit from anticoagulation secondary stroke prevention Will defer decision on full dose Lovenox pending neurology consult

## 2023-10-30 NOTE — Progress Notes (Signed)
SLP Cancellation Note  Patient Details Name: Jocelyn Elliott MRN: [id number] DOB: 08-Sep-1932   Cancelled treatment:       Reason Eval/Treat Not Completed:  (chart reviewed; consulted NSG re: pt's status)  Noted MRI results.  Per NSG report this PM, pt is verbal and makes her full wants/needs known appropriately to NSG. Speech is 100% intelligible. Noted OT note this PM indicating pt is having Orientation deficits(?).  As pt remains in the ED, and it is only 16 hours since admit to the ED, ST services will f/u w/ pt tomorrow w/ SLE post 24 hours and hopefully when in a room w/ less distractions. NSG agreed.     Jerilynn Som, MS, CCC-SLP Speech Language Pathologist Rehab Services; [facility] [phone] (ascom) Jocelyn Elliott 10/30/2023, 4:08 PM

## 2023-10-30 NOTE — Progress Notes (Signed)
Inpatient Rehab Admissions Coordinator:  ? ?Per therapy recommendations,  patient was screened for CIR candidacy by Devaney Segers, MS, CCC-SLP. At this time, Pt. Appears to be a a potential candidate for CIR. I will place   order for rehab consult per protocol for full assessment. Please contact me any with questions. ? ?Trine Fread, MS, CCC-SLP ?Rehab Admissions Coordinator  ?[phone] (celll) ?[phone] (office) ? ?

## 2023-10-30 NOTE — Consult Note (Signed)
NEUROLOGY CONSULT NOTE   Date of service: October 30, 2023 Patient Name: Jocelyn Elliott MRN:  [id number] DOB:  [birth year] Chief Complaint: "Stroke" Requesting Provider: Gillis Santa, MD  History of Present Illness  Jocelyn Elliott is a 89 y.o. 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 She reports going to bed normally on Monday night 10/28/2023 and upon waking up on Tuesday morning, noted that she could not see the right visual field.  She also noticed some heaviness in the right arm and leg along with some word finding difficulty and mild confusion per family. Brought in for further evaluation to the emergency department.  Noted to be in atrial fibrillation.  Admitted for concern for stroke and also for recurrent atrial fibrillation. MRI of the brain was completed that revealed restricted diffusion in the left thalamic capsular region as well as periatrial and medial temporal areas indicating of thalamic capsular perforator versus anterior choroidal artery infarction.   Family reports that the patient has been having some frontal headache as well as repetitive questioning and her memory seems to be somewhat worse because she keeps asking the same questions and forgets that she has had discussions on the same topics multiple times.  LKW: Sometime on the night of 10/28/2023 Modified rankin score:1-lives independently but daughter lives next-door.  She is able to heat her food and eat and bathe herself but requires some help with cooking etc. IV Thrombolysis: Outside window EVT: Outside the  window  NIHSS components Score: Comment  1a Level of Conscious 0[x]  1[]  2[]  3[]      1b LOC Questions 0[x]  1[]  2[]       1c LOC Commands 0[x]  1[]  2[]       2 Best Gaze 0[x]  1[]  2[]       3 Visual 0[]  1[]  2[x]  3[]      4 Facial Palsy 0[x]  1[]  2[]  3[]      5a Motor Arm - left 0[x]  1[]  2[]  3[]  4[]  UN[]    5b Motor Arm - Right 0[x]  1[]  2[]  3[]  4[]  UN[]    6a Motor Leg - Left 0[]  1[x]  2[]  3[]  4[]  UN[]    6b Motor Leg - Right 0[x]  1[]  2[]  3[]  4[]  UN[]    7 Limb Ataxia 0[x]  1[]  2[]  3[]  UN[]     8 Sensory 0[x]  1[]  2[]  UN[]      9 Best Language 0[x]  1[]  2[]  3[]      10 Dysarthria 0[x]  1[]  2[]  UN[]      11 Extinct. and Inattention 0[]  1[]  2[x]       TOTAL: 5      ROS  Comprehensive ROS performed and pertinent positives documented in HPI   Past History   Past Medical History:  Diagnosis Date   Arthritis    AV malformation of gastrointestinal tract    Basal cell carcinoma 08/07/2023   Central upper forehead. Nodular pattern. Mohs 09/09/23   Blood in stool    CAD (coronary artery disease)    Carotid arterial disease (HCC)    Cataracts, bilateral    Chronic blood loss anemia    Chronic cystitis    CKD (chronic kidney disease), stage III (HCC)  Complication of anesthesia    reaction to propofol - caused heart to pause   Depression    Diabetes mellitus (HCC)    GERD (gastroesophageal reflux disease)    Hyperlipidemia    Hypertension    IDA (iron deficiency anemia)    Stress incontinence    TIA (transient ischemic attack) 03/2021   No deficits   Wears dentures    full upper and lower    Past Surgical History:  Procedure Laterality Date   ABDOMINAL HYSTERECTOMY  11/19/1970   ovaries left in place   APPENDECTOMY  11/19/1990   BACK SURGERY  06/08/2010   L3, L4, L5   CARDIAC CATHETERIZATION  02/2001   CAROTID ARTERY ANGIOPLASTY  09/19/2002   CARPAL TUNNEL RELEASE  1991, 92   right then left    CATARACT EXTRACTION W/PHACO Left 02/13/2022   Procedure: CATARACT EXTRACTION PHACO AND  INTRAOCULAR LENS PLACEMENT (IOC) LEFT DIABETIC 14.90 01:26.8;  Surgeon: Galen Manila, MD;  Location: [facility];  Service: Ophthalmology;  Laterality: Left;  Diabetic   CATARACT EXTRACTION W/PHACO Right 03/06/2022   Procedure: CATARACT EXTRACTION PHACO AND INTRAOCULAR LENS PLACEMENT (IOC) RIGHT DIABETIC 9.66 00:53.7;  Surgeon: Galen Manila, MD;  Location: [facility];  Service: Ophthalmology;  Laterality: Right;  Diabetic   CHOLECYSTECTOMY  11/20/1991   COLONOSCOPY WITH PROPOFOL N/A 02/02/2019   Procedure: COLONOSCOPY WITH PROPOFOL;  Surgeon: Scot Jun, MD;  Location: [facility] END[facility];  Service: Endoscopy;  Laterality: N/A;   ESOPHAGOGASTRODUODENOSCOPY N/A 02/02/2019   Procedure: ESOPHAGOGASTRODUODENOSCOPY (EGD);  Surgeon: Scot Jun, MD;  Location: [facility] ENDOSCOPY;  Service: Endoscopy;  Laterality: N/A;   FOOT SURGERY  06/20/2007   Left   right carotid artery surgery  01/09/2013   Dr. Gilda Crease @ [facility]   TOTAL HIP ARTHROPLASTY  05/03/2011   left 12, right 11/07    Family History: Family History  Problem Relation Age of Onset   Hyperlipidemia Father    Heart disease Father        myocardial infarction   Hypertension Father        Parent   Arthritis Other        parent   Diabetes Other        nephew   Cervical cancer Sister    Rectal cancer Sister    Breast cancer Neg Hx     Social History  reports that she quit smoking about 33 years ago. Her smoking use included cigarettes. She has never used smokeless tobacco. She reports that she does not drink alcohol and does not use drugs.  Allergies  Allergen Reactions   Propofol Other (See Comments)    Caused heart to pause   Ciprofloxacin Rash   Levaquin [Levofloxacin] Rash   Tequin [Gatifloxacin] Hives and Rash    Medications   Current Facility-Administered Medications:    [START ON 10/31/2023]  stroke: early stages of recovery book, , Does not apply, Once, Andris Baumann, MD    acetaminophen (TYLENOL) tablet 650 mg, 650 mg, Oral, Q4H PRN **OR** acetaminophen (TYLENOL) 160 MG/5ML solution 650 mg, 650 mg, Per Tube, Q4H PRN **OR** acetaminophen (TYLENOL) suppository 650 mg, 650 mg, Rectal, Q4H PRN, Andris Baumann, MD   aspirin EC tablet 81 mg, 81 mg, Oral, Daily, Lindajo Royal V, MD   enoxaparin (LOVENOX) injection 30 mg, 30 mg, Subcutaneous, Q24H, Para March, Hazel V, MD   insulin aspart (novoLOG) injection 0-15 Units, 0-15 Units, Subcutaneous, TID WC, Andris Baumann, MD   insulin aspart (novoLOG)  injection 0-5 Units, 0-5 Units, Subcutaneous, QHS, Para March, Hazel V, MD   morphine (PF) 2 MG/ML injection 1 mg, 1 mg, Intravenous, Q2H PRN, Lindajo Royal V, MD, 1 mg at 10/30/23 0330   rosuvastatin (CRESTOR) tablet 40 mg, 40 mg, Oral, Daily, Andris Baumann, MD   traZODone (DESYREL) tablet 25-50 mg, 25-50 mg, Oral, QHS PRN, Andris Baumann, MD  Current Outpatient Medications:    aspirin 81 MG tablet, Take 81 mg by mouth daily., Disp: , Rfl:    Biotin 5000 MCG CAPS, Take 1 capsule by mouth daily., Disp: , Rfl:    cetirizine (ZYRTEC) 10 MG tablet, Take 10 mg by mouth daily., Disp: , Rfl:    docusate sodium (COLACE) 100 MG capsule, Take 100 mg by mouth daily as needed for mild constipation., Disp: , Rfl:    ferrous sulfate 325 (65 FE) MG EC tablet, Take 325 mg by mouth every other day. Every other day, Disp: , Rfl:    guaiFENesin-dextromethorphan (ROBITUSSIN DM) 100-10 MG/5ML syrup, Take 5 mLs by mouth every 4 (four) hours as needed for cough., Disp: 118 mL, Rfl: 0   hydrocortisone 2.5 % cream, apply topically 2 times daily as needed for itch at right lower leg, Disp: 30 g, Rfl: 5   ketoconazole (NIZORAL) 2 % shampoo, MASSAGE INTO SCALP AND LET SIT SEVERAL MINUTES BEFORE RINSING. USE 2-3 TIMES A WEEK., Disp: 120 mL, Rfl: 2   Lancet Device MISC, Use as directed to check blood sugars, Disp: 1 each, Rfl: 0   Lancets (ONETOUCH DELICA PLUS LANCET30G) MISC, USE AS INSTRUCTED TO CHECK BLOOD  SUGARS ONCE DAILY. DX E 11.9, Disp: 100 each, Rfl: 12   Omega-3 Fatty Acids (FISH OIL) 1200 MG CAPS, Take 1 capsule by mouth daily., Disp: , Rfl:    Probiotic, Lactobacillus, CAPS, Take 1 capsule by mouth daily., Disp: , Rfl:    rosuvastatin (CRESTOR) 40 MG tablet, TAKE 1 TABLET BY MOUTH EVERY DAY, Disp: 90 tablet, Rfl: 1   traZODone (DESYREL) 50 MG tablet, TAKE 1/2 TO 1 TABLET BY MOUTH AT BEDTIME AS NEEDED FOR SLEEP, Disp: 90 tablet, Rfl: 1   triamcinolone cream (KENALOG) 0.1 %, Apply 1 Application topically 2 (two) times daily. On affected area on leg.  Use for 7-10 days only - then stop., Disp: 30 g, Rfl: 0  Vitals   Vitals:   10/30/23 0115 10/30/23 0500 10/30/23 0800 10/30/23 0907  BP:  115/68 115/60   Pulse: 87 88 78   Resp: 19 15 20    Temp:    (!) 97.4 F (36.3 C)  TempSrc:    Oral  SpO2: 95% 93% 98%   Weight:      Height:        Body mass index is 27.87 kg/m.  Physical Exam  General: Well-developed well-nourished in no distress HEENT: Normocephalic atraumatic Lungs: Clear Cardiovascular: Irregularly irregular Abdomen nondistended nontender Neurological exam Awake alert oriented x 3 No evidence of dysarthria Naming comprehension and repetition are intact but she has some word finding difficulty and definitely is exhibiting repetitive questioning. Cranial nerves II to XII: Pupils equal round reactive light, extraocular movements unhindered, visual field examination reveals a right homonymous hemianopsia Motor examination drift in the left lower extremity-which is baseline due to pain. No significant weakness in the right upper, right lower or left upper extremity on individual muscle testing. Sensation is intact to light touch bilaterally but she extinguishes the right side on double simultaneous stimulation. Coordination: No obvious dysmetria Gait  testing deferred at this time NIH stroke scale documented above   Labs/Imaging/Neurodiagnostic studies   CBC:  Recent  Labs  Lab 10/29/23 2303  WBC 6.3  NEUTROABS 3.8  HGB 12.0  HCT 36.5  MCV 102.5*  PLT 170   Basic Metabolic Panel:  Lab Results  Component Value Date   NA 137 10/29/2023   K 3.8 10/29/2023   CO2 22 10/29/2023   GLUCOSE 99 10/29/2023   BUN 24 (H) 10/29/2023   CREATININE 1.09 (H) 10/29/2023   CALCIUM 8.8 (L) 10/29/2023   GFRNONAA 49 (L) 10/29/2023   GFRAA 46 (L) 07/12/2020   Lipid Panel:  Lab Results  Component Value Date   LDLCALC 46 10/29/2023   HgbA1c:  Lab Results  Component Value Date   HGBA1C 4.9 07/08/2023    APTT  Lab Results  Component Value Date   APTT 22 (L) 02/28/2023   CT Head without contrast(Personally reviewed): No acute intracranial abnormality  MRI Brain(Personally reviewed): Extremely motion degraded but still positive for acute infarct in the left thalamus, capsular region as well as periatrial and medial temporal lobes-thalamic perforator versus anterior choroidal artery territory infarctions.    ASSESSMENT   Jocelyn Elliott is a 89 y.o. female Jocelyn Elliott is a 89 y.o. 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  On examination she has right-sided neglect and right homonymous hemianopsia along with some repetitive questioning and word finding difficulty.  MRI brain reveals a left thalamic capsular as well as left periatrial and medial temporal lobe infarct-possibly from thalamic perforators versus anterior choroidal territory infarctions. Given her history of atrial fibrillation, that might be the culprit but she also has prior history of carotid artery disease and this could be a large  vessel etiology stroke as well. Needs further workup  Impression: Acute ischemic stroke  RECOMMENDATIONS  Agree with admission to the hospitalist Telemetry Frequent rechecks Aspirin 81 for now. Given her paroxysmal atrial fibrillation and strokes, she would ideally require anticoagulation but I have been informed by family that she has had a difficult time with any kind of anticoagulation due to GI AV malformation that bleeds.  I think discussion is warranted with GI/hematology managing her hemoglobin to see if they have any recommendations on or hesitation for starting anticoagulation. 2D echo A1c Lipid panel Statin for LDL greater than 70.  Check lipid panel PT OT Speech therapy Permissive hypertension for another day or so and then start normalizing blood pressure with a goal blood pressure of normotension on discharge Need ophthalmology outpatient consultation for possible prism lenses Will follow Plan relayed to Dr. Lucianne Muss -- Signed, Milon Dikes, MD [facility]

## 2023-10-30 NOTE — Assessment & Plan Note (Signed)
Continue aspirin and rosuvastatin ?

## 2023-10-31 DIAGNOSIS — I63412 Cerebral infarction due to embolism of left middle cerebral artery: Secondary | ICD-10-CM | POA: Diagnosis not present

## 2023-10-31 LAB — CBC
HCT: 38.7 % (ref 36.0–46.0)
Hemoglobin: 12.6 g/dL (ref 12.0–15.0)
MCH: 32.7 pg (ref 26.0–34.0)
MCHC: 32.6 g/dL (ref 30.0–36.0)
MCV: 100.5 fL — ABNORMAL HIGH (ref 80.0–100.0)
Platelets: 217 10*3/uL (ref 150–400)
RBC: 3.85 MIL/uL — ABNORMAL LOW (ref 3.87–5.11)
RDW: 13.5 % (ref 11.5–15.5)
WBC: 9 10*3/uL (ref 4.0–10.5)
nRBC: 0 % (ref 0.0–0.2)

## 2023-10-31 LAB — MAGNESIUM: Magnesium: 2.2 mg/dL (ref 1.7–2.4)

## 2023-10-31 LAB — ECHOCARDIOGRAM COMPLETE
AR max vel: 1.44 cm2
AV Area VTI: 1.43 cm2
AV Area mean vel: 1.41 cm2
AV Mean grad: 10.6 mm[Hg]
AV Peak grad: 18.6 mm[Hg]
Ao pk vel: 2.16 m/s
Area-P 1/2: 4.74 cm2
Height: 59 in
MV VTI: 1.51 cm2
S' Lateral: 3.3 cm
Weight: 2208 [oz_av]

## 2023-10-31 LAB — BASIC METABOLIC PANEL
Anion gap: 8 (ref 5–15)
BUN: 30 mg/dL — ABNORMAL HIGH (ref 8–23)
CO2: 21 mmol/L — ABNORMAL LOW (ref 22–32)
Calcium: 8.9 mg/dL (ref 8.9–10.3)
Chloride: 107 mmol/L (ref 98–111)
Creatinine, Ser: 1.31 mg/dL — ABNORMAL HIGH (ref 0.44–1.00)
GFR, Estimated: 39 mL/min — ABNORMAL LOW (ref >60)
Glucose, Bld: 111 mg/dL — ABNORMAL HIGH (ref 70–99)
Potassium: 4.1 mmol/L (ref 3.5–5.1)
Sodium: 136 mmol/L (ref 135–145)

## 2023-10-31 LAB — CBG MONITORING, ED
Glucose-Capillary: 117 mg/dL — ABNORMAL HIGH (ref 70–99)
Glucose-Capillary: 192 mg/dL — ABNORMAL HIGH (ref 70–99)

## 2023-10-31 LAB — GLUCOSE, CAPILLARY
Glucose-Capillary: 74 mg/dL (ref 70–99)
Glucose-Capillary: 190 mg/dL — ABNORMAL HIGH (ref 70–99)

## 2023-10-31 LAB — PHOSPHORUS: Phosphorus: 3 mg/dL (ref 2.5–4.6)

## 2023-10-31 MED ORDER — APIXABAN 2.5 MG PO TABS
2.5000 mg | ORAL_TABLET | Freq: Two times a day (BID) | ORAL | Status: DC
  Administered 2023-10-31 – 2023-11-03 (×7): 2.5 mg via ORAL
  Filled 2023-10-31 (×7): qty 1

## 2023-10-31 NOTE — Progress Notes (Signed)
Occupational Therapy Treatment Patient Details Name: Jocelyn Elliott MRN: [id number] DOB: 1931/12/25 Today's Date: 10/31/2023   History of present illness 89 y.o. female who presents to the ED with confusion, difficulty with speech, unsteady gait, blurred vision in her right eye and pain in her right arm and leg. MRI head findings: acute infarct in the left thalamus capsular, periatrial, and medial temporal areas with PMHx: A-fib not on anticoagulation, chronic IDA followed by oncology, CKD 3, CAD, prior TIA s/p bilateral carotid endarterectomy(recent duplex 08/2023 with1-39% RICA and 40-50% LICA restenosis)   OT comments  Pt is seated in recliner on arrival. Pleasant and agreeable to OT session. She denies pain. Vision slightly improved today, but remains limited in R eye with peripheral vision. Continues to bump into obstacles on R side during mobility with RW requiring cueing and head turns for compensation. Pt performed mult STS with CGA and engaged in UB/LB bathing/dressing session. Min A needed for LB ADLs d/t inability to safely reach ankles/feet and cueing to use her R hand more during session. Combed hair standing at sink with CGA. No LOB noted during session, but constant CGA/SBA provided for safety. She returned to bed with all needs in place and will cont to require skilled acute OT services to maximize her safety and IND to return to PLOF.       If plan is discharge home, recommend the following:  A little help with walking and/or transfers;A little help with bathing/dressing/bathroom;Help with stairs or ramp for entrance;Assist for transportation;Assistance with cooking/housework;Supervision due to cognitive status;Direct supervision/assist for medications management;Direct supervision/assist for financial management   Equipment Recommendations  None recommended by OT    Recommendations for Other Services      Precautions / Restrictions Precautions Precautions:  Fall Restrictions Weight Bearing Restrictions Per Provider Order: No       Mobility Bed Mobility               General bed mobility comments: NT up in recliner pre/post session    Transfers Overall transfer level: Needs assistance Equipment used: Rolling walker (2 wheels) Transfers: Sit to/from Stand Sit to Stand: Contact guard assist           General transfer comment: CGA for STS from recliner and mult times from EOB during bathing standing/sitting     Balance Overall balance assessment: Needs assistance Sitting-balance support: Feet supported Sitting balance-Leahy Scale: Fair Sitting balance - Comments: steady reaching within BOS, however difficulty reaching ankles/feet during bathing   Standing balance support: Bilateral upper extremity supported, During functional activity, Reliant on assistive device for balance Standing balance-Leahy Scale: Fair Standing balance comment: CGA and RW for safety with mobility                           ADL either performed or assessed with clinical judgement   ADL Overall ADL's : Needs assistance/impaired     Grooming: Wash/dry face;Brushing hair;Supervision/safety;Cueing for sequencing   Upper Body Bathing: Supervision/ safety;Cueing for safety;Standing;Sitting   Lower Body Bathing: Minimal assistance;Sit to/from stand;Sitting/lateral leans   Upper Body Dressing : Sitting;Contact guard assist   Lower Body Dressing: Minimal assistance;Sit to/from stand;Sitting/lateral leans                      Extremity/Trunk Assessment Upper Extremity Assessment RUE Deficits / Details: full ROM, sensation; slower FMC on R side; 3+/5 strength in RUE  Vision   Additional Comments: slight improvement in R sided vision today, but remains limited   Perception     Praxis      Cognition Arousal: Alert Behavior During Therapy: WFL for tasks assessed/performed Overall Cognitive Status: Within  Functional Limits for tasks assessed Area of Impairment: Safety/judgement, Awareness, Problem solving                 Orientation Level: Person, Place       Safety/Judgement: Decreased awareness of deficits, Decreased awareness of safety Awareness: Emergent Problem Solving: Requires verbal cues, Requires tactile cues General Comments: oriented to person and place        Exercises      Shoulder Instructions       General Comments      Pertinent Vitals/ Pain       Pain Assessment Pain Assessment: No/denies pain  Home Living Family/patient expects to be discharged to:: Private residence Living Arrangements: Alone                                      Prior Functioning/Environment              Frequency  Min 1X/week        Progress Toward Goals  OT Goals(current goals can now be found in the care plan section)  Progress towards OT goals: Progressing toward goals  Acute Rehab OT Goals Patient Stated Goal: improve strength, safety and IND OT Goal Formulation: With patient/family Time For Goal Achievement: 11/13/23 Potential to Achieve Goals: Good  Plan      Co-evaluation                 AM-PAC OT "6 Clicks" Daily Activity     Outcome Measure   Help from another person eating meals?: None Help from another person taking care of personal grooming?: A Little Help from another person toileting, which includes using toliet, bedpan, or urinal?: A Little Help from another person bathing (including washing, rinsing, drying)?: A Little Help from another person to put on and taking off regular upper body clothing?: A Little Help from another person to put on and taking off regular lower body clothing?: A Little 6 Click Score: 19    End of Session Equipment Utilized During Treatment: Rolling walker (2 wheels)  OT Visit Diagnosis: Unsteadiness on feet (R26.81);Other abnormalities of gait and mobility (R26.89);Muscle weakness  (generalized) (M62.81)   Activity Tolerance Patient tolerated treatment well   Patient Left in chair;with call bell/phone within reach;with family/visitor present;with nursing/sitter in room   Nurse Communication Mobility status        Time: 1610-9604 OT Time Calculation (min): 31 min  Charges: OT General Charges $OT Visit: 1 Visit OT Treatments $Self Care/Home Management : 23-37 mins  Trust Leh, OTR/L  10/31/23, 4:30 PM   Constance Goltz 10/31/2023, 4:27 PM

## 2023-10-31 NOTE — Progress Notes (Addendum)
NEUROLOGY CONSULT FOLLOW UP NOTE   Date of service: October 31, 2023 Patient Name: Jocelyn Elliott MRN:  [id number] DOB:  06/28/1932   Interval Hx/subjective  Seen and examined No new complaints  Vitals   Vitals:   10/31/23 0230 10/31/23 0300 10/31/23 0330 10/31/23 0800  BP: 117/84 127/71 107/63 (!) 99/52  Pulse: 73 92 84 76  Resp: (!) 23 12 17 13   Temp:   98.2 F (36.8 C)   TempSrc:   Oral   SpO2: 96% 95% 96% 92%  Weight:      Height:         Body mass index is 27.87 kg/m.  Physical Exam   General: Well-developed well-nourished in no distress HEENT: Normocephalic atraumatic Lungs: Clear Cardiovascular: Irregularly irregular Abdomen nondistended nontender Neurological exam Awake alert oriented x 3 No evidence of dysarthria Naming comprehension and repetition are intact but she has some word finding difficulty and definitely is exhibiting repetitive questioning. Cranial nerves II to XII: Pupils equal round reactive light, extraocular movements unhindered, visual field examination reveals a right homonymous hemianopsia Motor examination drift in the left lower extremity-which is baseline due to pain. No significant weakness in the right upper, right lower or left upper extremity on individual muscle testing. Sensation is intact to light touch bilaterally but she extinguishes the right side on double simultaneous stimulation. Coordination: No obvious dysmetria Gait testing deferred at this time  Unchanged exam from yesterday.  Medications  Current Facility-Administered Medications:     stroke: early stages of recovery book, , Does not apply, Once, Andris Baumann, MD   acetaminophen (TYLENOL) tablet 650 mg, 650 mg, Oral, Q4H PRN **OR** acetaminophen (TYLENOL) 160 MG/5ML solution 650 mg, 650 mg, Per Tube, Q4H PRN **OR** acetaminophen (TYLENOL) suppository 650 mg, 650 mg, Rectal, Q4H PRN, Andris Baumann, MD   aspirin EC tablet 81 mg, 81 mg, Oral, Daily, Lindajo Royal V, MD, 81 mg at 10/30/23 1007   enoxaparin (LOVENOX) injection 30 mg, 30 mg, Subcutaneous, Q24H, Lindajo Royal V, MD, 30 mg at 10/30/23 1008   insulin aspart (novoLOG) injection 0-15 Units, 0-15 Units, Subcutaneous, TID WC, Para March, Hazel V, MD   insulin aspart (novoLOG) injection 0-5 Units, 0-5 Units, Subcutaneous, QHS, Lindajo Royal V, MD   morphine (PF) 2 MG/ML injection 1 mg, 1 mg, Intravenous, Q2H PRN, Lindajo Royal V, MD, 1 mg at 10/30/23 0330   rosuvastatin (CRESTOR) tablet 40 mg, 40 mg, Oral, Daily, Lindajo Royal V, MD, 40 mg at 10/30/23 1007   traZODone (DESYREL) tablet 25-50 mg, 25-50 mg, Oral, QHS PRN, Andris Baumann, MD   Vitamin D (Ergocalciferol) (DRISDOL) 1.25 MG (50000 UNIT) capsule 50,000 Units, 50,000 Units, Oral, Q7 days, Gillis Santa, MD  Current Outpatient Medications:    aspirin 81 MG tablet, Take 81 mg by mouth daily., Disp: , Rfl:    Biotin 5000 MCG CAPS, Take 1 capsule by mouth daily., Disp: , Rfl:    cetirizine (ZYRTEC) 10 MG tablet, Take 10 mg by mouth daily., Disp: , Rfl:    docusate sodium (COLACE) 100 MG capsule, Take 100 mg by mouth daily as needed for mild constipation., Disp: , Rfl:    finasteride (PROSCAR) 5 MG tablet, Take 5 mg by mouth daily., Disp: , Rfl:    hydrocortisone 2.5 % cream, apply topically 2 times daily as needed for itch at right lower leg, Disp: 30 g, Rfl: 5   ketoconazole (NIZORAL) 2 % shampoo, MASSAGE INTO SCALP AND LET SIT SEVERAL  MINUTES BEFORE RINSING. USE 2-3 TIMES A WEEK., Disp: 120 mL, Rfl: 2   Lancet Device MISC, Use as directed to check blood sugars, Disp: 1 each, Rfl: 0   Lancets (ONETOUCH DELICA PLUS LANCET30G) MISC, USE AS INSTRUCTED TO CHECK BLOOD SUGARS ONCE DAILY. DX E 11.9, Disp: 100 each, Rfl: 12   Omega-3 Fatty Acids (FISH OIL) 1200 MG CAPS, Take 1 capsule by mouth daily., Disp: , Rfl:    Probiotic, Lactobacillus, CAPS, Take 1 capsule by mouth daily., Disp: , Rfl:    rosuvastatin (CRESTOR) 40 MG tablet, TAKE 1 TABLET BY  MOUTH EVERY DAY, Disp: 90 tablet, Rfl: 1   traZODone (DESYREL) 50 MG tablet, TAKE 1/2 TO 1 TABLET BY MOUTH AT BEDTIME AS NEEDED FOR SLEEP (Patient taking differently: Take 50 mg by mouth at bedtime as needed for sleep. for sleep), Disp: 90 tablet, Rfl: 1   ferrous sulfate 325 (65 FE) MG EC tablet, Take 325 mg by mouth every other day. Every other day, Disp: , Rfl:    guaiFENesin-dextromethorphan (ROBITUSSIN DM) 100-10 MG/5ML syrup, Take 5 mLs by mouth every 4 (four) hours as needed for cough. (Patient not taking: Reported on 10/30/2023), Disp: 118 mL, Rfl: 0   triamcinolone cream (KENALOG) 0.1 %, Apply 1 Application topically 2 (two) times daily. On affected area on leg.  Use for 7-10 days only - then stop. (Patient not taking: Reported on 10/30/2023), Disp: 30 g, Rfl: 0 Labs and Diagnostic Imaging   CBC:  Recent Labs  Lab 10/29/23 2303 10/31/23 0425  WBC 6.3 9.0  NEUTROABS 3.8  --   HGB 12.0 12.6  HCT 36.5 38.7  MCV 102.5* 100.5*  PLT 170 217    Basic Metabolic Panel:  Lab Results  Component Value Date   NA 136 10/31/2023   K 4.1 10/31/2023   CO2 21 (L) 10/31/2023   GLUCOSE 111 (H) 10/31/2023   BUN 30 (H) 10/31/2023   CREATININE 1.31 (H) 10/31/2023   CALCIUM 8.9 10/31/2023   GFRNONAA 39 (L) 10/31/2023   GFRAA 46 (L) 07/12/2020   Lipid Panel:  Lab Results  Component Value Date   LDLCALC 46 10/29/2023   HgbA1c:  Lab Results  Component Value Date   HGBA1C 4.9 07/08/2023   INR  Lab Results  Component Value Date   INR 1.2 02/28/2023   APTT  Lab Results  Component Value Date   APTT 22 (L) 02/28/2023   CT Head without contrast(Personally reviewed): No acute intracranial abnormality  CT angio head/neck IMPRESSION: 1. No hemorrhage or CT evidence of an acute cortical infarct. 2. No intracranial large vessel occlusion. 3. Approximately 60% stenosis of the origin of the left ICA secondary to soft atherosclerotic plaque. 4. Moderate to severe stenosis of the origin  of the left vertebral artery secondary to calcified atherosclerotic plaque. 5. Somewhat beaded and irregular appearance of the distal left common carotid artery and proximal left ICA, as can be seen in the setting of FMD.   MRI Brain(Personally reviewed): Extremely motion degraded but still positive for acute infarct in the left thalamus, capsular region as well as periatrial and medial temporal lobes-thalamic perforator versus anterior choroidal artery territory infarctions.  Echo - pending    Assessment   TWINKLE OPPERMANN is a 89 y.o. 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  On examination has right sided leg Right homonymous hemianopsia along with part of questioning and word finding difficulty.  MRI brain with left thalamic capsular as well as left periatrial and medial temporal infarct possibly from thalamic perforators versus anterior choroidal territory infarction. CT head unremarkable CT angiography head and neck with possible 50 to 60% stenosis of the origin of the left ICA secondary to soft atherosclerotic plaque. Moderate stenosis of the origin of the left vertebral artery. She also has some beaded appearance of her left common carotid as can be seen with FMD. That said, her stroke etiology is likely cardioembolic from her nonanticoagulated atrial fibrillation. I discussed in detail the risks and benefits of anticoagulation given her GI bleed status.  The primary team has discussed this with GI as well as heme/oncology specialist who have been taking care of her and they are all okay with starting anticoagulation I  would recommend low-dose Eliquis given her age greater than 80 and weight limit close to the 60 kg mark as well as creatinine also close to the 1.5 mark.   A1c at goal in August LDL at goal   Impression: Acute ischemic stroke secondary to cardioembolic source from atrial fibrillation  Recommendations  Echo pending-will review and update if there are any concerning findings Continue home statin Start Eliquis 2.5 mg twice daily (as said above-will do low-dose due to advanced age, renal derangement and weight being very close to the limit where low doses recommended especially in a patient who has hide tendency of GI bleeds) Discontinue aspirin Repeat H&H in 1 week. Follow-up with PCP in 1 week and GI as well hematology/oncology in 1 to 2 weeks. Therapy assessments follow-up per primary team Consider cardiology referral for Watchman device if she has trouble with anticoagulation-but at her age I am not sure if that would be feasible-will defer to outpatient evaluation. Follow-up with outpatient neurology in 8 to 12 weeks  Plan was relayed to Dr. Lucianne Muss Plan was discussed with patient's daughter at bedside Plan was also discussed with pharmacist.  Inpatient neurology will be available with questions-please do not hesitate to call as needed  -- Signed, Milon Dikes, MD [facility]

## 2023-10-31 NOTE — Evaluation (Signed)
Physical Therapy Evaluation Patient Details Name: Jocelyn Elliott MRN: [id number] DOB: 1932/03/01 Today's Date: 10/31/2023  History of Present Illness  89 y.o. female who presents to the ED with confusion, difficulty with speech, unsteady gait, blurred vision in her right eye and pain in her right arm and leg. MRI head findings: acute infarct in the left thalamus capsular, periatrial, and medial temporal areas with PMHx: A-fib not on anticoagulation, chronic IDA followed by oncology, CKD 3, CAD, prior TIA s/p bilateral carotid endarterectomy(recent duplex 08/2023 with1-39% RICA and 40-50% LICA restenosis)   Clinical Impression  Patient received in recliner eating breakfast. Daughter, [name] and other family member present in room. Patient is pleasant and agreeable to PT assessment. Patient reports sensation on right side is normal. Decreased grip in R UE, and strength R LE 5/5 grossly. She is able to stand from recliner with cues and cga. Patient is slightly impulsive initially with mobility. Requires cues and min A to avoid obstacles in room ( door frame) with RW on her right side. Patient has decreased step length and awareness of R LE occasionally kicking RW or getting R LE outside of RW, especially with turning. Patient was able to ambulate ~100 feet with RW with min A/Cga. Poor vision on right also limiting safe mobility.  Patient will benefit from continued skilled PT to improve safety and independence. She has great potential to make quick progress and is motivated.          If plan is discharge home, recommend the following: A little help with walking and/or transfers;A little help with bathing/dressing/bathroom;Assist for transportation;Help with stairs or ramp for entrance;Assistance with cooking/housework;Direct supervision/assist for financial management;Direct supervision/assist for medications management   Can travel by private vehicle    yes    Equipment Recommendations None  recommended by PT  Recommendations for Other Services       Functional Status Assessment Patient has had a recent decline in their functional status and demonstrates the ability to make significant improvements in function in a reasonable and predictable amount of time.     Precautions / Restrictions Precautions Precautions: Fall Restrictions Weight Bearing Restrictions Per Provider Order: No      Mobility  Bed Mobility               General bed mobility comments: NT up in recliner pre/post session    Transfers Overall transfer level: Needs assistance Equipment used: Rolling walker (2 wheels) Transfers: Sit to/from Stand Sit to Stand: Contact guard assist           General transfer comment: Cga for sit to stand with cues for hand placement, safety.    Ambulation/Gait Ambulation/Gait assistance: Min assist, Contact guard assist Gait Distance (Feet): 100 Feet Assistive device: Rolling walker (2 wheels) Gait Pattern/deviations: Step-through pattern, Decreased step length - right Gait velocity: decr     General Gait Details: patient requires cga and cues for safety wtih ambulation due to decreased awareness on right side and poor vision on right. She tends to get outside RW on right especially with turning. Requires cues and assist for obstacle avoidance on her right side.  Stairs            Wheelchair Mobility     Tilt Bed    Modified Rankin (Stroke Patients Only)       Balance Overall balance assessment: Needs assistance Sitting-balance support: Feet supported Sitting balance-Leahy Scale: Good     Standing balance support: Bilateral upper extremity supported, During functional  activity, Reliant on assistive device for balance Standing balance-Leahy Scale: Fair Standing balance comment: patient requires assistance and RW for safety with mobility                             Pertinent Vitals/Pain Pain Assessment Pain Assessment:  No/denies pain    Home Living Family/patient expects to be discharged to:: Inpatient rehab Living Arrangements: Alone Available Help at Discharge: Family;Available 24 hours/day (daughter is CNA and can stay with patient 24/7 as needed.) Type of Home: House         Home Layout: One level Home Equipment: Cane - single point;Rollator (4 wheels);Rolling Walker (2 wheels);Shower seat;Tub bench;BSC/3in1;Lift chair      Prior Function Prior Level of Function : Independent/Modified Independent             Mobility Comments: SPC or rollator for community mobility; RW or no AD in the home; not driving ADLs Comments: IND with ADLs, can prepare coffee, oatmeal, daughter cooks higher level meals     Extremity/Trunk Assessment   Upper Extremity Assessment Upper Extremity Assessment: Defer to OT evaluation RUE Deficits / Details: full ROM, sensation; slower FMC on R side; 3+/5 strength in RUE RUE Sensation: WNL RUE Coordination: decreased fine motor    Lower Extremity Assessment Lower Extremity Assessment: RLE deficits/detail RLE Deficits / Details: RLE weakness-slower on R side during mobility, decreased attention to right side. Cues and assist needed for patient to keep within RW RLE Sensation: WNL    Cervical / Trunk Assessment Cervical / Trunk Assessment: Normal  Communication   Communication Communication: No apparent difficulties Cueing Techniques: Verbal cues;Tactile cues  Cognition Arousal: Alert Behavior During Therapy: WFL for tasks assessed/performed Overall Cognitive Status: Within Functional Limits for tasks assessed Area of Impairment: Safety/judgement, Awareness, Problem solving                         Safety/Judgement: Decreased awareness of deficits, Decreased awareness of safety Awareness: Emergent Problem Solving: Requires verbal cues, Requires tactile cues          General Comments      Exercises     Assessment/Plan    PT Assessment  Patient needs continued PT services  PT Problem List Decreased strength;Decreased mobility;Decreased safety awareness;Decreased coordination       PT Treatment Interventions DME instruction;Gait training;Stair training;Functional mobility training;Therapeutic activities;Therapeutic exercise;Balance training;Neuromuscular re-education;Cognitive remediation;Patient/family education    PT Goals (Current goals can be found in the Care Plan section)  Acute Rehab PT Goals Patient Stated Goal: to regain independence PT Goal Formulation: With patient/family Time For Goal Achievement: 11/14/23 Potential to Achieve Goals: Good    Frequency Min 1X/week     Co-evaluation               AM-PAC PT "6 Clicks" Mobility  Outcome Measure Help needed turning from your back to your side while in a flat bed without using bedrails?: A Little Help needed moving from lying on your back to sitting on the side of a flat bed without using bedrails?: A Little Help needed moving to and from a bed to a chair (including a wheelchair)?: A Little Help needed standing up from a chair using your arms (e.g., wheelchair or bedside chair)?: A Little Help needed to walk in hospital room?: A Little Help needed climbing 3-5 steps with a railing? : A Lot 6 Click Score: 17    End of  Session Equipment Utilized During Treatment: Gait belt Activity Tolerance: Patient tolerated treatment well Patient left: in chair;with call bell/phone within reach;with family/visitor present Nurse Communication: Mobility status PT Visit Diagnosis: Other abnormalities of gait and mobility (R26.89);Difficulty in walking, not elsewhere classified (R26.2)    Time: 1105-1130 PT Time Calculation (min) (ACUTE ONLY): 25 min   Charges:   PT Evaluation $PT Eval Moderate Complexity: 1 Mod PT Treatments $Gait Training: 8-22 mins PT General Charges $$ ACUTE PT VISIT: 1 Visit         Gannon Heinzman, PT, GCS 10/31/23,12:00 PM

## 2023-10-31 NOTE — ED Notes (Signed)
Pt denies needs at this time. Family at bedside.  

## 2023-10-31 NOTE — Progress Notes (Signed)
SLP Cancellation Note  Patient Details Name: Jocelyn Elliott MRN: [id number] DOB: [birth year]   Cancelled treatment:       Reason Eval/Treat Not Completed: Patient at procedure or test/unavailable (Pt OTF.)  Clyde Canterbury, M.S., CCC-SLP Speech-Language Pathologist [facility] [phone] Arnette Felts)  [name] 10/31/2023, 2:13 PM

## 2023-10-31 NOTE — Progress Notes (Signed)
Inpatient Rehabilitation Admissions Coordinator   I await PT eval to assist with planning dispo needs.  Ottie Glazier, RN, MSN Rehab Admissions Coordinator [phone] 10/31/2023 11:39 AM

## 2023-10-31 NOTE — TOC Initial Note (Signed)
Transition of Care [facility]) - Initial/Assessment Note    Patient Details  Name: Jocelyn Elliott MRN: [id number] Date of Birth: [birth year]  [facility]) CM/SW Contact:    Allena Katz, LCSW Phone Number: 10/31/2023, 3:21 PM  Clinical Narrative:      Pt admitted from home with a CVA. Pt is being evaluated for CIR and has 24/7 support from daughter. TOC will continue to follow.             Barriers to Discharge: Continued Medical Work up   Patient Goals and CMS Choice Patient states their goals for this hospitalization and ongoing recovery are:: CIR          Expected Discharge Plan and Services                                              Prior Living Arrangements/Services     Patient language and need for interpreter reviewed:: Yes Do you feel safe going back to the place where you live?: Yes      Need for Family Participation in Patient Care: Yes (Comment) Care giver support system in place?: Yes (comment)   Criminal Activity/Legal Involvement Pertinent to Current Situation/Hospitalization: No - Comment as needed  Activities of Daily Living   ADL Screening (condition at time of admission) Independently performs ADLs?: No Does the patient have a NEW difficulty with bathing/dressing/toileting/self-feeding that is expected to last >3 days?: Yes (Initiates electronic notice to provider for possible OT consult) Does the patient have a NEW difficulty with getting in/out of bed, walking, or climbing stairs that is expected to last >3 days?: Yes (Initiates electronic notice to provider for possible PT consult) Does the patient have a NEW difficulty with communication that is expected to last >3 days?: No Is the patient deaf or have difficulty hearing?: No Does the patient have difficulty seeing, even when wearing glasses/contacts?: No Does the patient have difficulty concentrating, remembering, or making decisions?: No  Permission Sought/Granted                   Emotional Assessment       Orientation: : Oriented to Self, Oriented to Place, Oriented to  Time, Oriented to Situation Alcohol / Substance Use: Not Applicable    Admission diagnosis:  Slurred speech [R47.81] Confusion [R41.0] Paroxysmal atrial fibrillation (HCC) [I48.0] CVA (cerebral vascular accident) [facility]) [I63.9] Patient Active Problem List   Diagnosis Date Noted   CVA (cerebral vascular accident) (HCC) 10/30/2023   Stage 3a chronic kidney disease (HCC) 10/30/2023   CAP (community acquired pneumonia) 03/01/2023   SOB (shortness of breath) 02/28/2023   Ear pain, left 04/11/2021   COVID-19 virus infection 04/11/2021   Aortic atherosclerosis (HCC) 04/10/2021   Thrombocytopenia (HCC) 04/10/2021   Fall 04/10/2021   Difficulty with speech 02/12/2021   Peripheral edema 01/02/2021   Anemia of chronic kidney failure, stage 3 (moderate) (HCC) 07/20/2019   Chest pain 04/17/2019   Bradycardia 02/02/2019   Paroxysmal atrial fibrillation (HCC) 02/02/2019   Stage 3b chronic kidney disease (HCC) 10/18/2018   SI (sacroiliac) joint dysfunction 01/07/2018   Dizziness 05/22/2017   History of total left hip replacement 05/17/2017   Trochanteric bursitis of left hip 05/17/2017   IDA (iron deficiency anemia) 09/20/2016   Hypotension 04/06/2016   Other specified respiratory disorders 04/06/2016   Health care maintenance 10/28/2015  2-vessel coronary artery disease 09/22/2015   SOB (shortness of breath) on exertion 09/20/2015   Knee pain, bilateral 09/20/2015   Sleeping difficulty 07/24/2015   Hepatomegaly 07/24/2015   Sleep disorder 07/24/2015   Hepatomegaly, not elsewhere classified 07/24/2015   Generalized anxiety disorder 04/23/2015   Hip pain 10/11/2013   Pain in hip 10/11/2013   Tendonitis 07/19/2013   Leg numbness 04/07/2013   CAD (coronary artery disease) 09/05/2012   Hypertension 09/05/2012   Hypercholesteremia 09/05/2012   Diabetes mellitus (HCC)  09/05/2012   Carotid arterial disease (HCC) 09/05/2012   Normocytic anemia 09/05/2012   Disorder of artery or arteriole (HCC) 09/05/2012   Essential (primary) hypertension 09/05/2012   Anemia 09/05/2012   PCP:  Dale Leavenworth, MD Pharmacy:   [facility] [phone] - [place], [place] - [address] [place] [address] Phone: [phone] Fax: [phone]  [facility] - [place], [place] - [address] [place] [address] Phone: [phone] Fax: [phone]     Social Drivers of Health (SDOH) Social History: SDOH Screenings   Food Insecurity: No Food Insecurity (10/31/2023)  Housing: Low Risk  (10/31/2023)  Transportation Needs: No Transportation Needs (10/31/2023)  Utilities: Not At Risk (10/31/2023)  Alcohol Screen: Low Risk  (04/02/2023)  Depression (PHQ2-9): Low Risk  (04/02/2023)  Financial Resource Strain: Low Risk  (04/02/2023)  Physical Activity: Insufficiently Active (04/02/2023)  Social Connections: Socially Integrated (04/02/2023)  Stress: No Stress Concern Present (04/02/2023)  Tobacco Use: Medium Risk (10/29/2023)   SDOH Interventions:     Readmission Risk Interventions    10/30/2023   11:54 AM  Readmission Risk Prevention Plan  Post Dischage Appt Complete  Medication Screening Complete  Transportation Screening Complete

## 2023-10-31 NOTE — Progress Notes (Addendum)
[facility] Progress Note  Patient: Jocelyn Elliott    QQV:[id number]  DOA: 10/29/2023     Date of Service: the patient was seen and examined on 10/31/2023  No chief complaint on file.  Brief hospital course: SAKEENA WINGET is a 89 y.o. female with medical history significant for Prior history of A-fib not on anticoagulation, chronic IDA followed by oncology, CKD 3, CAD, prior TIA s/p bilateral carotid endarterectomy(recent duplex 08/2023 with1-39% RICA and 40-50% LICA restenosis) who presents to the ED with confusion, difficulty with speech, unsteady gait, blurred vision in her right eye and pain in her right arm and leg.  History is provided by her daughter at the bedside.  She states patient lives alone and takes care of her bills and is fully independent at baseline.  She states the day prior she went to have an eye exam and had eye dilatation and was in her usual state of health.  On the day of arrival however she awoke and " did not seem right" because of the aforementioned symptoms. ED course and data review: Vitals within normal limits Pertinent workup includes the following: CBC unremarkable, BMP notable for baseline creatinine of 1.09 Troponin 26 EKG, personally viewed and interpreted showing A-fib at 86 with no ischemic ST-T wave changes CT head nonacute but shows small old left cerebellar infarct and encephalomalacia Patient was given chewable aspirin 325 Hospitalist consulted for admission for stroke workup.     Assessment and Plan:  # CVA (cerebral vascular accident) # History of TIA # History of bilateral carotid endarterectomy Patient with strokelike symptoms presenting outside tPA window, initial CT negative Permissive hypertension for first 24-48 hrs post stroke onset: Prn Labetalol IV or Vasotec IV If BP greater than 220/120  Statins for LDL goal less than 70.  TSH 2.1 within normal range S/p ASA 81mg  daily, 12/12 started Eliquis 2.5 mg p.o. twice  daily Telemetry, echo, MRI/MRA---> MRI positive for stroke pending official read.  Patient was unable to tolerate lying flat to get MRI Avoid dextrose containing fluids, Maintain euglycemia, euthermia Neuro checks q4 hrs x 24 hrs and then per shift Head of bed 30 degrees Physical therapy/Occupational therapy/Speech therapy if failed dysphagia screen Neurology consulted, rec DOAC aftr clearance from GI and Heme/onc D/w GI and oncologist, recommended to start DOAC and monitor H&H and transfuse as needed 12/11 discussed with patient's daughter regarding DOACs, she stated that patient is having dark stools all the time due to AVM and high risk for bleeding.  Patient is to follow with a ophthalmologist as an outpatient for prism glasses 12/12 discussed with patient and family, started Eliquis 2.5 mg p.o. twice daily.  Risk and benefit explained. Monitor H&H  # Paroxysmal atrial fibrillation Prior history of A-fib not on anticoagulation due to AVM, GI bleed, still having dark black stools and iron deficiency, getting IV iron infusions. Currently in A-fib with controlled rate 12/12 started Eliquis 2.5 mg p.o. twice daily Patient was advised to follow-up with cardiology for evaluation of Watchman device if unable to tolerate DOAC  # stage 3a chronic kidney disease Renal function at baseline   # IDA (iron deficiency anemia) Receives Retacrit infusions with oncology   # Diabetes mellitus (HCC) Sliding scale insulin coverage   # CAD (coronary artery disease) Continue aspirin and rosuvastatin  # Vitamin D deficiency: started vitamin D 50,000 units p.o. weekly, follow with PCP to repeat vitamin D level after 3 to 6 months.   Body mass index is  27.87 kg/m.  Interventions:  Diet: Heart healthy and carb modified diet DVT Prophylaxis: Subcutaneous Lovenox   Advance goals of care discussion: DNR-limited  Family Communication: family was present at bedside, at the time of interview.  The pt  provided permission to discuss medical plan with the family. Opportunity was given to ask question and all questions were answered satisfactorily.   Disposition:  Pt is from home, admitted with acute CVA, history of A-fib, remains at high risk for GI bleed, which precludes a safe discharge. PT/OT eval done recommended inpatient rehab. Discharge to CIR, when bed will be available.  TOC following for disposition plan.    Subjective: No significant events overnight, patient still has peripheral vision loss in the right eye.  Right-sided weakness is improving.  Denies any new neurological complaints.   Physical Exam: General: NAD, lying comfortably Appear in no distress, affect appropriate Eyes: PERRLA ENT: Oral Mucosa Clear, moist  Neck: no JVD,  Cardiovascular: Irregular rhythm, no Murmur,  Respiratory: good respiratory effort, Bilateral Air entry equal and Decreased, no Crackles, no wheezes Abdomen: Bowel Sound present, Soft and no tenderness,  Skin: no rashes Extremities: 1-2+ pedal edema R>L, no calf tenderness Neurologic: Right-sided weakness power 4/5 Gait not checked due to patient safety concerns  Vitals:   10/31/23 0930 10/31/23 1000 10/31/23 1330 10/31/23 1430  BP: 110/62 (!) 104/58 104/75 124/60  Pulse: 80 69 91 85  Resp: 14 14 20 19   Temp:    97.8 F (36.6 C)  TempSrc:    Oral  SpO2: 97% 96% 96% 96%  Weight:      Height:       No intake or output data in the 24 hours ending 10/31/23 1533 Filed Weights   10/29/23 2301  Weight: 62.6 kg    Data Reviewed: I have personally reviewed and interpreted daily labs, tele strips, imagings as discussed above. I reviewed all nursing notes, pharmacy notes, vitals, pertinent old records I have discussed plan of care as described above with RN and patient/family.  CBC: Recent Labs  Lab 10/29/23 2303 10/31/23 0425  WBC 6.3 9.0  NEUTROABS 3.8  --   HGB 12.0 12.6  HCT 36.5 38.7  MCV 102.5* 100.5*  PLT 170 217   Basic  Metabolic Panel: Recent Labs  Lab 10/29/23 2303 10/31/23 0425  NA 137 136  K 3.8 4.1  CL 108 107  CO2 22 21*  GLUCOSE 99 111*  BUN 24* 30*  CREATININE 1.09* 1.31*  CALCIUM 8.8* 8.9  MG 2.3 2.2  PHOS 3.1 3.0    Studies: No results found.  Scheduled Meds:   stroke: early stages of recovery book   Does not apply Once   apixaban  2.5 mg Oral BID   insulin aspart  0-15 Units Subcutaneous TID WC   insulin aspart  0-5 Units Subcutaneous QHS   rosuvastatin  40 mg Oral Daily   Vitamin D (Ergocalciferol)  50,000 Units Oral Q7 days   Continuous Infusions: PRN Meds: acetaminophen **OR** acetaminophen (TYLENOL) oral liquid 160 mg/5 mL **OR** acetaminophen, morphine injection, traZODone  Time spent: 40 minutes  Author: Gillis Santa. MD [facility] 10/31/2023 3:33 PM  To reach On-call, see care teams to locate the attending and reach out to them via [email or web address]. If 7PM-7AM, please contact night-coverage If you still have difficulty reaching the attending provider, please page the [facility] (Director on Call) for [facility] on amion for assistance.

## 2023-10-31 NOTE — Progress Notes (Signed)
  Inpatient Rehabilitation Admissions Coordinator   I spoke with patient's daughter by phone after patient arrived to room from ED.  We discussed goals and expectations of a possible CIR admit. They prefer CIR for rehab. Daughter lives next door to her Mom and can stay with her 24/7 at home until she recovers. I will discuss case with my Rehab MD to clarify need for AIR level rehab. I will follow up in the am. Please call me with any questions.   Ottie Glazier, RN, MSN Rehab Admissions Coordinator [phone]

## 2023-10-31 NOTE — ED Notes (Signed)
Pt ambulated with assistance and walker to toilet in the room. Pt daughter at bedside.

## 2023-10-31 NOTE — ED Notes (Signed)
Pt to be transported to the floor a this time by Council Mechanic, EMT-P.

## 2023-11-01 DIAGNOSIS — I63412 Cerebral infarction due to embolism of left middle cerebral artery: Secondary | ICD-10-CM | POA: Diagnosis not present

## 2023-11-01 LAB — GLUCOSE, CAPILLARY
Glucose-Capillary: 223 mg/dL — ABNORMAL HIGH (ref 70–99)
Glucose-Capillary: 71 mg/dL (ref 70–99)
Glucose-Capillary: 79 mg/dL (ref 70–99)
Glucose-Capillary: 113 mg/dL — ABNORMAL HIGH (ref 70–99)

## 2023-11-01 LAB — PHOSPHORUS: Phosphorus: 2.8 mg/dL (ref 2.5–4.6)

## 2023-11-01 LAB — CBC
HCT: 35.6 % — ABNORMAL LOW (ref 36.0–46.0)
Hemoglobin: 11.6 g/dL — ABNORMAL LOW (ref 12.0–15.0)
MCH: 32.7 pg (ref 26.0–34.0)
MCHC: 32.6 g/dL (ref 30.0–36.0)
MCV: 100.3 fL — ABNORMAL HIGH (ref 80.0–100.0)
Platelets: 173 10*3/uL (ref 150–400)
RBC: 3.55 MIL/uL — ABNORMAL LOW (ref 3.87–5.11)
RDW: 13.8 % (ref 11.5–15.5)
WBC: 6.1 10*3/uL (ref 4.0–10.5)
nRBC: 0 % (ref 0.0–0.2)

## 2023-11-01 LAB — BASIC METABOLIC PANEL
Anion gap: 8 (ref 5–15)
BUN: 27 mg/dL — ABNORMAL HIGH (ref 8–23)
CO2: 22 mmol/L (ref 22–32)
Calcium: 8.8 mg/dL — ABNORMAL LOW (ref 8.9–10.3)
Chloride: 106 mmol/L (ref 98–111)
Creatinine, Ser: 1.31 mg/dL — ABNORMAL HIGH (ref 0.44–1.00)
GFR, Estimated: 39 mL/min — ABNORMAL LOW (ref >60)
Glucose, Bld: 226 mg/dL — ABNORMAL HIGH (ref 70–99)
Potassium: 3.9 mmol/L (ref 3.5–5.1)
Sodium: 136 mmol/L (ref 135–145)

## 2023-11-01 LAB — MAGNESIUM: Magnesium: 2.2 mg/dL (ref 1.7–2.4)

## 2023-11-01 MED ORDER — MIDODRINE HCL 5 MG PO TABS
5.0000 mg | ORAL_TABLET | Freq: Three times a day (TID) | ORAL | Status: DC
  Administered 2023-11-01 – 2023-11-03 (×7): 5 mg via ORAL
  Filled 2023-11-01 (×7): qty 1

## 2023-11-01 NOTE — Progress Notes (Signed)
Inpatient Rehabilitation Admissions Coordinator   Patient has progressed to CGA, supervision level. Per family request, I will begin Auth with [facility] medicare for possible Cir admit. Unlikely to hear from insurance before Tuesday. I continue to feel that she will likely progress to be able to go home with [facility] or OP therapy and I have discussed with daughter. Therapy is aware. We will follow up next week if patient remains In house.  Ottie Glazier, RN, MSN Rehab Admissions Coordinator [phone] 11/01/2023 3:41 PM

## 2023-11-01 NOTE — PMR Pre-admission (Shared)
PMR Admission Coordinator Pre-Admission Assessment  Patient: Jocelyn Elliott is an 89 y.o., female MRN: [id number] DOB: 1931-12-15 Height: 4\' 11"  (149.9 cm) Weight: 62.6 kg  Insurance Information HMO: yes    PPO:      PCP:      IPA:      80/20:      OTHER:  PRIMARY: [employer] Medicare      Policy#: [id number]   ; Medicare # [id number]   Subscriber: pt CM Name: ***      Phone#: [phone] option 3     Fax#: [phone] Pre-Cert#: [id number]      Employer:  Benefits:  Phone #: [phone]     Name: 12/13 Eff. Date: 11/19/22     Deduct: $190      Out of Pocket Max: $3090      Life Max: none CIR: $200 co pay per day days 1 until 5      SNF: n$ 20 per day max days 1 until 20; $203 co pay per day days 21 until 100 Outpatient: 80%     Co-Pay: 20% Home Health: 100%      Co-Pay: none DME: 80%     Co-Pay: 20% Providers: in network  SECONDARY: none      Policy#:      Phone#:   Artist:       Phone#:   The Data processing manager" for patients in Inpatient Rehabilitation Facilities with attached "Privacy Act Statement-Health Care Records" was provided and verbally reviewed with: Patient and Family  Emergency Contact Information Contact Information     Name Relation Home Work Mobile   [place] B Daughter   [phone]   Stann Ore   [phone]      Other Contacts   None on File    Current Medical History  Patient Admitting Diagnosis: CVA  History of Present Illness: 89 year old female with history of afib not on anticoagulation, Chronic IDA followed by oncology, CKD 3, CAD, prior TIA s/p bilateral Carotid endarterectomy who presented on 12/11 to [facility] with confusion, , difficulty with speech, unsteady gait, blurred vision in one eye and pain in her right arm and leg. She is fully independent at baseline. The day prior she had an eye exam and had eye dilation.   MRI imaging Very motion degraded and truncated brain MRI but still  positive for acute infarct in the left thalamus capsular, periatrial, and medial temporal areas thalamoperforator versus anterior choroidal infarction). Neurology consulted. Recommended to start DOACs and monitor H and H and transfuse as needed. Patient with history of dark stools all the time due to AVM and iron infusions and at high risk of bleeding. Started ELiquis. Afib currently rate controlled. Recommended to follow up with cardiology about Watchmen device if unable to tolerate DOAC. Received Retacrit infusion for iron deficiency anemia. SSI for DM. Vitamin D deficiency and started Vit D.    Complete NIHSS TOTAL: 3  Patient's medical record from [facility] has been reviewed by the rehabilitation admission coordinator and physician.  Past Medical History  Past Medical History:  Diagnosis Date   Arthritis    AV malformation of gastrointestinal tract    Basal cell carcinoma 08/07/2023   Central upper forehead. Nodular pattern. Mohs 09/09/23   Blood in stool    CAD (coronary artery disease)    Carotid arterial disease (HCC)    Cataracts, bilateral    Chronic blood loss anemia    Chronic cystitis  CKD (chronic kidney disease), stage III (HCC)    Complication of anesthesia    reaction to propofol - caused heart to pause   Depression    Diabetes mellitus (HCC)    GERD (gastroesophageal reflux disease)    Hyperlipidemia    Hypertension    IDA (iron deficiency anemia)    Stress incontinence    TIA (transient ischemic attack) 03/2021   No deficits   Wears dentures    full upper and lower   Has the patient had major surgery during 100 days prior to admission? No  Family History   family history includes Arthritis in an other family member; Cervical cancer in her sister; Diabetes in an other family member; Heart disease in her father; Hyperlipidemia in her father; Hypertension in her father; Rectal cancer in her sister.  Current Medications  Current Facility-Administered Medications:     acetaminophen (TYLENOL) tablet 650 mg, 650 mg, Oral, Q4H PRN, 650 mg at 11/01/23 0528 **OR** acetaminophen (TYLENOL) 160 MG/5ML solution 650 mg, 650 mg, Per Tube, Q4H PRN **OR** acetaminophen (TYLENOL) suppository 650 mg, 650 mg, Rectal, Q4H PRN, Andris Baumann, MD   apixaban Everlene Balls) tablet 2.5 mg, 2.5 mg, Oral, BID, Milon Dikes, MD, 2.5 mg at 11/01/23 0935   insulin aspart (novoLOG) injection 0-15 Units, 0-15 Units, Subcutaneous, TID WC, Andris Baumann, MD, 5 Units at 11/01/23 0935   insulin aspart (novoLOG) injection 0-5 Units, 0-5 Units, Subcutaneous, QHS, Andris Baumann, MD, 2 Units at 10/31/23 2113   midodrine (PROAMATINE) tablet 5 mg, 5 mg, Oral, TID WC, Gillis Santa, MD, 5 mg at 11/01/23 1300   morphine (PF) 2 MG/ML injection 1 mg, 1 mg, Intravenous, Q2H PRN, Lindajo Royal V, MD, 1 mg at 10/30/23 0330   rosuvastatin (CRESTOR) tablet 40 mg, 40 mg, Oral, Daily, Lindajo Royal V, MD, 40 mg at 11/01/23 0935   traZODone (DESYREL) tablet 25-50 mg, 25-50 mg, Oral, QHS PRN, Andris Baumann, MD   Vitamin D (Ergocalciferol) (DRISDOL) 1.25 MG (50000 UNIT) capsule 50,000 Units, 50,000 Units, Oral, Q7 days, Gillis Santa, MD, 50,000 Units at 10/31/23 1008  Patients Current Diet:  Diet Order             Diet heart healthy/carb modified Room service appropriate? Yes; Fluid consistency: Thin  Diet effective now                  Precautions / Restrictions Precautions Precautions: Fall Restrictions Weight Bearing Restrictions Per Provider Order: No   Has the patient had 2 or more falls or a fall with injury in the past year? No  Prior Activity Level Community (5-7x/wk): no driving, SPC or rollator in community, no AD in home  Prior Functional Level Self Care: Did the patient need help bathing, dressing, using the toilet or eating? Independent  Indoor Mobility: Did the patient need assistance with walking from room to room (with or without device)? Independent  Stairs: Did the  patient need assistance with internal or external stairs (with or without device)? Independent  Functional Cognition: Did the patient need help planning regular tasks such as shopping or remembering to take medications? Independent  Patient Information Are you of Hispanic, Latino/a,or Spanish origin?: A. No, not of Hispanic, Latino/a, or Spanish origin What is your race?: A. White Do you need or want an interpreter to communicate with a doctor or health care staff?: 0. No  Patient's Response To:  Health Literacy and Transportation Is the patient able to respond to  health literacy and transportation needs?: Yes Health Literacy - How often do you need to have someone help you when you read instructions, pamphlets, or other written material from your doctor or pharmacy?: Never In the past 12 months, has lack of transportation kept you from medical appointments or from getting medications?: No In the past 12 months, has lack of transportation kept you from meetings, work, or from getting things needed for daily living?: No  Home Assistive Devices / Equipment Home Equipment: Medical laboratory scientific officer - single point, Rollator (4 wheels), Agricultural consultant (2 wheels), Shower seat, Tub bench, BSC/3in1, Lift chair  Prior Device Use: Indicate devices/aids used by the patient prior to current illness, exacerbation or injury? None of the above  Current Functional Level Cognition  Arousal/Alertness: Awake/alert Overall Cognitive Status: Impaired/Different from baseline Orientation Level: Oriented to person, Oriented to place, Oriented to time, Oriented to situation (disoriented to year) Safety/Judgement: Decreased awareness of safety, Decreased awareness of deficits General Comments: Pt is alert and able to consistently follwo commands however daughter endorses pt not at baseline Attention: Selective Selective Attention: Appears intact Memory: Impaired Memory Impairment: Storage deficit, Retrieval deficit, Decreased short  term memory Decreased Short Term Memory: Verbal complex, Functional complex Awareness: Impaired Awareness Impairment: Anticipatory impairment, Emergent impairment Problem Solving: Impaired Problem Solving Impairment: Verbal complex, Functional complex Executive Function: Self Monitoring, Self Correcting, Reasoning Reasoning: Impaired Reasoning Impairment: Verbal complex, Functional complex Self Monitoring: Impaired Self Monitoring Impairment: Verbal complex, Functional complex Self Correcting: Impaired Self Correcting Impairment: Verbal complex, Functional complex Safety/Judgment: Impaired    Extremity Assessment (includes Sensation/Coordination)  Upper Extremity Assessment: Defer to OT evaluation RUE Deficits / Details: full ROM, sensation; slower FMC on R side; 3+/5 strength in RUE RUE Sensation: WNL RUE Coordination: decreased fine motor  Lower Extremity Assessment: RLE deficits/detail RLE Deficits / Details: RLE weakness-slower on R side during mobility, decreased attention to right side. Cues and assist needed for patient to keep within RW RLE Sensation: WNL    ADLs  Overall ADL's : Needs assistance/impaired Eating/Feeding: Supervision/ safety, Sitting Grooming: Wash/dry face, Brushing hair, Supervision/safety, Cueing for sequencing Upper Body Bathing: Supervision/ safety, Cueing for safety, Standing, Sitting Lower Body Bathing: Minimal assistance, Sit to/from stand, Sitting/lateral leans Upper Body Dressing : Sitting, Contact guard assist Lower Body Dressing: Minimal assistance, Sit to/from stand, Sitting/lateral leans Toilet Transfer: Minimal assistance, Regular Toilet, Rolling walker (2 wheels), Grab bars Toileting- Clothing Manipulation and Hygiene: Contact guard assist, Sit to/from stand    Mobility  General bed mobility comments: Pt was in recliner pre/post session    Transfers  Overall transfer level: Needs assistance Equipment used: Rolling walker (2 wheels), 1  person hand held assist, None (youth) Transfers: Sit to/from Stand Sit to Stand: Supervision General transfer comment: Pt was able to stand form recliner to RW with supervision only. CGA for STS with +1 HHA    Ambulation / Gait / Stairs / Wheelchair Mobility  Ambulation/Gait Ambulation/Gait assistance: Contact guard assist, Supervision Gait Distance (Feet): 200 Feet Assistive device: Rolling walker (2 wheels), 1 person hand held assist (youth RW) Gait Pattern/deviations: Step-through pattern General Gait Details: Pt ambulated ~ 75 ft with youth RW and then ~ 125 ft with HHA +1. pt has R visual field deficits and tends to kick RW with RLE. Overall occasional staggering but no intervention required. Gait velocity: WNL    Posture / Balance Dynamic Sitting Balance Sitting balance - Comments: steady reaching within BOS, however difficulty reaching ankles/feet during bathing Balance Overall balance assessment: Needs  assistance Sitting-balance support: Feet supported Sitting balance-Leahy Scale: Good Sitting balance - Comments: steady reaching within BOS, however difficulty reaching ankles/feet during bathing Postural control: Right lateral lean Standing balance support: Single extremity supported, During functional activity Standing balance-Leahy Scale: Fair Standing balance comment: CGA and RW for safety with mobility    Special needs/care consideration Hgb A1c 4.9   Previous Home Environment  Living Arrangements: Alone  Lives With: Alone Available Help at Discharge:  (dtr lives next door and moves in with her as needed) Type of Home: House Home Layout: One level Bathroom Shower/Tub: Sponge bathes at baseline Bathroom Toilet: Handicapped height Bathroom Accessibility: Yes How Accessible: Accessible via walker Home Care Services: No Additional Comments: daughter lives next door  Discharge Living Setting Plans for Discharge Living Setting: Patient's home, Alone, House Type of Home  at Discharge: House Discharge Home Layout: One level Discharge Bathroom Shower/Tub:  (sponge baths at baseline) Discharge Bathroom Toilet: Handicapped height Discharge Bathroom Accessibility: Yes How Accessible: Accessible via walker Does the patient have any problems obtaining your medications?: No  Social/Family/Support Systems Patient Roles: Parent Contact Information: daughter, [name] Anticipated Caregiver: [name] Anticipated Caregiver's Contact Information: see contacts Ability/Limitations of Caregiver: no limitations Caregiver Availability: 24/7 Discharge Plan Discussed with Primary Caregiver: Yes Is Caregiver In Agreement with Plan?: Yes Does Caregiver/Family have Issues with Lodging/Transportation while Pt is in Rehab?: No  Goals Patient/Family Goal for Rehab: Mod I to supervision with PT, OT and SLP Expected length of stay: ELOS 5 to 7 days Pt/Family Agrees to Admission and willing to participate: Yes Program Orientation Provided & Reviewed with Pt/Caregiver Including Roles  & Responsibilities: Yes  Decrease burden of Care through IP rehab admission: n/a  Possible need for SNF placement upon discharge: not anticipated  Patient Condition: I have reviewed medical records from [facility], spoken with  daughter. I discussed via phone for inpatient rehabilitation assessment.  Patient will benefit from ongoing PT, OT, and SLP, can actively participate in 3 hours of therapy a day 5 days of the week, and can make measurable gains during the admission.  Patient will also benefit from the coordinated team approach during an Inpatient Acute Rehabilitation admission.  The patient will receive intensive therapy as well as Rehabilitation physician, nursing, social worker, and care management interventions.  Due to bladder management, bowel management, safety, skin/wound care, disease management, medication administration, pain management, and patient education the patient requires 24 hour a day  rehabilitation nursing.  The patient is currently *** with mobility and basic ADLs.  Discharge setting and therapy post discharge at home with home health is anticipated.  Patient has agreed to participate in the Acute Inpatient Rehabilitation Program and will admit {Time; today/tomorrow:10263}.  Preadmission Screen Completed By:  Clois Dupes, RN MSN12/13/2024 4:32 PM ______________________________________________________________________   Discussed status with Dr. Marland Kitchen on *** at *** and received approval for admission today.  Admission Coordinator:  Clois Dupes, RN MSN time Marland KitchenDorna Bloom ***   Assessment/Plan: Diagnosis: Does the need for close, 24 hr/day Medical supervision in concert with the patient's rehab needs make it unreasonable for this patient to be served in a less intensive setting? {yes_no_potentially:3041433} Co-Morbidities requiring supervision/potential complications: *** Due to {due ZO:1096045}, does the patient require 24 hr/day rehab nursing? {yes_no_potentially:3041433} Does the patient require coordinated care of a physician, rehab nurse, PT, OT, and SLP to address physical and functional deficits in the context of the above medical diagnosis(es)? {yes_no_potentially:3041433} Addressing deficits in the following areas: {deficits:3041436} Can the patient actively participate  in an intensive therapy program of at least 3 hrs of therapy 5 days a week? {yes_no_potentially:3041433} The potential for patient to make measurable gains while on inpatient rehab is {potential:3041437} Anticipated functional outcomes upon discharge from inpatient rehab: {functional outcomes:304600100} PT, {functional outcomes:304600100} OT, {functional outcomes:304600100} SLP Estimated rehab length of stay to reach the above functional goals is: *** Anticipated discharge destination: {anticipated dc setting:21604} 10. Overall Rehab/Functional Prognosis: {potential:3041437}   MD  Signature: ***

## 2023-11-01 NOTE — Progress Notes (Signed)
Physical Medicine & Rehabilitation Consult Service  Pt discussed with rehab admissions coordinator. Chart has been reviewed. This is a 89 yo female with a history of GI AVM/GIB who suffered infarcts in the left thalamic capsular and left periatrial areas as well as the medial temporal lobe likely d/t atrial fibrillation. She is currently min assist/contact guard assist with mobility and transfers and supervision to min assist with ADL's.     Assessment: Cardioembolic left brain infarcts.  A fib Hx of AVM, GIB   Plan:  If we were able to admit within the next 1-2 days, this patient could benefit from a 5-7 day acute inpatient rehab admission to address mobility, self-care in the setting of her visual-spatial deficits, right hemiparesis, etc. Additionally, the patient still requires daily MD oversight of the active medical issues noted above. Dispo and social supports are appropriate. My concern is, however, that by the time insurance would potentially authorize such an admission, she may be too high level for inpatient rehab.   Rehab Admissions Coordinator to follow up.    Ranelle Oyster, MD, [facility] Physical Medicine & Rehabilitation Medical Director Rehabilitation Services 11/01/2023

## 2023-11-01 NOTE — Progress Notes (Signed)
Physical Therapy Treatment Patient Details Name: TALIKA LORING MRN: [id number] DOB: [birth year] Today's Date: 11/01/2023   History of Present Illness 89 y.o. female who presents to the ED with confusion, difficulty with speech, unsteady gait, blurred vision in her right eye and pain in her right arm and leg. MRI head findings: acute infarct in the left thalamus capsular, periatrial, and medial temporal areas with PMHx: A-fib not on anticoagulation, chronic IDA followed by oncology, CKD 3, CAD, prior TIA s/p bilateral carotid endarterectomy(recent duplex 08/2023 with1-39% RICA and 40-50% LICA restenosis)    PT Comments  Pt was sitting in recliner upon arrival with supportive daughter at bedside. Pt is agreeable to session and remains motivated throughout. Pt easily stood to RW(youth) and ambulated ~ 18ft. Progressed to ambulation with +1 HHA with pt tolerating gait ~ 125 ft however fatigue is noted. Vitals remained stable. Pt does present with some R side neglect and several times was cued for awareness. Per daughter, pt is not at her baseline. Both, pt and daughter, hopeful for CIR level of care. Acute PT will continue to follow and progress per current POC.          Equipment Recommendations  Other (comment) (Defer to next level of acre)       Precautions / Restrictions Precautions Precautions: Fall Restrictions Weight Bearing Restrictions Per Provider Order: No     Mobility  Bed Mobility    General bed mobility comments: Pt was in recliner pre/post session    Transfers Overall transfer level: Needs assistance Equipment used: Rolling walker (2 wheels), 1 person hand held assist, None (youth) Transfers: Sit to/from Stand Sit to Stand: Supervision  General transfer comment: Pt was able to stand form recliner to RW with supervision only. CGA for STS with +1 HHA    Ambulation/Gait Ambulation/Gait assistance: Contact guard assist, Supervision Gait Distance (Feet): 200  Feet Assistive device: Rolling walker (2 wheels), 1 person hand held assist (youth RW) Gait Pattern/deviations: Step-through pattern Gait velocity: WNL  General Gait Details: Pt ambulated ~ 75 ft with youth RW and then ~ 125 ft with HHA +1. pt has R visual field deficits and tends to kick RW with RLE. Overall occasional staggering but no intervention required.    Balance Overall balance assessment: Needs assistance Sitting-balance support: Feet supported Sitting balance-Leahy Scale: Good     Standing balance support: Single extremity supported, During functional activity Standing balance-Leahy Scale: Fair      Cognition Arousal: Alert Behavior During Therapy: WFL for tasks assessed/performed Overall Cognitive Status: Impaired/Different from baseline Area of Impairment: Safety/judgement, Awareness, Problem solving    Orientation Level: Disoriented to, Time, Situation       Safety/Judgement: Decreased awareness of safety, Decreased awareness of deficits     General Comments: Pt is alert and able to consistently follwo commands however daughter endorses pt not at baseline           General Comments General comments (skin integrity, edema, etc.): Pt tolerated session well but does fatigue quickly. per daughter in room, pt does not use AD in her house but does use RW whern out in community. Pt does present with some balance and R side neglect      Pertinent Vitals/Pain Pain Assessment Pain Assessment: No/denies pain    Home Living     Available Help at Discharge: Family;Available 24 hours/day Type of Home: House          PT Goals (current goals can now be found in the care  plan section) Acute Rehab PT Goals Patient Stated Goal: to regain independence Progress towards PT goals: Progressing toward goals    Frequency    Min 1X/week       AM-PAC PT "6 Clicks" Mobility   Outcome Measure  Help needed turning from your back to your side while in a flat bed  without using bedrails?: A Little Help needed moving from lying on your back to sitting on the side of a flat bed without using bedrails?: A Little Help needed moving to and from a bed to a chair (including a wheelchair)?: A Little Help needed standing up from a chair using your arms (e.g., wheelchair or bedside chair)?: A Little Help needed to walk in hospital room?: A Little Help needed climbing 3-5 steps with a railing? : A Little 6 Click Score: 18    End of Session   Activity Tolerance: Patient tolerated treatment well;Patient limited by fatigue Patient left: in chair;with call bell/phone within reach;with family/visitor present Nurse Communication: Mobility status PT Visit Diagnosis: Other abnormalities of gait and mobility (R26.89);Difficulty in walking, not elsewhere classified (R26.2)     Time: 1432-1450 PT Time Calculation (min) (ACUTE ONLY): 18 min  Charges:    $Gait Training: 8-22 mins PT General Charges $$ ACUTE PT VISIT: 1 Visit                     Jetta Lout PTA 11/01/23, 3:30 PM

## 2023-11-01 NOTE — Progress Notes (Signed)
I checked on patient-currently admitted to hospital for acute CVA.  Accompanied by her daughter [name].  Agree with current management of low-dose Eliquis in the context of acute CVA/history of A-fib.  Monitor closely for anemia-iron deficiency/CKD.  Patient currently awaiting discharge to rehab.

## 2023-11-01 NOTE — Care Management Important Message (Signed)
Important Message  Patient Details  Name: Jocelyn Elliott MRN: [id number] Date of Birth: April 21, 1932   Important Message Given:  N/A - LOS <3 / Initial given by admissions     Olegario Messier A Amory Simonetti 11/01/2023, 7:28 AM

## 2023-11-01 NOTE — Plan of Care (Signed)
Echo completed and reviewed. No new recs. Report below. Please see recs in the note from yesterday. Please reach out with questions as needed.  IMPRESSIONS   1. Left ventricular ejection fraction, by estimation, is 55 to 60%. The  left ventricle has normal function. The left ventricle has no regional  wall motion abnormalities. Left ventricular diastolic parameters are  consistent with Grade III diastolic  dysfunction (restrictive).   2. Right ventricular systolic function is normal. The right ventricular  size is normal.   3. Left atrial size was moderately dilated.   4. Right atrial size was mild to moderately dilated.   5. The mitral valve is abnormal. Moderate mitral valve regurgitation.  Moderate to severe mitral annular calcification.   6. The aortic valve is calcified. Aortic valve regurgitation is mild.  Mild aortic valve stenosis.    -- Milon Dikes, MD Neurologist [facility]

## 2023-11-01 NOTE — Plan of Care (Signed)
  Problem: Education: Goal: Ability to describe self-care measures that may prevent or decrease complications (Diabetes Survival Skills Education) will improve Outcome: Progressing   Problem: Coping: Goal: Ability to adjust to condition or change in health will improve Outcome: Progressing   Problem: Health Behavior/Discharge Planning: Goal: Ability to identify and utilize available resources and services will improve Outcome: Progressing   Problem: Nutritional: Goal: Maintenance of adequate nutrition will improve Outcome: Progressing   Problem: Skin Integrity: Goal: Risk for impaired skin integrity will decrease Outcome: Progressing   Problem: Tissue Perfusion: Goal: Adequacy of tissue perfusion will improve Outcome: Progressing   Problem: Education: Goal: Knowledge of disease or condition will improve Outcome: Progressing   Problem: Coping: Goal: Will verbalize positive feelings about self Outcome: Progressing   Problem: Nutrition: Goal: Risk of aspiration will decrease Outcome: Progressing   Problem: Nutrition: Goal: Adequate nutrition will be maintained Outcome: Progressing   Problem: Coping: Goal: Level of anxiety will decrease Outcome: Progressing

## 2023-11-01 NOTE — TOC Progression Note (Signed)
Transition of Care [facility]) - Progression Note    Patient Details  Name: Jocelyn Elliott MRN: [id number] Date of Birth: 1932-02-13  Transition of Care [facility]) CM/SW Contact  Liliana Cline, LCSW Phone Number: 11/01/2023, 4:38 PM  Clinical Narrative:    Awaiting CIR auth. TOC will follow over weekend and if patient progresses to be able to return home with Home Helath, TOC will need to follow up to arrange this.     Barriers to Discharge: Continued Medical Work up  Expected Discharge Plan and Services                                               Social Determinants of Health (SDOH) Interventions SDOH Screenings   Food Insecurity: No Food Insecurity (10/31/2023)  Housing: Low Risk  (10/31/2023)  Transportation Needs: No Transportation Needs (10/31/2023)  Utilities: Not At Risk (10/31/2023)  Alcohol Screen: Low Risk  (04/02/2023)  Depression (PHQ2-9): Low Risk  (04/02/2023)  Financial Resource Strain: Low Risk  (04/02/2023)  Physical Activity: Insufficiently Active (04/02/2023)  Social Connections: Socially Integrated (04/02/2023)  Stress: No Stress Concern Present (04/02/2023)  Tobacco Use: Medium Risk (10/29/2023)    Readmission Risk Interventions    10/30/2023   11:54 AM  Readmission Risk Prevention Plan  Post Dischage Appt Complete  Medication Screening Complete  Transportation Screening Complete

## 2023-11-01 NOTE — Progress Notes (Signed)
Inpatient Rehabilitation Admissions Coordinator   I spoke with daughter by phone at her Mom's bedside. I explained that we can pursue CIR admit at [facility] in [place], but will not hear from insurance until likely next week. We will monitor her progress to determine if she will still need CIR level rehab as she remains admitted. Daughter prefers [place] to pursue approval. I have updated acute team and TOC and ask that I get updated therapy notes to pursue approval.  Ottie Glazier, RN, MSN Rehab Admissions Coordinator [phone] 11/01/2023 11:25 AM

## 2023-11-01 NOTE — Progress Notes (Signed)
[facility] Progress Note  Patient: Jocelyn Elliott    ZOX:[id number]  DOA: 10/29/2023     Date of Service: the patient was seen and examined on 11/01/2023  No chief complaint on file.  Brief hospital course: Jocelyn Elliott is a 89 y.o. female with medical history significant for Prior history of A-fib not on anticoagulation, chronic IDA followed by oncology, CKD 3, CAD, prior TIA s/p bilateral carotid endarterectomy(recent duplex 08/2023 with1-39% RICA and 40-50% LICA restenosis) who presents to the ED with confusion, difficulty with speech, unsteady gait, blurred vision in her right eye and pain in her right arm and leg.  History is provided by her daughter at the bedside.  She states patient lives alone and takes care of her bills and is fully independent at baseline.  She states the day prior she went to have an eye exam and had eye dilatation and was in her usual state of health.  On the day of arrival however she awoke and " did not seem right" because of the aforementioned symptoms. ED course and data review: Vitals within normal limits Pertinent workup includes the following: CBC unremarkable, BMP notable for baseline creatinine of 1.09 Troponin 26 EKG, personally viewed and interpreted showing A-fib at 86 with no ischemic ST-T wave changes CT head nonacute but shows small old left cerebellar infarct and encephalomalacia Patient was given chewable aspirin 325 Hospitalist consulted for admission for stroke workup.     Assessment and Plan:  # CVA (cerebral vascular accident) # History of TIA # History of bilateral carotid endarterectomy Patient with strokelike symptoms presenting outside tPA window, initial CT negative Permissive hypertension for first 24-48 hrs post stroke onset: Prn Labetalol IV or Vasotec IV If BP greater than 220/120  Statins for LDL goal less than 70.  TSH 2.1 within normal range S/p ASA 81mg  daily, 12/12 started Eliquis 2.5 mg p.o. twice  daily Telemetry, echo, MRI/MRA---> MRI positive for stroke pending official read.  Patient was unable to tolerate lying flat to get MRI Avoid dextrose containing fluids, Maintain euglycemia, euthermia Neuro checks q4 hrs x 24 hrs and then per shift Head of bed 30 degrees Physical therapy/Occupational therapy/Speech therapy if failed dysphagia screen Neurology consulted, rec DOAC after clearance from GI and Heme/onc D/w GI and oncologist, recommended to start DOAC and monitor H&H and transfuse as needed 12/11 discussed with patient's daughter regarding DOACs, she stated that patient is having dark stools all the time due to AVM and high risk for bleeding.  Patient needs to follow with a ophthalmologist as an outpatient for prism glasses 12/12 discussed with patient and family, started Eliquis 2.5 mg p.o. twice daily.  Risk and benefit explained. Monitor H&H  # Paroxysmal atrial fibrillation Prior history of A-fib not on anticoagulation due to AVM, GI bleed, still having dark black stools and iron deficiency, getting IV iron infusions. Currently in A-fib with controlled rate 12/12 started Eliquis 2.5 mg p.o. twice daily Patient was advised to follow-up with cardiology for evaluation of Watchman device if unable to tolerate DOAC  # Hypotension, blood pressure is very soft 12/13 Started midodrine 5 mg p.o. 3 times daily with holding parameters to hold if SBP >120  # stage 3a chronic kidney disease Renal function at baseline   # IDA (iron deficiency anemia) Receives Retacrit infusions with oncology   # Diabetes mellitus (HCC) Sliding scale insulin coverage   # CAD (coronary artery disease) Continue aspirin and rosuvastatin  # Vitamin D deficiency: started  vitamin D 50,000 units p.o. weekly, follow with PCP to repeat vitamin D level after 3 to 6 months.   Body mass index is 27.87 kg/m.  Interventions:  Diet: Heart healthy and carb modified diet DVT Prophylaxis: Subcutaneous  Lovenox   Advance goals of care discussion: DNR-limited  Family Communication: family was present at bedside, at the time of interview.  The pt provided permission to discuss medical plan with the family. Opportunity was given to ask question and all questions were answered satisfactorily.   Disposition:  Pt is from home, admitted with acute CVA, history of A-fib, remains at high risk for GI bleed, which precludes a safe discharge. PT/OT eval done recommended inpatient rehab. Discharge to CIR, when bed will be available.  TOC following for disposition plan.  Insurance Auth is pending  Subjective: No significant events overnight, patient feels improvement in the right-sided weakness but no improvement in the right eye peripheral vision.  Denied any new neurological complaints, no headache or dizziness.  Physical Exam: General: NAD, lying comfortably Appear in no distress, affect appropriate Eyes: PERRLA ENT: Oral Mucosa Clear, moist  Neck: no JVD,  Cardiovascular: Irregular rhythm, no Murmur,  Respiratory: good respiratory effort, Bilateral Air entry equal and Decreased, no Crackles, no wheezes Abdomen: Bowel Sound present, Soft and no tenderness,  Skin: no rashes Extremities: mild pedal edema, no calf tenderness Neurologic: Right-sided weakness power 4/5 Gait not checked due to patient safety concerns  Vitals:   11/01/23 0000 11/01/23 0437 11/01/23 0728 11/01/23 1128  BP: 106/62 (!) 113/55 (!) 97/47 (!) 111/57  Pulse: 82 73 71 70  Resp: 16 16 14 16   Temp: (!) 97.5 F (36.4 C) 97.7 F (36.5 C) 97.8 F (36.6 C) 97.8 F (36.6 C)  TempSrc: Oral Oral Oral Oral  SpO2: 95% 93% 97% 94%  Weight:      Height:        Intake/Output Summary (Last 24 hours) at 11/01/2023 1304 Last data filed at 11/01/2023 0900 Gross per 24 hour  Intake 240 ml  Output --  Net 240 ml   Filed Weights   10/29/23 2301  Weight: 62.6 kg    Data Reviewed: I have personally reviewed and interpreted  daily labs, tele strips, imagings as discussed above. I reviewed all nursing notes, pharmacy notes, vitals, pertinent old records I have discussed plan of care as described above with RN and patient/family.  CBC: Recent Labs  Lab 10/29/23 2303 10/31/23 0425 11/01/23 0847  WBC 6.3 9.0 6.1  NEUTROABS 3.8  --   --   HGB 12.0 12.6 11.6*  HCT 36.5 38.7 35.6*  MCV 102.5* 100.5* 100.3*  PLT 170 217 173   Basic Metabolic Panel: Recent Labs  Lab 10/29/23 2303 10/31/23 0425 11/01/23 0847  NA 137 136 136  K 3.8 4.1 3.9  CL 108 107 106  CO2 22 21* 22  GLUCOSE 99 111* 226*  BUN 24* 30* 27*  CREATININE 1.09* 1.31* 1.31*  CALCIUM 8.8* 8.9 8.8*  MG 2.3 2.2 2.2  PHOS 3.1 3.0 2.8    Studies: No results found.  Scheduled Meds:  apixaban  2.5 mg Oral BID   insulin aspart  0-15 Units Subcutaneous TID WC   insulin aspart  0-5 Units Subcutaneous QHS   midodrine  5 mg Oral TID WC   rosuvastatin  40 mg Oral Daily   Vitamin D (Ergocalciferol)  50,000 Units Oral Q7 days   Continuous Infusions: PRN Meds: acetaminophen **OR** acetaminophen (TYLENOL) oral liquid 160  mg/5 mL **OR** acetaminophen, morphine injection, traZODone  Time spent: 40 minutes  Author: Gillis Santa. MD [facility] 11/01/2023 1:04 PM  To reach On-call, see care teams to locate the attending and reach out to them via [email or web address]. If 7PM-7AM, please contact night-coverage If you still have difficulty reaching the attending provider, please page the [facility] (Director on Call) for [facility] on amion for assistance.

## 2023-11-01 NOTE — Plan of Care (Signed)
Pt alert and oriented, denies any c/o pain. Pt had CVA with some right side weakness. Pt getting up to chair and bathroom with FWW. Daughter at bedside.  Problem: Education: Goal: Ability to describe self-care measures that may prevent or decrease complications (Diabetes Survival Skills Education) will improve Outcome: Progressing Goal: Individualized Educational Video(s) Outcome: Progressing   Problem: Coping: Goal: Ability to adjust to condition or change in health will improve Outcome: Progressing   Problem: Fluid Volume: Goal: Ability to maintain a balanced intake and output will improve Outcome: Progressing   Problem: Health Behavior/Discharge Planning: Goal: Ability to identify and utilize available resources and services will improve Outcome: Progressing Goal: Ability to manage health-related needs will improve Outcome: Progressing   Problem: Metabolic: Goal: Ability to maintain appropriate glucose levels will improve Outcome: Progressing   Problem: Nutritional: Goal: Maintenance of adequate nutrition will improve Outcome: Progressing Goal: Progress toward achieving an optimal weight will improve Outcome: Progressing   Problem: Skin Integrity: Goal: Risk for impaired skin integrity will decrease Outcome: Progressing   Problem: Tissue Perfusion: Goal: Adequacy of tissue perfusion will improve Outcome: Progressing   Problem: Education: Goal: Knowledge of disease or condition will improve Outcome: Progressing Goal: Knowledge of secondary prevention will improve (MUST DOCUMENT ALL) Outcome: Progressing Goal: Knowledge of patient specific risk factors will improve Loraine Leriche N/A or DELETE if not current risk factor) Outcome: Progressing   Problem: Ischemic Stroke/TIA Tissue Perfusion: Goal: Complications of ischemic stroke/TIA will be minimized Outcome: Progressing   Problem: Coping: Goal: Will verbalize positive feelings about self Outcome: Progressing Goal: Will  identify appropriate support needs Outcome: Progressing   Problem: Health Behavior/Discharge Planning: Goal: Ability to manage health-related needs will improve Outcome: Progressing Goal: Goals will be collaboratively established with patient/family Outcome: Progressing   Problem: Self-Care: Goal: Ability to participate in self-care as condition permits will improve Outcome: Progressing Goal: Verbalization of feelings and concerns over difficulty with self-care will improve Outcome: Progressing Goal: Ability to communicate needs accurately will improve Outcome: Progressing   Problem: Nutrition: Goal: Risk of aspiration will decrease Outcome: Progressing Goal: Dietary intake will improve Outcome: Progressing   Problem: Education: Goal: Knowledge of General Education information will improve Description: Including pain rating scale, medication(s)/side effects and non-pharmacologic comfort measures Outcome: Progressing   Problem: Health Behavior/Discharge Planning: Goal: Ability to manage health-related needs will improve Outcome: Progressing   Problem: Clinical Measurements: Goal: Ability to maintain clinical measurements within normal limits will improve Outcome: Progressing Goal: Will remain free from infection Outcome: Progressing Goal: Diagnostic test results will improve Outcome: Progressing Goal: Respiratory complications will improve Outcome: Progressing Goal: Cardiovascular complication will be avoided Outcome: Progressing   Problem: Activity: Goal: Risk for activity intolerance will decrease Outcome: Progressing   Problem: Nutrition: Goal: Adequate nutrition will be maintained Outcome: Progressing   Problem: Coping: Goal: Level of anxiety will decrease Outcome: Progressing   Problem: Elimination: Goal: Will not experience complications related to bowel motility Outcome: Progressing Goal: Will not experience complications related to urinary  retention Outcome: Progressing   Problem: Pain Management: Goal: General experience of comfort will improve Outcome: Progressing   Problem: Safety: Goal: Ability to remain free from injury will improve Outcome: Progressing

## 2023-11-01 NOTE — Plan of Care (Signed)
  Problem: Education: Goal: Ability to describe self-care measures that may prevent or decrease complications (Diabetes Survival Skills Education) will improve Outcome: Progressing   Problem: Coping: Goal: Ability to adjust to condition or change in health will improve Outcome: Progressing   Problem: Fluid Volume: Goal: Ability to maintain a balanced intake and output will improve Outcome: Progressing   Problem: Metabolic: Goal: Ability to maintain appropriate glucose levels will improve Outcome: Progressing   Problem: Nutritional: Goal: Maintenance of adequate nutrition will improve Outcome: Progressing   Problem: Skin Integrity: Goal: Risk for impaired skin integrity will decrease Outcome: Progressing   Problem: Tissue Perfusion: Goal: Adequacy of tissue perfusion will improve Outcome: Progressing   Problem: Education: Goal: Knowledge of disease or condition will improve Outcome: Progressing   Problem: Ischemic Stroke/TIA Tissue Perfusion: Goal: Complications of ischemic stroke/TIA will be minimized Outcome: Progressing   Problem: Coping: Goal: Will verbalize positive feelings about self Outcome: Progressing   Problem: Self-Care: Goal: Ability to participate in self-care as condition permits will improve Outcome: Progressing   Problem: Education: Goal: Knowledge of General Education information will improve Description: Including pain rating scale, medication(s)/side effects and non-pharmacologic comfort measures Outcome: Progressing   Problem: Nutrition: Goal: Adequate nutrition will be maintained Outcome: Progressing   Problem: Coping: Goal: Level of anxiety will decrease Outcome: Progressing

## 2023-11-01 NOTE — Evaluation (Signed)
Speech Language Pathology Evaluation Patient Details Name: Jocelyn Elliott MRN: [id number] DOB: 1932-06-29 Today's Date: 11/01/2023 Time: 1400-1430 SLP Time Calculation (min) (ACUTE ONLY): 30 min  Problem List:  Patient Active Problem List   Diagnosis Date Noted   CVA (cerebral vascular accident) (HCC) 10/30/2023   Stage 3a chronic kidney disease (HCC) 10/30/2023   CAP (community acquired pneumonia) 03/01/2023   SOB (shortness of breath) 02/28/2023   Ear pain, left 04/11/2021   COVID-19 virus infection 04/11/2021   Aortic atherosclerosis (HCC) 04/10/2021   Thrombocytopenia (HCC) 04/10/2021   Fall 04/10/2021   Difficulty with speech 02/12/2021   Peripheral edema 01/02/2021   Anemia of chronic kidney failure, stage 3 (moderate) (HCC) 07/20/2019   Chest pain 04/17/2019   Bradycardia 02/02/2019   Paroxysmal atrial fibrillation (HCC) 02/02/2019   Stage 3b chronic kidney disease (HCC) 10/18/2018   SI (sacroiliac) joint dysfunction 01/07/2018   Dizziness 05/22/2017   History of total left hip replacement 05/17/2017   Trochanteric bursitis of left hip 05/17/2017   IDA (iron deficiency anemia) 09/20/2016   Hypotension 04/06/2016   Other specified respiratory disorders 04/06/2016   Health care maintenance 10/28/2015   2-vessel coronary artery disease 09/22/2015   SOB (shortness of breath) on exertion 09/20/2015   Knee pain, bilateral 09/20/2015   Sleeping difficulty 07/24/2015   Hepatomegaly 07/24/2015   Sleep disorder 07/24/2015   Hepatomegaly, not elsewhere classified 07/24/2015   Generalized anxiety disorder 04/23/2015   Hip pain 10/11/2013   Pain in hip 10/11/2013   Tendonitis 07/19/2013   Leg numbness 04/07/2013   CAD (coronary artery disease) 09/05/2012   Hypertension 09/05/2012   Hypercholesteremia 09/05/2012   Diabetes mellitus (HCC) 09/05/2012   Carotid arterial disease (HCC) 09/05/2012   Normocytic anemia 09/05/2012   Disorder of artery or arteriole (HCC)  09/05/2012   Essential (primary) hypertension 09/05/2012   Anemia 09/05/2012   Past Medical History:  Past Medical History:  Diagnosis Date   Arthritis    AV malformation of gastrointestinal tract    Basal cell carcinoma 08/07/2023   Central upper forehead. Nodular pattern. Mohs 09/09/23   Blood in stool    CAD (coronary artery disease)    Carotid arterial disease (HCC)    Cataracts, bilateral    Chronic blood loss anemia    Chronic cystitis    CKD (chronic kidney disease), stage III (HCC)    Complication of anesthesia    reaction to propofol - caused heart to pause   Depression    Diabetes mellitus (HCC)    GERD (gastroesophageal reflux disease)    Hyperlipidemia    Hypertension    IDA (iron deficiency anemia)    Stress incontinence    TIA (transient ischemic attack) 03/2021   No deficits   Wears dentures    full upper and lower   Past Surgical History:  Past Surgical History:  Procedure Laterality Date   ABDOMINAL HYSTERECTOMY  11/19/1970   ovaries left in place   APPENDECTOMY  11/19/1990   BACK SURGERY  06/08/2010   L3, L4, L5   CARDIAC CATHETERIZATION  02/2001   CAROTID ARTERY ANGIOPLASTY  09/19/2002   CARPAL TUNNEL RELEASE  1991, 92   right then left    CATARACT EXTRACTION W/PHACO Left 02/13/2022   Procedure: CATARACT EXTRACTION PHACO AND INTRAOCULAR LENS PLACEMENT (IOC) LEFT DIABETIC 14.90 01:26.8;  Surgeon: Galen Manila, MD;  Location: [facility];  Service: Ophthalmology;  Laterality: Left;  Diabetic   CATARACT EXTRACTION W/PHACO Right 03/06/2022   Procedure: CATARACT  EXTRACTION PHACO AND INTRAOCULAR LENS PLACEMENT (IOC) RIGHT DIABETIC 9.66 00:53.7;  Surgeon: Galen Manila, MD;  Location: [facility];  Service: Ophthalmology;  Laterality: Right;  Diabetic   CHOLECYSTECTOMY  11/20/1991   COLONOSCOPY WITH PROPOFOL N/A 02/02/2019   Procedure: COLONOSCOPY WITH PROPOFOL;  Surgeon: Scot Jun, MD;  Location: [facility];  Service:  Endoscopy;  Laterality: N/A;   ESOPHAGOGASTRODUODENOSCOPY N/A 02/02/2019   Procedure: ESOPHAGOGASTRODUODENOSCOPY (EGD);  Surgeon: Scot Jun, MD;  Location: [facility];  Service: Endoscopy;  Laterality: N/A;   FOOT SURGERY  06/20/2007   Left   right carotid artery surgery  01/09/2013   Dr. Gilda Crease @ [facility]   TOTAL HIP ARTHROPLASTY  05/03/2011   left 12, right 11/07   HPI:  89 y.o. female who presents to the ED with confusion, difficulty with speech, unsteady gait, blurred vision in her right eye and pain in her right arm and leg. MRI head findings: acute infarct in the left thalamus capsular, periatrial, and medial temporal areas with PMHx: A-fib not on anticoagulation, chronic IDA followed by oncology, CKD 3, CAD, prior TIA s/p bilateral carotid endarterectomy(recent duplex 08/2023 with1-39% RICA and 40-50% LICA restenosis)   Assessment / Plan / Recommendation Clinical Impression  Pt presents with mild cognitive communication impairment that is c/b intermittent language of confusion, disorientation to year, higher level attention issues and decreased ability to self-monitor and self-correct verbal errors. Suspect pt has some short-term and retrieval deficits related to acute stroke. Pt's expressive and receptive language abilities are strengths as well as her speech intelligibility. At this time, would recommend 24 hour supervision at discharge to ensure safety and ability to effectively communicate wants/needs.    SLP Assessment  SLP Recommendation/Assessment: All further Speech Lanaguage Pathology  needs can be addressed in the next venue of care SLP Visit Diagnosis: Cognitive communication deficit (R41.841)    Recommendations for follow up therapy are one component of a multi-disciplinary discharge planning process, led by the attending physician.  Recommendations may be updated based on patient status, additional functional criteria and insurance authorization.    Follow Up  Recommendations  Acute inpatient rehab (3hours/day) -    Assistance Recommended at Discharge  Frequent or constant Supervision/Assistance  Functional Status Assessment Patient has had a recent decline in their functional status and demonstrates the ability to make significant improvements in function in a reasonable and predictable amount of time.  Frequency and Duration           SLP Evaluation Cognition  Overall Cognitive Status: Impaired/Different from baseline Arousal/Alertness: Awake/alert Orientation Level: Oriented to person;Oriented to place;Oriented to time;Oriented to situation (disoriented to year) Attention: Selective Selective Attention: Appears intact Memory: Impaired Memory Impairment: Storage deficit;Retrieval deficit;Decreased short term memory Decreased Short Term Memory: Verbal complex;Functional complex Awareness: Impaired Awareness Impairment: Anticipatory impairment;Emergent impairment Problem Solving: Impaired Problem Solving Impairment: Verbal complex;Functional complex Executive Function: Self Monitoring;Self Correcting;Reasoning Reasoning: Impaired Reasoning Impairment: Verbal complex;Functional complex Self Monitoring: Impaired Self Monitoring Impairment: Verbal complex;Functional complex Self Correcting: Impaired Self Correcting Impairment: Verbal complex;Functional complex Safety/Judgment: Impaired       Comprehension  Auditory Comprehension Overall Auditory Comprehension: Appears within functional limits for tasks assessed Yes/No Questions: Within Functional Limits Commands: Within Functional Limits Conversation: Simple Interfering Components: Working Radio broadcast assistant: Repetition Counsellor: Within Function Limits    Expression Expression Primary Mode of Expression: Verbal Verbal Expression Overall Verbal Expression: Appears within functional limits for tasks assessed Initiation: No  impairment Automatic Speech: Name;Social Response Level of Generative/Spontaneous Verbalization:  Conversation Repetition: No impairment Naming: No impairment Pragmatics: No impairment Non-Verbal Means of Communication: Not applicable Written Expression Dominant Hand: Right Written Expression: Not tested   Oral / Motor  Oral Motor/Sensory Function Overall Oral Motor/Sensory Function: Within functional limits Motor Speech Overall Motor Speech: Appears within functional limits for tasks assessed Respiration: Within functional limits Phonation: Normal Resonance: Within functional limits Articulation: Within functional limitis Intelligibility: Intelligible Motor Planning: Witnin functional limits Motor Speech Errors: Not applicable           Macallister Ashmead B. Dreama Saa, M.S., CCC-SLP, Tree surgeon Certified Brain Injury Specialist [facility]  [facility] Office [phone] Ascom [phone] Fax [phone]

## 2023-11-02 DIAGNOSIS — I63412 Cerebral infarction due to embolism of left middle cerebral artery: Secondary | ICD-10-CM | POA: Diagnosis not present

## 2023-11-02 LAB — GLUCOSE, CAPILLARY
Glucose-Capillary: 103 mg/dL — ABNORMAL HIGH (ref 70–99)
Glucose-Capillary: 131 mg/dL — ABNORMAL HIGH (ref 70–99)
Glucose-Capillary: 98 mg/dL (ref 70–99)
Glucose-Capillary: 197 mg/dL — ABNORMAL HIGH (ref 70–99)

## 2023-11-02 LAB — BASIC METABOLIC PANEL
Anion gap: 9 (ref 5–15)
BUN: 32 mg/dL — ABNORMAL HIGH (ref 8–23)
CO2: 24 mmol/L (ref 22–32)
Calcium: 9.2 mg/dL (ref 8.9–10.3)
Chloride: 107 mmol/L (ref 98–111)
Creatinine, Ser: 1.3 mg/dL — ABNORMAL HIGH (ref 0.44–1.00)
GFR, Estimated: 39 mL/min — ABNORMAL LOW (ref >60)
Glucose, Bld: 102 mg/dL — ABNORMAL HIGH (ref 70–99)
Potassium: 4.1 mmol/L (ref 3.5–5.1)
Sodium: 140 mmol/L (ref 135–145)

## 2023-11-02 LAB — CBC
HCT: 36.2 % (ref 36.0–46.0)
Hemoglobin: 11.9 g/dL — ABNORMAL LOW (ref 12.0–15.0)
MCH: 32.7 pg (ref 26.0–34.0)
MCHC: 32.9 g/dL (ref 30.0–36.0)
MCV: 99.5 fL (ref 80.0–100.0)
Platelets: 196 10*3/uL (ref 150–400)
RBC: 3.64 MIL/uL — ABNORMAL LOW (ref 3.87–5.11)
RDW: 13.8 % (ref 11.5–15.5)
WBC: 8.2 10*3/uL (ref 4.0–10.5)
nRBC: 0 % (ref 0.0–0.2)

## 2023-11-02 LAB — PHOSPHORUS: Phosphorus: 3 mg/dL (ref 2.5–4.6)

## 2023-11-02 LAB — MAGNESIUM: Magnesium: 2.4 mg/dL (ref 1.7–2.4)

## 2023-11-02 NOTE — Progress Notes (Signed)
Physical Therapy Treatment Patient Details Name: Jocelyn Elliott MRN: [id number] DOB: August 21, 1932 Today's Date: 11/02/2023   History of Present Illness 89 y.o. female who presents to the ED with confusion, difficulty with speech, unsteady gait, blurred vision in her right eye and pain in her right arm and leg. MRI head findings: acute infarct in the left thalamus capsular, periatrial, and medial temporal areas with PMHx: A-fib not on anticoagulation, chronic IDA followed by oncology, CKD 3, CAD, prior TIA s/p bilateral carotid endarterectomy(recent duplex 08/2023 with1-39% RICA and 40-50% LICA restenosis)    PT Comments  Pt was in BR alone upon arrival. Son in law present in room endorsing that pt I'ly ambulated to BR with use of RW. Pt was agreeable to session and remained cooperative throughout. She was easily and safely able to ambulate ~ 200 ft with RW. No LOB with BUE support. Author then had pt ambulate with HHA +1 and pt presents with unsteadiness. Lengthy education and recommendation for use of RW at all times. Pt states understanding. Author called daughter of pt to discuss DC disposition and change in recommendation. She is agreeable to [facility] services and will be with pt 24/7 at DC. Pt will benefit from HHPT to  maximize her independence and safety with all ADLs while decreasing fall risk.    If plan is discharge home, recommend the following: A little help with walking and/or transfers;A little help with bathing/dressing/bathroom;Assistance with cooking/housework;Direct supervision/assist for medications management;Direct supervision/assist for financial management;Assist for transportation;Help with stairs or ramp for entrance;Supervision due to cognitive status     Equipment Recommendations  Rolling walker (2 wheels);BSC/3in1       Precautions / Restrictions Precautions Precautions: Fall Restrictions Weight Bearing Restrictions Per Provider Order: No     Mobility  Bed  Mobility Overal bed mobility: Modified Independent  Transfers Overall transfer level: Needs assistance Equipment used: Rolling walker (2 wheels) Transfers: Sit to/from Stand Sit to Stand: Supervision     Ambulation/Gait Ambulation/Gait assistance: Supervision, Contact guard assist Gait Distance (Feet): 200 Feet Assistive device: Rolling walker (2 wheels), 1 person hand held assist Gait Pattern/deviations: Step-through pattern Gait velocity: decreased  General Gait Details: Pt6 ambulated with RW with supervision. CGA without AD +1 HHA. pt had ambulated with RW to BR independently when author arrived to room.      Balance Overall balance assessment: Needs assistance Sitting-balance support: Feet supported Sitting balance-Leahy Scale: Good     Standing balance support: Bilateral upper extremity supported, During functional activity, Reliant on assistive device for balance Standing balance-Leahy Scale: Good Standing balance comment: good balance with use of RW however high fall risk without UE support       Cognition Arousal: Alert Behavior During Therapy: WFL for tasks assessed/performed Overall Cognitive Status: Impaired/Different from baseline Area of Impairment: Safety/judgement, Awareness, Problem solving  Orientation Level: Disoriented to, Time, Situation    General Comments: pt is A and cooperative. does have some cognition deficits however follows commands throughout.               Pertinent Vitals/Pain Pain Assessment Pain Assessment: No/denies pain Breathing: normal     PT Goals (current goals can now be found in the care plan section) Acute Rehab PT Goals Patient Stated Goal: go home when able Progress towards PT goals: Progressing toward goals    Frequency    Min 1X/week       AM-PAC PT "6 Clicks" Mobility   Outcome Measure  Help needed turning from your back  to your side while in a flat bed without using bedrails?: A Little Help needed  moving from lying on your back to sitting on the side of a flat bed without using bedrails?: A Little Help needed moving to and from a bed to a chair (including a wheelchair)?: A Little Help needed standing up from a chair using your arms (e.g., wheelchair or bedside chair)?: A Little Help needed to walk in hospital room?: A Little Help needed climbing 3-5 steps with a railing? : A Little 6 Click Score: 18    End of Session   Activity Tolerance: Patient tolerated treatment well Patient left: in chair;with call bell/phone within reach;with family/visitor present Nurse Communication: Mobility status PT Visit Diagnosis: Other abnormalities of gait and mobility (R26.89);Difficulty in walking, not elsewhere classified (R26.2)     Time: 8657-8469 PT Time Calculation (min) (ACUTE ONLY): 20 min  Charges:    $Gait Training: 8-22 mins PT General Charges $$ ACUTE PT VISIT: 1 Visit                     Jetta Lout PTA 11/02/23, 2:38 PM

## 2023-11-02 NOTE — Progress Notes (Signed)
PT Cancellation Note  Patient Details Name: Jocelyn Elliott MRN: [id number] DOB: 09-Oct-1932   Cancelled Treatment:     PT attempt x 2 this morning. Pt is much more drowsy today. 1st attempt, pt was falling asleep while seated EOB attempting to eat breakfast. 2nd attempt pt unable to stay awake in recliner to safely participate. Author will return later this afternoon and continue to follow per current POC.    Rushie Chestnut 11/02/2023, 11:45 AM

## 2023-11-02 NOTE — Progress Notes (Signed)
Progress Note    Jocelyn Elliott  WUJ:[id number] DOB: 1932/03/25  DOA: 10/29/2023 PCP: Dale Lewiston, MD      Brief Narrative:    Medical records reviewed and are as summarized below:  Jocelyn Elliott is a 89 y.o. female  with medical history significant for Prior history of A-fib not on anticoagulation, chronic IDA followed by oncology, CKD 3, CAD, prior TIA s/p bilateral carotid endarterectomy(recent duplex 08/2023 with1-39% RICA and 40-50% LICA restenosis), who presented to the hospital with confusion, difficulty with speech, unsteady gait, blurred vision in right eye, pain in her right arm and right leg.  Patient lives alone at home and is fully independent at baseline.   She was admitted to the hospital for acute stroke.       Assessment/Plan:   Principal Problem:   CVA (cerebral vascular accident) [facility]) Active Problems:   Paroxysmal atrial fibrillation (HCC)   CAD (coronary artery disease)   Hypertension   Diabetes mellitus (HCC)   IDA (iron deficiency anemia)   Stage 3a chronic kidney disease (HCC)    Body mass index is 27.87 kg/m.   Acute stroke, acute infarct in the left thalamus capsular, periatrial and medial temporal areas), history of TIA, history of bilateral carotid endarterectomy: Continue Eliquis.  Outpatient follow-up with ophthalmologist recommended.   Paroxysmal atrial fibrillation: Of note, she was not on anticoagulation prior to admission because of history of GI bleed (AVMs), dark stools, iron deficiency requiring iron infusions. Follow-up with cardiologist recommended for evaluation for Watchman device if unable to tolerate direct oral anticoagulation.   Hypotension: BP is better.  Continue midodrine.   CAD: Continue rosuvastatin.  She was on low-dose aspirin prior to admission but this has been discontinued.   Vitamin D deficiency: Vitamin D level was 16.17.  She has been started on high-dose vitamin D.   Comorbidities  include CKD stage IIIa, type II DM, iron deficiency anemia    Diet Order             Diet heart healthy/carb modified Room service appropriate? Yes; Fluid consistency: Thin  Diet effective now                            Consultants: Neurologist  Procedures: None    Medications:    apixaban  2.5 mg Oral BID   insulin aspart  0-15 Units Subcutaneous TID WC   insulin aspart  0-5 Units Subcutaneous QHS   midodrine  5 mg Oral TID WC   rosuvastatin  40 mg Oral Daily   Vitamin D (Ergocalciferol)  50,000 Units Oral Q7 days   Continuous Infusions:   Anti-infectives (From admission, onward)    None              Family Communication/Anticipated D/C date and plan/Code Status   DVT prophylaxis: apixaban (ELIQUIS) tablet 2.5 mg Start: 10/31/23 1130 apixaban (ELIQUIS) tablet 2.5 mg     Code Status: Limited: Do not attempt resuscitation (DNR) -DNR-LIMITED -Do Not Intubate/DNI   Family Communication: None Disposition Plan: Plan to discharge to inpatient rehab center   Status is: Inpatient Remains inpatient appropriate because: Awaiting placement       Subjective:   Interval events noted.  She still has visual impairment on the right side.  No other complaints.  Objective:    Vitals:   11/02/23 0037 11/02/23 0512 11/02/23 0845 11/02/23 1108  BP: 96/78 (!) 130/55 (!) 113/54 (!) 111/57  Pulse: 88 68 86 83  Resp: 16 16 19 19   Temp: 97.9 F (36.6 C) (!) 97.3 F (36.3 C) 97.9 F (36.6 C) 98.2 F (36.8 C)  TempSrc:  Oral Oral Oral  SpO2: 97% 97% 97% 95%  Weight:      Height:       No data found.   Intake/Output Summary (Last 24 hours) at 11/02/2023 1234 Last data filed at 11/01/2023 1804 Gross per 24 hour  Intake 240 ml  Output 1 ml  Net 239 ml   Filed Weights   10/29/23 2301  Weight: 62.6 kg    Exam:  GEN: NAD SKIN: Warm and dry EYES: No pallor or icterus, right visual field cut ENT: MMM CV: RRR PULM: CTA B ABD: soft,  ND, NT, +BS CNS: AAO x 3, non focal EXT: No edema or tenderness        Data Reviewed:   I have personally reviewed following labs and imaging studies:  Labs: Labs show the following:   Basic Metabolic Panel: Recent Labs  Lab 10/29/23 2303 10/31/23 0425 11/01/23 0847 11/02/23 0537  NA 137 136 136 140  K 3.8 4.1 3.9 4.1  CL 108 107 106 107  CO2 22 21* 22 24  GLUCOSE 99 111* 226* 102*  BUN 24* 30* 27* 32*  CREATININE 1.09* 1.31* 1.31* 1.30*  CALCIUM 8.8* 8.9 8.8* 9.2  MG 2.3 2.2 2.2 2.4  PHOS 3.1 3.0 2.8 3.0   GFR Estimated Creatinine Clearance: 23.6 mL/min (A) (by C-G formula based on SCr of 1.3 mg/dL (H)). Liver Function Tests: No results for input(s): "AST", "ALT", "ALKPHOS", "BILITOT", "PROT", "ALBUMIN" in the last 168 hours. No results for input(s): "LIPASE", "AMYLASE" in the last 168 hours. No results for input(s): "AMMONIA" in the last 168 hours. Coagulation profile No results for input(s): "INR", "PROTIME" in the last 168 hours.  CBC: Recent Labs  Lab 10/29/23 2303 10/31/23 0425 11/01/23 0847 11/02/23 0537  WBC 6.3 9.0 6.1 8.2  NEUTROABS 3.8  --   --   --   HGB 12.0 12.6 11.6* 11.9*  HCT 36.5 38.7 35.6* 36.2  MCV 102.5* 100.5* 100.3* 99.5  PLT 170 217 173 196   Cardiac Enzymes: No results for input(s): "CKTOTAL", "CKMB", "CKMBINDEX", "TROPONINI" in the last 168 hours. BNP (last 3 results) No results for input(s): "PROBNP" in the last 8760 hours. CBG: Recent Labs  Lab 11/01/23 1209 11/01/23 1649 11/01/23 2150 11/02/23 0816 11/02/23 1123  GLUCAP 71 79 113* 103* 131*   D-Dimer: No results for input(s): "DDIMER" in the last 72 hours. Hgb A1c: No results for input(s): "HGBA1C" in the last 72 hours. Lipid Profile: No results for input(s): "CHOL", "HDL", "LDLCALC", "TRIG", "CHOLHDL", "LDLDIRECT" in the last 72 hours. Thyroid function studies: No results for input(s): "TSH", "T4TOTAL", "T3FREE", "THYROIDAB" in the last 72 hours.  Invalid  input(s): "FREET3" Anemia work up: No results for input(s): "VITAMINB12", "FOLATE", "FERRITIN", "TIBC", "IRON", "RETICCTPCT" in the last 72 hours. Sepsis Labs: Recent Labs  Lab 10/29/23 2303 10/31/23 0425 11/01/23 0847 11/02/23 0537  WBC 6.3 9.0 6.1 8.2    Microbiology No results found for this or any previous visit (from the past 240 hours).  Procedures and diagnostic studies:  No results found.             LOS: 3 days   Khole Arterburn  [facility]   Pager on [email or web address]. If 7PM-7AM, please contact night-coverage at [email or web address]     11/02/2023, 12:34 PM

## 2023-11-02 NOTE — Plan of Care (Signed)
  Problem: Education: Goal: Ability to describe self-care measures that may prevent or decrease complications (Diabetes Survival Skills Education) will improve Outcome: Progressing   Problem: Coping: Goal: Ability to adjust to condition or change in health will improve Outcome: Progressing   Problem: Metabolic: Goal: Ability to maintain appropriate glucose levels will improve Outcome: Progressing   Problem: Nutritional: Goal: Maintenance of adequate nutrition will improve Outcome: Progressing   Problem: Skin Integrity: Goal: Risk for impaired skin integrity will decrease Outcome: Progressing   Problem: Tissue Perfusion: Goal: Adequacy of tissue perfusion will improve Outcome: Progressing   Problem: Ischemic Stroke/TIA Tissue Perfusion: Goal: Complications of ischemic stroke/TIA will be minimized Outcome: Progressing   Problem: Coping: Goal: Will verbalize positive feelings about self Outcome: Progressing   Problem: Health Behavior/Discharge Planning: Goal: Ability to manage health-related needs will improve Outcome: Progressing   Problem: Self-Care: Goal: Ability to participate in self-care as condition permits will improve Outcome: Progressing   Problem: Nutrition: Goal: Risk of aspiration will decrease Outcome: Progressing

## 2023-11-02 NOTE — TOC Progression Note (Signed)
Transition of Care [facility]) - Progression Note    Patient Details  Name: Jocelyn Elliott MRN: [id number] Date of Birth: 06/18/1932  Transition of Care [facility]) CM/SW Contact  Rodney Langton, RN Phone Number: 11/02/2023, 4:36 PM  Clinical Narrative:     Spoke with daughter, updated on new recommendations to be discharged home with home health instead of CIR.  She agrees, state patient was active with [facility] prior to admission, they remain the preference.  Call placed to agency, awaiting call back to accept.     Barriers to Discharge: Continued Medical Work up  Expected Discharge Plan and Services                                               Social Determinants of Health (SDOH) Interventions SDOH Screenings   Food Insecurity: No Food Insecurity (10/31/2023)  Housing: Low Risk  (10/31/2023)  Transportation Needs: No Transportation Needs (10/31/2023)  Utilities: Not At Risk (10/31/2023)  Alcohol Screen: Low Risk  (04/02/2023)  Depression (PHQ2-9): Low Risk  (04/02/2023)  Financial Resource Strain: Low Risk  (04/02/2023)  Physical Activity: Insufficiently Active (04/02/2023)  Social Connections: Socially Integrated (04/02/2023)  Stress: No Stress Concern Present (04/02/2023)  Tobacco Use: Medium Risk (10/29/2023)    Readmission Risk Interventions    10/30/2023   11:54 AM  Readmission Risk Prevention Plan  Post Dischage Appt Complete  Medication Screening Complete  Transportation Screening Complete

## 2023-11-03 DIAGNOSIS — I63412 Cerebral infarction due to embolism of left middle cerebral artery: Secondary | ICD-10-CM | POA: Diagnosis not present

## 2023-11-03 LAB — GLUCOSE, CAPILLARY
Glucose-Capillary: 85 mg/dL (ref 70–99)
Glucose-Capillary: 124 mg/dL — ABNORMAL HIGH (ref 70–99)

## 2023-11-03 MED ORDER — VITAMIN D (ERGOCALCIFEROL) 1.25 MG (50000 UNIT) PO CAPS: 50000.0000 [IU] | ORAL_CAPSULE | ORAL | 0 refills | Status: DC

## 2023-11-03 MED ORDER — MIDODRINE HCL 5 MG PO TABS: 5.0000 mg | ORAL_TABLET | Freq: Two times a day (BID) | ORAL | 0 refills | Status: DC

## 2023-11-03 MED ORDER — APIXABAN 2.5 MG PO TABS: 2.5000 mg | ORAL_TABLET | Freq: Two times a day (BID) | ORAL | 0 refills | Status: DC

## 2023-11-03 NOTE — TOC Transition Note (Signed)
Transition of Care [facility]) - Discharge Note   Patient Details  Name: Jocelyn Elliott MRN: [id number] Date of Birth: 09-03-1932  Transition of Care [facility]) CM/SW Contact:  Rodney Langton, RN Phone Number: 11/03/2023, 1:23 PM   Clinical Narrative:     Patient and daughter aware of discharge orders. Advised that per [facility], they are not active with patient.  Per Delila Spence able to accept referral for HHPT/OT.  Per daughter's request, youth walker also ordered through Kim at [employer], will be delivered to the bedside.  No further TOC needs at this time.    Final next level of care: Home w Home Health Services Barriers to Discharge: Barriers Resolved   Patient Goals and CMS Choice Patient states their goals for this hospitalization and ongoing recovery are:: home with [facility]   Choice offered to / list presented to : Patient, Adult Children      Discharge Placement                       Discharge Plan and Services Additional resources added to the After Visit Summary for                  DME Arranged: Walker youth DME Agency: [employer] Date DME Agency Contacted: 11/03/23   Representative spoke with at DME Agency: Selena Batten HH Arranged: PT, OT [facility] Agency: [facility] Date [facility] Agency Contacted: 11/03/23 Time HH Agency Contacted: [id number] Representative spoke with at [facility] Agency: Kandee Keen  Social Drivers of Health (SDOH) Interventions SDOH Screenings   Food Insecurity: No Food Insecurity (10/31/2023)  Housing: Low Risk  (10/31/2023)  Transportation Needs: No Transportation Needs (10/31/2023)  Utilities: Not At Risk (10/31/2023)  Alcohol Screen: Low Risk  (04/02/2023)  Depression (PHQ2-9): Low Risk  (04/02/2023)  Financial Resource Strain: Low Risk  (04/02/2023)  Physical Activity: Insufficiently Active (04/02/2023)  Social Connections: Socially Integrated (04/02/2023)  Stress: No Stress Concern Present (04/02/2023)  Tobacco Use: Medium Risk (10/29/2023)     Readmission  Risk Interventions    10/30/2023   11:54 AM  Readmission Risk Prevention Plan  Post Dischage Appt Complete  Medication Screening Complete  Transportation Screening Complete

## 2023-11-03 NOTE — Discharge Summary (Signed)
Physician Discharge Summary   Patient: Jocelyn Elliott MRN: [id number] DOB: [birth year]  Admit date:     10/29/2023  Discharge date: 11/03/23  Discharge Physician: Lurene Shadow   PCP: Dale [place], MD   Recommendations at discharge:   Follow-up with neurologist in 8 weeks Follow-up with PCP in 1 week Follow-up with Dr. Druscilla Brownie, ophthalmologist as soon as possible  Discharge Diagnoses: Principal Problem:   CVA (cerebral vascular accident) [facility]) Active Problems:   Paroxysmal atrial fibrillation (HCC)   CAD (coronary artery disease)   Hypertension   Diabetes mellitus (HCC)   IDA (iron deficiency anemia)   Stage 3a chronic kidney disease (HCC)  Resolved Problems:   * No resolved hospital problems. *  Hospital Course:  Jocelyn Elliott is a 89 y.o. female  with medical history significant for Prior history of A-fib not on anticoagulation, chronic IDA followed by oncology, CKD 3, CAD, prior TIA s/p bilateral carotid endarterectomy(recent duplex 08/2023 with1-39% RICA and 40-50% LICA restenosis), who presented to the hospital with confusion, difficulty with speech, unsteady gait, blurred vision in right eye, pain in her right arm and right leg.  Patient lives alone at home and is fully independent at baseline.     She was admitted to the hospital for acute stroke.   Assessment and Plan:  Acute stroke, acute infarct in the left thalamus capsular, periatrial and medial temporal areas), history of TIA, history of bilateral carotid endarterectomy: Continue Eliquis.  Outpatient follow-up with ophthalmologist recommended.  She said she will see Dr. Melanie Crazier, ophthalmologist, as an outpatient. PT updated recommendations from discharge to inpatient rehab center to home with home health therapy   Paroxysmal atrial fibrillation: Of note, she was not on anticoagulation prior to admission because of history of GI bleed (AVMs), dark stools, iron deficiency requiring iron  infusions. Follow-up with cardiologist recommended for evaluation for Watchman device if unable to tolerate direct oral anticoagulation.     Hypotension: BP is better.  Continue midodrine.     CAD: Continue rosuvastatin.  She was on low-dose aspirin prior to admission but this has been discontinued.     Vitamin D deficiency: Vitamin D level was 16.17.  Continue high-dose vitamin D for now and transition to low-dose vitamin D if vitamin D levels improve.  Patient follow-up with PCP for monitoring and further recommendations.   Comorbidities include CKD stage IIIa, type II DM, iron deficiency anemia     Her condition is improved and she is deemed stable for discharge today. Plan discussed with [name] (daughter) and Jonny Ruiz (son-in-law) at the bedside.      Consultants: Neurologist Procedures performed: None Disposition: Home health Diet recommendation:  Discharge Diet Orders (From admission, onward)     Start     Ordered   11/03/23 0000  Diet - low sodium heart healthy        11/03/23 1130           Cardiac and Carb modified diet DISCHARGE MEDICATION: Allergies as of 11/03/2023       Reactions   Propofol Other (See Comments)   Caused heart to pause   Ciprofloxacin Rash   Levaquin [levofloxacin] Rash   Tequin [gatifloxacin] Hives, Rash        Medication List     STOP taking these medications    aspirin 81 MG tablet   guaiFENesin-dextromethorphan 100-10 MG/5ML syrup Commonly known as: ROBITUSSIN DM   triamcinolone cream 0.1 % Commonly known as: KENALOG       TAKE  these medications    apixaban 2.5 MG Tabs tablet Commonly known as: ELIQUIS Take 1 tablet (2.5 mg total) by mouth 2 (two) times daily.   Biotin 5000 MCG Caps Take 1 capsule by mouth daily.   cetirizine 10 MG tablet Commonly known as: ZYRTEC Take 10 mg by mouth daily.   docusate sodium 100 MG capsule Commonly known as: COLACE Take 100 mg by mouth daily as needed for mild  constipation.   ferrous sulfate 325 (65 FE) MG EC tablet Take 325 mg by mouth every other day. Every other day   finasteride 5 MG tablet Commonly known as: PROSCAR Take 5 mg by mouth daily.   Fish Oil 1200 MG Caps Take 1 capsule by mouth daily.   hydrocortisone 2.5 % cream apply topically 2 times daily as needed for itch at right lower leg   ketoconazole 2 % shampoo Commonly known as: NIZORAL MASSAGE INTO SCALP AND LET SIT SEVERAL MINUTES BEFORE RINSING. USE 2-3 TIMES A WEEK.   Lancet Device Misc Use as directed to check blood sugars   midodrine 5 MG tablet Commonly known as: PROAMATINE Take 1 tablet (5 mg total) by mouth 2 (two) times daily with a meal.   OneTouch Delica Plus Lancet30G Misc USE AS INSTRUCTED TO CHECK BLOOD SUGARS ONCE DAILY. DX E 11.9   Probiotic (Lactobacillus) Caps Take 1 capsule by mouth daily.   rosuvastatin 40 MG tablet Commonly known as: CRESTOR TAKE 1 TABLET BY MOUTH EVERY DAY   traZODone 50 MG tablet Commonly known as: DESYREL TAKE 1/2 TO 1 TABLET BY MOUTH AT BEDTIME AS NEEDED FOR SLEEP What changed:  how much to take reasons to take this   Vitamin D (Ergocalciferol) 1.25 MG (50000 UNIT) Caps capsule Commonly known as: DRISDOL Take 1 capsule (50,000 Units total) by mouth every 7 (seven) days. Start taking on: November 07, 2023               Durable Medical Equipment  (From admission, onward)           Start     Ordered   11/03/23 0000  For home use only DME Walker rolling       Comments: Youth walker  Question Answer Comment  Walker: With 5 Inch Wheels   Patient needs a walker to treat with the following condition Stroke [facility])      11/03/23 1130            Follow-up Information     Leanora Ivanoff, PA-C. Go in 1 week(s).   Contact information: [address] [facility] [place] [address] [phone]         Galen Manila, MD. Schedule an appointment as soon as possible for a visit in 1  week(s).   Specialty: Ophthalmology Contact information: [address] [place] [address] [phone]                Discharge Exam: Ceasar Mons Weights   10/29/23 2301  Weight: 62.6 kg   GEN: NAD SKIN: Warm and dry EYES: EOMI, mild right peripheral visual field cut ENT: MMM CV: Irregular rate and rhythm PULM: CTA B ABD: soft, ND, NT, +BS CNS: AAO x 3, non focal EXT: No edema or tenderness   Condition at discharge: good  The results of significant diagnostics from this hospitalization (including imaging, microbiology, ancillary and laboratory) are listed below for reference.   Imaging Studies: ECHOCARDIOGRAM COMPLETE Result Date: 10/31/2023    ECHOCARDIOGRAM REPORT   Patient Name:  Morrie Sheldon Date of Exam: 10/30/2023 Medical Rec #:  [id number]        Height:       59.0 in Accession #:    [id number]       Weight:       138.0 lb Date of Birth:  [birth year]        BSA:          1.575 m Patient Age:    89 years         BP:           136/68 mmHg Patient Gender: F                HR:           89 bpm. Exam Location:  [facility] Procedure: 2D Echo, Cardiac Doppler and Color Doppler Indications:     I63.9 Stroke  History:         Patient has no prior history of Echocardiogram examinations.                  CAD, TIA; Risk Factors:Hypertension, Diabetes and Dyslipidemia.  Sonographer:     Daphine Deutscher RDCS Referring Phys:  [id number] ASHISH ARORA Diagnosing Phys: Alwyn Pea MD IMPRESSIONS  1. Left ventricular ejection fraction, by estimation, is 55 to 60%. The left ventricle has normal function. The left ventricle has no regional wall motion abnormalities. Left ventricular diastolic parameters are consistent with Grade III diastolic dysfunction (restrictive).  2. Right ventricular systolic function is normal. The right ventricular size is normal.  3. Left atrial size was moderately dilated.  4. Right atrial size was mild to moderately dilated.  5. The mitral valve is  abnormal. Moderate mitral valve regurgitation. Moderate to severe mitral annular calcification.  6. The aortic valve is calcified. Aortic valve regurgitation is mild. Mild aortic valve stenosis. FINDINGS  Left Ventricle: Left ventricular ejection fraction, by estimation, is 55 to 60%. The left ventricle has normal function. The left ventricle has no regional wall motion abnormalities. The left ventricular internal cavity size was normal in size. There is  no left ventricular hypertrophy. Left ventricular diastolic parameters are consistent with Grade III diastolic dysfunction (restrictive). Right Ventricle: The right ventricular size is normal. No increase in right ventricular wall thickness. Right ventricular systolic function is normal. Left Atrium: Left atrial size was moderately dilated. Right Atrium: Right atrial size was mild to moderately dilated. Pericardium: There is no evidence of pericardial effusion. The mitral valve is abnormal. There is moderate thickening of the mitral valve leaflet(s). There is severe calcification of the mitral valve leaflet(s). Mildly decreased mobility of the mitral valve leaflets. Moderate to severe mitral annular calcification. Moderate mitral valve regurgitation. Tricuspid Valve: The tricuspid valve is grossly normal. Tricuspid valve regurgitation is mild. The aortic valve is calcified. Aortic valve regurgitation is mild. Mild aortic stenosis is present. Pulmonic Valve: The pulmonic valve was normal in structure. Pulmonic valve regurgitation is not visualized. Aorta: The ascending aorta was not well visualized. IAS/Shunts: No atrial level shunt detected by color flow Doppler.  LEFT VENTRICLE PLAX 2D LVIDd:         4.70 cm LVIDs:         3.30 cm LV PW:         0.90 cm LV IVS:        0.70 cm LVOT diam:     1.60 cm LV SV:         57 LV SV Index:  36 LVOT Area:     2.01 cm  RIGHT VENTRICLE            IVC RV Basal diam:  3.20 cm    IVC diam: 1.30 cm RV S prime:     9.99 cm/s TAPSE  (M-mode): 1.6 cm LEFT ATRIUM             Index        RIGHT ATRIUM          Index LA diam:        5.00 cm 3.17 cm/m   RA Area:     9.03 cm LA Vol (A2C):   41.2 ml 26.15 ml/m  RA Volume:   19.70 ml 12.51 ml/m LA Vol (A4C):   32.6 ml 20.69 ml/m LA Biplane Vol: 38.6 ml 24.50 ml/m  AORTIC VALVE AV Area (Vmax):    1.44 cm AV Area (Vmean):   1.41 cm AV Area (VTI):     1.43 cm AV Vmax:           215.60 cm/s AV Vmean:          151.000 cm/s AV VTI:            0.399 m AV Peak Grad:      18.6 mmHg AV Mean Grad:      10.6 mmHg LVOT Vmax:         154.67 cm/s LVOT Vmean:        105.667 cm/s LVOT VTI:          0.284 m LVOT/AV VTI ratio: 0.71  AORTA Ao Root diam: 3.10 cm Ao Asc diam:  3.50 cm MITRAL VALVE                TRICUSPID VALVE MV Area (PHT): 4.74 cm     TR Peak grad:   46.2 mmHg MV Area VTI:   1.51 cm     TR Vmax:        340.00 cm/s MV Peak grad:  16.5 mmHg MV Mean grad:  7.0 mmHg     SHUNTS MV Vmax:       2.03 m/s     Systemic VTI:  0.28 m MV Vmean:      120.5 cm/s   Systemic Diam: 1.60 cm MV Decel Time: 160 msec MV E velocity: 168.00 cm/s MV A velocity: 63.40 cm/s MV E/A ratio:  2.65 Dwayne D Callwood MD Electronically signed by Alwyn Pea MD Signature Date/Time: 10/31/2023/4:09:35 PM    Final    CT ANGIO HEAD NECK W WO CM Result Date: 10/30/2023 CLINICAL DATA:  Neuro deficit, acute, stroke suspected EXAM: CT ANGIOGRAPHY HEAD AND NECK WITH AND WITHOUT CONTRAST TECHNIQUE: Multidetector CT imaging of the head and neck was performed using the standard protocol during bolus administration of intravenous contrast. Multiplanar CT image reconstructions and MIPs were obtained to evaluate the vascular anatomy. Carotid stenosis measurements (when applicable) are obtained utilizing NASCET criteria, using the distal internal carotid diameter as the denominator. RADIATION DOSE REDUCTION: This exam was performed according to the departmental dose-optimization program which includes automated exposure control,  adjustment of the mA and/or kV according to patient size and/or use of iterative reconstruction technique. CONTRAST:  75mL OMNIPAQUE IOHEXOL 350 MG/ML SOLN COMPARISON:  None Available. FINDINGS: CT HEAD FINDINGS Brain: No hemorrhage. No hydrocephalus. No extra-axial fluid collection. There is a chronic left superior cerebellar infarct. No CT evidence of an acute cortical infarct. Mass effect. No mass lesion. There is a background  of moderate chronic microvascular ischemic change. Vascular: No hyperdense vessel or unexpected calcification. Skull: Normal. Negative for fracture or focal lesion. Sinuses/Orbits: No middle ear or mastoid effusion. Paranasal sinuses are clear. Bilateral lens replacement. Orbits are otherwise unremarkable. Other: None. Review of the MIP images confirms the above findings CTA NECK FINDINGS Aortic arch: Standard branching. Imaged portion shows no evidence of aneurysm or dissection. Atherosclerotic calcifications of the aortic arch. Moderate narrowing of the origin of the left subclavian artery likely the brachiocephalic artery. Right carotid system: No evidence of dissection, stenosis (50% or greater), or occlusion. Mild narrowing in the cavernous segment of the right ICA Left carotid system: No evidence of dissection or occlusion. Approximately 60% stenosis of the origin of the left ICA secondary to soft atherosclerotic plaque. Somewhat beaded and irregular appearance of the distal left common carotid artery, as can be seen in the setting of FMD. Vertebral arteries: Right dominant. Moderate to severe stenosis of the origin of the left vertebral artery secondary to calcified atherosclerotic plaque no evidence of dissection, stenosis (50% or greater), or occlusion. Skeleton: Grade 1 anterolisthesis of C3 on C4 and C4 on C5 Other neck: Negative. Upper chest: Negative. Review of the MIP images confirms the above findings CTA HEAD FINDINGS Anterior circulation: No significant stenosis, proximal  occlusion, aneurysm, or vascular malformation. Posterior circulation: No significant stenosis, proximal occlusion, aneurysm, or vascular malformation. Moderate narrowing in the P2 segment of the left PCA Venous sinuses: As permitted by contrast timing, patent. Anatomic variants: No Review of the MIP images confirms the above findings IMPRESSION: 1. No hemorrhage or CT evidence of an acute cortical infarct. 2. No intracranial large vessel occlusion. 3. Approximately 60% stenosis of the origin of the left ICA secondary to soft atherosclerotic plaque. 4. Moderate to severe stenosis of the origin of the left vertebral artery secondary to calcified atherosclerotic plaque. 5. Somewhat beaded and irregular appearance of the distal left common carotid artery and proximal left ICA, as can be seen in the setting of FMD. Aortic Atherosclerosis (ICD10-I70.0). Electronically Signed   By: Lorenza Cambridge M.D.   On: 10/30/2023 11:18   MR BRAIN WO CONTRAST Result Date: 10/30/2023 CLINICAL DATA:  Onset of right-sided weakness and confusion with cysts speech difficulty today. EXAM: MRI HEAD WITHOUT CONTRAST TECHNIQUE: Multiplanar, multiecho pulse sequences of the brain and surrounding structures were obtained without intravenous contrast. COMPARISON:  Head CT from yesterday. FINDINGS: Brain: Band of restricted diffusion at the upper and lateral left thalamus extending laterally to the periatrial white matter and medial temporal cortex. There is a background of chronic small vessel ischemic gliosis in the deep cerebral white matter. Chronic left cerebellar infarction. No evidence of hemorrhage, hydrocephalus, mass, or collection. Vascular: Grossly preserved major flow voids by axial T2 weighted imaging. Skull and upper cervical spine: No gross marrow lesion Sinuses/Orbits: Negative Other: Progressive severe motion artifact with numerous nondiagnostic sequences. The scan was truncated before MRA and coronal T2 weighted imaging.  IMPRESSION: Very motion degraded and truncated brain MRI but still positive for acute infarct in the left thalamus capsular, periatrial, and medial temporal areas (thalamoperforator versus anterior choroidal infarction). Electronically Signed   By: Tiburcio Pea M.D.   On: 10/30/2023 04:39   CT HEAD WO CONTRAST ( ) Result Date: 10/29/2023 CLINICAL DATA:  Altered mental status. EXAM: CT HEAD WITHOUT CONTRAST TECHNIQUE: Contiguous axial images were obtained from the base of the skull through the vertex without intravenous contrast. RADIATION DOSE REDUCTION: This exam was performed according to the  departmental dose-optimization program which includes automated exposure control, adjustment of the mA and/or kV according to patient size and/or use of iterative reconstruction technique. COMPARISON:  Head CT dated 02/15/2021. FINDINGS: Brain: Moderate age-related atrophy and chronic microvascular ischemic changes. 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 There is no acute intracranial hemorrhage. No mass effect or midline shift. No extra-axial fluid collection. Vascular: No hyperdense vessel or unexpected calcification. Skull: Normal. Negative for fracture or focal lesion. Sinuses/Orbits: No acute finding. Other: None IMPRESSION: 1. No acute intracranial pathology. 2. Moderate age-related atrophy and chronic microvascular ischemic changes. 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 Electronically Signed   By: Elgie Collard M.D.   On: 10/29/2023 23:35    Microbiology: Results for orders placed or performed during the hospital encounter of 02/28/23  Resp panel by RT-PCR (RSV, Flu A&B, Covid) Anterior Nasal Swab     Status: None   Collection Time: 02/28/23 11:18 PM   Specimen: Anterior Nasal Swab  Result Value Ref Range Status   SARS Coronavirus 2 by RT PCR NEGATIVE NEGATIVE Final    Comment: (NOTE) SARS-CoV-2 target nucleic acids are NOT DETECTED.  The SARS-CoV-2 RNA is  generally detectable in upper respiratory specimens during the acute phase of infection. The lowest concentration of SARS-CoV-2 viral copies this assay can detect is 138 copies/mL. A negative result does not preclude SARS-Cov-2 infection and should not be used as the sole basis for treatment or other patient management decisions. A negative result may occur with  improper specimen collection/handling, submission of specimen other than nasopharyngeal swab, presence of viral mutation(s) within the areas targeted by this assay, and inadequate number of viral copies(<138 copies/mL). A negative result must be combined with clinical observations, patient history, and epidemiological information. The expected result is Negative.  Fact Sheet for Patients:  BloggerCourse.com  Fact Sheet for Healthcare Providers:  SeriousBroker.it  This test is no t yet approved or cleared by the [place] FDA and  has been authorized for detection and/or diagnosis of SARS-CoV-2 by FDA under an Emergency Use Authorization (EUA). This EUA will remain  in effect (meaning this test can be used) for the duration of the COVID-19 declaration under Section 564(b)(1) of the Act, 21 U.S.C.section 360bbb-3(b)(1), unless the authorization is terminated  or revoked sooner.       Influenza A by PCR NEGATIVE NEGATIVE Final   Influenza B by PCR NEGATIVE NEGATIVE Final    Comment: (NOTE) The Xpert Xpress SARS-CoV-2/FLU/RSV plus assay is intended as an aid in the diagnosis of influenza from Nasopharyngeal swab specimens and should not be used as a sole basis for treatment. Nasal washings and aspirates are unacceptable for Xpert Xpress SARS-CoV-2/FLU/RSV testing.  Fact Sheet for Patients: BloggerCourse.com  Fact Sheet for Healthcare Providers: SeriousBroker.it  This test is not yet approved or cleared by the [place] FDA and has been authorized for detection and/or diagnosis of SARS-CoV-2 by FDA under an Emergency Use Authorization (EUA). This EUA will remain in effect (meaning this test can be used) for the duration of the COVID-19 declaration under Section 564(b)(1) of the Act, 21 U.S.C. section 360bbb-3(b)(1), unless the authorization is terminated or revoked.     Resp Syncytial Virus by PCR NEGATIVE NEGATIVE Final    Comment: (NOTE) Fact Sheet for Patients: BloggerCourse.com  Fact Sheet for Healthcare Providers: SeriousBroker.it  This test is not yet approved or cleared by the [place] FDA and has been authorized for detection and/or diagnosis of SARS-CoV-2 by FDA under  an Emergency Use Authorization (EUA). This EUA will remain in effect (meaning this test can be used) for the duration of the COVID-19 declaration under Section 564(b)(1) of the Act, 21 U.S.C. section 360bbb-3(b)(1), unless the authorization is terminated or revoked.  Performed at [facility], [address] [place], [place] [address]   Blood Culture (routine x 2)     Status: None   Collection Time: 02/28/23 11:18 PM   Specimen: BLOOD  Result Value Ref Range Status   Specimen Description BLOOD BLOOD RIGHT ARM  Final   Special Requests   Final    BOTTLES DRAWN AEROBIC AND ANAEROBIC Blood Culture adequate volume   Culture   Final    NO GROWTH 5 DAYS Performed at [facility], [address] [place], [place] [address]    Report Status 03/05/2023 FINAL  Final  Blood Culture (routine x 2)     Status: None   Collection Time: 02/28/23 11:18 PM   Specimen: BLOOD  Result Value Ref Range Status   Specimen Description BLOOD BLOOD RIGHT ARM  Final   Special Requests   Final    BOTTLES DRAWN AEROBIC AND ANAEROBIC Blood Culture adequate volume   Culture   Final    NO GROWTH 5 DAYS Performed at [facility], [address]  [place], [place] [address]    Report Status 03/06/2023 FINAL  Final  Respiratory (~20 pathogens) panel by PCR     Status: Abnormal   Collection Time: 03/01/23  9:26 AM   Specimen: Nasopharyngeal Swab; Respiratory  Result Value Ref Range Status   Adenovirus NOT DETECTED NOT DETECTED Final   Coronavirus 229E NOT DETECTED NOT DETECTED Final    Comment: (NOTE) The Coronavirus on the Respiratory Panel, DOES NOT test for the novel  Coronavirus (2019 nCoV)    Coronavirus HKU1 NOT DETECTED NOT DETECTED Final   Coronavirus NL63 NOT DETECTED NOT DETECTED Final   Coronavirus OC43 NOT DETECTED NOT DETECTED Final   Metapneumovirus NOT DETECTED NOT DETECTED Final   Rhinovirus / Enterovirus NOT DETECTED NOT DETECTED Final   Influenza A NOT DETECTED NOT DETECTED Final   Influenza B NOT DETECTED NOT DETECTED Final   Parainfluenza Virus 1 NOT DETECTED NOT DETECTED Final   Parainfluenza Virus 2 NOT DETECTED NOT DETECTED Final   Parainfluenza Virus 3 DETECTED (A) NOT DETECTED Final   Parainfluenza Virus 4 NOT DETECTED NOT DETECTED Final   Respiratory Syncytial Virus NOT DETECTED NOT DETECTED Final   Bordetella pertussis NOT DETECTED NOT DETECTED Final   Bordetella Parapertussis NOT DETECTED NOT DETECTED Final   Chlamydophila pneumoniae NOT DETECTED NOT DETECTED Final   Mycoplasma pneumoniae NOT DETECTED NOT DETECTED Final    Comment: Performed at [facility], [address], [place], [place] [address]    Labs: CBC: Recent Labs  Lab 10/29/23 2303 10/31/23 0425 11/01/23 0847 11/02/23 0537  WBC 6.3 9.0 6.1 8.2  NEUTROABS 3.8  --   --   --   HGB 12.0 12.6 11.6* 11.9*  HCT 36.5 38.7 35.6* 36.2  MCV 102.5* 100.5* 100.3* 99.5  PLT 170 217 173 196   Basic Metabolic Panel: Recent Labs  Lab 10/29/23 2303 10/31/23 0425 11/01/23 0847 11/02/23 0537  NA 137 136 136 140  K 3.8 4.1 3.9 4.1  CL 108 107 106 107  CO2 22 21* 22 24  GLUCOSE 99 111* 226* 102*  BUN 24* 30* 27* 32*  CREATININE 1.09*  1.31* 1.31* 1.30*  CALCIUM 8.8*  8.9 8.8* 9.2  MG 2.3 2.2 2.2 2.4  PHOS 3.1 3.0 2.8 3.0   Liver Function Tests: No results for input(s): "AST", "ALT", "ALKPHOS", "BILITOT", "PROT", "ALBUMIN" in the last 168 hours. CBG: Recent Labs  Lab 11/02/23 1123 11/02/23 1700 11/02/23 1959 11/03/23 0805 11/03/23 1155  GLUCAP 131* 98 197* 85 124*    Discharge time spent: greater than 30 minutes.  Signed: Lurene Shadow, MD [facility] 11/03/2023

## 2023-11-04 ENCOUNTER — Telehealth: Payer: Self-pay

## 2023-11-04 ENCOUNTER — Telehealth: Payer: Self-pay | Admitting: Internal Medicine

## 2023-11-04 ENCOUNTER — Inpatient Hospital Stay: Payer: Medicare Other

## 2023-11-04 NOTE — Patient Outreach (Signed)
Care Management  Transitions of Care Program Transitions of Care Post-discharge Initial   11/04/2023 Name: Jocelyn Elliott MRN: [id number] DOB: May 18, 1932  Subjective: Jocelyn Elliott is a 89 y.o. year old female who is a primary care patient of Jocelyn [place], MD. The Care Management team Engaged with patient Engaged with patient by telephone to assess and address transitions of care needs.   Consent to Services:  Patient was given information about care management services, agreed to services, and gave verbal consent to participate.   Assessment:  Date of Discharge: 11/03/23 Discharge Facility: [facility] Jocelyn Elliott) Type of Discharge: Inpatient Admission Primary Inpatient Discharge Diagnosis:: CVA  SDOH Interventions    Flowsheet Row Telephone from 11/04/2023 in [facility] Clinical Support from 04/02/2023 in [facility] Jocelyn Elliott at [employer]  SDOH Interventions    Food Insecurity Interventions Intervention Not Indicated Intervention Not Indicated  Housing Interventions Intervention Not Indicated Intervention Not Indicated  Transportation Interventions Intervention Not Indicated Intervention Not Indicated  Utilities Interventions Intervention Not Indicated Intervention Not Indicated  Alcohol Usage Interventions -- Intervention Not Indicated (Score <7)  Financial Strain Interventions -- Intervention Not Indicated  Physical Activity Interventions -- Other (Comments)  Stress Interventions -- Intervention Not Indicated  Social Connections Interventions -- Intervention Not Indicated        Goals Addressed             This Visit's Progress    TOC Plan of Care       Current Barriers:  Knowledge Deficits related to plan of care for management of A-fib,CVA  Chronic Disease Management support and education needs related to Atrial Fibrillation   RNCM Clinical Goal(s):  Patient will work with the Care  Management team over the next 30 days to address Transition of Care Barriers: Medication access Medication Management Diet/Nutrition/Food Resources Support at home Provider appointments Home Health services Equipment/DME Functional/Safety verbalize basic understanding of  Atrial Fibrillation disease process and self health management plan as evidenced by follow through on recommendations of the team, no Elliott re-admits in 30 days  through collaboration with RN Care manager, provider, and care team.   Interventions: Evaluation of current treatment plan related to  self management and patient's adherence to plan as established by provider   AFIB Interventions: (Status:  New goal.) Short Term Goal   Counseled on increased risk of stroke due to Afib and benefits of anticoagulation for stroke prevention Reviewed importance of adherence to anticoagulant exactly as prescribed Counseled on bleeding risk associated with Apixaban and importance of self-monitoring for signs/symptoms of bleeding Screening for signs and symptoms of depression related to chronic disease state   Patient Goals/Self-Care Activities: Participate in Transition of Care Program/Attend [facility] scheduled calls Notify RN Care Manager of [facility] call rescheduling needs Take all medications as prescribed Attend all scheduled provider appointments Call pharmacy for medication refills 3-7 days in advance of running out of medications Perform all self care activities independently  Call provider office for new concerns or questions   Follow Up Plan:  Telephone follow up appointment with care management team member scheduled for:  Friday December 20th at 11am  Next PCP appointment scheduled for:  Wednesday December 18th        [facility] Outreach completed today. The patient's daughter answers the phone. She is tired and anxious about her mother being at home. She states her mother is also anxious and has not been sleeping well. Reviewed  the discharge instructions and the  medications. Scheduled the Elliott PCP appointment while on the phone. The daughter will call and schedule the Neurology appointment. Her mom is having some urinary frequency and is wearing a pad because of leakage. She states she is sleeping in the recliner. Her mother has been living alone and managing independently but her daughter lives next door and checks on her all day. Agreeable to the 30 day Outreach program.  Reviewed goals for care.  Patient verbalizes understanding of instructions and care plan provided. Patient was encouraged to make informed decisions about their care, actively participate in managing their health condition, and implement lifestyle changes as needed to promote independence and self-management of Elliott. Effectiveness of interventions, symptom management and outcomes will be evaluated weekly.   [name], RN Medical illustrator [facility] [phone]

## 2023-11-04 NOTE — Telephone Encounter (Signed)
LM for Jocelyn Elliott to return my call.

## 2023-11-04 NOTE — Telephone Encounter (Signed)
Pt daughter called stating she would like to talk to the provider about the pt being in the hospital

## 2023-11-06 ENCOUNTER — Ambulatory Visit: Payer: Medicare Other | Admitting: Internal Medicine

## 2023-11-06 ENCOUNTER — Encounter: Payer: Self-pay | Admitting: Internal Medicine

## 2023-11-06 VITALS — BP 120/64 | HR 72 | Temp 97.1°F | Ht 59.0 in | Wt 135.6 lb

## 2023-11-06 DIAGNOSIS — D649 Anemia, unspecified: Secondary | ICD-10-CM | POA: Diagnosis not present

## 2023-11-06 DIAGNOSIS — I10 Essential (primary) hypertension: Secondary | ICD-10-CM | POA: Diagnosis not present

## 2023-11-06 DIAGNOSIS — I480 Paroxysmal atrial fibrillation: Secondary | ICD-10-CM

## 2023-11-06 DIAGNOSIS — I2510 Atherosclerotic heart disease of native coronary artery without angina pectoris: Secondary | ICD-10-CM

## 2023-11-06 DIAGNOSIS — I69398 Other sequelae of cerebral infarction: Secondary | ICD-10-CM

## 2023-11-06 DIAGNOSIS — I779 Disorder of arteries and arterioles, unspecified: Secondary | ICD-10-CM

## 2023-11-06 DIAGNOSIS — H5340 Unspecified visual field defects: Secondary | ICD-10-CM

## 2023-11-06 DIAGNOSIS — N1832 Chronic kidney disease, stage 3b: Secondary | ICD-10-CM

## 2023-11-06 DIAGNOSIS — E7800 Pure hypercholesterolemia, unspecified: Secondary | ICD-10-CM | POA: Diagnosis not present

## 2023-11-06 DIAGNOSIS — E1159 Type 2 diabetes mellitus with other circulatory complications: Secondary | ICD-10-CM

## 2023-11-06 DIAGNOSIS — D696 Thrombocytopenia, unspecified: Secondary | ICD-10-CM

## 2023-11-06 DIAGNOSIS — I700 Atherosclerosis of aorta: Secondary | ICD-10-CM

## 2023-11-06 DIAGNOSIS — Z8673 Personal history of transient ischemic attack (TIA), and cerebral infarction without residual deficits: Secondary | ICD-10-CM

## 2023-11-06 LAB — CBC WITH DIFFERENTIAL/PLATELET
Basophils Absolute: 0.1 10*3/uL (ref 0.0–0.1)
Basophils Relative: 1.1 % (ref 0.0–3.0)
Eosinophils Absolute: 0.2 10*3/uL (ref 0.0–0.7)
Eosinophils Relative: 2.6 % (ref 0.0–5.0)
HCT: 37.7 % (ref 36.0–46.0)
Hemoglobin: 12.3 g/dL (ref 12.0–15.0)
Lymphocytes Relative: 19.5 % (ref 12.0–46.0)
Lymphs Abs: 1.5 10*3/uL (ref 0.7–4.0)
MCHC: 32.7 g/dL (ref 30.0–36.0)
MCV: 101.8 fL — ABNORMAL HIGH (ref 78.0–100.0)
Monocytes Absolute: 0.6 10*3/uL (ref 0.1–1.0)
Monocytes Relative: 7.4 % (ref 3.0–12.0)
Neutro Abs: 5.3 10*3/uL (ref 1.4–7.7)
Neutrophils Relative %: 69.4 % (ref 43.0–77.0)
Platelets: 210 10*3/uL (ref 150.0–400.0)
RBC: 3.7 Mil/uL — ABNORMAL LOW (ref 3.87–5.11)
RDW: 15 % (ref 11.5–15.5)
WBC: 7.6 10*3/uL (ref 4.0–10.5)

## 2023-11-06 LAB — HEPATIC FUNCTION PANEL
ALT: 26 U/L (ref 0–35)
AST: 45 U/L — ABNORMAL HIGH (ref 0–37)
Albumin: 3.8 g/dL (ref 3.5–5.2)
Alkaline Phosphatase: 56 U/L (ref 39–117)
Bilirubin, Direct: 0.2 mg/dL (ref 0.0–0.3)
Total Bilirubin: 0.6 mg/dL (ref 0.2–1.2)
Total Protein: 6.7 g/dL (ref 6.0–8.3)

## 2023-11-06 LAB — BASIC METABOLIC PANEL
BUN: 25 mg/dL — ABNORMAL HIGH (ref 6–23)
CO2: 28 meq/L (ref 19–32)
Calcium: 9.2 mg/dL (ref 8.4–10.5)
Chloride: 107 meq/L (ref 96–112)
Creatinine, Ser: 1.19 mg/dL (ref 0.40–1.20)
GFR: 40.57 mL/min — ABNORMAL LOW (ref >60.00)
Glucose, Bld: 105 mg/dL — ABNORMAL HIGH (ref 70–99)
Potassium: 4.5 meq/L (ref 3.5–5.1)
Sodium: 142 meq/L (ref 135–145)

## 2023-11-06 LAB — IBC + FERRITIN
Ferritin: 26.6 ng/mL (ref 10.0–291.0)
Iron: 63 ug/dL (ref 42–145)
Saturation Ratios: 15.8 % — ABNORMAL LOW (ref 20.0–50.0)
TIBC: 397.6 ug/dL (ref 250.0–450.0)
Transferrin: 284 mg/dL (ref 212.0–360.0)

## 2023-11-06 NOTE — Progress Notes (Signed)
Subjective:    Patient ID: Jocelyn Elliott, female    DOB: 1932-04-19, 89 y.o.   MRN: [id number]  Patient here for  Chief Complaint  Patient presents with   Hospitalization Follow-up    Pt. Had a stroke. Right side weakness. Vision disturbance, anxiety, Low BP post Stroke.     HPI Here for hospital follow up. She is accompanied by her daughter Jocelyn Elliott). History obtained from both of them. Admitted 10/29/23 - 11/03/23 - after presenting with confusion, difficulty with speech, unsteady gait and blurred vision. Per review - acute stroke - acute infarct in the left thalamus, periatrial and medial temporal areas. Recommended eliquis. PT recommended home with home health. Continues on midodrine. CTA - No hemorrhage or CT evidence of an acute cortical infarct. No intracranial large vessel occlusion. Approximately 60% stenosis of the origin of the left ICA secondary to soft atherosclerotic plaque. Moderate to severe stenosis of the origin of the left vertebral artery secondary to calcified atherosclerotic plaque. Somewhat beaded and irregular appearance of the distal left common carotid artery and proximal left ICA, as can be seen in the setting of FMD. Since being home, she reports she is doing better. Still with right visual field deficit. Arm and leg doing better. Walking. Planning for home health PT/OT and ST if needed. No chest pain.  Breathing stable. Back on blood thinner. Concerned about being back on, given recurring h/o GI bleeding. No blood noticed.    Past Medical History:  Diagnosis Date   Arthritis    AV malformation of gastrointestinal tract    Basal cell carcinoma 08/07/2023   Central upper forehead. Nodular pattern. Mohs 09/09/23   Blood in stool    CAD (coronary artery disease)    Carotid arterial disease (HCC)    Cataracts, bilateral    Chronic blood loss anemia    Chronic cystitis    CKD (chronic kidney disease), stage III (HCC)    Complication of anesthesia    reaction to  propofol - caused heart to pause   Depression    Diabetes mellitus (HCC)    GERD (gastroesophageal reflux disease)    Hyperlipidemia    Hypertension    IDA (iron deficiency anemia)    Stress incontinence    TIA (transient ischemic attack) 03/2021   No deficits   Wears dentures    full upper and lower   Past Surgical History:  Procedure Laterality Date   ABDOMINAL HYSTERECTOMY  11/19/1970   ovaries left in place   APPENDECTOMY  11/19/1990   BACK SURGERY  06/08/2010   L3, L4, L5   CARDIAC CATHETERIZATION  02/2001   CAROTID ARTERY ANGIOPLASTY  09/19/2002   CARPAL TUNNEL RELEASE  1991, 92   right then left    CATARACT EXTRACTION W/PHACO Left 02/13/2022   Procedure: CATARACT EXTRACTION PHACO AND INTRAOCULAR LENS PLACEMENT (IOC) LEFT DIABETIC 14.90 01:26.8;  Surgeon: Galen Manila, MD;  Location: [facility];  Service: Ophthalmology;  Laterality: Left;  Diabetic   CATARACT EXTRACTION W/PHACO Right 03/06/2022   Procedure: CATARACT EXTRACTION PHACO AND INTRAOCULAR LENS PLACEMENT (IOC) RIGHT DIABETIC 9.66 00:53.7;  Surgeon: Galen Manila, MD;  Location: [facility];  Service: Ophthalmology;  Laterality: Right;  Diabetic   CHOLECYSTECTOMY  11/20/1991   COLONOSCOPY WITH PROPOFOL N/A 02/02/2019   Procedure: COLONOSCOPY WITH PROPOFOL;  Surgeon: Scot Jun, MD;  Location: [facility] END[facility];  Service: Endoscopy;  Laterality: N/A;   ESOPHAGOGASTRODUODENOSCOPY N/A 02/02/2019   Procedure: ESOPHAGOGASTRODUODENOSCOPY (EGD);  Surgeon: Lynnae Prude  T, MD;  Location: [facility] ENDOSCOPY;  Service: Endoscopy;  Laterality: N/A;   FOOT SURGERY  06/20/2007   Left   right carotid artery surgery  01/09/2013   Dr. Gilda Crease @ [facility]   TOTAL HIP ARTHROPLASTY  05/03/2011   left 12, right 11/07   Family History  Problem Relation Age of Onset   Hyperlipidemia Father    Heart disease Father        myocardial infarction   Hypertension Father        Parent   Arthritis Other         parent   Diabetes Other        nephew   Cervical cancer Sister    Rectal cancer Sister    Breast cancer Neg Hx    Social History   Socioeconomic History   Marital status: Widowed    Spouse name: Not on file   Number of children: 1   Years of education: 81   Highest education level: Not on file  Occupational History   Occupation: Retired  Tobacco Use   Smoking status: Former    Current packs/day: 0.00    Types: Cigarettes    Quit date: 11/19/1989    Years since quitting: 33.9   Smokeless tobacco: Never  Vaping Use   Vaping status: Never Used  Substance and Sexual Activity   Alcohol use: No    Alcohol/week: 0.0 standard drinks of alcohol   Drug use: No   Sexual activity: Not Currently  Other Topics Concern   Not on file  Social History Narrative   Regular exercise-no   Caffeine Use-yes   Social Drivers of Health   Financial Resource Strain: Low Risk  (04/02/2023)   Overall Financial Resource Strain (CARDIA)    Difficulty of Paying Living Expenses: Not hard at all  Food Insecurity: No Food Insecurity (11/04/2023)   Hunger Vital Sign    Worried About Running Out of Food in the Last Year: Never true    Ran Out of Food in the Last Year: Never true  Transportation Needs: No Transportation Needs (11/04/2023)   PRAPARE - Administrator, Civil Service (Medical): No    Lack of Transportation (Non-Medical): No  Physical Activity: Insufficiently Active (04/02/2023)   Exercise Vital Sign    Days of Exercise per Week: 7 days    Minutes of Exercise per Session: 20 min  Stress: No Stress Concern Present (04/02/2023)   [employer] of Occupational Health - Occupational Stress Questionnaire    Feeling of Stress : Not at all  Social Connections: Socially Integrated (04/02/2023)   Social Connection and Isolation Panel [NHANES]    Frequency of Communication with Friends and Family: More than three times a week    Frequency of Social Gatherings with Friends and Family:  More than three times a week    Attends Religious Services: More than 4 times per year    Active Member of [employer] or Organizations: Yes    Attends Engineer, structural: More than 4 times per year    Marital Status: Married     Review of Systems  Constitutional:  Negative for appetite change and unexpected weight change.  HENT:  Negative for congestion and sinus pressure.   Eyes:  Positive for visual disturbance.  Respiratory:  Negative for cough and chest tightness.        Breathing stable.   Cardiovascular:  Negative for chest pain and palpitations.  Gastrointestinal:  Negative for abdominal pain, diarrhea,  nausea and vomiting.  Genitourinary:  Negative for difficulty urinating and dysuria.  Musculoskeletal:  Negative for joint swelling and myalgias.  Skin:  Negative for color change and rash.  Neurological:  Negative for dizziness and headaches.  Psychiatric/Behavioral:  Negative for agitation and dysphoric mood.        Objective:     BP 120/64   Pulse 72   Temp (!) 97.1 F (36.2 C)   Ht 4\' 11"  (1.499 m)   Wt 135 lb 9.6 oz (61.5 kg)   SpO2 96%   BMI 27.39 kg/m  Wt Readings from Last 3 Encounters:  11/06/23 135 lb 9.6 oz (61.5 kg)  10/29/23 138 lb (62.6 kg)  10/07/23 140 lb (63.5 kg)    Physical Exam Vitals reviewed.  Constitutional:      General: She is not in acute distress.    Appearance: Normal appearance.  HENT:     Head: Normocephalic and atraumatic.     Right Ear: External ear normal.     Left Ear: External ear normal.     Mouth/Throat:     Pharynx: No oropharyngeal exudate or posterior oropharyngeal erythema.  Eyes:     General: No scleral icterus.       Right eye: No discharge.        Left eye: No discharge.     Conjunctiva/sclera: Conjunctivae normal.  Neck:     Thyroid: No thyromegaly.  Cardiovascular:     Rate and Rhythm: Normal rate.     Comments: Rate controlled.  Pulmonary:     Effort: No respiratory distress.     Breath sounds:  Normal breath sounds. No wheezing.  Abdominal:     General: Bowel sounds are normal.     Palpations: Abdomen is soft.     Tenderness: There is no abdominal tenderness.  Musculoskeletal:        General: No swelling or tenderness.     Cervical back: Neck supple. No tenderness.  Lymphadenopathy:     Cervical: No cervical adenopathy.  Skin:    Findings: No erythema or rash.  Neurological:     Mental Status: She is alert and oriented to person, place, and time.  Psychiatric:        Mood and Affect: Mood normal.        Behavior: Behavior normal.      Outpatient Encounter Medications as of 11/06/2023  Medication Sig   apixaban (ELIQUIS) 2.5 MG TABS tablet Take 1 tablet (2.5 mg total) by mouth 2 (two) times daily.   Biotin 5000 MCG CAPS Take 1 capsule by mouth daily.   cetirizine (ZYRTEC) 10 MG tablet Take 10 mg by mouth daily.   docusate sodium (COLACE) 100 MG capsule Take 100 mg by mouth daily as needed for mild constipation.   ferrous sulfate 325 (65 FE) MG EC tablet Take 325 mg by mouth every other day. Every other day   finasteride (PROSCAR) 5 MG tablet Take 5 mg by mouth daily.   hydrocortisone 2.5 % cream apply topically 2 times daily as needed for itch at right lower leg   ketoconazole (NIZORAL) 2 % shampoo MASSAGE INTO SCALP AND LET SIT SEVERAL MINUTES BEFORE RINSING. USE 2-3 TIMES A WEEK.   Lancet Device MISC Use as directed to check blood sugars   Lancets (ONETOUCH DELICA PLUS LANCET30G) MISC USE AS INSTRUCTED TO CHECK BLOOD SUGARS ONCE DAILY. DX E 11.9   midodrine (PROAMATINE) 5 MG tablet Take 1 tablet (5 mg total) by mouth 2 (  two) times daily with a meal.   Omega-3 Fatty Acids (FISH OIL) 1200 MG CAPS Take 1 capsule by mouth daily.   Probiotic, Lactobacillus, CAPS Take 1 capsule by mouth daily.   rosuvastatin (CRESTOR) 40 MG tablet TAKE 1 TABLET BY MOUTH EVERY DAY   traZODone (DESYREL) 50 MG tablet TAKE 1/2 TO 1 TABLET BY MOUTH AT BEDTIME AS NEEDED FOR SLEEP (Patient taking  differently: Take 50 mg by mouth at bedtime as needed for sleep. for sleep)   Vitamin D, Ergocalciferol, (DRISDOL) 1.25 MG (50000 UNIT) CAPS capsule Take 1 capsule (50,000 Units total) by mouth every 7 (seven) days.   No facility-administered encounter medications on file as of 11/06/2023.     Lab Results  Component Value Date   WBC 7.6 11/06/2023   HGB 12.3 11/06/2023   HCT 37.7 11/06/2023   PLT 210.0 11/06/2023   GLUCOSE 105 (H) 11/06/2023   CHOL 128 10/29/2023   TRIG 110 10/29/2023   HDL 60 10/29/2023   LDLDIRECT 51.0 04/14/2019   LDLCALC 46 10/29/2023   ALT 26 11/06/2023   AST 45 (H) 11/06/2023   NA 142 11/06/2023   K 4.5 11/06/2023   CL 107 11/06/2023   CREATININE 1.19 11/06/2023   BUN 25 (H) 11/06/2023   CO2 28 11/06/2023   TSH 2.134 10/29/2023   INR 1.2 02/28/2023   HGBA1C 4.9 07/08/2023   MICROALBUR 12.7 (H) 07/16/2022    ECHOCARDIOGRAM COMPLETE Result Date: 10/31/2023    ECHOCARDIOGRAM REPORT   Patient Name:   MASIELA KNAUFF Date of Exam: 10/30/2023 Medical Rec #:  [id number]        Height:       59.0 in Accession #:    [id number]       Weight:       138.0 lb Date of Birth:  Oct 28, 1932        BSA:          1.575 m Patient Age:    89 years         BP:           136/68 mmHg Patient Gender: F                HR:           89 bpm. Exam Location:  [facility] Procedure: 2D Echo, Cardiac Doppler and Color Doppler Indications:     I63.9 Stroke  History:         Patient has no prior history of Echocardiogram examinations.                  CAD, TIA; Risk Factors:Hypertension, Diabetes and Dyslipidemia.  Sonographer:     Daphine Deutscher RDCS Referring Phys:  [id number] ASHISH ARORA Diagnosing Phys: Alwyn Pea MD IMPRESSIONS  1. Left ventricular ejection fraction, by estimation, is 55 to 60%. The left ventricle has normal function. The left ventricle has no regional wall motion abnormalities. Left ventricular diastolic parameters are consistent with Grade III diastolic dysfunction  (restrictive).  2. Right ventricular systolic function is normal. The right ventricular size is normal.  3. Left atrial size was moderately dilated.  4. Right atrial size was mild to moderately dilated.  5. The mitral valve is abnormal. Moderate mitral valve regurgitation. Moderate to severe mitral annular calcification.  6. The aortic valve is calcified. Aortic valve regurgitation is mild. Mild aortic valve stenosis. FINDINGS  Left Ventricle: Left ventricular ejection fraction, by estimation, is 55 to 60%. The left ventricle has  normal function. The left ventricle has no regional wall motion abnormalities. The left ventricular internal cavity size was normal in size. There is  no left ventricular hypertrophy. Left ventricular diastolic parameters are consistent with Grade III diastolic dysfunction (restrictive). Right Ventricle: The right ventricular size is normal. No increase in right ventricular wall thickness. Right ventricular systolic function is normal. Left Atrium: Left atrial size was moderately dilated. Right Atrium: Right atrial size was mild to moderately dilated. Pericardium: There is no evidence of pericardial effusion. The mitral valve is abnormal. There is moderate thickening of the mitral valve leaflet(s). There is severe calcification of the mitral valve leaflet(s). Mildly decreased mobility of the mitral valve leaflets. Moderate to severe mitral annular calcification. Moderate mitral valve regurgitation. Tricuspid Valve: The tricuspid valve is grossly normal. Tricuspid valve regurgitation is mild. The aortic valve is calcified. Aortic valve regurgitation is mild. Mild aortic stenosis is present. Pulmonic Valve: The pulmonic valve was normal in structure. Pulmonic valve regurgitation is not visualized. Aorta: The ascending aorta was not well visualized. IAS/Shunts: No atrial level shunt detected by color flow Doppler.  LEFT VENTRICLE PLAX 2D LVIDd:         4.70 cm LVIDs:         3.30 cm LV PW:          0.90 cm LV IVS:        0.70 cm LVOT diam:     1.60 cm LV SV:         57 LV SV Index:   36 LVOT Area:     2.01 cm  RIGHT VENTRICLE            IVC RV Basal diam:  3.20 cm    IVC diam: 1.30 cm RV S prime:     9.99 cm/s TAPSE (M-mode): 1.6 cm LEFT ATRIUM             Index        RIGHT ATRIUM          Index LA diam:        5.00 cm 3.17 cm/m   RA Area:     9.03 cm LA Vol (A2C):   41.2 ml 26.15 ml/m  RA Volume:   19.70 ml 12.51 ml/m LA Vol (A4C):   32.6 ml 20.69 ml/m LA Biplane Vol: 38.6 ml 24.50 ml/m  AORTIC VALVE AV Area (Vmax):    1.44 cm AV Area (Vmean):   1.41 cm AV Area (VTI):     1.43 cm AV Vmax:           215.60 cm/s AV Vmean:          151.000 cm/s AV VTI:            0.399 m AV Peak Grad:      18.6 mmHg AV Mean Grad:      10.6 mmHg LVOT Vmax:         154.67 cm/s LVOT Vmean:        105.667 cm/s LVOT VTI:          0.284 m LVOT/AV VTI ratio: 0.71  AORTA Ao Root diam: 3.10 cm Ao Asc diam:  3.50 cm MITRAL VALVE                TRICUSPID VALVE MV Area (PHT): 4.74 cm     TR Peak grad:   46.2 mmHg MV Area VTI:   1.51 cm     TR Vmax:  340.00 cm/s MV Peak grad:  16.5 mmHg MV Mean grad:  7.0 mmHg     SHUNTS MV Vmax:       2.03 m/s     Systemic VTI:  0.28 m MV Vmean:      120.5 cm/s   Systemic Diam: 1.60 cm MV Decel Time: 160 msec MV E velocity: 168.00 cm/s MV A velocity: 63.40 cm/s MV E/A ratio:  2.65 Alwyn Pea MD Electronically signed by Alwyn Pea MD Signature Date/Time: 10/31/2023/4:09:35 PM    Final    CT ANGIO HEAD NECK W WO CM Result Date: 10/30/2023 CLINICAL DATA:  Neuro deficit, acute, stroke suspected EXAM: CT ANGIOGRAPHY HEAD AND NECK WITH AND WITHOUT CONTRAST TECHNIQUE: Multidetector CT imaging of the head and neck was performed using the standard protocol during bolus administration of intravenous contrast. Multiplanar CT image reconstructions and MIPs were obtained to evaluate the vascular anatomy. Carotid stenosis measurements (when applicable) are obtained utilizing  NASCET criteria, using the distal internal carotid diameter as the denominator. RADIATION DOSE REDUCTION: This exam was performed according to the departmental dose-optimization program which includes automated exposure control, adjustment of the mA and/or kV according to patient size and/or use of iterative reconstruction technique. CONTRAST:  75mL OMNIPAQUE IOHEXOL 350 MG/ML SOLN COMPARISON:  None Available. FINDINGS: CT HEAD FINDINGS Brain: No hemorrhage. No hydrocephalus. No extra-axial fluid collection. There is a chronic left superior cerebellar infarct. No CT evidence of an acute cortical infarct. Mass effect. No mass lesion. There is a background of moderate chronic microvascular ischemic change. Vascular: No hyperdense vessel or unexpected calcification. Skull: Normal. Negative for fracture or focal lesion. Sinuses/Orbits: No middle ear or mastoid effusion. Paranasal sinuses are clear. Bilateral lens replacement. Orbits are otherwise unremarkable. Other: None. Review of the MIP images confirms the above findings CTA NECK FINDINGS Aortic arch: Standard branching. Imaged portion shows no evidence of aneurysm or dissection. Atherosclerotic calcifications of the aortic arch. Moderate narrowing of the origin of the left subclavian artery likely the brachiocephalic artery. Right carotid system: No evidence of dissection, stenosis (50% or greater), or occlusion. Mild narrowing in the cavernous segment of the right ICA Left carotid system: No evidence of dissection or occlusion. Approximately 60% stenosis of the origin of the left ICA secondary to soft atherosclerotic plaque. Somewhat beaded and irregular appearance of the distal left common carotid artery, as can be seen in the setting of FMD. Vertebral arteries: Right dominant. Moderate to severe stenosis of the origin of the left vertebral artery secondary to calcified atherosclerotic plaque no evidence of dissection, stenosis (50% or greater), or occlusion.  Skeleton: Grade 1 anterolisthesis of C3 on C4 and C4 on C5 Other neck: Negative. Upper chest: Negative. Review of the MIP images confirms the above findings CTA HEAD FINDINGS Anterior circulation: No significant stenosis, proximal occlusion, aneurysm, or vascular malformation. Posterior circulation: No significant stenosis, proximal occlusion, aneurysm, or vascular malformation. Moderate narrowing in the P2 segment of the left PCA Venous sinuses: As permitted by contrast timing, patent. Anatomic variants: No Review of the MIP images confirms the above findings IMPRESSION: 1. No hemorrhage or CT evidence of an acute cortical infarct. 2. No intracranial large vessel occlusion. 3. Approximately 60% stenosis of the origin of the left ICA secondary to soft atherosclerotic plaque. 4. Moderate to severe stenosis of the origin of the left vertebral artery secondary to calcified atherosclerotic plaque. 5. Somewhat beaded and irregular appearance of the distal left common carotid artery and proximal left ICA, as can  be seen in the setting of FMD. Aortic Atherosclerosis (ICD10-I70.0). Electronically Signed   By: Lorenza Cambridge M.D.   On: 10/30/2023 11:18   MR BRAIN WO CONTRAST Result Date: 10/30/2023 CLINICAL DATA:  Onset of right-sided weakness and confusion with cysts speech difficulty today. EXAM: MRI HEAD WITHOUT CONTRAST TECHNIQUE: Multiplanar, multiecho pulse sequences of the brain and surrounding structures were obtained without intravenous contrast. COMPARISON:  Head CT from yesterday. FINDINGS: Brain: Band of restricted diffusion at the upper and lateral left thalamus extending laterally to the periatrial white matter and medial temporal cortex. There is a background of chronic small vessel ischemic gliosis in the deep cerebral white matter. Chronic left cerebellar infarction. No evidence of hemorrhage, hydrocephalus, mass, or collection. Vascular: Grossly preserved major flow voids by axial T2 weighted imaging.  Skull and upper cervical spine: No gross marrow lesion Sinuses/Orbits: Negative Other: Progressive severe motion artifact with numerous nondiagnostic sequences. The scan was truncated before MRA and coronal T2 weighted imaging. IMPRESSION: Very motion degraded and truncated brain MRI but still positive for acute infarct in the left thalamus capsular, periatrial, and medial temporal areas (thalamoperforator versus anterior choroidal infarction). Electronically Signed   By: Tiburcio Pea M.D.   On: 10/30/2023 04:39       Assessment & Plan:  Hypercholesteremia Assessment & Plan: Continue crestor.  Low cholesterol diet and exercise.  Follow lipid panel and liver function tests.    Orders: -     Hepatic function panel  Primary hypertension Assessment & Plan: Blood pressure as outlined. Follow pressures.  Currently on midodrine to keep blood pressure at a good level. Doing better.  Follow.   Orders: -     Basic metabolic panel  Anemia, unspecified type Assessment & Plan: Followed by hematology.  History of AVM.  Also with CKD.  Receiving IV iron.  Followed by Dr Donneta Romberg.  On eliquis now.  Will need closely monitor for any bleeding.  Follow hgb.  Check cbc today.   Orders: -     CBC with Differential/Platelet -     IBC + Ferritin  Aortic atherosclerosis (HCC) Assessment & Plan: Continue crestor.      Coronary artery disease involving native coronary artery of native heart without angina pectoris Assessment & Plan: Remains on crestor.  Currently without symptoms. Continue risk factor modification.    Carotid artery disease, unspecified laterality, unspecified type (HCC) Assessment & Plan:  Continue antiplatelet therapy as prescribed Continue management of CAD, HTN and Hyperlipidemia Evaluated [facility] 08/26/23 -ultrasound shows RICA 1-39% and LICA 40-59% stenosis bilaterally s/p left CEA.  Recommended f/u in 12 months.  Recent CTA (during admission) as outlined.    Type 2 diabetes  mellitus with other circulatory complication, without long-term current use of insulin (HCC) Assessment & Plan: Watches diet.   Lab Results  Component Value Date   HGBA1C 4.9 07/08/2023      Paroxysmal atrial fibrillation [facility]) Assessment & Plan: Now on eliquis after recent admission - CVA. Heart rate controlled. Denies chest pain or sob. No increased heart rate or palpitations. Make sure has f/u with cardiology.    Stage 3b chronic kidney disease (HCC) Assessment & Plan: Continue to avoid antiinflammatories.  Stay hydrated.  Follow metabolic panel. Off ace inhibitor due to low blood pressure.    Thrombocytopenia (HCC) Assessment & Plan: Follow cbc.  Being followed by hematology.    History of CVA (cerebrovascular accident) Assessment & Plan: Recent admission as outlined.  W/up as outlined. On eliquis. Continue crestor.  Still with residual right visual field deficit.  Plan for home health PT/OT and ST.    Visual field defect Assessment & Plan: Persistent right visual field defect - recent CVA.  On eliquis.  Continues crestor.  Needs f/u with ophthalmology.       Dale [place], MD

## 2023-11-07 ENCOUNTER — Other Ambulatory Visit: Payer: Self-pay

## 2023-11-07 ENCOUNTER — Telehealth: Payer: Self-pay

## 2023-11-07 DIAGNOSIS — E7800 Pure hypercholesterolemia, unspecified: Secondary | ICD-10-CM

## 2023-11-07 NOTE — Telephone Encounter (Signed)
Specimen tracking and history updated from [facility] progress notes of superior mid forehead. aw

## 2023-11-07 NOTE — Telephone Encounter (Signed)
Called and spoke with [name], daughter and patient concerning lab results.  Dr. Lorin Picket would like to have pt return in 2-3 weeks for repeat liver panel.  Unable to make appointment at this time, [name] will call back to make the appointment.  Lab order entered for future visit.

## 2023-11-07 NOTE — Telephone Encounter (Signed)
-----   Message from Amery sent at 11/07/2023  5:36 AM EST ----- Please call and notify [name] (daughter) that Ms Jocelyn Elliott hgb is wnl. Kidney function has improved. One liver test slightly elevated.  We will follow. Recommend f/u liver panel in 2-3 weeks.

## 2023-11-08 ENCOUNTER — Telehealth: Payer: Self-pay

## 2023-11-08 ENCOUNTER — Other Ambulatory Visit: Payer: Self-pay

## 2023-11-08 NOTE — Patient Outreach (Signed)
Care Management  Transitions of Care Program Transitions of Care Post-discharge week 2   11/08/2023 Name: Jocelyn Elliott MRN: [id number] DOB: 09/13/1932  Subjective: Jocelyn Elliott is a 89 y.o. year old female who is a primary care patient of Dale Lupton, MD. The Care Management team Engaged with patient Engaged with patient by telephone to assess and address transitions of care needs.   Consent to Services:  Patient was given information about care management services, agreed to services, and gave verbal consent to participate.   Assessment: TOC Outreach to the patient and her daughter today. The patient had her follow up visit with the PCP this week. No changes in treatment. HHPT and OT are visiting today. The patient is independent with ADL's. She is able to get to the bathroom by herself. Her sleep is improving. Her daughter is staying with her at night. No concerns. The patient and her daughter would like to discharge from the 30 day Outreach program.           SDOH Interventions    Flowsheet Row Telephone from 11/04/2023 in [place] [facility] Clinical Support from 04/02/2023 in [facility] [employer]  SDOH Interventions    Food Insecurity Interventions Intervention Not Indicated Intervention Not Indicated  Housing Interventions Intervention Not Indicated Intervention Not Indicated  Transportation Interventions Intervention Not Indicated Intervention Not Indicated  Utilities Interventions Intervention Not Indicated Intervention Not Indicated  Alcohol Usage Interventions -- Intervention Not Indicated (Score <7)  Financial Strain Interventions -- Intervention Not Indicated  Physical Activity Interventions -- Other (Comments)  Stress Interventions -- Intervention Not Indicated  Social Connections Interventions -- Intervention Not Indicated        Goals Addressed             This Visit's Progress    COMPLETED: TOC  Plan of Care       Current Barriers:  Knowledge Deficits related to plan of care for management of A-fib,CVA  Chronic Disease Management support and education needs related to Atrial Fibrillation   RNCM Clinical Goal(s):  Patient will work with the Care Management team over the next 30 days to address Transition of Care Barriers: Medication access Medication Management Diet/Nutrition/Food Resources Support at home Provider appointments Home Health services Equipment/DME Functional/Safety verbalize basic understanding of  Atrial Fibrillation disease process and self health management plan as evidenced by follow through on recommendations of the team, no hospital re-admits in 30 days  through collaboration with RN Care manager, provider, and care team.   Interventions: Evaluation of current treatment plan related to  self management and patient's adherence to plan as established by provider   AFIB Interventions: (Status:  New goal.) Short Term Goal   Counseled on increased risk of stroke due to Afib and benefits of anticoagulation for stroke prevention Reviewed importance of adherence to anticoagulant exactly as prescribed Counseled on bleeding risk associated with Apixaban and importance of self-monitoring for signs/symptoms of bleeding Screening for signs and symptoms of depression related to chronic disease state   Patient Goals/Self-Care Activities: Participate in Transition of Care Program/Attend [facility] scheduled calls Notify RN Care Manager of TOC call rescheduling needs Take all medications as prescribed Attend all scheduled provider appointments Call pharmacy for medication refills 3-7 days in advance of running out of medications Perform all self care activities independently  Call provider office for new concerns or questions   Follow Up Plan:  Discharge from [facility] program  The patient has been provided with contact information for the care management team and has been  advised to call with any health-related questions or concerns. The patient verbalized understanding with current POC. The patient is directed to their insurance card regarding availability of benefits coverage.   [name], RN Medical illustrator [facility] [phone]

## 2023-11-08 NOTE — Patient Instructions (Signed)
Visit Information  Thank you for taking time to visit with me today. Please don't hesitate to contact me if I can be of assistance to you before our next scheduled telephone appointment.  Following is a copy of your care plan:   Goals Addressed             This Visit's Progress    COMPLETED: TOC Plan of Care       Current Barriers:  Knowledge Deficits related to plan of care for management of A-fib,CVA  Chronic Disease Management support and education needs related to Atrial Fibrillation   RNCM Clinical Goal(s):  Patient will work with the Care Management team over the next 30 days to address Transition of Care Barriers: Medication access Medication Management Diet/Nutrition/Food Resources Support at home Provider appointments Home Health services Equipment/DME Functional/Safety verbalize basic understanding of  Atrial Fibrillation disease process and self health management plan as evidenced by follow through on recommendations of the team, no hospital re-admits in 30 days  through collaboration with RN Care manager, provider, and care team.   Interventions: Evaluation of current treatment plan related to  self management and patient's adherence to plan as established by provider   AFIB Interventions: (Status:  New goal.) Short Term Goal   Counseled on increased risk of stroke due to Afib and benefits of anticoagulation for stroke prevention Reviewed importance of adherence to anticoagulant exactly as prescribed Counseled on bleeding risk associated with Apixaban and importance of self-monitoring for signs/symptoms of bleeding Screening for signs and symptoms of depression related to chronic disease state   Patient Goals/Self-Care Activities: Participate in Transition of Care Program/Attend [facility] scheduled calls Notify RN Care Manager of TOC call rescheduling needs Take all medications as prescribed Attend all scheduled provider appointments Call pharmacy for medication  refills 3-7 days in advance of running out of medications Perform all self care activities independently  Call provider office for new concerns or questions   Follow Up Plan:  Discharge from [facility] program        Patient verbalizes understanding of instructions and care plan provided today and agrees to view in MyChart. Active MyChart status and patient understanding of how to access instructions and care plan via MyChart confirmed with patient.     [name], RN Medical illustrator [facility] [phone]

## 2023-11-10 ENCOUNTER — Telehealth: Payer: Self-pay | Admitting: Internal Medicine

## 2023-11-10 ENCOUNTER — Encounter: Payer: Self-pay | Admitting: Internal Medicine

## 2023-11-10 DIAGNOSIS — H5340 Unspecified visual field defects: Secondary | ICD-10-CM

## 2023-11-10 DIAGNOSIS — Z8673 Personal history of transient ischemic attack (TIA), and cerebral infarction without residual deficits: Secondary | ICD-10-CM

## 2023-11-10 NOTE — Assessment & Plan Note (Signed)
Persistent right visual field defect - recent CVA.  On eliquis.  Continues crestor.  Needs f/u with ophthalmology.

## 2023-11-10 NOTE — Assessment & Plan Note (Signed)
Continue crestor.  Low cholesterol diet and exercise.  Follow lipid panel and liver function tests.  

## 2023-11-10 NOTE — Assessment & Plan Note (Signed)
Continue to avoid antiinflammatories.  Stay hydrated.  Follow metabolic panel. Off ace inhibitor due to low blood pressure.

## 2023-11-10 NOTE — Assessment & Plan Note (Signed)
Remains on crestor.  Currently without symptoms. Continue risk factor modification.

## 2023-11-10 NOTE — Assessment & Plan Note (Signed)
Continue antiplatelet therapy as prescribed Continue management of CAD, HTN and Hyperlipidemia Evaluated [facility] 08/26/23 -ultrasound shows RICA 1-39% and LICA 40-59% stenosis bilaterally s/p left CEA.  Recommended f/u in 12 months.  Recent CTA (during admission) as outlined.

## 2023-11-10 NOTE — Assessment & Plan Note (Signed)
Follow cbc.  Being followed by hematology.

## 2023-11-10 NOTE — Assessment & Plan Note (Signed)
Blood pressure as outlined. Follow pressures.  Currently on midodrine to keep blood pressure at a good level. Doing better.  Follow.

## 2023-11-10 NOTE — Assessment & Plan Note (Signed)
Continue crestor 

## 2023-11-10 NOTE — Assessment & Plan Note (Signed)
Watches diet.   Lab Results  Component Value Date   HGBA1C 4.9 07/08/2023

## 2023-11-10 NOTE — Assessment & Plan Note (Signed)
Now on eliquis after recent admission - CVA. Heart rate controlled. Denies chest pain or sob. No increased heart rate or palpitations. Make sure has f/u with cardiology.

## 2023-11-10 NOTE — Assessment & Plan Note (Signed)
Followed by hematology.  History of AVM.  Also with CKD.  Receiving IV iron.  Followed by Dr Donneta Romberg.  On eliquis now.  Will need closely monitor for any bleeding.  Follow hgb.  Check cbc today.

## 2023-11-10 NOTE — Telephone Encounter (Signed)
She was recently admitted with a stroke. Has a persistent right visual field deficit related to her recent stroke. Hospital had recommended f/u with ophthalmology. She sees Dr Melanie Crazier with [facility]. Please call and schedule a f/u appt.  Also, need to confirm if she has a neurology appt scheduled - f/u CVA and recent hospitalization.  If no, then needs a neurology referral.  I can place order for referral. See if has preference of which neurologist she prefers to see.

## 2023-11-10 NOTE — Assessment & Plan Note (Signed)
Recent admission as outlined.  W/up as outlined. On eliquis. Continue crestor. Still with residual right visual field deficit.  Plan for home health PT/OT and ST.

## 2023-11-11 NOTE — Telephone Encounter (Signed)
Error

## 2023-11-11 NOTE — Addendum Note (Signed)
Addended by: Rita Ohara D on: 11/11/2023 02:13 PM   Modules accepted: Orders

## 2023-11-11 NOTE — Telephone Encounter (Signed)
Spoke with daughter. She says that they called [facility] and was told she needs to see a neuro-ophthalmologist because of her visual field deficit and recent stroke. Information obtained from [name] for the [facility] that was recommended. Referral placed. There is already a referral to Dr Sherryll Burger in place. Provided daughter with number to [facility] neurology.

## 2023-11-11 NOTE — Telephone Encounter (Signed)
Pt was seen by Dr. Lorin Picket

## 2023-11-25 ENCOUNTER — Telehealth: Payer: Self-pay

## 2023-11-25 NOTE — Telephone Encounter (Signed)
 Regarding the vitamin D , please confirm that she started this 10/2023 - for low vitamin D .  If so, then would like to refill ergocalciferol  50,000 units q week (#12 with no refills).  After completing this rx, then start vitamin D3 1000 units q day. Regarding midodrine , need to know how her blood pressures are doing on the medication. Need to confirm if taking daily (once daily?).  It appears she saw cardiology recently.  Need to confirm if they are going to refill medication.

## 2023-11-25 NOTE — Telephone Encounter (Signed)
 Copied from CRM [id number]. Topic: Clinical - Medical Advice >> Nov 25, 2023 10:04 AM Adele Barthel wrote: Reason for CRM: [name], patient's daughter requesting CB# [phone]

## 2023-11-25 NOTE — Telephone Encounter (Signed)
 Patient saw cardiology. Refill was sent for eliquis by them but midodrine and Vit D was not. Jocelyn Elliott was just wanting to know if she should continue these 2 meds before she runs out. Does not need response today. Last Vit D level was 16.

## 2023-11-26 MED ORDER — VITAMIN D (ERGOCALCIFEROL) 1.25 MG (50000 UNIT) PO CAPS: 50000.0000 [IU] | ORAL_CAPSULE | ORAL | 0 refills | Status: DC

## 2023-11-26 NOTE — Telephone Encounter (Addendum)
 Jocelyn Elliott is calling cardiology to confirm about the midodrine. Her BP last night was 148/66. Rx for ergocalciferol 50,000 units q week. #12 with no refills.

## 2023-11-26 NOTE — Telephone Encounter (Signed)
 Daughter is aware and waiting on response from cardiology about midodrine.

## 2023-11-26 NOTE — Addendum Note (Signed)
 Addended by: Rita Ohara D on: 11/26/2023 01:30 PM   Modules accepted: Orders

## 2023-11-27 ENCOUNTER — Inpatient Hospital Stay: Payer: Medicare Other | Admitting: Internal Medicine

## 2023-11-27 ENCOUNTER — Inpatient Hospital Stay: Payer: Medicare Other

## 2023-11-27 ENCOUNTER — Encounter: Payer: Self-pay | Admitting: Internal Medicine

## 2023-11-27 ENCOUNTER — Inpatient Hospital Stay: Payer: Medicare Other | Attending: Internal Medicine

## 2023-11-27 VITALS — BP 153/76 | HR 76 | Temp 97.4°F | Resp 18

## 2023-11-27 VITALS — BP 124/62 | HR 81 | Temp 97.2°F | Ht 59.0 in | Wt 137.3 lb

## 2023-11-27 DIAGNOSIS — E538 Deficiency of other specified B group vitamins: Secondary | ICD-10-CM | POA: Diagnosis present

## 2023-11-27 DIAGNOSIS — N183 Chronic kidney disease, stage 3 unspecified: Secondary | ICD-10-CM

## 2023-11-27 DIAGNOSIS — Z7901 Long term (current) use of anticoagulants: Secondary | ICD-10-CM | POA: Diagnosis not present

## 2023-11-27 DIAGNOSIS — D631 Anemia in chronic kidney disease: Secondary | ICD-10-CM

## 2023-11-27 DIAGNOSIS — D508 Other iron deficiency anemias: Secondary | ICD-10-CM

## 2023-11-27 DIAGNOSIS — I4891 Unspecified atrial fibrillation: Secondary | ICD-10-CM | POA: Insufficient documentation

## 2023-11-27 DIAGNOSIS — D649 Anemia, unspecified: Secondary | ICD-10-CM

## 2023-11-27 LAB — BASIC METABOLIC PANEL
Anion gap: 8 (ref 5–15)
BUN: 27 mg/dL — ABNORMAL HIGH (ref 8–23)
CO2: 22 mmol/L (ref 22–32)
Calcium: 9.3 mg/dL (ref 8.9–10.3)
Chloride: 107 mmol/L (ref 98–111)
Creatinine, Ser: 1.33 mg/dL — ABNORMAL HIGH (ref 0.44–1.00)
GFR, Estimated: 38 mL/min — ABNORMAL LOW (ref >60)
Glucose, Bld: 140 mg/dL — ABNORMAL HIGH (ref 70–99)
Potassium: 3.7 mmol/L (ref 3.5–5.1)
Sodium: 137 mmol/L (ref 135–145)

## 2023-11-27 LAB — FERRITIN: Ferritin: 49 ng/mL (ref 11–307)

## 2023-11-27 LAB — CBC WITH DIFFERENTIAL (CANCER CENTER ONLY)
Abs Immature Granulocytes: 0.02 10*3/uL (ref 0.00–0.07)
Basophils Absolute: 0 10*3/uL (ref 0.0–0.1)
Basophils Relative: 1 %
Eosinophils Absolute: 0.2 10*3/uL (ref 0.0–0.5)
Eosinophils Relative: 4 %
HCT: 37.2 % (ref 36.0–46.0)
Hemoglobin: 11.9 g/dL — ABNORMAL LOW (ref 12.0–15.0)
Immature Granulocytes: 0 %
Lymphocytes Relative: 21 %
Lymphs Abs: 1.2 10*3/uL (ref 0.7–4.0)
MCH: 32.4 pg (ref 26.0–34.0)
MCHC: 32 g/dL (ref 30.0–36.0)
MCV: 101.4 fL — ABNORMAL HIGH (ref 80.0–100.0)
Monocytes Absolute: 0.4 10*3/uL (ref 0.1–1.0)
Monocytes Relative: 7 %
Neutro Abs: 3.8 10*3/uL (ref 1.7–7.7)
Neutrophils Relative %: 67 %
Platelet Count: 158 10*3/uL (ref 150–400)
RBC: 3.67 MIL/uL — ABNORMAL LOW (ref 3.87–5.11)
RDW: 14.4 % (ref 11.5–15.5)
WBC Count: 5.7 10*3/uL (ref 4.0–10.5)
nRBC: 0 % (ref 0.0–0.2)

## 2023-11-27 LAB — IRON AND TIBC
Iron: 25 ug/dL — ABNORMAL LOW (ref 28–170)
Saturation Ratios: 8 % — ABNORMAL LOW (ref 10.4–31.8)
TIBC: 332 ug/dL (ref 250–450)
UIBC: 307 ug/dL

## 2023-11-27 LAB — VITAMIN B12: Vitamin B-12: 413 pg/mL (ref 180–914)

## 2023-11-27 MED ORDER — CYANOCOBALAMIN 1000 MCG/ML IJ SOLN
1000.0000 ug | Freq: Once | INTRAMUSCULAR | Status: AC
  Administered 2023-11-27: 1000 ug via INTRAMUSCULAR
  Filled 2023-11-27: qty 1

## 2023-11-27 MED ORDER — FERUMOXYTOL INJECTION 510 MG/17 ML
510.0000 mg | Freq: Once | INTRAVENOUS | Status: AC
  Administered 2023-11-27: 510 mg via INTRAVENOUS
  Filled 2023-11-27: qty 510

## 2023-11-27 MED ORDER — SODIUM CHLORIDE 0.9 % IV SOLN
Freq: Once | INTRAVENOUS | Status: AC
  Filled 2023-11-27: qty 250

## 2023-11-27 NOTE — Assessment & Plan Note (Addendum)
#  Severe anemia hemoglobin-nadir 6 [summer 2020]. 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 JUNE 2023- NEGATIVE for hemolysis [haptoglobin-WNL]  # Today hemoglobin is  11.6-  HOLD Retacrit  today.- DEC 2024-  I sat-15  ferritin-26 - proceed with Ferrahem- monthly. Stable.   # B12 def- continue B12 injections every monthly. [march 2023] ; B12 levels - 723 [NOV 2023] Proceed with injection today. Stable.    # CKD stage III- Recommend continued p.o. intake.  Stable.   # A.fib/Stroke [Dec 2024] OFF plavix  [Dr.Shah/NP]-on eliquis .awaiting watchman device consideration- monitor closely for bleeding-  Stable.   Monthly B12; Ferra/retacrit  q m  # DISPOSITION:  # proceed with Ferrahem today;  # HOLD RETACRIT ;   # Ok with B 12 injection injection.  # in 1 month  MD-  labs- -cbc;bmp; - possible Ferrahem or possible retacrit ; B12 injection- Dr.B

## 2023-11-27 NOTE — Progress Notes (Signed)
 C/o left hip pain 6/10. No injury.  Had a stroke 10/29/23, [facility]. Affected right eye and memory. CT, MRI done. If BP below 100 systolic, takes midodrine .  Daughter states anxiety has gotten worse.  Fatigue/weakness: yes Dyspena: yes Light headedness: yes per pt/per daughter with anxiety Blood in stool: no

## 2023-11-27 NOTE — Progress Notes (Signed)
 [facility] OFFICE PROGRESS NOTE  Patient Care Team: Glendia Shad, MD as PCP - General (Internal Medicine) Glendia Shad, MD (Internal Medicine) Ammon Blunt, MD (Internal Medicine) Jackquline Sawyer, MD (Specialist) Schnier, Cordella MATSU, MD (Vascular Surgery) Ammon Blunt, MD (Internal Medicine) Jackquline Sawyer, MD (Specialist) Schnier, Cordella MATSU, MD (Vascular Surgery) Rennie Cindy SAUNDERS, MD as Consulting Physician (Oncology)   SUMMARY OF HEMATOLOGIC HISTORY:  # IRON DEFICIENCY ANEMIA- recurrent [? GI blood loss; Dr.Elliot; AVM-capsule]; IV Ferrahem q 32m; last colo- 2012/march; EGD- [id number].  July 2020 bone marrow biopsy-mild dysplastic changes; 40% hypercellularity; cytogenetics-normal.-Retacrit .MARCH 2023- Foundation One Hem- TP53 subclonal. B12 def- FEB 2023- Start B12 injections Monthly; MAY 3rd, 2023- Start Retacrit  20K [place].   #Colonoscopy-April 2020 aborted because of cardiac pause.  # CKD stage III-IV; CAD-Dr. Ammon;  March 30th, 2022-TIA/slurred spleech [Dr.Shah]; dec 2024- affecting right eye and memory.  INTERVAL HISTORY: In a wheelchair.  With her daughter.   89 year old female patient with above history of recurrent iron deficiency anemia unclear etiology/?-related to CKD/ A.fib- eliquis  2.5 mg BID-on Feraheme /Retacrit  is here for follow-up.  In the interim patient had a stroke affecting right eye and memory.  She had extensive workup in the hospital patient was discharged to rehab.  Increased anxiety since the stroke.  Also complains of left hip pain no recent trauma.  Patient also recommended if BP below 100 systolic, takes midodrine .   Daughter states anxiety has gotten worse.  Otherwise no blood in stools or black-colored stools.  Review of Systems  Constitutional:  Positive for malaise/fatigue. Negative for chills, diaphoresis, fever and weight loss.  HENT:  Negative for nosebleeds and sore throat.   Eyes:  Negative for  double vision.  Respiratory:  Negative for cough, hemoptysis, sputum production and wheezing.   Cardiovascular:  Negative for palpitations, orthopnea and leg swelling.  Gastrointestinal:  Negative for abdominal pain, blood in stool, constipation, diarrhea, heartburn, melena, nausea and vomiting.  Genitourinary:  Negative for dysuria, frequency and urgency.  Musculoskeletal:  Negative for back pain and joint pain.  Skin: Negative.  Negative for itching and rash.  Neurological:  Negative for dizziness, tingling, focal weakness, weakness and headaches.  Endo/Heme/Allergies:  Does not bruise/bleed easily.  Psychiatric/Behavioral:  Negative for depression. The patient is not nervous/anxious and does not have insomnia.      PAST MEDICAL HISTORY :  Past Medical History:  Diagnosis Date   Arthritis    AV malformation of gastrointestinal tract    Basal cell carcinoma 08/07/2023   Central upper forehead. Nodular pattern. Mohs 09/09/23   Blood in stool    CAD (coronary artery disease)    Carotid arterial disease (HCC)    Cataracts, bilateral    Chronic blood loss anemia    Chronic cystitis    CKD (chronic kidney disease), stage III (HCC)    Complication of anesthesia    reaction to propofol  - caused heart to pause   Depression    Diabetes mellitus (HCC)    GERD (gastroesophageal reflux disease)    Hyperlipidemia    Hypertension    IDA (iron deficiency anemia)    Stress incontinence    TIA (transient ischemic attack) 03/2021   No deficits   Wears dentures    full upper and lower    PAST SURGICAL HISTORY :   Past Surgical History:  Procedure Laterality Date   ABDOMINAL HYSTERECTOMY  11/19/1970   ovaries left in place   APPENDECTOMY  11/19/1990   BACK SURGERY  06/08/2010  L3, L4, L5   CARDIAC CATHETERIZATION  02/2001   CAROTID ARTERY ANGIOPLASTY  09/19/2002   CARPAL TUNNEL RELEASE  1991, 92   right then left    CATARACT EXTRACTION W/PHACO Left 02/13/2022   Procedure: CATARACT  EXTRACTION PHACO AND INTRAOCULAR LENS PLACEMENT (IOC) LEFT DIABETIC 14.90 01:26.8;  Surgeon: [name], MD;  Location: [facility];  Service: Ophthalmology;  Laterality: Left;  Diabetic   CATARACT EXTRACTION W/PHACO Right 03/06/2022   Procedure: CATARACT EXTRACTION PHACO AND INTRAOCULAR LENS PLACEMENT (IOC) RIGHT DIABETIC 9.66 00:53.7;  Surgeon: [name], MD;  Location: [facility];  Service: Ophthalmology;  Laterality: Right;  Diabetic   CHOLECYSTECTOMY  11/20/1991   COLONOSCOPY WITH PROPOFOL  N/A 02/02/2019   Procedure: COLONOSCOPY WITH PROPOFOL ;  Surgeon: [name], MD;  Location: [facility] END[facility]COPY;  Service: Endoscopy;  Laterality: N/A;   ESOPHAGOGASTRODUODENOSCOPY N/A 02/02/2019   Procedure: ESOPHAGOGASTRODUODENOSCOPY (EGD);  Surgeon: [name], MD;  Location: [facility] END[facility]COPY;  Service: Endoscopy;  Laterality: N/A;   FOOT SURGERY  06/20/2007   Left   right carotid artery surgery  01/09/2013   Dr. Jama @ [facility]   TOTAL HIP ARTHROPLASTY  05/03/2011   left 12, right 11/07    FAMILY HISTORY :   Family History  Problem Relation Age of Onset   Hyperlipidemia Father    Heart disease Father        myocardial infarction   Hypertension Father        Parent   Arthritis Other        parent   Diabetes Other        nephew   Cervical cancer Sister    Rectal cancer Sister    Breast cancer Neg Hx     SOCIAL HISTORY:   Social History   Tobacco Use   Smoking status: Former    Current packs/day: 0.00    Types: Cigarettes    Quit date: 11/19/1989    Years since quitting: 34.0   Smokeless tobacco: Never  Vaping Use   Vaping status: Never Used  Substance Use Topics   Alcohol use: No    Alcohol/week: 0.0 standard drinks of alcohol   Drug use: No    ALLERGIES:  is allergic to propofol , ciprofloxacin, levaquin [levofloxacin], and tequin [gatifloxacin].  MEDICATIONS:  Current Outpatient Medications  Medication Sig Dispense Refill   apixaban   (ELIQUIS ) 2.5 MG TABS tablet Take 1 tablet (2.5 mg total) by mouth 2 (two) times daily. 60 tablet 0   Biotin  5000 MCG CAPS Take 1 capsule by mouth daily.     cetirizine (ZYRTEC) 10 MG tablet Take 10 mg by mouth daily.     docusate sodium  (COLACE) 100 MG capsule Take 100 mg by mouth daily as needed for mild constipation.     ferrous sulfate  325 (65 FE) MG EC tablet Take 325 mg by mouth every other day. Every other day     finasteride  (PROSCAR ) 5 MG tablet Take 5 mg by mouth daily.     hydrocortisone  2.5 % cream apply topically 2 times daily as needed for itch at right lower leg 30 g 5   ketoconazole  (NIZORAL ) 2 % shampoo MASSAGE INTO SCALP AND LET SIT SEVERAL MINUTES BEFORE RINSING. USE 2-3 TIMES A WEEK. 120 mL 2   Lancet Device MISC Use as directed to check blood sugars 1 each 0   Lancets (ONETOUCH DELICA PLUS LANCET30G) MISC USE AS INSTRUCTED TO CHECK BLOOD SUGARS ONCE DAILY. DX E 11.9 100 each 12  midodrine  (PROAMATINE ) 5 MG tablet Take 1 tablet (5 mg total) by mouth 2 (two) times daily with a meal. 60 tablet 0   Omega-3 Fatty Acids (FISH OIL) 1200 MG CAPS Take 1 capsule by mouth daily.     Probiotic, Lactobacillus, CAPS Take 1 capsule by mouth daily.     rosuvastatin  (CRESTOR ) 40 MG tablet TAKE 1 TABLET BY MOUTH EVERY DAY 90 tablet 1   traZODone  (DESYREL ) 50 MG tablet TAKE 1/2 TO 1 TABLET BY MOUTH AT BEDTIME AS NEEDED FOR SLEEP (Patient taking differently: Take 50 mg by mouth at bedtime as needed for sleep. for sleep) 90 tablet 1   Vitamin D , Ergocalciferol , (DRISDOL ) 1.25 MG (50000 UNIT) CAPS capsule Take 1 capsule (50,000 Units total) by mouth every 7 (seven) days. 12 capsule 0   No current facility-administered medications for this visit.    PHYSICAL EXAMINATION:   BP 124/62 (BP Location: Left Arm, Patient Position: Sitting, Cuff Size: Normal)   Pulse 81   Temp (!) 97.2 F (36.2 C) (Tympanic)   Ht 4' 11 (1.499 m)   Wt 137 lb 4.8 oz (62.3 kg)   SpO2 97%   BMI 27.73 kg/m    Filed Weights   11/27/23 1448  Weight: 137 lb 4.8 oz (62.3 kg)     Physical Exam Constitutional:      Comments: Frail-appearing Caucasian female patient.  She is in a wheelchair.  HENT:     Head: Normocephalic and atraumatic.     Mouth/Throat:     Pharynx: No oropharyngeal exudate.  Eyes:     Pupils: Pupils are equal, round, and reactive to light.     Comments: Positive for pallor.  Cardiovascular:     Rate and Rhythm: Normal rate and regular rhythm.     Heart sounds: Murmur heard.  Pulmonary:     Effort: No respiratory distress.     Breath sounds: No wheezing.  Abdominal:     General: Bowel sounds are normal. There is no distension.     Palpations: Abdomen is soft. There is no mass.     Tenderness: There is no abdominal tenderness. There is no guarding or rebound.  Musculoskeletal:        General: No tenderness. Normal range of motion.     Cervical back: Normal range of motion and neck supple.  Skin:    General: Skin is warm.  Neurological:     Mental Status: She is alert and oriented to person, place, and time.  Psychiatric:        Mood and Affect: Affect normal.     LABORATORY DATA:  I have reviewed the data as listed    Component Value Date/Time   NA 137 11/27/2023 1436   NA 143 01/10/2013 0314   K 3.7 11/27/2023 1436   K 4.0 01/10/2013 0314   CL 107 11/27/2023 1436   CL 109 (H) 01/10/2013 0314   CO2 22 11/27/2023 1436   CO2 27 01/10/2013 0314   GLUCOSE 140 (H) 11/27/2023 1436   GLUCOSE 119 (H) 01/10/2013 0314   BUN 27 (H) 11/27/2023 1436   BUN 15 01/10/2013 0314   CREATININE 1.33 (H) 11/27/2023 1436   CREATININE 1.11 (H) 07/08/2023 1240   CREATININE 1.03 01/10/2013 0314   CREATININE 1.13 (H) 09/18/2012 0848   CALCIUM  9.3 11/27/2023 1436   CALCIUM  8.1 (L) 01/10/2013 0314   PROT 6.7 11/06/2023 1153   PROT 7.2 12/20/2011 1521   ALBUMIN 3.8 11/06/2023 1153   ALBUMIN 3.9  12/20/2011 1521   AST 45 (H) 11/06/2023 1153   AST 19 07/08/2023 1240   ALT  26 11/06/2023 1153   ALT 15 07/08/2023 1240   ALT 37 12/20/2011 1521   ALKPHOS 56 11/06/2023 1153   ALKPHOS 57 12/20/2011 1521   BILITOT 0.6 11/06/2023 1153   BILITOT 0.5 07/08/2023 1240   GFRNONAA 38 (L) 11/27/2023 1436   GFRNONAA 48 (L) 07/08/2023 1240   GFRNONAA 52 (L) 01/10/2013 0314   GFRNONAA 47 (L) 09/18/2012 0848   GFRAA 46 (L) 07/12/2020 1240   GFRAA >60 01/10/2013 0314   GFRAA 54 (L) 09/18/2012 0848    No results found for: SPEP, UPEP  Lab Results  Component Value Date   WBC 5.7 11/27/2023   NEUTROABS 3.8 11/27/2023   HGB 11.9 (L) 11/27/2023   HCT 37.2 11/27/2023   MCV 101.4 (H) 11/27/2023   PLT 158 11/27/2023      Chemistry      Component Value Date/Time   NA 137 11/27/2023 1436   NA 143 01/10/2013 0314   K 3.7 11/27/2023 1436   K 4.0 01/10/2013 0314   CL 107 11/27/2023 1436   CL 109 (H) 01/10/2013 0314   CO2 22 11/27/2023 1436   CO2 27 01/10/2013 0314   BUN 27 (H) 11/27/2023 1436   BUN 15 01/10/2013 0314   CREATININE 1.33 (H) 11/27/2023 1436   CREATININE 1.11 (H) 07/08/2023 1240   CREATININE 1.03 01/10/2013 0314   CREATININE 1.13 (H) 09/18/2012 0848      Component Value Date/Time   CALCIUM  9.3 11/27/2023 1436   CALCIUM  8.1 (L) 01/10/2013 0314   ALKPHOS 56 11/06/2023 1153   ALKPHOS 57 12/20/2011 1521   AST 45 (H) 11/06/2023 1153   AST 19 07/08/2023 1240   ALT 26 11/06/2023 1153   ALT 15 07/08/2023 1240   ALT 37 12/20/2011 1521   BILITOT 0.6 11/06/2023 1153   BILITOT 0.5 07/08/2023 1240      ASSESSMENT & PLAN:   Anemia of chronic kidney failure, stage 3 (moderate) (HCC) #Severe anemia hemoglobin-nadir 6 [summer 2020]. 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 JUNE 2023- NEGATIVE for hemolysis [haptoglobin-WNL]  # Today hemoglobin is  11.6-  HOLD Retacrit  today.- DEC 2024-  I sat-15  ferritin-26 - proceed with Ferrahem- monthly. Stable.   #  B12 def- continue B12 injections every monthly. [march 2023] ; B12 levels - 723 [NOV 2023] Proceed with injection today. Stable.    # CKD stage III- Recommend continued p.o. intake.  Stable.   # A.fib/Stroke [Dec 2024] OFF plavix  [Dr.Shah/NP]-on eliquis .awaiting watchman device consideration- monitor closely for bleeding-  Stable.   Monthly B12; Ferra/retacrit  q m  # DISPOSITION:  # proceed with Ferrahem today;  # HOLD RETACRIT ;   # Ok with B 12 injection injection.  # in 1 month  MD-  labs- -cbc;bmp; - possible Ferrahem or possible retacrit ; B12 injection- Dr.B      Cindy JONELLE Joe, MD 11/27/2023 3:23 PM

## 2023-12-09 ENCOUNTER — Telehealth: Payer: Self-pay

## 2023-12-09 DIAGNOSIS — M25559 Pain in unspecified hip: Secondary | ICD-10-CM

## 2023-12-09 NOTE — Telephone Encounter (Signed)
Copied from CRM [id number]. Topic: General - Other >> Dec 09, 2023  3:25 PM Fredrich Romans wrote: Reason for CRM: Patients daughter called in and is requesting a call back from Dr Marina Goodell nurse.She said that she would know what it was in regards to

## 2023-12-09 NOTE — Telephone Encounter (Signed)
Copied from CRM [id number]. Topic: Clinical - Home Health Verbal Orders >> Dec 09, 2023 12:38 PM Denese Killings wrote: Caller/Agency: [facility] Callback Number: [phone] Service Requested: Physical Therapy Frequency: N/A  stated that they need a new order sent over through [facility]  due to pt being admitted into hospital Any new concerns about the patient? No

## 2023-12-10 ENCOUNTER — Telehealth: Payer: Self-pay

## 2023-12-10 NOTE — Telephone Encounter (Signed)
SEE OTHER NOTE

## 2023-12-10 NOTE — Telephone Encounter (Signed)
Copied from CRM [id number]. Topic: General - Other >> Dec 10, 2023 11:51 AM Corin V wrote: Reason for CRM: Patient's daughter calling again asking to get a return call from Dr. Roby Lofts nurse as soon as possible.

## 2023-12-10 NOTE — Telephone Encounter (Signed)
Patients daughter stated that pt is still having hip pain especially at night. Tylenol helps during the day. Offered in office appt but they declined. Scheduled for virtual and advised that visit would be limited for examination of her hip.

## 2023-12-11 NOTE — Telephone Encounter (Signed)
Noted.  Let me know if I need to do anything. You had spoke to daughter yesterday regarding some pain.

## 2023-12-11 NOTE — Telephone Encounter (Signed)
Dr Lorin Picket, disregard. Meant to send to [place]

## 2023-12-11 NOTE — Telephone Encounter (Signed)
Pt scheduled for evaluation for her hip

## 2023-12-11 NOTE — Telephone Encounter (Signed)
Jocelyn Elliott, can you resend the 09/2023 referral since it is not closed?

## 2023-12-12 ENCOUNTER — Telehealth: Payer: Medicare Other | Admitting: Internal Medicine

## 2023-12-12 ENCOUNTER — Encounter: Payer: Self-pay | Admitting: Internal Medicine

## 2023-12-12 VITALS — Ht 59.0 in | Wt 137.3 lb

## 2023-12-12 DIAGNOSIS — N1832 Chronic kidney disease, stage 3b: Secondary | ICD-10-CM

## 2023-12-12 DIAGNOSIS — I480 Paroxysmal atrial fibrillation: Secondary | ICD-10-CM | POA: Diagnosis not present

## 2023-12-12 DIAGNOSIS — D509 Iron deficiency anemia, unspecified: Secondary | ICD-10-CM | POA: Diagnosis not present

## 2023-12-12 DIAGNOSIS — I10 Essential (primary) hypertension: Secondary | ICD-10-CM

## 2023-12-12 DIAGNOSIS — M25551 Pain in right hip: Secondary | ICD-10-CM

## 2023-12-12 DIAGNOSIS — I700 Atherosclerosis of aorta: Secondary | ICD-10-CM

## 2023-12-12 DIAGNOSIS — E1159 Type 2 diabetes mellitus with other circulatory complications: Secondary | ICD-10-CM

## 2023-12-12 DIAGNOSIS — Z8673 Personal history of transient ischemic attack (TIA), and cerebral infarction without residual deficits: Secondary | ICD-10-CM

## 2023-12-12 MED ORDER — METHYLPREDNISOLONE 4 MG PO TBPK: ORAL_TABLET | ORAL | 0 refills | Status: DC

## 2023-12-12 NOTE — Progress Notes (Signed)
Patient ID: Jocelyn Elliott, female   DOB: Nov 26, 1931, 89 y.o.   MRN: [id number]   Virtual Visit via video Note  I connected with Jocelyn Elliott by a video enabled telemedicine application and verified that I am speaking with the correct person using two identifiers. Location patient: home Location provider: work Persons participating in the virtual visit: patient, provider and pts daughter.   The limitations, risks, security and privacy concerns of performing an evaluation and management service by video and the availability of in person appointments have been discussed. It has also been discussed with the patient that there may be a patient responsible charge related to this service. The patient expressed understanding and agreed to proceed.   Reason for visit:  work in appt.   HPI: Work in with hip pain. Declined to come in to the office. Accompanied by her daughter Jocelyn Elliott. History obtained from both of them. Reports increased hip pain - worse at night. States she has had hip issues for 12 years. Takes tylenol arthritis. Sleeping in chair. Has finished her PT (that started after she was discharged from the hospital). Describes the pain - worse in right buttock, right hip and down right leg to knee. Unable to take antiinflammatory medication. On eliquis. Has taken prednisone previously and tolerated. Breathing stable. No increased cough or congestion. Blood sugars doing well. Blood pressure relatively stable - only takes midodrine if blood pressure is below 100.  May average 3x/week.    ROS: See pertinent positives and negatives per HPI.  Past Medical History:  Diagnosis Date   Arthritis    AV malformation of gastrointestinal tract    Basal cell carcinoma 08/07/2023   Central upper forehead. Nodular pattern. Mohs 09/09/23   Blood in stool    CAD (coronary artery disease)    Carotid arterial disease (HCC)    Cataracts, bilateral    Chronic blood loss anemia    Chronic cystitis    CKD  (chronic kidney disease), stage III (HCC)    Complication of anesthesia    reaction to propofol - caused heart to pause   Depression    Diabetes mellitus (HCC)    GERD (gastroesophageal reflux disease)    Hyperlipidemia    Hypertension    IDA (iron deficiency anemia)    Stress incontinence    TIA (transient ischemic attack) 03/2021   No deficits   Wears dentures    full upper and lower    Past Surgical History:  Procedure Laterality Date   ABDOMINAL HYSTERECTOMY  11/19/1970   ovaries left in place   APPENDECTOMY  11/19/1990   BACK SURGERY  06/08/2010   L3, L4, L5   CARDIAC CATHETERIZATION  02/2001   CAROTID ARTERY ANGIOPLASTY  09/19/2002   CARPAL TUNNEL RELEASE  1991, 92   right then left    CATARACT EXTRACTION W/PHACO Left 02/13/2022   Procedure: CATARACT EXTRACTION PHACO AND INTRAOCULAR LENS PLACEMENT (IOC) LEFT DIABETIC 14.90 01:26.8;  Surgeon: Galen Manila, MD;  Location: [facility];  Service: Ophthalmology;  Laterality: Left;  Diabetic   CATARACT EXTRACTION W/PHACO Right 03/06/2022   Procedure: CATARACT EXTRACTION PHACO AND INTRAOCULAR LENS PLACEMENT (IOC) RIGHT DIABETIC 9.66 00:53.7;  Surgeon: Galen Manila, MD;  Location: [facility];  Service: Ophthalmology;  Laterality: Right;  Diabetic   CHOLECYSTECTOMY  11/20/1991   COLONOSCOPY WITH PROPOFOL N/A 02/02/2019   Procedure: COLONOSCOPY WITH PROPOFOL;  Surgeon: Scot Jun, MD;  Location: [facility] ENDOSCOPY;  Service: Endoscopy;  Laterality: N/A;   ESOPHAGOGASTRODUODENOSCOPY  N/A 02/02/2019   Procedure: ESOPHAGOGASTRODUODENOSCOPY (EGD);  Surgeon: Scot Jun, MD;  Location: [facility] ENDOSCOPY;  Service: Endoscopy;  Laterality: N/A;   FOOT SURGERY  06/20/2007   Left   right carotid artery surgery  01/09/2013   Dr. Gilda Crease @ [facility]   TOTAL HIP ARTHROPLASTY  05/03/2011   left 12, right 11/07    Family History  Problem Relation Age of Onset   Hyperlipidemia Father    Heart disease Father         myocardial infarction   Hypertension Father        Parent   Arthritis Other        parent   Diabetes Other        nephew   Cervical cancer Sister    Rectal cancer Sister    Breast cancer Neg Hx     SOCIAL HX: reviewed.    Current Outpatient Medications:    apixaban (ELIQUIS) 2.5 MG TABS tablet, Take 1 tablet (2.5 mg total) by mouth 2 (two) times daily., Disp: 60 tablet, Rfl: 0   Biotin 5000 MCG CAPS, Take 1 capsule by mouth daily., Disp: , Rfl:    cetirizine (ZYRTEC) 10 MG tablet, Take 10 mg by mouth daily., Disp: , Rfl:    docusate sodium (COLACE) 100 MG capsule, Take 100 mg by mouth daily as needed for mild constipation., Disp: , Rfl:    ferrous sulfate 325 (65 FE) MG EC tablet, Take 325 mg by mouth every other day. Every other day, Disp: , Rfl:    finasteride (PROSCAR) 5 MG tablet, Take 5 mg by mouth daily., Disp: , Rfl:    hydrocortisone 2.5 % cream, apply topically 2 times daily as needed for itch at right lower leg, Disp: 30 g, Rfl: 5   ketoconazole (NIZORAL) 2 % shampoo, MASSAGE INTO SCALP AND LET SIT SEVERAL MINUTES BEFORE RINSING. USE 2-3 TIMES A WEEK., Disp: 120 mL, Rfl: 2   Lancet Device MISC, Use as directed to check blood sugars, Disp: 1 each, Rfl: 0   Lancets (ONETOUCH DELICA PLUS LANCET30G) MISC, USE AS INSTRUCTED TO CHECK BLOOD SUGARS ONCE DAILY. DX E 11.9, Disp: 100 each, Rfl: 12   methylPREDNISolone (MEDROL DOSEPAK) 4 MG TBPK tablet, Medro dose pak - 6 day taper. Take as directed., Disp: 21 tablet, Rfl: 0   midodrine (PROAMATINE) 5 MG tablet, Take 1 tablet (5 mg total) by mouth 2 (two) times daily with a meal., Disp: 60 tablet, Rfl: 0   Omega-3 Fatty Acids (FISH OIL) 1200 MG CAPS, Take 1 capsule by mouth daily., Disp: , Rfl:    Probiotic, Lactobacillus, CAPS, Take 1 capsule by mouth daily., Disp: , Rfl:    rosuvastatin (CRESTOR) 40 MG tablet, TAKE 1 TABLET BY MOUTH EVERY DAY, Disp: 90 tablet, Rfl: 1   traZODone (DESYREL) 50 MG tablet, TAKE 1/2 TO 1 TABLET BY MOUTH  AT BEDTIME AS NEEDED FOR SLEEP (Patient taking differently: Take 50 mg by mouth at bedtime as needed for sleep. for sleep), Disp: 90 tablet, Rfl: 1   Vitamin D, Ergocalciferol, (DRISDOL) 1.25 MG (50000 UNIT) CAPS capsule, Take 1 capsule (50,000 Units total) by mouth every 7 (seven) days., Disp: 12 capsule, Rfl: 0  EXAM:  VITALS per patient if applicable: 103/51, 62  GENERAL: alert, oriented, appears well and in no acute distress  HEENT: atraumatic, conjunttiva clear, no obvious abnormalities on inspection of external nose and ears  NECK: normal movements of the head and neck  LUNGS: on inspection no  signs of respiratory distress, breathing rate appears normal, no obvious gross SOB, gasping or wheezing  CV: no obvious cyanosis  PSYCH/NEURO: pleasant and cooperative, no obvious depression or anxiety, speech and thought processing grossly intact  ASSESSMENT AND PLAN:  Discussed the following assessment and plan:  Problem List Items Addressed This Visit     Stage 3b chronic kidney disease (HCC) - Primary   Continue to avoid antiinflammatories.  Stay hydrated.  Follow metabolic panel. Off ace inhibitor due to low blood pressure.       Paroxysmal atrial fibrillation (HCC)   Now on eliquis after recent admission - CVA.       Pain in hip   Reports right buttock pain/right hip pain and pain down right leg to knee. Unable to take antiinflammatory medication. Taking scheduled tylenol arthritis. Discussed stretches. Medrol dose pak as directed. Follow.       IDA (iron deficiency anemia)   Has been followed by hematology - intermittent iron infusions.       Hypertension   Blood pressure as outlined. Follow pressures.  Currently on midodrine to keep blood pressure at a good level. Doing better.  Follow.       History of CVA (cerebrovascular accident)   Recent admission as outlined.  W/up as outlined. On eliquis. Continue crestor. Still with residual right visual field deficit.  Plan  f/u with ophthalmology.       Diabetes mellitus (HCC)   Watches diet.   Lab Results  Component Value Date   HGBA1C 4.9 07/08/2023         Aortic atherosclerosis (HCC)   Continue crestor.          Return if symptoms worsen or fail to improve.   I discussed the assessment and treatment plan with the patient. The patient was provided an opportunity to ask questions and all were answered. The patient agreed with the plan and demonstrated an understanding of the instructions.   The patient was advised to call back or seek an in-person evaluation if the symptoms worsen or if the condition fails to improve as anticipated.    Dale Gooding, MD

## 2023-12-13 NOTE — Telephone Encounter (Signed)
[facility] placed on 10/12/2023

## 2023-12-15 ENCOUNTER — Encounter: Payer: Self-pay | Admitting: Internal Medicine

## 2023-12-15 NOTE — Assessment & Plan Note (Signed)
Continue to avoid antiinflammatories.  Stay hydrated.  Follow metabolic panel. Off ace inhibitor due to low blood pressure.

## 2023-12-15 NOTE — Assessment & Plan Note (Signed)
Reports right buttock pain/right hip pain and pain down right leg to knee. Unable to take antiinflammatory medication. Taking scheduled tylenol arthritis. Discussed stretches. Medrol dose pak as directed. Follow.

## 2023-12-15 NOTE — Assessment & Plan Note (Signed)
Continue crestor

## 2023-12-15 NOTE — Assessment & Plan Note (Signed)
Watches diet.   Lab Results  Component Value Date   HGBA1C 4.9 07/08/2023

## 2023-12-15 NOTE — Assessment & Plan Note (Signed)
Now on eliquis after recent admission - CVA.

## 2023-12-15 NOTE — Assessment & Plan Note (Signed)
Blood pressure as outlined. Follow pressures.  Currently on midodrine to keep blood pressure at a good level. Doing better.  Follow.

## 2023-12-15 NOTE — Assessment & Plan Note (Signed)
Recent admission as outlined.  W/up as outlined. On eliquis. Continue crestor. Still with residual right visual field deficit.  Plan f/u with ophthalmology.

## 2023-12-15 NOTE — Assessment & Plan Note (Addendum)
Has been followed by hematology - intermittent iron infusions.

## 2023-12-17 NOTE — Telephone Encounter (Signed)
No, referral needs to be resent because pt was hospitalized.

## 2023-12-18 NOTE — Telephone Encounter (Signed)
I placed order for home health with [facility]. I placed it for PT for hip pain. This is what was placed previously. Let me know if this is not correct or if I need to add anything more.

## 2023-12-18 NOTE — Addendum Note (Signed)
Addended by: Charm Barges on: 12/18/2023 06:43 PM   Modules accepted: Orders

## 2023-12-18 NOTE — Telephone Encounter (Signed)
I was asking can you send the referral from 09/2023 to them again since they need it resent after pt being hospitalized or do I need to start a new referral to send to them.

## 2023-12-19 NOTE — Telephone Encounter (Signed)
Noted

## 2023-12-19 NOTE — Telephone Encounter (Signed)
New referral ordered by Dr Lorin Picket.

## 2023-12-30 ENCOUNTER — Inpatient Hospital Stay: Payer: Medicare Other

## 2023-12-30 ENCOUNTER — Telehealth: Payer: Self-pay | Admitting: *Deleted

## 2023-12-30 ENCOUNTER — Inpatient Hospital Stay (HOSPITAL_BASED_OUTPATIENT_CLINIC_OR_DEPARTMENT_OTHER): Payer: Medicare Other | Admitting: Internal Medicine

## 2023-12-30 ENCOUNTER — Encounter: Payer: Self-pay | Admitting: Internal Medicine

## 2023-12-30 ENCOUNTER — Inpatient Hospital Stay: Payer: Medicare Other | Attending: Internal Medicine

## 2023-12-30 VITALS — BP 110/72 | HR 67 | Temp 98.1°F | Ht 59.0 in | Wt 137.0 lb

## 2023-12-30 VITALS — BP 154/71 | HR 66 | Temp 96.0°F | Resp 19

## 2023-12-30 DIAGNOSIS — N183 Chronic kidney disease, stage 3 unspecified: Secondary | ICD-10-CM

## 2023-12-30 DIAGNOSIS — Z8673 Personal history of transient ischemic attack (TIA), and cerebral infarction without residual deficits: Secondary | ICD-10-CM | POA: Diagnosis not present

## 2023-12-30 DIAGNOSIS — D508 Other iron deficiency anemias: Secondary | ICD-10-CM

## 2023-12-30 DIAGNOSIS — I129 Hypertensive chronic kidney disease with stage 1 through stage 4 chronic kidney disease, or unspecified chronic kidney disease: Secondary | ICD-10-CM | POA: Insufficient documentation

## 2023-12-30 DIAGNOSIS — Z7901 Long term (current) use of anticoagulants: Secondary | ICD-10-CM | POA: Diagnosis not present

## 2023-12-30 DIAGNOSIS — I4891 Unspecified atrial fibrillation: Secondary | ICD-10-CM | POA: Insufficient documentation

## 2023-12-30 DIAGNOSIS — E1122 Type 2 diabetes mellitus with diabetic chronic kidney disease: Secondary | ICD-10-CM | POA: Insufficient documentation

## 2023-12-30 DIAGNOSIS — D649 Anemia, unspecified: Secondary | ICD-10-CM

## 2023-12-30 DIAGNOSIS — E538 Deficiency of other specified B group vitamins: Secondary | ICD-10-CM | POA: Insufficient documentation

## 2023-12-30 DIAGNOSIS — R350 Frequency of micturition: Secondary | ICD-10-CM | POA: Insufficient documentation

## 2023-12-30 DIAGNOSIS — D631 Anemia in chronic kidney disease: Secondary | ICD-10-CM | POA: Diagnosis not present

## 2023-12-30 LAB — CBC WITH DIFFERENTIAL (CANCER CENTER ONLY)
Abs Immature Granulocytes: 0.03 10*3/uL (ref 0.00–0.07)
Basophils Absolute: 0.1 10*3/uL (ref 0.0–0.1)
Basophils Relative: 1 %
Eosinophils Absolute: 0.2 10*3/uL (ref 0.0–0.5)
Eosinophils Relative: 2 %
HCT: 37.4 % (ref 36.0–46.0)
Hemoglobin: 12 g/dL (ref 12.0–15.0)
Immature Granulocytes: 0 %
Lymphocytes Relative: 12 %
Lymphs Abs: 1 10*3/uL (ref 0.7–4.0)
MCH: 33.9 pg (ref 26.0–34.0)
MCHC: 32.1 g/dL (ref 30.0–36.0)
MCV: 105.6 fL — ABNORMAL HIGH (ref 80.0–100.0)
Monocytes Absolute: 0.5 10*3/uL (ref 0.1–1.0)
Monocytes Relative: 6 %
Neutro Abs: 6.2 10*3/uL (ref 1.7–7.7)
Neutrophils Relative %: 79 %
Platelet Count: 141 10*3/uL — ABNORMAL LOW (ref 150–400)
RBC: 3.54 MIL/uL — ABNORMAL LOW (ref 3.87–5.11)
RDW: 15.4 % (ref 11.5–15.5)
WBC Count: 7.9 10*3/uL (ref 4.0–10.5)
nRBC: 0 % (ref 0.0–0.2)

## 2023-12-30 LAB — BASIC METABOLIC PANEL
Anion gap: 9 (ref 5–15)
BUN: 22 mg/dL (ref 8–23)
CO2: 26 mmol/L (ref 22–32)
Calcium: 9.3 mg/dL (ref 8.9–10.3)
Chloride: 104 mmol/L (ref 98–111)
Creatinine, Ser: 1.4 mg/dL — ABNORMAL HIGH (ref 0.44–1.00)
GFR, Estimated: 36 mL/min — ABNORMAL LOW (ref >60)
Glucose, Bld: 127 mg/dL — ABNORMAL HIGH (ref 70–99)
Potassium: 4.1 mmol/L (ref 3.5–5.1)
Sodium: 139 mmol/L (ref 135–145)

## 2023-12-30 MED ORDER — SODIUM CHLORIDE 0.9 % IV SOLN
Freq: Once | INTRAVENOUS | Status: AC
Start: 2023-12-30 — End: 2023-12-30
  Filled 2023-12-30: qty 250

## 2023-12-30 MED ORDER — SODIUM CHLORIDE 0.9 % IV SOLN
510.0000 mg | Freq: Once | INTRAVENOUS | Status: AC
  Administered 2023-12-30: 510 mg via INTRAVENOUS
  Filled 2023-12-30: qty 510

## 2023-12-30 MED ORDER — CYANOCOBALAMIN 1000 MCG/ML IJ SOLN
1000.0000 ug | Freq: Once | INTRAMUSCULAR | Status: AC
Start: 2023-12-30 — End: 2023-12-30
  Administered 2023-12-30: 1000 ug via INTRAMUSCULAR

## 2023-12-30 NOTE — Telephone Encounter (Signed)
 Spoke to pt Daughter and  informed her that we would reach out to [facility] and we would contact them back to let them know after we speak to them

## 2023-12-30 NOTE — Telephone Encounter (Signed)
Copied from CRM [id number]. Topic: Referral - Question >> Dec 30, 2023  3:15 PM Jocelyn Elliott wrote: Reason for CRM: Patient called in to let Dr. Lorin Picket know they have not heard anything from [facility] // please call [phone]

## 2023-12-30 NOTE — Progress Notes (Signed)
Fatigue/weakness:YES Dyspena: YES Light headedness/dizziness:  YES dizziness Blood in stool:  NO  Has had nausea last 2 mornings.

## 2023-12-30 NOTE — Progress Notes (Signed)
[facility] OFFICE PROGRESS NOTE  Patient Care Team: Dale Emerald Lakes, MD as PCP - General (Internal Medicine) Dale Milford, MD (Internal Medicine) Marcina Millard, MD (Internal Medicine) Willeen Niece, MD (Specialist) Schnier, Latina Craver, MD (Vascular Surgery) Marcina Millard, MD (Internal Medicine) Willeen Niece, MD (Specialist) Schnier, Latina Craver, MD (Vascular Surgery) Earna Coder, MD as Consulting Physician (Oncology)   SUMMARY OF HEMATOLOGIC HISTORY:  # IRON DEFICIENCY ANEMIA- recurrent [? GI blood loss; Dr.Elliot; AVM-capsule]; IV Ferrahem q 11m; last colo- 2012/march; EGD- [id number].  July 2020 bone marrow biopsy-mild dysplastic changes; 40% hypercellularity; cytogenetics-normal.-Retacrit.MARCH 2023- [facility] One Hem- TP53 subclonal. B12 def- FEB 2023- Start B12 injections Monthly; MAY 3rd, 2023- Start Retacrit 20K [place].   #Colonoscopy-April 2020 aborted because of cardiac pause.  # CKD stage III-IV; CAD-Dr. Darrold Junker;  March 30th, 2022-TIA/slurred spleech [Dr.Shah]; dec 2024- affecting right eye and memory.  INTERVAL HISTORY: In a wheelchair.  With her daughter.   89 year old female patient with above history of recurrent iron deficiency anemia unclear etiology/?-related to CKD/ A.fib- eliquis 2.5 mg BID;stroke affecting right eye and memory -on Feraheme/Retacrit is here for follow-up.  Patient complains of increased frequency of urination. No fevers or chills.    Daughter states anxiety has gotten worse.  Otherwise no blood in stools or black-colored stools.  Review of Systems  Constitutional:  Positive for malaise/fatigue. Negative for chills, diaphoresis, fever and weight loss.  HENT:  Negative for nosebleeds and sore throat.   Eyes:  Negative for double vision.  Respiratory:  Negative for cough, hemoptysis, sputum production and wheezing.   Cardiovascular:  Negative for palpitations, orthopnea and leg swelling.  Gastrointestinal:   Negative for abdominal pain, blood in stool, constipation, diarrhea, heartburn, melena, nausea and vomiting.  Genitourinary:  Negative for dysuria, frequency and urgency.  Musculoskeletal:  Negative for back pain and joint pain.  Skin: Negative.  Negative for itching and rash.  Neurological:  Negative for dizziness, tingling, focal weakness, weakness and headaches.  Endo/Heme/Allergies:  Does not bruise/bleed easily.  Psychiatric/Behavioral:  Negative for depression. The patient is not nervous/anxious and does not have insomnia.      PAST MEDICAL HISTORY :  Past Medical History:  Diagnosis Date   Arthritis    AV malformation of gastrointestinal tract    Basal cell carcinoma 08/07/2023   Central upper forehead. Nodular pattern. Mohs 09/09/23   Blood in stool    CAD (coronary artery disease)    Carotid arterial disease (HCC)    Cataracts, bilateral    Chronic blood loss anemia    Chronic cystitis    CKD (chronic kidney disease), stage III (HCC)    Complication of anesthesia    reaction to propofol - caused heart to pause   Depression    Diabetes mellitus (HCC)    GERD (gastroesophageal reflux disease)    Hyperlipidemia    Hypertension    IDA (iron deficiency anemia)    Stress incontinence    TIA (transient ischemic attack) 03/2021   No deficits   Wears dentures    full upper and lower    PAST SURGICAL HISTORY :   Past Surgical History:  Procedure Laterality Date   ABDOMINAL HYSTERECTOMY  11/19/1970   ovaries left in place   APPENDECTOMY  11/19/1990   BACK SURGERY  06/08/2010   L3, L4, L5   CARDIAC CATHETERIZATION  02/2001   CAROTID ARTERY ANGIOPLASTY  09/19/2002   CARPAL TUNNEL RELEASE  1991, 92   right then left  CATARACT EXTRACTION W/PHACO Left 02/13/2022   Procedure: CATARACT EXTRACTION PHACO AND INTRAOCULAR LENS PLACEMENT (IOC) LEFT DIABETIC 14.90 01:26.8;  Surgeon: Galen Manila, MD;  Location: [facility];  Service: Ophthalmology;  Laterality:  Left;  Diabetic   CATARACT EXTRACTION W/PHACO Right 03/06/2022   Procedure: CATARACT EXTRACTION PHACO AND INTRAOCULAR LENS PLACEMENT (IOC) RIGHT DIABETIC 9.66 00:53.7;  Surgeon: Galen Manila, MD;  Location: [facility];  Service: Ophthalmology;  Laterality: Right;  Diabetic   CHOLECYSTECTOMY  11/20/1991   COLONOSCOPY WITH PROPOFOL N/A 02/02/2019   Procedure: COLONOSCOPY WITH PROPOFOL;  Surgeon: Scot Jun, MD;  Location: [facility] ENDOSCOPY;  Service: Endoscopy;  Laterality: N/A;   ESOPHAGOGASTRODUODENOSCOPY N/A 02/02/2019   Procedure: ESOPHAGOGASTRODUODENOSCOPY (EGD);  Surgeon: Scot Jun, MD;  Location: [facility] ENDOSCOPY;  Service: Endoscopy;  Laterality: N/A;   FOOT SURGERY  06/20/2007   Left   right carotid artery surgery  01/09/2013   Dr. Gilda Crease @ [facility]   TOTAL HIP ARTHROPLASTY  05/03/2011   left 12, right 11/07    FAMILY HISTORY :   Family History  Problem Relation Age of Onset   Hyperlipidemia Father    Heart disease Father        myocardial infarction   Hypertension Father        Parent   Arthritis Other        parent   Diabetes Other        nephew   Cervical cancer Sister    Rectal cancer Sister    Breast cancer Neg Hx     SOCIAL HISTORY:   Social History   Tobacco Use   Smoking status: Former    Current packs/day: 0.00    Types: Cigarettes    Quit date: 11/19/1989    Years since quitting: 34.1   Smokeless tobacco: Never  Vaping Use   Vaping status: Never Used  Substance Use Topics   Alcohol use: No    Alcohol/week: 0.0 standard drinks of alcohol   Drug use: No    ALLERGIES:  is allergic to propofol, ciprofloxacin, levaquin [levofloxacin], and tequin [gatifloxacin].  MEDICATIONS:  Current Outpatient Medications  Medication Sig Dispense Refill   apixaban (ELIQUIS) 2.5 MG TABS tablet Take 1 tablet (2.5 mg total) by mouth 2 (two) times daily. 60 tablet 0   Biotin 5000 MCG CAPS Take 1 capsule by mouth daily.     cetirizine (ZYRTEC) 10 MG  tablet Take 10 mg by mouth daily.     docusate sodium (COLACE) 100 MG capsule Take 100 mg by mouth daily as needed for mild constipation.     ferrous sulfate 325 (65 FE) MG EC tablet Take 325 mg by mouth every other day. Every other day     finasteride (PROSCAR) 5 MG tablet Take 5 mg by mouth daily.     hydrocortisone 2.5 % cream apply topically 2 times daily as needed for itch at right lower leg 30 g 5   ketoconazole (NIZORAL) 2 % shampoo MASSAGE INTO SCALP AND LET SIT SEVERAL MINUTES BEFORE RINSING. USE 2-3 TIMES A WEEK. 120 mL 2   Lancet Device MISC Use as directed to check blood sugars 1 each 0   Lancets (ONETOUCH DELICA PLUS LANCET30G) MISC USE AS INSTRUCTED TO CHECK BLOOD SUGARS ONCE DAILY. DX E 11.9 100 each 12   methylPREDNISolone (MEDROL DOSEPAK) 4 MG TBPK tablet Medro dose pak - 6 day taper. Take as directed. 21 tablet 0   midodrine (PROAMATINE) 5 MG tablet Take 1 tablet (5  mg total) by mouth 2 (two) times daily with a meal. 60 tablet 0   Omega-3 Fatty Acids (FISH OIL) 1200 MG CAPS Take 1 capsule by mouth daily.     Probiotic, Lactobacillus, CAPS Take 1 capsule by mouth daily.     rosuvastatin (CRESTOR) 40 MG tablet TAKE 1 TABLET BY MOUTH EVERY DAY 90 tablet 1   traZODone (DESYREL) 50 MG tablet TAKE 1/2 TO 1 TABLET BY MOUTH AT BEDTIME AS NEEDED FOR SLEEP (Patient taking differently: Take 50 mg by mouth at bedtime as needed for sleep. for sleep) 90 tablet 1   Vitamin D, Ergocalciferol, (DRISDOL) 1.25 MG (50000 UNIT) CAPS capsule Take 1 capsule (50,000 Units total) by mouth every 7 (seven) days. 12 capsule 0   No current facility-administered medications for this visit.    PHYSICAL EXAMINATION:   BP 110/72 (BP Location: Left Arm, Patient Position: Sitting, Cuff Size: Normal)   Pulse 67   Temp 98.1 F (36.7 C) (Tympanic)   Ht 4\' 11"  (1.499 m)   Wt 137 lb (62.1 kg)   SpO2 96%   BMI 27.67 kg/m   Filed Weights   12/30/23 1434  Weight: 137 lb (62.1 kg)      Physical  Exam Constitutional:      Comments: [place] Caucasian female patient.  She is in a wheelchair.  HENT:     Head: Normocephalic and atraumatic.     Mouth/Throat:     Pharynx: No oropharyngeal exudate.  Eyes:     Pupils: Pupils are equal, round, and reactive to light.     Comments: Positive for pallor.  Cardiovascular:     Rate and Rhythm: Normal rate and regular rhythm.     Heart sounds: Murmur heard.  Pulmonary:     Effort: No respiratory distress.     Breath sounds: No wheezing.  Abdominal:     General: Bowel sounds are normal. There is no distension.     Palpations: Abdomen is soft. There is no mass.     Tenderness: There is no abdominal tenderness. There is no guarding or rebound.  Musculoskeletal:        General: No tenderness. Normal range of motion.     Cervical back: Normal range of motion and neck supple.  Skin:    General: Skin is warm.  Neurological:     Mental Status: She is alert and oriented to person, place, and time.  Psychiatric:        Mood and Affect: Affect normal.     LABORATORY DATA:  I have reviewed the data as listed    Component Value Date/Time   NA 139 12/30/2023 1419   NA 143 01/10/2013 0314   K 4.1 12/30/2023 1419   K 4.0 01/10/2013 0314   CL 104 12/30/2023 1419   CL 109 (H) 01/10/2013 0314   CO2 26 12/30/2023 1419   CO2 27 01/10/2013 0314   GLUCOSE 127 (H) 12/30/2023 1419   GLUCOSE 119 (H) 01/10/2013 0314   BUN 22 12/30/2023 1419   BUN 15 01/10/2013 0314   CREATININE 1.40 (H) 12/30/2023 1419   CREATININE 1.11 (H) 07/08/2023 1240   CREATININE 1.03 01/10/2013 0314   CREATININE 1.13 (H) 09/18/2012 0848   CALCIUM 9.3 12/30/2023 1419   CALCIUM 8.1 (L) 01/10/2013 0314   PROT 6.7 11/06/2023 1153   PROT 7.2 12/20/2011 1521   ALBUMIN 3.8 11/06/2023 1153   ALBUMIN 3.9 12/20/2011 1521   AST 45 (H) 11/06/2023 1153   AST 19 07/08/2023  1240   ALT 26 11/06/2023 1153   ALT 15 07/08/2023 1240   ALT 37 12/20/2011 1521   ALKPHOS 56  11/06/2023 1153   ALKPHOS 57 12/20/2011 1521   BILITOT 0.6 11/06/2023 1153   BILITOT 0.5 07/08/2023 1240   GFRNONAA 36 (L) 12/30/2023 1419   GFRNONAA 48 (L) 07/08/2023 1240   GFRNONAA 52 (L) 01/10/2013 0314   GFRNONAA 47 (L) 09/18/2012 0848   GFRAA 46 (L) 07/12/2020 1240   GFRAA >60 01/10/2013 0314   GFRAA 54 (L) 09/18/2012 0848    No results found for: "SPEP", "UPEP"  Lab Results  Component Value Date   WBC 7.9 12/30/2023   NEUTROABS 6.2 12/30/2023   HGB 12.0 12/30/2023   HCT 37.4 12/30/2023   MCV 105.6 (H) 12/30/2023   PLT 141 (L) 12/30/2023      Chemistry      Component Value Date/Time   NA 139 12/30/2023 1419   NA 143 01/10/2013 0314   K 4.1 12/30/2023 1419   K 4.0 01/10/2013 0314   CL 104 12/30/2023 1419   CL 109 (H) 01/10/2013 0314   CO2 26 12/30/2023 1419   CO2 27 01/10/2013 0314   BUN 22 12/30/2023 1419   BUN 15 01/10/2013 0314   CREATININE 1.40 (H) 12/30/2023 1419   CREATININE 1.11 (H) 07/08/2023 1240   CREATININE 1.03 01/10/2013 0314   CREATININE 1.13 (H) 09/18/2012 0848      Component Value Date/Time   CALCIUM 9.3 12/30/2023 1419   CALCIUM 8.1 (L) 01/10/2013 0314   ALKPHOS 56 11/06/2023 1153   ALKPHOS 57 12/20/2011 1521   AST 45 (H) 11/06/2023 1153   AST 19 07/08/2023 1240   ALT 26 11/06/2023 1153   ALT 15 07/08/2023 1240   ALT 37 12/20/2011 1521   BILITOT 0.6 11/06/2023 1153   BILITOT 0.5 07/08/2023 1240      ASSESSMENT & PLAN:   Anemia of chronic kidney failure, stage 3 (moderate) (HCC) #Severe anemia hemoglobin-nadir 6 [summer 2020]. 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 JUNE 2023- NEGATIVE for hemolysis [haptoglobin-WNL]  # Today hemoglobin is  12  HOLD Retacrit today.- DEC 2024-  I sat-8 ferritin-49 - proceed with Ferrahem- monthly. Stable.   # B12 def- continue B12 injections every monthly. [march 2023] ; B12 levels - 723 [NOV 2023]  Proceed with injection today. Stable.    # CKD stage III- Recommend continued p.o. intake.  Stable.   # A.fib/Stroke [Dec 2024] OFF plavix [Dr.Shah/NP]-on eliquis.awaiting watchman device consideration- monitor closely for bleeding-  Stable.   # Increased frequency of urination- UA/culture- vs Overactive bladder. Patient declines UA for now.   Monthly B12; Ferra/retacrit q m  # DISPOSITION:  # proceed with Ferrahem today;  # HOLD RETACRIT;   # Ok with B 12 injection injection.  # in 1 month  MD-  labs- -cbc;bmp; - possible Ferrahem or possible retacrit; B12 injection- Dr.B       Earna Coder, MD 12/31/2023 1:15 PM

## 2023-12-30 NOTE — Assessment & Plan Note (Addendum)
#  Severe anemia hemoglobin-nadir 6 [summer 2020]. 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 JUNE 2023- NEGATIVE for hemolysis [haptoglobin-WNL]  # Today hemoglobin is  12  HOLD Retacrit  today.- DEC 2024-  I sat-8 ferritin-49 - proceed with Ferrahem- monthly. Stable.   # B12 def- continue B12 injections every monthly. [march 2023] ; B12 levels - 723 [NOV 2023] Proceed with injection today. Stable.    # CKD stage III- Recommend continued p.o. intake.  Stable.   # A.fib/Stroke [Dec 2024] OFF plavix  [Dr.Shah/NP]-on eliquis .awaiting watchman device consideration- monitor closely for bleeding-  Stable.   # Increased frequency of urination- UA/culture- vs Overactive bladder. Patient declines UA for now.   Monthly B12; Ferra/retacrit  q m  # DISPOSITION:  # proceed with Ferrahem today;  # HOLD RETACRIT ;   # Ok with B 12 injection injection.  # in 1 month  MD-  labs- -cbc;bmp; - possible Ferrahem or possible retacrit ; B12 injection- Dr.B

## 2023-12-31 ENCOUNTER — Encounter: Payer: Self-pay | Admitting: Internal Medicine

## 2024-01-03 DIAGNOSIS — I69341 Monoplegia of lower limb following cerebral infarction affecting right dominant side: Secondary | ICD-10-CM

## 2024-01-03 DIAGNOSIS — E1159 Type 2 diabetes mellitus with other circulatory complications: Secondary | ICD-10-CM

## 2024-01-03 DIAGNOSIS — D631 Anemia in chronic kidney disease: Secondary | ICD-10-CM

## 2024-01-03 DIAGNOSIS — I480 Paroxysmal atrial fibrillation: Secondary | ICD-10-CM

## 2024-01-03 DIAGNOSIS — E1122 Type 2 diabetes mellitus with diabetic chronic kidney disease: Secondary | ICD-10-CM

## 2024-01-03 DIAGNOSIS — I770 Arteriovenous fistula, acquired: Secondary | ICD-10-CM

## 2024-01-03 DIAGNOSIS — I69311 Memory deficit following cerebral infarction: Secondary | ICD-10-CM | POA: Diagnosis not present

## 2024-01-03 DIAGNOSIS — H53451 Other localized visual field defect, right eye: Secondary | ICD-10-CM | POA: Diagnosis not present

## 2024-01-03 DIAGNOSIS — I69398 Other sequelae of cerebral infarction: Secondary | ICD-10-CM | POA: Diagnosis not present

## 2024-01-03 DIAGNOSIS — I129 Hypertensive chronic kidney disease with stage 1 through stage 4 chronic kidney disease, or unspecified chronic kidney disease: Secondary | ICD-10-CM

## 2024-01-03 DIAGNOSIS — N1832 Chronic kidney disease, stage 3b: Secondary | ICD-10-CM

## 2024-01-03 DIAGNOSIS — I700 Atherosclerosis of aorta: Secondary | ICD-10-CM

## 2024-01-06 ENCOUNTER — Telehealth: Payer: Self-pay

## 2024-01-06 NOTE — Telephone Encounter (Signed)
Copied from CRM [id number]. Topic: Referral - Status >> Jan 06, 2024  9:24 AM Truddie Crumble wrote: Reason for CRM: patient daughter [name]) called stating they have not heard from [facility] and if they do not hear from them can the doctor refer patient to a ophthalmologist

## 2024-01-06 NOTE — Telephone Encounter (Signed)
See updated message 

## 2024-01-06 NOTE — Telephone Encounter (Signed)
Copied from CRM [id number]. Topic: Referral - Question >> Jan 06, 2024  9:39 AM Isabell A wrote: Reason for CRM: Daughter [name] states she called earlier in regard to referral for [facility] - she states they don't accept referrals from PCP office but she was able to get the patient scheduled for 3/3.

## 2024-01-06 NOTE — Telephone Encounter (Deleted)
Earliest Fill Date: 12/23/2023   Notes to Pharmacy: Refill after 03/23/23

## 2024-01-22 ENCOUNTER — Other Ambulatory Visit: Payer: Self-pay | Admitting: Internal Medicine

## 2024-01-23 ENCOUNTER — Telehealth: Payer: Self-pay

## 2024-01-23 ENCOUNTER — Other Ambulatory Visit: Payer: Self-pay | Admitting: Internal Medicine

## 2024-01-23 MED ORDER — ONETOUCH DELICA PLUS LANCET30G MISC: 12 refills | Status: AC

## 2024-01-23 NOTE — Telephone Encounter (Signed)
 Copied from CRM [id number]. Topic: Clinical - Prescription Issue >> Jan 23, 2024  4:31 PM Sim Boast F wrote: Reason for CRM: Patient needs new script for test strips sent to [id number] - [place], [place] - [address] [place] [address]

## 2024-01-23 NOTE — Telephone Encounter (Signed)
 Copied from CRM [id number]. Topic: Clinical - Medication Refill >> Jan 23, 2024  4:33 PM Sim Boast F wrote: Most Recent Primary Care Visit:  Provider: Dale Lawler  Department: [facility]  Visit Type: Earleen Reaper VIDEO VISIT  Date: 12/12/2023  Medication: Lancets (ONETOUCH DELICA PLUS LANCET30G) MISC   Has the patient contacted their pharmacy? No (Agent: If no, request that the patient contact the pharmacy for the refill. If patient does not wish to contact the pharmacy document the reason why and proceed with request.) (Agent: If yes, when and what did the pharmacy advise?)  Is this the correct pharmacy for this prescription? Yes If no, delete pharmacy and type the correct one.  This is the patient's preferred pharmacy:    [id number] - [place], [place] - [address] [place] [address] Phone: [phone] Fax: [phone]   Has the prescription been filled recently? Yes  Is the patient out of the medication? No  Has the patient been seen for an appointment in the last year OR does the patient have an upcoming appointment? Yes  Can we respond through MyChart? Yes  Agent: Please be advised that Rx refills may take up to 3 business days. We ask that you follow-up with your pharmacy.

## 2024-01-24 ENCOUNTER — Other Ambulatory Visit: Payer: Self-pay

## 2024-01-24 DIAGNOSIS — E1159 Type 2 diabetes mellitus with other circulatory complications: Secondary | ICD-10-CM

## 2024-01-24 MED ORDER — ONETOUCH ULTRA VI STRP: ORAL_STRIP | 12 refills | Status: DC

## 2024-01-24 NOTE — Telephone Encounter (Signed)
 Please confirm with pt or pts daughter that she is still taking trazodone and how she is taking.

## 2024-01-24 NOTE — Telephone Encounter (Signed)
Test strips sent in. 

## 2024-01-29 ENCOUNTER — Inpatient Hospital Stay: Payer: Medicare Other

## 2024-01-29 ENCOUNTER — Encounter: Payer: Self-pay | Admitting: Internal Medicine

## 2024-01-29 ENCOUNTER — Inpatient Hospital Stay (HOSPITAL_BASED_OUTPATIENT_CLINIC_OR_DEPARTMENT_OTHER): Payer: Medicare Other | Admitting: Internal Medicine

## 2024-01-29 ENCOUNTER — Inpatient Hospital Stay: Payer: Medicare Other | Attending: Internal Medicine

## 2024-01-29 VITALS — BP 152/92 | HR 79 | Temp 97.4°F | Resp 20 | Ht 59.0 in | Wt 135.8 lb

## 2024-01-29 VITALS — BP 174/64 | HR 85 | Resp 16

## 2024-01-29 DIAGNOSIS — D631 Anemia in chronic kidney disease: Secondary | ICD-10-CM | POA: Insufficient documentation

## 2024-01-29 DIAGNOSIS — Z7901 Long term (current) use of anticoagulants: Secondary | ICD-10-CM | POA: Insufficient documentation

## 2024-01-29 DIAGNOSIS — I4891 Unspecified atrial fibrillation: Secondary | ICD-10-CM | POA: Insufficient documentation

## 2024-01-29 DIAGNOSIS — E538 Deficiency of other specified B group vitamins: Secondary | ICD-10-CM | POA: Diagnosis present

## 2024-01-29 DIAGNOSIS — D649 Anemia, unspecified: Secondary | ICD-10-CM

## 2024-01-29 DIAGNOSIS — N183 Chronic kidney disease, stage 3 unspecified: Secondary | ICD-10-CM | POA: Diagnosis not present

## 2024-01-29 DIAGNOSIS — D508 Other iron deficiency anemias: Secondary | ICD-10-CM

## 2024-01-29 LAB — CBC WITH DIFFERENTIAL (CANCER CENTER ONLY)
Abs Immature Granulocytes: 0.03 10*3/uL (ref 0.00–0.07)
Basophils Absolute: 0.1 10*3/uL (ref 0.0–0.1)
Basophils Relative: 1 %
Eosinophils Absolute: 0.1 10*3/uL (ref 0.0–0.5)
Eosinophils Relative: 2 %
HCT: 38.9 % (ref 36.0–46.0)
Hemoglobin: 12.7 g/dL (ref 12.0–15.0)
Immature Granulocytes: 0 %
Lymphocytes Relative: 13 %
Lymphs Abs: 1 10*3/uL (ref 0.7–4.0)
MCH: 35 pg — ABNORMAL HIGH (ref 26.0–34.0)
MCHC: 32.6 g/dL (ref 30.0–36.0)
MCV: 107.2 fL — ABNORMAL HIGH (ref 80.0–100.0)
Monocytes Absolute: 0.4 10*3/uL (ref 0.1–1.0)
Monocytes Relative: 5 %
Neutro Abs: 6.1 10*3/uL (ref 1.7–7.7)
Neutrophils Relative %: 79 %
Platelet Count: 140 10*3/uL — ABNORMAL LOW (ref 150–400)
RBC: 3.63 MIL/uL — ABNORMAL LOW (ref 3.87–5.11)
RDW: 14.7 % (ref 11.5–15.5)
WBC Count: 7.7 10*3/uL (ref 4.0–10.5)
nRBC: 0 % (ref 0.0–0.2)

## 2024-01-29 LAB — BASIC METABOLIC PANEL
Anion gap: 8 (ref 5–15)
BUN: 23 mg/dL (ref 8–23)
CO2: 27 mmol/L (ref 22–32)
Calcium: 9.4 mg/dL (ref 8.9–10.3)
Chloride: 105 mmol/L (ref 98–111)
Creatinine, Ser: 1.16 mg/dL — ABNORMAL HIGH (ref 0.44–1.00)
GFR, Estimated: 45 mL/min — ABNORMAL LOW (ref >60)
Glucose, Bld: 155 mg/dL — ABNORMAL HIGH (ref 70–99)
Potassium: 3.8 mmol/L (ref 3.5–5.1)
Sodium: 140 mmol/L (ref 135–145)

## 2024-01-29 MED ORDER — SODIUM CHLORIDE 0.9 % IV SOLN
Freq: Once | INTRAVENOUS | Status: AC
  Filled 2024-01-29: qty 250

## 2024-01-29 MED ORDER — CYANOCOBALAMIN 1000 MCG/ML IJ SOLN
1000.0000 ug | Freq: Once | INTRAMUSCULAR | Status: AC
  Administered 2024-01-29: 1000 ug via INTRAMUSCULAR
  Filled 2024-01-29: qty 1

## 2024-01-29 MED ORDER — FERUMOXYTOL INJECTION 510 MG/17 ML
510.0000 mg | Freq: Once | INTRAVENOUS | Status: AC
  Administered 2024-01-29: 510 mg via INTRAVENOUS
  Filled 2024-01-29: qty 510

## 2024-01-29 NOTE — Progress Notes (Signed)
 [facility] OFFICE PROGRESS NOTE  Patient Care Team: Dale Tifton, MD as PCP - General (Internal Medicine) Dale Gerald, MD (Internal Medicine) Marcina Millard, MD (Internal Medicine) Willeen Niece, MD (Specialist) Schnier, Latina Craver, MD (Vascular Surgery) Marcina Millard, MD (Internal Medicine) Willeen Niece, MD (Specialist) Schnier, Latina Craver, MD (Vascular Surgery) Earna Coder, MD as Consulting Physician (Oncology)   SUMMARY OF HEMATOLOGIC HISTORY:  # IRON DEFICIENCY ANEMIA- recurrent [? GI blood loss; Dr.Elliot; AVM-capsule]; IV Ferrahem q 43m; last colo- 2012/march; EGD- [id number].  July 2020 bone marrow biopsy-mild dysplastic changes; 40% hypercellularity; cytogenetics-normal.-Retacrit.MARCH 2023- Foundation One Hem- TP53 subclonal. B12 def- FEB 2023- Start B12 injections Monthly; MAY 3rd, 2023- Start Retacrit 20K [place].   #Colonoscopy-April 2020 aborted because of cardiac pause.  # CKD stage III-IV; CAD-Dr. Darrold Junker;  March 30th, 2022-TIA/slurred spleech [Dr.Shah]; dec 2024- affecting right eye and memory.  INTERVAL HISTORY: In a wheelchair.  With her daughter.   89 year old female patient with above history of recurrent iron deficiency anemia unclear etiology/?-related to CKD/ A.fib- eliquis 2.5 mg BID;stroke affecting right eye and memory -on Feraheme/Retacrit is here for follow-up.  April 9th at [facility] Dr. Maisie Fus putting in a watchman. Had in depth eye exam at [facility]-Vision loss on the right side of both eyes due to a stroke in the left side of the brai    Otherwise no blood in stools or black-colored stools.  Review of Systems  Constitutional:  Positive for malaise/fatigue. Negative for chills, diaphoresis, fever and weight loss.  HENT:  Negative for nosebleeds and sore throat.   Eyes:  Negative for double vision.  Respiratory:  Negative for cough, hemoptysis, sputum production and wheezing.   Cardiovascular:  Negative for  palpitations, orthopnea and leg swelling.  Gastrointestinal:  Negative for abdominal pain, blood in stool, constipation, diarrhea, heartburn, melena, nausea and vomiting.  Genitourinary:  Negative for dysuria, frequency and urgency.  Musculoskeletal:  Negative for back pain and joint pain.  Skin: Negative.  Negative for itching and rash.  Neurological:  Negative for dizziness, tingling, focal weakness, weakness and headaches.  Endo/Heme/Allergies:  Does not bruise/bleed easily.  Psychiatric/Behavioral:  Negative for depression. The patient is not nervous/anxious and does not have insomnia.      PAST MEDICAL HISTORY :  Past Medical History:  Diagnosis Date   Arthritis    AV malformation of gastrointestinal tract    Basal cell carcinoma 08/07/2023   Central upper forehead. Nodular pattern. Mohs 09/09/23   Blood in stool    CAD (coronary artery disease)    Carotid arterial disease (HCC)    Cataracts, bilateral    Chronic blood loss anemia    Chronic cystitis    CKD (chronic kidney disease), stage III (HCC)    Complication of anesthesia    reaction to propofol - caused heart to pause   Depression    Diabetes mellitus (HCC)    GERD (gastroesophageal reflux disease)    Hyperlipidemia    Hypertension    IDA (iron deficiency anemia)    Stress incontinence    TIA (transient ischemic attack) 03/2021   No deficits   Wears dentures    full upper and lower    PAST SURGICAL HISTORY :   Past Surgical History:  Procedure Laterality Date   ABDOMINAL HYSTERECTOMY  11/19/1970   ovaries left in place   APPENDECTOMY  11/19/1990   BACK SURGERY  06/08/2010   L3, L4, L5   CARDIAC CATHETERIZATION  02/2001   CAROTID ARTERY ANGIOPLASTY  09/19/2002   CARPAL TUNNEL RELEASE  1991, 92   right then left    CATARACT EXTRACTION W/PHACO Left 02/13/2022   Procedure: CATARACT EXTRACTION PHACO AND INTRAOCULAR LENS PLACEMENT (IOC) LEFT DIABETIC 14.90 01:26.8;  Surgeon: Galen Manila, MD;  Location:  [facility];  Service: Ophthalmology;  Laterality: Left;  Diabetic   CATARACT EXTRACTION W/PHACO Right 03/06/2022   Procedure: CATARACT EXTRACTION PHACO AND INTRAOCULAR LENS PLACEMENT (IOC) RIGHT DIABETIC 9.66 00:53.7;  Surgeon: Galen Manila, MD;  Location: [facility];  Service: Ophthalmology;  Laterality: Right;  Diabetic   CHOLECYSTECTOMY  11/20/1991   COLONOSCOPY WITH PROPOFOL N/A 02/02/2019   Procedure: COLONOSCOPY WITH PROPOFOL;  Surgeon: Scot Jun, MD;  Location: [facility] END[facility];  Service: Endoscopy;  Laterality: N/A;   ESOPHAGOGASTRODUODENOSCOPY N/A 02/02/2019   Procedure: ESOPHAGOGASTRODUODENOSCOPY (EGD);  Surgeon: Scot Jun, MD;  Location: [facility] END[facility]COPY;  Service: Endoscopy;  Laterality: N/A;   FOOT SURGERY  06/20/2007   Left   right carotid artery surgery  01/09/2013   Dr. Gilda Crease @ [facility]   TOTAL HIP ARTHROPLASTY  05/03/2011   left 12, right 11/07    FAMILY HISTORY :   Family History  Problem Relation Age of Onset   Hyperlipidemia Father    Heart disease Father        myocardial infarction   Hypertension Father        Parent   Arthritis Other        parent   Diabetes Other        nephew   Cervical cancer Sister    Rectal cancer Sister    Breast cancer Neg Hx     SOCIAL HISTORY:   Social History   Tobacco Use   Smoking status: Former    Current packs/day: 0.00    Types: Cigarettes    Quit date: 11/19/1989    Years since quitting: 34.2   Smokeless tobacco: Never  Vaping Use   Vaping status: Never Used  Substance Use Topics   Alcohol use: No    Alcohol/week: 0.0 standard drinks of alcohol   Drug use: No    ALLERGIES:  is allergic to propofol, ciprofloxacin, levaquin [levofloxacin], and tequin [gatifloxacin].  MEDICATIONS:  Current Outpatient Medications  Medication Sig Dispense Refill   apixaban (ELIQUIS) 2.5 MG TABS tablet Take 1 tablet (2.5 mg total) by mouth 2 (two) times daily. 60 tablet 0   Biotin 5000 MCG CAPS  Take 1 capsule by mouth daily.     cetirizine (ZYRTEC) 10 MG tablet Take 10 mg by mouth daily.     docusate sodium (COLACE) 100 MG capsule Take 100 mg by mouth daily as needed for mild constipation.     ferrous sulfate 325 (65 FE) MG EC tablet Take 325 mg by mouth every other day. Every other day     finasteride (PROSCAR) 5 MG tablet Take 5 mg by mouth daily.     glucose blood (ONETOUCH ULTRA) test strip USE AS INSTRUCTED TO CHECK BLOOD SUGARS ONCE DAILY. DX E11.9 100 strip 12   hydrocortisone 2.5 % cream apply topically 2 times daily as needed for itch at right lower leg 30 g 5   ketoconazole (NIZORAL) 2 % shampoo MASSAGE INTO SCALP AND LET SIT SEVERAL MINUTES BEFORE RINSING. USE 2-3 TIMES A WEEK. 120 mL 2   Lancet Device MISC Use as directed to check blood sugars 1 each 0   Lancets (ONETOUCH DELICA PLUS LANCET30G) MISC USE AS INSTRUCTED TO CHECK BLOOD SUGARS ONCE DAILY.  DX E 11.9 100 each 12   midodrine (PROAMATINE) 5 MG tablet Take 1 tablet (5 mg total) by mouth 2 (two) times daily with a meal. 60 tablet 0   Omega-3 Fatty Acids (FISH OIL) 1200 MG CAPS Take 1 capsule by mouth daily.     Probiotic, Lactobacillus, CAPS Take 1 capsule by mouth daily.     rosuvastatin (CRESTOR) 40 MG tablet TAKE 1 TABLET BY MOUTH EVERY DAY 90 tablet 1   traZODone (DESYREL) 50 MG tablet TAKE 1/2 TO 1 TABLET BY MOUTH AT BEDTIME AS NEEDED FOR SLEEP (Patient taking differently: Take 50 mg by mouth at bedtime as needed for sleep. for sleep) 90 tablet 1   Vitamin D, Ergocalciferol, (DRISDOL) 1.25 MG (50000 UNIT) CAPS capsule Take 1 capsule (50,000 Units total) by mouth every 7 (seven) days. 12 capsule 0   No current facility-administered medications for this visit.   Facility-Administered Medications Ordered in Other Visits  Medication Dose Route Frequency Provider Last Rate Last Admin   0.9 %  sodium chloride infusion   Intravenous Once Earna Coder, MD       cyanocobalamin (VITAMIN B12) injection 1,000 mcg   1,000 mcg Intramuscular Once Earna Coder, MD       ferumoxytol [facility]) 510 mg in sodium chloride 0.9 % 100 mL IVPB  510 mg Intravenous Once Earna Coder, MD        PHYSICAL EXAMINATION:   BP (!) 152/92 (BP Location: Left Arm, Patient Position: Sitting, Cuff Size: Normal)   Pulse 79   Temp (!) 97.4 F (36.3 C) (Tympanic)   Resp 20   Ht 4\' 11"  (1.499 m)   Wt 135 lb 12.8 oz (61.6 kg)   SpO2 94%   BMI 27.43 kg/m   Filed Weights   01/29/24 1454  Weight: 135 lb 12.8 oz (61.6 kg)       Physical Exam Constitutional:      Comments: [place] Caucasian female patient.  She is in a wheelchair.  HENT:     Head: Normocephalic and atraumatic.     Mouth/Throat:     Pharynx: No oropharyngeal exudate.  Eyes:     Pupils: Pupils are equal, round, and reactive to light.     Comments: Positive for pallor.  Cardiovascular:     Rate and Rhythm: Normal rate and regular rhythm.     Heart sounds: Murmur heard.  Pulmonary:     Effort: No respiratory distress.     Breath sounds: No wheezing.  Abdominal:     General: Bowel sounds are normal. There is no distension.     Palpations: Abdomen is soft. There is no mass.     Tenderness: There is no abdominal tenderness. There is no guarding or rebound.  Musculoskeletal:        General: No tenderness. Normal range of motion.     Cervical back: Normal range of motion and neck supple.  Skin:    General: Skin is warm.  Neurological:     Mental Status: She is alert and oriented to person, place, and time.  Psychiatric:        Mood and Affect: Affect normal.     LABORATORY DATA:  I have reviewed the data as listed    Component Value Date/Time   NA 140 01/29/2024 1436   NA 143 01/10/2013 0314   K 3.8 01/29/2024 1436   K 4.0 01/10/2013 0314   CL 105 01/29/2024 1436   CL 109 (H) 01/10/2013 1610  CO2 27 01/29/2024 1436   CO2 27 01/10/2013 0314   GLUCOSE 155 (H) 01/29/2024 1436   GLUCOSE 119 (H) 01/10/2013 0314    BUN 23 01/29/2024 1436   BUN 15 01/10/2013 0314   CREATININE 1.16 (H) 01/29/2024 1436   CREATININE 1.11 (H) 07/08/2023 1240   CREATININE 1.03 01/10/2013 0314   CREATININE 1.13 (H) 09/18/2012 0848   CALCIUM 9.4 01/29/2024 1436   CALCIUM 8.1 (L) 01/10/2013 0314   PROT 6.7 11/06/2023 1153   PROT 7.2 12/20/2011 1521   ALBUMIN 3.8 11/06/2023 1153   ALBUMIN 3.9 12/20/2011 1521   AST 45 (H) 11/06/2023 1153   AST 19 07/08/2023 1240   ALT 26 11/06/2023 1153   ALT 15 07/08/2023 1240   ALT 37 12/20/2011 1521   ALKPHOS 56 11/06/2023 1153   ALKPHOS 57 12/20/2011 1521   BILITOT 0.6 11/06/2023 1153   BILITOT 0.5 07/08/2023 1240   GFRNONAA 45 (L) 01/29/2024 1436   GFRNONAA 48 (L) 07/08/2023 1240   GFRNONAA 52 (L) 01/10/2013 0314   GFRNONAA 47 (L) 09/18/2012 0848   GFRAA 46 (L) 07/12/2020 1240   GFRAA >60 01/10/2013 0314   GFRAA 54 (L) 09/18/2012 0848    No results found for: "SPEP", "UPEP"  Lab Results  Component Value Date   WBC 7.7 01/29/2024   NEUTROABS 6.1 01/29/2024   HGB 12.7 01/29/2024   HCT 38.9 01/29/2024   MCV 107.2 (H) 01/29/2024   PLT 140 (L) 01/29/2024      Chemistry      Component Value Date/Time   NA 140 01/29/2024 1436   NA 143 01/10/2013 0314   K 3.8 01/29/2024 1436   K 4.0 01/10/2013 0314   CL 105 01/29/2024 1436   CL 109 (H) 01/10/2013 0314   CO2 27 01/29/2024 1436   CO2 27 01/10/2013 0314   BUN 23 01/29/2024 1436   BUN 15 01/10/2013 0314   CREATININE 1.16 (H) 01/29/2024 1436   CREATININE 1.11 (H) 07/08/2023 1240   CREATININE 1.03 01/10/2013 0314   CREATININE 1.13 (H) 09/18/2012 0848      Component Value Date/Time   CALCIUM 9.4 01/29/2024 1436   CALCIUM 8.1 (L) 01/10/2013 0314   ALKPHOS 56 11/06/2023 1153   ALKPHOS 57 12/20/2011 1521   AST 45 (H) 11/06/2023 1153   AST 19 07/08/2023 1240   ALT 26 11/06/2023 1153   ALT 15 07/08/2023 1240   ALT 37 12/20/2011 1521   BILITOT 0.6 11/06/2023 1153   BILITOT 0.5 07/08/2023 1240      ASSESSMENT &  PLAN:   Anemia of chronic kidney failure, stage 3 (moderate) (HCC) #Severe anemia hemoglobin-nadir 6 [summer 2020]. 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 JUNE 2023- NEGATIVE for hemolysis [haptoglobin-WNL]  # Today hemoglobin is  12  HOLD Retacrit today.- DEC 2024-  I sat-8 ferritin-49 - proceed with Ferrahem- monthly. Stable.   # B12 def- continue B12 injections every monthly. [march 2023] ; B12 levels - 723 [NOV 2023] Proceed with injection today. Stable.    # CKD stage III- Recommend continued p.o. intake.  Stable.   # A.fib/Stroke [Dec 2024] OFF plavix [Dr.Shah/NP]-on eliquis; .April 9th at [facility] Dr. Maisie Fus putting in a watchman   Monthly B12; Ferra/retacrit q m  # DISPOSITION:  # proceed with [name] today;  # HOLD RETACRIT;   # Ok with B 12 injection injection.  # in 2  month  MD-  labs- -cbc;bmp;iron studies;ferriitn- -  possible Ferrahem or possible retacrit; B12 injection- Dr.B       Earna Coder, MD 01/29/2024 3:39 PM

## 2024-01-29 NOTE — Assessment & Plan Note (Addendum)
#  Severe anemia hemoglobin-nadir 6 [summer 2020]. 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 JUNE 2023- NEGATIVE for hemolysis [haptoglobin-WNL]  # Today hemoglobin is  12  HOLD Retacrit today.- DEC 2024-  I sat-8 ferritin-49 - proceed with Ferrahem- monthly. Stable.   # B12 def- continue B12 injections every monthly. [march 2023] ; B12 levels - 723 [NOV 2023] Proceed with injection today. Stable.    # CKD stage III- Recommend continued p.o. intake.  Stable.   # A.fib/Stroke [Dec 2024] OFF plavix [Dr.Shah/NP]-on eliquis; .April 9th at [facility] Dr. Maisie Fus putting in a watchman   Monthly B12; Ferra/retacrit q m  # DISPOSITION:  # proceed with [name] today;  # HOLD RETACRIT;   # Ok with B 12 injection injection.  # in 2  month  MD-  labs- -cbc;bmp;iron studies;ferriitn- - possible Ferrahem or possible retacrit; B12 injection- Dr.B

## 2024-01-29 NOTE — Progress Notes (Signed)
 Fatigue/weakness:  yes Dyspena: no Light headedness: sometimes Blood in stool: no  Pt is now on metoprolol, unsure of dosage.  April 9th at [facility] Dr. Maisie Fus putting in a watchman. Had in depth eye exam at [facility]-Vision loss on the right side of both eyes due to a stroke in the left side of the brain.

## 2024-02-04 ENCOUNTER — Ambulatory Visit: Payer: Medicare Other | Admitting: Internal Medicine

## 2024-02-04 ENCOUNTER — Encounter: Payer: Self-pay | Admitting: Internal Medicine

## 2024-02-04 VITALS — BP 128/76 | HR 78 | Temp 98.0°F | Resp 16 | Ht 59.0 in | Wt 134.6 lb

## 2024-02-04 DIAGNOSIS — I10 Essential (primary) hypertension: Secondary | ICD-10-CM

## 2024-02-04 DIAGNOSIS — Z8673 Personal history of transient ischemic attack (TIA), and cerebral infarction without residual deficits: Secondary | ICD-10-CM

## 2024-02-04 DIAGNOSIS — I700 Atherosclerosis of aorta: Secondary | ICD-10-CM

## 2024-02-04 DIAGNOSIS — D509 Iron deficiency anemia, unspecified: Secondary | ICD-10-CM

## 2024-02-04 DIAGNOSIS — I480 Paroxysmal atrial fibrillation: Secondary | ICD-10-CM

## 2024-02-04 DIAGNOSIS — E7800 Pure hypercholesterolemia, unspecified: Secondary | ICD-10-CM | POA: Diagnosis not present

## 2024-02-04 DIAGNOSIS — N1832 Chronic kidney disease, stage 3b: Secondary | ICD-10-CM

## 2024-02-04 DIAGNOSIS — D696 Thrombocytopenia, unspecified: Secondary | ICD-10-CM

## 2024-02-04 DIAGNOSIS — E1159 Type 2 diabetes mellitus with other circulatory complications: Secondary | ICD-10-CM | POA: Diagnosis not present

## 2024-02-04 DIAGNOSIS — H5340 Unspecified visual field defects: Secondary | ICD-10-CM

## 2024-02-04 DIAGNOSIS — I2510 Atherosclerotic heart disease of native coronary artery without angina pectoris: Secondary | ICD-10-CM

## 2024-02-04 DIAGNOSIS — R600 Localized edema: Secondary | ICD-10-CM

## 2024-02-04 DIAGNOSIS — I779 Disorder of arteries and arterioles, unspecified: Secondary | ICD-10-CM

## 2024-02-04 DIAGNOSIS — G479 Sleep disorder, unspecified: Secondary | ICD-10-CM

## 2024-02-04 NOTE — Progress Notes (Signed)
 Subjective:    Patient ID: Jocelyn Elliott, female    DOB: May 25, 1932, 89 y.o.   MRN: [id number]  Patient here for  Chief Complaint  Patient presents with   Medical Management of Chronic Issues    HPI Here for a scheduled follow up - follow up regarding CKD, afib, IDA, hypertension and diabetes. She is accompanied by her daughter [name]. History obtained from both of them. Had f/u with Dr Donneta Romberg 01/29/24 - hgb 12. Hold retacrit. Ferrahem monthly. Continue B12 injections. Saw ophthalmology 01/20/24 - vision loss on the right side of both eyes due to a stroke in the left side of the brain. Has known afib. On eliquis now. Planning for watchman - 02/26/24. Planning for f/u in the low vision clinic 02/17/24. Breathing stable.    Past Medical History:  Diagnosis Date   Arthritis    AV malformation of gastrointestinal tract    Basal cell carcinoma 08/07/2023   Central upper forehead. Nodular pattern. Mohs 09/09/23   Blood in stool    CAD (coronary artery disease)    Carotid arterial disease (HCC)    Cataracts, bilateral    Chronic blood loss anemia    Chronic cystitis    CKD (chronic kidney disease), stage III (HCC)    Complication of anesthesia    reaction to propofol - caused heart to pause   Depression    Diabetes mellitus (HCC)    GERD (gastroesophageal reflux disease)    Hyperlipidemia    Hypertension    IDA (iron deficiency anemia)    Stress incontinence    TIA (transient ischemic attack) 03/2021   No deficits   Wears dentures    full upper and lower   Past Surgical History:  Procedure Laterality Date   ABDOMINAL HYSTERECTOMY  11/19/1970   ovaries left in place   APPENDECTOMY  11/19/1990   BACK SURGERY  06/08/2010   L3, L4, L5   CARDIAC CATHETERIZATION  02/2001   CAROTID ARTERY ANGIOPLASTY  09/19/2002   CARPAL TUNNEL RELEASE  1991, 92   right then left    CATARACT EXTRACTION W/PHACO Left 02/13/2022   Procedure: CATARACT EXTRACTION PHACO AND INTRAOCULAR LENS  PLACEMENT (IOC) LEFT DIABETIC 14.90 01:26.8;  Surgeon: Galen Manila, MD;  Location: [facility];  Service: Ophthalmology;  Laterality: Left;  Diabetic   CATARACT EXTRACTION W/PHACO Right 03/06/2022   Procedure: CATARACT EXTRACTION PHACO AND INTRAOCULAR LENS PLACEMENT (IOC) RIGHT DIABETIC 9.66 00:53.7;  Surgeon: Galen Manila, MD;  Location: [facility];  Service: Ophthalmology;  Laterality: Right;  Diabetic   CHOLECYSTECTOMY  11/20/1991   COLONOSCOPY WITH PROPOFOL N/A 02/02/2019   Procedure: COLONOSCOPY WITH PROPOFOL;  Surgeon: Scot Jun, MD;  Location: [facility] END[facility]COPY;  Service: Endoscopy;  Laterality: N/A;   ESOPHAGOGASTRODUODENOSCOPY N/A 02/02/2019   Procedure: ESOPHAGOGASTRODUODENOSCOPY (EGD);  Surgeon: Scot Jun, MD;  Location: [facility] END[facility]COPY;  Service: Endoscopy;  Laterality: N/A;   FOOT SURGERY  06/20/2007   Left   right carotid artery surgery  01/09/2013   Dr. Gilda Crease @ [facility]   TOTAL HIP ARTHROPLASTY  05/03/2011   left 12, right 11/07   Family History  Problem Relation Age of Onset   Hyperlipidemia Father    Heart disease Father        myocardial infarction   Hypertension Father        Parent   Arthritis Other        parent   Diabetes Other        nephew  Cervical cancer Sister    Rectal cancer Sister    Breast cancer Neg Hx    Social History   Socioeconomic History   Marital status: Widowed    Spouse name: Not on file   Number of children: 1   Years of education: 57   Highest education level: Not on file  Occupational History   Occupation: Retired  Tobacco Use   Smoking status: Former    Current packs/day: 0.00    Types: Cigarettes    Quit date: 11/19/1989    Years since quitting: 34.2   Smokeless tobacco: Never  Vaping Use   Vaping status: Never Used  Substance and Sexual Activity   Alcohol use: No    Alcohol/week: 0.0 standard drinks of alcohol   Drug use: No   Sexual activity: Not Currently  Other Topics Concern    Not on file  Social History Narrative   Regular exercise-no   Caffeine Use-yes   Social Drivers of Health   Financial Resource Strain: Low Risk  (04/02/2023)   Overall Financial Resource Strain (CARDIA)    Difficulty of Paying Living Expenses: Not hard at all  Food Insecurity: No Food Insecurity (11/04/2023)   Hunger Vital Sign    Worried About Running Out of Food in the Last Year: Never true    Ran Out of Food in the Last Year: Never true  Transportation Needs: No Transportation Needs (11/04/2023)   PRAPARE - Administrator, Civil Service (Medical): No    Lack of Transportation (Non-Medical): No  Physical Activity: Insufficiently Active (04/02/2023)   Exercise Vital Sign    Days of Exercise per Week: 7 days    Minutes of Exercise per Session: 20 min  Stress: No Stress Concern Present (04/02/2023)   [employer] of Occupational Health - Occupational Stress Questionnaire    Feeling of Stress : Not at all  Social Connections: Socially Integrated (04/02/2023)   Social Connection and Isolation Panel [NHANES]    Frequency of Communication with Friends and Family: More than three times a week    Frequency of Social Gatherings with Friends and Family: More than three times a week    Attends Religious Services: More than 4 times per year    Active Member of [employer] or Organizations: Yes    Attends Engineer, structural: More than 4 times per year    Marital Status: Married     Review of Systems  Constitutional:  Negative for appetite change and unexpected weight change.  HENT:  Negative for congestion and sinus pressure.   Respiratory:  Negative for cough and chest tightness.        Breathing stable.   Cardiovascular:  Negative for chest pain and palpitations.       Some lower extremity swelling.   Gastrointestinal:  Negative for abdominal pain, diarrhea, nausea and vomiting.  Genitourinary:  Negative for difficulty urinating and dysuria.  Musculoskeletal:   Negative for joint swelling and myalgias.  Skin:  Negative for color change and rash.  Neurological:  Negative for dizziness and headaches.  Psychiatric/Behavioral:  Negative for agitation and dysphoric mood.        Objective:     BP 128/76   Pulse 78   Temp 98 F (36.7 C)   Resp 16   Ht 4\' 11"  (1.499 m)   Wt 134 lb 9.6 oz (61.1 kg)   SpO2 98%   BMI 27.19 kg/m  Wt Readings from Last 3 Encounters:  02/04/24 134  lb 9.6 oz (61.1 kg)  01/29/24 135 lb 12.8 oz (61.6 kg)  12/30/23 137 lb (62.1 kg)    Physical Exam Vitals reviewed.  Constitutional:      General: She is not in acute distress.    Appearance: Normal appearance.  HENT:     Head: Normocephalic and atraumatic.     Right Ear: External ear normal.     Left Ear: External ear normal.     Mouth/Throat:     Pharynx: No oropharyngeal exudate or posterior oropharyngeal erythema.  Eyes:     General: No scleral icterus.       Right eye: No discharge.        Left eye: No discharge.     Conjunctiva/sclera: Conjunctivae normal.  Neck:     Thyroid: No thyromegaly.  Cardiovascular:     Rate and Rhythm: Normal rate and regular rhythm.  Pulmonary:     Effort: No respiratory distress.     Breath sounds: Normal breath sounds. No wheezing.  Abdominal:     General: Bowel sounds are normal.     Palpations: Abdomen is soft.     Tenderness: There is no abdominal tenderness.  Musculoskeletal:        General: No swelling or tenderness.     Cervical back: Neck supple. No tenderness.  Lymphadenopathy:     Cervical: No cervical adenopathy.  Skin:    Findings: No erythema or rash.  Neurological:     Mental Status: She is alert.  Psychiatric:        Mood and Affect: Mood normal.        Behavior: Behavior normal.         Outpatient Encounter Medications as of 02/04/2024  Medication Sig   metoprolol succinate (TOPROL-XL) 25 MG 24 hr tablet Take 25 mg by mouth daily.   apixaban (ELIQUIS) 2.5 MG TABS tablet Take 1 tablet (2.5  mg total) by mouth 2 (two) times daily.   Biotin 5000 MCG CAPS Take 1 capsule by mouth daily.   cetirizine (ZYRTEC) 10 MG tablet Take 10 mg by mouth daily.   docusate sodium (COLACE) 100 MG capsule Take 100 mg by mouth daily as needed for mild constipation.   ferrous sulfate 325 (65 FE) MG EC tablet Take 325 mg by mouth every other day. Every other day   finasteride (PROSCAR) 5 MG tablet Take 5 mg by mouth daily.   glucose blood (ONETOUCH ULTRA) test strip USE AS INSTRUCTED TO CHECK BLOOD SUGARS ONCE DAILY. DX E11.9   hydrocortisone 2.5 % cream apply topically 2 times daily as needed for itch at right lower leg   ketoconazole (NIZORAL) 2 % shampoo MASSAGE INTO SCALP AND LET SIT SEVERAL MINUTES BEFORE RINSING. USE 2-3 TIMES A WEEK.   Lancet Device MISC Use as directed to check blood sugars   Lancets (ONETOUCH DELICA PLUS LANCET30G) MISC USE AS INSTRUCTED TO CHECK BLOOD SUGARS ONCE DAILY. DX E 11.9   midodrine (PROAMATINE) 5 MG tablet Take 1 tablet (5 mg total) by mouth 2 (two) times daily with a meal.   Omega-3 Fatty Acids (FISH OIL) 1200 MG CAPS Take 1 capsule by mouth daily.   Probiotic, Lactobacillus, CAPS Take 1 capsule by mouth daily.   rosuvastatin (CRESTOR) 40 MG tablet TAKE 1 TABLET BY MOUTH EVERY DAY   Vitamin D, Ergocalciferol, (DRISDOL) 1.25 MG (50000 UNIT) CAPS capsule Take 1 capsule (50,000 Units total) by mouth every 7 (seven) days.   [DISCONTINUED] traZODone (DESYREL) 50 MG tablet  TAKE 1/2 TO 1 TABLET BY MOUTH AT BEDTIME AS NEEDED FOR SLEEP (Patient taking differently: Take 50 mg by mouth at bedtime as needed for sleep. for sleep)   No facility-administered encounter medications on file as of 02/04/2024.     Lab Results  Component Value Date   WBC 7.7 01/29/2024   HGB 12.7 01/29/2024   HCT 38.9 01/29/2024   PLT 140 (L) 01/29/2024   GLUCOSE 95 02/04/2024   CHOL 137 02/04/2024   TRIG 123.0 02/04/2024   HDL 61.90 02/04/2024   LDLDIRECT 51.0 04/14/2019   LDLCALC 50 02/04/2024    ALT 30 02/04/2024   AST 38 (H) 02/04/2024   NA 144 02/04/2024   K 4.1 02/04/2024   CL 105 02/04/2024   CREATININE 1.21 (H) 02/04/2024   BUN 18 02/04/2024   CO2 29 02/04/2024   TSH 2.134 10/29/2023   INR 1.2 02/28/2023   HGBA1C 5.6 02/04/2024   MICROALBUR 12.7 (H) 07/16/2022    ECHOCARDIOGRAM COMPLETE Result Date: 10/31/2023    ECHOCARDIOGRAM REPORT   Patient Name:   LORIEL DIEHL Date of Exam: 10/30/2023 Medical Rec #:  [id number]        Height:       59.0 in Accession #:    [id number]       Weight:       138.0 lb Date of Birth:  20-Jun-1932        BSA:          1.575 m Patient Age:    89 years         BP:           136/68 mmHg Patient Gender: F                HR:           89 bpm. Exam Location:  [facility] Procedure: 2D Echo, Cardiac Doppler and Color Doppler Indications:     I63.9 Stroke  History:         Patient has no prior history of Echocardiogram examinations.                  CAD, TIA; Risk Factors:Hypertension, Diabetes and Dyslipidemia.  Sonographer:     Daphine Deutscher RDCS Referring Phys:  [id number] ASHISH ARORA Diagnosing Phys: Alwyn Pea MD IMPRESSIONS  1. Left ventricular ejection fraction, by estimation, is 55 to 60%. The left ventricle has normal function. The left ventricle has no regional wall motion abnormalities. Left ventricular diastolic parameters are consistent with Grade III diastolic dysfunction (restrictive).  2. Right ventricular systolic function is normal. The right ventricular size is normal.  3. Left atrial size was moderately dilated.  4. Right atrial size was mild to moderately dilated.  5. The mitral valve is abnormal. Moderate mitral valve regurgitation. Moderate to severe mitral annular calcification.  6. The aortic valve is calcified. Aortic valve regurgitation is mild. Mild aortic valve stenosis. FINDINGS  Left Ventricle: Left ventricular ejection fraction, by estimation, is 55 to 60%. The left ventricle has normal function. The left ventricle has no  regional wall motion abnormalities. The left ventricular internal cavity size was normal in size. There is  no left ventricular hypertrophy. Left ventricular diastolic parameters are consistent with Grade III diastolic dysfunction (restrictive). Right Ventricle: The right ventricular size is normal. No increase in right ventricular wall thickness. Right ventricular systolic function is normal. Left Atrium: Left atrial size was moderately dilated. Right Atrium: Right atrial size was mild to moderately  dilated. Pericardium: There is no evidence of pericardial effusion. The mitral valve is abnormal. There is moderate thickening of the mitral valve leaflet(s). There is severe calcification of the mitral valve leaflet(s). Mildly decreased mobility of the mitral valve leaflets. Moderate to severe mitral annular calcification. Moderate mitral valve regurgitation. Tricuspid Valve: The tricuspid valve is grossly normal. Tricuspid valve regurgitation is mild. The aortic valve is calcified. Aortic valve regurgitation is mild. Mild aortic stenosis is present. Pulmonic Valve: The pulmonic valve was normal in structure. Pulmonic valve regurgitation is not visualized. Aorta: The ascending aorta was not well visualized. IAS/Shunts: No atrial level shunt detected by color flow Doppler.  LEFT VENTRICLE PLAX 2D LVIDd:         4.70 cm LVIDs:         3.30 cm LV PW:         0.90 cm LV IVS:        0.70 cm LVOT diam:     1.60 cm LV SV:         57 LV SV Index:   36 LVOT Area:     2.01 cm  RIGHT VENTRICLE            IVC RV Basal diam:  3.20 cm    IVC diam: 1.30 cm RV S prime:     9.99 cm/s TAPSE (M-mode): 1.6 cm LEFT ATRIUM             Index        RIGHT ATRIUM          Index LA diam:        5.00 cm 3.17 cm/m   RA Area:     9.03 cm LA Vol (A2C):   41.2 ml 26.15 ml/m  RA Volume:   19.70 ml 12.51 ml/m LA Vol (A4C):   32.6 ml 20.69 ml/m LA Biplane Vol: 38.6 ml 24.50 ml/m  AORTIC VALVE AV Area (Vmax):    1.44 cm AV Area (Vmean):   1.41  cm AV Area (VTI):     1.43 cm AV Vmax:           215.60 cm/s AV Vmean:          151.000 cm/s AV VTI:            0.399 m AV Peak Grad:      18.6 mmHg AV Mean Grad:      10.6 mmHg LVOT Vmax:         154.67 cm/s LVOT Vmean:        105.667 cm/s LVOT VTI:          0.284 m LVOT/AV VTI ratio: 0.71  AORTA Ao Root diam: 3.10 cm Ao Asc diam:  3.50 cm MITRAL VALVE                TRICUSPID VALVE MV Area (PHT): 4.74 cm     TR Peak grad:   46.2 mmHg MV Area VTI:   1.51 cm     TR Vmax:        340.00 cm/s MV Peak grad:  16.5 mmHg MV Mean grad:  7.0 mmHg     SHUNTS MV Vmax:       2.03 m/s     Systemic VTI:  0.28 m MV Vmean:      120.5 cm/s   Systemic Diam: 1.60 cm MV Decel Time: 160 msec MV E velocity: 168.00 cm/s MV A velocity: 63.40 cm/s MV E/A ratio:  2.65 Dwayne D Callwood  MD Electronically signed by Alwyn Pea MD Signature Date/Time: 10/31/2023/4:09:35 PM    Final    CT ANGIO HEAD NECK W WO CM Result Date: 10/30/2023 CLINICAL DATA:  Neuro deficit, acute, stroke suspected EXAM: CT ANGIOGRAPHY HEAD AND NECK WITH AND WITHOUT CONTRAST TECHNIQUE: Multidetector CT imaging of the head and neck was performed using the standard protocol during bolus administration of intravenous contrast. Multiplanar CT image reconstructions and MIPs were obtained to evaluate the vascular anatomy. Carotid stenosis measurements (when applicable) are obtained utilizing NASCET criteria, using the distal internal carotid diameter as the denominator. RADIATION DOSE REDUCTION: This exam was performed according to the departmental dose-optimization program which includes automated exposure control, adjustment of the mA and/or kV according to patient size and/or use of iterative reconstruction technique. CONTRAST:  75mL OMNIPAQUE IOHEXOL 350 MG/ML SOLN COMPARISON:  None Available. FINDINGS: CT HEAD FINDINGS Brain: No hemorrhage. No hydrocephalus. No extra-axial fluid collection. There is a chronic left superior cerebellar infarct. No CT evidence  of an acute cortical infarct. Mass effect. No mass lesion. There is a background of moderate chronic microvascular ischemic change. Vascular: No hyperdense vessel or unexpected calcification. Skull: Normal. Negative for fracture or focal lesion. Sinuses/Orbits: No middle ear or mastoid effusion. Paranasal sinuses are clear. Bilateral lens replacement. Orbits are otherwise unremarkable. Other: None. Review of the MIP images confirms the above findings CTA NECK FINDINGS Aortic arch: Standard branching. Imaged portion shows no evidence of aneurysm or dissection. Atherosclerotic calcifications of the aortic arch. Moderate narrowing of the origin of the left subclavian artery likely the brachiocephalic artery. Right carotid system: No evidence of dissection, stenosis (50% or greater), or occlusion. Mild narrowing in the cavernous segment of the right ICA Left carotid system: No evidence of dissection or occlusion. Approximately 60% stenosis of the origin of the left ICA secondary to soft atherosclerotic plaque. Somewhat beaded and irregular appearance of the distal left common carotid artery, as can be seen in the setting of FMD. Vertebral arteries: Right dominant. Moderate to severe stenosis of the origin of the left vertebral artery secondary to calcified atherosclerotic plaque no evidence of dissection, stenosis (50% or greater), or occlusion. Skeleton: Grade 1 anterolisthesis of C3 on C4 and C4 on C5 Other neck: Negative. Upper chest: Negative. Review of the MIP images confirms the above findings CTA HEAD FINDINGS Anterior circulation: No significant stenosis, proximal occlusion, aneurysm, or vascular malformation. Posterior circulation: No significant stenosis, proximal occlusion, aneurysm, or vascular malformation. Moderate narrowing in the P2 segment of the left PCA Venous sinuses: As permitted by contrast timing, patent. Anatomic variants: No Review of the MIP images confirms the above findings IMPRESSION: 1. No  hemorrhage or CT evidence of an acute cortical infarct. 2. No intracranial large vessel occlusion. 3. Approximately 60% stenosis of the origin of the left ICA secondary to soft atherosclerotic plaque. 4. Moderate to severe stenosis of the origin of the left vertebral artery secondary to calcified atherosclerotic plaque. 5. Somewhat beaded and irregular appearance of the distal left common carotid artery and proximal left ICA, as can be seen in the setting of FMD. Aortic Atherosclerosis (ICD10-I70.0). Electronically Signed   By: Lorenza Cambridge M.D.   On: 10/30/2023 11:18   MR BRAIN WO CONTRAST Result Date: 10/30/2023 CLINICAL DATA:  Onset of right-sided weakness and confusion with cysts speech difficulty today. EXAM: MRI HEAD WITHOUT CONTRAST TECHNIQUE: Multiplanar, multiecho pulse sequences of the brain and surrounding structures were obtained without intravenous contrast. COMPARISON:  Head CT from yesterday. FINDINGS: Brain:  Band of restricted diffusion at the upper and lateral left thalamus extending laterally to the periatrial white matter and medial temporal cortex. There is a background of chronic small vessel ischemic gliosis in the deep cerebral white matter. Chronic left cerebellar infarction. No evidence of hemorrhage, hydrocephalus, mass, or collection. Vascular: Grossly preserved major flow voids by axial T2 weighted imaging. Skull and upper cervical spine: No gross marrow lesion Sinuses/Orbits: Negative Other: Progressive severe motion artifact with numerous nondiagnostic sequences. The scan was truncated before MRA and coronal T2 weighted imaging. IMPRESSION: Very motion degraded and truncated brain MRI but still positive for acute infarct in the left thalamus capsular, periatrial, and medial temporal areas (thalamoperforator versus anterior choroidal infarction). Electronically Signed   By: Tiburcio Pea M.D.   On: 10/30/2023 04:39       Assessment & Plan:  Paroxysmal atrial fibrillation  [facility]) Assessment & Plan: Continue eliquis.    Hypercholesteremia Assessment & Plan: Continue crestor.  Low cholesterol diet and exercise.  Follow lipid panel and liver function tests.    Orders: -     Hepatic function panel -     Lipid panel  Primary hypertension Assessment & Plan: Blood pressure as outlined. Follow pressures.  Currently on midodrine to keep blood pressure at a good level.   Orders: -     Basic metabolic panel  Type 2 diabetes mellitus with other circulatory complication, without long-term current use of insulin (HCC) Assessment & Plan: Follow met b and A1c.  Lab Results  Component Value Date   HGBA1C 5.6 02/04/2024     Orders: -     Hemoglobin A1c  Visual field defect Assessment & Plan: Saw ophthalmology 01/20/24 - vision loss on the right side of both eyes due to a stroke in the left side of the brain. Planning for f/u in the low vision clinic 02/17/24.    Thrombocytopenia (HCC) Assessment & Plan: Follow cbc.  Being followed by hematology.    Stage 3b chronic kidney disease (HCC) Assessment & Plan: Continue to avoid antiinflammatories.  Stay hydrated.  Follow metabolic panel. Off ace inhibitor due to low blood pressure.    Sleeping difficulty Assessment & Plan: Continues on trazodone.    Peripheral edema Assessment & Plan: Leg elevation. Avoid increased sodium intake.    Iron deficiency anemia, unspecified iron deficiency anemia type Assessment & Plan: Had f/u with Dr Donneta Romberg 01/29/24 - hgb 12. Hold retacrit. Ferrahem monthly. Continue B12 injections.    History of CVA (cerebrovascular accident) Assessment & Plan: Recent admission as outlined.  W/up as outlined. On eliquis. Continue crestor. Still with residual right visual field deficit.  Evaluated by ophthalmology.    Carotid artery disease, unspecified laterality, unspecified type (HCC) Assessment & Plan:  Continue antiplatelet therapy as prescribed Continue management of CAD,  HTN and Hyperlipidemia Evaluated [facility] 08/26/23 -ultrasound shows RICA 1-39% and LICA 40-59% stenosis bilaterally s/p left CEA.  Recommended f/u in 12 months.  Recent CTA (during admission) as outlined.    Aortic atherosclerosis (HCC) Assessment & Plan: Continue crestor.      Coronary artery disease involving native coronary artery of native heart without angina pectoris Assessment & Plan: Remains on crestor.  Currently without symptoms. Continue risk factor modification.       [place], MD

## 2024-02-05 LAB — BASIC METABOLIC PANEL
BUN: 18 mg/dL (ref 6–23)
CO2: 29 meq/L (ref 19–32)
Calcium: 10.4 mg/dL (ref 8.4–10.5)
Chloride: 105 meq/L (ref 96–112)
Creatinine, Ser: 1.21 mg/dL — ABNORMAL HIGH (ref 0.40–1.20)
GFR: 39.7 mL/min — ABNORMAL LOW (ref >60.00)
Glucose, Bld: 95 mg/dL (ref 70–99)
Potassium: 4.1 meq/L (ref 3.5–5.1)
Sodium: 144 meq/L (ref 135–145)

## 2024-02-05 LAB — HEMOGLOBIN A1C: Hgb A1c MFr Bld: 5.6 % (ref 4.6–6.5)

## 2024-02-05 LAB — HEPATIC FUNCTION PANEL
ALT: 30 U/L (ref 0–35)
AST: 38 U/L — ABNORMAL HIGH (ref 0–37)
Albumin: 4.5 g/dL (ref 3.5–5.2)
Alkaline Phosphatase: 46 U/L (ref 39–117)
Bilirubin, Direct: 0.1 mg/dL (ref 0.0–0.3)
Total Bilirubin: 0.5 mg/dL (ref 0.2–1.2)
Total Protein: 7.4 g/dL (ref 6.0–8.3)

## 2024-02-05 LAB — LIPID PANEL
Cholesterol: 137 mg/dL (ref 0–200)
HDL: 61.9 mg/dL (ref >39.00)
LDL Cholesterol: 50 mg/dL (ref 0–99)
NonHDL: 74.62
Total CHOL/HDL Ratio: 2
Triglycerides: 123 mg/dL (ref 0.0–149.0)
VLDL: 24.6 mg/dL (ref 0.0–40.0)

## 2024-02-09 NOTE — Assessment & Plan Note (Signed)
Continues on trazodone.  

## 2024-02-09 NOTE — Assessment & Plan Note (Signed)
 Continue antiplatelet therapy as prescribed Continue management of CAD, HTN and Hyperlipidemia Evaluated [facility] 08/26/23 -ultrasound shows RICA 1-39% and LICA 40-59% stenosis bilaterally s/p left CEA.  Recommended f/u in 12 months.  Recent CTA (during admission) as outlined.

## 2024-02-09 NOTE — Assessment & Plan Note (Signed)
 Follow met b and A1c.  Lab Results  Component Value Date   HGBA1C 5.6 02/04/2024

## 2024-02-09 NOTE — Assessment & Plan Note (Signed)
 Continue to avoid antiinflammatories.  Stay hydrated.  Follow metabolic panel. Off ace inhibitor due to low blood pressure.

## 2024-02-09 NOTE — Assessment & Plan Note (Signed)
 Continue crestor

## 2024-02-09 NOTE — Assessment & Plan Note (Signed)
 Had f/u with Dr Donneta Romberg 01/29/24 - hgb 12. Hold retacrit. Ferrahem monthly. Continue B12 injections.

## 2024-02-09 NOTE — Assessment & Plan Note (Signed)
 Remains on crestor.  Currently without symptoms. Continue risk factor modification.

## 2024-02-09 NOTE — Assessment & Plan Note (Signed)
 Continue crestor.  Low cholesterol diet and exercise.  Follow lipid panel and liver function tests.

## 2024-02-09 NOTE — Assessment & Plan Note (Signed)
 Blood pressure as outlined. Follow pressures.  Currently on midodrine to keep blood pressure at a good level.

## 2024-02-09 NOTE — Assessment & Plan Note (Signed)
 Recent admission as outlined.  W/up as outlined. On eliquis. Continue crestor. Still with residual right visual field deficit.  Evaluated by ophthalmology.

## 2024-02-09 NOTE — Assessment & Plan Note (Signed)
 Leg elevation. Avoid increased sodium intake.

## 2024-02-09 NOTE — Assessment & Plan Note (Signed)
 Saw ophthalmology 01/20/24 - vision loss on the right side of both eyes due to a stroke in the left side of the brain. Planning for f/u in the low vision clinic 02/17/24.

## 2024-02-09 NOTE — Assessment & Plan Note (Signed)
Follow cbc.  Being followed by hematology.

## 2024-02-09 NOTE — Assessment & Plan Note (Signed)
 Continue eliquis  ?

## 2024-02-26 HISTORY — PX: LEFT ATRIAL APPENDAGE OCCLUSION: SHX173A

## 2024-02-28 ENCOUNTER — Telehealth: Payer: Self-pay

## 2024-02-28 NOTE — Transitions of Care (Post Inpatient/ED Visit) (Signed)
 02/28/2024  Name: Jocelyn Elliott MRN: [id number] DOB: 1932-08-17  Today's TOC FU Call Status: Today's TOC FU Call Status:: Successful TOC FU Call Completed TOC FU Call Complete Date: 02/28/24 Patient's Name and Date of Birth confirmed.  Transition Care Management Follow-up Telephone Call Date of Discharge: 02/27/24 Discharge Facility: Other ([facility]) Name of [facility] ([facility]) Discharge Facility: [facility] Type of Discharge: Inpatient Admission Primary Inpatient Discharge Diagnosis:: [place] procedure How have you been since you were released from the hospital?: Better Any questions or concerns?: No  Items Reviewed: Did you receive and understand the discharge instructions provided?: Yes Medications obtained,verified, and reconciled?: Yes (Medications Reviewed) Any new allergies since your discharge?: No Dietary orders reviewed?: Yes Type of Diet Ordered:: Heart Healthy Do you have support at home?: Yes People in Home [RPT]: child(ren), adult Name of Support/Comfort Primary Source: Sharyne Richters  Medications Reviewed Today: Medications Reviewed Today     Reviewed by Redge Gainer, RN (Case Manager) on 02/28/24 at 1255  Med List Status: <None>   Medication Order Taking? Sig Documenting Provider Last Dose Status Informant  apixaban (ELIQUIS) 2.5 MG TABS tablet [id number] No Take 1 tablet (2.5 mg total) by mouth 2 (two) times daily. Lurene Shadow, MD Taking Active   Biotin 5000 MCG CAPS [id number] No Take 1 capsule by mouth daily. [provider] Taking Active Child  cetirizine (ZYRTEC) 10 MG tablet [id number] No Take 10 mg by mouth daily. [provider] Taking Active Child  docusate sodium (COLACE) 100 MG capsule [id number] No Take 100 mg by mouth daily as needed for mild constipation. [provider] Taking Active Child  ferrous sulfate 325 (65 FE) MG EC tablet [id number] No Take 325 mg by mouth every other day. Every other day  [provider] Taking Active Child  finasteride (PROSCAR) 5 MG tablet [id number] No Take 5 mg by mouth daily. [provider] Taking Active Child           Med Note Hardin Negus   Wed Oct 30, 2023 10:57 PM) For alopecia  glucose blood (ONETOUCH ULTRA) test strip [id number] No USE AS INSTRUCTED TO CHECK BLOOD SUGARS ONCE DAILY. DX E11.9 Dale [place], MD Taking Active   hydrocortisone 2.5 % cream [id number] No apply topically 2 times daily as needed for itch at right lower leg Elie Goody, MD Taking Active Child  ketoconazole (NIZORAL) 2 % shampoo [id number] No MASSAGE INTO SCALP AND LET SIT SEVERAL MINUTES BEFORE RINSING. USE 2-3 TIMES A WEEK. Willeen Niece, MD Taking Active Child  Lancet Device MISC [id number] No Use as directed to check blood sugars Dale [place], MD Taking Active Child  Lancets [facility] DELICA PLUS [place]) MISC [id number] No USE AS INSTRUCTED TO CHECK BLOOD SUGARS ONCE DAILY. DX E 11.9 Dale [place], MD Taking Active   metoprolol succinate (TOPROL-XL) 25 MG 24 hr tablet [id number]  Take 25 mg by mouth daily. [provider]  Active   midodrine (PROAMATINE) 5 MG tablet [id number] No Take 1 tablet (5 mg total) by mouth 2 (two) times daily with a meal. Lurene Shadow, MD Taking Active   Omega-3 Fatty Acids (FISH OIL) 1200 MG CAPS [id number] No Take 1 capsule by mouth daily. [provider] Taking Active Child  Probiotic, Lactobacillus, CAPS [id number] No Take 1 capsule by mouth daily. [provider] Taking Active Child  rosuvastatin (CRESTOR) 40 MG tablet [id number] No TAKE 1 TABLET BY MOUTH EVERY DAY Sherlene Shams, MD Taking Active Child  traZODone (DESYREL) 50  MG tablet [id number]  TAKE 1/2 TO 1 TABLET BY MOUTH AT BEDTIME AS NEEDED FOR SLEEP Dale [place], MD  Active   Vitamin D, Ergocalciferol, (DRISDOL) 1.25 MG (50000 UNIT) CAPS capsule [id number] No Take 1 capsule (50,000 Units total) by mouth every 7 (seven) days. Dale [place], MD Taking Active             Home Care and Equipment/Supplies: Were Home Health Services Ordered?: NA Any new equipment or medical supplies ordered?: NA  Functional Questionnaire: Do you need assistance with bathing/showering or dressing?: No Do you need assistance with meal preparation?: Yes (The famil assists with meals) Do you need assistance with eating?: No Do you have difficulty maintaining continence: No Do you need assistance with getting out of bed/getting out of a chair/moving?: No Do you have difficulty managing or taking your medications?: Yes (The daughter helps prepare her meds)  Follow up appointments reviewed: PCP Follow-up appointment confirmed?: NA Specialist Hospital Follow-up appointment confirmed?: No Reason Specialist Follow-Up Not Confirmed: Patient has Specialist Provider Number and will Call for Appointment (Follow up with [facility] specialist) Do you need transportation to your follow-up appointment?: No Do you understand care options if your condition(s) worsen?: Yes-patient verbalized understanding  SDOH Interventions Today    Flowsheet Row Most Recent Value  SDOH Interventions   Food Insecurity Interventions Intervention Not Indicated  Housing Interventions Intervention Not Indicated  Transportation Interventions Intervention Not Indicated  Utilities Interventions Intervention Not Indicated       [name], BSN, RN [place]  [facility] - [facility] RN Care Manager [phone]

## 2024-02-28 NOTE — Transitions of Care (Post Inpatient/ED Visit) (Deleted)
   02/28/2024  Name: Jocelyn Elliott MRN: [id number] DOB: 31-Jul-1932  {[id number]}

## 2024-03-26 ENCOUNTER — Telehealth (INDEPENDENT_AMBULATORY_CARE_PROVIDER_SITE_OTHER): Admitting: Internal Medicine

## 2024-03-26 DIAGNOSIS — I2510 Atherosclerotic heart disease of native coronary artery without angina pectoris: Secondary | ICD-10-CM

## 2024-03-26 DIAGNOSIS — D649 Anemia, unspecified: Secondary | ICD-10-CM | POA: Diagnosis not present

## 2024-03-26 DIAGNOSIS — R9389 Abnormal findings on diagnostic imaging of other specified body structures: Secondary | ICD-10-CM

## 2024-03-26 DIAGNOSIS — I10 Essential (primary) hypertension: Secondary | ICD-10-CM

## 2024-03-26 DIAGNOSIS — E1159 Type 2 diabetes mellitus with other circulatory complications: Secondary | ICD-10-CM

## 2024-03-26 NOTE — Progress Notes (Signed)
 Patient ID: Jocelyn Elliott, female   DOB: [birth year], 89 y.o.   MRN: [id number]   Virtual Visit via video/telehpone Note  I connected with Jocelyn Elliott by a video enabled telemedicine application or telephone and verified that I am speaking with the correct person using two identifiers. Location patient: home Location provider: work or home office Persons participating in the virtual visit: patient, provider and her daughter Jocelyn Elliott.   The limitations, risks, security and privacy concerns of performing an evaluation and management service by video/telephone and the availability of in person appointments have been discussed. It has also been discussed with the patient that there may be a patient responsible charge related to this service. The patient expressed understanding and agreed to proceed.  Interactive audio and video telecommunications were attempted between this provider and patient, however failed, due to patient having technical difficulties OR patient did not have access to video capability.  We continued and completed visit with audio only.   Reason for visit: work in appt  HPI: Work in appt to discuss recent scan. She is s/p watchmen procedure. CT scan performed revealed some scattered mediastinal nodes which were felt to possibly be reactive. Radiology recommended 3 month f/u CT scan. Also there was noted to be some dilatation of the pancreas. Recommended pancreatic MRI protocol. Discussed above results. Discussed further w/up. She wants to hold on further testing/scanning. States she is dong relatively well. Breathing stable. No increased cough or congestion.    ROS: See pertinent positives and negatives per HPI.  Past Medical History:  Diagnosis Date   Arthritis    AV malformation of gastrointestinal tract    Basal cell carcinoma 08/07/2023   Central upper forehead. Nodular pattern. Mohs 09/09/23   Blood in stool    CAD (coronary artery disease)    Carotid arterial  disease (HCC)    Cataracts, bilateral    Chronic blood loss anemia    Chronic cystitis    CKD (chronic kidney disease), stage III (HCC)    Complication of anesthesia    reaction to propofol  - caused heart to pause   Depression    Diabetes mellitus (HCC)    GERD (gastroesophageal reflux disease)    Hyperlipidemia    Hypertension    IDA (iron deficiency anemia)    Stress incontinence    TIA (transient ischemic attack) 03/2021   No deficits   Wears dentures    full upper and lower    Past Surgical History:  Procedure Laterality Date   ABDOMINAL HYSTERECTOMY  11/19/1970   ovaries left in place   APPENDECTOMY  11/19/1990   BACK SURGERY  06/08/2010   L3, L4, L5   CARDIAC CATHETERIZATION  02/2001   CAROTID ARTERY ANGIOPLASTY  09/19/2002   CARPAL TUNNEL RELEASE  1991, 92   right then left    CATARACT EXTRACTION W/PHACO Left 02/13/2022   Procedure: CATARACT EXTRACTION PHACO AND INTRAOCULAR LENS PLACEMENT (IOC) LEFT DIABETIC 14.90 01:26.8;  Surgeon: Clair Crews, MD;  Location[facility];  Service: Ophthalmology;  Laterality: Left;  Diabetic   CATARACT EXTRACTION W/PHACO Right 03/06/2022   Procedure: CATARACT EXTRACTION PHACO AND INTRAOCULAR LENS PLACEMENT (IOC) RIGHT DIABETIC 9.66 00:53.7;  Surgeon: Clair Crews, MD;  Location: [facility];  Service: Ophthalmology;  Laterality: Right;  Diabetic   CHOLECYSTECTOMY  11/20/1991   COLONOSCOPY WITH PROPOFOL  N/A 02/02/2019   Procedure: COLONOSCOPY WITH PROPOFOL ;  Surgeon: Cassie Click, MD;  Location: [facility] END[facility];  Service: Endoscopy;  Laterality: N/A;  ESOPHAGOGASTRODUODENOSCOPY N/A 02/02/2019   Procedure: ESOPHAGOGASTRODUODENOSCOPY (EGD);  Surgeon: Cassie Click, MD;  Location: [facility] END[facility];  Service: Endoscopy;  Laterality: N/A;   FOOT SURGERY  06/20/2007   Left   right carotid artery surgery  01/09/2013   Dr. Prescilla Brod @ [facility]   TOTAL HIP ARTHROPLASTY  05/03/2011   left 12, right 11/07     Family History  Problem Relation Age of Onset   Hyperlipidemia Father    Heart disease Father        myocardial infarction   Hypertension Father        Parent   Arthritis Other        parent   Diabetes Other        nephew   Cervical cancer Sister    Rectal cancer Sister    Breast cancer Neg Hx     SOCIAL HX: reviewed.    Current Outpatient Medications:    apixaban  (ELIQUIS ) 2.5 MG TABS tablet, Take 1 tablet (2.5 mg total) by mouth 2 (two) times daily., Disp: 60 tablet, Rfl: 0   Biotin  5000 MCG CAPS, Take 1 capsule by mouth daily., Disp: , Rfl:    cetirizine (ZYRTEC) 10 MG tablet, Take 10 mg by mouth daily., Disp: , Rfl:    docusate sodium  (COLACE) 100 MG capsule, Take 100 mg by mouth daily as needed for mild constipation., Disp: , Rfl:    ferrous sulfate  325 (65 FE) MG EC tablet, Take 325 mg by mouth every other day. Every other day, Disp: , Rfl:    finasteride  (PROSCAR ) 5 MG tablet, Take 5 mg by mouth daily., Disp: , Rfl:    glucose blood (ONETOUCH ULTRA) test strip, USE AS INSTRUCTED TO CHECK BLOOD SUGARS ONCE DAILY. DX E11.9, Disp: 100 strip, Rfl: 12   hydrocortisone  2.5 % cream, apply topically 2 times daily as needed for itch at right lower leg, Disp: 30 g, Rfl: 5   ketoconazole  (NIZORAL ) 2 % shampoo, MASSAGE INTO SCALP AND LET SIT SEVERAL MINUTES BEFORE RINSING. USE 2-3 TIMES A WEEK., Disp: 120 mL, Rfl: 2   Lancet Device MISC, Use as directed to check blood sugars, Disp: 1 each, Rfl: 0   Lancets (ONETOUCH DELICA PLUS LANCET30G) MISC, USE AS INSTRUCTED TO CHECK BLOOD SUGARS ONCE DAILY. DX E 11.9, Disp: 100 each, Rfl: 12   metoprolol  succinate (TOPROL -XL) 25 MG 24 hr tablet, Take 25 mg by mouth daily., Disp: , Rfl:    midodrine  (PROAMATINE ) 5 MG tablet, Take 1 tablet (5 mg total) by mouth 2 (two) times daily with a meal., Disp: 60 tablet, Rfl: 0   Omega-3 Fatty Acids (FISH OIL) 1200 MG CAPS, Take 1 capsule by mouth daily., Disp: , Rfl:    Probiotic, Lactobacillus, CAPS,  Take 1 capsule by mouth daily., Disp: , Rfl:    rosuvastatin  (CRESTOR ) 40 MG tablet, TAKE 1 TABLET BY MOUTH EVERY DAY, Disp: 90 tablet, Rfl: 1   traZODone  (DESYREL ) 50 MG tablet, TAKE 1/2 TO 1 TABLET BY MOUTH AT BEDTIME AS NEEDED FOR SLEEP, Disp: 90 tablet, Rfl: 1   Vitamin D , Ergocalciferol , (DRISDOL ) 1.25 MG (50000 UNIT) CAPS capsule, Take 1 capsule (50,000 Units total) by mouth every 7 (seven) days., Disp: 12 capsule, Rfl: 0  EXAM:  GENERAL: alert, oriented, sounds to be in no acute distress.   PSYCH/NEURO: pleasant and cooperative, no obvious depression or anxiety, speech and thought processing grossly intact  ASSESSMENT AND PLAN:  Discussed the following assessment and plan:  Problem List Items  Addressed This Visit     Abnormal chest CT - Primary   Discussed recent CT scan results. Discussed findings of scattered mediastinal nodes - felt likely to be reactive. Recommended f/u CT in 3 months. Also discussed the finding of dilatation of the pancreas. Recommended dedicated pancreatic MRI protocol. Discussed recommendations and further scanning. She declines. Will notify me if changes her mind. Eating. No nausea or vomiting. No abdominal pain.       Anemia   Followed by hematology.  History of AVM.  Also with CKD.  Receiving IV iron.  Followed by Dr Valentine Gasmen.  On eliquis  now.  Will need closely monitor for any bleeding.  Follow hgb.        CAD (coronary artery disease)   Remains on crestor .  Currently without symptoms. Continue risk factor modifications.       Diabetes mellitus (HCC)   Follow met b and A1c.  Lab Results  Component Value Date   HGBA1C 5.6 02/04/2024         Hypertension   Blood pressure as outlined. Follow pressures.  Currently on midodrine  to keep blood pressure at a good level. No changes today.        Return if symptoms worsen or fail to improve.   I discussed the assessment and treatment plan with the patient. The patient was provided an opportunity  to ask questions and all were answered. The patient agreed with the plan and demonstrated an understanding of the instructions.   The patient was advised to call back or seek an in-person evaluation if the symptoms worsen or if the condition fails to improve as anticipated.  I provided 18 minutes of non-face-to-face time during this encounter.   Dellar Fenton, MD

## 2024-03-29 DIAGNOSIS — R9389 Abnormal findings on diagnostic imaging of other specified body structures: Secondary | ICD-10-CM | POA: Insufficient documentation

## 2024-03-29 NOTE — Assessment & Plan Note (Signed)
 Followed by hematology.  History of AVM.  Also with CKD.  Receiving IV iron.  Followed by Dr Valentine Gasmen.  On eliquis  now.  Will need closely monitor for any bleeding.  Follow hgb.

## 2024-03-29 NOTE — Assessment & Plan Note (Signed)
 Remains on crestor .  Currently without symptoms. Continue risk factor modifications.

## 2024-03-29 NOTE — Assessment & Plan Note (Signed)
 Discussed recent CT scan results. Discussed findings of scattered mediastinal nodes - felt likely to be reactive. Recommended f/u CT in 3 months. Also discussed the finding of dilatation of the pancreas. Recommended dedicated pancreatic MRI protocol. Discussed recommendations and further scanning. She declines. Will notify me if changes her mind. Eating. No nausea or vomiting. No abdominal pain.

## 2024-03-29 NOTE — Assessment & Plan Note (Signed)
 Follow met b and A1c.  Lab Results  Component Value Date   HGBA1C 5.6 02/04/2024

## 2024-03-29 NOTE — Assessment & Plan Note (Signed)
 Blood pressure as outlined. Follow pressures.  Currently on midodrine  to keep blood pressure at a good level. No changes today.

## 2024-03-30 ENCOUNTER — Telehealth: Payer: Self-pay

## 2024-03-30 NOTE — Telephone Encounter (Signed)
 Copied from CRM [id number]. Topic: General - Other >> Mar 30, 2024 10:51 AM [place] J wrote: Reason for CRM: Austine Lefort calling in from [facility] wondering if there was an update on a fax for certification and plan of care. I let her know that there was not a new update. She will be resending the original fax.

## 2024-03-31 DIAGNOSIS — I129 Hypertensive chronic kidney disease with stage 1 through stage 4 chronic kidney disease, or unspecified chronic kidney disease: Secondary | ICD-10-CM

## 2024-03-31 DIAGNOSIS — H53451 Other localized visual field defect, right eye: Secondary | ICD-10-CM | POA: Diagnosis not present

## 2024-03-31 DIAGNOSIS — E1159 Type 2 diabetes mellitus with other circulatory complications: Secondary | ICD-10-CM

## 2024-03-31 DIAGNOSIS — D631 Anemia in chronic kidney disease: Secondary | ICD-10-CM

## 2024-03-31 DIAGNOSIS — I480 Paroxysmal atrial fibrillation: Secondary | ICD-10-CM

## 2024-03-31 DIAGNOSIS — I69311 Memory deficit following cerebral infarction: Secondary | ICD-10-CM | POA: Diagnosis not present

## 2024-03-31 DIAGNOSIS — N1832 Chronic kidney disease, stage 3b: Secondary | ICD-10-CM

## 2024-03-31 DIAGNOSIS — I69341 Monoplegia of lower limb following cerebral infarction affecting right dominant side: Secondary | ICD-10-CM | POA: Diagnosis not present

## 2024-03-31 DIAGNOSIS — I770 Arteriovenous fistula, acquired: Secondary | ICD-10-CM

## 2024-03-31 DIAGNOSIS — E1122 Type 2 diabetes mellitus with diabetic chronic kidney disease: Secondary | ICD-10-CM

## 2024-03-31 DIAGNOSIS — I69398 Other sequelae of cerebral infarction: Secondary | ICD-10-CM | POA: Diagnosis not present

## 2024-03-31 DIAGNOSIS — I700 Atherosclerosis of aorta: Secondary | ICD-10-CM

## 2024-04-01 ENCOUNTER — Inpatient Hospital Stay

## 2024-04-01 ENCOUNTER — Inpatient Hospital Stay (HOSPITAL_BASED_OUTPATIENT_CLINIC_OR_DEPARTMENT_OTHER): Admitting: Internal Medicine

## 2024-04-01 ENCOUNTER — Encounter: Payer: Self-pay | Admitting: Internal Medicine

## 2024-04-01 ENCOUNTER — Inpatient Hospital Stay: Attending: Internal Medicine

## 2024-04-01 VITALS — BP 139/86 | HR 71 | Temp 97.3°F | Resp 16 | Ht 59.0 in | Wt 136.8 lb

## 2024-04-01 VITALS — BP 155/87 | HR 66 | Resp 18

## 2024-04-01 DIAGNOSIS — D631 Anemia in chronic kidney disease: Secondary | ICD-10-CM | POA: Diagnosis present

## 2024-04-01 DIAGNOSIS — N183 Chronic kidney disease, stage 3 unspecified: Secondary | ICD-10-CM

## 2024-04-01 DIAGNOSIS — E538 Deficiency of other specified B group vitamins: Secondary | ICD-10-CM | POA: Insufficient documentation

## 2024-04-01 DIAGNOSIS — D649 Anemia, unspecified: Secondary | ICD-10-CM

## 2024-04-01 DIAGNOSIS — D508 Other iron deficiency anemias: Secondary | ICD-10-CM

## 2024-04-01 LAB — CBC WITH DIFFERENTIAL (CANCER CENTER ONLY)
Abs Immature Granulocytes: 0.03 10*3/uL (ref 0.00–0.07)
Basophils Absolute: 0.1 10*3/uL (ref 0.0–0.1)
Basophils Relative: 1 %
Eosinophils Absolute: 0.1 10*3/uL (ref 0.0–0.5)
Eosinophils Relative: 2 %
HCT: 39 % (ref 36.0–46.0)
Hemoglobin: 12.8 g/dL (ref 12.0–15.0)
Immature Granulocytes: 0 %
Lymphocytes Relative: 18 %
Lymphs Abs: 1.3 10*3/uL (ref 0.7–4.0)
MCH: 34 pg (ref 26.0–34.0)
MCHC: 32.8 g/dL (ref 30.0–36.0)
MCV: 103.4 fL — ABNORMAL HIGH (ref 80.0–100.0)
Monocytes Absolute: 0.4 10*3/uL (ref 0.1–1.0)
Monocytes Relative: 6 %
Neutro Abs: 5.2 10*3/uL (ref 1.7–7.7)
Neutrophils Relative %: 73 %
Platelet Count: 182 10*3/uL (ref 150–400)
RBC: 3.77 MIL/uL — ABNORMAL LOW (ref 3.87–5.11)
RDW: 12.9 % (ref 11.5–15.5)
WBC Count: 7.1 10*3/uL (ref 4.0–10.5)
nRBC: 0 % (ref 0.0–0.2)

## 2024-04-01 LAB — IRON AND TIBC
Iron: 106 ug/dL (ref 28–170)
Saturation Ratios: 30 % (ref 10.4–31.8)
TIBC: 356 ug/dL (ref 250–450)
UIBC: 250 ug/dL

## 2024-04-01 LAB — BASIC METABOLIC PANEL WITH GFR
Anion gap: 9 (ref 5–15)
BUN: 24 mg/dL — ABNORMAL HIGH (ref 8–23)
CO2: 22 mmol/L (ref 22–32)
Calcium: 9.2 mg/dL (ref 8.9–10.3)
Chloride: 107 mmol/L (ref 98–111)
Creatinine, Ser: 1.18 mg/dL — ABNORMAL HIGH (ref 0.44–1.00)
GFR, Estimated: 44 mL/min — ABNORMAL LOW (ref >60)
Glucose, Bld: 127 mg/dL — ABNORMAL HIGH (ref 70–99)
Potassium: 3.5 mmol/L (ref 3.5–5.1)
Sodium: 138 mmol/L (ref 135–145)

## 2024-04-01 LAB — FERRITIN: Ferritin: 71 ng/mL (ref 11–307)

## 2024-04-01 MED ORDER — SODIUM CHLORIDE 0.9 % IV SOLN
510.0000 mg | Freq: Once | INTRAVENOUS | Status: AC
  Administered 2024-04-01: 510 mg via INTRAVENOUS
  Filled 2024-04-01: qty 510

## 2024-04-01 MED ORDER — SODIUM CHLORIDE 0.9 % IV SOLN
Freq: Once | INTRAVENOUS | Status: AC
Start: 2024-04-01 — End: 2024-04-01
  Filled 2024-04-01: qty 250

## 2024-04-01 MED ORDER — CYANOCOBALAMIN 1000 MCG/ML IJ SOLN
1000.0000 ug | Freq: Once | INTRAMUSCULAR | Status: AC
  Administered 2024-04-01: 1000 ug via INTRAMUSCULAR
  Filled 2024-04-01: qty 1

## 2024-04-01 NOTE — Assessment & Plan Note (Addendum)
#  Severe anemia hemoglobin-nadir 6 [summer 2020]. 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 JUNE 2023- NEGATIVE for hemolysis [haptoglobin-WNL]  # Today hemoglobin is 12.8  HOLD Retacrit  today.-- proceed with Ferrahem- monthly. Stable.   # B12 def- continue B12 injections Proceed with injection today. Stable.    # CKD stage III- Recommend continued p.o. intake. Stable.   # A.fib/Stroke [Dec 2024] OFF plavix  [Dr.Shah/NP] Dr.Thomas s/p watchman device-[ APRIL 2025]- on eliquisx 6 weeks-   Monthly B12; Ferra/retacrit  q m  # DISPOSITION:  # proceed with Ferrahem today;   # HOLD RETACRIT ;   # Ok with B 12 injection # in 2  month  MD-  labs- -cbc;bmp;iron studies;ferriitn; b12 - - possible Ferrahem or possible retacrit ; B12 injection- Dr.B

## 2024-04-01 NOTE — Progress Notes (Signed)
 Fatigue/weakness: YES/NO ENERGY Dyspena: YES WITH EXERTION Light headedness: NO Blood in stool: NO  05/27/24 post op echo, [facility] Dr. Andy Bannister.

## 2024-04-01 NOTE — Progress Notes (Signed)
 [facility] OFFICE PROGRESS NOTE  Patient Care Team: Dellar Fenton, MD as PCP - General (Internal Medicine) Dellar Fenton, MD (Internal Medicine) Percival Brace, MD (Internal Medicine) Artemio Larry, MD (Specialist) Schnier, Ninette Basque, MD (Vascular Surgery) Percival Brace, MD (Internal Medicine) Artemio Larry, MD (Specialist) Schnier, Ninette Basque, MD (Vascular Surgery) Gwyn Leos, MD as Consulting Physician (Oncology)   SUMMARY OF HEMATOLOGIC HISTORY:  # IRON DEFICIENCY ANEMIA- recurrent [? GI blood loss; Dr.Elliot; AVM-capsule]; IV Ferrahem q 15m; last colo- 2012/march; EGD- [id number].  July 2020 bone marrow biopsy-mild dysplastic changes; 40% hypercellularity; cytogenetics-normal.-Retacrit .MARCH 2023- Foundation One Hem- TP53 subclonal. B12 def- FEB 2023- Start B12 injections Monthly; MAY 3rd, 2023- Start Retacrit  20K [place].   #Colonoscopy-April 2020 aborted because of cardiac pause.  # CKD stage III-IV; CAD-Dr. Parks Bollman;  March 30th, 2022-TIA/slurred spleech [Dr.Shah]; dec 2024- affecting right eye and memory.  INTERVAL HISTORY: In a wheelchair.  With her daughter.   89 year old female patient with above history of recurrent iron deficiency anemia unclear etiology/?-related to CKD/ A.fib- eliquis  2.5 mg BID;stroke affecting right eye and memory -on Feraheme /Retacrit  is here for follow-up.  Pt on April 9th at [facility] Dr. Andy Bannister  had a watchman device placed. She continues to be on eliquis - with plan to stop eliquis  in next few weeks.  Otherwise no blood in stools or black-colored stools.  Review of Systems  Constitutional:  Positive for malaise/fatigue. Negative for chills, diaphoresis, fever and weight loss.  HENT:  Negative for nosebleeds and sore throat.   Eyes:  Negative for double vision.  Respiratory:  Negative for cough, hemoptysis, sputum production and wheezing.   Cardiovascular:  Negative for palpitations, orthopnea and leg swelling.   Gastrointestinal:  Negative for abdominal pain, blood in stool, constipation, diarrhea, heartburn, melena, nausea and vomiting.  Genitourinary:  Negative for dysuria, frequency and urgency.  Musculoskeletal:  Negative for back pain and joint pain.  Skin: Negative.  Negative for itching and rash.  Neurological:  Negative for dizziness, tingling, focal weakness, weakness and headaches.  Endo/Heme/Allergies:  Does not bruise/bleed easily.  Psychiatric/Behavioral:  Negative for depression. The patient is not nervous/anxious and does not have insomnia.      PAST MEDICAL HISTORY :  Past Medical History:  Diagnosis Date   Arthritis    AV malformation of gastrointestinal tract    Basal cell carcinoma 08/07/2023   Central upper forehead. Nodular pattern. Mohs 09/09/23   Blood in stool    CAD (coronary artery disease)    Carotid arterial disease (HCC)    Cataracts, bilateral    Chronic blood loss anemia    Chronic cystitis    CKD (chronic kidney disease), stage III (HCC)    Complication of anesthesia    reaction to propofol  - caused heart to pause   Depression    Diabetes mellitus (HCC)    GERD (gastroesophageal reflux disease)    Hyperlipidemia    Hypertension    IDA (iron deficiency anemia)    Stress incontinence    TIA (transient ischemic attack) 03/2021   No deficits   Wears dentures    full upper and lower    PAST SURGICAL HISTORY :   Past Surgical History:  Procedure Laterality Date   ABDOMINAL HYSTERECTOMY  11/19/1970   ovaries left in place   APPENDECTOMY  11/19/1990   BACK SURGERY  06/08/2010   L3, L4, L5   CARDIAC CATHETERIZATION  02/2001   CAROTID ARTERY ANGIOPLASTY  09/19/2002   CARPAL TUNNEL RELEASE  1991,  92   right then left    CATARACT EXTRACTION W/PHACO Left 02/13/2022   Procedure: CATARACT EXTRACTION PHACO AND INTRAOCULAR LENS PLACEMENT (IOC) LEFT DIABETIC 14.90 01:26.8;  Surgeon: Clair Crews, MD;  Location: [facility];  Service:  Ophthalmology;  Laterality: Left;  Diabetic   CATARACT EXTRACTION W/PHACO Right 03/06/2022   Procedure: CATARACT EXTRACTION PHACO AND INTRAOCULAR LENS PLACEMENT (IOC) RIGHT DIABETIC 9.66 00:53.7;  Surgeon: Clair Crews, MD;  Location: [facility];  Service: Ophthalmology;  Laterality: Right;  Diabetic   CHOLECYSTECTOMY  11/20/1991   COLONOSCOPY WITH PROPOFOL  N/A 02/02/2019   Procedure: COLONOSCOPY WITH PROPOFOL ;  Surgeon: Cassie Click, MD;  Location: [facility] END[facility];  Service: Endoscopy;  Laterality: N/A;   ESOPHAGOGASTRODUODENOSCOPY N/A 02/02/2019   Procedure: ESOPHAGOGASTRODUODENOSCOPY (EGD);  Surgeon: Cassie Click, MD;  Location: [facility] ENDOSCOPY;  Service: Endoscopy;  Laterality: N/A;   FOOT SURGERY  06/20/2007   Left   right carotid artery surgery  01/09/2013   Dr. Prescilla Brod @ [facility]   TOTAL HIP ARTHROPLASTY  05/03/2011   left 12, right 11/07    FAMILY HISTORY :   Family History  Problem Relation Age of Onset   Hyperlipidemia Father    Heart disease Father        myocardial infarction   Hypertension Father        Parent   Arthritis Other        parent   Diabetes Other        nephew   Cervical cancer Sister    Rectal cancer Sister    Breast cancer Neg Hx     SOCIAL HISTORY:   Social History   Tobacco Use   Smoking status: Former    Current packs/day: 0.00    Types: Cigarettes    Quit date: 11/19/1989    Years since quitting: 34.3   Smokeless tobacco: Never  Vaping Use   Vaping status: Never Used  Substance Use Topics   Alcohol use: No    Alcohol/week: 0.0 standard drinks of alcohol   Drug use: No    ALLERGIES:  is allergic to propofol , ciprofloxacin, levaquin [levofloxacin], and tequin [gatifloxacin].  MEDICATIONS:  Current Outpatient Medications  Medication Sig Dispense Refill   apixaban  (ELIQUIS ) 2.5 MG TABS tablet Take 1 tablet (2.5 mg total) by mouth 2 (two) times daily. 60 tablet 0   Biotin  5000 MCG CAPS Take 1 capsule by mouth daily.      cetirizine (ZYRTEC) 10 MG tablet Take 10 mg by mouth daily.     Cholecalciferol (VITAMIN D -3) 25 MCG (1000 UT) CAPS Take 1,000 Units by mouth daily.     docusate sodium  (COLACE) 100 MG capsule Take 100 mg by mouth daily as needed for mild constipation.     ferrous sulfate  325 (65 FE) MG EC tablet Take 325 mg by mouth every other day. Every other day     glucose blood (ONETOUCH ULTRA) test strip USE AS INSTRUCTED TO CHECK BLOOD SUGARS ONCE DAILY. DX E11.9 100 strip 12   hydrocortisone  2.5 % cream apply topically 2 times daily as needed for itch at right lower leg 30 g 5   ketoconazole  (NIZORAL ) 2 % shampoo MASSAGE INTO SCALP AND LET SIT SEVERAL MINUTES BEFORE RINSING. USE 2-3 TIMES A WEEK. 120 mL 2   Lancet Device MISC Use as directed to check blood sugars 1 each 0   Lancets (ONETOUCH DELICA PLUS LANCET30G) MISC USE AS INSTRUCTED TO CHECK BLOOD SUGARS ONCE DAILY. DX E 11.9 100 each  12   metoprolol  succinate (TOPROL -XL) 25 MG 24 hr tablet Take 12.5 mg by mouth daily.     midodrine  (PROAMATINE ) 5 MG tablet Take 1 tablet (5 mg total) by mouth 2 (two) times daily with a meal. (Patient taking differently: Take 2.5 mg by mouth 2 (two) times daily with a meal.) 60 tablet 0   Omega-3 Fatty Acids (FISH OIL) 1200 MG CAPS Take 1 capsule by mouth daily.     Probiotic, Lactobacillus, CAPS Take 1 capsule by mouth daily.     rosuvastatin  (CRESTOR ) 40 MG tablet TAKE 1 TABLET BY MOUTH EVERY DAY 90 tablet 1   traZODone  (DESYREL ) 50 MG tablet TAKE 1/2 TO 1 TABLET BY MOUTH AT BEDTIME AS NEEDED FOR SLEEP 90 tablet 1   No current facility-administered medications for this visit.   Facility-Administered Medications Ordered in Other Visits  Medication Dose Route Frequency Provider Last Rate Last Admin   0.9 %  sodium chloride  infusion   Intravenous Once Gadiel John R, MD       cyanocobalamin  (VITAMIN B12) injection 1,000 mcg  1,000 mcg Intramuscular Once Lue Sykora R, MD       ferumoxytol  (FERAHEME )  510 mg in sodium chloride  0.9 % 100 mL IVPB  510 mg Intravenous Once Lakai Moree R, MD        PHYSICAL EXAMINATION:   BP 139/86 (BP Location: Left Arm, Patient Position: Sitting, Cuff Size: Normal)   Pulse 71   Temp (!) 97.3 F (36.3 C) (Tympanic)   Resp 16   Ht 4\' 11"  (1.499 m)   Wt 136 lb 12.8 oz (62.1 kg)   SpO2 97%   BMI 27.63 kg/m   Filed Weights   04/01/24 1327  Weight: 136 lb 12.8 oz (62.1 kg)       Physical Exam Constitutional:      Comments: [place] Caucasian female patient.  She is in a wheelchair.  HENT:     Head: Normocephalic and atraumatic.     Mouth/Throat:     Pharynx: No oropharyngeal exudate.  Eyes:     Pupils: Pupils are equal, round, and reactive to light.     Comments: Positive for pallor.  Cardiovascular:     Rate and Rhythm: Normal rate and regular rhythm.     Heart sounds: Murmur heard.  Pulmonary:     Effort: No respiratory distress.     Breath sounds: No wheezing.  Abdominal:     General: Bowel sounds are normal. There is no distension.     Palpations: Abdomen is soft. There is no mass.     Tenderness: There is no abdominal tenderness. There is no guarding or rebound.  Musculoskeletal:        General: No tenderness. Normal range of motion.     Cervical back: Normal range of motion and neck supple.  Skin:    General: Skin is warm.  Neurological:     Mental Status: She is alert and oriented to person, place, and time.  Psychiatric:        Mood and Affect: Affect normal.     LABORATORY DATA:  I have reviewed the data as listed    Component Value Date/Time   NA 138 04/01/2024 1311   NA 143 01/10/2013 0314   K 3.5 04/01/2024 1311   K 4.0 01/10/2013 0314   CL 107 04/01/2024 1311   CL 109 (H) 01/10/2013 0314   CO2 22 04/01/2024 1311   CO2 27 01/10/2013 0314   GLUCOSE 127 (  H) 04/01/2024 1311   GLUCOSE 119 (H) 01/10/2013 0314   BUN 24 (H) 04/01/2024 1311   BUN 15 01/10/2013 0314   CREATININE 1.18 (H) 04/01/2024  1311   CREATININE 1.11 (H) 07/08/2023 1240   CREATININE 1.03 01/10/2013 0314   CREATININE 1.13 (H) 09/18/2012 0848   CALCIUM  9.2 04/01/2024 1311   CALCIUM  8.1 (L) 01/10/2013 0314   PROT 7.4 02/04/2024 1505   PROT 7.2 12/20/2011 1521   ALBUMIN 4.5 02/04/2024 1505   ALBUMIN 3.9 12/20/2011 1521   AST 38 (H) 02/04/2024 1505   AST 19 07/08/2023 1240   ALT 30 02/04/2024 1505   ALT 15 07/08/2023 1240   ALT 37 12/20/2011 1521   ALKPHOS 46 02/04/2024 1505   ALKPHOS 57 12/20/2011 1521   BILITOT 0.5 02/04/2024 1505   BILITOT 0.5 07/08/2023 1240   GFRNONAA 44 (L) 04/01/2024 1311   GFRNONAA 48 (L) 07/08/2023 1240   GFRNONAA 52 (L) 01/10/2013 0314   GFRNONAA 47 (L) 09/18/2012 0848   GFRAA 46 (L) 07/12/2020 1240   GFRAA >60 01/10/2013 0314   GFRAA 54 (L) 09/18/2012 0848    No results found for: "SPEP", "UPEP"  Lab Results  Component Value Date   WBC 7.1 04/01/2024   NEUTROABS 5.2 04/01/2024   HGB 12.8 04/01/2024   HCT 39.0 04/01/2024   MCV 103.4 (H) 04/01/2024   PLT 182 04/01/2024      Chemistry      Component Value Date/Time   NA 138 04/01/2024 1311   NA 143 01/10/2013 0314   K 3.5 04/01/2024 1311   K 4.0 01/10/2013 0314   CL 107 04/01/2024 1311   CL 109 (H) 01/10/2013 0314   CO2 22 04/01/2024 1311   CO2 27 01/10/2013 0314   BUN 24 (H) 04/01/2024 1311   BUN 15 01/10/2013 0314   CREATININE 1.18 (H) 04/01/2024 1311   CREATININE 1.11 (H) 07/08/2023 1240   CREATININE 1.03 01/10/2013 0314   CREATININE 1.13 (H) 09/18/2012 0848      Component Value Date/Time   CALCIUM  9.2 04/01/2024 1311   CALCIUM  8.1 (L) 01/10/2013 0314   ALKPHOS 46 02/04/2024 1505   ALKPHOS 57 12/20/2011 1521   AST 38 (H) 02/04/2024 1505   AST 19 07/08/2023 1240   ALT 30 02/04/2024 1505   ALT 15 07/08/2023 1240   ALT 37 12/20/2011 1521   BILITOT 0.5 02/04/2024 1505   BILITOT 0.5 07/08/2023 1240      ASSESSMENT & PLAN:   Anemia of chronic kidney failure, stage 3 (moderate) (HCC) #Severe anemia  hemoglobin-nadir 6 [summer 2020]. 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 JUNE 2023- NEGATIVE for hemolysis [haptoglobin-WNL]  # Today hemoglobin is 12.8  HOLD Retacrit  today.-- proceed with Ferrahem- monthly. Stable.   # B12 def- continue B12 injections Proceed with injection today. Stable.    # CKD stage III- Recommend continued p.o. intake. Stable.   # A.fib/Stroke [Dec 2024] OFF plavix  [Dr.Shah/NP] Dr.Thomas s/p watchman device-[ APRIL 2025]- on eliquisx 6 weeks-   Monthly B12; Ferra/retacrit  q m  # DISPOSITION:  # proceed with Ferrahem today;   # HOLD RETACRIT ;   # Ok with B 12 injection # in 2  month  MD-  labs- -cbc;bmp;iron studies;ferriitn; b12 - - possible Ferrahem or possible retacrit ; B12 injection- Dr.B       Gwyn Leos, MD 04/01/2024 2:31 PM

## 2024-04-02 NOTE — Telephone Encounter (Signed)
Holding for fax

## 2024-04-03 NOTE — Telephone Encounter (Signed)
 Signed and placed in box.

## 2024-04-03 NOTE — Telephone Encounter (Signed)
 Placed out with the plan of cares for your signature

## 2024-04-03 NOTE — Telephone Encounter (Signed)
Plan of care faxed.

## 2024-04-07 ENCOUNTER — Ambulatory Visit: Admitting: Internal Medicine

## 2024-04-07 ENCOUNTER — Encounter (INDEPENDENT_AMBULATORY_CARE_PROVIDER_SITE_OTHER): Payer: Self-pay

## 2024-04-30 ENCOUNTER — Other Ambulatory Visit: Payer: Self-pay | Admitting: Internal Medicine

## 2024-05-11 ENCOUNTER — Ambulatory Visit: Admitting: Internal Medicine

## 2024-05-11 VITALS — BP 118/70 | HR 78 | Temp 98.0°F | Resp 16 | Ht 59.0 in | Wt 136.0 lb

## 2024-05-11 DIAGNOSIS — I480 Paroxysmal atrial fibrillation: Secondary | ICD-10-CM

## 2024-05-11 DIAGNOSIS — E7800 Pure hypercholesterolemia, unspecified: Secondary | ICD-10-CM

## 2024-05-11 DIAGNOSIS — I2510 Atherosclerotic heart disease of native coronary artery without angina pectoris: Secondary | ICD-10-CM

## 2024-05-11 DIAGNOSIS — D696 Thrombocytopenia, unspecified: Secondary | ICD-10-CM

## 2024-05-11 DIAGNOSIS — K1370 Unspecified lesions of oral mucosa: Secondary | ICD-10-CM

## 2024-05-11 DIAGNOSIS — N1832 Chronic kidney disease, stage 3b: Secondary | ICD-10-CM

## 2024-05-11 DIAGNOSIS — I700 Atherosclerosis of aorta: Secondary | ICD-10-CM

## 2024-05-11 DIAGNOSIS — E1159 Type 2 diabetes mellitus with other circulatory complications: Secondary | ICD-10-CM | POA: Diagnosis not present

## 2024-05-11 DIAGNOSIS — I10 Essential (primary) hypertension: Secondary | ICD-10-CM | POA: Diagnosis not present

## 2024-05-11 DIAGNOSIS — R9389 Abnormal findings on diagnostic imaging of other specified body structures: Secondary | ICD-10-CM

## 2024-05-11 DIAGNOSIS — Z8673 Personal history of transient ischemic attack (TIA), and cerebral infarction without residual deficits: Secondary | ICD-10-CM

## 2024-05-11 DIAGNOSIS — D649 Anemia, unspecified: Secondary | ICD-10-CM

## 2024-05-11 DIAGNOSIS — I779 Disorder of arteries and arterioles, unspecified: Secondary | ICD-10-CM

## 2024-05-11 LAB — HM DIABETES FOOT EXAM

## 2024-05-11 NOTE — Progress Notes (Addendum)
 Subjective:    Patient ID: Jocelyn Elliott, female    DOB: 1932/07/29, 89 y.o.   MRN: [id number]  Patient here for  Chief Complaint  Patient presents with   Medical Management of Chronic Issues    HPI Here for a scheduled follow up - follow up regarding CKD, afib, IDA, hypertension and diabetes. She is accompanied by her daughter Grayce. History obtained from both of them. Admitted 02/26/24 - 02/27/24 - watchman implant. Had f/u with cardiology 03/17/24 - no changes made. Had f/u with Dr Rennie 04/01/24 - hgb 12.8. continue ferrahem monthly. Continue B12 injections. Had been receiving dry needling and therapy. Helped.  Decreased need for tylenol . Breathing overall stable. No chest pain reported. Some swelling - legs. Has not been walking as much. Has noticed some coughing when drinking. Discussed ST - to evaluate swallowing. Wants to hold on further intervention. Blood pressure and blood sugar doing well. Painful lesion - inside mouth.    Past Medical History:  Diagnosis Date   Arthritis    AV malformation of gastrointestinal tract    Basal cell carcinoma 08/07/2023   Central upper forehead. Nodular pattern. Mohs 09/09/23   Blood in stool    CAD (coronary artery disease)    Carotid arterial disease (HCC)    Cataracts, bilateral    Chronic blood loss anemia    Chronic cystitis    CKD (chronic kidney disease), stage III (HCC)    Complication of anesthesia    reaction to propofol  - caused heart to pause   Depression    Diabetes mellitus (HCC)    GERD (gastroesophageal reflux disease)    Hyperlipidemia    Hypertension    IDA (iron deficiency anemia)    Stress incontinence    TIA (transient ischemic attack) 03/2021   No deficits   Wears dentures    full upper and lower   Past Surgical History:  Procedure Laterality Date   ABDOMINAL HYSTERECTOMY  11/19/1970   ovaries left in place   APPENDECTOMY  11/19/1990   BACK SURGERY  06/08/2010   L3, L4, L5   CARDIAC CATHETERIZATION   02/2001   CAROTID ARTERY ANGIOPLASTY  09/19/2002   CARPAL TUNNEL RELEASE  1991, 92   right then left    CATARACT EXTRACTION W/PHACO Left 02/13/2022   Procedure: CATARACT EXTRACTION PHACO AND INTRAOCULAR LENS PLACEMENT (IOC) LEFT DIABETIC 14.90 01:26.8;  Surgeon: [name], MD;  Location: [facility];  Service: Ophthalmology;  Laterality: Left;  Diabetic   CATARACT EXTRACTION W/PHACO Right 03/06/2022   Procedure: CATARACT EXTRACTION PHACO AND INTRAOCULAR LENS PLACEMENT (IOC) RIGHT DIABETIC 9.66 00:53.7;  Surgeon: [name], MD;  Location: [facility];  Service: Ophthalmology;  Laterality: Right;  Diabetic   CHOLECYSTECTOMY  11/20/1991   COLONOSCOPY WITH PROPOFOL  N/A 02/02/2019   Procedure: COLONOSCOPY WITH PROPOFOL ;  Surgeon: [name], MD;  Location: [facility];  Service: Endoscopy;  Laterality: N/A;   ESOPHAGOGASTRODUODENOSCOPY N/A 02/02/2019   Procedure: ESOPHAGOGASTRODUODENOSCOPY (EGD);  Surgeon: [name], MD;  Location: [facility] ENDOSCOPY;  Service: Endoscopy;  Laterality: N/A;   FOOT SURGERY  06/20/2007   Left   right carotid artery surgery  01/09/2013   Dr. Jama @ [facility]   TOTAL HIP ARTHROPLASTY  05/03/2011   left 12, right 11/07   Family History  Problem Relation Age of Onset   Hyperlipidemia Father    Heart disease Father        myocardial infarction   Hypertension Father  Parent   Arthritis Other        parent   Diabetes Other        nephew   Cervical cancer Sister    Rectal cancer Sister    Breast cancer Neg Hx    Social History   Socioeconomic History   Marital status: Widowed    Spouse name: Not on file   Number of children: 1   Years of education: 46   Highest education level: Not on file  Occupational History   Occupation: Retired  Tobacco Use   Smoking status: Former    Current packs/day: 0.00    Types: Cigarettes    Quit date: 11/19/1989    Years since quitting: 34.5   Smokeless tobacco: Never   Vaping Use   Vaping status: Never Used  Substance and Sexual Activity   Alcohol use: No    Alcohol/week: 0.0 standard drinks of alcohol   Drug use: No   Sexual activity: Not Currently  Other Topics Concern   Not on file  Social History Narrative   Regular exercise-no   Caffeine Use-yes   Social Drivers of Health   Financial Resource Strain: Low Risk  (04/02/2023)   Overall Financial Resource Strain (CARDIA)    Difficulty of Paying Living Expenses: Not hard at all  Food Insecurity: No Food Insecurity (02/28/2024)   Hunger Vital Sign    Worried About Running Out of Food in the Last Year: Never true    Ran Out of Food in the Last Year: Never true  Transportation Needs: No Transportation Needs (02/28/2024)   PRAPARE - Administrator, Civil Service (Medical): No    Lack of Transportation (Non-Medical): No  Physical Activity: Insufficiently Active (04/02/2023)   Exercise Vital Sign    Days of Exercise per Week: 7 days    Minutes of Exercise per Session: 20 min  Stress: No Stress Concern Present (04/02/2023)   [employer] of Occupational Health - Occupational Stress Questionnaire    Feeling of Stress : Not at all  Social Connections: Socially Integrated (04/02/2023)   Social Connection and Isolation Panel    Frequency of Communication with Friends and Family: More than three times a week    Frequency of Social Gatherings with Friends and Family: More than three times a week    Attends Religious Services: More than 4 times per year    Active Member of [employer] or Organizations: Yes    Attends Engineer, structural: More than 4 times per year    Marital Status: Married     Review of Systems  Constitutional:  Negative for appetite change and unexpected weight change.  HENT:  Negative for congestion and sinus pressure.   Respiratory:  Negative for cough, chest tightness and shortness of breath.   Cardiovascular:  Negative for chest pain and palpitations.        Some leg swelling reported.   Gastrointestinal:  Negative for abdominal pain, diarrhea, nausea and vomiting.  Genitourinary:  Negative for difficulty urinating and dysuria.  Musculoskeletal:  Negative for joint swelling and myalgias.  Skin:  Negative for color change and rash.  Neurological:  Negative for dizziness and headaches.  Psychiatric/Behavioral:  Negative for agitation and dysphoric mood.        Objective:     BP 118/70   Pulse 78   Temp 98 F (36.7 C)   Resp 16   Ht 4' 11 (1.499 m)   Wt 136 lb (61.7 kg)  SpO2 98%   BMI 27.47 kg/m  Wt Readings from Last 3 Encounters:  05/11/24 136 lb (61.7 kg)  04/01/24 136 lb 12.8 oz (62.1 kg)  02/04/24 134 lb 9.6 oz (61.1 kg)    Physical Exam Vitals reviewed.  Constitutional:      General: She is not in acute distress.    Appearance: Normal appearance.  HENT:     Head: Normocephalic and atraumatic.     Right Ear: External ear normal.     Left Ear: External ear normal.     Mouth/Throat:     Pharynx: No oropharyngeal exudate or posterior oropharyngeal erythema.   Eyes:     General: No scleral icterus.       Right eye: No discharge.        Left eye: No discharge.     Conjunctiva/sclera: Conjunctivae normal.   Neck:     Thyroid : No thyromegaly.   Cardiovascular:     Rate and Rhythm: Normal rate and regular rhythm.  Pulmonary:     Effort: No respiratory distress.     Breath sounds: Normal breath sounds. No wheezing.  Abdominal:     General: Bowel sounds are normal.     Palpations: Abdomen is soft.     Tenderness: There is no abdominal tenderness.   Musculoskeletal:        General: No swelling or tenderness.     Cervical back: Neck supple. No tenderness.  Lymphadenopathy:     Cervical: No cervical adenopathy.   Skin:    Findings: No erythema or rash.   Neurological:     Mental Status: She is alert.   Psychiatric:        Mood and Affect: Mood normal.        Behavior: Behavior normal.          Outpatient Encounter Medications as of 05/11/2024  Medication Sig   apixaban  (ELIQUIS ) 2.5 MG TABS tablet Take 1 tablet (2.5 mg total) by mouth 2 (two) times daily.   Biotin  5000 MCG CAPS Take 1 capsule by mouth daily.   cetirizine (ZYRTEC) 10 MG tablet Take 10 mg by mouth daily.   Cholecalciferol (VITAMIN D -3) 25 MCG (1000 UT) CAPS Take 1,000 Units by mouth daily.   docusate sodium  (COLACE) 100 MG capsule Take 100 mg by mouth daily as needed for mild constipation.   ferrous sulfate  325 (65 FE) MG EC tablet Take 325 mg by mouth every other day. Every other day   glucose blood (ONETOUCH ULTRA) test strip USE AS INSTRUCTED TO CHECK BLOOD SUGARS ONCE DAILY. DX E11.9   hydrocortisone  2.5 % cream apply topically 2 times daily as needed for itch at right lower leg   ketoconazole  (NIZORAL ) 2 % shampoo MASSAGE INTO SCALP AND LET SIT SEVERAL MINUTES BEFORE RINSING. USE 2-3 TIMES A WEEK.   Lancet Device MISC Use as directed to check blood sugars   Lancets (ONETOUCH DELICA PLUS LANCET30G) MISC USE AS INSTRUCTED TO CHECK BLOOD SUGARS ONCE DAILY. DX E 11.9   metoprolol  succinate (TOPROL -XL) 25 MG 24 hr tablet Take 12.5 mg by mouth daily.   nystatin  (MYCOSTATIN ) 100000 UNIT/ML suspension 5cc's swish and spit tid.   Omega-3 Fatty Acids (FISH OIL) 1200 MG CAPS Take 1 capsule by mouth daily.   Probiotic, Lactobacillus, CAPS Take 1 capsule by mouth daily.   rosuvastatin  (CRESTOR ) 40 MG tablet TAKE 1 TABLET BY MOUTH EVERY DAY   traZODone  (DESYREL ) 50 MG tablet TAKE 1/2 TO 1 TABLET BY MOUTH AT  BEDTIME AS NEEDED FOR SLEEP   [DISCONTINUED] midodrine  (PROAMATINE ) 5 MG tablet Take 1 tablet (5 mg total) by mouth 2 (two) times daily with a meal. (Patient not taking: Reported on 05/11/2024)   No facility-administered encounter medications on file as of 05/11/2024.     Lab Results  Component Value Date   WBC 7.1 04/01/2024   HGB 12.8 04/01/2024   HCT 39.0 04/01/2024   PLT 182 04/01/2024   GLUCOSE 101 (H)  05/11/2024   CHOL 139 05/11/2024   TRIG 117.0 05/11/2024   HDL 65.00 05/11/2024   LDLDIRECT 51.0 04/14/2019   LDLCALC 51 05/11/2024   ALT 13 05/11/2024   AST 20 05/11/2024   NA 142 05/11/2024   K 3.8 05/11/2024   CL 105 05/11/2024   CREATININE 1.21 (H) 05/11/2024   BUN 19 05/11/2024   CO2 27 05/11/2024   TSH 1.29 05/11/2024   INR 1.2 02/28/2023   HGBA1C 6.1 05/11/2024   MICROALBUR 12.7 (H) 07/16/2022    ECHOCARDIOGRAM COMPLETE Result Date: 10/31/2023    ECHOCARDIOGRAM REPORT   Patient Name:   MARLINE MORACE Date of Exam: 10/30/2023 Medical Rec #:  [id number]        Height:       59.0 in Accession #:    [id number]       Weight:       138.0 lb Date of Birth:  06/27/1932        BSA:          1.575 m Patient Age:    89 years         BP:           136/68 mmHg Patient Gender: F                HR:           89 bpm. Exam Location:  [facility] Procedure: 2D Echo, Cardiac Doppler and Color Doppler Indications:     I63.9 Stroke  History:         Patient has no prior history of Echocardiogram examinations.                  CAD, TIA; Risk Factors:Hypertension, Diabetes and Dyslipidemia.  Sonographer:     Carl Coma RDCS Referring Phys:  [id number] ASHISH ARORA Diagnosing Phys: Dwayne D Callwood MD IMPRESSIONS  1. Left ventricular ejection fraction, by estimation, is 55 to 60%. The left ventricle has normal function. The left ventricle has no regional wall motion abnormalities. Left ventricular diastolic parameters are consistent with Grade III diastolic dysfunction (restrictive).  2. Right ventricular systolic function is normal. The right ventricular size is normal.  3. Left atrial size was moderately dilated.  4. Right atrial size was mild to moderately dilated.  5. The mitral valve is abnormal. Moderate mitral valve regurgitation. Moderate to severe mitral annular calcification.  6. The aortic valve is calcified. Aortic valve regurgitation is mild. Mild aortic valve stenosis. FINDINGS  Left  Ventricle: Left ventricular ejection fraction, by estimation, is 55 to 60%. The left ventricle has normal function. The left ventricle has no regional wall motion abnormalities. The left ventricular internal cavity size was normal in size. There is  no left ventricular hypertrophy. Left ventricular diastolic parameters are consistent with Grade III diastolic dysfunction (restrictive). Right Ventricle: The right ventricular size is normal. No increase in right ventricular wall thickness. Right ventricular systolic function is normal. Left Atrium: Left atrial size was moderately dilated. Right Atrium: Right atrial size  was mild to moderately dilated. Pericardium: There is no evidence of pericardial effusion. The mitral valve is abnormal. There is moderate thickening of the mitral valve leaflet(s). There is severe calcification of the mitral valve leaflet(s). Mildly decreased mobility of the mitral valve leaflets. Moderate to severe mitral annular calcification. Moderate mitral valve regurgitation. Tricuspid Valve: The tricuspid valve is grossly normal. Tricuspid valve regurgitation is mild. The aortic valve is calcified. Aortic valve regurgitation is mild. Mild aortic stenosis is present. Pulmonic Valve: The pulmonic valve was normal in structure. Pulmonic valve regurgitation is not visualized. Aorta: The ascending aorta was not well visualized. IAS/Shunts: No atrial level shunt detected by color flow Doppler.  LEFT VENTRICLE PLAX 2D LVIDd:         4.70 cm LVIDs:         3.30 cm LV PW:         0.90 cm LV IVS:        0.70 cm LVOT diam:     1.60 cm LV SV:         57 LV SV Index:   36 LVOT Area:     2.01 cm  RIGHT VENTRICLE            IVC RV Basal diam:  3.20 cm    IVC diam: 1.30 cm RV S prime:     9.99 cm/s TAPSE (M-mode): 1.6 cm LEFT ATRIUM             Index        RIGHT ATRIUM          Index LA diam:        5.00 cm 3.17 cm/m   RA Area:     9.03 cm LA Vol (A2C):   41.2 ml 26.15 ml/m  RA Volume:   19.70 ml 12.51  ml/m LA Vol (A4C):   32.6 ml 20.69 ml/m LA Biplane Vol: 38.6 ml 24.50 ml/m  AORTIC VALVE AV Area (Vmax):    1.44 cm AV Area (Vmean):   1.41 cm AV Area (VTI):     1.43 cm AV Vmax:           215.60 cm/s AV Vmean:          151.000 cm/s AV VTI:            0.399 m AV Peak Grad:      18.6 mmHg AV Mean Grad:      10.6 mmHg LVOT Vmax:         154.67 cm/s LVOT Vmean:        105.667 cm/s LVOT VTI:          0.284 m LVOT/AV VTI ratio: 0.71  AORTA Ao Root diam: 3.10 cm Ao Asc diam:  3.50 cm MITRAL VALVE                TRICUSPID VALVE MV Area (PHT): 4.74 cm     TR Peak grad:   46.2 mmHg MV Area VTI:   1.51 cm     TR Vmax:        340.00 cm/s MV Peak grad:  16.5 mmHg MV Mean grad:  7.0 mmHg     SHUNTS MV Vmax:       2.03 m/s     Systemic VTI:  0.28 m MV Vmean:      120.5 cm/s   Systemic Diam: 1.60 cm MV Decel Time: 160 msec MV E velocity: 168.00 cm/s MV A velocity: 63.40 cm/s MV E/A ratio:  2.65 Cara JONETTA Lovelace MD Electronically signed by Cara JONETTA Lovelace MD Signature Date/Time: 10/31/2023/4:09:35 PM    Final    CT ANGIO HEAD NECK W WO CM Result Date: 10/30/2023 CLINICAL DATA:  Neuro deficit, acute, stroke suspected EXAM: CT ANGIOGRAPHY HEAD AND NECK WITH AND WITHOUT CONTRAST TECHNIQUE: Multidetector CT imaging of the head and neck was performed using the standard protocol during bolus administration of intravenous contrast. Multiplanar CT image reconstructions and MIPs were obtained to evaluate the vascular anatomy. Carotid stenosis measurements (when applicable) are obtained utilizing NASCET criteria, using the distal internal carotid diameter as the denominator. RADIATION DOSE REDUCTION: This exam was performed according to the departmental dose-optimization program which includes automated exposure control, adjustment of the mA and/or kV according to patient size and/or use of iterative reconstruction technique. CONTRAST:  75mL OMNIPAQUE  IOHEXOL  350 MG/ML SOLN COMPARISON:  None Available. FINDINGS: CT HEAD  FINDINGS Brain: No hemorrhage. No hydrocephalus. No extra-axial fluid collection. There is a chronic left superior cerebellar infarct. No CT evidence of an acute cortical infarct. Mass effect. No mass lesion. There is a background of moderate chronic microvascular ischemic change. Vascular: No hyperdense vessel or unexpected calcification. Skull: Normal. Negative for fracture or focal lesion. Sinuses/Orbits: No middle ear or mastoid effusion. Paranasal sinuses are clear. Bilateral lens replacement. Orbits are otherwise unremarkable. Other: None. Review of the MIP images confirms the above findings CTA NECK FINDINGS Aortic arch: Standard branching. Imaged portion shows no evidence of aneurysm or dissection. Atherosclerotic calcifications of the aortic arch. Moderate narrowing of the origin of the left subclavian artery likely the brachiocephalic artery. Right carotid system: No evidence of dissection, stenosis (50% or greater), or occlusion. Mild narrowing in the cavernous segment of the right ICA Left carotid system: No evidence of dissection or occlusion. Approximately 60% stenosis of the origin of the left ICA secondary to soft atherosclerotic plaque. Somewhat beaded and irregular appearance of the distal left common carotid artery, as can be seen in the setting of FMD. Vertebral arteries: Right dominant. Moderate to severe stenosis of the origin of the left vertebral artery secondary to calcified atherosclerotic plaque no evidence of dissection, stenosis (50% or greater), or occlusion. Skeleton: Grade 1 anterolisthesis of C3 on C4 and C4 on C5 Other neck: Negative. Upper chest: Negative. Review of the MIP images confirms the above findings CTA HEAD FINDINGS Anterior circulation: No significant stenosis, proximal occlusion, aneurysm, or vascular malformation. Posterior circulation: No significant stenosis, proximal occlusion, aneurysm, or vascular malformation. Moderate narrowing in the P2 segment of the left PCA  Venous sinuses: As permitted by contrast timing, patent. Anatomic variants: No Review of the MIP images confirms the above findings IMPRESSION: 1. No hemorrhage or CT evidence of an acute cortical infarct. 2. No intracranial large vessel occlusion. 3. Approximately 60% stenosis of the origin of the left ICA secondary to soft atherosclerotic plaque. 4. Moderate to severe stenosis of the origin of the left vertebral artery secondary to calcified atherosclerotic plaque. 5. Somewhat beaded and irregular appearance of the distal left common carotid artery and proximal left ICA, as can be seen in the setting of FMD. Aortic Atherosclerosis (ICD10-I70.0). Electronically Signed   By: Lyndall Gore M.D.   On: 10/30/2023 11:18   MR BRAIN WO CONTRAST Result Date: 10/30/2023 CLINICAL DATA:  Onset of right-sided weakness and confusion with cysts speech difficulty today. EXAM: MRI HEAD WITHOUT CONTRAST TECHNIQUE: Multiplanar, multiecho pulse sequences of the brain and surrounding structures were obtained without intravenous contrast. COMPARISON:  Head CT  from yesterday. FINDINGS: Brain: Band of restricted diffusion at the upper and lateral left thalamus extending laterally to the periatrial white matter and medial temporal cortex. There is a background of chronic small vessel ischemic gliosis in the deep cerebral white matter. Chronic left cerebellar infarction. No evidence of hemorrhage, hydrocephalus, mass, or collection. Vascular: Grossly preserved major flow voids by axial T2 weighted imaging. Skull and upper cervical spine: No gross marrow lesion Sinuses/Orbits: Negative Other: Progressive severe motion artifact with numerous nondiagnostic sequences. The scan was truncated before MRA and coronal T2 weighted imaging. IMPRESSION: Very motion degraded and truncated brain MRI but still positive for acute infarct in the left thalamus capsular, periatrial, and medial temporal areas (thalamoperforator versus anterior choroidal  infarction). Electronically Signed   By: Dorn Roulette M.D.   On: 10/30/2023 04:39       Assessment & Plan:  Essential (primary) hypertension  Hypercholesteremia Assessment & Plan: Continue crestor .  Low cholesterol diet and exercise.  Follow lipid panel.   Orders: -     Hepatic function panel -     Lipid panel -     TSH  Paroxysmal atrial fibrillation [facility]) Assessment & Plan: Eliquis . 02/27/24 - ([facility]) - watchman implant. Stable.    Type 2 diabetes mellitus with other circulatory complication, without long-term current use of insulin  (HCC) Assessment & Plan: Follow met b and A1c.  Lab Results  Component Value Date   HGBA1C 6.1 05/11/2024     Orders: -     Basic metabolic panel with GFR -     Hemoglobin A1c  Abnormal chest CT Assessment & Plan: Discussed recent CT scan results. Discussed findings of scattered mediastinal nodes - felt likely to be reactive. Recommended f/u CT in 3 months. Also discussed the finding of dilatation of the pancreas. Recommended dedicated pancreatic MRI protocol. Have discussed recommendations and further scanning. She declined.  Will notify me if changes her mind. See last note. Eating. No nausea or vomiting. No abdominal pain.    Anemia, unspecified type Assessment & Plan: Followed by hematology.  History of AVM.  Also with CKD.  Receiving IV iron.  Followed by Dr Rennie.  On eliquis . Had f/u with Dr Rennie 04/01/24 - hgb 12.8.     Aortic atherosclerosis (HCC) Assessment & Plan: Continue crestor .    Coronary artery disease involving native coronary artery of native heart without angina pectoris Assessment & Plan: Remains on crestor .  Continue risk factor modifications. Stable.    Carotid artery disease, unspecified laterality, unspecified type (HCC) Assessment & Plan:  Continue antiplatelet therapy as prescribed Continue management of CAD, HTN and Hyperlipidemia Evaluated [facility] 08/26/23 -ultrasound shows RICA 1-39% and LICA  40-59% stenosis bilaterally s/p left CEA.  Recommended f/u in 12 months.  Recent CTA (during admission) as outlined. Continue crestor .    History of CVA (cerebrovascular accident) Assessment & Plan: Recent admission as outlined.  W/up as outlined. On eliquis . Continue crestor . Residual right visual field deficit.  Evaluated by ophthalmology. Continue risk factor modification. Continue crestor . Continue eliquis .    Primary hypertension Assessment & Plan: Continues on metoprolol . Follow pressures. Follow metabolic panel.    Stage 3b chronic kidney disease (HCC) Assessment & Plan: Continue to avoid antiinflammatories.  Stay hydrated.  Follow metabolic panel. Off ace inhibitor due to low blood pressure. GFR 39. Follow.    Thrombocytopenia (HCC) Assessment & Plan: Follow cbc.  Being followed by hematology.    Mouth lesion Assessment & Plan: Nystatin  suspension - as directed.  Other orders -     Nystatin ; 5cc's swish and spit tid.  Dispense: 60 mL; Refill: 0     Allena Hamilton, MD

## 2024-05-12 LAB — BASIC METABOLIC PANEL WITH GFR
BUN: 19 mg/dL (ref 6–23)
CO2: 27 meq/L (ref 19–32)
Calcium: 10.1 mg/dL (ref 8.4–10.5)
Chloride: 105 meq/L (ref 96–112)
Creatinine, Ser: 1.21 mg/dL — ABNORMAL HIGH (ref 0.40–1.20)
GFR: 39.62 mL/min — ABNORMAL LOW (ref >60.00)
Glucose, Bld: 101 mg/dL — ABNORMAL HIGH (ref 70–99)
Potassium: 3.8 meq/L (ref 3.5–5.1)
Sodium: 142 meq/L (ref 135–145)

## 2024-05-12 LAB — LIPID PANEL
Cholesterol: 139 mg/dL (ref 0–200)
HDL: 65 mg/dL (ref >39.00)
LDL Cholesterol: 51 mg/dL (ref 0–99)
NonHDL: 74.27
Total CHOL/HDL Ratio: 2
Triglycerides: 117 mg/dL (ref 0.0–149.0)
VLDL: 23.4 mg/dL (ref 0.0–40.0)

## 2024-05-12 LAB — HEPATIC FUNCTION PANEL
ALT: 13 U/L (ref 0–35)
AST: 20 U/L (ref 0–37)
Albumin: 4.4 g/dL (ref 3.5–5.2)
Alkaline Phosphatase: 42 U/L (ref 39–117)
Bilirubin, Direct: 0.2 mg/dL (ref 0.0–0.3)
Total Bilirubin: 0.7 mg/dL (ref 0.2–1.2)
Total Protein: 7.2 g/dL (ref 6.0–8.3)

## 2024-05-12 LAB — HEMOGLOBIN A1C: Hgb A1c MFr Bld: 6.1 % (ref 4.6–6.5)

## 2024-05-12 LAB — TSH: TSH: 1.29 u[IU]/mL (ref 0.35–5.50)

## 2024-05-13 ENCOUNTER — Ambulatory Visit: Payer: Self-pay | Admitting: Internal Medicine

## 2024-05-15 NOTE — Telephone Encounter (Signed)
 Copied from CRM [id number]. Topic: Clinical - Lab/Test Results >> May 15, 2024  4:17 PM Rea C wrote: Reason for CRM: Patients daughter returned call and relayed lab results. No additional questions.

## 2024-05-16 ENCOUNTER — Encounter: Payer: Self-pay | Admitting: Internal Medicine

## 2024-05-16 DIAGNOSIS — K1370 Unspecified lesions of oral mucosa: Secondary | ICD-10-CM | POA: Insufficient documentation

## 2024-05-16 MED ORDER — NYSTATIN 100000 UNIT/ML MT SUSP: OROMUCOSAL | 0 refills | Status: DC

## 2024-05-16 NOTE — Assessment & Plan Note (Signed)
Follow cbc.  Being followed by hematology.

## 2024-05-16 NOTE — Assessment & Plan Note (Signed)
 Eliquis . 02/27/24 - ([facility]) - watchman implant. Stable.

## 2024-05-16 NOTE — Assessment & Plan Note (Signed)
 Follow met b and A1c.  Lab Results  Component Value Date   HGBA1C 6.1 05/11/2024

## 2024-05-16 NOTE — Assessment & Plan Note (Signed)
 Nystatin  suspension - as directed.

## 2024-05-16 NOTE — Assessment & Plan Note (Signed)
 Continues on metoprolol . Follow pressures. Follow metabolic panel.

## 2024-05-16 NOTE — Addendum Note (Signed)
 Addended by: GLENDIA ALLENA RAMAN on: 05/16/2024 05:26 PM   Modules accepted: Orders

## 2024-05-16 NOTE — Assessment & Plan Note (Signed)
Continue crestor.  Low cholesterol diet and exercise.  Follow lipid panel.  

## 2024-05-16 NOTE — Assessment & Plan Note (Signed)
 Remains on crestor .  Continue risk factor modifications. Stable.

## 2024-05-16 NOTE — Assessment & Plan Note (Signed)
 Continue to avoid antiinflammatories.  Stay hydrated.  Follow metabolic panel. Off ace inhibitor due to low blood pressure. GFR 39. Follow.

## 2024-05-16 NOTE — Assessment & Plan Note (Signed)
 Continue crestor

## 2024-05-16 NOTE — Assessment & Plan Note (Signed)
 Recent admission as outlined.  W/up as outlined. On eliquis . Continue crestor . Residual right visual field deficit.  Evaluated by ophthalmology. Continue risk factor modification. Continue crestor . Continue eliquis .

## 2024-05-16 NOTE — Assessment & Plan Note (Signed)
 Continue antiplatelet therapy as prescribed Continue management of CAD, HTN and Hyperlipidemia Evaluated [facility] 08/26/23 -ultrasound shows RICA 1-39% and LICA 40-59% stenosis bilaterally s/p left CEA.  Recommended f/u in 12 months.  Recent CTA (during admission) as outlined. Continue crestor .

## 2024-05-16 NOTE — Assessment & Plan Note (Signed)
 Discussed recent CT scan results. Discussed findings of scattered mediastinal nodes - felt likely to be reactive. Recommended f/u CT in 3 months. Also discussed the finding of dilatation of the pancreas. Recommended dedicated pancreatic MRI protocol. Have discussed recommendations and further scanning. She declined.  Will notify me if changes her mind. See last note. Eating. No nausea or vomiting. No abdominal pain.

## 2024-05-16 NOTE — Assessment & Plan Note (Signed)
 Followed by hematology.  History of AVM.  Also with CKD.  Receiving IV iron.  Followed by Dr Rennie.  On eliquis . Had f/u with Dr Rennie 04/01/24 - hgb 12.8.

## 2024-06-01 ENCOUNTER — Encounter: Payer: Self-pay | Admitting: Internal Medicine

## 2024-06-01 ENCOUNTER — Inpatient Hospital Stay

## 2024-06-01 ENCOUNTER — Inpatient Hospital Stay (HOSPITAL_BASED_OUTPATIENT_CLINIC_OR_DEPARTMENT_OTHER): Admitting: Internal Medicine

## 2024-06-01 ENCOUNTER — Inpatient Hospital Stay: Attending: Internal Medicine

## 2024-06-01 ENCOUNTER — Telehealth: Payer: Self-pay | Admitting: Internal Medicine

## 2024-06-01 DIAGNOSIS — I4891 Unspecified atrial fibrillation: Secondary | ICD-10-CM | POA: Diagnosis not present

## 2024-06-01 DIAGNOSIS — E1159 Type 2 diabetes mellitus with other circulatory complications: Secondary | ICD-10-CM

## 2024-06-01 DIAGNOSIS — D631 Anemia in chronic kidney disease: Secondary | ICD-10-CM | POA: Insufficient documentation

## 2024-06-01 DIAGNOSIS — N183 Chronic kidney disease, stage 3 unspecified: Secondary | ICD-10-CM

## 2024-06-01 DIAGNOSIS — Z7901 Long term (current) use of anticoagulants: Secondary | ICD-10-CM | POA: Diagnosis not present

## 2024-06-01 DIAGNOSIS — Z8673 Personal history of transient ischemic attack (TIA), and cerebral infarction without residual deficits: Secondary | ICD-10-CM | POA: Insufficient documentation

## 2024-06-01 DIAGNOSIS — E538 Deficiency of other specified B group vitamins: Secondary | ICD-10-CM | POA: Diagnosis present

## 2024-06-01 LAB — CBC WITH DIFFERENTIAL (CANCER CENTER ONLY)
Abs Immature Granulocytes: 0.03 K/uL (ref 0.00–0.07)
Basophils Absolute: 0 K/uL (ref 0.0–0.1)
Basophils Relative: 1 %
Eosinophils Absolute: 0.1 K/uL (ref 0.0–0.5)
Eosinophils Relative: 1 %
HCT: 39.6 % (ref 36.0–46.0)
Hemoglobin: 13.3 g/dL (ref 12.0–15.0)
Immature Granulocytes: 0 %
Lymphocytes Relative: 18 %
Lymphs Abs: 1.2 K/uL (ref 0.7–4.0)
MCH: 34 pg (ref 26.0–34.0)
MCHC: 33.6 g/dL (ref 30.0–36.0)
MCV: 101.3 fL — ABNORMAL HIGH (ref 80.0–100.0)
Monocytes Absolute: 0.4 K/uL (ref 0.1–1.0)
Monocytes Relative: 6 %
Neutro Abs: 5.1 K/uL (ref 1.7–7.7)
Neutrophils Relative %: 74 %
Platelet Count: 166 K/uL (ref 150–400)
RBC: 3.91 MIL/uL (ref 3.87–5.11)
RDW: 12.7 % (ref 11.5–15.5)
WBC Count: 6.8 K/uL (ref 4.0–10.5)
nRBC: 0 % (ref 0.0–0.2)

## 2024-06-01 LAB — FERRITIN: Ferritin: 69 ng/mL (ref 11–307)

## 2024-06-01 LAB — BASIC METABOLIC PANEL - CANCER CENTER ONLY
Anion gap: 10 (ref 5–15)
BUN: 18 mg/dL (ref 8–23)
CO2: 23 mmol/L (ref 22–32)
Calcium: 9.1 mg/dL (ref 8.9–10.3)
Chloride: 108 mmol/L (ref 98–111)
Creatinine: 1.23 mg/dL — ABNORMAL HIGH (ref 0.44–1.00)
GFR, Estimated: 42 mL/min — ABNORMAL LOW (ref >60)
Glucose, Bld: 142 mg/dL — ABNORMAL HIGH (ref 70–99)
Potassium: 3.8 mmol/L (ref 3.5–5.1)
Sodium: 141 mmol/L (ref 135–145)

## 2024-06-01 LAB — VITAMIN B12: Vitamin B-12: 465 pg/mL (ref 180–914)

## 2024-06-01 LAB — IRON AND TIBC
Iron: 76 ug/dL (ref 28–170)
Saturation Ratios: 24 % (ref 10.4–31.8)
TIBC: 316 ug/dL (ref 250–450)
UIBC: 240 ug/dL

## 2024-06-01 NOTE — Assessment & Plan Note (Addendum)
#  Severe anemia hemoglobin-nadir 6 [summer 2020]. 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 JUNE 2023- NEGATIVE for hemolysis [haptoglobin-WNL]  # Today hemoglobin is  13.8- continue to HOLD Retacrit  today. ALSO HOLD Ferrahem- stable.   # B12 def- continue B12 injections. HOLD injection today. Stable.    # CKD stage III- Recommend continued p.o. intake. Stable.   # A.fib/Stroke [Dec 2024] OFF plavix  [Dr.Shah/NP] Dr.Thomas s/p watchman device-[ APRIL 2025]- on eliquis  on 2.5 mg BID- awaiting to come off eliquis  soon.    Monthly B12; Ferra/retacrit  q m  # DISPOSITION:  # HOLD Ferrahem today;   # HOLD RETACRIT ;   # HOLD B 12 injection # in 2  month  MD-  labs- -cbc;bmp;iron studies;ferriitn; b12 - - possible Ferrahem or possible retacrit ; B12 injection- Dr.B

## 2024-06-01 NOTE — Progress Notes (Signed)
 Patient presents to be doing well today. She had an echo done on the 9th and then had fall the next day. Daughter Grayce thinks she fell due to still being under sedation from echo procedure.  The fall did not cause patient to go to ED, just a couple of internal bruises and some scraping on the right knee.  Other than that, no new or acute concerns at this time.

## 2024-06-01 NOTE — Telephone Encounter (Signed)
 Pt stopped today to drop off a paper outlining change of coverage for her One Touch meter/strips. They stated they will be needing a new prescription as well. [facility]

## 2024-06-01 NOTE — Progress Notes (Signed)
 [facility] OFFICE PROGRESS NOTE  Patient Care Team: Glendia Shad, MD as PCP - General (Internal Medicine) Glendia Shad, MD (Internal Medicine) Ammon Blunt, MD (Internal Medicine) Jackquline Sawyer, MD (Specialist) Schnier, Cordella MATSU, MD (Vascular Surgery) Ammon Blunt, MD (Internal Medicine) Jackquline Sawyer, MD (Specialist) Schnier, Cordella MATSU, MD (Vascular Surgery) Rennie Cindy SAUNDERS, MD as Consulting Physician (Oncology)   SUMMARY OF HEMATOLOGIC HISTORY:  # IRON DEFICIENCY ANEMIA- recurrent [? GI blood loss; Dr.Elliot; AVM-capsule]; IV Ferrahem q 78m; last colo- 2012/march; EGD- [id number].  July 2020 bone marrow biopsy-mild dysplastic changes; 40% hypercellularity; cytogenetics-normal.-Retacrit .MARCH 2023- [facility] One Hem- TP53 subclonal. B12 def- FEB 2023- Start B12 injections Monthly; MAY 3rd, 2023- Start Retacrit  20K [place].   #Colonoscopy-April 2020 aborted because of cardiac pause.  # CKD stage III-IV; CAD-Dr. Ammon;  March 30th, 2022-TIA/slurred spleech [Dr.Shah]; dec 2024- affecting right eye and memory.  INTERVAL HISTORY: In a wheelchair.  With her daughter.   89 year old female patient with above history of recurrent iron deficiency anemia unclear etiology/?-related to CKD/ A.fib- s/p watchman device currently eliquis  2.5 mg BID;stroke affecting right eye and memory -on Feraheme /Retacrit  is here for follow-up.  Patient presents to be doing well today. She had an echo done on the 9th and then had fall the next day. Daughter Grayce thinks she fell due to still being under sedation from echo procedure.   The fall did not cause patient to go to ED, just a couple of internal bruises and some scraping on the right knee.   Other than that, no new or acute concerns at this time.   Otherwise no blood in stools or black-colored stools.  Review of Systems  Constitutional:  Positive for malaise/fatigue. Negative for chills, diaphoresis, fever  and weight loss.  HENT:  Negative for nosebleeds and sore throat.   Eyes:  Negative for double vision.  Respiratory:  Negative for cough, hemoptysis, sputum production and wheezing.   Cardiovascular:  Negative for palpitations, orthopnea and leg swelling.  Gastrointestinal:  Negative for abdominal pain, blood in stool, constipation, diarrhea, heartburn, melena, nausea and vomiting.  Genitourinary:  Negative for dysuria, frequency and urgency.  Musculoskeletal:  Negative for back pain and joint pain.  Skin: Negative.  Negative for itching and rash.  Neurological:  Negative for dizziness, tingling, focal weakness, weakness and headaches.  Endo/Heme/Allergies:  Does not bruise/bleed easily.  Psychiatric/Behavioral:  Negative for depression. The patient is not nervous/anxious and does not have insomnia.      PAST MEDICAL HISTORY :  Past Medical History:  Diagnosis Date   Arthritis    AV malformation of gastrointestinal tract    Basal cell carcinoma 08/07/2023   Central upper forehead. Nodular pattern. Mohs 09/09/23   Blood in stool    CAD (coronary artery disease)    Carotid arterial disease (HCC)    Cataracts, bilateral    Chronic blood loss anemia    Chronic cystitis    CKD (chronic kidney disease), stage III (HCC)    Complication of anesthesia    reaction to propofol  - caused heart to pause   Depression    Diabetes mellitus (HCC)    GERD (gastroesophageal reflux disease)    Hyperlipidemia    Hypertension    IDA (iron deficiency anemia)    Stress incontinence    TIA (transient ischemic attack) 03/2021   No deficits   Wears dentures    full upper and lower    PAST SURGICAL HISTORY :   Past Surgical History:  Procedure  Laterality Date   ABDOMINAL HYSTERECTOMY  11/19/1970   ovaries left in place   APPENDECTOMY  11/19/1990   BACK SURGERY  06/08/2010   L3, L4, L5   CARDIAC CATHETERIZATION  02/2001   CAROTID ARTERY ANGIOPLASTY  09/19/2002   CARPAL TUNNEL RELEASE  1991,  92   right then left    CATARACT EXTRACTION W/PHACO Left 02/13/2022   Procedure: CATARACT EXTRACTION PHACO AND INTRAOCULAR LENS PLACEMENT (IOC) LEFT DIABETIC 14.90 01:26.8;  Surgeon: [name], MD;  Location: [facility];  Service: Ophthalmology;  Laterality: Left;  Diabetic   CATARACT EXTRACTION W/PHACO Right 03/06/2022   Procedure: CATARACT EXTRACTION PHACO AND INTRAOCULAR LENS PLACEMENT (IOC) RIGHT DIABETIC 9.66 00:53.7;  Surgeon: [name], MD;  Location: [facility];  Service: Ophthalmology;  Laterality: Right;  Diabetic   CHOLECYSTECTOMY  11/20/1991   COLONOSCOPY WITH PROPOFOL  N/A 02/02/2019   Procedure: COLONOSCOPY WITH PROPOFOL ;  Surgeon: [name], MD;  Location: [facility] ENDOSCOPY;  Service: Endoscopy;  Laterality: N/A;   ESOPHAGOGASTRODUODENOSCOPY N/A 02/02/2019   Procedure: ESOPHAGOGASTRODUODENOSCOPY (EGD);  Surgeon: [name], MD;  Location: [facility] ENDOSCOPY;  Service: Endoscopy;  Laterality: N/A;   FOOT SURGERY  06/20/2007   Left   right carotid artery surgery  01/09/2013   Dr. Jama @ [facility]   TOTAL HIP ARTHROPLASTY  05/03/2011   left 12, right 11/07    FAMILY HISTORY :   Family History  Problem Relation Age of Onset   Hyperlipidemia Father    Heart disease Father        myocardial infarction   Hypertension Father        Parent   Arthritis Other        parent   Diabetes Other        nephew   Cervical cancer Sister    Rectal cancer Sister    Breast cancer Neg Hx     SOCIAL HISTORY:   Social History   Tobacco Use   Smoking status: Former    Current packs/day: 0.00    Types: Cigarettes    Quit date: 11/19/1989    Years since quitting: 34.5   Smokeless tobacco: Never  Vaping Use   Vaping status: Never Used  Substance Use Topics   Alcohol use: No    Alcohol/week: 0.0 standard drinks of alcohol   Drug use: No    ALLERGIES:  is allergic to propofol , ciprofloxacin, levaquin [levofloxacin], and tequin  [gatifloxacin].  MEDICATIONS:  Current Outpatient Medications  Medication Sig Dispense Refill   apixaban  (ELIQUIS ) 2.5 MG TABS tablet Take 1 tablet (2.5 mg total) by mouth 2 (two) times daily. 60 tablet 0   Biotin  5000 MCG CAPS Take 1 capsule by mouth daily.     cetirizine (ZYRTEC) 10 MG tablet Take 10 mg by mouth daily.     Cholecalciferol (VITAMIN D -3) 25 MCG (1000 UT) CAPS Take 1,000 Units by mouth daily.     docusate sodium  (COLACE) 100 MG capsule Take 100 mg by mouth daily as needed for mild constipation.     ferrous sulfate  325 (65 FE) MG EC tablet Take 325 mg by mouth every other day. Every other day     glucose blood (ONETOUCH ULTRA) test strip USE AS INSTRUCTED TO CHECK BLOOD SUGARS ONCE DAILY. DX E11.9 100 strip 12   hydrocortisone  2.5 % cream apply topically 2 times daily as needed for itch at right lower leg 30 g 5   ketoconazole  (NIZORAL ) 2 % shampoo MASSAGE INTO SCALP AND LET  SIT SEVERAL MINUTES BEFORE RINSING. USE 2-3 TIMES A WEEK. 120 mL 2   Lancet Device MISC Use as directed to check blood sugars 1 each 0   Lancets (ONETOUCH DELICA PLUS LANCET30G) MISC USE AS INSTRUCTED TO CHECK BLOOD SUGARS ONCE DAILY. DX E 11.9 100 each 12   metoprolol  succinate (TOPROL -XL) 25 MG 24 hr tablet Take 12.5 mg by mouth daily.     nystatin  (MYCOSTATIN ) 100000 UNIT/ML suspension 5cc's swish and spit tid. 60 mL 0   Omega-3 Fatty Acids (FISH OIL) 1200 MG CAPS Take 1 capsule by mouth daily.     Probiotic, Lactobacillus, CAPS Take 1 capsule by mouth daily.     rosuvastatin  (CRESTOR ) 40 MG tablet TAKE 1 TABLET BY MOUTH EVERY DAY 90 tablet 1   traZODone  (DESYREL ) 50 MG tablet TAKE 1/2 TO 1 TABLET BY MOUTH AT BEDTIME AS NEEDED FOR SLEEP 90 tablet 1   No current facility-administered medications for this visit.    PHYSICAL EXAMINATION:   BP 130/86 (BP Location: Left Arm, Patient Position: Sitting)   Pulse 73   Temp (!) 97.3 F (36.3 C) (Tympanic)   Resp 19   Ht 4' 11 (1.499 m)   Wt 137 lb 8 oz  (62.4 kg)   SpO2 96%   BMI 27.77 kg/m   Filed Weights   06/01/24 1311  Weight: 137 lb 8 oz (62.4 kg)    Physical Exam Constitutional:      Comments: Frail-appearing Caucasian female patient.  She is in a wheelchair.  HENT:     Head: Normocephalic and atraumatic.     Mouth/Throat:     Pharynx: No oropharyngeal exudate.  Eyes:     Pupils: Pupils are equal, round, and reactive to light.     Comments: Positive for pallor.  Cardiovascular:     Rate and Rhythm: Normal rate and regular rhythm.     Heart sounds: Murmur heard.  Pulmonary:     Effort: No respiratory distress.     Breath sounds: No wheezing.  Abdominal:     General: Bowel sounds are normal. There is no distension.     Palpations: Abdomen is soft. There is no mass.     Tenderness: There is no abdominal tenderness. There is no guarding or rebound.  Musculoskeletal:        General: No tenderness. Normal range of motion.     Cervical back: Normal range of motion and neck supple.  Skin:    General: Skin is warm.  Neurological:     Mental Status: She is alert and oriented to person, place, and time.  Psychiatric:        Mood and Affect: Affect normal.     LABORATORY DATA:  I have reviewed the data as listed    Component Value Date/Time   NA 141 06/01/2024 1255   NA 143 01/10/2013 0314   K 3.8 06/01/2024 1255   K 4.0 01/10/2013 0314   CL 108 06/01/2024 1255   CL 109 (H) 01/10/2013 0314   CO2 23 06/01/2024 1255   CO2 27 01/10/2013 0314   GLUCOSE 142 (H) 06/01/2024 1255   GLUCOSE 119 (H) 01/10/2013 0314   BUN 18 06/01/2024 1255   BUN 15 01/10/2013 0314   CREATININE 1.23 (H) 06/01/2024 1255   CREATININE 1.03 01/10/2013 0314   CREATININE 1.13 (H) 09/18/2012 0848   CALCIUM  9.1 06/01/2024 1255   CALCIUM  8.1 (L) 01/10/2013 0314   PROT 7.2 05/11/2024 1557   PROT 7.2 12/20/2011 1521  ALBUMIN 4.4 05/11/2024 1557   ALBUMIN 3.9 12/20/2011 1521   AST 20 05/11/2024 1557   AST 19 07/08/2023 1240   ALT 13  05/11/2024 1557   ALT 15 07/08/2023 1240   ALT 37 12/20/2011 1521   ALKPHOS 42 05/11/2024 1557   ALKPHOS 57 12/20/2011 1521   BILITOT 0.7 05/11/2024 1557   BILITOT 0.5 07/08/2023 1240   GFRNONAA 42 (L) 06/01/2024 1255   GFRNONAA 52 (L) 01/10/2013 0314   GFRNONAA 47 (L) 09/18/2012 0848   GFRAA 46 (L) 07/12/2020 1240   GFRAA >60 01/10/2013 0314   GFRAA 54 (L) 09/18/2012 0848    No results found for: SPEP, UPEP  Lab Results  Component Value Date   WBC 6.8 06/01/2024   NEUTROABS 5.1 06/01/2024   HGB 13.3 06/01/2024   HCT 39.6 06/01/2024   MCV 101.3 (H) 06/01/2024   PLT 166 06/01/2024      Chemistry      Component Value Date/Time   NA 141 06/01/2024 1255   NA 143 01/10/2013 0314   K 3.8 06/01/2024 1255   K 4.0 01/10/2013 0314   CL 108 06/01/2024 1255   CL 109 (H) 01/10/2013 0314   CO2 23 06/01/2024 1255   CO2 27 01/10/2013 0314   BUN 18 06/01/2024 1255   BUN 15 01/10/2013 0314   CREATININE 1.23 (H) 06/01/2024 1255   CREATININE 1.03 01/10/2013 0314   CREATININE 1.13 (H) 09/18/2012 0848      Component Value Date/Time   CALCIUM  9.1 06/01/2024 1255   CALCIUM  8.1 (L) 01/10/2013 0314   ALKPHOS 42 05/11/2024 1557   ALKPHOS 57 12/20/2011 1521   AST 20 05/11/2024 1557   AST 19 07/08/2023 1240   ALT 13 05/11/2024 1557   ALT 15 07/08/2023 1240   ALT 37 12/20/2011 1521   BILITOT 0.7 05/11/2024 1557   BILITOT 0.5 07/08/2023 1240      ASSESSMENT & PLAN:   Anemia of chronic kidney failure, stage 3 (moderate) (HCC) #Severe anemia hemoglobin-nadir 6 [summer 2020]. 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 JUNE 2023- NEGATIVE for hemolysis [haptoglobin-WNL]  # Today hemoglobin is  13.8- continue to HOLD Retacrit  today. ALSO HOLD Ferrahem- stable.   # B12 def- continue B12 injections. HOLD injection today. Stable.    # CKD stage III- Recommend continued p.o. intake. Stable.    # A.fib/Stroke [Dec 2024] OFF plavix  [Dr.Shah/NP] Dr.Thomas s/p watchman device-[ APRIL 2025]- on eliquis  on 2.5 mg BID- awaiting to come off eliquis  soon.    Monthly B12; Ferra/retacrit  q m  # DISPOSITION:  # HOLD Ferrahem today;   # HOLD RETACRIT ;   # HOLD B 12 injection # in 2  month  MD-  labs- -cbc;bmp;iron studies;ferriitn; b12 - - possible Ferrahem or possible retacrit ; B12 injection- Dr.B       Cindy JONELLE Joe, MD 06/01/2024 1:34 PM

## 2024-06-03 NOTE — Telephone Encounter (Signed)
 Placed on Trisha folder.

## 2024-06-05 MED ORDER — BLOOD GLUCOSE MONITORING SUPPL DEVI: 0 refills | Status: AC

## 2024-06-05 MED ORDER — LANCETS MISC. MISC: 12 refills | Status: AC

## 2024-06-05 MED ORDER — LANCET DEVICE MISC: 0 refills | Status: AC

## 2024-06-05 MED ORDER — BLOOD GLUCOSE TEST VI STRP: ORAL_STRIP | 12 refills | Status: AC

## 2024-06-05 NOTE — Addendum Note (Signed)
 Addended by: LEARTA PORTO D on: 06/05/2024 10:24 AM   Modules accepted: Orders

## 2024-06-05 NOTE — Telephone Encounter (Signed)
 New glucometer sent to pharmacy. Pt is aware.

## 2024-07-24 ENCOUNTER — Other Ambulatory Visit: Payer: Self-pay

## 2024-07-24 DIAGNOSIS — N183 Chronic kidney disease, stage 3 unspecified: Secondary | ICD-10-CM

## 2024-07-27 ENCOUNTER — Inpatient Hospital Stay (HOSPITAL_BASED_OUTPATIENT_CLINIC_OR_DEPARTMENT_OTHER): Admitting: Internal Medicine

## 2024-07-27 ENCOUNTER — Inpatient Hospital Stay: Attending: Internal Medicine

## 2024-07-27 ENCOUNTER — Inpatient Hospital Stay

## 2024-07-27 ENCOUNTER — Encounter: Payer: Self-pay | Admitting: Internal Medicine

## 2024-07-27 VITALS — BP 136/73 | HR 77 | Temp 97.2°F | Resp 18 | Ht 59.0 in | Wt 141.0 lb

## 2024-07-27 DIAGNOSIS — D649 Anemia, unspecified: Secondary | ICD-10-CM

## 2024-07-27 DIAGNOSIS — D631 Anemia in chronic kidney disease: Secondary | ICD-10-CM | POA: Diagnosis present

## 2024-07-27 DIAGNOSIS — N183 Chronic kidney disease, stage 3 unspecified: Secondary | ICD-10-CM | POA: Diagnosis present

## 2024-07-27 DIAGNOSIS — E538 Deficiency of other specified B group vitamins: Secondary | ICD-10-CM | POA: Insufficient documentation

## 2024-07-27 DIAGNOSIS — Z79899 Other long term (current) drug therapy: Secondary | ICD-10-CM | POA: Insufficient documentation

## 2024-07-27 LAB — CBC WITH DIFFERENTIAL (CANCER CENTER ONLY)
Abs Immature Granulocytes: 0.05 K/uL (ref 0.00–0.07)
Basophils Absolute: 0.1 K/uL (ref 0.0–0.1)
Basophils Relative: 1 %
Eosinophils Absolute: 0.2 K/uL (ref 0.0–0.5)
Eosinophils Relative: 2 %
HCT: 41.1 % (ref 36.0–46.0)
Hemoglobin: 13.4 g/dL (ref 12.0–15.0)
Immature Granulocytes: 1 %
Lymphocytes Relative: 13 %
Lymphs Abs: 1.2 K/uL (ref 0.7–4.0)
MCH: 33.8 pg (ref 26.0–34.0)
MCHC: 32.6 g/dL (ref 30.0–36.0)
MCV: 103.8 fL — ABNORMAL HIGH (ref 80.0–100.0)
Monocytes Absolute: 0.4 K/uL (ref 0.1–1.0)
Monocytes Relative: 5 %
Neutro Abs: 7.3 K/uL (ref 1.7–7.7)
Neutrophils Relative %: 78 %
Platelet Count: 157 K/uL (ref 150–400)
RBC: 3.96 MIL/uL (ref 3.87–5.11)
RDW: 13.5 % (ref 11.5–15.5)
WBC Count: 9.1 K/uL (ref 4.0–10.5)
nRBC: 0 % (ref 0.0–0.2)

## 2024-07-27 LAB — IRON AND TIBC
Iron: 81 ug/dL (ref 28–170)
Saturation Ratios: 23 % (ref 10.4–31.8)
TIBC: 356 ug/dL (ref 250–450)
UIBC: 275 ug/dL

## 2024-07-27 LAB — CMP (CANCER CENTER ONLY)
ALT: 27 U/L (ref 0–44)
AST: 28 U/L (ref 15–41)
Albumin: 3.8 g/dL (ref 3.5–5.0)
Alkaline Phosphatase: 47 U/L (ref 38–126)
Anion gap: 10 (ref 5–15)
BUN: 20 mg/dL (ref 8–23)
CO2: 25 mmol/L (ref 22–32)
Calcium: 9.2 mg/dL (ref 8.9–10.3)
Chloride: 104 mmol/L (ref 98–111)
Creatinine: 1.18 mg/dL — ABNORMAL HIGH (ref 0.44–1.00)
GFR, Estimated: 44 mL/min — ABNORMAL LOW (ref >60)
Glucose, Bld: 103 mg/dL — ABNORMAL HIGH (ref 70–99)
Potassium: 4 mmol/L (ref 3.5–5.1)
Sodium: 139 mmol/L (ref 135–145)
Total Bilirubin: 0.8 mg/dL (ref 0.0–1.2)
Total Protein: 6.7 g/dL (ref 6.5–8.1)

## 2024-07-27 LAB — FERRITIN: Ferritin: 39 ng/mL (ref 11–307)

## 2024-07-27 LAB — VITAMIN B12: Vitamin B-12: 390 pg/mL (ref 180–914)

## 2024-07-27 MED ORDER — CYANOCOBALAMIN 1000 MCG/ML IJ SOLN
1000.0000 ug | Freq: Once | INTRAMUSCULAR | Status: AC
  Administered 2024-07-27: 1000 ug via INTRAMUSCULAR
  Filled 2024-07-27: qty 1

## 2024-07-27 NOTE — Addendum Note (Signed)
 Addended by: JOSHUA ALFONSO CROME on: 07/27/2024 03:41 PM   Modules accepted: Orders

## 2024-07-27 NOTE — Progress Notes (Signed)
 [facility] OFFICE PROGRESS NOTE  Patient Care Team: Glendia Shad, MD as PCP - General (Internal Medicine) Glendia Shad, MD (Internal Medicine) Ammon Blunt, MD (Internal Medicine) Jackquline Sawyer, MD (Specialist) Schnier, Cordella MATSU, MD (Vascular Surgery) Ammon Blunt, MD (Internal Medicine) Jackquline Sawyer, MD (Specialist) Schnier, Cordella MATSU, MD (Vascular Surgery) Rennie Cindy SAUNDERS, MD as Consulting Physician (Oncology)   SUMMARY OF HEMATOLOGIC HISTORY:  # IRON DEFICIENCY ANEMIA- recurrent [? GI blood loss; Dr.Elliot; AVM-capsule]; IV Ferrahem q 8m; last colo- 2012/march; EGD- [id number].  July 2020 bone marrow biopsy-mild dysplastic changes; 40% hypercellularity; cytogenetics-normal.-Retacrit .MARCH 2023- Foundation One Hem- TP53 subclonal. B12 def- FEB 2023- Start B12 injections Monthly; MAY 3rd, 2023- Start Retacrit  20K [place].   #Colonoscopy-April 2020 aborted because of cardiac pause.  # CKD stage III-IV; CAD-Dr. Ammon;  March 30th, 2022-TIA/slurred spleech [Dr.Shah]; dec 2024- affecting right eye and memory.  INTERVAL HISTORY: In a wheelchair.  With her daughter.   89 -year-old female patient with above history of recurrent iron deficiency anemia unclear etiology/?-related to CKD/ A.fib- s/p watchman device OFF eliquis ; ;stroke affecting right eye and memory -on Feraheme /Retacrit  is here for follow-up.  Patient complaints of ongoing difficulty sleeping sec to frequent urination.  Complains of ongoing fatigue-. Otherwise, Other than that, no new or acute concerns at this time. Otherwise no blood in stools or black-colored stools.  Review of Systems  Constitutional:  Positive for malaise/fatigue. Negative for chills, diaphoresis, fever and weight loss.  HENT:  Negative for nosebleeds and sore throat.   Eyes:  Negative for double vision.  Respiratory:  Negative for cough, hemoptysis, sputum production and wheezing.   Cardiovascular:   Negative for palpitations, orthopnea and leg swelling.  Gastrointestinal:  Negative for abdominal pain, blood in stool, constipation, diarrhea, heartburn, melena, nausea and vomiting.  Genitourinary:  Negative for dysuria, frequency and urgency.  Musculoskeletal:  Negative for back pain and joint pain.  Skin: Negative.  Negative for itching and rash.  Neurological:  Negative for dizziness, tingling, focal weakness, weakness and headaches.  Endo/Heme/Allergies:  Does not bruise/bleed easily.  Psychiatric/Behavioral:  Negative for depression. The patient is not nervous/anxious and does not have insomnia.      PAST MEDICAL HISTORY :  Past Medical History:  Diagnosis Date   Arthritis    AV malformation of gastrointestinal tract    Basal cell carcinoma 08/07/2023   Central upper forehead. Nodular pattern. Mohs 09/09/23   Blood in stool    CAD (coronary artery disease)    Carotid arterial disease (HCC)    Cataracts, bilateral    Chronic blood loss anemia    Chronic cystitis    CKD (chronic kidney disease), stage III (HCC)    Complication of anesthesia    reaction to propofol  - caused heart to pause   Depression    Diabetes mellitus (HCC)    GERD (gastroesophageal reflux disease)    Hyperlipidemia    Hypertension    IDA (iron deficiency anemia)    Stress incontinence    TIA (transient ischemic attack) 03/2021   No deficits   Wears dentures    full upper and lower    PAST SURGICAL HISTORY :   Past Surgical History:  Procedure Laterality Date   ABDOMINAL HYSTERECTOMY  11/19/1970   ovaries left in place   APPENDECTOMY  11/19/1990   BACK SURGERY  06/08/2010   L3, L4, L5   CARDIAC CATHETERIZATION  02/2001   CAROTID ARTERY ANGIOPLASTY  09/19/2002   CARPAL TUNNEL RELEASE  1991,  92   right then left    CATARACT EXTRACTION W/PHACO Left 02/13/2022   Procedure: CATARACT EXTRACTION PHACO AND INTRAOCULAR LENS PLACEMENT (IOC) LEFT DIABETIC 14.90 01:26.8;  Surgeon: [name],  MD;  Location: [facility];  Service: Ophthalmology;  Laterality: Left;  Diabetic   CATARACT EXTRACTION W/PHACO Right 03/06/2022   Procedure: CATARACT EXTRACTION PHACO AND INTRAOCULAR LENS PLACEMENT (IOC) RIGHT DIABETIC 9.66 00:53.7;  Surgeon: [name], MD;  Location: [facility];  Service: Ophthalmology;  Laterality: Right;  Diabetic   CHOLECYSTECTOMY  11/20/1991   COLONOSCOPY WITH PROPOFOL  N/A 02/02/2019   Procedure: COLONOSCOPY WITH PROPOFOL ;  Surgeon: [name], MD;  Location: [facility];  Service: Endoscopy;  Laterality: N/A;   ESOPHAGOGASTRODUODENOSCOPY N/A 02/02/2019   Procedure: ESOPHAGOGASTRODUODENOSCOPY (EGD);  Surgeon: [name], MD;  Location: [facility] END[facility];  Service: Endoscopy;  Laterality: N/A;   FOOT SURGERY  06/20/2007   Left   right carotid artery surgery  01/09/2013   Dr. Jama @ [facility]   TOTAL HIP ARTHROPLASTY  05/03/2011   left 12, right 11/07    FAMILY HISTORY :   Family History  Problem Relation Age of Onset   Hyperlipidemia Father    Heart disease Father        myocardial infarction   Hypertension Father        Parent   Arthritis Other        parent   Diabetes Other        nephew   Cervical cancer Sister    Rectal cancer Sister    Breast cancer Neg Hx     SOCIAL HISTORY:   Social History   Tobacco Use   Smoking status: Former    Current packs/day: 0.00    Types: Cigarettes    Quit date: 11/19/1989    Years since quitting: 34.7   Smokeless tobacco: Never  Vaping Use   Vaping status: Never Used  Substance Use Topics   Alcohol use: No    Alcohol/week: 0.0 standard drinks of alcohol   Drug use: No    ALLERGIES:  is allergic to propofol , ciprofloxacin, levaquin [levofloxacin], and tequin [gatifloxacin].  MEDICATIONS:  Current Outpatient Medications  Medication Sig Dispense Refill   Biotin  5000 MCG CAPS Take 1 capsule by mouth daily.     Blood Glucose Monitoring Suppl DEVI Use to check blood sugars  once daily. 1 each 0   cetirizine (ZYRTEC) 10 MG tablet Take 10 mg by mouth daily.     Cholecalciferol (VITAMIN D -3) 25 MCG (1000 UT) CAPS Take 1,000 Units by mouth daily.     docusate sodium  (COLACE) 100 MG capsule Take 100 mg by mouth daily as needed for mild constipation.     ferrous sulfate  325 (65 FE) MG EC tablet Take 325 mg by mouth every other day. Every other day     Glucose Blood (BLOOD GLUCOSE TEST STRIPS) STRP Use to check blood sugars once daily. 100 strip 12   hydrocortisone  2.5 % cream apply topically 2 times daily as needed for itch at right lower leg 30 g 5   ketoconazole  (NIZORAL ) 2 % shampoo MASSAGE INTO SCALP AND LET SIT SEVERAL MINUTES BEFORE RINSING. USE 2-3 TIMES A WEEK. 120 mL 2   Lancet Device MISC Use to check blood sugars once daily. 1 each 0   Lancets (ONETOUCH DELICA PLUS LANCET30G) MISC USE AS INSTRUCTED TO CHECK BLOOD SUGARS ONCE DAILY. DX E 11.9 100 each 12   Lancets Misc. MISC Use to check  blood sugars once daily. 100 each 12   metoprolol  succinate (TOPROL -XL) 25 MG 24 hr tablet Take 12.5 mg by mouth daily.     nystatin  (MYCOSTATIN ) 100000 UNIT/ML suspension 5cc's swish and spit tid. 60 mL 0   Omega-3 Fatty Acids (FISH OIL) 1200 MG CAPS Take 1 capsule by mouth daily.     Probiotic, Lactobacillus, CAPS Take 1 capsule by mouth daily.     rosuvastatin  (CRESTOR ) 40 MG tablet TAKE 1 TABLET BY MOUTH EVERY DAY 90 tablet 1   traZODone  (DESYREL ) 50 MG tablet TAKE 1/2 TO 1 TABLET BY MOUTH AT BEDTIME AS NEEDED FOR SLEEP 90 tablet 1   No current facility-administered medications for this visit.   Facility-Administered Medications Ordered in Other Visits  Medication Dose Route Frequency Provider Last Rate Last Admin   cyanocobalamin  (VITAMIN B12) injection 1,000 mcg  1,000 mcg Intramuscular Once Aadan Chenier R, MD        PHYSICAL EXAMINATION:   BP 136/73 (BP Location: Right Arm, Patient Position: Sitting, Cuff Size: Normal)   Pulse 77   Temp (!) 97.2 F (36.2  C) (Tympanic)   Resp 18   Ht 4' 11 (1.499 m)   Wt 141 lb (64 kg)   SpO2 96%   BMI 28.48 kg/m   Filed Weights   07/27/24 1427  Weight: 141 lb (64 kg)    Physical Exam Constitutional:      Comments: [place] Caucasian female patient.  She is in a wheelchair.  HENT:     Head: Normocephalic and atraumatic.     Mouth/Throat:     Pharynx: No oropharyngeal exudate.  Eyes:     Pupils: Pupils are equal, round, and reactive to light.     Comments: Positive for pallor.  Cardiovascular:     Rate and Rhythm: Normal rate and regular rhythm.     Heart sounds: Murmur heard.  Pulmonary:     Effort: No respiratory distress.     Breath sounds: No wheezing.  Abdominal:     General: Bowel sounds are normal. There is no distension.     Palpations: Abdomen is soft. There is no mass.     Tenderness: There is no abdominal tenderness. There is no guarding or rebound.  Musculoskeletal:        General: No tenderness. Normal range of motion.     Cervical back: Normal range of motion and neck supple.  Skin:    General: Skin is warm.  Neurological:     Mental Status: She is alert and oriented to person, place, and time.  Psychiatric:        Mood and Affect: Affect normal.     LABORATORY DATA:  I have reviewed the data as listed    Component Value Date/Time   NA 139 07/27/2024 1429   NA 143 01/10/2013 0314   K 4.0 07/27/2024 1429   K 4.0 01/10/2013 0314   CL 104 07/27/2024 1429   CL 109 (H) 01/10/2013 0314   CO2 25 07/27/2024 1429   CO2 27 01/10/2013 0314   GLUCOSE 103 (H) 07/27/2024 1429   GLUCOSE 119 (H) 01/10/2013 0314   BUN 20 07/27/2024 1429   BUN 15 01/10/2013 0314   CREATININE 1.18 (H) 07/27/2024 1429   CREATININE 1.03 01/10/2013 0314   CREATININE 1.13 (H) 09/18/2012 0848   CALCIUM  9.2 07/27/2024 1429   CALCIUM  8.1 (L) 01/10/2013 0314   PROT 6.7 07/27/2024 1429   PROT 7.2 12/20/2011 1521   ALBUMIN 3.8 07/27/2024 1429  ALBUMIN 3.9 12/20/2011 1521   AST 28  07/27/2024 1429   ALT 27 07/27/2024 1429   ALT 37 12/20/2011 1521   ALKPHOS 47 07/27/2024 1429   ALKPHOS 57 12/20/2011 1521   BILITOT 0.8 07/27/2024 1429   GFRNONAA 44 (L) 07/27/2024 1429   GFRNONAA 52 (L) 01/10/2013 0314   GFRNONAA 47 (L) 09/18/2012 0848   GFRAA 46 (L) 07/12/2020 1240   GFRAA >60 01/10/2013 0314   GFRAA 54 (L) 09/18/2012 0848    No results found for: SPEP, UPEP  Lab Results  Component Value Date   WBC 9.1 07/27/2024   NEUTROABS 7.3 07/27/2024   HGB 13.4 07/27/2024   HCT 41.1 07/27/2024   MCV 103.8 (H) 07/27/2024   PLT 157 07/27/2024      Chemistry      Component Value Date/Time   NA 139 07/27/2024 1429   NA 143 01/10/2013 0314   K 4.0 07/27/2024 1429   K 4.0 01/10/2013 0314   CL 104 07/27/2024 1429   CL 109 (H) 01/10/2013 0314   CO2 25 07/27/2024 1429   CO2 27 01/10/2013 0314   BUN 20 07/27/2024 1429   BUN 15 01/10/2013 0314   CREATININE 1.18 (H) 07/27/2024 1429   CREATININE 1.03 01/10/2013 0314   CREATININE 1.13 (H) 09/18/2012 0848      Component Value Date/Time   CALCIUM  9.2 07/27/2024 1429   CALCIUM  8.1 (L) 01/10/2013 0314   ALKPHOS 47 07/27/2024 1429   ALKPHOS 57 12/20/2011 1521   AST 28 07/27/2024 1429   ALT 27 07/27/2024 1429   ALT 37 12/20/2011 1521   BILITOT 0.8 07/27/2024 1429      ASSESSMENT & PLAN:   Anemia of chronic kidney failure, stage 3 (moderate) (HCC) #Severe anemia hemoglobin-nadir 6 [summer 2020]. 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 JUNE 2023- NEGATIVE for hemolysis [haptoglobin-WNL]  # Today hemoglobin is  13.8- continue to HOLD Retacrit  today. ALSO HOLD Ferrahem- stable.   # B12 def- continue B12 injections. injection today. Stable.    # CKD stage III- Recommend continued p.o. intake. Stable.   # A.fib/Stroke [Dec 2024] OFF plavix  [Dr.Shah/NP] Dr.Thomas s/p watchman device-[ APRIL 2025]- on eliquis  on 2.5  mg BID- awaiting to come off eliquis  soon.    Monthly B12; Ferra/retacrit  q m  # DISPOSITION:  # HOLD Ferrahem today;   # HOLD RETACRIT ;   # ok to give B 12 injection today # in 3  month  MD-  labs- -cbc;bmp;iron studies;ferriitn; b12 - - possible Ferrahem or possible retacrit ; B12 injection- Dr.B       Cindy JONELLE Joe, MD 07/27/2024 3:37 PM

## 2024-07-27 NOTE — Assessment & Plan Note (Addendum)
#  Severe anemia hemoglobin-nadir 6 [summer 2020]. 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 JUNE 2023- NEGATIVE for hemolysis [haptoglobin-WNL]  # Today hemoglobin is  13.8- continue to HOLD Retacrit  today. ALSO HOLD Ferrahem- stable.   # B12 def- continue B12 injections. injection today. Stable.    # CKD stage III- Recommend continued p.o. intake. Stable.   # A.fib/Stroke [Dec 2024] OFF plavix  [Dr.Shah/NP] Dr.Thomas s/p watchman device-[ APRIL 2025]- on eliquis  on 2.5 mg BID- awaiting to come off eliquis  soon.    Monthly B12; Ferra/retacrit  q m  # DISPOSITION:  # HOLD Ferrahem today;   # HOLD RETACRIT ;   # ok to give B 12 injection today # in 3  month  MD-  labs- -cbc;bmp;iron studies;ferriitn; b12 - - possible Ferrahem or possible retacrit ; B12 injection- Dr.B

## 2024-07-27 NOTE — Progress Notes (Signed)
 Fatigue/weakness: YES Dyspena: YES DOE Light headedness: NO Blood in stool: NO

## 2024-07-29 ENCOUNTER — Other Ambulatory Visit: Payer: Self-pay | Admitting: Internal Medicine

## 2024-08-11 ENCOUNTER — Ambulatory Visit: Admitting: Internal Medicine

## 2024-08-11 VITALS — BP 124/68 | HR 85 | Resp 16 | Ht 59.0 in | Wt 140.4 lb

## 2024-08-11 DIAGNOSIS — Z23 Encounter for immunization: Secondary | ICD-10-CM | POA: Diagnosis not present

## 2024-08-11 DIAGNOSIS — I2510 Atherosclerotic heart disease of native coronary artery without angina pectoris: Secondary | ICD-10-CM

## 2024-08-11 DIAGNOSIS — I700 Atherosclerosis of aorta: Secondary | ICD-10-CM

## 2024-08-11 DIAGNOSIS — D649 Anemia, unspecified: Secondary | ICD-10-CM | POA: Diagnosis not present

## 2024-08-11 DIAGNOSIS — R9389 Abnormal findings on diagnostic imaging of other specified body structures: Secondary | ICD-10-CM

## 2024-08-11 DIAGNOSIS — I10 Essential (primary) hypertension: Secondary | ICD-10-CM

## 2024-08-11 DIAGNOSIS — I779 Disorder of arteries and arterioles, unspecified: Secondary | ICD-10-CM

## 2024-08-11 DIAGNOSIS — D696 Thrombocytopenia, unspecified: Secondary | ICD-10-CM

## 2024-08-11 DIAGNOSIS — E7800 Pure hypercholesterolemia, unspecified: Secondary | ICD-10-CM | POA: Diagnosis not present

## 2024-08-11 DIAGNOSIS — E1159 Type 2 diabetes mellitus with other circulatory complications: Secondary | ICD-10-CM | POA: Diagnosis not present

## 2024-08-11 DIAGNOSIS — Z8673 Personal history of transient ischemic attack (TIA), and cerebral infarction without residual deficits: Secondary | ICD-10-CM

## 2024-08-11 DIAGNOSIS — N1832 Chronic kidney disease, stage 3b: Secondary | ICD-10-CM

## 2024-08-11 DIAGNOSIS — I480 Paroxysmal atrial fibrillation: Secondary | ICD-10-CM

## 2024-08-11 NOTE — Progress Notes (Signed)
 Subjective:    Patient ID: Jocelyn Elliott, female    DOB: [birth year], 89 y.o.   MRN: [id number]  Patient here for  Chief Complaint  Patient presents with   Medical Management of Chronic Issues    HPI Here for a scheduled follow up -  follow up regarding CKD, afib, IDA, hypertension and diabetes. She is accompanied by her daughter Grayce. History obtained from both of them. Admitted 02/26/24 - 02/27/24 - watchman implant. Had f/u with cardiology 03/17/24 - no changes made. Have discussed recent CT scan results. Discussed findings of scattered mediastinal nodes - felt likely to be reactive. Recommended f/u CT in 3 months. Also have discussed the finding of dilatation of the pancreas. Recommended dedicated pancreatic MRI protocol. Have discussed recommendations and further scanning. She declines follow up. Had f/u with Dr Rennie 07/27/24 - hgb 13.8. continue to hold retacrit . Also hold ferrahem. Continue B12 injections. Breathing stable. No chest pain reported. No abdominal pain. Bowels moving. Daughter very supportive. No SI.    Past Medical History:  Diagnosis Date   Arthritis    AV malformation of gastrointestinal tract    Basal cell carcinoma 08/07/2023   Central upper forehead. Nodular pattern. Mohs 09/09/23   Blood in stool    CAD (coronary artery disease)    Carotid arterial disease    Cataracts, bilateral    Chronic blood loss anemia    Chronic cystitis    CKD (chronic kidney disease), stage III (HCC)    Complication of anesthesia    reaction to propofol  - caused heart to pause   Depression    Diabetes mellitus (HCC)    GERD (gastroesophageal reflux disease)    Hyperlipidemia    Hypertension    IDA (iron deficiency anemia)    Stress incontinence    TIA (transient ischemic attack) 03/2021   No deficits   Wears dentures    full upper and lower   Past Surgical History:  Procedure Laterality Date   ABDOMINAL HYSTERECTOMY  11/19/1970   ovaries left in place    APPENDECTOMY  11/19/1990   BACK SURGERY  06/08/2010   L3, L4, L5   CARDIAC CATHETERIZATION  02/2001   CAROTID ARTERY ANGIOPLASTY  09/19/2002   CARPAL TUNNEL RELEASE  1991, 92   right then left    CATARACT EXTRACTION W/PHACO Left 02/13/2022   Procedure: CATARACT EXTRACTION PHACO AND INTRAOCULAR LENS PLACEMENT (IOC) LEFT DIABETIC 14.90 01:26.8;  Surgeon: [name], MD;  Location: [facility];  Service: Ophthalmology;  Laterality: Left;  Diabetic   CATARACT EXTRACTION W/PHACO Right 03/06/2022   Procedure: CATARACT EXTRACTION PHACO AND INTRAOCULAR LENS PLACEMENT (IOC) RIGHT DIABETIC 9.66 00:53.7;  Surgeon: [name], MD;  Location: [facility];  Service: Ophthalmology;  Laterality: Right;  Diabetic   CHOLECYSTECTOMY  11/20/1991   COLONOSCOPY WITH PROPOFOL  N/A 02/02/2019   Procedure: COLONOSCOPY WITH PROPOFOL ;  Surgeon: [name], MD;  Location: [facility];  Service: Endoscopy;  Laterality: N/A;   ESOPHAGOGASTRODUODENOSCOPY N/A 02/02/2019   Procedure: ESOPHAGOGASTRODUODENOSCOPY (EGD);  Surgeon: [name], MD;  Location: [facility];  Service: Endoscopy;  Laterality: N/A;   FOOT SURGERY  06/20/2007   Left   right carotid artery surgery  01/09/2013   Dr. Jama @ [facility]   TOTAL HIP ARTHROPLASTY  05/03/2011   left 12, right 11/07   Family History  Problem Relation Age of Onset   Hyperlipidemia Father    Heart disease Father        myocardial  infarction   Hypertension Father        Parent   Arthritis Other        parent   Diabetes Other        nephew   Cervical cancer Sister    Rectal cancer Sister    Breast cancer Neg Hx    Social History   Socioeconomic History   Marital status: Widowed    Spouse name: Not on file   Number of children: 1   Years of education: 38   Highest education level: Not on file  Occupational History   Occupation: Retired  Tobacco Use   Smoking status: Former    Current packs/day: 0.00    Types:  Cigarettes    Quit date: 11/19/1989    Years since quitting: 34.7   Smokeless tobacco: Never  Vaping Use   Vaping status: Never Used  Substance and Sexual Activity   Alcohol use: No    Alcohol/week: 0.0 standard drinks of alcohol   Drug use: No   Sexual activity: Not Currently  Other Topics Concern   Not on file  Social History Narrative   Regular exercise-no   Caffeine Use-yes   Social Drivers of Health   Financial Resource Strain: Low Risk  (04/02/2023)   Overall Financial Resource Strain (CARDIA)    Difficulty of Paying Living Expenses: Not hard at all  Food Insecurity: No Food Insecurity (02/28/2024)   Hunger Vital Sign    Worried About Running Out of Food in the Last Year: Never true    Ran Out of Food in the Last Year: Never true  Transportation Needs: No Transportation Needs (02/28/2024)   PRAPARE - Administrator, Civil Service (Medical): No    Lack of Transportation (Non-Medical): No  Physical Activity: Insufficiently Active (04/02/2023)   Exercise Vital Sign    Days of Exercise per Week: 7 days    Minutes of Exercise per Session: 20 min  Stress: No Stress Concern Present (04/02/2023)   [employer] of Occupational Health - Occupational Stress Questionnaire    Feeling of Stress : Not at all  Social Connections: Socially Integrated (04/02/2023)   Social Connection and Isolation Panel    Frequency of Communication with Friends and Family: More than three times a week    Frequency of Social Gatherings with Friends and Family: More than three times a week    Attends Religious Services: More than 4 times per year    Active Member of [employer] or Organizations: Yes    Attends Engineer, structural: More than 4 times per year    Marital Status: Married     Review of Systems  Constitutional:  Negative for appetite change and unexpected weight change.  HENT:  Negative for congestion and sinus pressure.   Respiratory:  Negative for cough, chest tightness  and shortness of breath.   Cardiovascular:  Negative for chest pain, palpitations and leg swelling.  Gastrointestinal:  Negative for abdominal pain, diarrhea, nausea and vomiting.  Genitourinary:  Negative for difficulty urinating and dysuria.  Musculoskeletal:  Negative for joint swelling and myalgias.  Skin:  Negative for color change and rash.  Neurological:  Negative for dizziness and headaches.  Psychiatric/Behavioral:  Negative for agitation and dysphoric mood.        Objective:     BP 124/68   Pulse 85   Resp 16   Ht 4' 11 (1.499 m)   Wt 140 lb 6.4 oz (63.7 kg)   SpO2  98%   BMI 28.36 kg/m  Wt Readings from Last 3 Encounters:  08/11/24 140 lb 6.4 oz (63.7 kg)  07/27/24 141 lb (64 kg)  06/01/24 137 lb 8 oz (62.4 kg)    Physical Exam Vitals reviewed.  Constitutional:      General: She is not in acute distress.    Appearance: Normal appearance.  HENT:     Head: Normocephalic and atraumatic.     Right Ear: External ear normal.     Left Ear: External ear normal.     Mouth/Throat:     Pharynx: No oropharyngeal exudate or posterior oropharyngeal erythema.  Eyes:     General: No scleral icterus.       Right eye: No discharge.        Left eye: No discharge.     Conjunctiva/sclera: Conjunctivae normal.  Neck:     Thyroid : No thyromegaly.  Cardiovascular:     Rate and Rhythm: Normal rate and regular rhythm.  Pulmonary:     Effort: No respiratory distress.     Breath sounds: Normal breath sounds. No wheezing.  Abdominal:     General: Bowel sounds are normal.     Palpations: Abdomen is soft.     Tenderness: There is no abdominal tenderness.  Musculoskeletal:        General: No swelling or tenderness.     Cervical back: Neck supple. No tenderness.  Lymphadenopathy:     Cervical: No cervical adenopathy.  Skin:    Findings: No erythema or rash.  Neurological:     Mental Status: She is alert.  Psychiatric:        Mood and Affect: Mood normal.        Behavior:  Behavior normal.         Outpatient Encounter Medications as of 08/11/2024  Medication Sig   Biotin  5000 MCG CAPS Take 1 capsule by mouth daily.   Blood Glucose Monitoring Suppl DEVI Use to check blood sugars once daily.   cetirizine (ZYRTEC) 10 MG tablet Take 10 mg by mouth daily.   Cholecalciferol (VITAMIN D -3) 25 MCG (1000 UT) CAPS Take 1,000 Units by mouth daily.   docusate sodium  (COLACE) 100 MG capsule Take 100 mg by mouth daily as needed for mild constipation.   ferrous sulfate  325 (65 FE) MG EC tablet Take 325 mg by mouth every other day. Every other day   Glucose Blood (BLOOD GLUCOSE TEST STRIPS) STRP Use to check blood sugars once daily.   hydrocortisone  2.5 % cream apply topically 2 times daily as needed for itch at right lower leg   Lancet Device MISC Use to check blood sugars once daily.   Lancets (ONETOUCH DELICA PLUS LANCET30G) MISC USE AS INSTRUCTED TO CHECK BLOOD SUGARS ONCE DAILY. DX E 11.9   Lancets Misc. MISC Use to check blood sugars once daily.   metoprolol  succinate (TOPROL -XL) 25 MG 24 hr tablet Take 12.5 mg by mouth daily.   Omega-3 Fatty Acids (FISH OIL) 1200 MG CAPS Take 1 capsule by mouth daily.   Probiotic, Lactobacillus, CAPS Take 1 capsule by mouth daily.   rosuvastatin  (CRESTOR ) 40 MG tablet TAKE 1 TABLET BY MOUTH EVERY DAY   traZODone  (DESYREL ) 50 MG tablet TAKE 1/2 TO 1 TABLET BY MOUTH AT BEDTIME AS NEEDED FOR SLEEP   [DISCONTINUED] ketoconazole  (NIZORAL ) 2 % shampoo MASSAGE INTO SCALP AND LET SIT SEVERAL MINUTES BEFORE RINSING. USE 2-3 TIMES A WEEK.   [DISCONTINUED] nystatin  (MYCOSTATIN ) 100000 UNIT/ML suspension 5cc's swish and  spit tid.   No facility-administered encounter medications on file as of 08/11/2024.     Lab Results  Component Value Date   WBC 9.1 07/27/2024   HGB 13.4 07/27/2024   HCT 41.1 07/27/2024   PLT 157 07/27/2024   GLUCOSE 111 (H) 08/11/2024   CHOL 147 08/11/2024   TRIG 95.0 08/11/2024   HDL 67.20 08/11/2024   LDLDIRECT  51.0 04/14/2019   LDLCALC 61 08/11/2024   ALT 17 08/11/2024   AST 20 08/11/2024   NA 143 08/11/2024   K 4.3 08/11/2024   CL 105 08/11/2024   CREATININE 1.21 (H) 08/11/2024   BUN 22 08/11/2024   CO2 28 08/11/2024   TSH 1.29 05/11/2024   INR 1.2 02/28/2023   HGBA1C 6.4 08/11/2024    ECHOCARDIOGRAM COMPLETE Result Date: 10/31/2023    ECHOCARDIOGRAM REPORT   Patient Name:   LIZETTE PAZOS Date of Exam: 10/30/2023 Medical Rec #:  [id number]        Height:       59.0 in Accession #:    [id number]       Weight:       138.0 lb Date of Birth:  July 31, 1932        BSA:          1.575 m Patient Age:    89 years         BP:           136/68 mmHg Patient Gender: F                HR:           89 bpm. Exam Location:  [facility] Procedure: 2D Echo, Cardiac Doppler and Color Doppler Indications:     I63.9 Stroke  History:         Patient has no prior history of Echocardiogram examinations.                  CAD, TIA; Risk Factors:Hypertension, Diabetes and Dyslipidemia.  Sonographer:     Carl Coma RDCS Referring Phys:  [id number] ASHISH ARORA Diagnosing Phys: Dwayne D Callwood MD IMPRESSIONS  1. Left ventricular ejection fraction, by estimation, is 55 to 60%. The left ventricle has normal function. The left ventricle has no regional wall motion abnormalities. Left ventricular diastolic parameters are consistent with Grade III diastolic dysfunction (restrictive).  2. Right ventricular systolic function is normal. The right ventricular size is normal.  3. Left atrial size was moderately dilated.  4. Right atrial size was mild to moderately dilated.  5. The mitral valve is abnormal. Moderate mitral valve regurgitation. Moderate to severe mitral annular calcification.  6. The aortic valve is calcified. Aortic valve regurgitation is mild. Mild aortic valve stenosis. FINDINGS  Left Ventricle: Left ventricular ejection fraction, by estimation, is 55 to 60%. The left ventricle has normal function. The left ventricle has  no regional wall motion abnormalities. The left ventricular internal cavity size was normal in size. There is  no left ventricular hypertrophy. Left ventricular diastolic parameters are consistent with Grade III diastolic dysfunction (restrictive). Right Ventricle: The right ventricular size is normal. No increase in right ventricular wall thickness. Right ventricular systolic function is normal. Left Atrium: Left atrial size was moderately dilated. Right Atrium: Right atrial size was mild to moderately dilated. Pericardium: There is no evidence of pericardial effusion. The mitral valve is abnormal. There is moderate thickening of the mitral valve leaflet(s). There is severe calcification of the mitral valve leaflet(s). Mildly decreased  mobility of the mitral valve leaflets. Moderate to severe mitral annular calcification. Moderate mitral valve regurgitation. Tricuspid Valve: The tricuspid valve is grossly normal. Tricuspid valve regurgitation is mild. The aortic valve is calcified. Aortic valve regurgitation is mild. Mild aortic stenosis is present. Pulmonic Valve: The pulmonic valve was normal in structure. Pulmonic valve regurgitation is not visualized. Aorta: The ascending aorta was not well visualized. IAS/Shunts: No atrial level shunt detected by color flow Doppler.  LEFT VENTRICLE PLAX 2D LVIDd:         4.70 cm LVIDs:         3.30 cm LV PW:         0.90 cm LV IVS:        0.70 cm LVOT diam:     1.60 cm LV SV:         57 LV SV Index:   36 LVOT Area:     2.01 cm  RIGHT VENTRICLE            IVC RV Basal diam:  3.20 cm    IVC diam: 1.30 cm RV S prime:     9.99 cm/s TAPSE (M-mode): 1.6 cm LEFT ATRIUM             Index        RIGHT ATRIUM          Index LA diam:        5.00 cm 3.17 cm/m   RA Area:     9.03 cm LA Vol (A2C):   41.2 ml 26.15 ml/m  RA Volume:   19.70 ml 12.51 ml/m LA Vol (A4C):   32.6 ml 20.69 ml/m LA Biplane Vol: 38.6 ml 24.50 ml/m  AORTIC VALVE AV Area (Vmax):    1.44 cm AV Area (Vmean):    1.41 cm AV Area (VTI):     1.43 cm AV Vmax:           215.60 cm/s AV Vmean:          151.000 cm/s AV VTI:            0.399 m AV Peak Grad:      18.6 mmHg AV Mean Grad:      10.6 mmHg LVOT Vmax:         154.67 cm/s LVOT Vmean:        105.667 cm/s LVOT VTI:          0.284 m LVOT/AV VTI ratio: 0.71  AORTA Ao Root diam: 3.10 cm Ao Asc diam:  3.50 cm MITRAL VALVE                TRICUSPID VALVE MV Area (PHT): 4.74 cm     TR Peak grad:   46.2 mmHg MV Area VTI:   1.51 cm     TR Vmax:        340.00 cm/s MV Peak grad:  16.5 mmHg MV Mean grad:  7.0 mmHg     SHUNTS MV Vmax:       2.03 m/s     Systemic VTI:  0.28 m MV Vmean:      120.5 cm/s   Systemic Diam: 1.60 cm MV Decel Time: 160 msec MV E velocity: 168.00 cm/s MV A velocity: 63.40 cm/s MV E/A ratio:  2.65 Cara JONETTA Lovelace MD Electronically signed by Cara JONETTA Lovelace MD Signature Date/Time: 10/31/2023/4:09:35 PM    Final    CT ANGIO HEAD NECK W WO CM Result Date: 10/30/2023 CLINICAL DATA:  Neuro deficit,  acute, stroke suspected EXAM: CT ANGIOGRAPHY HEAD AND NECK WITH AND WITHOUT CONTRAST TECHNIQUE: Multidetector CT imaging of the head and neck was performed using the standard protocol during bolus administration of intravenous contrast. Multiplanar CT image reconstructions and MIPs were obtained to evaluate the vascular anatomy. Carotid stenosis measurements (when applicable) are obtained utilizing NASCET criteria, using the distal internal carotid diameter as the denominator. RADIATION DOSE REDUCTION: This exam was performed according to the departmental dose-optimization program which includes automated exposure control, adjustment of the mA and/or kV according to patient size and/or use of iterative reconstruction technique. CONTRAST:  75mL OMNIPAQUE  IOHEXOL  350 MG/ML SOLN COMPARISON:  None Available. FINDINGS: CT HEAD FINDINGS Brain: No hemorrhage. No hydrocephalus. No extra-axial fluid collection. There is a chronic left superior cerebellar infarct. No CT  evidence of an acute cortical infarct. Mass effect. No mass lesion. There is a background of moderate chronic microvascular ischemic change. Vascular: No hyperdense vessel or unexpected calcification. Skull: Normal. Negative for fracture or focal lesion. Sinuses/Orbits: No middle ear or mastoid effusion. Paranasal sinuses are clear. Bilateral lens replacement. Orbits are otherwise unremarkable. Other: None. Review of the MIP images confirms the above findings CTA NECK FINDINGS Aortic arch: Standard branching. Imaged portion shows no evidence of aneurysm or dissection. Atherosclerotic calcifications of the aortic arch. Moderate narrowing of the origin of the left subclavian artery likely the brachiocephalic artery. Right carotid system: No evidence of dissection, stenosis (50% or greater), or occlusion. Mild narrowing in the cavernous segment of the right ICA Left carotid system: No evidence of dissection or occlusion. Approximately 60% stenosis of the origin of the left ICA secondary to soft atherosclerotic plaque. Somewhat beaded and irregular appearance of the distal left common carotid artery, as can be seen in the setting of FMD. Vertebral arteries: Right dominant. Moderate to severe stenosis of the origin of the left vertebral artery secondary to calcified atherosclerotic plaque no evidence of dissection, stenosis (50% or greater), or occlusion. Skeleton: Grade 1 anterolisthesis of C3 on C4 and C4 on C5 Other neck: Negative. Upper chest: Negative. Review of the MIP images confirms the above findings CTA HEAD FINDINGS Anterior circulation: No significant stenosis, proximal occlusion, aneurysm, or vascular malformation. Posterior circulation: No significant stenosis, proximal occlusion, aneurysm, or vascular malformation. Moderate narrowing in the P2 segment of the left PCA Venous sinuses: As permitted by contrast timing, patent. Anatomic variants: No Review of the MIP images confirms the above findings  IMPRESSION: 1. No hemorrhage or CT evidence of an acute cortical infarct. 2. No intracranial large vessel occlusion. 3. Approximately 60% stenosis of the origin of the left ICA secondary to soft atherosclerotic plaque. 4. Moderate to severe stenosis of the origin of the left vertebral artery secondary to calcified atherosclerotic plaque. 5. Somewhat beaded and irregular appearance of the distal left common carotid artery and proximal left ICA, as can be seen in the setting of FMD. Aortic Atherosclerosis (ICD10-I70.0). Electronically Signed   By: Lyndall Gore M.D.   On: 10/30/2023 11:18   MR BRAIN WO CONTRAST Result Date: 10/30/2023 CLINICAL DATA:  Onset of right-sided weakness and confusion with cysts speech difficulty today. EXAM: MRI HEAD WITHOUT CONTRAST TECHNIQUE: Multiplanar, multiecho pulse sequences of the brain and surrounding structures were obtained without intravenous contrast. COMPARISON:  Head CT from yesterday. FINDINGS: Brain: Band of restricted diffusion at the upper and lateral left thalamus extending laterally to the periatrial white matter and medial temporal cortex. There is a background of chronic small vessel ischemic gliosis in the  deep cerebral white matter. Chronic left cerebellar infarction. No evidence of hemorrhage, hydrocephalus, mass, or collection. Vascular: Grossly preserved major flow voids by axial T2 weighted imaging. Skull and upper cervical spine: No gross marrow lesion Sinuses/Orbits: Negative Other: Progressive severe motion artifact with numerous nondiagnostic sequences. The scan was truncated before MRA and coronal T2 weighted imaging. IMPRESSION: Very motion degraded and truncated brain MRI but still positive for acute infarct in the left thalamus capsular, periatrial, and medial temporal areas (thalamoperforator versus anterior choroidal infarction). Electronically Signed   By: Dorn Roulette M.D.   On: 10/30/2023 04:39       Assessment & Plan:  Essential  (primary) hypertension -     Basic metabolic panel with GFR  Hypercholesteremia Assessment & Plan: Continue crestor .  Low cholesterol diet and exercise.  Follow lipid panel.   Orders: -     Hepatic function panel -     Lipid panel  Type 2 diabetes mellitus with other circulatory complication, without long-term current use of insulin  (HCC) Assessment & Plan: Follow met b and A1c.  Lab Results  Component Value Date   HGBA1C 6.4 08/11/2024     Orders: -     Hemoglobin A1c  Immunization due -     Flu vaccine HIGH DOSE PF(Fluzone Trivalent)  Abnormal chest CT Assessment & Plan: Have discussed recent CT scan results. Discussed findings of scattered mediastinal nodes - felt likely to be reactive. Recommended f/u CT in 3 months. Also discussed the finding of dilatation of the pancreas. Recommended dedicated pancreatic MRI protocol. Have discussed recommendations and further scanning. She declined and continues to decline. Eating. No nausea or vomiting. No abdominal pain.    Anemia, unspecified type Assessment & Plan: Followed by hematology.  History of AVM.  Also with CKD.  On eliquis .Had f/u with Dr Rennie 07/27/24 - hgb 13.8. continue to hold retacrit . Also hold ferrahem. Continue B12 injections    Aortic atherosclerosis Assessment & Plan: Continue crestor .    Coronary artery disease involving native coronary artery of native heart without angina pectoris Assessment & Plan: Remains on crestor .  Continue risk factor modifications. Overall stable.    Carotid artery disease, unspecified laterality, unspecified type Assessment & Plan:  Continue antiplatelet therapy as prescribed Continue management of CAD, HTN and Hyperlipidemia Evaluated [facility] 08/26/23 -ultrasound shows RICA 1-39% and LICA 40-59% stenosis bilaterally s/p left CEA.  Recommended f/u in 12 months.  Recent CTA (during admission) as outlined. Continue crestor .    Thrombocytopenia Assessment & Plan: Being  followed by hematology. Last platelet count wnl.    Stage 3b chronic kidney disease (HCC) Assessment & Plan: Continue to avoid antiinflammatories.  Stay hydrated.  Follow metabolic panel. Off ace inhibitor due to low blood pressure. GFR 39. Follow.    Paroxysmal atrial fibrillation [facility]) Assessment & Plan: Eliquis . 02/27/24 - ([facility]) - watchman implant. Stable. Continues on metoprolol .    Primary hypertension Assessment & Plan: Continue metoprolol . Pressure as outlined. No changes in medication. Follow metabolic panel.    History of CVA (cerebrovascular accident) Assessment & Plan: Continue eliquis . Continue crestor  and risk factor modification. S/p watchman implant 02/2024.       Allena Hamilton, MD

## 2024-08-12 LAB — HEPATIC FUNCTION PANEL
ALT: 17 U/L (ref 0–35)
AST: 20 U/L (ref 0–37)
Albumin: 4.4 g/dL (ref 3.5–5.2)
Alkaline Phosphatase: 48 U/L (ref 39–117)
Bilirubin, Direct: 0.1 mg/dL (ref 0.0–0.3)
Total Bilirubin: 0.6 mg/dL (ref 0.2–1.2)
Total Protein: 6.7 g/dL (ref 6.0–8.3)

## 2024-08-12 LAB — BASIC METABOLIC PANEL WITH GFR
BUN: 22 mg/dL (ref 6–23)
CO2: 28 meq/L (ref 19–32)
Calcium: 10 mg/dL (ref 8.4–10.5)
Chloride: 105 meq/L (ref 96–112)
Creatinine, Ser: 1.21 mg/dL — ABNORMAL HIGH (ref 0.40–1.20)
GFR: 39.55 mL/min — ABNORMAL LOW (ref >60.00)
Glucose, Bld: 111 mg/dL — ABNORMAL HIGH (ref 70–99)
Potassium: 4.3 meq/L (ref 3.5–5.1)
Sodium: 143 meq/L (ref 135–145)

## 2024-08-12 LAB — LIPID PANEL
Cholesterol: 147 mg/dL (ref 0–200)
HDL: 67.2 mg/dL (ref >39.00)
LDL Cholesterol: 61 mg/dL (ref 0–99)
NonHDL: 79.56
Total CHOL/HDL Ratio: 2
Triglycerides: 95 mg/dL (ref 0.0–149.0)
VLDL: 19 mg/dL (ref 0.0–40.0)

## 2024-08-12 LAB — HEMOGLOBIN A1C: Hgb A1c MFr Bld: 6.4 % (ref 4.6–6.5)

## 2024-08-13 ENCOUNTER — Ambulatory Visit: Payer: Self-pay | Admitting: Internal Medicine

## 2024-08-16 ENCOUNTER — Encounter: Payer: Self-pay | Admitting: Internal Medicine

## 2024-08-16 NOTE — Assessment & Plan Note (Signed)
 Being followed by hematology. Last platelet count wnl.

## 2024-08-16 NOTE — Assessment & Plan Note (Signed)
 Followed by hematology.  History of AVM.  Also with CKD.  On eliquis .Had f/u with Dr Rennie 07/27/24 - hgb 13.8. continue to hold retacrit . Also hold ferrahem. Continue B12 injections

## 2024-08-16 NOTE — Assessment & Plan Note (Signed)
 Follow met b and A1c.  Lab Results  Component Value Date   HGBA1C 6.4 08/11/2024

## 2024-08-16 NOTE — Assessment & Plan Note (Signed)
Continue crestor.  Low cholesterol diet and exercise.  Follow lipid panel.  

## 2024-08-16 NOTE — Assessment & Plan Note (Signed)
 Continue crestor

## 2024-08-16 NOTE — Assessment & Plan Note (Signed)
 Remains on crestor .  Continue risk factor modifications. Overall stable.

## 2024-08-16 NOTE — Assessment & Plan Note (Signed)
 Continue to avoid antiinflammatories.  Stay hydrated.  Follow metabolic panel. Off ace inhibitor due to low blood pressure. GFR 39. Follow.

## 2024-08-16 NOTE — Assessment & Plan Note (Signed)
 Continue antiplatelet therapy as prescribed Continue management of CAD, HTN and Hyperlipidemia Evaluated [facility] 08/26/23 -ultrasound shows RICA 1-39% and LICA 40-59% stenosis bilaterally s/p left CEA.  Recommended f/u in 12 months.  Recent CTA (during admission) as outlined. Continue crestor .

## 2024-08-16 NOTE — Assessment & Plan Note (Signed)
 Continue metoprolol . Pressure as outlined. No changes in medication. Follow metabolic panel.

## 2024-08-16 NOTE — Assessment & Plan Note (Signed)
 Have discussed recent CT scan results. Discussed findings of scattered mediastinal nodes - felt likely to be reactive. Recommended f/u CT in 3 months. Also discussed the finding of dilatation of the pancreas. Recommended dedicated pancreatic MRI protocol. Have discussed recommendations and further scanning. She declined and continues to decline. Eating. No nausea or vomiting. No abdominal pain.

## 2024-08-16 NOTE — Assessment & Plan Note (Signed)
 Continue eliquis . Continue crestor  and risk factor modification. S/p watchman implant 02/2024.

## 2024-08-16 NOTE — Assessment & Plan Note (Signed)
 Eliquis . 02/27/24 - ([facility]) - watchman implant. Stable. Continues on metoprolol .

## 2024-08-22 NOTE — Progress Notes (Unsigned)
 MRN : [id number]  Jocelyn Elliott is a 89 y.o. (1932/10/07) female who presents with chief complaint of check carotid arteries.  History of Present Illness:   The patient is seen for follow up evaluation of carotid stenosis. The carotid stenosis followed by ultrasound.   She is s/p right CEA 01/09/2013 and Left CEA 10/08/2002.   The patient denies amaurosis fugax. There is no recent history of TIA symptoms or focal motor deficits. There is no prior documented CVA.   The patient is taking enteric-coated aspirin  81 mg daily.   There is no history of migraine headaches. There is no history of seizures.   The patient has a history of coronary artery disease, no recent episodes of angina or shortness of breath. The patient denies PAD or claudication symptoms. There is a history of hyperlipidemia which is being treated with a statin.     Carotid Duplex done today shows patent bilateral CEA's with 1-39% RICA and 40-50% LICA restenosis.  No significant change compared to last study.  No outpatient medications have been marked as taking for the 08/24/24 encounter (Appointment) with Jama, Cordella MATSU, MD.    Past Medical History:  Diagnosis Date   Arthritis    AV malformation of gastrointestinal tract    Basal cell carcinoma 08/07/2023   Central upper forehead. Nodular pattern. Mohs 09/09/23   Blood in stool    CAD (coronary artery disease)    Carotid arterial disease    Cataracts, bilateral    Chronic blood loss anemia    Chronic cystitis    CKD (chronic kidney disease), stage III (HCC)    Complication of anesthesia    reaction to propofol  - caused heart to pause   Depression    Diabetes mellitus (HCC)    GERD (gastroesophageal reflux disease)    Hyperlipidemia    Hypertension    IDA (iron deficiency anemia)    Stress incontinence    TIA (transient ischemic attack) 03/2021   No deficits   Wears dentures    full upper and lower    Past Surgical  History:  Procedure Laterality Date   ABDOMINAL HYSTERECTOMY  11/19/1970   ovaries left in place   APPENDECTOMY  11/19/1990   BACK SURGERY  06/08/2010   L3, L4, L5   CARDIAC CATHETERIZATION  02/2001   CAROTID ARTERY ANGIOPLASTY  09/19/2002   CARPAL TUNNEL RELEASE  1991, 92   right then left    CATARACT EXTRACTION W/PHACO Left 02/13/2022   Procedure: CATARACT EXTRACTION PHACO AND INTRAOCULAR LENS PLACEMENT (IOC) LEFT DIABETIC 14.90 01:26.8;  Surgeon: [name], MD;  Location: [facility];  Service: Ophthalmology;  Laterality: Left;  Diabetic   CATARACT EXTRACTION W/PHACO Right 03/06/2022   Procedure: CATARACT EXTRACTION PHACO AND INTRAOCULAR LENS PLACEMENT (IOC) RIGHT DIABETIC 9.66 00:53.7;  Surgeon: [name], MD;  [facility];  Service: Ophthalmology;  Laterality: Right;  Diabetic   CHOLECYSTECTOMY  11/20/1991   COLONOSCOPY WITH PROPOFOL  N/A 02/02/2019   Procedure: COLONOSCOPY WITH PROPOFOL ;  Surgeon: [name], MD;  Location: [facility];  Service: Endoscopy;  Laterality: N/A;   ESOPHAGOGASTRODUODENOSCOPY N/A 02/02/2019   Procedure: ESOPHAGOGASTRODUODENOSCOPY (EGD);  Surgeon: [name], MD;  Location: [facility];  Service: Endoscopy;  Laterality: N/A;   FOOT SURGERY  06/20/2007   Left   right carotid artery surgery  01/09/2013  Dr. Jama @ [facility]   TOTAL HIP ARTHROPLASTY  05/03/2011   left 12, right 11/07    Social History Social History   Tobacco Use   Smoking status: Former    Current packs/day: 0.00    Types: Cigarettes    Quit date: 11/19/1989    Years since quitting: 34.7   Smokeless tobacco: Never  Vaping Use   Vaping status: Never Used  Substance Use Topics   Alcohol use: No    Alcohol/week: 0.0 standard drinks of alcohol   Drug use: No    Family History Family History  Problem Relation Age of Onset   Hyperlipidemia Father    Heart disease Father        myocardial infarction   Hypertension  Father        Parent   Arthritis Other        parent   Diabetes Other        nephew   Cervical cancer Sister    Rectal cancer Sister    Breast cancer Neg Hx     Allergies  Allergen Reactions   Propofol  Other (See Comments)    Caused heart to pause   Ciprofloxacin Rash   Levaquin [Levofloxacin] Rash   Tequin [Gatifloxacin] Hives and Rash     REVIEW OF SYSTEMS (Negative unless checked)  Constitutional: [] Weight loss  [] Fever  [] Chills Cardiac: [] Chest pain   [] Chest pressure   [] Palpitations   [] Shortness of breath when laying flat   [] Shortness of breath with exertion. Vascular:  [x] Pain in legs with walking   [] Pain in legs at rest  [] History of DVT   [] Phlebitis   [] Swelling in legs   [] Varicose veins   [] Non-healing ulcers Pulmonary:   [] Uses home oxygen   [] Productive cough   [] Hemoptysis   [] Wheeze  [] COPD   [] Asthma Neurologic:  [] Dizziness   [] Seizures   [] History of stroke   [] History of TIA  [] Aphasia   [] Vissual changes   [] Weakness or numbness in arm   [] Weakness or numbness in leg Musculoskeletal:   [] Joint swelling   [] Joint pain   [] Low back pain Hematologic:  [] Easy bruising  [] Easy bleeding   [] Hypercoagulable state   [] Anemic Gastrointestinal:  [] Diarrhea   [] Vomiting  [] Gastroesophageal reflux/heartburn   [] Difficulty swallowing. Genitourinary:  [] Chronic kidney disease   [] Difficult urination  [] Frequent urination   [] Blood in urine Skin:  [] Rashes   [] Ulcers  Psychological:  [] History of anxiety   []  History of major depression.  Physical Examination  There were no vitals filed for this visit. There is no height or weight on file to calculate BMI. Gen: WD/WN, NAD Head: [place]/AT, No temporalis wasting.  Ear/Nose/Throat: Hearing grossly intact, nares w/o erythema or drainage Eyes: PER, EOMI, sclera nonicteric.  Neck: Supple, no masses.  No bruit or JVD.  Pulmonary:  Good air movement, no audible wheezing, no use of accessory muscles.  Cardiac: RRR, normal  S1, S2, no Murmurs. Vascular:  carotid bruit noted Vessel Right Left  Radial Palpable Palpable  Carotid  Palpable  Palpable  Subclav  Palpable Palpable  Gastrointestinal: soft, non-distended. No guarding/no peritoneal signs.  Musculoskeletal: M/S 5/5 throughout.  No visible deformity.  Neurologic: CN 2-12 intact. Pain and light touch intact in extremities.  Symmetrical.  Speech is fluent. Motor exam as listed above. Psychiatric: Judgment intact, Mood & affect appropriate for pt's clinical situation. Dermatologic: No rashes or ulcers noted.  No changes consistent with cellulitis.   CBC Lab Results  Component Value Date   WBC 9.1 07/27/2024   HGB 13.4 07/27/2024   HCT 41.1 07/27/2024   MCV 103.8 (H) 07/27/2024   PLT 157 07/27/2024    BMET    Component Value Date/Time   NA 143 08/11/2024 1604   NA 143 01/10/2013 0314   K 4.3 08/11/2024 1604   K 4.0 01/10/2013 0314   CL 105 08/11/2024 1604   CL 109 (H) 01/10/2013 0314   CO2 28 08/11/2024 1604   CO2 27 01/10/2013 0314   GLUCOSE 111 (H) 08/11/2024 1604   GLUCOSE 119 (H) 01/10/2013 0314   BUN 22 08/11/2024 1604   BUN 15 01/10/2013 0314   CREATININE 1.21 (H) 08/11/2024 1604   CREATININE 1.18 (H) 07/27/2024 1429   CREATININE 1.03 01/10/2013 0314   CREATININE 1.13 (H) 09/18/2012 0848   CALCIUM  10.0 08/11/2024 1604   CALCIUM  8.1 (L) 01/10/2013 0314   GFRNONAA 44 (L) 07/27/2024 1429   GFRNONAA 52 (L) 01/10/2013 0314   GFRNONAA 47 (L) 09/18/2012 0848   GFRAA 46 (L) 07/12/2020 1240   GFRAA >60 01/10/2013 0314   GFRAA 54 (L) 09/18/2012 0848   Estimated Creatinine Clearance: 25.1 mL/min (A) (by C-G formula based on SCr of 1.21 mg/dL (H)).  COAG Lab Results  Component Value Date   INR 1.2 02/28/2023   INR 1.0 06/15/2019   INR 1.0 01/10/2013    Radiology No results found.   Assessment/Plan There are no diagnoses linked to this encounter.   Cordella Shawl, MD  08/22/2024 12:05 PM

## 2024-08-24 ENCOUNTER — Encounter (INDEPENDENT_AMBULATORY_CARE_PROVIDER_SITE_OTHER): Payer: Self-pay | Admitting: Vascular Surgery

## 2024-08-24 ENCOUNTER — Ambulatory Visit (INDEPENDENT_AMBULATORY_CARE_PROVIDER_SITE_OTHER): Payer: Medicare Other

## 2024-08-24 ENCOUNTER — Ambulatory Visit (INDEPENDENT_AMBULATORY_CARE_PROVIDER_SITE_OTHER): Payer: Medicare Other | Admitting: Vascular Surgery

## 2024-08-24 VITALS — BP 127/79 | HR 78 | Resp 16 | Ht 59.0 in | Wt 140.0 lb

## 2024-08-24 DIAGNOSIS — I10 Essential (primary) hypertension: Secondary | ICD-10-CM | POA: Diagnosis not present

## 2024-08-24 DIAGNOSIS — E1159 Type 2 diabetes mellitus with other circulatory complications: Secondary | ICD-10-CM

## 2024-08-24 DIAGNOSIS — I700 Atherosclerosis of aorta: Secondary | ICD-10-CM | POA: Diagnosis not present

## 2024-08-24 DIAGNOSIS — I779 Disorder of arteries and arterioles, unspecified: Secondary | ICD-10-CM

## 2024-08-24 DIAGNOSIS — I2510 Atherosclerotic heart disease of native coronary artery without angina pectoris: Secondary | ICD-10-CM | POA: Diagnosis not present

## 2024-08-25 ENCOUNTER — Encounter (INDEPENDENT_AMBULATORY_CARE_PROVIDER_SITE_OTHER): Payer: Self-pay | Admitting: Vascular Surgery

## 2024-10-12 ENCOUNTER — Ambulatory Visit: Payer: Self-pay

## 2024-10-12 ENCOUNTER — Telehealth: Payer: Self-pay

## 2024-10-12 NOTE — Telephone Encounter (Signed)
 Copied from CRM #[id number]. Topic: General - Other >> Oct 12, 2024  8:57 AM Thersia BROCKS wrote: Reason for CRM: Patient daughter Grayce called in would like to speak with Carley would like a callback  [phone]

## 2024-10-12 NOTE — Telephone Encounter (Signed)
 Pt sent mychart message which was routed to PCP

## 2024-10-12 NOTE — Telephone Encounter (Signed)
 Pt called and phone went straight to voicemail

## 2024-10-12 NOTE — Telephone Encounter (Signed)
 Please call and let her know that I am seeing pts currently. Please find out what symptoms she is currently having (want to make sure no urgent need).

## 2024-10-12 NOTE — Telephone Encounter (Addendum)
 FYI Only or Action Required?: Action required by provider: medication refill request. Requesting to have Z-pack sent to pharmacy on file. Daughter Jocelyn Elliott requesting call back: [phone]  Patient was last seen in primary care on 08/11/2024 by Glendia Shad, MD.  Called Nurse Triage reporting Nasal Congestion (/) and Cough.  Symptoms began several days ago.  Interventions attempted: OTC medications: Mucinex , nasicort.  Symptoms are: gradually worsening.  Triage Disposition: See HCP Within 4 Hours (Or PCP Triage)  Patient/caregiver understands and will follow disposition?: No, wishes to speak with PCP  Summary: Patient's daughter request Z-Pak for patient   Reason for Triage: Patient's daughter Jocelyn Elliott called in at first requesting to speak with a nurse within the office. The nurse is no longer in the practice. However, Dr. Glendia messaged back to get patients symptoms to see if Z-pak needs to be called in or if urgent care is needed  and the symptoms are:  Full of congestion in chest and head. No fever. Been giving mucinex  every twelve 12 hrs and nasal spray. Sore throat probably from the nasal drainage. Cough- coughing up yellow mucus. Nothing has been given for cough. There's a lot of congestion. no pain. Scared of pnemonia.  Patient's daughter, Jocelyn Elliott [phone] (M)          Reason for Disposition  [1] MILD difficulty breathing (e.g., minimal/no SOB at rest, SOB with walking, pulse < 100) AND [2] still present when not coughing  Answer Assessment - Initial Assessment Questions Spoke with pts daughter Jocelyn Elliott (on [place]). Reports pt has had mild-moderate cough with yellow sputum, sore throat nasal and chest congestion since Saturday. Denies fever or coughing up blood. Mild SOB and mild intermittent wheezing r/t congestion. Pt with hx of mild asthma, not using inhalers. Advised UC today, declines. Given pts advanced age and that she is currently feeling under the weather pt isn't in great  condition to leave the house. No virtual UC appts available today. Wanting to know if Z-Pack can be sent in. Pts preferred pharmacy [id number] - [place], [place] - [address] Call back number for Robin: Call Answered. Sending request to office. Advised UC or ED for worsening symptoms.  1. ONSET: When did the cough begin?      Saturday  2. SEVERITY: How bad is the cough today?      Mild-moderate  3. SPUTUM: Describe the color of your sputum (e.g., none, dry cough; clear, white, yellow, green)     Yellow  4. HEMOPTYSIS: Are you coughing up any blood? If Yes, ask: How much? (e.g., flecks, streaks, tablespoons, etc.)     Denies  5. DIFFICULTY BREATHING: Are you having difficulty breathing? If Yes, ask: How bad is it? (e.g., mild, moderate, severe)      Mild Sob r/t congestion. Mild intermittent wheezing.   6. FEVER: Do you have a fever? If Yes, ask: What is your temperature, how was it measured, and when did it start?     Denies  7. CARDIAC HISTORY: Do you have any history of heart disease? (e.g., heart attack, congestive heart failure)      Hx of CVA 1 year ago, afib  8. LUNG HISTORY: Do you have any history of lung disease?  (e.g., pulmonary embolus, asthma, emphysema)     Hx of mild asthma. Not using inhalers currently.  9. PE RISK FACTORS: Do you have a history of blood clots? (or: recent major surgery, recent prolonged travel, bedridden)     Denies  10.  OTHER SYMPTOMS: Do you have any other symptoms? (e.g., runny nose, wheezing, chest pain)       Sore throat, significant nasal and chest congestion  Protocols used: Cough - Acute Productive-A-AH

## 2024-10-12 NOTE — Telephone Encounter (Unsigned)
 Copied from CRM #[id number]. Topic: General - Other >> Oct 12, 2024  8:57 AM Thersia BROCKS wrote: Reason for CRM: Patient daughter Grayce called in would like to speak with Carley would like a callback  [phone] >> Oct 12, 2024  2:26 PM Dedra B wrote: Pt's daughter, Grayce, calling to follow up on request to have Dr. Freda nurse call her regarding pt.

## 2024-10-13 ENCOUNTER — Encounter: Payer: Self-pay | Admitting: Internal Medicine

## 2024-10-13 ENCOUNTER — Telehealth: Admitting: Internal Medicine

## 2024-10-13 VITALS — Temp 97.8°F | Ht 59.0 in | Wt 140.0 lb

## 2024-10-13 DIAGNOSIS — N1832 Chronic kidney disease, stage 3b: Secondary | ICD-10-CM | POA: Diagnosis not present

## 2024-10-13 DIAGNOSIS — R051 Acute cough: Secondary | ICD-10-CM

## 2024-10-13 DIAGNOSIS — N1831 Chronic kidney disease, stage 3a: Secondary | ICD-10-CM

## 2024-10-13 DIAGNOSIS — I10 Essential (primary) hypertension: Secondary | ICD-10-CM

## 2024-10-13 DIAGNOSIS — I480 Paroxysmal atrial fibrillation: Secondary | ICD-10-CM

## 2024-10-13 DIAGNOSIS — E1159 Type 2 diabetes mellitus with other circulatory complications: Secondary | ICD-10-CM

## 2024-10-13 MED ORDER — ALBUTEROL SULFATE HFA 108 (90 BASE) MCG/ACT IN AERS: 2.0000 | INHALATION_SPRAY | Freq: Four times a day (QID) | RESPIRATORY_TRACT | 1 refills | Status: AC | PRN

## 2024-10-13 MED ORDER — AZITHROMYCIN 250 MG PO TABS: ORAL_TABLET | ORAL | 0 refills | Status: AC

## 2024-10-13 MED ORDER — PREDNISONE 10 MG PO TABS: ORAL_TABLET | ORAL | 0 refills | Status: DC

## 2024-10-13 NOTE — Progress Notes (Signed)
 Patient ID: Jocelyn Elliott, female   DOB: Aug 14, 1932, 89 y.o.   MRN: [id number]   Virtual Visit via video Note  I connected with Rilla Friend by a video enabled telemedicine application or telephone and verified that I am speaking with the correct person using two identifiers. Location patient: home Location provider: work or home office Persons participating in the virtual visit: patient, provider and pts daughter Grayce.   The limitations, risks, security and privacy concerns of performing an evaluation and management service by video and the availability of in person appointments have been discussed. It has also been discussed with the patient that there may be a patient responsible charge related to this service. The patient expressed understanding and agreed to proceed.   Reason for visit: work in appt  HPI: Work in with concerns regarding cough and congestion. States symptoms started one week ago. Noticed headache. Previous sore throat. No sore throat now. Increased sinus pressure and congestion. Increased nasal congestion - productive yellow mucus. Some chest congestion. Increased cough. Some coughing fits. No vomiting or diarrhea. No fever. Has taken mucinex . Also used nasacort .    ROS: See pertinent positives and negatives per HPI.  Past Medical History:  Diagnosis Date   Arthritis    AV malformation of gastrointestinal tract    Basal cell carcinoma 08/07/2023   Central upper forehead. Nodular pattern. Mohs 09/09/23   Blood in stool    CAD (coronary artery disease)    Carotid arterial disease    Cataracts, bilateral    Chronic blood loss anemia    Chronic cystitis    CKD (chronic kidney disease), stage III (HCC)    Complication of anesthesia    reaction to propofol  - caused heart to pause   Depression    Diabetes mellitus (HCC)    GERD (gastroesophageal reflux disease)    Hyperlipidemia    Hypertension    IDA (iron deficiency anemia)    Stress incontinence    TIA  (transient ischemic attack) 03/2021   No deficits   Wears dentures    full upper and lower    Past Surgical History:  Procedure Laterality Date   ABDOMINAL HYSTERECTOMY  11/19/1970   ovaries left in place   APPENDECTOMY  11/19/1990   BACK SURGERY  06/08/2010   L3, L4, L5   CARDIAC CATHETERIZATION  02/2001   CAROTID ARTERY ANGIOPLASTY  09/19/2002   CARPAL TUNNEL RELEASE  1991, 92   right then left    CATARACT EXTRACTION W/PHACO Left 02/13/2022   Procedure: CATARACT EXTRACTION PHACO AND INTRAOCULAR LENS PLACEMENT (IOC) LEFT DIABETIC 14.90 01:26.8;  Surgeon: [name], MD;  Location: [facility];  Service: Ophthalmology;  Laterality: Left;  Diabetic   CATARACT EXTRACTION W/PHACO Right 03/06/2022   Procedure: CATARACT EXTRACTION PHACO AND INTRAOCULAR LENS PLACEMENT (IOC) RIGHT DIABETIC 9.66 00:53.7;  Surgeon: [name], MD;  Location: [facility];  Service: Ophthalmology;  Laterality: Right;  Diabetic   CHOLECYSTECTOMY  11/20/1991   COLONOSCOPY WITH PROPOFOL  N/A 02/02/2019   Procedure: COLONOSCOPY WITH PROPOFOL ;  Surgeon: [name], MD;  Location: [facility] END[facility]COPY;  Service: Endoscopy;  Laterality: N/A;   ESOPHAGOGASTRODUODENOSCOPY N/A 02/02/2019   Procedure: ESOPHAGOGASTRODUODENOSCOPY (EGD);  Surgeon: [name], MD;  Location: [facility] ENDOSCOPY;  Service: Endoscopy;  Laterality: N/A;   FOOT SURGERY  06/20/2007   Left   LEFT ATRIAL APPENDAGE OCCLUSION Left 02/26/2024   right carotid artery surgery  01/09/2013   Dr. Jama @ [facility]   TOTAL HIP ARTHROPLASTY  05/03/2011   left 12, right 11/07    Family History  Problem Relation Age of Onset   Hyperlipidemia Father    Heart disease Father        myocardial infarction   Hypertension Father        Parent   Arthritis Other        parent   Diabetes Other        nephew   Cervical cancer Sister    Rectal cancer Sister    Breast cancer Neg Hx     SOCIAL HX: reviewed.    Current  Outpatient Medications:    albuterol  (VENTOLIN  HFA) 108 (90 Base) MCG/ACT inhaler, Inhale 2 puffs into the lungs every 6 (six) hours as needed for wheezing or shortness of breath., Disp: 18 g, Rfl: 1   azithromycin  (ZITHROMAX ) 250 MG tablet, Take 2 tablets on day 1, then 1 tablet daily on days 2 through 5, Disp: 6 tablet, Rfl: 0   predniSONE  (DELTASONE ) 10 MG tablet, Take 4 tablets x 1 day and then decrease by 1/2 tablet per day until down to zero mg., Disp: 18 tablet, Rfl: 0   Biotin  5000 MCG CAPS, Take 1 capsule by mouth daily., Disp: , Rfl:    Blood Glucose Monitoring Suppl DEVI, Use to check blood sugars once daily., Disp: 1 each, Rfl: 0   cetirizine (ZYRTEC) 10 MG tablet, Take 10 mg by mouth daily., Disp: , Rfl:    Cholecalciferol (VITAMIN D -3) 25 MCG (1000 UT) CAPS, Take 1,000 Units by mouth daily., Disp: , Rfl:    docusate sodium  (COLACE) 100 MG capsule, Take 100 mg by mouth daily as needed for mild constipation., Disp: , Rfl:    ferrous sulfate  325 (65 FE) MG EC tablet, Take 325 mg by mouth every other day. Every other day, Disp: , Rfl:    Glucose Blood (BLOOD GLUCOSE TEST STRIPS) STRP, Use to check blood sugars once daily., Disp: 100 strip, Rfl: 12   hydrocortisone  2.5 % cream, apply topically 2 times daily as needed for itch at right lower leg, Disp: 30 g, Rfl: 5   Lancet Device MISC, Use to check blood sugars once daily., Disp: 1 each, Rfl: 0   Lancets (ONETOUCH DELICA PLUS LANCET30G) MISC, USE AS INSTRUCTED TO CHECK BLOOD SUGARS ONCE DAILY. DX E 11.9, Disp: 100 each, Rfl: 12   Lancets Misc. MISC, Use to check blood sugars once daily., Disp: 100 each, Rfl: 12   metoprolol  succinate (TOPROL -XL) 25 MG 24 hr tablet, Take 12.5 mg by mouth daily., Disp: , Rfl:    Omega-3 Fatty Acids (FISH OIL) 1200 MG CAPS, Take 1 capsule by mouth daily., Disp: , Rfl:    Probiotic, Lactobacillus, CAPS, Take 1 capsule by mouth daily., Disp: , Rfl:    rosuvastatin  (CRESTOR ) 40 MG tablet, TAKE 1 TABLET BY MOUTH  EVERY DAY, Disp: 90 tablet, Rfl: 1   traZODone  (DESYREL ) 50 MG tablet, TAKE 1/2 TO 1 TABLET BY MOUTH AT BEDTIME AS NEEDED FOR SLEEP, Disp: 90 tablet, Rfl: 1  EXAM:  GENERAL: alert, oriented, appears well and in no acute distress  HEENT: atraumatic, conjunttiva clear, no obvious abnormalities on inspection of external nose and ears  NECK: normal movements of the head and neck  LUNGS: on inspection no signs of respiratory distress, breathing rate appears normal, no obvious gross SOB, gasping or wheezing. No significant increased cough - with forced expiration on exam.   CV: no obvious cyanosis  MS: moves all visible extremities  without noticeable abnormality  PSYCH/NEURO: pleasant and cooperative, no obvious depression or anxiety, speech and thought processing grossly intact  ASSESSMENT AND PLAN:  Discussed the following assessment and plan:  Problem List Items Addressed This Visit     Stage 3b chronic kidney disease (HCC) - Primary   Continue to avoid antiinflammatories.  Stay hydrated.  Follow metabolic panel. Off ace inhibitor due to low blood pressure. GFR 39 - recent check 07/2024.       Stage 3a chronic kidney disease (HCC)   Paroxysmal atrial fibrillation (HCC)   Eliquis . 02/27/24 - ([facility]) - watchman implant. Stable. Continues on metoprolol .       Hypertension   Continue metoprolol . Follow pressures.       Diabetes mellitus (HCC)   Follow met b and A1c.  Lab Results  Component Value Date   HGBA1C 6.4 08/11/2024         Cough   Increased congestion and cough as outlined. No increased sob. Some coughing fits. Continue mucinex . Continue nasacort  nasal spray and saline nasal spray as directed. Zpak. Prednisone  taper - use as directed. Albuterol  inhaler. Follow.. call with update.        Return if symptoms worsen or fail to improve.   I discussed the assessment and treatment plan with the patient. The patient was provided an opportunity to ask questions and all  were answered. The patient agreed with the plan and demonstrated an understanding of the instructions.   The patient was advised to call back or seek an in-person evaluation if the symptoms worsen or if the condition fails to improve as anticipated.    Allena Hamilton, MD

## 2024-10-13 NOTE — Telephone Encounter (Signed)
 Spoke with pt's daughter and they are okay with the virtual appt at 1:00. Appt has been scheduled.

## 2024-10-13 NOTE — Telephone Encounter (Signed)
 See if can do a virtual visit today - per our discussion.

## 2024-10-18 ENCOUNTER — Encounter: Payer: Self-pay | Admitting: Internal Medicine

## 2024-10-18 NOTE — Assessment & Plan Note (Signed)
 Continue to avoid antiinflammatories.  Stay hydrated.  Follow metabolic panel. Off ace inhibitor due to low blood pressure. GFR 39 - recent check 07/2024.

## 2024-10-18 NOTE — Assessment & Plan Note (Signed)
 Follow met b and A1c.  Lab Results  Component Value Date   HGBA1C 6.4 08/11/2024

## 2024-10-18 NOTE — Assessment & Plan Note (Signed)
 Continue metoprolol.  Follow pressures.

## 2024-10-18 NOTE — Assessment & Plan Note (Signed)
 Increased congestion and cough as outlined. No increased sob. Some coughing fits. Continue mucinex . Continue nasacort  nasal spray and saline nasal spray as directed. Zpak. Prednisone  taper - use as directed. Albuterol  inhaler. Follow.. call with update.

## 2024-10-18 NOTE — Assessment & Plan Note (Signed)
 Eliquis . 02/27/24 - ([facility]) - watchman implant. Stable. Continues on metoprolol .

## 2024-10-26 ENCOUNTER — Ambulatory Visit: Payer: Self-pay

## 2024-10-26 ENCOUNTER — Ambulatory Visit

## 2024-10-26 ENCOUNTER — Other Ambulatory Visit: Attending: Internal Medicine

## 2024-10-26 ENCOUNTER — Ambulatory Visit: Admitting: Internal Medicine

## 2024-10-26 ENCOUNTER — Encounter: Payer: Self-pay | Admitting: Internal Medicine

## 2024-10-26 ENCOUNTER — Telehealth: Admitting: Internal Medicine

## 2024-10-26 MED ORDER — VALACYCLOVIR HCL 1 G PO TABS: ORAL_TABLET | ORAL | 0 refills | Status: DC

## 2024-10-26 MED ORDER — GABAPENTIN 100 MG PO CAPS: 100.0000 mg | ORAL_CAPSULE | Freq: Three times a day (TID) | ORAL | 0 refills | Status: DC | PRN

## 2024-10-26 NOTE — Progress Notes (Unsigned)
 Patient ID: Jocelyn Elliott, female   DOB: [birth year], 89 y.o.   MRN: [id number]   Virtual Visit via video Note  I connected with Jocelyn Elliott today by a video enabled telemedicine application or telephone and verified that I am speaking with the correct person using two identifiers. Location patient: home Location provider: work or home office Persons participating in the virtual visit: patient, provider  I discussed the limitations, risks, security and privacy concerns of performing an evaluation and management service by telephone and the availability of in person appointments. I also discussed with the patient that there may be a patient responsible charge related to this service. The patient expressed understanding and agreed to proceed.  Interactive audio and video telecommunications were attempted between this provider and patient, however failed, due to patient having technical difficulties OR patient did not have access to video capability.  We continued and completed visit with audio only. ***  Reason for visit: ***  HPI: ***   ROS: See pertinent positives and negatives per HPI.  Past Medical History:  Diagnosis Date   Arthritis    AV malformation of gastrointestinal tract    Basal cell carcinoma 08/07/2023   Central upper forehead. Nodular pattern. Mohs 09/09/23   Blood in stool    CAD (coronary artery disease)    Carotid arterial disease    Cataracts, bilateral    Chronic blood loss anemia    Chronic cystitis    CKD (chronic kidney disease), stage III (HCC)    Complication of anesthesia    reaction to propofol  - caused heart to pause   Depression    Diabetes mellitus (HCC)    GERD (gastroesophageal reflux disease)    Hyperlipidemia    Hypertension    IDA (iron deficiency anemia)    Stress incontinence    TIA (transient ischemic attack) 03/2021   No deficits   Wears dentures    full upper and lower    Past Surgical History:  Procedure Laterality Date    ABDOMINAL HYSTERECTOMY  11/19/1970   ovaries left in place   APPENDECTOMY  11/19/1990   BACK SURGERY  06/08/2010   L3, L4, L5   CARDIAC CATHETERIZATION  02/2001   CAROTID ARTERY ANGIOPLASTY  09/19/2002   CARPAL TUNNEL RELEASE  1991, 92   right then left    CATARACT EXTRACTION W/PHACO Left 02/13/2022   Procedure: CATARACT EXTRACTION PHACO AND INTRAOCULAR LENS PLACEMENT (IOC) LEFT DIABETIC 14.90 01:26.8;  Surgeon: [name], MD;  Location: [facility];  Service: Ophthalmology;  Laterality: Left;  Diabetic   CATARACT EXTRACTION W/PHACO Right 03/06/2022   Procedure: CATARACT EXTRACTION PHACO AND INTRAOCULAR LENS PLACEMENT (IOC) RIGHT DIABETIC 9.66 00:53.7;  Surgeon: [name], MD;  Location: [facility];  Service: Ophthalmology;  Laterality: Right;  Diabetic   CHOLECYSTECTOMY  11/20/1991   COLONOSCOPY WITH PROPOFOL  N/A 02/02/2019   Procedure: COLONOSCOPY WITH PROPOFOL ;  Surgeon: [name], MD;  Location: [facility];  Service: Endoscopy;  Laterality: N/A;   ESOPHAGOGASTRODUODENOSCOPY N/A 02/02/2019   Procedure: ESOPHAGOGASTRODUODENOSCOPY (EGD);  Surgeon: [name], MD;  Location: [facility] END[facility]COPY;  Service: Endoscopy;  Laterality: N/A;   FOOT SURGERY  06/20/2007   Left   LEFT ATRIAL APPENDAGE OCCLUSION Left 02/26/2024   right carotid artery surgery  01/09/2013   Dr. Jama @ [facility]   TOTAL HIP ARTHROPLASTY  05/03/2011   left 12, right 11/07    Family History  Problem Relation Age of Onset   Hyperlipidemia Father  Heart disease Father        myocardial infarction   Hypertension Father        Parent   Arthritis Other        parent   Diabetes Other        nephew   Cervical cancer Sister    Rectal cancer Sister    Breast cancer Neg Hx     SOCIAL HX: ***   Current Outpatient Medications:    albuterol  (VENTOLIN  HFA) 108 (90 Base) MCG/ACT inhaler, Inhale 2 puffs into the lungs every 6 (six) hours as needed for wheezing or  shortness of breath., Disp: 18 g, Rfl: 1   Biotin  5000 MCG CAPS, Take 1 capsule by mouth daily., Disp: , Rfl:    Blood Glucose Monitoring Suppl DEVI, Use to check blood sugars once daily., Disp: 1 each, Rfl: 0   cetirizine (ZYRTEC) 10 MG tablet, Take 10 mg by mouth daily., Disp: , Rfl:    Cholecalciferol (VITAMIN D -3) 25 MCG (1000 UT) CAPS, Take 1,000 Units by mouth daily., Disp: , Rfl:    docusate sodium  (COLACE) 100 MG capsule, Take 100 mg by mouth daily as needed for mild constipation., Disp: , Rfl:    ferrous sulfate  325 (65 FE) MG EC tablet, Take 325 mg by mouth every other day. Every other day, Disp: , Rfl:    Glucose Blood (BLOOD GLUCOSE TEST STRIPS) STRP, Use to check blood sugars once daily., Disp: 100 strip, Rfl: 12   hydrocortisone  2.5 % cream, apply topically 2 times daily as needed for itch at right lower leg, Disp: 30 g, Rfl: 5   Lancet Device MISC, Use to check blood sugars once daily., Disp: 1 each, Rfl: 0   Lancets (ONETOUCH DELICA PLUS LANCET30G) MISC, USE AS INSTRUCTED TO CHECK BLOOD SUGARS ONCE DAILY. DX E 11.9, Disp: 100 each, Rfl: 12   Lancets Misc. MISC, Use to check blood sugars once daily., Disp: 100 each, Rfl: 12   metoprolol  succinate (TOPROL -XL) 25 MG 24 hr tablet, Take 12.5 mg by mouth daily., Disp: , Rfl:    Omega-3 Fatty Acids (FISH OIL) 1200 MG CAPS, Take 1 capsule by mouth daily., Disp: , Rfl:    Probiotic, Lactobacillus, CAPS, Take 1 capsule by mouth daily., Disp: , Rfl:    rosuvastatin  (CRESTOR ) 40 MG tablet, TAKE 1 TABLET BY MOUTH EVERY DAY, Disp: 90 tablet, Rfl: 1   traZODone  (DESYREL ) 50 MG tablet, TAKE 1/2 TO 1 TABLET BY MOUTH AT BEDTIME AS NEEDED FOR SLEEP, Disp: 90 tablet, Rfl: 1   predniSONE  (DELTASONE ) 10 MG tablet, Take 4 tablets x 1 day and then decrease by 1/2 tablet per day until down to zero mg. (Patient not taking: Reported on 10/26/2024), Disp: 18 tablet, Rfl: 0  EXAM:  VITALS per patient if applicable:  GENERAL: alert, oriented, appears well  and in no acute distress  HEENT: atraumatic, conjunttiva clear, no obvious abnormalities on inspection of external nose and ears  NECK: normal movements of the head and neck  LUNGS: on inspection no signs of respiratory distress, breathing rate appears normal, no obvious gross SOB, gasping or wheezing  CV: no obvious cyanosis  MS: moves all visible extremities without noticeable abnormality  PSYCH/NEURO: pleasant and cooperative, no obvious depression or anxiety, speech and thought processing grossly intact  ASSESSMENT AND PLAN:  Discussed the following assessment and plan:  Problem List Items Addressed This Visit   None   No follow-ups on file.   I discussed the assessment  and treatment plan with the patient. The patient was provided an opportunity to ask questions and all were answered. The patient agreed with the plan and demonstrated an understanding of the instructions.   The patient was advised to call back or seek an in-person evaluation if the symptoms worsen or if the condition fails to improve as anticipated.  I provided *** minutes of non-face-to-face time during this encounter.   Allena Hamilton, MD

## 2024-10-26 NOTE — Telephone Encounter (Signed)
 FYI Only or Action Required?: FYI only for provider: appointment scheduled on 10/26/24.  Patient was last seen in primary care on 10/13/2024 by Glendia Shad, MD.  Called Nurse Triage reporting Herpes Zoster.  Symptoms began several days ago.  Interventions attempted: OTC medications: Tylenol , lidocaine  cream.  Symptoms are: gradually worsening.  Triage Disposition: See HCP Within 4 Hours (Or PCP Triage)  Patient/caregiver understands and will follow disposition?: Yes   Copied from CRM [id number]. Topic: Clinical - Red Word Triage >> Oct 26, 2024 10:09 AM Charolett L wrote: [facility] that prompted transfer to Nurse Triage: horrible case of shingles Reason for Disposition  [1] Shingles rash AND [2] spots start appearing other places on body  Answer Assessment - Initial Assessment Questions Spoke with pts Robin (on DPR). Reports onset of what appears to be shingles on Saturday with constant 10/10 sharp shooting pain. Reports pt has had shingles before and current spots look just like previous episode of shingle. Red spots on right chest, collarbone, mid back, and right side of ear and jaw. Denies any spots on face. No changes to speech vision or cognition. Denies fever. Offered to schedule in person appt today, declines. Daughter states pt is 89 years old and has difficulty getting around. States their PCP is very familiar with this and has treated pt virtually before d/t mobility limitations. Virtual appt scheduled today with PCP. Advised UC or ED for worsening symptoms.   1. APPEARANCE of RASH: What does the rash look like?      Red spots  2. LOCATION: Where is the rash located?      Right chest, collarbone, mid back, and right side of ear and jaw  3. ONSET: When did the rash start?      Saturday  4. ITCHING: Does the rash itch? If Yes, ask: How bad is the itch?  (Scale 1-10; or mild, moderate, severe)     Denies  5. PAIN: Does the rash hurt? If Yes, ask: How bad is  the pain?  (Scale 0-10; or none, mild, moderate, severe)     10/10 sharp and shooting pain  6. OTHER SYMPTOMS: Do you have any other symptoms? (e.g., fever)     Denies. No fever  Protocols used: Shingles (Zoster)-A-AH

## 2024-10-26 NOTE — Telephone Encounter (Signed)
 Pt virtual visit today.

## 2024-10-30 ENCOUNTER — Telehealth: Payer: Self-pay

## 2024-10-30 NOTE — Telephone Encounter (Signed)
 If she is lethargic with slurred speech and concern regarding possible mini stroke - needs to be evaluated today. To ER for evaluation.

## 2024-10-30 NOTE — Telephone Encounter (Unsigned)
 Copied from CRM #[id number]. Topic: Clinical - Medication Question >> Oct 30, 2024 10:43 AM Mercedes MATSU wrote: Reason for CRM: Patients daughter called in stating that the Patient has been being treated for shingles and she has a few questions about the medication she is taking. Can be reached at [phone].

## 2024-11-01 ENCOUNTER — Encounter: Payer: Self-pay | Admitting: Internal Medicine

## 2024-11-01 ENCOUNTER — Telehealth: Payer: Self-pay | Admitting: Internal Medicine

## 2024-11-01 DIAGNOSIS — B029 Zoster without complications: Secondary | ICD-10-CM | POA: Insufficient documentation

## 2024-11-01 NOTE — Assessment & Plan Note (Addendum)
 Rash appears to be c/w shingles. Discussed given proximity to ear, the need for ENT evaluation. Continue tylenol . Start low dose gabapentin  100mg . Avoid taking with trazodone . Follow. Call with update.  Addendum - discussed with pt daughter. Wants to hold on referral to ENT at this time.

## 2024-11-01 NOTE — Telephone Encounter (Signed)
 Called for update. Spoke to Jocelyn Elliott. She is off the gabapentin  and trazodone . Sleeping a lot. Still with increased pain. Taking tylenol . Discussed holding on other pain medication given increased amount of sleeping and sedating properties of pain medication. Discussed ENT referral. Wants to hold on referral. Does have what appears to be a sty - right eye. Daughter reports she gets these on occasion. Discussed warm compresses. Has drops - using. She does report decreased appetite. Trying to get her to stay hydrated. Declines ER evaluation at this time. Discussed home health. Will notify me if feels needs further intervention. Will call with update.

## 2024-11-01 NOTE — Assessment & Plan Note (Signed)
 Eliquis . 02/27/24 - ([facility]) - watchman implant. Stable. Continues on metoprolol .

## 2024-11-01 NOTE — Telephone Encounter (Signed)
 Please cancel referral to ENT. Pt/daughter wants to hold on referral at this time.

## 2024-11-01 NOTE — Assessment & Plan Note (Signed)
 Continue to avoid antiinflammatories.  Stay hydrated.  Follow metabolic panel. Off ace inhibitor due to low blood pressure. GFR 39. Adjust valtrex  dosing.

## 2024-11-02 NOTE — Telephone Encounter (Signed)
 Ok Thank you referral has been cx.

## 2024-11-07 ENCOUNTER — Emergency Department: Admission: EM | Admit: 2024-11-07 | Discharge: 2024-11-07 | Disposition: A | Discharge status: Home / Self Care

## 2024-11-07 ENCOUNTER — Other Ambulatory Visit: Payer: Self-pay

## 2024-11-07 DIAGNOSIS — R531 Weakness: Secondary | ICD-10-CM | POA: Insufficient documentation

## 2024-11-07 DIAGNOSIS — I2510 Atherosclerotic heart disease of native coronary artery without angina pectoris: Secondary | ICD-10-CM | POA: Diagnosis not present

## 2024-11-07 DIAGNOSIS — E119 Type 2 diabetes mellitus without complications: Secondary | ICD-10-CM | POA: Diagnosis not present

## 2024-11-07 DIAGNOSIS — R29818 Other symptoms and signs involving the nervous system: Secondary | ICD-10-CM | POA: Insufficient documentation

## 2024-11-07 DIAGNOSIS — I10 Essential (primary) hypertension: Secondary | ICD-10-CM | POA: Diagnosis not present

## 2024-11-07 DIAGNOSIS — R944 Abnormal results of kidney function studies: Secondary | ICD-10-CM | POA: Insufficient documentation

## 2024-11-07 HISTORY — DX: Unspecified atrial fibrillation: I48.91

## 2024-11-07 LAB — COMPREHENSIVE METABOLIC PANEL WITH GFR
ALT: 85 U/L — ABNORMAL HIGH (ref 0–44)
AST: 138 U/L — ABNORMAL HIGH (ref 15–41)
Albumin: 4.2 g/dL (ref 3.5–5.0)
Alkaline Phosphatase: 61 U/L (ref 38–126)
Anion gap: 13 (ref 5–15)
BUN: 18 mg/dL (ref 8–23)
CO2: 25 mmol/L (ref 22–32)
Calcium: 9.6 mg/dL (ref 8.9–10.3)
Chloride: 103 mmol/L (ref 98–111)
Creatinine, Ser: 1.21 mg/dL — ABNORMAL HIGH (ref 0.44–1.00)
GFR, Estimated: 42 mL/min — ABNORMAL LOW
Glucose, Bld: 113 mg/dL — ABNORMAL HIGH (ref 70–99)
Potassium: 3.9 mmol/L (ref 3.5–5.1)
Sodium: 141 mmol/L (ref 135–145)
Total Bilirubin: 0.5 mg/dL (ref 0.0–1.2)
Total Protein: 6.9 g/dL (ref 6.5–8.1)

## 2024-11-07 LAB — URINALYSIS, COMPLETE (UACMP) WITH MICROSCOPIC
Bilirubin Urine: NEGATIVE
Glucose, UA: NEGATIVE mg/dL
Ketones, ur: NEGATIVE mg/dL
Nitrite: NEGATIVE
Protein, ur: 100 mg/dL — AB
Specific Gravity, Urine: 1.008 (ref 1.005–1.030)
pH: 6 (ref 5.0–8.0)

## 2024-11-07 LAB — CBC
HCT: 42.5 % (ref 36.0–46.0)
Hemoglobin: 13.7 g/dL (ref 12.0–15.0)
MCH: 33.9 pg (ref 26.0–34.0)
MCHC: 32.2 g/dL (ref 30.0–36.0)
MCV: 105.2 fL — ABNORMAL HIGH (ref 80.0–100.0)
Platelets: 207 K/uL (ref 150–400)
RBC: 4.04 MIL/uL (ref 3.87–5.11)
RDW: 15.2 % (ref 11.5–15.5)
WBC: 8.1 K/uL (ref 4.0–10.5)
nRBC: 0 % (ref 0.0–0.2)

## 2024-11-07 LAB — MAGNESIUM: Magnesium: 1.7 mg/dL (ref 1.7–2.4)

## 2024-11-07 LAB — T4, FREE: Free T4: 1.06 ng/dL (ref 0.80–2.00)

## 2024-11-07 LAB — CBG MONITORING, ED: Glucose-Capillary: 115 mg/dL — ABNORMAL HIGH (ref 70–99)

## 2024-11-07 LAB — TSH: TSH: 0.914 u[IU]/mL (ref 0.350–4.500)

## 2024-11-07 MED ORDER — SODIUM CHLORIDE 0.9 % IV BOLUS
1000.0000 mL | Freq: Once | INTRAVENOUS | Status: AC
  Administered 2024-11-07: 1000 mL via INTRAVENOUS

## 2024-11-07 NOTE — ED Provider Triage Note (Signed)
 Emergency Medicine Provider Triage Evaluation Note  Jocelyn Elliott , a 89 y.o. female  was evaluated in triage.  Pt complains of generalized weakness. Diagnosed with shingles on 12/6. Currently on valtrex . Appetite is diminished and she doesn't want to drink. Daughter concerned about dehydration. Patient is alert and oriented.  Physical Exam  BP 136/89   Pulse 79   Temp 98.2 F (36.8 C) (Oral)   Resp 20   Ht 4' 11 (1.499 m)   Wt 65 kg   SpO2 97%   BMI 28.94 kg/m  Gen:   Awake, no distress   Resp:  Normal effort  MSK:   Moves extremities without difficulty  Other:    Medical Decision Making  Medically screening exam initiated at 2:38 PM.  Appropriate orders placed.  Jocelyn Elliott was informed that the remainder of the evaluation will be completed by another provider, this initial triage assessment does not replace that evaluation, and the importance of remaining in the ED until their evaluation is complete.    Jocelyn Kirk NOVAK, FNP 11/07/24 1440

## 2024-11-07 NOTE — ED Triage Notes (Signed)
 First nurse note: Pt to ED via [facility] from home. Dx with shingles Dec 6th. Pt reports increased weakness since. Daughter believes she is dehydrated. Pt able to stand and ambulate with EMS. EMS unable to obtain temp   141/68 HR 80 30 ET  24 RR  94 CBG

## 2024-11-07 NOTE — Discharge Instructions (Addendum)
 You were seen in the emergency department for generalized weakness.  Workup today was reassuring and you are safe to return home.  Unfortunately shingles can take a long time to recover.  Continue your regular medications.  Follow-up with your primary care physician and please return if any acutely worsening symptoms. -= RETURN PRECAUTIONS & AFTERCARE: (ENGLISH) RETURN PRECAUTIONS: Return immediately to the emergency department or see/call your doctor if you feel worse, weak or have changes in speech or vision, are short of breath, have fever, vomiting, pain, bleeding or dark stool, trouble urinating or any new issues. Return here or see/call your doctor if not improving as expected for your suspected condition. FOLLOW-UP CARE: Call your doctor and/or any doctors we referred you to for more advice and to make an appointment. Do this today, tomorrow or after the weekend. Some doctors only take PPO insurance so if you have HMO insurance you may want to contact your HMO or your regular doctor for referral to a specialist within your plan. Either way tell the doctor's office that it was a referral from the emergency department so you get the soonest possible appointment.  YOUR TEST RESULTS: Take result reports of any blood or urine tests, imaging tests and EKG's to your doctor and any referral doctor. Have any abnormal tests repeated. Your doctor or a referral doctor can let you know when this should be done. Also make sure your doctor contacts this hospital to get any test results that are not currently available such as cultures or special tests for infection and final imaging reports, which are often not available at the time you leave the ER but which may list additional important findings that are not documented on the preliminary report. BLOOD PRESSURE: If your blood pressure was greater than 120/80 have your blood pressure rechecked within 1 to 2 weeks. MEDICATION SIDE EFFECTS: Do not drive, walk, bike,  take the bus, etc. if you have received or are being prescribed any sedating medications such as those for pain or anxiety or certain antihistamines like Benadryl . If you have been give one of these here get a taxi home or have a friend drive you home. Ask your pharmacist to counsel you on potential side effects of any new medication

## 2024-11-07 NOTE — ED Triage Notes (Signed)
 Pt to ED from home [facility] with daughter for generalized weakness since 12/6 when diagnosed with shingles. Currently on Valtrex . Has not taken any home meds today. Daughter believes she is dehydrated. Pt is alert and oriented.

## 2024-11-07 NOTE — ED Provider Notes (Signed)
 "  [facility] Provider Note    Event Date/Time   First MD Initiated Contact with Patient 11/07/24 1706     (approximate)   History   Weakness   HPI  Jocelyn Elliott is a 89 y.o. female with CAD, hypertension hyperlipidemia type 2 diabetes TIA, afib with watchman on ASA      Physical Exam   Triage Vital Signs: ED Triage Vitals  Encounter Vitals Group     BP 11/07/24 1433 136/89     Girls Systolic BP Percentile --      Girls Diastolic BP Percentile --      Boys Systolic BP Percentile --      Boys Diastolic BP Percentile --      Pulse Rate 11/07/24 1429 79     Resp 11/07/24 1429 20     Temp 11/07/24 1429 98.2 F (36.8 C)     Temp Source 11/07/24 1429 Oral     SpO2 11/07/24 1429 97 %     Weight 11/07/24 1431 143 lb 4.8 oz (65 kg)     Height 11/07/24 1431 4' 11 (1.499 m)     Head Circumference --      Peak Flow --      Pain Score 11/07/24 1429 8     Pain Loc --      Pain Education --      Exclude from Growth Chart --     Most recent vital signs: Vitals:   11/07/24 1429 11/07/24 1433  BP:  136/89  Pulse: 79   Resp: 20   Temp: 98.2 F (36.8 C)   SpO2: 97%     Nursing Triage Note reviewed. Vital signs reviewed and patients oxygen saturation is normoxic***  General: Patient is well nourished, well developed, awake and alert, resting comfortably in no acute distress Head: Normocephalic and atraumatic Eyes: Normal inspection, extraocular muscles intact, no conjunctival pallor Ear, nose, throat: Normal external exam Neck: Normal range of motion Respiratory: Patient is in no respiratory distress, lungs CTAB Cardiovascular: Patient is not tachycardic, RRR without murmur appreciated GI: Abd SNT with no guarding or rebound  Back: Normal inspection of the back with good strength and range of motion throughout all ext Extremities: pulses intact with good cap refills, no LE pitting edema or calf tenderness Neuro: The patient is alert and  oriented to person, place, and time, appropriately conversive, with 5/5 bilat UE/LE strength, no gross motor or sensory defects noted. Coordination appears to be adequate. Skin: Warm, dry, and intact Psych: normal mood and affect, no SI or HI  ED Results / Procedures / Treatments   Labs (all labs ordered are listed, but only abnormal results are displayed) Labs Reviewed  COMPREHENSIVE METABOLIC PANEL WITH GFR - Abnormal; Notable for the following components:      Result Value   Glucose, Bld 113 (*)    Creatinine, Ser 1.21 (*)    AST 138 (*)    ALT 85 (*)    GFR, Estimated 42 (*)    All other components within normal limits  CBC - Abnormal; Notable for the following components:   MCV 105.2 (*)    All other components within normal limits  CBG MONITORING, ED - Abnormal; Notable for the following components:   Glucose-Capillary 115 (*)    All other components within normal limits  URINALYSIS, ROUTINE W REFLEX MICROSCOPIC     EKG   RADIOLOGY ***    PROCEDURES:  Critical Care performed: {CriticalCareYesNo:19197::Yes,  see critical care procedure note(s),No}  Procedures   MEDICATIONS ORDERED IN ED: Medications - No data to display   IMPRESSION / MDM / ASSESSMENT AND PLAN / ED COURSE                                Differential diagnosis includes, but is not limited to, ***    ***     -- Risk: 5 This patient has a high risk of morbidity due to further diagnostic testing or treatment. Rationale: This patients evaluation and management involve a high risk of morbidity due to the potential severity of presenting symptoms, need for diagnostic testing, and/or initiation of treatment that may require close monitoring. The differential includes conditions with potential for significant deterioration or requiring escalation of care. Treatment decisions in the ED, including medication administration, procedural interventions, or disposition planning, reflect this level  of risk. COPA: 5 The patient has the following acute or chronic illness/injury that poses a possible threat to life or bodily function: [X] : The patient has a potentially serious acute condition or an acute exacerbation of a chronic illness requiring urgent evaluation and management in the Emergency Department. The clinical presentation necessitates immediate consideration of life-threatening or function-threatening diagnoses, even if they are ultimately ruled out.   FINAL CLINICAL IMPRESSION(S) / ED DIAGNOSES   Final diagnoses:  None     Rx / DC Orders   ED Discharge Orders     None        Note:  This document was prepared using Dragon voice recognition software and may include unintentional dictation errors. "

## 2024-11-10 LAB — URINE CULTURE: Culture: >=100000 — AB

## 2024-11-17 ENCOUNTER — Encounter: Payer: Self-pay | Admitting: Internal Medicine

## 2024-11-17 ENCOUNTER — Inpatient Hospital Stay

## 2024-11-17 ENCOUNTER — Inpatient Hospital Stay: Attending: Internal Medicine

## 2024-11-17 ENCOUNTER — Inpatient Hospital Stay (HOSPITAL_BASED_OUTPATIENT_CLINIC_OR_DEPARTMENT_OTHER): Admitting: Internal Medicine

## 2024-11-17 VITALS — BP 124/87 | HR 67 | Temp 96.9°F | Resp 16 | Ht 59.0 in | Wt 131.2 lb

## 2024-11-17 DIAGNOSIS — E538 Deficiency of other specified B group vitamins: Secondary | ICD-10-CM | POA: Insufficient documentation

## 2024-11-17 DIAGNOSIS — N183 Chronic kidney disease, stage 3 unspecified: Secondary | ICD-10-CM

## 2024-11-17 DIAGNOSIS — N184 Chronic kidney disease, stage 4 (severe): Secondary | ICD-10-CM | POA: Insufficient documentation

## 2024-11-17 DIAGNOSIS — D649 Anemia, unspecified: Secondary | ICD-10-CM

## 2024-11-17 DIAGNOSIS — Z8639 Personal history of other endocrine, nutritional and metabolic disease: Secondary | ICD-10-CM | POA: Diagnosis not present

## 2024-11-17 DIAGNOSIS — D631 Anemia in chronic kidney disease: Secondary | ICD-10-CM | POA: Diagnosis not present

## 2024-11-17 LAB — BASIC METABOLIC PANEL WITH GFR
Anion gap: 11 (ref 5–15)
BUN: 18 mg/dL (ref 8–23)
CO2: 27 mmol/L (ref 22–32)
Calcium: 10.1 mg/dL (ref 8.9–10.3)
Chloride: 103 mmol/L (ref 98–111)
Creatinine, Ser: 1.34 mg/dL — ABNORMAL HIGH (ref 0.44–1.00)
GFR, Estimated: 37 mL/min — ABNORMAL LOW
Glucose, Bld: 103 mg/dL — ABNORMAL HIGH (ref 70–99)
Potassium: 4 mmol/L (ref 3.5–5.1)
Sodium: 142 mmol/L (ref 135–145)

## 2024-11-17 LAB — IRON AND TIBC
Iron: 86 ug/dL (ref 28–170)
Saturation Ratios: 22 % (ref 10.4–31.8)
TIBC: 391 ug/dL (ref 250–450)
UIBC: 305 ug/dL

## 2024-11-17 LAB — CBC WITH DIFFERENTIAL (CANCER CENTER ONLY)
Abs Immature Granulocytes: 0.03 K/uL (ref 0.00–0.07)
Basophils Absolute: 0.1 K/uL (ref 0.0–0.1)
Basophils Relative: 1 %
Eosinophils Absolute: 0.1 K/uL (ref 0.0–0.5)
Eosinophils Relative: 1 %
HCT: 42.6 % (ref 36.0–46.0)
Hemoglobin: 14.1 g/dL (ref 12.0–15.0)
Immature Granulocytes: 0 %
Lymphocytes Relative: 19 %
Lymphs Abs: 1.5 K/uL (ref 0.7–4.0)
MCH: 34.5 pg — ABNORMAL HIGH (ref 26.0–34.0)
MCHC: 33.1 g/dL (ref 30.0–36.0)
MCV: 104.2 fL — ABNORMAL HIGH (ref 80.0–100.0)
Monocytes Absolute: 0.5 K/uL (ref 0.1–1.0)
Monocytes Relative: 7 %
Neutro Abs: 5.7 K/uL (ref 1.7–7.7)
Neutrophils Relative %: 72 %
Platelet Count: 156 K/uL (ref 150–400)
RBC: 4.09 MIL/uL (ref 3.87–5.11)
RDW: 15.6 % — ABNORMAL HIGH (ref 11.5–15.5)
WBC Count: 7.9 K/uL (ref 4.0–10.5)
nRBC: 0 % (ref 0.0–0.2)

## 2024-11-17 LAB — VITAMIN B12: Vitamin B-12: 735 pg/mL (ref 180–914)

## 2024-11-17 LAB — FERRITIN: Ferritin: 69 ng/mL (ref 11–307)

## 2024-11-17 MED ORDER — CYANOCOBALAMIN 1000 MCG/ML IJ SOLN
1000.0000 ug | Freq: Once | INTRAMUSCULAR | Status: AC
  Administered 2024-11-17: 1000 ug via INTRAMUSCULAR
  Filled 2024-11-17: qty 1

## 2024-11-17 NOTE — Progress Notes (Signed)
 [facility] OFFICE PROGRESS NOTE  Patient Care Team: Glendia Shad, MD as PCP - General (Internal Medicine) Glendia Shad, MD (Internal Medicine) Ammon Blunt, MD (Internal Medicine) Jackquline Sawyer, MD (Specialist) Schnier, Cordella MATSU, MD (Vascular Surgery) Rennie Cindy SAUNDERS, MD as Consulting Physician (Oncology)   SUMMARY OF HEMATOLOGIC HISTORY:  # IRON DEFICIENCY ANEMIA- recurrent [? GI blood loss; Dr.Elliot; AVM-capsule]; IV Ferrahem q 24m; last colo- 2012/march; EGD- [id number].  July 2020 bone marrow biopsy-mild dysplastic changes; 40% hypercellularity; cytogenetics-normal.-Retacrit .MARCH 2023- Foundation One Hem- TP53 subclonal. B12 def- FEB 2023- Start B12 injections Monthly; MAY 3rd, 2023- Start Retacrit  20K [place].   #Colonoscopy-April 2020 aborted because of cardiac pause.  # CKD stage III-IV; CAD-Dr. Ammon;  March 30th, 2022-TIA/slurred spleech [Dr.Shah]; dec 2024- affecting right eye and memory.  INTERVAL HISTORY: In a wheelchair.  With her daughter.   75 -year-old female patient with above history of recurrent iron deficiency anemia unclear etiology/?-related to CKD/ A.fib- s/p watchman device OFF eliquis ; ;stroke affecting right eye and memory -on Feraheme /Retacrit  is here for follow-up.  Discussed the use of AI scribe software for clinical note transcription with the patient, who gave verbal consent to proceed.  History of Present Illness   Jocelyn Elliott is a 89 year old female with anemia of chronic kidney disease who presents for hematology follow-up to assess anemia status and persistent fatigue.  Recent laboratory evaluation shows improvement in hemoglobin to 14.1 g/dL, increased from a prior value of 13.7 g/dL. She is no longer taking apixaban  and has not required transfusions or parenteral therapy for anemia. She denies acute hematologic symptoms.  She continues to experience persistent fatigue, which she attributes in part to a  recent episode of herpes zoster involving her back and side. She has not received additional treatments.       Review of Systems  Constitutional:  Positive for malaise/fatigue. Negative for chills, diaphoresis, fever and weight loss.  HENT:  Negative for nosebleeds and sore throat.   Eyes:  Negative for double vision.  Respiratory:  Negative for cough, hemoptysis, sputum production and wheezing.   Cardiovascular:  Negative for palpitations, orthopnea and leg swelling.  Gastrointestinal:  Negative for abdominal pain, blood in stool, constipation, diarrhea, heartburn, melena, nausea and vomiting.  Genitourinary:  Negative for dysuria, frequency and urgency.  Musculoskeletal:  Negative for back pain and joint pain.  Skin: Negative.  Negative for itching and rash.  Neurological:  Negative for dizziness, tingling, focal weakness, weakness and headaches.  Endo/Heme/Allergies:  Does not bruise/bleed easily.  Psychiatric/Behavioral:  Negative for depression. The patient is not nervous/anxious and does not have insomnia.      PAST MEDICAL HISTORY :  Past Medical History:  Diagnosis Date   Arthritis    Atrial fibrillation (HCC)    AV malformation of gastrointestinal tract    Basal cell carcinoma 08/07/2023   Central upper forehead. Nodular pattern. Mohs 09/09/23   Blood in stool    CAD (coronary artery disease)    Carotid arterial disease    Cataracts, bilateral    Chronic blood loss anemia    Chronic cystitis    CKD (chronic kidney disease), stage III (HCC)    Complication of anesthesia    reaction to propofol  - caused heart to pause   Depression    Diabetes mellitus (HCC)    GERD (gastroesophageal reflux disease)    Hyperlipidemia    Hypertension    IDA (iron deficiency anemia)    Stress incontinence  TIA (transient ischemic attack) 03/2021   No deficits   Wears dentures    full upper and lower    PAST SURGICAL HISTORY :   Past Surgical History:  Procedure Laterality  Date   ABDOMINAL HYSTERECTOMY  11/19/1970   ovaries left in place   APPENDECTOMY  11/19/1990   BACK SURGERY  06/08/2010   L3, L4, L5   CARDIAC CATHETERIZATION  02/2001   CAROTID ARTERY ANGIOPLASTY  09/19/2002   CARPAL TUNNEL RELEASE  1991, 92   right then left    CATARACT EXTRACTION W/PHACO Left 02/13/2022   Procedure: CATARACT EXTRACTION PHACO AND INTRAOCULAR LENS PLACEMENT (IOC) LEFT DIABETIC 14.90 01:26.8;  Surgeon: [name], MD;  Location: [facility];  Service: Ophthalmology;  Laterality: Left;  Diabetic   CATARACT EXTRACTION W/PHACO Right 03/06/2022   Procedure: CATARACT EXTRACTION PHACO AND INTRAOCULAR LENS PLACEMENT (IOC) RIGHT DIABETIC 9.66 00:53.7;  Surgeon: [name], MD;  Location: [facility];  Service: Ophthalmology;  Laterality: Right;  Diabetic   CHOLECYSTECTOMY  11/20/1991   COLONOSCOPY WITH PROPOFOL  N/A 02/02/2019   Procedure: COLONOSCOPY WITH PROPOFOL ;  Surgeon: [name], MD;  Location: [facility] END[facility];  Service: Endoscopy;  Laterality: N/A;   ESOPHAGOGASTRODUODENOSCOPY N/A 02/02/2019   Procedure: ESOPHAGOGASTRODUODENOSCOPY (EGD);  Surgeon: [name], MD;  Location: [facility] ENDOSCOPY;  Service: Endoscopy;  Laterality: N/A;   FOOT SURGERY  06/20/2007   Left   LEFT ATRIAL APPENDAGE OCCLUSION Left 02/26/2024   right carotid artery surgery  01/09/2013   Dr. Jama @ [facility]   TOTAL HIP ARTHROPLASTY  05/03/2011   left 12, right 11/07    FAMILY HISTORY :   Family History  Problem Relation Age of Onset   Hyperlipidemia Father    Heart disease Father        myocardial infarction   Hypertension Father        Parent   Arthritis Other        parent   Diabetes Other        nephew   Cervical cancer Sister    Rectal cancer Sister    Breast cancer Neg Hx     SOCIAL HISTORY:   Social History   Tobacco Use   Smoking status: Former    Current packs/day: 0.00    Types: Cigarettes    Quit date: 11/19/1989    Years since  quitting: 35.0   Smokeless tobacco: Never  Vaping Use   Vaping status: Never Used  Substance Use Topics   Alcohol use: No    Alcohol/week: 0.0 standard drinks of alcohol   Drug use: No    ALLERGIES:  is allergic to gabapentin , propofol , ciprofloxacin, levaquin [levofloxacin], and tequin [gatifloxacin].  MEDICATIONS:  Current Outpatient Medications  Medication Sig Dispense Refill   albuterol  (VENTOLIN  HFA) 108 (90 Base) MCG/ACT inhaler Inhale 2 puffs into the lungs every 6 (six) hours as needed for wheezing or shortness of breath. 18 g 1   Biotin  5000 MCG CAPS Take 1 capsule by mouth daily.     Blood Glucose Monitoring Suppl DEVI Use to check blood sugars once daily. 1 each 0   cetirizine (ZYRTEC) 10 MG tablet Take 10 mg by mouth daily.     Cholecalciferol (VITAMIN D -3) 25 MCG (1000 UT) CAPS Take 1,000 Units by mouth daily.     docusate sodium  (COLACE) 100 MG capsule Take 100 mg by mouth daily as needed for mild constipation. (Patient taking differently: Take 100 mg by mouth daily.)  ferrous sulfate  325 (65 FE) MG EC tablet Take 325 mg by mouth every other day. Every other day     Glucose Blood (BLOOD GLUCOSE TEST STRIPS) STRP Use to check blood sugars once daily. 100 strip 12   hydrocortisone  2.5 % cream apply topically 2 times daily as needed for itch at right lower leg 30 g 5   Lancet Device MISC Use to check blood sugars once daily. 1 each 0   Lancets (ONETOUCH DELICA PLUS LANCET30G) MISC USE AS INSTRUCTED TO CHECK BLOOD SUGARS ONCE DAILY. DX E 11.9 100 each 12   Lancets Misc. MISC Use to check blood sugars once daily. 100 each 12   metoprolol  succinate (TOPROL -XL) 25 MG 24 hr tablet Take 12.5 mg by mouth daily.     Omega-3 Fatty Acids (FISH OIL) 1200 MG CAPS Take 1 capsule by mouth daily.     Probiotic, Lactobacillus, CAPS Take 1 capsule by mouth daily.     rosuvastatin  (CRESTOR ) 40 MG tablet TAKE 1 TABLET BY MOUTH EVERY DAY 90 tablet 1   traZODone  (DESYREL ) 50 MG tablet TAKE  1/2 TO 1 TABLET BY MOUTH AT BEDTIME AS NEEDED FOR SLEEP (Patient not taking: Reported on 11/17/2024) 90 tablet 1   No current facility-administered medications for this visit.    PHYSICAL EXAMINATION:   BP 124/87 (BP Location: Left Arm, Patient Position: Sitting, Cuff Size: Normal)   Pulse 67   Temp (!) 96.9 F (36.1 C) (Tympanic)   Resp 16   Ht 4' 11 (1.499 m)   Wt 131 lb 3.2 oz (59.5 kg)   SpO2 98%   BMI 26.50 kg/m   Filed Weights   11/17/24 1454  Weight: 131 lb 3.2 oz (59.5 kg)    Physical Exam Constitutional:      Comments: Frail-appearing Caucasian female patient.  She is in a wheelchair.  HENT:     Head: Normocephalic and atraumatic.     Mouth/Throat:     Pharynx: No oropharyngeal exudate.  Eyes:     Pupils: Pupils are equal, round, and reactive to light.     Comments: Positive for pallor.  Cardiovascular:     Rate and Rhythm: Normal rate and regular rhythm.     Heart sounds: Murmur heard.  Pulmonary:     Effort: No respiratory distress.     Breath sounds: No wheezing.  Abdominal:     General: Bowel sounds are normal. There is no distension.     Palpations: Abdomen is soft. There is no mass.     Tenderness: There is no abdominal tenderness. There is no guarding or rebound.  Musculoskeletal:        General: No tenderness. Normal range of motion.     Cervical back: Normal range of motion and neck supple.  Skin:    General: Skin is warm.  Neurological:     Mental Status: She is alert and oriented to person, place, and time.  Psychiatric:        Mood and Affect: Affect normal.     LABORATORY DATA:  I have reviewed the data as listed    Component Value Date/Time   NA 142 11/17/2024 1445   NA 143 01/10/2013 0314   K 4.0 11/17/2024 1445   K 4.0 01/10/2013 0314   CL 103 11/17/2024 1445   CL 109 (H) 01/10/2013 0314   CO2 27 11/17/2024 1445   CO2 27 01/10/2013 0314   GLUCOSE 103 (H) 11/17/2024 1445   GLUCOSE 119 (H) 01/10/2013  0314   BUN 18  11/17/2024 1445   BUN 15 01/10/2013 0314   CREATININE 1.34 (H) 11/17/2024 1445   CREATININE 1.18 (H) 07/27/2024 1429   CREATININE 1.03 01/10/2013 0314   CREATININE 1.13 (H) 09/18/2012 0848   CALCIUM  10.1 11/17/2024 1445   CALCIUM  8.1 (L) 01/10/2013 0314   PROT 6.9 11/07/2024 1432   PROT 7.2 12/20/2011 1521   ALBUMIN 4.2 11/07/2024 1432   ALBUMIN 3.9 12/20/2011 1521   AST 138 (H) 11/07/2024 1432   AST 28 07/27/2024 1429   ALT 85 (H) 11/07/2024 1432   ALT 27 07/27/2024 1429   ALT 37 12/20/2011 1521   ALKPHOS 61 11/07/2024 1432   ALKPHOS 57 12/20/2011 1521   BILITOT 0.5 11/07/2024 1432   BILITOT 0.8 07/27/2024 1429   GFRNONAA 37 (L) 11/17/2024 1445   GFRNONAA 44 (L) 07/27/2024 1429   GFRNONAA 52 (L) 01/10/2013 0314   GFRNONAA 47 (L) 09/18/2012 0848   GFRAA 46 (L) 07/12/2020 1240   GFRAA >60 01/10/2013 0314   GFRAA 54 (L) 09/18/2012 0848    No results found for: SPEP, UPEP  Lab Results  Component Value Date   WBC 7.9 11/17/2024   NEUTROABS 5.7 11/17/2024   HGB 14.1 11/17/2024   HCT 42.6 11/17/2024   MCV 104.2 (H) 11/17/2024   PLT 156 11/17/2024      Chemistry      Component Value Date/Time   NA 142 11/17/2024 1445   NA 143 01/10/2013 0314   K 4.0 11/17/2024 1445   K 4.0 01/10/2013 0314   CL 103 11/17/2024 1445   CL 109 (H) 01/10/2013 0314   CO2 27 11/17/2024 1445   CO2 27 01/10/2013 0314   BUN 18 11/17/2024 1445   BUN 15 01/10/2013 0314   CREATININE 1.34 (H) 11/17/2024 1445   CREATININE 1.18 (H) 07/27/2024 1429   CREATININE 1.03 01/10/2013 0314   CREATININE 1.13 (H) 09/18/2012 0848      Component Value Date/Time   CALCIUM  10.1 11/17/2024 1445   CALCIUM  8.1 (L) 01/10/2013 0314   ALKPHOS 61 11/07/2024 1432   ALKPHOS 57 12/20/2011 1521   AST 138 (H) 11/07/2024 1432   AST 28 07/27/2024 1429   ALT 85 (H) 11/07/2024 1432   ALT 27 07/27/2024 1429   ALT 37 12/20/2011 1521   BILITOT 0.5 11/07/2024 1432   BILITOT 0.8 07/27/2024 1429      ASSESSMENT &  PLAN:   Anemia of chronic kidney failure, stage 3 (moderate) (HCC) #Severe anemia hemoglobin-nadir 6 [summer 2020]. 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 JUNE 2023- NEGATIVE for hemolysis [haptoglobin-WNL]  # Today hemoglobin is  14.1- continue to HOLD Retacrit  today. ALSO HOLD Ferrahem-Stable.    # B12 def- continue B12 injections. injection today. Stable.    # CKD stage III- Recommend continued p.o. intake. Stable.    # A.fib/Stroke [Dec 2024] OFF plavix  [Dr.Shah/NP] Dr.Thomas s/p watchman device-[ APRIL 2025]- OFF off eliquis  soon.    Monthly B12; Ferra/retacrit  q m  # DISPOSITION:  # HOLD Ferrahem today;   # HOLD RETACRIT ;   # ok to give B 12 injection today # in 3  month  MD-  labs- -cbc;bmp;iron studies;ferriitn; b12 - - possible Ferrahem or possible retacrit ; B12 injection- Dr.B   Cindy JONELLE Joe, MD 11/17/2024 3:39 PM

## 2024-11-17 NOTE — Assessment & Plan Note (Addendum)
#  Severe anemia hemoglobin-nadir 6 [summer 2020]. 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 JUNE 2023- NEGATIVE for hemolysis [haptoglobin-WNL]  # Today hemoglobin is  14.1- continue to HOLD Retacrit  today. ALSO HOLD Ferrahem-Stable.    # B12 def- continue B12 injections. injection today. Stable.    # CKD stage III- Recommend continued p.o. intake. Stable.    # A.fib/Stroke [Dec 2024] OFF plavix  [Dr.Shah/NP] Dr.Thomas s/p watchman device-[ APRIL 2025]- OFF off eliquis  soon.    Monthly B12; Ferra/retacrit  q m  # DISPOSITION:  # HOLD Ferrahem today;   # HOLD RETACRIT ;   # ok to give B 12 injection today # in 3  month  MD-  labs- -cbc;bmp;iron studies;ferriitn; b12 - - possible Ferrahem or possible retacrit ; B12 injection- Dr.B

## 2024-11-17 NOTE — Progress Notes (Signed)
 Fatigue/weakness: YES Dyspena: NO Light headedness: NO Blood in stool: NO   Broke out with shingles 10/24/24 on her back, rt neck, jaw and chest. Then developed a sty on her rt eye. Her daughter states she can see a decline in her mentally.

## 2024-12-14 ENCOUNTER — Telehealth (INDEPENDENT_AMBULATORY_CARE_PROVIDER_SITE_OTHER): Admitting: Internal Medicine

## 2024-12-14 ENCOUNTER — Encounter: Payer: Self-pay | Admitting: Internal Medicine

## 2024-12-14 VITALS — BP 105/60 | HR 88 | Ht 59.0 in | Wt 131.0 lb

## 2024-12-14 DIAGNOSIS — Z8673 Personal history of transient ischemic attack (TIA), and cerebral infarction without residual deficits: Secondary | ICD-10-CM

## 2024-12-14 DIAGNOSIS — I10 Essential (primary) hypertension: Secondary | ICD-10-CM

## 2024-12-14 DIAGNOSIS — R600 Localized edema: Secondary | ICD-10-CM

## 2024-12-14 DIAGNOSIS — D649 Anemia, unspecified: Secondary | ICD-10-CM

## 2024-12-14 DIAGNOSIS — I779 Disorder of arteries and arterioles, unspecified: Secondary | ICD-10-CM

## 2024-12-14 DIAGNOSIS — N1832 Chronic kidney disease, stage 3b: Secondary | ICD-10-CM | POA: Diagnosis not present

## 2024-12-14 DIAGNOSIS — I2510 Atherosclerotic heart disease of native coronary artery without angina pectoris: Secondary | ICD-10-CM

## 2024-12-14 DIAGNOSIS — E1159 Type 2 diabetes mellitus with other circulatory complications: Secondary | ICD-10-CM

## 2024-12-14 DIAGNOSIS — I480 Paroxysmal atrial fibrillation: Secondary | ICD-10-CM

## 2024-12-14 DIAGNOSIS — E7800 Pure hypercholesterolemia, unspecified: Secondary | ICD-10-CM

## 2024-12-14 NOTE — Progress Notes (Unsigned)
 Patient ID: Jocelyn Elliott, female   DOB: 08-Jun-1932, 89 y.o.   MRN: [id number]   Virtual Visit via video Note  I connected with Jocelyn Elliott by a video enabled telemedicine application and verified that I am speaking with the correct person using two identifiers. Location patient: home Location provider: home Persons participating in the virtual visit: patient, provider  The limitations, risks, security and privacy concerns of performing an evaluation and management service by video and the availability of in person appointments have been discussed. It has also been discussed with the patient that there may be a patient responsible charge related to this service. The patient expressed understanding and agreed to proceed.  Interactive audio and video telecommunications were attempted between this provider and patient, however failed, due to patient having technical difficulties OR patient did not have access to video capability.  We continued and completed visit with audio only. ***  Reason for visit: follow up appt.   HPI: Follow up regarding CKD, diabetes, hypertension and hypercholesterolemia. S/p watchman implant 02/27/24 - stable. Continue metoprolol .    ROS: See pertinent positives and negatives per HPI.  Past Medical History:  Diagnosis Date   Arthritis    Atrial fibrillation (HCC)    AV malformation of gastrointestinal tract    Basal cell carcinoma 08/07/2023   Central upper forehead. Nodular pattern. Mohs 09/09/23   Blood in stool    CAD (coronary artery disease)    Carotid arterial disease    Cataracts, bilateral    Chronic blood loss anemia    Chronic cystitis    CKD (chronic kidney disease), stage III (HCC)    Complication of anesthesia    reaction to propofol  - caused heart to pause   Depression    Diabetes mellitus (HCC)    GERD (gastroesophageal reflux disease)    Hyperlipidemia    Hypertension    IDA (iron deficiency anemia)    Stress incontinence    TIA  (transient ischemic attack) 03/2021   No deficits   Wears dentures    full upper and lower    Past Surgical History:  Procedure Laterality Date   ABDOMINAL HYSTERECTOMY  11/19/1970   ovaries left in place   APPENDECTOMY  11/19/1990   BACK SURGERY  06/08/2010   L3, L4, L5   CARDIAC CATHETERIZATION  02/2001   CAROTID ARTERY ANGIOPLASTY  09/19/2002   CARPAL TUNNEL RELEASE  1991, 92   right then left    CATARACT EXTRACTION W/PHACO Left 02/13/2022   Procedure: CATARACT EXTRACTION PHACO AND INTRAOCULAR LENS PLACEMENT (IOC) LEFT DIABETIC 14.90 01:26.8;  Surgeon: [name], MD;  Location: [facility];  Service: Ophthalmology;  Laterality: Left;  Diabetic   CATARACT EXTRACTION W/PHACO Right 03/06/2022   Procedure: CATARACT EXTRACTION PHACO AND INTRAOCULAR LENS PLACEMENT (IOC) RIGHT DIABETIC 9.66 00:53.7;  Surgeon: [name], MD;  Location: [facility];  Service: Ophthalmology;  Laterality: Right;  Diabetic   CHOLECYSTECTOMY  11/20/1991   COLONOSCOPY WITH PROPOFOL  N/A 02/02/2019   Procedure: COLONOSCOPY WITH PROPOFOL ;  Surgeon: [name], MD;  Location: [facility] END[facility]COPY;  Service: Endoscopy;  Laterality: N/A;   ESOPHAGOGASTRODUODENOSCOPY N/A 02/02/2019   Procedure: ESOPHAGOGASTRODUODENOSCOPY (EGD);  Surgeon: [name], MD;  Location: [facility] END[facility];  Service: Endoscopy;  Laterality: N/A;   FOOT SURGERY  06/20/2007   Left   LEFT ATRIAL APPENDAGE OCCLUSION Left 02/26/2024   right carotid artery surgery  01/09/2013   Dr. Jama @ [facility]   TOTAL HIP ARTHROPLASTY  05/03/2011  left 12, right 11/07    Family History  Problem Relation Age of Onset   Hyperlipidemia Father    Heart disease Father        myocardial infarction   Hypertension Father        Parent   Arthritis Other        parent   Diabetes Other        nephew   Cervical cancer Sister    Rectal cancer Sister    Breast cancer Neg Hx     SOCIAL HX: ***  Current  Medications[1]  EXAM:  VITALS per patient if applicable:  GENERAL: alert, oriented, appears well and in no acute distress  HEENT: atraumatic, conjunttiva clear, no obvious abnormalities on inspection of external nose and ears  NECK: normal movements of the head and neck  LUNGS: on inspection no signs of respiratory distress, breathing rate appears normal, no obvious gross SOB, gasping or wheezing  CV: no obvious cyanosis  MS: moves all visible extremities without noticeable abnormality  PSYCH/NEURO: pleasant and cooperative, no obvious depression or anxiety, speech and thought processing grossly intact  ASSESSMENT AND PLAN:  Discussed the following assessment and plan:  Problem List Items Addressed This Visit   None   No follow-ups on file.   I discussed the assessment and treatment plan with the patient. The patient was provided an opportunity to ask questions and all were answered. The patient agreed with the plan and demonstrated an understanding of the instructions.   The patient was advised to call back or seek an in-person evaluation if the symptoms worsen or if the condition fails to improve as anticipated.  I provided *** minutes of non-face-to-face time during this encounter.   Allena Hamilton, MD      [1]  Current Outpatient Medications:    albuterol  (VENTOLIN  HFA) 108 (90 Base) MCG/ACT inhaler, Inhale 2 puffs into the lungs every 6 (six) hours as needed for wheezing or shortness of breath., Disp: 18 g, Rfl: 1   Biotin  5000 MCG CAPS, Take 1 capsule by mouth daily., Disp: , Rfl:    Blood Glucose Monitoring Suppl DEVI, Use to check blood sugars once daily., Disp: 1 each, Rfl: 0   cetirizine (ZYRTEC) 10 MG tablet, Take 10 mg by mouth daily., Disp: , Rfl:    Cholecalciferol (VITAMIN D -3) 25 MCG (1000 UT) CAPS, Take 1,000 Units by mouth daily., Disp: , Rfl:    docusate sodium  (COLACE) 100 MG capsule, Take 100 mg by mouth daily as needed for mild constipation.  (Patient taking differently: Take 100 mg by mouth daily.), Disp: , Rfl:    ferrous sulfate  325 (65 FE) MG EC tablet, Take 325 mg by mouth every other day. Every other day, Disp: , Rfl:    Glucose Blood (BLOOD GLUCOSE TEST STRIPS) STRP, Use to check blood sugars once daily., Disp: 100 strip, Rfl: 12   hydrocortisone  2.5 % cream, apply topically 2 times daily as needed for itch at right lower leg, Disp: 30 g, Rfl: 5   Lancet Device MISC, Use to check blood sugars once daily., Disp: 1 each, Rfl: 0   Lancets (ONETOUCH DELICA PLUS LANCET30G) MISC, USE AS INSTRUCTED TO CHECK BLOOD SUGARS ONCE DAILY. DX E 11.9, Disp: 100 each, Rfl: 12   Lancets Misc. MISC, Use to check blood sugars once daily., Disp: 100 each, Rfl: 12   metoprolol  succinate (TOPROL -XL) 25 MG 24 hr tablet, Take 12.5 mg by mouth daily., Disp: , Rfl:  Omega-3 Fatty Acids (FISH OIL) 1200 MG CAPS, Take 1 capsule by mouth daily., Disp: , Rfl:    Probiotic, Lactobacillus, CAPS, Take 1 capsule by mouth daily., Disp: , Rfl:    rosuvastatin  (CRESTOR ) 40 MG tablet, TAKE 1 TABLET BY MOUTH EVERY DAY, Disp: 90 tablet, Rfl: 1   traZODone  (DESYREL ) 50 MG tablet, TAKE 1/2 TO 1 TABLET BY MOUTH AT BEDTIME AS NEEDED FOR SLEEP (Patient not taking: Reported on 11/17/2024), Disp: 90 tablet, Rfl: 1

## 2024-12-15 ENCOUNTER — Telehealth: Payer: Self-pay | Admitting: Internal Medicine

## 2024-12-15 ENCOUNTER — Encounter: Payer: Self-pay | Admitting: Internal Medicine

## 2024-12-15 NOTE — Assessment & Plan Note (Signed)
Continue crestor.  Low cholesterol diet and exercise.  Follow lipid panel.  

## 2024-12-15 NOTE — Assessment & Plan Note (Addendum)
"   Continue antiplatelet therapy as prescribed Continue management of CAD, HTN and Hyperlipidemia Evaluated [facility] 08/26/23 -ultrasound shows RICA 1-39% and LICA 40-59% stenosis bilaterally s/p left CEA.  Recommended f/u in 12 months.  Recent CTA (during admission) as outlined. Continue crestor .  Confirm f/u scheduled.  "

## 2024-12-15 NOTE — Assessment & Plan Note (Signed)
 Follow met b and A1c. Sugars as outlined.  Lab Results  Component Value Date   HGBA1C 6.4 08/11/2024

## 2024-12-15 NOTE — Assessment & Plan Note (Signed)
 Followed by hematology.  History of AVM.  Also with CKD.  On eliquis .Had f/u with Dr Rennie  - recent hematology visit - hgb 14.1. received b12 injection.. Also hold ferrahem. Continue B12 injections

## 2024-12-15 NOTE — Assessment & Plan Note (Signed)
 Continue eliquis . Continue crestor  and risk factor modification. S/p watchman implant 02/2024.

## 2024-12-15 NOTE — Assessment & Plan Note (Signed)
 Eliquis . 02/27/24 - ([facility]) - watchman implant. Stable. Continues on metoprolol .

## 2024-12-15 NOTE — Assessment & Plan Note (Signed)
 Continue to avoid antiinflammatories.  Stay hydrated.  Off ace inhibitor due to low blood pressure. Follow metabolic panel.

## 2024-12-15 NOTE — Assessment & Plan Note (Signed)
 Continue metoprolol.  Follow pressures.  Follow metabolic panel.

## 2024-12-15 NOTE — Telephone Encounter (Signed)
Please schedule 3 month f/u appt. Thanks

## 2024-12-15 NOTE — Assessment & Plan Note (Signed)
 Discussed leg elevation. Follow.

## 2024-12-15 NOTE — Assessment & Plan Note (Signed)
 Remains on crestor .  Continue risk factor modifications. Stable.

## 2024-12-23 ENCOUNTER — Other Ambulatory Visit: Payer: Self-pay | Admitting: Internal Medicine

## 2025-02-15 ENCOUNTER — Inpatient Hospital Stay

## 2025-02-15 ENCOUNTER — Inpatient Hospital Stay: Admitting: Internal Medicine

## 2025-03-16 ENCOUNTER — Ambulatory Visit: Admitting: Internal Medicine

## 2025-08-23 ENCOUNTER — Ambulatory Visit (INDEPENDENT_AMBULATORY_CARE_PROVIDER_SITE_OTHER): Admitting: Vascular Surgery

## 2025-08-23 ENCOUNTER — Encounter (INDEPENDENT_AMBULATORY_CARE_PROVIDER_SITE_OTHER)
